# Patient Record
Sex: Female | Born: 1980 | Race: White | Hispanic: No | Marital: Single | Smoking: Current every day smoker
Health system: Southern US, Community
[De-identification: ages and names within clinical notes are randomized; demographics above are authoritative.]

## PROBLEM LIST (undated history)

## (undated) ENCOUNTER — Inpatient Hospital Stay (HOSPITAL_COMMUNITY): Payer: Self-pay

## (undated) DIAGNOSIS — O021 Missed abortion: Principal | ICD-10-CM

## (undated) DIAGNOSIS — R45851 Suicidal ideations: Secondary | ICD-10-CM

## (undated) DIAGNOSIS — I10 Essential (primary) hypertension: Secondary | ICD-10-CM

## (undated) DIAGNOSIS — Z87442 Personal history of urinary calculi: Secondary | ICD-10-CM

## (undated) DIAGNOSIS — R87629 Unspecified abnormal cytological findings in specimens from vagina: Secondary | ICD-10-CM

## (undated) DIAGNOSIS — T8859XA Other complications of anesthesia, initial encounter: Secondary | ICD-10-CM

## (undated) DIAGNOSIS — R51 Headache: Secondary | ICD-10-CM

## (undated) DIAGNOSIS — F32A Depression, unspecified: Secondary | ICD-10-CM

## (undated) DIAGNOSIS — D649 Anemia, unspecified: Secondary | ICD-10-CM

## (undated) DIAGNOSIS — N200 Calculus of kidney: Secondary | ICD-10-CM

## (undated) DIAGNOSIS — J449 Chronic obstructive pulmonary disease, unspecified: Secondary | ICD-10-CM

## (undated) DIAGNOSIS — O139 Gestational [pregnancy-induced] hypertension without significant proteinuria, unspecified trimester: Secondary | ICD-10-CM

## (undated) DIAGNOSIS — M1990 Unspecified osteoarthritis, unspecified site: Secondary | ICD-10-CM

## (undated) DIAGNOSIS — R519 Headache, unspecified: Secondary | ICD-10-CM

## (undated) DIAGNOSIS — K219 Gastro-esophageal reflux disease without esophagitis: Secondary | ICD-10-CM

## (undated) DIAGNOSIS — N83209 Unspecified ovarian cyst, unspecified side: Secondary | ICD-10-CM

## (undated) DIAGNOSIS — J189 Pneumonia, unspecified organism: Secondary | ICD-10-CM

## (undated) DIAGNOSIS — F329 Major depressive disorder, single episode, unspecified: Secondary | ICD-10-CM

## (undated) DIAGNOSIS — F419 Anxiety disorder, unspecified: Secondary | ICD-10-CM

## (undated) HISTORY — PX: TUBAL LIGATION: SHX77

## (undated) HISTORY — PX: TONSILLECTOMY: SUR1361

## (undated) HISTORY — PX: FOOT SURGERY: SHX648

## (undated) HISTORY — PX: DILATION AND CURETTAGE OF UTERUS: SHX78

---
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
## 2002-08-17 ENCOUNTER — Emergency Department (HOSPITAL_COMMUNITY): Admission: EM | Admit: 2002-08-17 | Discharge: 2002-08-17 | Payer: Self-pay | Admitting: Emergency Medicine

## 2003-10-26 ENCOUNTER — Emergency Department (HOSPITAL_COMMUNITY): Admission: EM | Admit: 2003-10-26 | Discharge: 2003-10-26 | Payer: Self-pay | Admitting: Emergency Medicine

## 2004-02-17 ENCOUNTER — Emergency Department (HOSPITAL_COMMUNITY): Admission: EM | Admit: 2004-02-17 | Discharge: 2004-02-17 | Payer: Self-pay | Admitting: Emergency Medicine

## 2004-11-13 ENCOUNTER — Emergency Department (HOSPITAL_COMMUNITY): Admission: EM | Admit: 2004-11-13 | Discharge: 2004-11-13 | Payer: Self-pay | Admitting: Emergency Medicine

## 2006-02-18 ENCOUNTER — Ambulatory Visit: Payer: Self-pay | Admitting: Family Medicine

## 2006-07-14 ENCOUNTER — Emergency Department (HOSPITAL_COMMUNITY): Admission: EM | Admit: 2006-07-14 | Discharge: 2006-07-14 | Payer: Self-pay | Admitting: Emergency Medicine

## 2006-07-16 ENCOUNTER — Ambulatory Visit (HOSPITAL_COMMUNITY): Admission: RE | Admit: 2006-07-16 | Discharge: 2006-07-16 | Payer: Self-pay | Admitting: Obstetrics and Gynecology

## 2007-02-04 ENCOUNTER — Encounter (INDEPENDENT_AMBULATORY_CARE_PROVIDER_SITE_OTHER): Payer: Self-pay | Admitting: *Deleted

## 2007-10-15 ENCOUNTER — Inpatient Hospital Stay (HOSPITAL_COMMUNITY): Admission: AD | Admit: 2007-10-15 | Discharge: 2007-10-15 | Payer: Self-pay | Admitting: Obstetrics and Gynecology

## 2007-12-22 ENCOUNTER — Inpatient Hospital Stay (HOSPITAL_COMMUNITY): Admission: AD | Admit: 2007-12-22 | Discharge: 2007-12-22 | Payer: Self-pay | Admitting: Obstetrics and Gynecology

## 2007-12-28 ENCOUNTER — Inpatient Hospital Stay (HOSPITAL_COMMUNITY): Admission: AD | Admit: 2007-12-28 | Discharge: 2008-01-03 | Payer: Self-pay | Admitting: Obstetrics and Gynecology

## 2007-12-31 ENCOUNTER — Encounter (INDEPENDENT_AMBULATORY_CARE_PROVIDER_SITE_OTHER): Payer: Self-pay | Admitting: Obstetrics and Gynecology

## 2008-07-27 ENCOUNTER — Emergency Department (HOSPITAL_COMMUNITY): Admission: EM | Admit: 2008-07-27 | Discharge: 2008-07-27 | Payer: Self-pay | Admitting: Emergency Medicine

## 2008-09-09 ENCOUNTER — Emergency Department (HOSPITAL_COMMUNITY): Admission: EM | Admit: 2008-09-09 | Discharge: 2008-09-09 | Payer: Self-pay | Admitting: Family Medicine

## 2009-01-11 ENCOUNTER — Emergency Department (HOSPITAL_COMMUNITY): Admission: EM | Admit: 2009-01-11 | Discharge: 2009-01-11 | Payer: Self-pay | Admitting: Family Medicine

## 2009-03-10 ENCOUNTER — Emergency Department (HOSPITAL_COMMUNITY): Admission: EM | Admit: 2009-03-10 | Discharge: 2009-03-10 | Payer: Self-pay | Admitting: Family Medicine

## 2009-05-05 ENCOUNTER — Emergency Department (HOSPITAL_COMMUNITY): Admission: EM | Admit: 2009-05-05 | Discharge: 2009-05-05 | Payer: Self-pay | Admitting: Emergency Medicine

## 2009-05-22 ENCOUNTER — Emergency Department (HOSPITAL_COMMUNITY): Admission: EM | Admit: 2009-05-22 | Discharge: 2009-05-22 | Payer: Self-pay | Admitting: Emergency Medicine

## 2009-07-11 ENCOUNTER — Emergency Department (HOSPITAL_COMMUNITY): Admission: EM | Admit: 2009-07-11 | Discharge: 2009-07-11 | Payer: Self-pay | Admitting: Family Medicine

## 2009-07-17 ENCOUNTER — Emergency Department (HOSPITAL_COMMUNITY): Admission: EM | Admit: 2009-07-17 | Discharge: 2009-07-17 | Payer: Self-pay | Admitting: Emergency Medicine

## 2009-07-28 ENCOUNTER — Emergency Department (HOSPITAL_COMMUNITY): Admission: EM | Admit: 2009-07-28 | Discharge: 2009-07-28 | Payer: Self-pay | Admitting: Emergency Medicine

## 2009-09-20 ENCOUNTER — Emergency Department (HOSPITAL_COMMUNITY): Admission: EM | Admit: 2009-09-20 | Discharge: 2009-09-20 | Payer: Self-pay | Admitting: Emergency Medicine

## 2010-01-01 ENCOUNTER — Emergency Department (HOSPITAL_COMMUNITY): Admission: EM | Admit: 2010-01-01 | Discharge: 2010-01-01 | Payer: Self-pay | Admitting: Emergency Medicine

## 2010-06-05 ENCOUNTER — Emergency Department (HOSPITAL_COMMUNITY)
Admission: EM | Admit: 2010-06-05 | Discharge: 2010-06-05 | Payer: Self-pay | Source: Home / Self Care | Admitting: Family Medicine

## 2010-07-10 ENCOUNTER — Emergency Department (HOSPITAL_COMMUNITY)
Admission: EM | Admit: 2010-07-10 | Discharge: 2010-07-10 | Disposition: A | Discharge status: Home / Self Care | Payer: Self-pay | Attending: Emergency Medicine | Admitting: Emergency Medicine

## 2010-07-10 DIAGNOSIS — R05 Cough: Secondary | ICD-10-CM | POA: Insufficient documentation

## 2010-07-10 DIAGNOSIS — R0602 Shortness of breath: Secondary | ICD-10-CM | POA: Insufficient documentation

## 2010-07-10 DIAGNOSIS — J029 Acute pharyngitis, unspecified: Secondary | ICD-10-CM | POA: Insufficient documentation

## 2010-07-10 DIAGNOSIS — H9209 Otalgia, unspecified ear: Secondary | ICD-10-CM | POA: Insufficient documentation

## 2010-07-10 DIAGNOSIS — R059 Cough, unspecified: Secondary | ICD-10-CM | POA: Insufficient documentation

## 2010-07-10 DIAGNOSIS — J45909 Unspecified asthma, uncomplicated: Secondary | ICD-10-CM | POA: Insufficient documentation

## 2010-07-10 DIAGNOSIS — I10 Essential (primary) hypertension: Secondary | ICD-10-CM | POA: Insufficient documentation

## 2010-07-10 DIAGNOSIS — J069 Acute upper respiratory infection, unspecified: Secondary | ICD-10-CM | POA: Insufficient documentation

## 2010-07-10 DIAGNOSIS — IMO0001 Reserved for inherently not codable concepts without codable children: Secondary | ICD-10-CM | POA: Insufficient documentation

## 2010-07-10 DIAGNOSIS — B9789 Other viral agents as the cause of diseases classified elsewhere: Secondary | ICD-10-CM | POA: Insufficient documentation

## 2010-07-19 ENCOUNTER — Inpatient Hospital Stay (INDEPENDENT_AMBULATORY_CARE_PROVIDER_SITE_OTHER)
Admission: RE | Admit: 2010-07-19 | Discharge: 2010-07-19 | Disposition: A | Discharge status: Home / Self Care | Payer: Self-pay | Source: Ambulatory Visit | Attending: Family Medicine | Admitting: Family Medicine

## 2010-07-19 DIAGNOSIS — J019 Acute sinusitis, unspecified: Secondary | ICD-10-CM

## 2010-08-12 LAB — URINE MICROSCOPIC-ADD ON

## 2010-08-12 LAB — BASIC METABOLIC PANEL
Calcium: 9 mg/dL (ref 8.4–10.5)
Chloride: 105 mEq/L (ref 96–112)
Creatinine, Ser: 0.94 mg/dL (ref 0.4–1.2)
GFR calc Af Amer: >60 mL/min (ref >60)
Glucose, Bld: 98 mg/dL (ref 70–99)
Potassium: 4.1 mEq/L (ref 3.5–5.1)
Sodium: 138 mEq/L (ref 135–145)

## 2010-08-12 LAB — URINALYSIS, ROUTINE W REFLEX MICROSCOPIC
Glucose, UA: NEGATIVE mg/dL
Ketones, ur: NEGATIVE mg/dL
Nitrite: NEGATIVE
Protein, ur: 100 mg/dL — AB
Urobilinogen, UA: 0.2 mg/dL (ref 0.0–1.0)
pH: 7 (ref 5.0–8.0)

## 2010-08-12 LAB — CBC
HCT: 40.4 % (ref 36.0–46.0)
Platelets: 263 10*3/uL (ref 150–400)

## 2010-08-12 LAB — WET PREP, GENITAL
Trich, Wet Prep: NONE SEEN
Yeast Wet Prep HPF POC: NONE SEEN

## 2010-08-12 LAB — DIFFERENTIAL
Basophils Relative: 0 % (ref 0–1)
Eosinophils Absolute: 0.1 10*3/uL (ref 0.0–0.7)
Eosinophils Relative: 0 % (ref 0–5)
Monocytes Absolute: 0.2 10*3/uL (ref 0.1–1.0)
Neutro Abs: 14.6 10*3/uL — ABNORMAL HIGH (ref 1.7–7.7)

## 2010-08-12 LAB — URINE CULTURE

## 2010-08-25 LAB — POCT PREGNANCY, URINE: Preg Test, Ur: NEGATIVE

## 2010-08-29 LAB — POCT I-STAT, CHEM 8
BUN: 14 mg/dL (ref 6–23)
Chloride: 104 mEq/L (ref 96–112)
Creatinine, Ser: 1.2 mg/dL (ref 0.4–1.2)
Glucose, Bld: 83 mg/dL (ref 70–99)
HCT: 48 % — ABNORMAL HIGH (ref 36.0–46.0)
Hemoglobin: 16.3 g/dL — ABNORMAL HIGH (ref 12.0–15.0)
Potassium: 4.2 mEq/L (ref 3.5–5.1)
Sodium: 138 mEq/L (ref 135–145)

## 2010-08-29 LAB — POCT URINALYSIS DIP (DEVICE)
Bilirubin Urine: NEGATIVE
Glucose, UA: NEGATIVE mg/dL
Specific Gravity, Urine: 1.015 (ref 1.005–1.030)
pH: 7 (ref 5.0–8.0)

## 2010-08-29 LAB — URINE CULTURE: Colony Count: >=100000

## 2010-10-02 NOTE — H&P (Signed)
[name], [name]            ACCOUNT NO.:  [id number]   MEDICAL RECORD NO.:  [id number]          PATIENT TYPE:  INP   LOCATION:  9164                          FACILITY:  [facility]   PHYSICIAN:  Charles A. Delcambre, MDDATE OF BIRTH:  20-Mar-1981   DATE OF ADMISSION:  12/28/2007  DATE OF DISCHARGE:                              HISTORY & PHYSICAL   This patient is being admitted to labor and delivery tonight secondary  to pregnancy associated hypertension; persistent headache in the frontal  area; scotomata, yellow as well as black and floating; 6 pounds of  weight gain since the last 5 days; brisk reflexes on the left 3+, right  2+, 1B of clonus on each side.  She did have negative pregnancy induced  hypertension labs on December 21, 2007 with the exception of uric acid of  5.1, AST was 22, ALT was 15, creatinine was 0.9.  Several days prior to  that, uric acid was 6.0, platelets were 242, hemoglobin and hematocrit  was 12.5 and 37.0 at that time, that date being December 21, 2007.  She is  a 30 year old para 2-0-0-1-0, [facility] January 01, 2008, notes active fetal  movement, no rupture of membranes or bleeding.   LABORATORY DATA:  She is O negative, antibody screen negative, VDRL  nonreactive, rubella immune, hepatitis B surface antigen negative, HIV  negative initially as well as at 36 weeks, RPR negative as well at 28  weeks, GC and chlamydia negative initially with Pap smear normal  initially, and GC and chlamydia repeated at 36 weeks negative, 1-hour  Glucola 93, TSH was normal, cystic fibrosis was negative.  She declined  first-trimester screening and quad screen.   PAST MEDICAL HISTORY:  None.   SURGICAL HISTORY:  None.  Spontaneous miscarriage.   MEDICATIONS:  1. Ambien at bedtime frequently.  2. Tylenol frequently.  3. Vicodin at times for headache.   ALLERGIES:  No known drug allergies.   SOCIAL HISTORY:  She is smoking during early part of this pregnancy and  occasionally  smokes in the later part.  She has 1 partner and states she  is monogamous, not married.   FAMILY HISTORY:  Hypertension, diabetes, stroke, breast cancer.   REVIEW OF SYSTEMS:  Denies fever, chills, nausea, vomiting, diarrhea,  constipation, right upper quadrant pain, ruptured membranes.  She notes  active fetal movement.  Denies contractions, occasional sharp pain in  the groin area.  Headache as described above, scotomata as above, and  weight gain as above.   PHYSICAL EXAMINATION:  VITAL SIGNS:  Weight today 200 pounds, blood  pressure 142/90.  She has had associated blood pressures in the 130s/90s  and 140s/90s since December 10, 2007, 36 weeks and 6 days.  LUNGS:  Clear.  HEART:  Regular rate and rhythm, 2/6 systolic ejection murmur, left  sternal border.  ABDOMEN:  Fundal height 40 cm.  Estimated fetal weight 3800 grams.  Cervix is closed, soft, posterior.  Baby is vertex by Leopold's.  Baby  was vertex as well on ultrasound, December 23, 2007.  AFI was normal at  that point, 13.6.  Estimated fetal  weight 3610 or 7 pounds 15 ounces on  December 23, 2007.   ASSESSMENT:  Intrauterine pregnancy at 39 weeks and 3 days, pregnancy  associated hypertension, headaches, scotomata, increased weight gain,  brisk reflexes, edema to the knees, now to be admitted.   PLAN:  Admitted at 9:45 tonight to Maternity to the birthing suites.  PIH panel will be drawn with the admission labs.  We will try induction  with Cytotec 50 mcg every 4-6 hours per protocol and possibly pursue  high-dose Pitocin in the morning, if less than or equal to 2 cm, we will  continue Cytotec in the morning.  Depending on the labs, we will not  initiate magnesium therapy at this time, as she does not meet criteria  for preeclampsia with no protein in the urine.  Clinically, she does  appear to be not headed in the right direction of going further, and for  this reason, we will go ahead and get her induced.  She understands  the  risk of cesarean section with prolonged induction of cervix.  Cervix is  starting to close.  If she were to develop severe symptoms of headache  worse, scotomata worse, right upper quadrant pain, we would pursue  magnesium therapy, but withhold at this time without diagnosis of  preeclampsia upright secondary to no proteinuria.  All questions were  answered.  Ambien tonight 10 mg, if desired.  Epidural when she is 3 cm  p.r.n. otherwise Nubain or Stadol for pain control.  Allow her to eat  breakfast in the morning, if she is not in labor.  All questions were  answered and we will proceed as outlined.      Charles A. Sydnee Cabal, MD  Electronically Signed     CAD/MEDQ  D:  12/28/2007  T:  12/29/2007  Job:  [id number]

## 2010-10-02 NOTE — Discharge Summary (Signed)
[name], [name]            ACCOUNT NO.:  [id number]   MEDICAL RECORD NO.:  [id number]          PATIENT TYPE:  INP   LOCATION:  9131                          [facility]ACILITY:  [facility]   PHYSICIAN:  Charles A. Delcambre, MDDATE OF BIRTH:  [birth year]   DATE OF ADMISSION:  12/28/2007  DATE OF DISCHARGE:  01/03/2008                               DISCHARGE SUMMARY   PRIMARY DISCHARGE DIAGNOSES:  1. Intrauterine pregnancy at 39 weeks and 4 days.  2. Pregnancy associated hypertension not meeting preeclampsia, but      symptomatic.  3. Failed induction.   PROCEDURE:  Primary low transverse cesarean section.   DISPOSITION:  The patient discharged home to follow up in the office in  24 hours to have incision checked and staples discontinued.  She was  given convalesce instructions, call for temperature greater than 100  degrees, incisional erythema or drainage, pus draining from the  incision, increased bleeding, and increased pain.  She is instructed to  not lift over 30 pounds for 4 weeks.  No driving for 2 weeks.  No bath  for 2 weeks, shower okay p.r.n.  Convalesce instructions were given for  rest at home, [facility] precautions were given as well.   History and physical is dictated in the chart.   LABORATORY DATA:  Postoperative hematocrit 31.1 and hemoglobin 10.5.  Baby Rh negative.  Mother did not receive RhoGAM according to the chart.  SGOT, SGPT did return normal during the admission.  Uric acid had gone  from 5.1 to 6.0 and then back down to the 4.0 range.   FINDINGS:  Vigorous female, Apgars 9 and 9, 8 pounds 6 ounces, 20.5  inches, and placenta to Pathology.  Pathology pending at this time.   HOSPITAL COURSE:  The patient was admitted, underwent induction  secondary to headache, 6 pounds of weight gain over 5 days, scotomata  without resolution with p.o. Tylenol Extra Strength.  She had slightly  increased reflexes and in the office 1 beat of clonus, symmetrical,  pitting edema up to  the knees for this reason and that she was beyond 39  weeks.  Counseled the patient that serial induction would likely be  needed.  She was admitted, underwent Cytotec 25 q.4 h., and she  underwent Cervidil and then repeat Cervidil.  She had Pitocin given as  well although while failing to dilate and baby was out of the pelvis.  For this reason with admission blood pressures in the 140-158/98 range  with bedrest of induction, she did have pressures normalized, occasional  130s/90s.  After 3 days of induction, she was counseled for cesarean  section and we did proceed with cesarean section.  Postoperatively, she  had routine postoperative course.  Pain was controlled with Duramorph.  She then went to p.o. medication and had good pain control.  She voided  without difficulty after catheter was discontinued on postoperative day  #1.  Diet was advanced on postoperative day #1.  On postoperative day  #2, she continued to do well.  She had 1 blood pressure 139/94.  On  evening postoperative day #2, she requested  circumcision.  Circumcision  was done with informed  consent.  On postoperative day #3, there was very minimal erythema on  one side of the incision, elected to not treat at this point, but see  her back in 24 hours to discontinue staples and check the incision.  She  was given convalesce instructions as noted above and discharged home  postoperative day #3.      Charles A. Sydnee Cabal, MD  Electronically Signed     CAD/MEDQ  D:  01/03/2008  T:  01/04/2008  Job:  [id number]

## 2010-10-02 NOTE — Op Note (Signed)
[name], JEFCOAT            ACCOUNT NO.:  [id number]   MEDICAL RECORD NO.:  [id number]           PATIENT TYPE:   LOCATION:                                 FACILITY:   PHYSICIAN:  Charles A. Delcambre, MDDATE OF BIRTH:  05/07/1981   DATE OF PROCEDURE:  12/31/2007  DATE OF DISCHARGE:                               OPERATIVE REPORT   PREOPERATIVE DIAGNOSES:  1. Intrauterine pregnancy 39 weeks and 6 days.  2. Pregnancy-associated hypertension not meeting diagnosis of      preeclampsia.  3. Failed induction.   POSTOPERATIVE DIAGNOSES:  1. Intrauterine pregnancy 39 weeks and 6 days.  2. Pregnancy-associated hypertension not meeting diagnosis of      preeclampsia.  3. Failed induction.   PROCEDURE:  Primary low transverse cesarean section.   SURGEON:  Charles A. Delcambre, MD   ASSISTANT:  None.   ANESTHESIA:  Spinal.   OPERATIVE FINDINGS:  Vigorous female, Apgars 9 and 9, 8 pounds 6 ounces,  three-vessel cord.  Clear amniotic fluid.  Placenta to pathology.   ESTIMATED BLOOD LOSS:  500 mL.   COUNTS:  Instrument, sponge, and needle count correct x2.   DESCRIPTION OF PROCEDURE:  The patient was taken to the operating room,  placed in supine position after spinal was injected without difficulty.  With adequate anesthesia, she was sterilely prepped and draped.  Pfannenstiel incision was made to carry down the fascia.  Fascia was  incised with  knife and Mayo scissors.  Rectus sheath was released  superiorly and inferiorly sharply.  Rectus muscles were sharply  dissected in the midline.  Peritoneum was entered with the Metzenbaum  scissors.  Traction was used to extend the incision.  Alexis retractor  was placed.  Hand was swept to indicate no trapped bowel under the  retractor.  The retractor was cinched down and allowed adequate  exposure.  Vesicouterine peritoneum was incised with the Metzenbaum  scissors.  Blunt dissection was used to develop the bladder flap.  Lower  uterine segment transverse incision was made to amniotomy.  There was no  damage to the infant noted.  Fluid was clear.  Vertical traction was  used to extend the incision.  Fundal pressure was applied by the  operator assistant and the baby delivered without difficulty.  The baby  was vigorous, Apgars 9 and 9, and 8 pounds 6 ounces female.  Infant was  shown to the parents and then was taken to the neonatologist attending.  Placenta was manually expressed and sent for cord blood to be drawn for  the cord blood registry.  Uterus was wiped with dry lap after the  placenta was extruded.  Uterus was clear and the uterine incision was  then closed with #1 Vicryl, first layer running nonlocking and second  layer imbricating over the first.  Hemostasis was excellent.  A single  figure-of-eight suture was used to close the oozing just inside the  right angle area of the fascial incision.  Irrigation was carried down  in all layers prior to closing the fascia as well as 1 fascia was  closed.  Subcutaneous hemostasis  was excellent.  Minor electrocautery  was used.  Skin was reapproximated with sterile skin clips.  Hemostasis  was excellent.  Bandage was placed and then she was taken to recovery  with physician in attendance having tolerated the procedure well.      Charles A. Sydnee Cabal, MD  Electronically Signed     CAD/MEDQ  D:  12/31/2007  T:  01/01/2008  Job:  [id number]

## 2010-11-30 ENCOUNTER — Emergency Department (HOSPITAL_COMMUNITY): Payer: Self-pay

## 2010-11-30 ENCOUNTER — Emergency Department (HOSPITAL_COMMUNITY)
Admission: EM | Admit: 2010-11-30 | Discharge: 2010-12-01 | Disposition: A | Discharge status: Home / Self Care | Payer: Self-pay | Attending: Emergency Medicine | Admitting: Emergency Medicine

## 2010-11-30 ENCOUNTER — Inpatient Hospital Stay (INDEPENDENT_AMBULATORY_CARE_PROVIDER_SITE_OTHER)
Admission: RE | Admit: 2010-11-30 | Discharge: 2010-11-30 | Disposition: A | Discharge status: Home / Self Care | Payer: Self-pay | Source: Ambulatory Visit | Attending: Family Medicine | Admitting: Family Medicine

## 2010-11-30 DIAGNOSIS — I10 Essential (primary) hypertension: Secondary | ICD-10-CM | POA: Insufficient documentation

## 2010-11-30 DIAGNOSIS — I498 Other specified cardiac arrhythmias: Secondary | ICD-10-CM | POA: Insufficient documentation

## 2010-11-30 DIAGNOSIS — R10813 Right lower quadrant abdominal tenderness: Secondary | ICD-10-CM

## 2010-11-30 DIAGNOSIS — R10811 Right upper quadrant abdominal tenderness: Secondary | ICD-10-CM | POA: Insufficient documentation

## 2010-11-30 DIAGNOSIS — K8020 Calculus of gallbladder without cholecystitis without obstruction: Secondary | ICD-10-CM | POA: Insufficient documentation

## 2010-11-30 DIAGNOSIS — R1011 Right upper quadrant pain: Secondary | ICD-10-CM | POA: Insufficient documentation

## 2010-11-30 LAB — POCT PREGNANCY, URINE
Preg Test, Ur: NEGATIVE
Preg Test, Ur: NEGATIVE

## 2010-11-30 LAB — COMPREHENSIVE METABOLIC PANEL
Albumin: 3.9 g/dL (ref 3.5–5.2)
Alkaline Phosphatase: 54 U/L (ref 39–117)
BUN: 9 mg/dL (ref 6–23)
Potassium: 3.6 mEq/L (ref 3.5–5.1)
Sodium: 137 mEq/L (ref 135–145)
Total Protein: 7 g/dL (ref 6.0–8.3)

## 2010-11-30 LAB — CBC
MCHC: 35.1 g/dL (ref 30.0–36.0)
RDW: 12.4 % (ref 11.5–15.5)

## 2010-11-30 LAB — URINALYSIS, ROUTINE W REFLEX MICROSCOPIC
Bilirubin Urine: NEGATIVE
Ketones, ur: NEGATIVE mg/dL
Nitrite: NEGATIVE
Urobilinogen, UA: 0.2 mg/dL (ref 0.0–1.0)

## 2010-11-30 LAB — DIFFERENTIAL
Basophils Absolute: 0.1 10*3/uL (ref 0.0–0.1)
Basophils Relative: 1 % (ref 0–1)
Eosinophils Relative: 1 % (ref 0–5)
Monocytes Absolute: 0.5 10*3/uL (ref 0.1–1.0)

## 2010-11-30 LAB — POCT URINALYSIS DIP (DEVICE)
Hgb urine dipstick: NEGATIVE
Ketones, ur: NEGATIVE mg/dL
Nitrite: NEGATIVE
Urobilinogen, UA: 0.2 mg/dL (ref 0.0–1.0)

## 2010-11-30 LAB — LIPASE, BLOOD: Lipase: 18 U/L (ref 11–59)

## 2011-02-15 LAB — CBC
HCT: 37.1
Hemoglobin: 10.5 — ABNORMAL LOW
Hemoglobin: 12.3
Hemoglobin: 12.6
MCHC: 33.6
MCHC: 33.7
MCHC: 33.7
MCV: 93.9
Platelets: 273
RBC: 3.31 — ABNORMAL LOW
RBC: 3.93
RBC: 3.96
RDW: 12.5
RDW: 13.1
RDW: 13.2
WBC: 10.8 — ABNORMAL HIGH

## 2011-02-15 LAB — URIC ACID
Uric Acid, Serum: 4.9
Uric Acid, Serum: 5.5

## 2011-02-15 LAB — CREATININE, SERUM
Creatinine, Ser: 0.81
GFR calc Af Amer: >60
GFR calc non Af Amer: >60

## 2011-02-15 LAB — COMPREHENSIVE METABOLIC PANEL
Alkaline Phosphatase: 143 — ABNORMAL HIGH
BUN: 5 — ABNORMAL LOW
CO2: 23
Chloride: 101
GFR calc non Af Amer: >60
Glucose, Bld: 89
Potassium: 3.8
Total Bilirubin: 0.4
Total Protein: 5.9 — ABNORMAL LOW

## 2011-02-15 LAB — URINALYSIS, ROUTINE W REFLEX MICROSCOPIC
Bilirubin Urine: NEGATIVE
Hgb urine dipstick: NEGATIVE
Ketones, ur: NEGATIVE
Nitrite: NEGATIVE
Protein, ur: NEGATIVE
Urobilinogen, UA: 0.2

## 2011-02-15 LAB — RPR: RPR Ser Ql: NONREACTIVE

## 2011-02-15 LAB — LACTATE DEHYDROGENASE: LDH: 120

## 2011-03-27 ENCOUNTER — Emergency Department (INDEPENDENT_AMBULATORY_CARE_PROVIDER_SITE_OTHER)
Admission: EM | Admit: 2011-03-27 | Discharge: 2011-03-27 | Disposition: A | Discharge status: Home / Self Care | Payer: Self-pay | Source: Home / Self Care | Attending: Family Medicine | Admitting: Family Medicine

## 2011-03-27 DIAGNOSIS — R059 Cough, unspecified: Secondary | ICD-10-CM

## 2011-03-27 DIAGNOSIS — R05 Cough: Secondary | ICD-10-CM

## 2011-03-27 DIAGNOSIS — J069 Acute upper respiratory infection, unspecified: Secondary | ICD-10-CM

## 2011-03-27 HISTORY — DX: Essential (primary) hypertension: I10

## 2011-03-27 MED ORDER — GUAIFENESIN-CODEINE 100-10 MG/5ML PO SYRP: 5.0000 mL | ORAL_SOLUTION | Freq: Three times a day (TID) | ORAL | Status: AC | PRN

## 2011-03-27 MED ORDER — AZITHROMYCIN 250 MG PO TABS: 250.0000 mg | ORAL_TABLET | Freq: Every day | ORAL | Status: AC

## 2011-03-27 NOTE — ED Notes (Signed)
Patient reports she has been sick w a cough for past 3 week, green secretions , chest painful w direct palpation to anterior ribs and worse w deep breath; states her son, her boyfriend and his son have all been ill w URI type syx

## 2011-03-27 NOTE — ED Notes (Signed)
Pt presented w CC short of breath and SOB, hurts to breathe; chest clear to ascultation, NAD, skin w/d/color good , finger tips return to pink briskly upon pressure to check cap refil; advised we will bring her back ASAP, and she is to alert front desk staff if any changes

## 2011-03-27 NOTE — ED Provider Notes (Signed)
History     CSN: [id number] Arrival date & time: 03/27/2011  2:00 PM   First MD Initiated Contact with Patient 03/27/11 1518      Chief Complaint  Patient presents with  . Chest Pain    (Consider location/radiation/quality/duration/timing/severity/associated sxs/prior treatment) Patient is a 30 y.o. female presenting with chest pain. The history is provided by the patient.  Chest Pain The chest pain began 3 - 5 days ago. Chest pain occurs intermittently. The chest pain is unchanged. The pain is associated with coughing, breathing and exertion. The quality of the pain is described as pleuritic and sharp. The pain does not radiate. Chest pain is worsened by deep breathing and exertion. Primary symptoms include a fever, shortness of breath and cough.     Past Medical History  Diagnosis Date  . Asthma   . Hypertension     Past Surgical History  Procedure Date  . Denies     History reviewed. No pertinent family history.  History  Substance Use Topics  . Smoking status: Current Everyday Smoker  . Smokeless tobacco: Not on file  . Alcohol Use: No    OB History    Grav Para Term Preterm Abortions TAB SAB Ect Mult Living                  Review of Systems  Constitutional: Positive for fever and chills.  HENT: Positive for congestion, sore throat and rhinorrhea.   Eyes: Negative.   Respiratory: Positive for cough, chest tightness and shortness of breath.   Cardiovascular: Positive for chest pain.  Genitourinary: Negative.   Musculoskeletal: Negative.     Allergies  Review of patient's allergies indicates no known allergies.  Home Medications   Current Outpatient Rx  Name Route Sig Dispense Refill  . BUPROPION HCL ER (SR) 150 MG PO TB12 Oral Take 150 mg by mouth 2 (two) times daily.      Marland Kitchen FLUOXETINE HCL 20 MG PO CAPS Oral Take 20 mg by mouth daily.        BP 142/65  Pulse 83  Temp(Src) 97.5 F (36.4 C) (Oral)  Resp 20  SpO2 98%  Physical Exam    Constitutional: She appears well-developed and well-nourished.  HENT:  Head: Normocephalic and atraumatic.  Right Ear: Tympanic membrane normal.  Left Ear: Tympanic membrane normal.  Nose: Nose normal.  Mouth/Throat: Uvula is midline. Posterior oropharyngeal erythema present. No oropharyngeal exudate or posterior oropharyngeal edema.  Eyes: EOM are normal.  Neck: Normal range of motion. Neck supple.  Cardiovascular: Normal rate and regular rhythm.   Pulmonary/Chest: Effort normal and breath sounds normal. She has no wheezes. She exhibits tenderness.  Lymphadenopathy:    She has no cervical adenopathy.  Skin: Skin is warm and dry.    ED Course  Procedures (including critical care time)  Labs Reviewed - No data to display No results found.   No diagnosis found.    MDM  Smokes < 1/2 ppd.        Richardo Priest, MD 03/27/11 1535

## 2011-03-28 ENCOUNTER — Telehealth (HOSPITAL_COMMUNITY): Payer: Self-pay | Admitting: *Deleted

## 2011-03-28 NOTE — ED Notes (Signed)
I called pt. and left a message to call. 

## 2012-03-24 DIAGNOSIS — F419 Anxiety disorder, unspecified: Secondary | ICD-10-CM | POA: Insufficient documentation

## 2012-03-24 DIAGNOSIS — J45909 Unspecified asthma, uncomplicated: Secondary | ICD-10-CM | POA: Insufficient documentation

## 2012-06-28 ENCOUNTER — Encounter (HOSPITAL_COMMUNITY): Payer: Self-pay | Admitting: *Deleted

## 2012-06-28 ENCOUNTER — Emergency Department (HOSPITAL_COMMUNITY): Payer: Medicaid Other

## 2012-06-28 ENCOUNTER — Emergency Department (HOSPITAL_COMMUNITY)
Admission: EM | Admit: 2012-06-28 | Discharge: 2012-06-28 | Disposition: A | Discharge status: Home / Self Care | Payer: Medicaid Other | Attending: Emergency Medicine | Admitting: Emergency Medicine

## 2012-06-28 DIAGNOSIS — R059 Cough, unspecified: Secondary | ICD-10-CM | POA: Insufficient documentation

## 2012-06-28 DIAGNOSIS — R091 Pleurisy: Secondary | ICD-10-CM | POA: Insufficient documentation

## 2012-06-28 DIAGNOSIS — R05 Cough: Secondary | ICD-10-CM | POA: Insufficient documentation

## 2012-06-28 DIAGNOSIS — R42 Dizziness and giddiness: Secondary | ICD-10-CM | POA: Insufficient documentation

## 2012-06-28 DIAGNOSIS — F17200 Nicotine dependence, unspecified, uncomplicated: Secondary | ICD-10-CM | POA: Insufficient documentation

## 2012-06-28 DIAGNOSIS — J45909 Unspecified asthma, uncomplicated: Secondary | ICD-10-CM | POA: Insufficient documentation

## 2012-06-28 DIAGNOSIS — R0602 Shortness of breath: Secondary | ICD-10-CM | POA: Insufficient documentation

## 2012-06-28 DIAGNOSIS — Z79899 Other long term (current) drug therapy: Secondary | ICD-10-CM | POA: Insufficient documentation

## 2012-06-28 DIAGNOSIS — I10 Essential (primary) hypertension: Secondary | ICD-10-CM | POA: Insufficient documentation

## 2012-06-28 DIAGNOSIS — J00 Acute nasopharyngitis [common cold]: Secondary | ICD-10-CM | POA: Insufficient documentation

## 2012-06-28 DIAGNOSIS — R110 Nausea: Secondary | ICD-10-CM | POA: Insufficient documentation

## 2012-06-28 DIAGNOSIS — Z3202 Encounter for pregnancy test, result negative: Secondary | ICD-10-CM | POA: Insufficient documentation

## 2012-06-28 LAB — CBC WITH DIFFERENTIAL/PLATELET
Basophils Absolute: 0.1 10*3/uL (ref 0.0–0.1)
Eosinophils Relative: 1 % (ref 0–5)
HCT: 44.4 % (ref 36.0–46.0)
Lymphocytes Relative: 14 % (ref 12–46)
MCHC: 34.2 g/dL (ref 30.0–36.0)
MCV: 92.9 fL (ref 78.0–100.0)
Monocytes Absolute: 1.1 10*3/uL — ABNORMAL HIGH (ref 0.1–1.0)
RDW: 12.7 % (ref 11.5–15.5)

## 2012-06-28 LAB — COMPREHENSIVE METABOLIC PANEL
AST: 22 U/L (ref 0–37)
BUN: 11 mg/dL (ref 6–23)
CO2: 26 mEq/L (ref 19–32)
Calcium: 9.6 mg/dL (ref 8.4–10.5)
Creatinine, Ser: 1.08 mg/dL (ref 0.50–1.10)
GFR calc Af Amer: 80 mL/min — ABNORMAL LOW (ref >90)
GFR calc non Af Amer: 69 mL/min — ABNORMAL LOW (ref >90)

## 2012-06-28 LAB — D-DIMER, QUANTITATIVE: D-Dimer, Quant: 0.31 ug/mL-FEU (ref 0.00–0.48)

## 2012-06-28 LAB — URINALYSIS, ROUTINE W REFLEX MICROSCOPIC
Glucose, UA: NEGATIVE mg/dL
Leukocytes, UA: NEGATIVE
Nitrite: NEGATIVE
Protein, ur: NEGATIVE mg/dL
Urobilinogen, UA: 0.2 mg/dL (ref 0.0–1.0)

## 2012-06-28 LAB — POCT PREGNANCY, URINE: Preg Test, Ur: NEGATIVE

## 2012-06-28 LAB — LIPASE, BLOOD: Lipase: 12 U/L (ref 11–59)

## 2012-06-28 MED ORDER — MORPHINE SULFATE 4 MG/ML IJ SOLN
4.0000 mg | Freq: Once | INTRAMUSCULAR | Status: AC
  Administered 2012-06-28: 4 mg via INTRAVENOUS
  Filled 2012-06-28: qty 1

## 2012-06-28 MED ORDER — ONDANSETRON HCL 4 MG/2ML IJ SOLN
4.0000 mg | Freq: Once | INTRAMUSCULAR | Status: AC
  Administered 2012-06-28: 4 mg via INTRAVENOUS
  Filled 2012-06-28: qty 2

## 2012-06-28 MED ORDER — METHOCARBAMOL 750 MG PO TABS: 750.0000 mg | ORAL_TABLET | Freq: Four times a day (QID) | ORAL | Status: DC

## 2012-06-28 MED ORDER — SODIUM CHLORIDE 0.9 % IV SOLN
INTRAVENOUS | Status: DC
  Administered 2012-06-28: 13:00:00 via INTRAVENOUS

## 2012-06-28 MED ORDER — IBUPROFEN 600 MG PO TABS: 600.0000 mg | ORAL_TABLET | Freq: Four times a day (QID) | ORAL | Status: DC | PRN

## 2012-06-28 MED ORDER — SODIUM CHLORIDE 0.9 % IV SOLN: INTRAVENOUS | Status: DC

## 2012-06-28 NOTE — ED Notes (Signed)
Patient also states she as nausea

## 2012-06-28 NOTE — ED Notes (Signed)
Pt states this morning around 6 am started having central to R sided chest pain, 9/10, and also R sided pain, states she could not get comfortable, any way she moved it hurt, complaining of shortness of breath and dizziness also, denies numbness/tingling/nausea/vomiting or diarrhea.

## 2012-06-28 NOTE — ED Provider Notes (Signed)
History     CSN: [id number]  Arrival date & time 06/28/12  1030   First MD Initiated Contact with Patient 06/28/12 1144      Chief Complaint  Patient presents with  . Chest Pain  . Shortness of Breath    (Consider location/radiation/quality/duration/timing/severity/associated sxs/prior treatment) Patient is a 32 y.o. female presenting with chest pain and shortness of breath. The history is provided by the patient.  Chest Pain Associated symptoms: shortness of breath   Shortness of Breath Associated symptoms: chest pain    patient here with right-sided chest pain which began this morning describes as pleuritic. Pain radiates down to her right lower quadrant. No fever, vomiting, diarrhea. Pain is worse with certain movements. No rashes noted on her chest. No syncope or near-syncope. No anginal type chest pain. No prior history of same. No treatment used prior to arrival. Denies any leg pain or swelling. No recent travel history or oral contraceptive use  Past Medical History  Diagnosis Date  . Asthma   . Hypertension     Past Surgical History  Procedure Laterality Date  . Denies    . Cesarean section      No family history on file.  History  Substance Use Topics  . Smoking status: Current Every Day Smoker  . Smokeless tobacco: Never Used  . Alcohol Use: No    OB History   Grav Para Term Preterm Abortions TAB SAB Ect Mult Living                  Review of Systems  Respiratory: Positive for shortness of breath.   Cardiovascular: Positive for chest pain.  All other systems reviewed and are negative.    Allergies  Review of patient's allergies indicates no known allergies.  Home Medications   Current Outpatient Rx  Name  Route  Sig  Dispense  Refill  . albuterol (PROVENTIL HFA;VENTOLIN HFA) 108 (90 BASE) MCG/ACT inhaler   Inhalation   Inhale 2 puffs into the lungs every 6 (six) hours as needed for wheezing.         . ARIPiprazole (ABILIFY) 5 MG tablet  Oral   Take 5 mg by mouth every evening.         . beclomethasone (QVAR) 80 MCG/ACT inhaler   Inhalation   Inhale 1 puff into the lungs as needed (asthma/ bronchitis).         . Vilazodone HCl (VIIBRYD) 40 MG TABS   Oral   Take 40 mg by mouth every morning.           BP 130/91  Pulse 82  Temp(Src) 99.3 F (37.4 C) (Oral)  Resp 16  SpO2 99%  LMP 06/07/2012  Physical Exam  Nursing note and vitals reviewed. Constitutional: She is oriented to person, place, and time. She appears well-developed and well-nourished.  Non-toxic appearance. No distress.  HENT:  Head: Normocephalic and atraumatic.  Eyes: Conjunctivae, EOM and lids are normal. Pupils are equal, round, and reactive to light.  Neck: Normal range of motion. Neck supple. No tracheal deviation present. No mass present.  Cardiovascular: Normal rate, regular rhythm and normal heart sounds.  Exam reveals no gallop.   No murmur heard. Pulmonary/Chest: Effort normal and breath sounds normal. No stridor. No respiratory distress. She has no decreased breath sounds. She has no wheezes. She has no rhonchi. She has no rales. She exhibits no bony tenderness and no crepitus.  Abdominal: Soft. Normal appearance and bowel sounds are normal. She  exhibits no distension. There is no tenderness. There is no rebound and no CVA tenderness.    Musculoskeletal: Normal range of motion. She exhibits no edema and no tenderness.  Neurological: She is alert and oriented to person, place, and time. She has normal strength. No cranial nerve deficit or sensory deficit. GCS eye subscore is 4. GCS verbal subscore is 5. GCS motor subscore is 6.  Skin: Skin is warm and dry. No abrasion and no rash noted.  Psychiatric: She has a normal mood and affect. Her speech is normal and behavior is normal.    ED Course  Procedures (including critical care time)  Labs Reviewed - No data to display No results found.   No diagnosis found.    MDM  Pt given  pain meds and feels better--no concern for acs or pe--no rashes, pt stable for d/c        Toy Baker, MD 06/28/12 1400

## 2012-06-28 NOTE — ED Notes (Signed)
Patient states that she has had a cold for the past 2 weeks with a lot of coughing. Patients states she has been throwing up yellow-greenish sputum.

## 2012-06-29 LAB — URINE CULTURE

## 2013-03-17 ENCOUNTER — Emergency Department (HOSPITAL_COMMUNITY)
Admission: EM | Admit: 2013-03-17 | Discharge: 2013-03-17 | Discharge status: Against Medical Advice | Payer: Medicaid Other | Attending: Emergency Medicine | Admitting: Emergency Medicine

## 2013-03-17 ENCOUNTER — Encounter (HOSPITAL_COMMUNITY): Payer: Self-pay | Admitting: Emergency Medicine

## 2013-03-17 DIAGNOSIS — R059 Cough, unspecified: Secondary | ICD-10-CM | POA: Insufficient documentation

## 2013-03-17 DIAGNOSIS — R1031 Right lower quadrant pain: Secondary | ICD-10-CM | POA: Insufficient documentation

## 2013-03-17 DIAGNOSIS — I10 Essential (primary) hypertension: Secondary | ICD-10-CM | POA: Insufficient documentation

## 2013-03-17 DIAGNOSIS — F17200 Nicotine dependence, unspecified, uncomplicated: Secondary | ICD-10-CM | POA: Insufficient documentation

## 2013-03-17 DIAGNOSIS — R05 Cough: Secondary | ICD-10-CM | POA: Insufficient documentation

## 2013-03-17 DIAGNOSIS — J45909 Unspecified asthma, uncomplicated: Secondary | ICD-10-CM | POA: Insufficient documentation

## 2013-03-17 HISTORY — DX: Anxiety disorder, unspecified: F41.9

## 2013-03-17 LAB — URINALYSIS, ROUTINE W REFLEX MICROSCOPIC
Leukocytes, UA: NEGATIVE
Nitrite: NEGATIVE
Specific Gravity, Urine: 1.017 (ref 1.005–1.030)
Urobilinogen, UA: 0.2 mg/dL (ref 0.0–1.0)
pH: 6 (ref 5.0–8.0)

## 2013-03-17 LAB — URINE MICROSCOPIC-ADD ON

## 2013-03-17 NOTE — ED Notes (Signed)
Cough sorethroat and aching all over since yesterday

## 2013-03-17 NOTE — ED Notes (Signed)
Pt left because she needed to pick up her son.

## 2013-03-17 NOTE — ED Notes (Signed)
"  I forgot to tell them, I might have a kidney infection. I have been hurting (points to right lower abdomen) x 2 weeks.

## 2013-03-17 NOTE — ED Notes (Signed)
Pt sent back to lobby to wait for acute bed.

## 2013-03-18 ENCOUNTER — Emergency Department (INDEPENDENT_AMBULATORY_CARE_PROVIDER_SITE_OTHER)
Admission: EM | Admit: 2013-03-18 | Discharge: 2013-03-18 | Disposition: A | Discharge status: Home / Self Care | Payer: Medicaid Other | Source: Home / Self Care

## 2013-03-18 ENCOUNTER — Encounter (HOSPITAL_COMMUNITY): Payer: Self-pay | Admitting: Emergency Medicine

## 2013-03-18 DIAGNOSIS — J9801 Acute bronchospasm: Secondary | ICD-10-CM

## 2013-03-18 DIAGNOSIS — Z720 Tobacco use: Secondary | ICD-10-CM

## 2013-03-18 DIAGNOSIS — F17200 Nicotine dependence, unspecified, uncomplicated: Secondary | ICD-10-CM

## 2013-03-18 DIAGNOSIS — J069 Acute upper respiratory infection, unspecified: Secondary | ICD-10-CM

## 2013-03-18 MED ORDER — TRIAMCINOLONE ACETONIDE 40 MG/ML IJ SUSP
40.0000 mg | Freq: Once | INTRAMUSCULAR | Status: AC
  Administered 2013-03-18: 40 mg via INTRAMUSCULAR

## 2013-03-18 MED ORDER — ALBUTEROL SULFATE (5 MG/ML) 0.5% IN NEBU
INHALATION_SOLUTION | RESPIRATORY_TRACT | Status: AC
  Filled 2013-03-18: qty 1

## 2013-03-18 MED ORDER — ALBUTEROL SULFATE (5 MG/ML) 0.5% IN NEBU
5.0000 mg | INHALATION_SOLUTION | Freq: Once | RESPIRATORY_TRACT | Status: AC
  Administered 2013-03-18: 5 mg via RESPIRATORY_TRACT

## 2013-03-18 MED ORDER — ALBUTEROL SULFATE HFA 108 (90 BASE) MCG/ACT IN AERS: 2.0000 | INHALATION_SPRAY | Freq: Four times a day (QID) | RESPIRATORY_TRACT | Status: DC | PRN

## 2013-03-18 MED ORDER — IPRATROPIUM BROMIDE 0.02 % IN SOLN
0.5000 mg | Freq: Once | RESPIRATORY_TRACT | Status: AC
  Administered 2013-03-18: 0.5 mg via RESPIRATORY_TRACT

## 2013-03-18 MED ORDER — IPRATROPIUM BROMIDE 0.02 % IN SOLN
RESPIRATORY_TRACT | Status: AC
  Filled 2013-03-18: qty 2.5

## 2013-03-18 MED ORDER — TRIAMCINOLONE ACETONIDE 40 MG/ML IJ SUSP
INTRAMUSCULAR | Status: AC
  Filled 2013-03-18: qty 1

## 2013-03-18 MED ORDER — METHYLPREDNISOLONE 4 MG PO KIT: PACK | ORAL | Status: DC

## 2013-03-18 MED ORDER — BECLOMETHASONE DIPROPIONATE 80 MCG/ACT IN AERS: 1.0000 | INHALATION_SPRAY | Freq: Two times a day (BID) | RESPIRATORY_TRACT | Status: DC

## 2013-03-18 NOTE — ED Notes (Signed)
Pt  Reports  Symptoms  Of  Congested  Cough          sorethroat  X  3  Days    Pt  Reports  The  Cough  Has  Been  Productive    At  Times    The  Pt  Reports  Symptoms  Have  Been lingering       Since early  This  Am

## 2013-03-18 NOTE — ED Provider Notes (Signed)
CSN: [id number]     Arrival date & time 03/18/13  4098 History   First MD Initiated Contact with Patient 03/18/13 [phone]     Chief Complaint  Patient presents with  . URI   (Consider location/radiation/quality/duration/timing/severity/associated sxs/prior Treatment) HPI Comments: 32 year old female is complaining of body aches, PND, sore throat and cough for 3 days. Is also complaining of tightness in chest with breathing. She has a history of asthma and she is a smoker.   Past Medical History  Diagnosis Date  . Asthma   . Hypertension   . Anxiety    Past Surgical History  Procedure Laterality Date  . Denies    . Cesarean section     No family history on file. History  Substance Use Topics  . Smoking status: Current Every Day Smoker  . Smokeless tobacco: Never Used  . Alcohol Use: No   OB History   Grav Para Term Preterm Abortions TAB SAB Ect Mult Living                 Review of Systems  Constitutional: Positive for activity change. Negative for fever.  HENT: Positive for ear pain, postnasal drip and sore throat. Negative for rhinorrhea and sinus pressure.   Respiratory: Positive for cough, shortness of breath and wheezing.   Cardiovascular: Negative for chest pain and palpitations.  Gastrointestinal: Negative.   Genitourinary: Negative.   Musculoskeletal: Negative.   Neurological: Negative.     Allergies  Review of patient's allergies indicates no known allergies.  Home Medications   Current Outpatient Rx  Name  Route  Sig  Dispense  Refill  . albuterol (PROVENTIL HFA;VENTOLIN HFA) 108 (90 BASE) MCG/ACT inhaler   Inhalation   Inhale 2 puffs into the lungs every 6 (six) hours as needed for wheezing.         Marland Kitchen albuterol (PROVENTIL HFA;VENTOLIN HFA) 108 (90 BASE) MCG/ACT inhaler   Inhalation   Inhale 2 puffs into the lungs every 6 (six) hours as needed for wheezing.   1 Inhaler   0   . ARIPiprazole (ABILIFY) 5 MG tablet   Oral   Take 5 mg by mouth every  evening.         . beclomethasone (QVAR) 80 MCG/ACT inhaler   Inhalation   Inhale 1 puff into the lungs as needed (asthma/ bronchitis).         . beclomethasone (QVAR) 80 MCG/ACT inhaler   Inhalation   Inhale 1 puff into the lungs 2 (two) times daily.   1 Inhaler   1   . ibuprofen (ADVIL,MOTRIN) 600 MG tablet   Oral   Take 1 tablet (600 mg total) by mouth every 6 (six) hours as needed for pain.   30 tablet   0   . methocarbamol (ROBAXIN-750) 750 MG tablet   Oral   Take 1 tablet (750 mg total) by mouth 4 (four) times daily.   30 tablet   0   . methylPREDNISolone (MEDROL DOSEPAK) 4 MG tablet      follow package directions   21 tablet   0   . Vilazodone HCl (VIIBRYD) 40 MG TABS   Oral   Take 40 mg by mouth every morning.          BP 144/94  Pulse 78  Temp(Src) 98.2 F (36.8 C) (Oral)  Resp 16  SpO2 99%  LMP 03/16/2013 Physical Exam  Nursing note and vitals reviewed. Constitutional: She is oriented to person, place, and time. She  appears well-developed and well-nourished. No distress.  HENT:  Right Ear: External ear normal.  Left Ear: External ear normal.  Mouth/Throat: Oropharynx is clear and moist. No oropharyngeal exudate.  Eyes: Conjunctivae and EOM are normal.  Neck: Normal range of motion. Neck supple.  Cardiovascular: Normal rate, regular rhythm and normal heart sounds.   Pulmonary/Chest: Effort normal. No respiratory distress. She has wheezes. She has no rales.  Abdominal: Soft. There is no tenderness.  Musculoskeletal: Normal range of motion. She exhibits no edema.  Lymphadenopathy:    She has no cervical adenopathy.  Neurological: She is alert and oriented to person, place, and time.  Skin: Skin is warm and dry. No rash noted.  Psychiatric: She has a normal mood and affect.    ED Course  Procedures (including critical care time) Labs Review Labs Reviewed - No data to display Imaging Review No results found.    MDM   1. Bronchospasm    2. URI (upper respiratory infection)   3. Tobacco use       1050h :Pt st has modest improvement post duoneb. Persistent diminished airflow and tight expiratory wheezes. Repeat Albuterol neb and administer Kenalog 40 mg IM. 1140h: continued improvement with the 2nd Albuterol Neb. Improved air movement and diminished wheeze.    Continue the albuterol HFA q 4h and the Qvar BID. Medrol dosepack as directed. Start tomorrow. Must stop smoking.  Hayden Rasmussen, NP 03/18/13 [phone]

## 2013-03-21 NOTE — ED Provider Notes (Signed)
Medical screening examination/treatment/procedure(s) were performed by a resident physician or non-physician practitioner and as the supervising physician I was immediately available for consultation/collaboration.  Twan Harkin, MD    Jalynn Waddell S Arlisa Leclere, MD 03/21/13 0859 

## 2013-05-22 ENCOUNTER — Emergency Department (INDEPENDENT_AMBULATORY_CARE_PROVIDER_SITE_OTHER): Payer: Medicaid Other

## 2013-05-22 ENCOUNTER — Emergency Department (INDEPENDENT_AMBULATORY_CARE_PROVIDER_SITE_OTHER)
Admission: EM | Admit: 2013-05-22 | Discharge: 2013-05-22 | Disposition: A | Discharge status: Home / Self Care | Payer: Medicaid Other | Source: Home / Self Care | Attending: Family Medicine | Admitting: Family Medicine

## 2013-05-22 ENCOUNTER — Encounter (HOSPITAL_COMMUNITY): Payer: Self-pay | Admitting: Emergency Medicine

## 2013-05-22 DIAGNOSIS — J111 Influenza due to unidentified influenza virus with other respiratory manifestations: Secondary | ICD-10-CM

## 2013-05-22 LAB — POCT URINALYSIS DIP (DEVICE)
BILIRUBIN URINE: NEGATIVE
GLUCOSE, UA: NEGATIVE mg/dL
HGB URINE DIPSTICK: NEGATIVE
Ketones, ur: NEGATIVE mg/dL
Leukocytes, UA: NEGATIVE
NITRITE: NEGATIVE
PH: 6 (ref 5.0–8.0)
Protein, ur: NEGATIVE mg/dL
SPECIFIC GRAVITY, URINE: 1.02 (ref 1.005–1.030)
Urobilinogen, UA: 0.2 mg/dL (ref 0.0–1.0)

## 2013-05-22 LAB — POCT PREGNANCY, URINE: Preg Test, Ur: NEGATIVE

## 2013-05-22 MED ORDER — ONDANSETRON HCL 4 MG PO TABS: 4.0000 mg | ORAL_TABLET | Freq: Four times a day (QID) | ORAL | Status: DC

## 2013-05-22 MED ORDER — MINOCYCLINE HCL 100 MG PO CAPS: 100.0000 mg | ORAL_CAPSULE | Freq: Two times a day (BID) | ORAL | Status: DC

## 2013-05-22 MED ORDER — DEXTROMETHORPHAN POLISTIREX 30 MG/5ML PO LQCR: 60.0000 mg | Freq: Two times a day (BID) | ORAL | Status: DC

## 2013-05-22 NOTE — ED Notes (Signed)
Pt  Has    Symptoms  Of     Cough  /  Tightness  in  Chest       With  Diarrhea      And  Vomiting  /   r  Sided  Pain      With  Symptoms  X     3  Days         Symptoms  Not  releived  By otc  meds

## 2013-05-22 NOTE — Discharge Instructions (Signed)
Take all of medicine, drink lots of fluids, no more smoking, see your doctor if further problems  °

## 2013-05-22 NOTE — ED Provider Notes (Signed)
CSN: [id number]     Arrival date & time 05/22/13  1210 History   First MD Initiated Contact with Patient 05/22/13 1515     Chief Complaint  Patient presents with  . URI   (Consider location/radiation/quality/duration/timing/severity/associated sxs/prior Treatment) Patient is a 33 y.o. female presenting with URI. The history is provided by the patient.  URI Presenting symptoms: congestion, cough, fever and rhinorrhea   Severity:  Moderate Duration:  4 days Progression:  Worsening Chronicity:  New Ineffective treatments:  OTC medications Risk factors comment:  Smoker   Past Medical History  Diagnosis Date  . Asthma   . Hypertension   . Anxiety    Past Surgical History  Procedure Laterality Date  . Denies    . Cesarean section     History reviewed. No pertinent family history. History  Substance Use Topics  . Smoking status: Current Every Day Smoker  . Smokeless tobacco: Never Used  . Alcohol Use: No   OB History   Grav Para Term Preterm Abortions TAB SAB Ect Mult Living                 Review of Systems  Constitutional: Positive for fever.  HENT: Positive for congestion and rhinorrhea.   Respiratory: Positive for cough.   Cardiovascular: Negative.   Gastrointestinal: Positive for nausea, vomiting and diarrhea. Negative for abdominal pain and blood in stool.    Allergies  Review of patient's allergies indicates no known allergies.  Home Medications   Current Outpatient Rx  Name  Route  Sig  Dispense  Refill  . albuterol (PROVENTIL HFA;VENTOLIN HFA) 108 (90 BASE) MCG/ACT inhaler   Inhalation   Inhale 2 puffs into the lungs every 6 (six) hours as needed for wheezing.         Marland Kitchen. albuterol (PROVENTIL HFA;VENTOLIN HFA) 108 (90 BASE) MCG/ACT inhaler   Inhalation   Inhale 2 puffs into the lungs every 6 (six) hours as needed for wheezing.   1 Inhaler   0   . ARIPiprazole (ABILIFY) 5 MG tablet   Oral   Take 5 mg by mouth every evening.         .  beclomethasone (QVAR) 80 MCG/ACT inhaler   Inhalation   Inhale 1 puff into the lungs as needed (asthma/ bronchitis).         . beclomethasone (QVAR) 80 MCG/ACT inhaler   Inhalation   Inhale 1 puff into the lungs 2 (two) times daily.   1 Inhaler   1   . dextromethorphan (DELSYM) 30 MG/5ML liquid   Oral   Take 10 mLs (60 mg total) by mouth 2 (two) times daily. For cough   89 mL   1   . ibuprofen (ADVIL,MOTRIN) 600 MG tablet   Oral   Take 1 tablet (600 mg total) by mouth every 6 (six) hours as needed for pain.   30 tablet   0   . methocarbamol (ROBAXIN-750) 750 MG tablet   Oral   Take 1 tablet (750 mg total) by mouth 4 (four) times daily.   30 tablet   0   . methylPREDNISolone (MEDROL DOSEPAK) 4 MG tablet      follow package directions   21 tablet   0   . minocycline (MINOCIN,DYNACIN) 100 MG capsule   Oral   Take 1 capsule (100 mg total) by mouth 2 (two) times daily.   14 capsule   0   . ondansetron (ZOFRAN) 4 MG tablet   Oral  Take 1 tablet (4 mg total) by mouth every 6 (six) hours.   6 tablet   0   . Vilazodone HCl (VIIBRYD) 40 MG TABS   Oral   Take 40 mg by mouth every morning.          BP 153/105  Pulse 84  Temp(Src) 99.7 F (37.6 C) (Oral)  Resp 19  SpO2 98%  LMP 05/04/2013 Physical Exam  Nursing note and vitals reviewed. Constitutional: She appears well-developed and well-nourished.  HENT:  Head: Normocephalic.  Right Ear: External ear normal.  Left Ear: External ear normal.  Mouth/Throat: Oropharynx is clear and moist.  Eyes: Conjunctivae are normal. Pupils are equal, round, and reactive to light.  Neck: Normal range of motion. Neck supple.  Pulmonary/Chest: She has wheezes. She has rhonchi.  Abdominal: Soft. Bowel sounds are normal.  Lymphadenopathy:    She has no cervical adenopathy.  Skin: Skin is warm and dry.    ED Course  Procedures (including critical care time) Labs Review Labs Reviewed  POCT URINALYSIS DIP (DEVICE)   POCT PREGNANCY, URINE   Imaging Review Dg Chest 2 View  05/22/2013   CLINICAL DATA:  Cough and fever for 3 days  EXAM: CHEST  2 VIEW  COMPARISON:  06/28/2012  FINDINGS: Hyperinflation suggests COPD. Heart size and vascular pattern are normal. No consolidation or effusion.  IMPRESSION: No active cardiopulmonary disease.   Electronically Signed   By: Esperanza Heir M.D.   On: 05/22/2013 15:33    EKG Interpretation    Date/Time:    Ventricular Rate:    PR Interval:    QRS Duration:   QT Interval:    QTC Calculation:   R Axis:     Text Interpretation:              MDM  X-rays reviewed and report per radiologist.      Linna Hoff, MD 05/22/13 [phone]

## 2013-07-05 ENCOUNTER — Encounter (HOSPITAL_COMMUNITY): Payer: Self-pay | Admitting: Emergency Medicine

## 2013-07-05 ENCOUNTER — Inpatient Hospital Stay (HOSPITAL_COMMUNITY): Payer: Medicaid Other

## 2013-07-05 ENCOUNTER — Other Ambulatory Visit (HOSPITAL_COMMUNITY)
Admission: RE | Admit: 2013-07-05 | Discharge: 2013-07-05 | Disposition: A | Discharge status: Home / Self Care | Payer: Medicaid Other | Source: Ambulatory Visit | Attending: Emergency Medicine | Admitting: Emergency Medicine

## 2013-07-05 ENCOUNTER — Encounter (HOSPITAL_COMMUNITY): Payer: Self-pay | Admitting: *Deleted

## 2013-07-05 ENCOUNTER — Emergency Department (INDEPENDENT_AMBULATORY_CARE_PROVIDER_SITE_OTHER)
Admission: EM | Admit: 2013-07-05 | Discharge: 2013-07-05 | Disposition: A | Discharge status: Home / Self Care | Payer: Medicaid Other | Source: Home / Self Care | Attending: Emergency Medicine | Admitting: Emergency Medicine

## 2013-07-05 ENCOUNTER — Inpatient Hospital Stay (HOSPITAL_COMMUNITY)
Admission: AD | Admit: 2013-07-05 | Discharge: 2013-07-05 | Disposition: A | Discharge status: Home / Self Care | Payer: Medicaid Other | Source: Ambulatory Visit | Attending: Obstetrics & Gynecology | Admitting: Obstetrics & Gynecology

## 2013-07-05 DIAGNOSIS — R109 Unspecified abdominal pain: Secondary | ICD-10-CM

## 2013-07-05 DIAGNOSIS — F411 Generalized anxiety disorder: Secondary | ICD-10-CM | POA: Diagnosis not present

## 2013-07-05 DIAGNOSIS — O9989 Other specified diseases and conditions complicating pregnancy, childbirth and the puerperium: Secondary | ICD-10-CM

## 2013-07-05 DIAGNOSIS — N949 Unspecified condition associated with female genital organs and menstrual cycle: Secondary | ICD-10-CM | POA: Insufficient documentation

## 2013-07-05 DIAGNOSIS — Z79899 Other long term (current) drug therapy: Secondary | ICD-10-CM | POA: Insufficient documentation

## 2013-07-05 DIAGNOSIS — Z87891 Personal history of nicotine dependence: Secondary | ICD-10-CM | POA: Diagnosis not present

## 2013-07-05 DIAGNOSIS — Z113 Encounter for screening for infections with a predominantly sexual mode of transmission: Secondary | ICD-10-CM | POA: Insufficient documentation

## 2013-07-05 DIAGNOSIS — J45909 Unspecified asthma, uncomplicated: Secondary | ICD-10-CM | POA: Diagnosis not present

## 2013-07-05 DIAGNOSIS — O99891 Other specified diseases and conditions complicating pregnancy: Secondary | ICD-10-CM | POA: Diagnosis not present

## 2013-07-05 DIAGNOSIS — N760 Acute vaginitis: Secondary | ICD-10-CM | POA: Insufficient documentation

## 2013-07-05 DIAGNOSIS — R102 Pelvic and perineal pain: Secondary | ICD-10-CM

## 2013-07-05 DIAGNOSIS — O26891 Other specified pregnancy related conditions, first trimester: Secondary | ICD-10-CM

## 2013-07-05 HISTORY — DX: Depression, unspecified: F32.A

## 2013-07-05 HISTORY — DX: Unspecified abnormal cytological findings in specimens from vagina: R87.629

## 2013-07-05 HISTORY — DX: Major depressive disorder, single episode, unspecified: F32.9

## 2013-07-05 HISTORY — DX: Gestational (pregnancy-induced) hypertension without significant proteinuria, unspecified trimester: O13.9

## 2013-07-05 HISTORY — DX: Calculus of kidney: N20.0

## 2013-07-05 HISTORY — DX: Unspecified ovarian cyst, unspecified side: N83.209

## 2013-07-05 LAB — POCT URINALYSIS DIP (DEVICE)
Bilirubin Urine: NEGATIVE
GLUCOSE, UA: NEGATIVE mg/dL
Ketones, ur: NEGATIVE mg/dL
Leukocytes, UA: NEGATIVE
NITRITE: NEGATIVE
PH: 6 (ref 5.0–8.0)
Protein, ur: 30 mg/dL — AB
Urobilinogen, UA: 0.2 mg/dL (ref 0.0–1.0)

## 2013-07-05 LAB — CBC WITH DIFFERENTIAL/PLATELET
BASOS PCT: 1 % (ref 0–1)
Basophils Absolute: 0.1 10*3/uL (ref 0.0–0.1)
Eosinophils Absolute: 0.1 10*3/uL (ref 0.0–0.7)
Eosinophils Relative: 1 % (ref 0–5)
HEMATOCRIT: 45.2 % (ref 36.0–46.0)
HEMOGLOBIN: 16 g/dL — AB (ref 12.0–15.0)
Lymphocytes Relative: 24 % (ref 12–46)
Lymphs Abs: 2.1 10*3/uL (ref 0.7–4.0)
MCH: 33.5 pg (ref 26.0–34.0)
MCHC: 35.4 g/dL (ref 30.0–36.0)
MCV: 94.6 fL (ref 78.0–100.0)
MONO ABS: 0.8 10*3/uL (ref 0.1–1.0)
MONOS PCT: 9 % (ref 3–12)
NEUTROS ABS: 5.7 10*3/uL (ref 1.7–7.7)
Neutrophils Relative %: 65 % (ref 43–77)
Platelets: 330 10*3/uL (ref 150–400)
RBC: 4.78 MIL/uL (ref 3.87–5.11)
RDW: 12.6 % (ref 11.5–15.5)
WBC: 8.8 10*3/uL (ref 4.0–10.5)

## 2013-07-05 LAB — POCT I-STAT, CHEM 8
BUN: 11 mg/dL (ref 6–23)
CALCIUM ION: 1.3 mmol/L — AB (ref 1.12–1.23)
Chloride: 102 mEq/L (ref 96–112)
Creatinine, Ser: 1 mg/dL (ref 0.50–1.10)
Glucose, Bld: 77 mg/dL (ref 70–99)
HCT: 52 % — ABNORMAL HIGH (ref 36.0–46.0)
HEMOGLOBIN: 17.7 g/dL — AB (ref 12.0–15.0)
Potassium: 3.8 mEq/L (ref 3.7–5.3)
SODIUM: 140 meq/L (ref 137–147)
TCO2: 25 mmol/L (ref 0–100)

## 2013-07-05 LAB — CERVICOVAGINAL ANCILLARY ONLY
Wet Prep (BD Affirm): NEGATIVE
Wet Prep (BD Affirm): NEGATIVE
Wet Prep (BD Affirm): NEGATIVE

## 2013-07-05 LAB — HCG, QUANTITATIVE, PREGNANCY: HCG, BETA CHAIN, QUANT, S: 8358 m[IU]/mL — AB (ref <5)

## 2013-07-05 LAB — POCT PREGNANCY, URINE: Preg Test, Ur: POSITIVE — AB

## 2013-07-05 MED ORDER — ONDANSETRON HCL 8 MG PO TABS: 8.0000 mg | ORAL_TABLET | Freq: Three times a day (TID) | ORAL | Status: DC | PRN

## 2013-07-05 MED ORDER — CEPHALEXIN 500 MG PO CAPS: 500.0000 mg | ORAL_CAPSULE | Freq: Three times a day (TID) | ORAL | Status: DC

## 2013-07-05 NOTE — ED Notes (Signed)
C/o abdominal cramping since 11 pm last night.  Denies n/v/d.  Pt  Has taken nine pregnancy test and all were positive.  Denies bleeding or spotting.

## 2013-07-05 NOTE — MAU Note (Signed)
Unsure of LMP, thinks mid Jan

## 2013-07-05 NOTE — Discharge Instructions (Signed)
Pregnancy - First Trimester  During sexual intercourse, millions of sperm go into the vagina. Only 1 sperm will penetrate and fertilize the female egg while it is in the Fallopian tube. One week later, the fertilized egg implants into the wall of the uterus. An embryo begins to develop into a baby. At 6 to 8 weeks, the eyes and face are formed and the heartbeat can be seen on ultrasound. At the end of 12 weeks (first trimester), all the baby's organs are formed. Now that you are pregnant, you will want to do everything you can to have a healthy baby. Two of the most important things are to get good prenatal care and follow your caregiver's instructions. Prenatal care is all the medical care you receive before the baby's birth. It is given to prevent, find, and treat problems during the pregnancy and childbirth.  PRENATAL EXAMS  · During prenatal visits, your weight, blood pressure, and urine are checked. This is done to make sure you are healthy and progressing normally during the pregnancy.  · A pregnant woman should gain 25 to 35 pounds during the pregnancy. However, if you are overweight or underweight, your caregiver will advise you regarding your weight.  · Your caregiver will ask and answer questions for you.  · Blood work, cervical cultures, other necessary tests, and a Pap test are done during your prenatal exams. These tests are done to check on your health and the probable health of your baby. Tests are strongly recommended and done for HIV with your permission. This is the virus that causes AIDS. These tests are done because medicines can be given to help prevent your baby from being born with this infection should you have been infected without knowing it. Blood work is also used to find out your blood type, previous infections, and follow your blood levels (hemoglobin).  · Low hemoglobin (anemia) is common during pregnancy. Iron and vitamins are given to help prevent this. Later in the pregnancy, blood  tests for diabetes will be done along with any other tests if any problems develop.  · You may need other tests to make sure you and the baby are doing well.  CHANGES DURING THE FIRST TRIMESTER   Your body goes through many changes during pregnancy. They vary from person to person. Talk to your caregiver about changes you notice and are concerned about. Changes can include:  · Your menstrual period stops.  · The egg and sperm carry the genes that determine what you look like. Genes from you and your partner are forming a baby. The female genes determine whether the baby is a boy or a girl.  · Your body increases in girth and you may feel bloated.  · Feeling sick to your stomach (nauseous) and throwing up (vomiting). If the vomiting is uncontrollable, call your caregiver.  · Your breasts will begin to enlarge and become tender.  · Your nipples may stick out more and become darker.  · The need to urinate more. Painful urination may mean you have a bladder infection.  · Tiring easily.  · Loss of appetite.  · Cravings for certain kinds of food.  · At first, you may gain or lose a couple of pounds.  · You may have changes in your emotions from day to day (excited to be pregnant or concerned something may go wrong with the pregnancy and baby).  · You may have more vivid and strange dreams.  HOME CARE INSTRUCTIONS   ·   It is very important to avoid all smoking, alcohol and non-prescribed drugs during your pregnancy. These affect the formation and growth of the baby. Avoid chemicals while pregnant to ensure the delivery of a healthy infant.  · Start your prenatal visits by the 12th week of pregnancy. They are usually scheduled monthly at first, then more often in the last 2 months before delivery. Keep your caregiver's appointments. Follow your caregiver's instructions regarding medicine use, blood and lab tests, exercise, and diet.  · During pregnancy, you are providing food for you and your baby. Eat regular, well-balanced  meals. Choose foods such as meat, fish, milk and other low fat dairy products, vegetables, fruits, and whole-grain breads and cereals. Your caregiver will tell you of the ideal weight gain.  · You can help morning sickness by keeping soda crackers at the bedside. Eat a couple before arising in the morning. You may want to use the crackers without salt on them.  · Eating 4 to 5 small meals rather than 3 large meals a day also may help the nausea and vomiting.  · Drinking liquids between meals instead of during meals also seems to help nausea and vomiting.  · A physical sexual relationship may be continued throughout pregnancy if there are no other problems. Problems may be early (premature) leaking of amniotic fluid from the membranes, vaginal bleeding, or belly (abdominal) pain.  · Exercise regularly if there are no restrictions. Check with your caregiver or physical therapist if you are unsure of the safety of some of your exercises. Greater weight gain will occur in the last 2 trimesters of pregnancy. Exercising will help:  · Control your weight.  · Keep you in shape.  · Prepare you for labor and delivery.  · Help you lose your pregnancy weight after you deliver your baby.  · Wear a good support or jogging bra for breast tenderness during pregnancy. This may help if worn during sleep too.  · Ask when prenatal classes are available. Begin classes when they are offered.  · Do not use hot tubs, steam rooms, or saunas.  · Wear your seat belt when driving. This protects you and your baby if you are in an accident.  · Avoid raw meat, uncooked cheese, cat litter boxes, and soil used by cats throughout the pregnancy. These carry germs that can cause birth defects in the baby.  · The first trimester is a good time to visit your dentist for your dental health. Getting your teeth cleaned is okay. Use a softer toothbrush and brush gently during pregnancy.  · Ask for help if you have financial, counseling, or nutritional needs  during pregnancy. Your caregiver will be able to offer counseling for these needs as well as refer you for other special needs.  · Do not take any medicines or herbs unless told by your caregiver.  · Inform your caregiver if there is any mental or physical domestic violence.  · Make a list of emergency phone numbers of family, friends, hospital, and police and fire departments.  · Write down your questions. Take them to your prenatal visit.  · Do not douche.  · Do not cross your legs.  · If you have to stand for long periods of time, rotate you feet or take small steps in a circle.  · You may have more vaginal secretions that may require a sanitary pad. Do not use tampons or scented sanitary pads.  MEDICINES AND DRUG USE IN PREGNANCY  ·   Take prenatal vitamins as directed. The vitamin should contain 1 milligram of folic acid. Keep all vitamins out of reach of children. Only a couple vitamins or tablets containing iron may be fatal to a baby or young child when ingested.  · Avoid use of all medicines, including herbs, over-the-counter medicines, not prescribed or suggested by your caregiver. Only take over-the-counter or prescription medicines for pain, discomfort, or fever as directed by your caregiver. Do not use aspirin, ibuprofen, or naproxen unless directed by your caregiver.  · Let your caregiver also know about herbs you may be using.  · Alcohol is related to a number of birth defects. This includes fetal alcohol syndrome. All alcohol, in any form, should be avoided completely. Smoking will cause low birth rate and premature babies.  · Street or illegal drugs are very harmful to the baby. They are absolutely forbidden. A baby born to an addicted mother will be addicted at birth. The baby will go through the same withdrawal an adult does.  · Let your caregiver know about any medicines that you have to take and for what reason you take them.  SEEK MEDICAL CARE IF:   You have any concerns or worries during your  pregnancy. It is better to call with your questions if you feel they cannot wait, rather than worry about them.  SEEK IMMEDIATE MEDICAL CARE IF:   · An unexplained oral temperature above 102° F (38.9° C) develops, or as your caregiver suggests.  · You have leaking of fluid from the vagina (birth canal). If leaking membranes are suspected, take your temperature and inform your caregiver of this when you call.  · There is vaginal spotting or bleeding. Notify your caregiver of the amount and how many pads are used.  · You develop a bad smelling vaginal discharge with a change in the color.  · You continue to feel sick to your stomach (nauseated) and have no relief from remedies suggested. You vomit blood or coffee ground-like materials.  · You lose more than 2 pounds of weight in 1 week.  · You gain more than 2 pounds of weight in 1 week and you notice swelling of your face, hands, feet, or legs.  · You gain 5 pounds or more in 1 week (even if you do not have swelling of your hands, face, legs, or feet).  · You get exposed to German measles and have never had them.  · You are exposed to fifth disease or chickenpox.  · You develop belly (abdominal) pain. Round ligament discomfort is a common non-cancerous (benign) cause of abdominal pain in pregnancy. Your caregiver still must evaluate this.  · You develop headache, fever, diarrhea, pain with urination, or shortness of breath.  · You fall or are in a car accident or have any kind of trauma.  · There is mental or physical violence in your home.  Document Released: 04/30/2001 Document Revised: 01/29/2012 Document Reviewed: 11/01/2008  [employer]® Patient Information ©2014 [employer].

## 2013-07-05 NOTE — MAU Provider Note (Signed)
Attestation of Attending Supervision of Advanced Practitioner (CNM/NP): Evaluation and management procedures were performed by the Advanced Practitioner under my supervision and collaboration.  I have reviewed the Advanced Practitioner's note and chart, and I agree with the management and plan.  HARRAWAY-SMITH, Raphaella Larkin 9:06 PM

## 2013-07-05 NOTE — MAU Note (Signed)
Found out on Sat, she was preg.  Cramping started last night, comes and goes.

## 2013-07-05 NOTE — ED Provider Notes (Signed)
Chief Complaint   Chief Complaint  Patient presents with  . Abdominal Cramping    History of Present Illness   [name] is a 33 year old female who has a history since last night of severe, sharp bilateral flank pain. This began right and then moved to the left, the pain is still worse on the right. The pain comes and goes in episodes can last up to 5 minutes at a time. It is rated 9/10 in intensity. It's worse if she bends, and if she rides in a car going over bumps. She's felt somewhat chilled and anorexic. Her last menstrual period began in early January. She's not had any menses so far this month. She took several pregnancy tests at home which were positive. She denies any fever, nausea, vomiting, diarrhea, constipation, hematochezia, melena, or urinary symptoms. She's had no vaginal discharge or bleeding.  Review of Systems   Other than as noted above, the patient denies any of the following symptoms: Constitutional:  No fever, chills, weight loss or anorexia. Lungs:  No cough or shortness of breath. Heart:  No chest pain, palpitations, syncope or edema.  Abdomen:  No nausea, vomiting, hematememesis, melena, diarrhea, or hematochezia. GU:  No dysuria, frequency, urgency, or hematuria. Gyn:  No vaginal discharge, itching, abnormal bleeding, dyspareunia, or pelvic pain.  PMFSH   Past medical history, family history, social history, meds, and allergies were reviewed. She has a history of high blood pressure.  Physical Exam     Vital signs:  BP 133/86  Pulse 64  Temp(Src) 98.8 F (37.1 C) (Oral)  Resp 16  SpO2 100%  LMP 05/04/2013 Gen:  Alert, oriented, in no distress. Lungs:  Breath sounds clear and equal bilaterally.  No wheezes, rales or rhonchi. Heart:  Regular rhythm.  No gallops or murmers.   Abdomen:  Soft, flat, nondistended. Bowel sounds are present. No organomegaly or mass. There is tenderness to palpation in the epigastrium, periumbilical area, and both  flank areas, but none in the right lower quadrant. She has mild pain in the left lower quadrant. Pelvic:  Normal external genitalia. Vaginal and cervical mucosa were normal. There was no discharge and no bleeding. She has moderate cervical motion pain. Uterus was normal in size and moderately tender, she has moderate bilateral adnexal tenderness with no mass.  DNA probes for gonorrhea, Chlamydia, Trichomonas, Gardnerella, and Candida were obtained. Skin:  Clear, warm and dry.  No rash.  Labs   Results for orders placed during the hospital encounter of 07/05/13  CBC WITH DIFFERENTIAL      Result Value Ref Range   WBC 8.8  4.0 - 10.5 K/uL   RBC 4.78  3.87 - 5.11 MIL/uL   Hemoglobin 16.0 (*) 12.0 - 15.0 g/dL   HCT 95.645.2  21.336.0 - 08.646.0 %   MCV 94.6  78.0 - 100.0 fL   MCH 33.5  26.0 - 34.0 pg   MCHC 35.4  30.0 - 36.0 g/dL   RDW 57.812.6  46.911.5 - 62.915.5 %   Platelets 330  150 - 400 K/uL   Neutrophils Relative % 65  43 - 77 %   Neutro Abs 5.7  1.7 - 7.7 K/uL   Lymphocytes Relative 24  12 - 46 %   Lymphs Abs 2.1  0.7 - 4.0 K/uL   Monocytes Relative 9  3 - 12 %   Monocytes Absolute 0.8  0.1 - 1.0 K/uL   Eosinophils Relative 1  0 - 5 %  Eosinophils Absolute 0.1  0.0 - 0.7 K/uL   Basophils Relative 1  0 - 1 %   Basophils Absolute 0.1  0.0 - 0.1 K/uL  POCT URINALYSIS DIP (DEVICE)      Result Value Ref Range   Glucose, UA NEGATIVE  NEGATIVE mg/dL   Bilirubin Urine NEGATIVE  NEGATIVE   Ketones, ur NEGATIVE  NEGATIVE mg/dL   Specific Gravity, Urine >=1.030  1.005 - 1.030   Hgb urine dipstick MODERATE (*) NEGATIVE   pH 6.0  5.0 - 8.0   Protein, ur 30 (*) NEGATIVE mg/dL   Urobilinogen, UA 0.2  0.0 - 1.0 mg/dL   Nitrite NEGATIVE  NEGATIVE   Leukocytes, UA NEGATIVE  NEGATIVE  POCT PREGNANCY, URINE      Result Value Ref Range   Preg Test, Ur POSITIVE (*) NEGATIVE  POCT I-STAT, CHEM 8      Result Value Ref Range   Sodium 140  137 - 147 mEq/L   Potassium 3.8  3.7 - 5.3 mEq/L   Chloride 102  96 - 112  mEq/L   BUN 11  6 - 23 mg/dL   Creatinine, Ser 1.61  0.50 - 1.10 mg/dL   Glucose, Bld 77  70 - 99 mg/dL   Calcium, Ion 0.96 (*) 1.12 - 1.23 mmol/L   TCO2 25  0 - 100 mmol/L   Hemoglobin 17.7 (*) 12.0 - 15.0 g/dL   HCT 04.5 (*) 40.9 - 81.1 %    The urine was cultured.  Assessment   The encounter diagnosis was Abdominal pain.  Differential diagnosis includes viral gastroenteritis, urinary tract infection, kidney stone, or ectopic pregnancy. She will need to go to [facility] hospital for a pelvic ultrasound tonight.  Plan     1.  Meds:  The following meds were prescribed:   Discharge Medication List as of 07/05/2013  4:49 PM    START taking these medications   Details  cephALEXin (KEFLEX) 500 MG capsule Take 1 capsule (500 mg total) by mouth 3 (three) times daily., Starting 07/05/2013, Until Discontinued, Normal    !! ondansetron (ZOFRAN) 8 MG tablet Take 1 tablet (8 mg total) by mouth every 8 (eight) hours as needed for nausea or vomiting., Starting 07/05/2013, Until Discontinued, Normal     !! - Potential duplicate medications found. Please discuss with provider.      2.  Patient Education/Counseling:  The patient was given appropriate handouts, self care instructions, and instructed in symptomatic relief.  Her boyfriend is with her today and [facility] She was told not to the recovery thing on her way over.  3.  Follow up:  The patient was told to follow up here if no better in 3 to 4 days, or sooner if becoming worse in any way, and given some red flag symptoms such as worsening pain, fever, vomiting, or evidence of GI bleeding which would prompt immediate return.  Follow up here as necessary.    Reuben Likes, MD 07/05/13 [phone]

## 2013-07-05 NOTE — Discharge Instructions (Signed)
Abdominal Pain During Pregnancy °Abdominal pain is common in pregnancy. Most of the time, it does not cause harm. There are many causes of abdominal pain. Some causes are more serious than others. Some of the causes of abdominal pain in pregnancy are easily diagnosed. Occasionally, the diagnosis takes time to understand. Other times, the cause is not determined. Abdominal pain can be a sign that something is very wrong with the pregnancy, or the pain may have nothing to do with the pregnancy at all. For this reason, always tell your health care provider if you have any abdominal discomfort. °HOME CARE INSTRUCTIONS  °Monitor your abdominal pain for any changes. The following actions may help to alleviate any discomfort you are experiencing: °· Do not have sexual intercourse or put anything in your vagina until your symptoms go away completely. °· Get plenty of rest until your pain improves. °· Drink clear fluids if you feel nauseous. Avoid solid food as long as you are uncomfortable or nauseous. °· Only take over-the-counter or prescription medicine as directed by your health care provider. °· Keep all follow-up appointments with your health care provider. °SEEK IMMEDIATE MEDICAL CARE IF: °· You are bleeding, leaking fluid, or passing tissue from the vagina. °· You have increasing pain or cramping. °· You have persistent vomiting. °· You have painful or bloody urination. °· You have a fever. °· You notice a decrease in your baby's movements. °· You have extreme weakness or feel faint. °· You have shortness of breath, with or without abdominal pain. °· You develop a severe headache with abdominal pain. °· You have abnormal vaginal discharge with abdominal pain. °· You have persistent diarrhea. °· You have abdominal pain that continues even after rest, or gets worse. °MAKE SURE YOU:  °· Understand these instructions. °· Will watch your condition. °· Will get help right away if you are not doing well or get  worse. °Document Released: 05/06/2005 Document Revised: 02/24/2013 Document Reviewed: 12/03/2012 °ExitCare® Patient Information ©2014 [employer]. ° °

## 2013-07-05 NOTE — MAU Provider Note (Signed)
History     CSN: [id number]  Arrival date and time: 07/05/13 1711   First Provider Initiated Contact with Patient 07/05/13 1858      Chief Complaint  Patient presents with  . Abdominal Cramping   HPI Ms. TERRA AVENI is a 33 y.o. G3P1011 at [redacted]w[redacted]d who presents to MAU today from Urgent Care for lower abdominal cramping since last night. The patient denies vaginal bleeding, discharge, fever, dizziness or weakness. The patient endorses mild dysuria and fatigue. Patient is unsure of LMP, maybe mid-January.    OB History   Grav Para Term Preterm Abortions TAB SAB Ect Mult Living   3 1 1  0 1 0 1 0 0 1      Past Medical History  Diagnosis Date  . Asthma   . Hypertension   . Anxiety   . Pregnancy induced hypertension   . Kidney stones   . Ovarian cyst   . Vaginal Pap smear, abnormal     bx, f/u ok  . Depression     doing good, not on meds now    Past Surgical History  Procedure Laterality Date  . Denies    . Cesarean section    . Dilation and curettage of uterus      Family History  Problem Relation Age of Onset  . Hearing loss Mother   . Diabetes Son   . Cancer Maternal Grandfather     lung  . Liver disease Maternal Grandfather   . Cancer Paternal Grandmother     History  Substance Use Topics  . Smoking status: Former Smoker    Types: Cigarettes  . Smokeless tobacco: Never Used     Comment: quit with positive preg test  . Alcohol Use: No    Allergies: No Known Allergies  Prescriptions prior to admission  Medication Sig Dispense Refill  . albuterol (PROVENTIL HFA;VENTOLIN HFA) 108 (90 BASE) MCG/ACT inhaler Inhale 2 puffs into the lungs every 6 (six) hours as needed for wheezing.      Marland Kitchen albuterol (PROVENTIL HFA;VENTOLIN HFA) 108 (90 BASE) MCG/ACT inhaler Inhale 2 puffs into the lungs every 6 (six) hours as needed for wheezing.  1 Inhaler  0  . ARIPiprazole (ABILIFY) 5 MG tablet Take 5 mg by mouth every evening.      . beclomethasone (QVAR) 80 MCG/ACT  inhaler Inhale 1 puff into the lungs as needed (asthma/ bronchitis).      . beclomethasone (QVAR) 80 MCG/ACT inhaler Inhale 1 puff into the lungs 2 (two) times daily.  1 Inhaler  1  . cephALEXin (KEFLEX) 500 MG capsule Take 1 capsule (500 mg total) by mouth 3 (three) times daily.  30 capsule  0  . dextromethorphan (DELSYM) 30 MG/5ML liquid Take 10 mLs (60 mg total) by mouth 2 (two) times daily. For cough  89 mL  1  . ibuprofen (ADVIL,MOTRIN) 600 MG tablet Take 1 tablet (600 mg total) by mouth every 6 (six) hours as needed for pain.  30 tablet  0  . methocarbamol (ROBAXIN-750) 750 MG tablet Take 1 tablet (750 mg total) by mouth 4 (four) times daily.  30 tablet  0  . methylPREDNISolone (MEDROL DOSEPAK) 4 MG tablet follow package directions  21 tablet  0  . minocycline (MINOCIN,DYNACIN) 100 MG capsule Take 1 capsule (100 mg total) by mouth 2 (two) times daily.  14 capsule  0  . ondansetron (ZOFRAN) 4 MG tablet Take 1 tablet (4 mg total) by mouth every 6 (six) hours.  6 tablet  0  . ondansetron (ZOFRAN) 8 MG tablet Take 1 tablet (8 mg total) by mouth every 8 (eight) hours as needed for nausea or vomiting.  12 tablet  0  . Vilazodone HCl (VIIBRYD) 40 MG TABS Take 40 mg by mouth every morning.        Review of Systems  Constitutional: Positive for malaise/fatigue. Negative for fever.  Gastrointestinal: Positive for abdominal pain. Negative for nausea, vomiting, diarrhea and constipation.  Genitourinary: Positive for dysuria. Negative for urgency and frequency.       Neg - vaginal bleeding, discharge  Neurological: Negative for dizziness, loss of consciousness and weakness.   Physical Exam   Blood pressure 153/91, pulse 69, temperature 98.6 F (37 C), temperature source Oral, resp. rate 18, height 5' 2.5" (1.588 m), weight 157 lb (71.215 kg), last menstrual period 06/06/2013.  Physical Exam  Constitutional: She is oriented to person, place, and time. She appears well-developed and well-nourished.  No distress.  HENT:  Head: Normocephalic and atraumatic.  Cardiovascular: Normal rate, regular rhythm and normal heart sounds.   Respiratory: Effort normal and breath sounds normal. No respiratory distress.  GI: Soft. Bowel sounds are normal. She exhibits no distension and no mass. There is tenderness (mild tenderness to palpation of the LLQ). There is no rebound, no guarding and no CVA tenderness.  Neurological: She is alert and oriented to person, place, and time.  Skin: Skin is warm and dry. No erythema.  Psychiatric: She has a normal mood and affect.  Pelvic: deferred. Patient had pelvic exam at urgent care  Results for orders placed during the hospital encounter of 07/05/13 (from the past 24 hour(s))  HCG, QUANTITATIVE, PREGNANCY     Status: Abnormal   Collection Time    07/05/13  6:06 PM      Result Value Ref Range   hCG, Beta Chain, Quant, S 8358 (*) <5 mIU/mL   [place] Ob Comp Less 14 Wks  07/05/2013   CLINICAL DATA:  Lower abdominal pain, early pregnancy  EXAM: OBSTETRIC <14 WK US AND TRANSVAGINAL OB US  TECHNIQUE: Both transabdominal and transvaginal ultrasound examinations were performed for complete evaluation of the gestation as well as the maternal uterus, adnexal regions, and pelvic cul-de-sac. Transvaginal technique was performed to assess early pregnancy.  COMPARISON:  None.  FINDINGS: Intrauterine gestational sac: Single.  Normal contour  Yolk sac:  Present  Embryo:  None visible  Cardiac Activity: None detected.  MSD:  9.8  mm   5 w   5  d  Maternal uterus/adnexae: The appearance of the uterus is normal. No adnexal abnormalities are demonstrated. There is no free pelvic fluid.  IMPRESSION: 1. There is a tiny fluid-filled sac within the endometrial cavity. A tiny yolk sac is demonstrated. No embryo is demonstrated. There is no subchorionic hemorrhage. 2. The maternal uterus and adnexal structures otherwise are normal in appearance. 3. Serial follow-up beta HCG determinations and  follow-up ultrasound examinations are recommended.   Electronically Signed   By: David  [place]   On: 07/05/2013 19:43   [place] Ob Transvaginal  07/05/2013   CLINICAL DATA:  Lower abdominal pain, early pregnancy  EXAM: OBSTETRIC <14 WK US AND TRANSVAGINAL OB US  TECHNIQUE: Both transabdominal and transvaginal ultrasound examinations were performed for complete evaluation of the gestation as well as the maternal uterus, adnexal regions, and pelvic cul-de-sac. Transvaginal technique was performed to assess early pregnancy.  COMPARISON:  None.  FINDINGS: Intrauterine gestational sac: Single.  Normal contour  Yolk sac:  Present  Embryo:  None visible  Cardiac Activity: None detected.  MSD:  9.8  mm   5 w   5  d  Maternal uterus/adnexae: The appearance of the uterus is normal. No adnexal abnormalities are demonstrated. There is no free pelvic fluid.  IMPRESSION: 1. There is a tiny fluid-filled sac within the endometrial cavity. A tiny yolk sac is demonstrated. No embryo is demonstrated. There is no subchorionic hemorrhage. 2. The maternal uterus and adnexal structures otherwise are normal in appearance. 3. Serial follow-up beta HCG determinations and follow-up ultrasound examinations are recommended.   Electronically Signed   By: David  [place]   On: 07/05/2013 19:43    MAU Course  Procedures None  MDM Quant hCG and [place] today 1925 - Patient in [place]. Care turned over to [facility], CNM  Freddi Starr, PA-C  07/05/2013, 6:58 PM   Assessment and Plan   1. Pregnancy related pelvic pain in first trimester, antepartum    First trimester preacutions reviewed [facility] as soon as possible Return to MAU as needed Pregnancy verification letter given   Follow-up Information   Schedule an appointment as soon as possible for a visit with [facility] [place].   Contact information:   [address] [place] [address] [phone]

## 2013-07-06 LAB — URINE CULTURE: Colony Count: 50000

## 2013-07-06 LAB — CERVICOVAGINAL ANCILLARY ONLY
CHLAMYDIA, DNA PROBE: NEGATIVE
NEISSERIA GONORRHEA: NEGATIVE

## 2013-07-06 NOTE — ED Notes (Addendum)
GC/Chlamydia neg., Affirm: all neg., Urine culture pending. [name], [name] 07/06/2013 Urine culture: Multiple bacterial types none predominant. 07/07/2013

## 2013-07-06 NOTE — Progress Notes (Signed)
Quick Note:  Test result was normal. No further action is needed at this time. ______ 

## 2013-08-06 ENCOUNTER — Encounter (HOSPITAL_COMMUNITY): Payer: Self-pay | Admitting: Emergency Medicine

## 2013-08-06 ENCOUNTER — Emergency Department (HOSPITAL_COMMUNITY)
Admission: EM | Admit: 2013-08-06 | Discharge: 2013-08-06 | Disposition: A | Discharge status: Home / Self Care | Payer: Medicaid Other | Source: Home / Self Care | Attending: Family Medicine | Admitting: Family Medicine

## 2013-08-06 DIAGNOSIS — Z331 Pregnant state, incidental: Secondary | ICD-10-CM

## 2013-08-06 DIAGNOSIS — K029 Dental caries, unspecified: Secondary | ICD-10-CM

## 2013-08-06 MED ORDER — HYDROCODONE-ACETAMINOPHEN 5-325 MG PO TABS: 1.0000 | ORAL_TABLET | Freq: Four times a day (QID) | ORAL | Status: DC | PRN

## 2013-08-06 MED ORDER — AMOXICILLIN 500 MG PO CAPS: 500.0000 mg | ORAL_CAPSULE | Freq: Three times a day (TID) | ORAL | Status: DC

## 2013-08-06 NOTE — Discharge Instructions (Signed)
Thank you for coming in today. Take amoxicillin 3 times daily Use Norco for severe pain  Please call Dr. Lawrence Marseillesivils office [phone] or cell [phone] [address], [place]  Cost for tooth removal $200 includes exam, Xray, and extraction and follow up visit.  Bring list of current medications with you.

## 2013-08-06 NOTE — ED Notes (Signed)
Toothache.  Onset of pain 2 days ago

## 2013-08-06 NOTE — ED Provider Notes (Signed)
Melissa Danielllenjane F Ibarra is a 33 y.o. female who presents to Urgent Care today for upper left tooth pain present for 2 days. No injury. No fevers or chills nausea vomiting or diarrhea. Pain is moderate to severe worse with chewing. Patient has tried Tylenol which has not helped. She notes that she is approximately [redacted] weeks pregnant. She has not tried to contact a dentist yet because she does not have dental insurance.   Past Medical History  Diagnosis Date  . Asthma   . Hypertension   . Anxiety   . Pregnancy induced hypertension   . Kidney stones   . Ovarian cyst   . Vaginal Pap smear, abnormal     bx, f/u ok  . Depression     doing good, not on meds now   History  Substance Use Topics  . Smoking status: Former Smoker    Types: Cigarettes  . Smokeless tobacco: Never Used     Comment: quit with positive preg test  . Alcohol Use: No   ROS as above Medications: No current facility-administered medications for this encounter.   Current Outpatient Prescriptions  Medication Sig Dispense Refill  . acetaminophen (TYLENOL) 325 MG tablet Take 650 mg by mouth every 6 (six) hours as needed.      Marland Kitchen. amoxicillin (AMOXIL) 500 MG capsule Take 1 capsule (500 mg total) by mouth 3 (three) times daily.  30 capsule  0  . HYDROcodone-acetaminophen (NORCO/VICODIN) 5-325 MG per tablet Take 1 tablet by mouth every 6 (six) hours as needed.  15 tablet  0    Exam:  BP 125/84  Pulse 80  Temp(Src) 98.4 F (36.9 C) (Oral)  Resp 16  SpO2 100%  LMP 06/06/2013 Gen: Well NAD HEENT: EOMI,  MMM multiple dental caries and broken teeth. Upper left rear most molar tender to touch. Lower left bicuspid tender to touch. No abscess noted. Lungs: Normal work of breathing. CTABL Heart: RRR no MRG Abd: NABS, Soft. NT, ND Exts: Brisk capillary refill, warm and well perfused.   No results found for this or any previous visit (from the past 24 hour(s)). No results found.  Assessment and Plan: 33 y.o. female with dental  pain and infection in the setting of pregnancy. Plan to treat with amoxicillin and Norco. Refer to dentist as soon as possible.  Management of peridental  disease is more beneficial than the risk of extraction in the setting of second trimester pregnancy.  Discussed warning signs or symptoms. Please see discharge instructions. Patient expresses understanding.    Rodolph BongEvan S Lilyian Quayle, MD 08/06/13 [phone]

## 2013-08-20 ENCOUNTER — Encounter (HOSPITAL_COMMUNITY): Payer: Self-pay | Admitting: *Deleted

## 2013-08-20 ENCOUNTER — Inpatient Hospital Stay (HOSPITAL_COMMUNITY): Payer: Medicaid Other

## 2013-08-20 ENCOUNTER — Inpatient Hospital Stay (HOSPITAL_COMMUNITY)
Admission: AD | Admit: 2013-08-20 | Discharge: 2013-08-20 | Disposition: A | Discharge status: Home / Self Care | Payer: Medicaid Other | Source: Ambulatory Visit | Attending: Obstetrics and Gynecology | Admitting: Obstetrics and Gynecology

## 2013-08-20 DIAGNOSIS — F3289 Other specified depressive episodes: Secondary | ICD-10-CM | POA: Insufficient documentation

## 2013-08-20 DIAGNOSIS — O034 Incomplete spontaneous abortion without complication: Secondary | ICD-10-CM | POA: Insufficient documentation

## 2013-08-20 DIAGNOSIS — N200 Calculus of kidney: Secondary | ICD-10-CM | POA: Insufficient documentation

## 2013-08-20 DIAGNOSIS — N83209 Unspecified ovarian cyst, unspecified side: Secondary | ICD-10-CM | POA: Insufficient documentation

## 2013-08-20 DIAGNOSIS — I10 Essential (primary) hypertension: Secondary | ICD-10-CM | POA: Insufficient documentation

## 2013-08-20 DIAGNOSIS — F329 Major depressive disorder, single episode, unspecified: Secondary | ICD-10-CM | POA: Insufficient documentation

## 2013-08-20 DIAGNOSIS — R109 Unspecified abdominal pain: Secondary | ICD-10-CM | POA: Insufficient documentation

## 2013-08-20 LAB — URINALYSIS, ROUTINE W REFLEX MICROSCOPIC
BILIRUBIN URINE: NEGATIVE
GLUCOSE, UA: NEGATIVE mg/dL
Ketones, ur: NEGATIVE mg/dL
Leukocytes, UA: NEGATIVE
Nitrite: NEGATIVE
PROTEIN: NEGATIVE mg/dL
Specific Gravity, Urine: >1.03 — ABNORMAL HIGH (ref 1.005–1.030)
Urobilinogen, UA: 0.2 mg/dL (ref 0.0–1.0)
pH: 6 (ref 5.0–8.0)

## 2013-08-20 LAB — CBC
HCT: 37.4 % (ref 36.0–46.0)
HEMOGLOBIN: 12.9 g/dL (ref 12.0–15.0)
MCH: 31.8 pg (ref 26.0–34.0)
MCHC: 34.5 g/dL (ref 30.0–36.0)
MCV: 92.1 fL (ref 78.0–100.0)
Platelets: 240 10*3/uL (ref 150–400)
RBC: 4.06 MIL/uL (ref 3.87–5.11)
RDW: 12.3 % (ref 11.5–15.5)
WBC: 7.4 10*3/uL (ref 4.0–10.5)

## 2013-08-20 LAB — ABO/RH: ABO/RH(D): O NEG

## 2013-08-20 LAB — URINE MICROSCOPIC-ADD ON

## 2013-08-20 MED ORDER — MISOPROSTOL 200 MCG PO TABS: 800.0000 ug | ORAL_TABLET | Freq: Once | ORAL | Status: DC

## 2013-08-20 MED ORDER — HYDROCODONE-ACETAMINOPHEN 5-325 MG PO TABS: 1.0000 | ORAL_TABLET | ORAL | Status: DC | PRN

## 2013-08-20 MED ORDER — PROMETHAZINE HCL 25 MG PO TABS: 25.0000 mg | ORAL_TABLET | Freq: Four times a day (QID) | ORAL | Status: DC | PRN

## 2013-08-20 MED ORDER — RHO D IMMUNE GLOBULIN 1500 UNIT/2ML IJ SOLN
300.0000 ug | Freq: Once | INTRAMUSCULAR | Status: AC
  Administered 2013-08-20: 300 ug via INTRAMUSCULAR
  Filled 2013-08-20: qty 2

## 2013-08-20 MED ORDER — IBUPROFEN 600 MG PO TABS: 600.0000 mg | ORAL_TABLET | Freq: Four times a day (QID) | ORAL | Status: DC | PRN

## 2013-08-20 NOTE — MAU Note (Signed)
Had heavy bleeding this AM (starting around 0900)  but bleeding is scant now; has had no sex for past month; c/o cramping with bleeding;

## 2013-08-20 NOTE — Discharge Instructions (Signed)
Incomplete Miscarriage °A miscarriage is the sudden loss of an unborn baby (fetus) before the 20th week of pregnancy. In an incomplete miscarriage, parts of the fetus or placenta (afterbirth) remain in the body.  °Having a miscarriage can be an emotional experience. Talk with your health care provider about any questions you may have about miscarrying, the grieving process, and your future pregnancy plans. °CAUSES  °· Problems with the fetal chromosomes that make it impossible for the baby to develop normally. Problems with the baby's genes or chromosomes are most often the result of errors that occur by chance as the embryo divides and grows. The problems are not inherited from the parents. °· Infection of the cervix or uterus. °· Hormone problems. °· Problems with the cervix, such as having an incompetent cervix. This is when the tissue in the cervix is not strong enough to hold the pregnancy. °· Problems with the uterus, such as an abnormally shaped uterus, uterine fibroids, or congenital abnormalities. °· Certain medical conditions. °· Smoking, drinking alcohol, or taking illegal drugs. °· Trauma. °SYMPTOMS  °· Vaginal bleeding or spotting, with or without cramps or pain. °· Pain or cramping in the abdomen or lower back. °· Passing fluid, tissue, or blood clots from the vagina. °DIAGNOSIS  °Your health care provider will perform a physical exam. You may also have an ultrasound to confirm the miscarriage. Blood or urine tests may also be ordered. °TREATMENT  °· Usually, a dilation and curettage (D&C) procedure is performed. During a D&C procedure, the cervix is widened (dilated) and any remaining fetal or placental tissue is gently removed from the uterus. °· Antibiotic medicines are prescribed if there is an infection. Other medicines may be given to reduce the size of the uterus (contract) if there is a lot of bleeding. °· If you have Rh negative blood and your baby was Rh positive, you will need an Rho(D)  immune globulin shot. This shot will protect any future baby from having Rh blood problems in future pregnancies. °· You may be confined to bed rest. This means you should stay in bed and only get up to use the bathroom. °HOME CARE INSTRUCTIONS  °· Rest as directed by your health care provider. °· Restrict activity as directed by your health care provider. You may be allowed to continue light activity if curettage was not done but you require further treatment. °· Keep track of the number of pads you use each day. Keep track of how soaked (saturated) they are. Record this information. °· Do not  use tampons. °· Do not douche or have sexual intercourse until approved by your health care provider. °· Keep all follow-up appointments for re-evaluation and continuing management. °· Only take over-the-counter or prescription medicines for pain, fever, or discomfort as directed by your health care provider. °· Take antibiotic medicine as directed by your health care provider. Make sure you finish it even if you start to feel better. °SEEK IMMEDIATE MEDICAL CARE IF:  °· You experience severe cramps in your stomach, back, or abdomen. °· You have an unexplained temperature (make sure to record these temperatures). °· You pass large clots or tissue (save these for your health care provider to inspect). °· Your bleeding increases. °· You become light-headed, weak, or have fainting episodes. °MAKE SURE YOU:  °· Understand these instructions. °· Will watch your condition. °· Will get help right away if you are not doing well or get worse. °Document Released: 05/06/2005 Document Revised: 02/24/2013 Document Reviewed: 12/03/2012 °  ExitCare® Patient Information ©2014 [employer]. ° °

## 2013-08-20 NOTE — MAU Provider Note (Signed)
History     CSN: [id number]  Arrival date and time: 08/20/13 1433   First Provider Initiated Contact with Patient 08/20/13 1513      Chief Complaint  Patient presents with  . Vaginal Bleeding  . Abdominal Cramping   HPI  [name] is a 33 y.o. female G3P1011 at [redacted]w[redacted]d who presents with an episode of vaginal bleeding at 0930 this morning. The bleeding is described as bright red; she continues to bleed currently. She has left lower abdominal cramping that she rates 7/10. She denies intercourse recently; she has been lifting some minor things in the last few days. She denies any prior episodes of bleeding in this pregnancy. She is scheduled this Wednesday for first prenatal visit with [facility].   OB History   Grav Para Term Preterm Abortions TAB SAB Ect Mult Living   3 1 1  0 1 0 1 0 0 1      Past Medical History  Diagnosis Date  . Asthma   . Hypertension   . Anxiety   . Pregnancy induced hypertension   . Kidney stones   . Ovarian cyst   . Vaginal Pap smear, abnormal     bx, f/u ok  . Depression     doing good, not on meds now    Past Surgical History  Procedure Laterality Date  . Cesarean section    . Dilation and curettage of uterus      Family History  Problem Relation Age of Onset  . Hearing loss Mother   . Diabetes Son   . Cancer Maternal Grandfather     lung  . Liver disease Maternal Grandfather   . Cancer Paternal Grandmother     History  Substance Use Topics  . Smoking status: Current Some Day Smoker    Types: Cigarettes  . Smokeless tobacco: Never Used     Comment: quit with positive preg test  . Alcohol Use: No    Allergies: No Known Allergies  Prescriptions prior to admission  Medication Sig Dispense Refill  . acetaminophen (TYLENOL) 325 MG tablet Take 650 mg by mouth every 6 (six) hours as needed for moderate pain.       Marland Kitchen amoxicillin (AMOXIL) 500 MG capsule Take 500 mg by mouth 3 (three) times daily. Pt started this med  on 08/06/13 to take for 10 days. She still has some left today 08/20/13.      Marland Kitchen HYDROcodone-acetaminophen (NORCO/VICODIN) 5-325 MG per tablet Take 1 tablet by mouth every 6 (six) hours as needed.  15 tablet  0  . penicillin v potassium (VEETID) 500 MG tablet Take 500 mg by mouth 4 (four) times daily. Pt started this med on 08/19/13 to take for 7 days.       Results for orders placed during the hospital encounter of 08/20/13 (from the past 48 hour(s))  URINALYSIS, ROUTINE W REFLEX MICROSCOPIC     Status: Abnormal   Collection Time    08/20/13  2:43 PM      Result Value Ref Range   Color, Urine YELLOW  YELLOW   APPearance HAZY (*) CLEAR   Specific Gravity, Urine >1.030 (*) 1.005 - 1.030   pH 6.0  5.0 - 8.0   Glucose, UA NEGATIVE  NEGATIVE mg/dL   Hgb urine dipstick LARGE (*) NEGATIVE   Bilirubin Urine NEGATIVE  NEGATIVE   Ketones, ur NEGATIVE  NEGATIVE mg/dL   Protein, ur NEGATIVE  NEGATIVE mg/dL   Urobilinogen, UA 0.2  0.0 -  1.0 mg/dL   Nitrite NEGATIVE  NEGATIVE   Leukocytes, UA NEGATIVE  NEGATIVE  URINE MICROSCOPIC-ADD ON     Status: Abnormal   Collection Time    08/20/13  2:43 PM      Result Value Ref Range   Squamous Epithelial / LPF FEW (*) RARE   WBC, UA 0-2  <3 WBC/hpf   RBC / HPF 21-50  <3 RBC/hpf  CBC     Status: None   Collection Time    08/20/13  3:14 PM      Result Value Ref Range   WBC 7.4  4.0 - 10.5 K/uL   RBC 4.06  3.87 - 5.11 MIL/uL   Hemoglobin 12.9  12.0 - 15.0 g/dL   HCT 45.437.4  09.836.0 - 11.946.0 %   MCV 92.1  78.0 - 100.0 fL   MCH 31.8  26.0 - 34.0 pg   MCHC 34.5  30.0 - 36.0 g/dL   RDW 14.712.3  82.911.5 - 56.215.5 %   Platelets 240  150 - 400 K/uL  ABO/RH     Status: None   Collection Time    08/20/13  3:14 PM      Result Value Ref Range   ABO/RH(D) O NEG    RH IG WORKUP (INCLUDES ABO/RH)     Status: None   Collection Time    08/20/13  4:01 PM      Result Value Ref Range   Gestational Age(Wks) 9     ABO/RH(D) O NEG     Antibody Screen NEG     Unit Number  [id number]     Blood Component Type RHIG     Unit division 00     Status of Unit ISSUED     Transfusion Status OK TO TRANSFUSE       [place] Ob Comp Less 14 Wks  08/20/2013   CLINICAL DATA:  Pelvic pain and cramping. Vaginal bleeding. Gestational age by LMP of 12 weeks 2 days.  EXAM: OBSTETRIC <14 WK US AND TRANSVAGINAL OB US  TECHNIQUE: Both transabdominal and transvaginal ultrasound examinations were performed for complete evaluation of the gestation as well as the maternal uterus, adnexal regions, and pelvic cul-de-sac. Transvaginal technique was performed to assess early pregnancy.  COMPARISON:  07/05/2013  FINDINGS: Intrauterine gestational sac: Single sac/ abnormal and irregular in shape.  Yolk sac:  Visualized  Embryo:  Visualized  Cardiac Activity: Absent  CRL:   25  mm   9 w 3 d                  US EDC: 03/22/2014  Maternal uterus/adnexae: Small subchorionic hemorrhage noted. Both ovaries are normal in appearance. No adnexal mass or free fluid identified.  IMPRESSION: Findings meet definitive criteria for failed pregnancy. This follows SRU consensus guidelines: Diagnostic Criteria for Nonviable Pregnancy Early in the First Trimester. Macy Mis Engl J Med [phone];369:1443-51.   Electronically Signed   By: Myles RosenthalJohn  Stahl M.D.   On: 08/20/2013 16:27   [place] Ob Transvaginal  08/20/2013   CLINICAL DATA:  Pelvic pain and cramping. Vaginal bleeding. Gestational age by LMP of 12 weeks 2 days.  EXAM: OBSTETRIC <14 WK US AND TRANSVAGINAL OB US  TECHNIQUE: Both transabdominal and transvaginal ultrasound examinations were performed for complete evaluation of the gestation as well as the maternal uterus, adnexal regions, and pelvic cul-de-sac. Transvaginal technique was performed to assess early pregnancy.  COMPARISON:  07/05/2013  FINDINGS: Intrauterine gestational sac: Single sac/ abnormal and irregular in shape.  Yolk sac:  Visualized  Embryo:  Visualized  Cardiac Activity: Absent  CRL:   25  mm   9 w 3 d                  [place]  EDC: 03/22/2014  Maternal uterus/adnexae: Small subchorionic hemorrhage noted. Both ovaries are normal in appearance. No adnexal mass or free fluid identified.  IMPRESSION: Findings meet definitive criteria for failed pregnancy. This follows SRU consensus guidelines: Diagnostic Criteria for Nonviable Pregnancy Early in the First Trimester. Macy Mis J Med [phone].   Electronically Signed   By: Myles Rosenthal M.D.   On: 08/20/2013 16:27   Review of Systems  Constitutional: Negative for fever and chills.  Gastrointestinal: Positive for abdominal pain (Lower abdominal cramping ). Negative for nausea, vomiting, diarrhea and constipation.  Genitourinary: Negative for dysuria, urgency, frequency and hematuria.       No vaginal discharge. + vaginal bleeding; bright red No dysuria.    Physical Exam   Blood pressure 125/90, pulse 92, temperature 99 F (37.2 C), resp. rate 18, height 5\' 4"  (1.626 m), weight 70.308 kg (155 lb), last menstrual period 06/06/2013.  Physical Exam  Constitutional: She is oriented to person, place, and time. She appears well-developed and well-nourished. No distress.  HENT:  Head: Normocephalic.  Eyes: Pupils are equal, round, and reactive to light.  Neck: Neck supple.  Respiratory: Effort normal.  GI: Soft. She exhibits no distension. There is tenderness (+suprapubic tenderness ). There is no rebound and no guarding.  Genitourinary:  Speculum exam: Vagina - Small amount of bright red blood in canal. no odor Cervix - No contact bleeding, small active bleeding from cervix  Bimanual exam: Cervix closed, no CMT Uterus non tender, enlarged  Adnexa non tender, no masses bilaterally Chaperone present for exam.   Musculoskeletal: Normal range of motion.  Neurological: She is alert and oriented to person, place, and time.  Skin: Skin is warm. She is not diaphoretic.  Psychiatric: Her behavior is normal.    MAU Course  Procedures None  MDM Unable to doppler  heart tones in MAU  ABO CBC [place]  O negative blood type; Rhogam workup initiated.  Discussed options with the patient including expectant management, cytotec, and D/C. Pt and family request cytotec; risks discussed at length. Dr. Ellyn Hack consulted and agreed with plan of care. Pt is to call the office on Monday for follow up. Pt meets criteria for Cytotec administration for fetal demise; hgb stable. Pt given Rhogam by RN prior to discharge home   Assessment and Plan   A:  1. Incomplete miscarriage 9 weeks 3 days   P:  Discharge home in stable condition RX: Cytotec 800 mcg (Instructions given for oral or vaginal administration)         Ibuprofen 600 mg        Vicodin         Phenergan  Bleeding precautions dicussed at length Return to MAU as needed, if symptoms worsen Pt is to call the office on Monday to schedule follow up Support given    Iona Hansen Custer Pimenta, NP  08/20/2013, 6:05 PM

## 2013-08-20 NOTE — MAU Note (Signed)
C/o bleeding and cramping since around 0900 this AM;

## 2013-08-21 LAB — RH IG WORKUP (INCLUDES ABO/RH)
ABO/RH(D): O NEG
ANTIBODY SCREEN: NEGATIVE
Gestational Age(Wks): 9
UNIT DIVISION: 0

## 2013-09-23 ENCOUNTER — Encounter (HOSPITAL_COMMUNITY): Payer: Self-pay

## 2013-10-02 ENCOUNTER — Encounter (HOSPITAL_COMMUNITY): Payer: Self-pay | Admitting: Obstetrics and Gynecology

## 2013-10-02 DIAGNOSIS — O021 Missed abortion: Secondary | ICD-10-CM | POA: Diagnosis present

## 2013-10-02 HISTORY — DX: Missed abortion: O02.1

## 2013-10-02 NOTE — H&P (Signed)
[name] is an 33 y.o. femaleG3P1021 with missed Abortion.  Has tried cytotec to pass POC without success.  D/w pt r/b/a of D&C, pt voices understanding, wishes to proceed.   Pertinent Gynecological History: + Abn pap - +HPV, nl since No STD OB History: G3, P1021 G1 SAB, G2 LTCS 8#6 female, G3 present - missed Ab   Menstrual History:  Patient's last menstrual period was 06/06/2013.    Past Medical History  Diagnosis Date  . Asthma   . Hypertension   . Anxiety   . Pregnancy induced hypertension   . Kidney stones   . Ovarian cyst   . Vaginal Pap smear, abnormal     bx, f/u ok  . Depression     doing good, not on meds now  . Missed abortion 10/02/2013    Past Surgical History  Procedure Laterality Date  . Cesarean section    . Dilation and curettage of uterus      Family History  Problem Relation Age of Onset  . Hearing loss Mother   . Diabetes Son   . Cancer Maternal Grandfather     lung  . Liver disease Maternal Grandfather   . Cancer Paternal Grandmother     Social History:  reports that she has been smoking Cigarettes.  She has been smoking about 0.00 packs per day. She has never used smokeless tobacco. She reports that she does not drink alcohol or use illicit drugs.  Allergies: No Known Allergies  No prescriptions prior to admission  Meds cytotec 800mcg x 2  Review of Systems  Constitutional: Negative.   HENT: Negative.   Eyes: Negative.   Respiratory: Negative.   Cardiovascular: Negative.   Gastrointestinal: Negative.   Genitourinary: Negative.   Musculoskeletal: Negative.   Skin: Negative.   Neurological: Negative.   Psychiatric/Behavioral: Negative.     Last menstrual period 06/06/2013. Physical Exam  Constitutional: She is oriented to person, place, and time. She appears well-developed and well-nourished.  HENT:  Head: Normocephalic and atraumatic.  Cardiovascular: Normal rate and regular rhythm.   Respiratory: Effort normal and  breath sounds normal. No respiratory distress. She has no wheezes.  GI: Soft. Bowel sounds are normal. She exhibits no distension. There is no tenderness.  Musculoskeletal: Normal range of motion.  Neurological: She is alert and oriented to person, place, and time.  Skin: Skin is warm and dry.  Psychiatric: She has a normal mood and affect. Her behavior is normal.   MBT = O neg  Assessment/Plan: [name] with missed Ab, failed cytotec induction, for suction D&C, d/w pt r/b/a wishes to proceed.  prophylaxe with doxycycline 200mg  preop and in PACU.  MBT = O neg  Siria Calandro Bovard-Stuckert 10/02/2013, 8:34 PM

## 2013-10-04 ENCOUNTER — Ambulatory Visit (HOSPITAL_COMMUNITY)
Admission: RE | Admit: 2013-10-04 | Discharge: 2013-10-04 | Disposition: A | Discharge status: Home / Self Care | Payer: Medicaid Other | Source: Ambulatory Visit | Attending: Obstetrics and Gynecology | Admitting: Obstetrics and Gynecology

## 2013-10-04 ENCOUNTER — Encounter (HOSPITAL_COMMUNITY)
Admission: RE | Disposition: A | Discharge status: Home / Self Care | Payer: Self-pay | Source: Ambulatory Visit | Attending: Obstetrics and Gynecology

## 2013-10-04 ENCOUNTER — Ambulatory Visit (HOSPITAL_COMMUNITY): Payer: Medicaid Other | Admitting: Certified Registered"

## 2013-10-04 ENCOUNTER — Encounter (HOSPITAL_COMMUNITY): Payer: Self-pay | Admitting: *Deleted

## 2013-10-04 ENCOUNTER — Other Ambulatory Visit (HOSPITAL_COMMUNITY): Payer: Self-pay | Admitting: Obstetrics and Gynecology

## 2013-10-04 ENCOUNTER — Ambulatory Visit (HOSPITAL_COMMUNITY): Payer: Medicaid Other

## 2013-10-04 ENCOUNTER — Encounter (HOSPITAL_COMMUNITY): Payer: Medicaid Other | Admitting: Certified Registered"

## 2013-10-04 DIAGNOSIS — F17200 Nicotine dependence, unspecified, uncomplicated: Secondary | ICD-10-CM | POA: Insufficient documentation

## 2013-10-04 DIAGNOSIS — O021 Missed abortion: Secondary | ICD-10-CM

## 2013-10-04 HISTORY — PX: DILATION AND EVACUATION: SHX1459

## 2013-10-04 HISTORY — DX: Missed abortion: O02.1

## 2013-10-04 LAB — CBC
HEMATOCRIT: 45.8 % (ref 36.0–46.0)
HEMOGLOBIN: 15.5 g/dL — AB (ref 12.0–15.0)
MCH: 32.1 pg (ref 26.0–34.0)
MCHC: 33.8 g/dL (ref 30.0–36.0)
MCV: 94.8 fL (ref 78.0–100.0)
Platelets: 212 10*3/uL (ref 150–400)
RBC: 4.83 MIL/uL (ref 3.87–5.11)
RDW: 13.6 % (ref 11.5–15.5)
WBC: 8.6 10*3/uL (ref 4.0–10.5)

## 2013-10-04 SURGERY — DILATION AND EVACUATION, UTERUS
Anesthesia: Monitor Anesthesia Care | Site: Uterus

## 2013-10-04 MED ORDER — LIDOCAINE HCL (CARDIAC) 20 MG/ML IV SOLN
INTRAVENOUS | Status: DC | PRN
  Administered 2013-10-04: 80 mg via INTRAVENOUS

## 2013-10-04 MED ORDER — KETOROLAC TROMETHAMINE 30 MG/ML IJ SOLN
INTRAMUSCULAR | Status: AC
  Filled 2013-10-04: qty 2

## 2013-10-04 MED ORDER — LIDOCAINE HCL 2 % IJ SOLN
INTRAMUSCULAR | Status: DC | PRN
  Administered 2013-10-04: 15 mL

## 2013-10-04 MED ORDER — DEXAMETHASONE SODIUM PHOSPHATE 10 MG/ML IJ SOLN
INTRAMUSCULAR | Status: DC | PRN
  Administered 2013-10-04: 10 mg via INTRAVENOUS

## 2013-10-04 MED ORDER — IBUPROFEN 800 MG PO TABS: 800.0000 mg | ORAL_TABLET | Freq: Three times a day (TID) | ORAL | Status: DC | PRN

## 2013-10-04 MED ORDER — ONDANSETRON HCL 4 MG/2ML IJ SOLN
INTRAMUSCULAR | Status: DC | PRN
Start: 2013-10-04 — End: 2013-10-04
  Administered 2013-10-04: 4 mg via INTRAVENOUS

## 2013-10-04 MED ORDER — MIDAZOLAM HCL 2 MG/2ML IJ SOLN: 0.5000 mg | Freq: Once | INTRAMUSCULAR | Status: DC | PRN

## 2013-10-04 MED ORDER — LACTATED RINGERS IV SOLN: INTRAVENOUS | Status: DC

## 2013-10-04 MED ORDER — ONDANSETRON HCL 4 MG/2ML IJ SOLN
INTRAMUSCULAR | Status: AC
  Filled 2013-10-04: qty 2

## 2013-10-04 MED ORDER — PROPOFOL INFUSION 10 MG/ML OPTIME
INTRAVENOUS | Status: DC | PRN
  Administered 2013-10-04: 100 ug/kg/min via INTRAVENOUS

## 2013-10-04 MED ORDER — FENTANYL CITRATE 0.05 MG/ML IJ SOLN
INTRAMUSCULAR | Status: AC
  Filled 2013-10-04: qty 2

## 2013-10-04 MED ORDER — KETOROLAC TROMETHAMINE 60 MG/2ML IM SOLN
INTRAMUSCULAR | Status: DC | PRN
  Administered 2013-10-04: 30 mg via INTRAMUSCULAR

## 2013-10-04 MED ORDER — LIDOCAINE HCL (CARDIAC) 20 MG/ML IV SOLN
INTRAVENOUS | Status: AC
  Filled 2013-10-04: qty 5

## 2013-10-04 MED ORDER — LACTATED RINGERS IV SOLN
INTRAVENOUS | Status: DC
  Administered 2013-10-04 (×2): via INTRAVENOUS

## 2013-10-04 MED ORDER — FENTANYL CITRATE 0.05 MG/ML IJ SOLN
INTRAMUSCULAR | Status: DC | PRN
  Administered 2013-10-04: 100 ug via INTRAVENOUS
  Administered 2013-10-04 (×2): 50 ug via INTRAVENOUS

## 2013-10-04 MED ORDER — MIDAZOLAM HCL 2 MG/2ML IJ SOLN
INTRAMUSCULAR | Status: DC | PRN
Start: 2013-10-04 — End: 2013-10-04
  Administered 2013-10-04: 2 mg via INTRAVENOUS

## 2013-10-04 MED ORDER — DOXYCYCLINE HYCLATE 100 MG PO TABS
ORAL_TABLET | ORAL | Status: AC
  Filled 2013-10-04: qty 2

## 2013-10-04 MED ORDER — LIDOCAINE HCL 2 % IJ SOLN
INTRAMUSCULAR | Status: AC
  Filled 2013-10-04: qty 20

## 2013-10-04 MED ORDER — FENTANYL CITRATE 0.05 MG/ML IJ SOLN
25.0000 ug | INTRAMUSCULAR | Status: DC | PRN
  Administered 2013-10-04: 50 ug via INTRAVENOUS

## 2013-10-04 MED ORDER — RHO D IMMUNE GLOBULIN 1500 UNIT/2ML IJ SOSY: 300.0000 ug | PREFILLED_SYRINGE | Freq: Once | INTRAMUSCULAR | Status: DC

## 2013-10-04 MED ORDER — MEPERIDINE HCL 25 MG/ML IJ SOLN: 6.2500 mg | INTRAMUSCULAR | Status: DC | PRN

## 2013-10-04 MED ORDER — KETOROLAC TROMETHAMINE 30 MG/ML IJ SOLN: 15.0000 mg | Freq: Once | INTRAMUSCULAR | Status: DC | PRN

## 2013-10-04 MED ORDER — MIDAZOLAM HCL 2 MG/2ML IJ SOLN
INTRAMUSCULAR | Status: AC
  Filled 2013-10-04: qty 2

## 2013-10-04 MED ORDER — DEXAMETHASONE SODIUM PHOSPHATE 10 MG/ML IJ SOLN
INTRAMUSCULAR | Status: AC
  Filled 2013-10-04: qty 1

## 2013-10-04 MED ORDER — PROMETHAZINE HCL 25 MG/ML IJ SOLN: 6.2500 mg | INTRAMUSCULAR | Status: DC | PRN

## 2013-10-04 MED ORDER — PROPOFOL 10 MG/ML IV BOLUS
INTRAVENOUS | Status: DC | PRN
  Administered 2013-10-04: 20 mg via INTRAVENOUS

## 2013-10-04 MED ORDER — PROPOFOL 10 MG/ML IV EMUL
INTRAVENOUS | Status: AC
  Filled 2013-10-04: qty 40

## 2013-10-04 MED ORDER — KETOROLAC TROMETHAMINE 30 MG/ML IJ SOLN
INTRAMUSCULAR | Status: DC | PRN
  Administered 2013-10-04: 30 mg via INTRAVENOUS

## 2013-10-04 MED ORDER — OXYCODONE-ACETAMINOPHEN 5-325 MG PO TABS: 1.0000 | ORAL_TABLET | Freq: Four times a day (QID) | ORAL | Status: DC | PRN

## 2013-10-04 MED ORDER — DOXYCYCLINE HYCLATE 100 MG PO TABS
200.0000 mg | ORAL_TABLET | Freq: Two times a day (BID) | ORAL | Status: DC
  Administered 2013-10-04: 200 mg via ORAL

## 2013-10-04 SURGICAL SUPPLY — 18 items
CATH ROBINSON RED A/P 16FR (CATHETERS) ×2 IMPLANT
CLOTH BEACON ORANGE TIMEOUT ST (SAFETY) ×2 IMPLANT
DECANTER SPIKE VIAL GLASS SM (MISCELLANEOUS) ×2 IMPLANT
GLOVE BIO SURGEON STRL SZ 6.5 (GLOVE) ×2 IMPLANT
GLOVE BIO SURGEON STRL SZ7 (GLOVE) ×2 IMPLANT
GOWN STRL REUS W/TWL LRG LVL3 (GOWN DISPOSABLE) ×4 IMPLANT
KIT BERKELEY 1ST TRIMESTER 3/8 (MISCELLANEOUS) ×2 IMPLANT
NDL SPNL 22GX3.5 QUINCKE BK (NEEDLE) ×1 IMPLANT
NEEDLE SPNL 22GX3.5 QUINCKE BK (NEEDLE) ×2 IMPLANT
NS IRRIG 1000ML POUR BTL (IV SOLUTION) ×2 IMPLANT
PACK VAGINAL MINOR WOMEN LF (CUSTOM PROCEDURE TRAY) ×2 IMPLANT
PAD OB MATERNITY 4.3X12.25 (PERSONAL CARE ITEMS) ×2 IMPLANT
PAD PREP 24X48 CUFFED NSTRL (MISCELLANEOUS) ×2 IMPLANT
SET BERKELEY SUCTION TUBING (SUCTIONS) ×2 IMPLANT
SYR CONTROL 10ML LL (SYRINGE) ×2 IMPLANT
TOWEL OR 17X24 6PK STRL BLUE (TOWEL DISPOSABLE) ×4 IMPLANT
VACURETTE 8 RIGID CVD (CANNULA) ×1 IMPLANT
VACURETTE 9 RIGID CVD (CANNULA) ×1 IMPLANT

## 2013-10-04 NOTE — Interval H&P Note (Signed)
History and Physical Interval Note:  10/04/2013 11:23 AM  [name]  has presented today for surgery, with the diagnosis of Missed Abortion  The various methods of treatment have been discussed with the patient and family. After consideration of risks, benefits and other options for treatment, the patient has consented to  Procedure(s) with comments: DILATATION AND EVACUATION (N/A) - US in room  as a surgical intervention .  The patient's history has been reviewed, patient examined, no change in status, stable for surgery.  I have reviewed the patient's chart and labs.  Questions were answered to the patient's satisfaction.     [name]

## 2013-10-04 NOTE — Transfer of Care (Signed)
Immediate Anesthesia Transfer of Care Note  Patient: [name]  Procedure(s) Performed: Procedure(s) with comments: DILATATION AND EVACUATION (N/A) - US in room   Patient Location: PACU  Anesthesia Type:MAC  Level of Consciousness: awake, alert  and oriented  Airway & Oxygen Therapy: Patient Spontanous Breathing and Patient connected to nasal cannula oxygen  Post-op Assessment: Report given to PACU RN and Post -op Vital signs reviewed and stable  Post vital signs: Reviewed and stable  Complications: No apparent anesthesia complications

## 2013-10-04 NOTE — Brief Op Note (Signed)
10/04/2013  12:28 PM  PATIENT:  Zigmund DanielEllenjane F Ang  33 y.o. female  PRE-OPERATIVE DIAGNOSIS:  Missed Abortion  POST-OPERATIVE DIAGNOSIS:  Missed Abortion  PROCEDURE:  Procedure(s) with comments: DILATATION AND EVACUATION (N/A) - US in room   SURGEON:  Surgeon(s) and Role:    * Charmane Protzman Bovard-Stuckert, MD - Primary  ANESTHESIA:   IV sedation MAC  EBL:  Total I/O In: 1000 [I.V.:1000] Out: 20 [Urine:20] EBL 100cc  BLOOD ADMINISTERED:none  DRAINS: none   LOCAL MEDICATIONS USED:  LIDOCAINE 2%  and Amount: 15 ml  SPECIMEN:  Source of Specimen:  D&E  DISPOSITION OF SPECIMEN:  PATHOLOGY  COUNTS:  YES  TOURNIQUET:  * No tourniquets in log *  DICTATION: .Other Dictation: Dictation Number [id number]  PLAN OF CARE: Discharge to home after PACU  PATIENT DISPOSITION:  PACU - hemodynamically stable.   Delay start of Pharmacological VTE agent (>24hrs) due to surgical blood loss or risk of bleeding: no

## 2013-10-04 NOTE — Anesthesia Preprocedure Evaluation (Signed)
Anesthesia Evaluation  Patient identified by MRN, date of birth, ID band Patient awake    Reviewed: Allergy & Precautions, H&P , Patient's Chart, lab work & pertinent test results, reviewed documented beta blocker date and time   History of Anesthesia Complications Negative for: history of anesthetic complications  Airway Mallampati: II TM Distance: >3 FB Neck ROM: full    Dental   Pulmonary asthma , Current Smoker,  breath sounds clear to auscultation        Cardiovascular Exercise Tolerance: Good hypertension, Rhythm:regular Rate:Normal     Neuro/Psych negative psych ROS   GI/Hepatic   Endo/Other    Renal/GU      Musculoskeletal   Abdominal   Peds  Hematology   Anesthesia Other Findings   Reproductive/Obstetrics                           Anesthesia Physical Anesthesia Plan  ASA: II  Anesthesia Plan: MAC   Post-op Pain Management:    Induction:   Airway Management Planned:   Additional Equipment:   Intra-op Plan:   Post-operative Plan:   Informed Consent: I have reviewed the patients History and Physical, chart, labs and discussed the procedure including the risks, benefits and alternatives for the proposed anesthesia with the patient or authorized representative who has indicated his/her understanding and acceptance.   Dental Advisory Given  Plan Discussed with: CRNA, Surgeon and Anesthesiologist  Anesthesia Plan Comments:         Anesthesia Quick Evaluation

## 2013-10-04 NOTE — Anesthesia Postprocedure Evaluation (Signed)
  Anesthesia Post-op Note  Anesthesia Post Note  Patient: [name]  Procedure(s) Performed: Procedure(s) (LRB): DILATATION AND EVACUATION (N/A)  Anesthesia type: MAC  Patient location: PACU  Post pain: Pain level controlled  Post assessment: Post-op Vital signs reviewed  Last Vitals:  Filed Vitals:   10/04/13 1315  BP:   Pulse:   Temp: 37.1 C  Resp:     Post vital signs: Reviewed  Level of consciousness: sedated  Complications: No apparent anesthesia complications

## 2013-10-04 NOTE — Discharge Instructions (Addendum)
DISCHARGE INSTRUCTIONS: D&C / D&E The following instructions have been prepared to help you care for yourself upon your return home.  No Ibuprofen containing products (ie Advil, Aleve, Motrin, etc.) until after 6:15 pm tonight   Personal hygiene:  Use sanitary pads for vaginal drainage, not tampons.  Shower the day after your procedure.  NO tub baths, pools or Jacuzzis for 2-3 weeks.  Wipe front to back after using the bathroom.  Activity and limitations:  Do NOT drive or operate any equipment for 24 hours. The effects of anesthesia are still present and drowsiness may result.  Do NOT rest in bed all day.  Walking is encouraged.  Walk up and down stairs slowly.  You may resume your normal activity in one to two days or as indicated by your physician.  Sexual activity: NO intercourse for at least 2 weeks after the procedure, or as indicated by your physician.  Diet: Eat a light meal as desired this evening. You may resume your usual diet tomorrow.  Return to work: You may resume your work activities in one to two days or as indicated by your doctor.  What to expect after your surgery: Expect to have vaginal bleeding/discharge for 2-3 days and spotting for up to 10 days. It is not unusual to have soreness for up to 1-2 weeks. You may have a slight burning sensation when you urinate for the first day. Mild cramps may continue for a couple of days. You may have a regular period in 2-6 weeks.  Call your doctor for any of the following:  Excessive vaginal bleeding, saturating and changing one pad every hour.  Inability to urinate 6 hours after discharge from hospital.  Pain not relieved by pain medication.  Fever of 100.4 F or greater.  Unusual vaginal discharge or odor.   Call for an appointment:    Patients signature: ______________________  Nurses signature ________________________  Support person's signature_______________________

## 2013-10-05 ENCOUNTER — Encounter (HOSPITAL_COMMUNITY): Payer: Self-pay | Admitting: Obstetrics and Gynecology

## 2013-10-05 NOTE — Op Note (Signed)
NAMMarland Kitchen:  [name], [name]            ACCOUNT NO.:  [id number]  MEDICAL RECORD NO.:  [id number]  LOCATION:  [facility]                          FACILITY:  [facility]  PHYSICIAN:  Sherron MondayJody Bovard, MD        DATE OF BIRTH:  April 04, 1981  DATE OF PROCEDURE:  10/04/2013 DATE OF DISCHARGE:  10/04/2013                              OPERATIVE REPORT   PREOPERATIVE DIAGNOSIS:  Missed abortion status post Cytotec x2.  POSTOPERATIVE DIAGNOSIS:  Missed abortion status post Cytotec x2, completed.  PROCEDURE:  Dilatation and evacuation with ultrasound guidance.  SURGEON:  Sherron MondayJody Bovard, MD  ANESTHESIA:  IV sedation and MAC.  IV FLUIDS:  1000 mL.  URINE OUTPUT:  20 mL.  EBL:  Approximately 100 mL.  PARACERVICAL BLOCK:  15 mL of 2% lidocaine.  Specimen to pathology.  PROCEDURE:  After informed consent was reviewed with the patient including risks, benefits, and alternatives of surgical procedure, she was transferred to the OR, placed on the table in a supine position. After anesthesia had been induced and found to be adequate, she was placed in the [place] stirrups, prepped and draped in the normal sterile fashion.  Foley catheter was used to sterilely drain her bladder.  Her uterus sounded to approximately 9.  Her cervix was dilated to accommodate a 25-French Hegar dilator.  A curette was then used to remove products of conception and there was some retained products, this was removed with a series of evacuation with the suction curette as well as minimal use of a large curette.  The patient tolerated the procedure well.  Sponge, lap, and needle count was correct x2 at the end of the procedure.  There was no complication.  Bleeding was within normal limits.  The tenaculum was removed from the cervix as well as the speculum.  The patient tolerated the procedure well.  Sponge, lap, and needle counts were correct x2.     Sherron MondayJody Bovard, MD     JB/MEDQ  D:  10/04/2013  T:  10/05/2013  Job:  [id number]

## 2013-10-08 ENCOUNTER — Emergency Department (HOSPITAL_COMMUNITY)
Admission: EM | Admit: 2013-10-08 | Discharge: 2013-10-08 | Disposition: A | Discharge status: Home / Self Care | Payer: Medicaid Other | Source: Home / Self Care | Attending: Emergency Medicine | Admitting: Emergency Medicine

## 2013-10-08 ENCOUNTER — Encounter (HOSPITAL_COMMUNITY): Payer: Self-pay | Admitting: Emergency Medicine

## 2013-10-08 DIAGNOSIS — J45901 Unspecified asthma with (acute) exacerbation: Secondary | ICD-10-CM

## 2013-10-08 DIAGNOSIS — N390 Urinary tract infection, site not specified: Secondary | ICD-10-CM

## 2013-10-08 LAB — POCT URINALYSIS DIP (DEVICE)
Bilirubin Urine: NEGATIVE
GLUCOSE, UA: NEGATIVE mg/dL
Ketones, ur: NEGATIVE mg/dL
Leukocytes, UA: NEGATIVE
NITRITE: NEGATIVE
PROTEIN: NEGATIVE mg/dL
SPECIFIC GRAVITY, URINE: 1.02 (ref 1.005–1.030)
Urobilinogen, UA: 0.2 mg/dL (ref 0.0–1.0)
pH: 7 (ref 5.0–8.0)

## 2013-10-08 LAB — POCT PREGNANCY, URINE: Preg Test, Ur: NEGATIVE

## 2013-10-08 MED ORDER — BECLOMETHASONE DIPROPIONATE 80 MCG/ACT IN AERS: 2.0000 | INHALATION_SPRAY | Freq: Two times a day (BID) | RESPIRATORY_TRACT | Status: DC

## 2013-10-08 MED ORDER — PREDNISONE 20 MG PO TABS: ORAL_TABLET | ORAL | Status: DC

## 2013-10-08 MED ORDER — ALBUTEROL SULFATE HFA 108 (90 BASE) MCG/ACT IN AERS: 2.0000 | INHALATION_SPRAY | RESPIRATORY_TRACT | Status: DC | PRN

## 2013-10-08 MED ORDER — PHENAZOPYRIDINE HCL 200 MG PO TABS: 200.0000 mg | ORAL_TABLET | Freq: Three times a day (TID) | ORAL | Status: DC | PRN

## 2013-10-08 MED ORDER — CEPHALEXIN 500 MG PO CAPS: 500.0000 mg | ORAL_CAPSULE | Freq: Three times a day (TID) | ORAL | Status: DC

## 2013-10-08 NOTE — ED Notes (Signed)
Multiple complaints:  Abdominal pain, right .  Reports feeling like she needs to go to bathroom, but only has a few drops of urine.  Patient also  Reports having a miscarriage in march 2015.  Patient had d/c on Monday-5/18.  Reports urinary symptoms have worsened since then and having a catheter.  Urinary symptoms started one month ago.   Patient also concerned about cough for 2 weeks.  Reports something in chest makes her cough, coughing up green phlegm.

## 2013-10-08 NOTE — Discharge Instructions (Signed)
Asthma Attack Prevention Although there is no way to prevent asthma from starting, you can take steps to control the disease and reduce its symptoms. Learn about your asthma and how to control it. Take an active role to control your asthma by working with your health care provider to create and follow an asthma action plan. An asthma action plan guides you in:  Taking your medicines properly.  Avoiding things that set off your asthma or make your asthma worse (asthma triggers).  Tracking your level of asthma control.  Responding to worsening asthma.  Seeking emergency care when needed. To track your asthma, keep records of your symptoms, check your peak flow number using a handheld device that shows how well air moves out of your lungs (peak flow meter), and get regular asthma checkups.  WHAT ARE SOME WAYS TO PREVENT AN ASTHMA ATTACK?  Take medicines as directed by your health care provider.  Keep track of your asthma symptoms and level of control.  With your health care provider, write a detailed plan for taking medicines and managing an asthma attack. Then be sure to follow your action plan. Asthma is an ongoing condition that needs regular monitoring and treatment.  Identify and avoid asthma triggers. Many outdoor allergens and irritants (such as pollen, mold, cold air, and air pollution) can trigger asthma attacks. Find out what your asthma triggers are and take steps to avoid them.  Monitor your breathing. Learn to recognize warning signs of an attack, such as coughing, wheezing, or shortness of breath. Your lung function may decrease before you notice any signs or symptoms, so regularly measure and record your peak airflow with a home peak flow meter.  Identify and treat attacks early. If you act quickly, you are less likely to have a severe attack. You will also need less medicine to control your symptoms. When your peak flow measurements decrease and alert you to an upcoming attack,  take your medicine as instructed and immediately stop any activity that may have triggered the attack. If your symptoms do not improve, get medical help.  Pay attention to increasing quick-relief inhaler use. If you find yourself relying on your quick-relief inhaler, your asthma is not under control. See your health care provider about adjusting your treatment. WHAT CAN MAKE MY SYMPTOMS WORSE? A number of common things can set off or make your asthma symptoms worse and cause temporary increased inflammation of your airways. Keep track of your asthma symptoms for several weeks, detailing all the environmental and emotional factors that are linked with your asthma. When you have an asthma attack, go back to your asthma diary to see which factor, or combination of factors, might have contributed to it. Once you know what these factors are, you can take steps to control many of them. If you have allergies and asthma, it is important to take asthma prevention steps at home. Minimizing contact with the substance to which you are allergic will help prevent an asthma attack. Some triggers and ways to avoid these triggers are: Animal Dander:  Some people are allergic to the flakes of skin or dried saliva from animals with fur or feathers.   There is no such thing as a hypoallergenic dog or cat breed. All dogs or cats can cause allergies, even if they don't shed.  Keep these pets out of your home.  If you are not able to keep a pet outdoors, keep the pet out of your bedroom and other sleeping areas at all  times, and keep the door closed.  Remove carpets and furniture covered with cloth from your home. If that is not possible, keep the pet away from fabric-covered furniture and carpets. Dust Mites: Many people with asthma are allergic to dust mites. Dust mites are tiny bugs that are found in every home in mattresses, pillows, carpets, fabric-covered furniture, bedcovers, clothes, stuffed toys, and other  fabric-covered items.   Cover your mattress in a special dust-proof cover.  Cover your pillow in a special dust-proof cover, or wash the pillow each week in hot water. Water must be hotter than 130 F (54.4 C) to kill dust mites. Cold or warm water used with detergent and bleach can also be effective.  Wash the sheets and blankets on your bed each week in hot water.  Try not to sleep or lie on cloth-covered cushions.  Call ahead when traveling and ask for a smoke-free hotel room. Bring your own bedding and pillows in case the hotel only supplies feather pillows and down comforters, which may contain dust mites and cause asthma symptoms.  Remove carpets from your bedroom and those laid on concrete, if you can.  Keep stuffed toys out of the bed, or wash the toys weekly in hot water or cooler water with detergent and bleach. Cockroaches: Many people with asthma are allergic to the droppings and remains of cockroaches.   Keep food and garbage in closed containers. Never leave food out.  Use poison baits, traps, powders, gels, or paste (for example, boric acid).  If a spray is used to kill cockroaches, stay out of the room until the odor goes away. Indoor Mold:  Fix leaky faucets, pipes, or other sources of water that have mold around them.  Clean floors and moldy surfaces with a fungicide or diluted bleach.  Avoid using humidifiers, vaporizers, or swamp coolers. These can spread molds through the air. Pollen and Outdoor Mold:  When pollen or mold spore counts are high, try to keep your windows closed.  Stay indoors with windows closed from late morning to afternoon. Pollen and some mold spore counts are highest at that time.  Ask your health care provider whether you need to take anti-inflammatory medicine or increase your dose of the medicine before your allergy season starts. Other Irritants to Avoid:  Tobacco smoke is an irritant. If you smoke, ask your health care provider how  you can quit. Ask family members to quit smoking too. Do not allow smoking in your home or car.  If possible, do not use a wood-burning stove, kerosene heater, or fireplace. Minimize exposure to all sources of smoke, including to incense, candles, fires, and fireworks.  Try to stay away from strong odors and sprays, such as perfume, talcum powder, hair spray, and paints.  Decrease humidity in your home and use an indoor air cleaning device. Reduce indoor humidity to below 60%. Dehumidifiers or central air conditioners can do this.  Decrease house dust exposure by changing furnace and air cooler filters frequently.  Try to have someone else vacuum for you once or twice a week. Stay out of rooms while they are being vacuumed and for a short while afterward.  If you vacuum, use a dust mask from a hardware store, a double-layered or microfilter vacuum cleaner bag, or a vacuum cleaner with a HEPA filter.  Sulfites in foods and beverages can be irritants. Do not drink beer or wine or eat dried fruit, processed potatoes, or shrimp if they cause asthma symptoms.  Cold air can trigger an asthma attack. Cover your nose and mouth with a scarf on cold or windy days.  Several health conditions can make asthma more difficult to manage, including a runny nose, sinus infections, reflux disease, psychological stress, and sleep apnea. Work with your health care provider to manage these conditions.  Avoid close contact with people who have a respiratory infection such as a cold or the flu, since your asthma symptoms may get worse if you catch the infection. Wash your hands thoroughly after touching items that may have been handled by people with a respiratory infection.  Get a flu shot every year to protect against the flu virus, which often makes asthma worse for days or weeks. Also get a pneumonia shot if you have not previously had one. Unlike the flu shot, the pneumonia shot does not need to be given  yearly. Medicines:  Talk to your health care provider about whether it is safe for you to take aspirin or non-steroidal anti-inflammatory medicines (NSAIDs). In a small number of people with asthma, aspirin and NSAIDs can cause asthma attacks. These medicines must be avoided by people who have known aspirin-sensitive asthma. It is important that people with aspirin-sensitive asthma read labels of all over-the-counter medicines used to treat pain, colds, coughs, and fever.  Beta blockers and ACE inhibitors are other medicines you should discuss with your health care provider. HOW CAN I FIND OUT WHAT I AM ALLERGIC TO? Ask your asthma health care provider about allergy skin testing or blood testing (the RAST test) to identify the allergens to which you are sensitive. If you are found to have allergies, the most important thing to do is to try to avoid exposure to any allergens that you are sensitive to as much as possible. Other treatments for allergies, such as medicines and allergy shots (immunotherapy) are available.  CAN I EXERCISE? Follow your health care provider's advice regarding asthma treatment before exercising. It is important to maintain a regular exercise program, but vigorous exercise, or exercise in cold, humid, or dry environments can cause asthma attacks, especially for those people who have exercise-induced asthma. Document Released: 04/24/2009 Document Revised: 01/06/2013 Document Reviewed: 11/11/2012 [employer] Patient Information 2014 [place], [place].  Urinary Tract Infection Urinary tract infections (UTIs) can develop anywhere along your urinary tract. Your urinary tract is your body's drainage system for removing wastes and extra water. Your urinary tract includes two kidneys, two ureters, a bladder, and a urethra. Your kidneys are a pair of bean-shaped organs. Each kidney is about the size of your fist. They are located below your ribs, one on each side of your  spine. CAUSES Infections are caused by microbes, which are microscopic organisms, including fungi, viruses, and bacteria. These organisms are so small that they can only be seen through a microscope. Bacteria are the microbes that most commonly cause UTIs. SYMPTOMS  Symptoms of UTIs may vary by age and gender of the patient and by the location of the infection. Symptoms in young women typically include a frequent and intense urge to urinate and a painful, burning feeling in the bladder or urethra during urination. Older women and men are more likely to be tired, shaky, and weak and have muscle aches and abdominal pain. A fever may mean the infection is in your kidneys. Other symptoms of a kidney infection include pain in your back or sides below the ribs, nausea, and vomiting. DIAGNOSIS To diagnose a UTI, your caregiver will ask you about your symptoms.  Your caregiver also will ask to provide a urine sample. The urine sample will be tested for bacteria and white blood cells. White blood cells are made by your body to help fight infection. TREATMENT  Typically, UTIs can be treated with medication. Because most UTIs are caused by a bacterial infection, they usually can be treated with the use of antibiotics. The choice of antibiotic and length of treatment depend on your symptoms and the type of bacteria causing your infection. HOME CARE INSTRUCTIONS  If you were prescribed antibiotics, take them exactly as your caregiver instructs you. Finish the medication even if you feel better after you have only taken some of the medication.  Drink enough water and fluids to keep your urine clear or pale yellow.  Avoid caffeine, tea, and carbonated beverages. They tend to irritate your bladder.  Empty your bladder often. Avoid holding urine for long periods of time.  Empty your bladder before and after sexual intercourse.  After a bowel movement, women should cleanse from front to back. Use each tissue only  once. SEEK MEDICAL CARE IF:   You have back pain.  You develop a fever.  Your symptoms do not begin to resolve within 3 days. SEEK IMMEDIATE MEDICAL CARE IF:   You have severe back pain or lower abdominal pain.  You develop chills.  You have nausea or vomiting.  You have continued burning or discomfort with urination. MAKE SURE YOU:   Understand these instructions.  Will watch your condition.  Will get help right away if you are not doing well or get worse. Document Released: 02/13/2005 Document Revised: 11/05/2011 Document Reviewed: 06/14/2011 [facility] Patient Information 2014 [place].

## 2013-10-08 NOTE — ED Provider Notes (Signed)
Chief Complaint   Chief Complaint  Patient presents with  . Cough  . Abdominal Pain    History of Present Illness   [name] is a 33 year old female who presents today with urinary symptoms and cough.  1. Urinary symptoms: The patient has a one-month history of frequent urination, urgency, small amounts of urine, nocturia x4-5, and dysuria. She denies any hematuria and she's had chills but no fever. No nausea, vomiting, or abdominal pain. She had a miscarriage in March, then underwent a D&C 5 days ago because of retained products of conception. She was catheterized for this. She feels her symptoms are worse since the catheterization. She had a urinary tract infection years ago.  2. Cough: This is been going on for about 2 weeks. His productive green sputum. She's had wheezing and chest tightness as well as chest pain and she has a history of asthma and uses albuterol intermittently and she's also had nasal congestion, rhinorrhea, headache, and sore throat.  Review of Systems     Other than as noted above, the patient denies any of the following symptoms: Systemic:  No fever, chills, fatigue, myalgias, headache, or anorexia. Eye:  No redness, pain or drainage. ENT:  No earache, nasal congestion, rhinorrhea, sinus pressure, or sore throat. Lungs:  No cough, sputum production, wheezing, shortness of breath.  Cardiovascular:  No chest pain, palpitations, or syncope. GI:  No nausea, vomiting, abdominal pain or diarrhea. GU:  No dysuria, frequency, or hematuria. Skin:  No rash or pruritis.   PMFSH     Past medical history, family history, social history, meds, and allergies were reviewed.    Physical Examination    Vital signs:  BP 150/94  Pulse 76  Temp(Src) 98.1 F (36.7 C) (Oral)  Resp 16  SpO2 99%  LMP 08/04/2013  Breastfeeding? Unknown General:  Alert, in no distress. Eye:  PERRL, full EOMs.  Lids and conjunctivas were normal. ENT:  TMs and canals were normal,  without erythema or inflammation.  Nasal mucosa was clear and uncongested, without drainage.  Mucous membranes were moist.  Pharynx was clear, without exudate or drainage.  There were no oral ulcerations or lesions. Neck:  Supple, no adenopathy, tenderness or mass. Thyroid was normal. Lungs:  No respiratory distress.  She has scattered expiratory wheezes without rales or rhonchi and good air movement bilaterally. Heart:  Regular rhythm, without gallops, murmers or rubs. Abdomen:  Soft, flat, and non-tender to palpation.  No hepatosplenomagaly or mass. Skin:  Clear, warm, and dry, without rash or lesions.  Labs    Results for orders placed during the hospital encounter of 10/08/13  POCT URINALYSIS DIP (DEVICE)      Result Value Ref Range   Glucose, UA NEGATIVE  NEGATIVE mg/dL   Bilirubin Urine NEGATIVE  NEGATIVE   Ketones, ur NEGATIVE  NEGATIVE mg/dL   Specific Gravity, Urine 1.020  1.005 - 1.030   Hgb urine dipstick MODERATE (*) NEGATIVE   pH 7.0  5.0 - 8.0   Protein, ur NEGATIVE  NEGATIVE mg/dL   Urobilinogen, UA 0.2  0.0 - 1.0 mg/dL   Nitrite NEGATIVE  NEGATIVE   Leukocytes, UA NEGATIVE  NEGATIVE  POCT PREGNANCY, URINE      Result Value Ref Range   Preg Test, Ur NEGATIVE  NEGATIVE    The urine was cultured.   [facility]   Given Depo-Medrol 80 mg IM.  Assessment   The primary encounter diagnosis was UTI (lower urinary  tract infection). A diagnosis of Asthma attack was also pertinent to this visit.  Plan     1.  Meds:  The following meds were prescribed:   Discharge Medication List as of 10/08/2013  5:50 PM    START taking these medications   Details  !! albuterol (PROVENTIL HFA;VENTOLIN HFA) 108 (90 BASE) MCG/ACT inhaler Inhale 2 puffs into the lungs every 4 (four) hours as needed for wheezing or shortness of breath., Starting 10/08/2013, Until Discontinued, Normal    beclomethasone (QVAR) 80 MCG/ACT inhaler Inhale 2 puffs into the lungs 2 (two) times  daily., Starting 10/08/2013, Until Discontinued, Normal    cephALEXin (KEFLEX) 500 MG capsule Take 1 capsule (500 mg total) by mouth 3 (three) times daily., Starting 10/08/2013, Until Discontinued, Normal    phenazopyridine (PYRIDIUM) 200 MG tablet Take 1 tablet (200 mg total) by mouth 3 (three) times daily as needed for pain., Starting 10/08/2013, Until Discontinued, Normal    predniSONE (DELTASONE) 20 MG tablet Take 3 daily for 5 days, 2 daily for 5 days, 1 daily for 5 days., Normal     !! - Potential duplicate medications found. Please discuss with provider.      2.  Patient Education/Counseling:  The patient was given appropriate handouts, self care instructions, and instructed in symptomatic relief.    3.  Follow up:  The patient was told to follow up here if no better in 3 to 4 days, or sooner if becoming worse in any way, and given some red flag symptoms such as worsening difficulty breathing, fever, back or abdominal pain, or vomiting which would prompt immediate return.  [facility] as soon as possible.        Reuben Likesavid C Cornie Herrington, MD 10/08/13 [phone]

## 2013-10-11 LAB — URINE CULTURE

## 2013-10-11 NOTE — ED Notes (Addendum)
Urine culture: Group B strep (S. Agalactiae).  Pt. treated with Keflex.  Message sent to Dr. Lorenz Coaster. He wrote treatment adequate. [name] [facility] 10/11/2013

## 2013-11-01 ENCOUNTER — Emergency Department (HOSPITAL_COMMUNITY)
Admission: EM | Admit: 2013-11-01 | Discharge: 2013-11-01 | Disposition: A | Discharge status: Home / Self Care | Payer: Medicaid Other | Source: Home / Self Care | Attending: Family Medicine | Admitting: Family Medicine

## 2013-11-01 ENCOUNTER — Encounter (HOSPITAL_COMMUNITY): Payer: Self-pay | Admitting: Emergency Medicine

## 2013-11-01 ENCOUNTER — Other Ambulatory Visit (HOSPITAL_COMMUNITY)
Admission: RE | Admit: 2013-11-01 | Discharge: 2013-11-01 | Disposition: A | Discharge status: Home / Self Care | Payer: Medicaid Other | Source: Ambulatory Visit | Attending: Family Medicine | Admitting: Family Medicine

## 2013-11-01 DIAGNOSIS — Z113 Encounter for screening for infections with a predominantly sexual mode of transmission: Secondary | ICD-10-CM | POA: Insufficient documentation

## 2013-11-01 DIAGNOSIS — N760 Acute vaginitis: Secondary | ICD-10-CM | POA: Insufficient documentation

## 2013-11-01 DIAGNOSIS — R319 Hematuria, unspecified: Secondary | ICD-10-CM

## 2013-11-01 DIAGNOSIS — L247 Irritant contact dermatitis due to plants, except food: Secondary | ICD-10-CM

## 2013-11-01 DIAGNOSIS — L255 Unspecified contact dermatitis due to plants, except food: Secondary | ICD-10-CM

## 2013-11-01 DIAGNOSIS — R300 Dysuria: Secondary | ICD-10-CM

## 2013-11-01 LAB — POCT URINALYSIS DIP (DEVICE)
Bilirubin Urine: NEGATIVE
Glucose, UA: NEGATIVE mg/dL
Ketones, ur: NEGATIVE mg/dL
Leukocytes, UA: NEGATIVE
Nitrite: NEGATIVE
PROTEIN: 30 mg/dL — AB
Specific Gravity, Urine: >=1.03 (ref 1.005–1.030)
UROBILINOGEN UA: 0.2 mg/dL (ref 0.0–1.0)
pH: 6 (ref 5.0–8.0)

## 2013-11-01 LAB — POCT PREGNANCY, URINE: Preg Test, Ur: NEGATIVE

## 2013-11-01 MED ORDER — LIDOCAINE HCL (PF) 1 % IJ SOLN
INTRAMUSCULAR | Status: AC
  Filled 2013-11-01: qty 5

## 2013-11-01 MED ORDER — AMOXICILLIN-POT CLAVULANATE 875-125 MG PO TABS: 1.0000 | ORAL_TABLET | Freq: Two times a day (BID) | ORAL | Status: DC

## 2013-11-01 MED ORDER — DOXYCYCLINE HYCLATE 100 MG PO CAPS: 100.0000 mg | ORAL_CAPSULE | Freq: Two times a day (BID) | ORAL | Status: DC

## 2013-11-01 MED ORDER — METRONIDAZOLE 500 MG PO TABS: 500.0000 mg | ORAL_TABLET | Freq: Two times a day (BID) | ORAL | Status: DC

## 2013-11-01 MED ORDER — METHYLPREDNISOLONE SODIUM SUCC 125 MG IJ SOLR
125.0000 mg | Freq: Once | INTRAMUSCULAR | Status: AC
  Administered 2013-11-01: 125 mg via INTRAMUSCULAR

## 2013-11-01 MED ORDER — CEFTRIAXONE SODIUM 1 G IJ SOLR
INTRAMUSCULAR | Status: AC
  Filled 2013-11-01: qty 10

## 2013-11-01 MED ORDER — HYDROXYZINE HCL 25 MG PO TABS: 25.0000 mg | ORAL_TABLET | ORAL | Status: DC | PRN

## 2013-11-01 MED ORDER — FEXOFENADINE HCL 180 MG PO TABS: 180.0000 mg | ORAL_TABLET | Freq: Every day | ORAL | Status: DC

## 2013-11-01 MED ORDER — PREDNISONE 10 MG PO KIT: PACK | ORAL | Status: DC

## 2013-11-01 MED ORDER — CEFTRIAXONE SODIUM 1 G IJ SOLR
1.0000 g | Freq: Once | INTRAMUSCULAR | Status: AC
  Administered 2013-11-01: 1 g via INTRAMUSCULAR

## 2013-11-01 MED ORDER — RANITIDINE HCL 300 MG PO TABS: 300.0000 mg | ORAL_TABLET | Freq: Every day | ORAL | Status: DC

## 2013-11-01 MED ORDER — METHYLPREDNISOLONE SODIUM SUCC 125 MG IJ SOLR
INTRAMUSCULAR | Status: AC
  Filled 2013-11-01: qty 2

## 2013-11-01 NOTE — Discharge Instructions (Signed)
Dysuria Dysuria is the medical term for pain with urination. There are many causes for dysuria, but urinary tract infection is the most common. If a urinalysis was performed it can show that there is a urinary tract infection. A urine culture confirms that you or your child is sick. You will need to follow up with a healthcare provider because:  If a urine culture was done you will need to know the culture results and treatment recommendations.  If the urine culture was positive, you or your child will need to be put on antibiotics or know if the antibiotics prescribed are the right antibiotics for your urinary tract infection.  If the urine culture is negative (no urinary tract infection), then other causes may need to be explored or antibiotics need to be stopped. Today laboratory work may have been done and there does not seem to be an infection. If cultures were done they will take at least 24 to 48 hours to be completed. Today x-rays may have been taken and they read as normal. No cause can be found for the problems. The x-rays may be re-read by a radiologist and you will be contacted if additional findings are made. You or your child may have been put on medications to help with this problem until you can see your primary caregiver. If the problems get better, see your primary caregiver if the problems return. If you were given antibiotics (medications which kill germs), take all of the mediations as directed for the full course of treatment.  If laboratory work was done, you need to find the results. Leave a telephone number where you can be reached. If this is not possible, make sure you find out how you are to get test results. HOME CARE INSTRUCTIONS   Drink lots of fluids. For adults, drink eight, 8 ounce glasses of clear juice or water a day. For children, replace fluids as suggested by your caregiver.  Empty the bladder often. Avoid holding urine for long periods of time.  After a bowel  movement, women should cleanse front to back, using each tissue only once.  Empty your bladder before and after sexual intercourse.  Take all the medicine given to you until it is gone. You may feel better in a few days, but TAKE ALL MEDICINE.  Avoid caffeine, tea, alcohol and carbonated beverages, because they tend to irritate the bladder.  In men, alcohol may irritate the prostate.  Only take over-the-counter or prescription medicines for pain, discomfort, or fever as directed by your caregiver.  If your caregiver has given you a follow-up appointment, it is very important to keep that appointment. Not keeping the appointment could result in a chronic or permanent injury, pain, and disability. If there is any problem keeping the appointment, you must call back to this facility for assistance. SEEK IMMEDIATE MEDICAL CARE IF:   Back pain develops.  A fever develops.  There is nausea (feeling sick to your stomach) or vomiting (throwing up).  Problems are no better with medications or are getting worse. MAKE SURE YOU:   Understand these instructions.  Will watch your condition.  Will get help right away if you are not doing well or get worse. Document Released: 02/02/2004 Document Revised: 07/29/2011 Document Reviewed: 12/10/2007 [facility] Patient Information 2014 [place], [place].  Abdominal Pain, Adult Many things can cause abdominal pain. Usually, abdominal pain is not caused by a disease and will improve without treatment. It can often be observed and treated at home.  Your health care provider will do a physical exam and possibly order blood tests and X-rays to help determine the seriousness of your pain. However, in many cases, more time must pass before a clear cause of the pain can be found. Before that point, your health care provider may not know if you need more testing or further treatment. HOME CARE INSTRUCTIONS  Monitor your abdominal pain for any changes. The following  actions may help to alleviate any discomfort you are experiencing:  Only take over-the-counter or prescription medicines as directed by your health care provider.  Do not take laxatives unless directed to do so by your health care provider.  Try a clear liquid diet (broth, tea, or water) as directed by your health care provider. Slowly move to a bland diet as tolerated. SEEK MEDICAL CARE IF:  You have unexplained abdominal pain.  You have abdominal pain associated with nausea or diarrhea.  You have pain when you urinate or have a bowel movement.  You experience abdominal pain that wakes you in the night.  You have abdominal pain that is worsened or improved by eating food.  You have abdominal pain that is worsened with eating fatty foods. SEEK IMMEDIATE MEDICAL CARE IF:   Your pain does not go away within 2 hours.  You have a fever.  You keep throwing up (vomiting).  Your pain is felt only in portions of the abdomen, such as the right side or the left lower portion of the abdomen.  You pass bloody or black tarry stools. MAKE SURE YOU:  Understand these instructions.   Will watch your condition.   Will get help right away if you are not doing well or get worse.  Document Released: 02/13/2005 Document Revised: 02/24/2013 Document Reviewed: 01/13/2013 [facility] Patient Information 2014 [place], [place].  Poison [employer] Poison ivy is a inflammation of the skin (contact dermatitis) caused by touching the allergens on the leaves of the ivy plant following previous exposure to the plant. The rash usually appears 48 hours after exposure. The rash is usually bumps (papules) or blisters (vesicles) in a linear pattern. Depending on your own sensitivity, the rash may simply cause redness and itching, or it may also progress to blisters which may break open. These must be well cared for to prevent secondary bacterial (germ) infection, followed by scarring. Keep any open areas dry, clean,  dressed, and covered with an antibacterial ointment if needed. The eyes may also get puffy. The puffiness is worst in the morning and gets better as the day progresses. This dermatitis usually heals without scarring, within 2 to 3 weeks without treatment. HOME CARE INSTRUCTIONS  Thoroughly wash with soap and water as soon as you have been exposed to poison ivy. You have about one half hour to remove the plant resin before it will cause the rash. This washing will destroy the oil or antigen on the skin that is causing, or will cause, the rash. Be sure to wash under your fingernails as any plant resin there will continue to spread the rash. Do not rub skin vigorously when washing affected area. Poison ivy cannot spread if no oil from the plant remains on your body. A rash that has progressed to weeping sores will not spread the rash unless you have not washed thoroughly. It is also important to wash any clothes you have been wearing as these may carry active allergens. The rash will return if you wear the unwashed clothing, even several days later. Avoidance of  the plant in the future is the best measure. Poison ivy plant can be recognized by the number of leaves. Generally, poison ivy has three leaves with flowering branches on a single stem. Diphenhydramine may be purchased over the counter and used as needed for itching. Do not drive with this medication if it makes you drowsy.Ask your caregiver about medication for children. SEEK MEDICAL CARE IF:  Open sores develop.  Redness spreads beyond area of rash.  You notice purulent (pus-like) discharge.  You have increased pain.  Other signs of infection develop (such as fever). Document Released: 05/03/2000 Document Revised: 07/29/2011 Document Reviewed: 03/22/2009 [facility] Patient Information 2014 [place], [place].  Hematuria, Adult Hematuria is blood in your urine. It can be caused by a bladder infection, kidney infection, prostate infection, kidney  stone, or cancer of your urinary tract. Infections can usually be treated with medicine, and a kidney stone usually will pass through your urine. If neither of these is the cause of your hematuria, further workup to find out the reason may be needed. It is very important that you tell your health care provider about any blood you see in your urine, even if the blood stops without treatment or happens without causing pain. Blood in your urine that happens and then stops and then happens again can be a symptom of a very serious condition. Also, pain is not a symptom in the initial stages of many urinary cancers. HOME CARE INSTRUCTIONS   Drink lots of fluid, 3 4 quarts a day. If you have been diagnosed with an infection, cranberry juice is especially recommended, in addition to large amounts of water.  Avoid caffeine, tea, and carbonated beverages, because they tend to irritate the bladder.  Avoid alcohol because it may irritate the prostate.  Only take over-the-counter or prescription medicines for pain, discomfort, or fever as directed by your health care provider.  If you have been diagnosed with a kidney stone, follow your health care provider's instructions regarding straining your urine to catch the stone.  Empty your bladder often. Avoid holding urine for long periods of time.  After a bowel movement, women should cleanse front to back. Use each tissue only once.  Empty your bladder before and after sexual intercourse if you are a female. SEEK MEDICAL CARE IF: You develop back pain, fever, a feeling of sickness in your stomach (nausea), or vomiting or if your symptoms are not better in 3 days. Return sooner if you are getting worse. SEEK IMMEDIATE MEDICAL CARE IF:   You have a persistent fever, with a temperature of 101.28F (38.8C) or greater.  You develop severe vomiting and are unable to keep the medicine down.  You develop severe back or abdominal pain despite taking your  medicines.  You begin passing a large amount of blood or clots in your urine.  You feel extremely weak or faint, or you pass out. MAKE SURE YOU:   Understand these instructions.  Will watch your condition.  Will get help right away if you are not doing well or get worse. Document Released: 05/06/2005 Document Revised: 02/24/2013 Document Reviewed: 01/04/2013 [facility] Patient Information 2014 [place], [place].

## 2013-11-01 NOTE — ED Provider Notes (Signed)
CSN: [id number]     Arrival date & time 11/01/13  1648 History   First MD Initiated Contact with Patient 11/01/13 1715     Chief Complaint  Patient presents with  . Poison Ivy  . Urinary Tract Infection   (Consider location/radiation/quality/duration/timing/severity/associated sxs/prior Treatment) HPI Comments: Patient presents complaining of poison ivy rash, also of dysuria and lower abdominal pain. She got into some poison ivy while working in the yard yesterday and the rash has gotten a lot worse today. She has had dysuria now for about 3 weeks. She was seen here on May 22, about 3 weeks ago was treated with Keflex. Her urine culture revealed group B strep. She denies fever, chills, NVD, although she does admit to some flank pain and some lower abdominal pain. No vaginal discharge, no risk for STDs.   Past Medical History  Diagnosis Date  . Asthma   . Hypertension   . Anxiety   . Pregnancy induced hypertension   . Kidney stones   . Ovarian cyst   . Vaginal Pap smear, abnormal     bx, f/u ok  . Depression     doing good, not on meds now  . Missed abortion 10/02/2013   Past Surgical History  Procedure Laterality Date  . Cesarean section    . Dilation and curettage of uterus    . Dilation and evacuation N/A 10/04/2013    Procedure: DILATATION AND EVACUATION;  Surgeon: Janyth Contes, MD;  Location: [facility];  Service: Gynecology;  Laterality: N/A;  [place] in room    Family History  Problem Relation Age of Onset  . Hearing loss Mother   . Diabetes Son   . Cancer Maternal Grandfather     lung  . Liver disease Maternal Grandfather   . Cancer Paternal Grandmother    History  Substance Use Topics  . Smoking status: Current Some Day Smoker    Types: Cigarettes  . Smokeless tobacco: Never Used     Comment: quit with positive preg test  . Alcohol Use: No   OB History   Grav Para Term Preterm Abortions TAB SAB Ect Mult Living   3 1 1  0 1 0 1 0 0 1     Review of Systems   Constitutional: Negative for fever and chills.  Gastrointestinal: Positive for abdominal pain.  Genitourinary: Positive for dysuria, urgency and frequency. Negative for hematuria, vaginal bleeding and vaginal discharge.  Skin: Positive for rash.  All other systems reviewed and are negative.   Allergies  Review of patient's allergies indicates no known allergies.  Home Medications   Prior to Admission medications   Medication Sig Start Date End Date Taking? Authorizing Provider  albuterol (PROVENTIL HFA;VENTOLIN HFA) 108 (90 BASE) MCG/ACT inhaler Inhale 2 puffs into the lungs as needed for wheezing or shortness of breath.    Historical Provider, MD  albuterol (PROVENTIL HFA;VENTOLIN HFA) 108 (90 BASE) MCG/ACT inhaler Inhale 2 puffs into the lungs every 4 (four) hours as needed for wheezing or shortness of breath. 10/08/13   Harden Mo, MD  amoxicillin-clavulanate (AUGMENTIN) 875-125 MG per tablet Take 1 tablet by mouth every 12 (twelve) hours. 11/01/13   Liam Graham, PA-C  beclomethasone (QVAR) 80 MCG/ACT inhaler Inhale 2 puffs into the lungs 2 (two) times daily. 10/08/13   Harden Mo, MD  cephALEXin (KEFLEX) 500 MG capsule Take 1 capsule (500 mg total) by mouth 3 (three) times daily. 10/08/13   Harden Mo, MD  fexofenadine (  ALLEGRA) 180 MG tablet Take 1 tablet (180 mg total) by mouth daily. 11/01/13   Liam Graham, PA-C  hydrOXYzine (ATARAX/VISTARIL) 25 MG tablet Take 1 tablet (25 mg total) by mouth every 4 (four) hours as needed for itching. 11/01/13   Liam Graham, PA-C  ibuprofen (MOTRIN IB) 800 MG tablet Take 1 tablet (800 mg total) by mouth every 8 (eight) hours as needed for moderate pain. 10/04/13   Janyth Contes, MD  oxyCODONE-acetaminophen (ROXICET) 5-325 MG per tablet Take 1-2 tablets by mouth every 6 (six) hours as needed for severe pain. 10/04/13   Janyth Contes, MD  phenazopyridine (PYRIDIUM) 200 MG tablet Take 1 tablet (200 mg total) by mouth 3  (three) times daily as needed for pain. 10/08/13   Harden Mo, MD  predniSONE (DELTASONE) 20 MG tablet Take 3 daily for 5 days, 2 daily for 5 days, 1 daily for 5 days. 10/08/13   Harden Mo, MD  PredniSONE 10 MG KIT 12 day taper dose pack. Use as directed 11/01/13   Liam Graham, PA-C  ranitidine (ZANTAC) 300 MG tablet Take 1 tablet (300 mg total) by mouth at bedtime. 11/01/13   Freeman Caldron Shelsey Rieth, PA-C   BP 145/95  Pulse 91  Temp(Src) 98.9 F (37.2 C) (Oral)  Resp 16  SpO2 100%  LMP 08/04/2013 Physical Exam  Nursing note and vitals reviewed. Constitutional: She is oriented to person, place, and time. Vital signs are normal. She appears well-developed and well-nourished. No distress.  HENT:  Head: Normocephalic and atraumatic.  Cardiovascular: Normal rate, regular rhythm and normal heart sounds.   Pulmonary/Chest: Effort normal and breath sounds normal. No respiratory distress.  Abdominal: Soft. She exhibits no mass. There is tenderness (minimal suprapubic tenderness). There is no rebound and no guarding.  Neurological: She is alert and oriented to person, place, and time. She has normal strength. Coordination normal.  Skin: Skin is warm and dry. Rash noted. Rash is vesicular (Erythematous vesicular rash in a linear distribution on the bilateral lower extremities). She is not diaphoretic.  Psychiatric: She has a normal mood and affect. Judgment normal.    ED Course  Procedures (including critical care time) Labs Review Labs Reviewed  POCT URINALYSIS DIP (DEVICE) - Abnormal; Notable for the following:    Hgb urine dipstick LARGE (*)    Protein, ur 30 (*)    All other components within normal limits  URINE CULTURE  POCT PREGNANCY, URINE  URINE CYTOLOGY ANCILLARY ONLY    Imaging Review No results found.   MDM   1. Irritant contact dermatitis due to plant   2. Dysuria   3. Hematuria     Susceptibilities were not done on the group B strep due to universal  susceptibility to penicillin, however there is a possibility of resistance to Keflex. I will prescribe her a course of Augmentin and send a culture again. Also sending cytology to check for any other infection or STDs. Followup with primary care in a few days for recheck  She also admits she hasn't not stopped bleeding since having a D&C for a missed abortion over a month ago. Given this new information, she may be suffering retained products of conception. Switch Augmentin to doxycycline and metronidazole, and will give an injection of ceftriaxone here. She will call her GYN in the morning to schedule an appointment   Meds ordered this encounter  Medications  . methylPREDNISolone sodium succinate (SOLU-MEDROL) 125 mg/2 mL injection 125 mg  Sig:   . amoxicillin-clavulanate (AUGMENTIN) 875-125 MG per tablet    Sig: Take 1 tablet by mouth every 12 (twelve) hours.    Dispense:  14 tablet    Refill:  0    Order Specific Question:  Supervising Provider    Answer:  Billy Fischer [phone]  . PredniSONE 10 MG KIT    Sig: 12 day taper dose pack. Use as directed    Dispense:  48 each    Refill:  0    Order Specific Question:  Supervising Provider    Answer:  Billy Fischer [phone]  . ranitidine (ZANTAC) 300 MG tablet    Sig: Take 1 tablet (300 mg total) by mouth at bedtime.    Dispense:  14 tablet    Refill:  0    Order Specific Question:  Supervising Provider    Answer:  Billy Fischer [phone]  . fexofenadine (ALLEGRA) 180 MG tablet    Sig: Take 1 tablet (180 mg total) by mouth daily.    Dispense:  14 tablet    Refill:  0    Order Specific Question:  Supervising Provider    Answer:  Billy Fischer [phone]  . hydrOXYzine (ATARAX/VISTARIL) 25 MG tablet    Sig: Take 1 tablet (25 mg total) by mouth every 4 (four) hours as needed for itching.    Dispense:  20 tablet    Refill:  0    Order Specific Question:  Supervising Provider    Answer:  Ihor Gully D [place],  PA-C 11/01/13 [place] Kyandra Mcclaine, PA-C 11/01/13 1820

## 2013-11-01 NOTE — ED Notes (Signed)
Patient c/o posion ivy onset yesterday. Rash is all over her body and very itchy. Has used Calamine lotion with no relief. Patient also reports she was recently treated for UTI but sx persist and pain in sides is worse. Patient reports she finished antibiotics. Patient is alert and oriented and in no acute distress.

## 2013-11-02 NOTE — ED Provider Notes (Signed)
Medical screening examination/treatment/procedure(s) were performed by resident physician or non-physician practitioner and as supervising physician I was immediately available for consultation/collaboration.   Barkley BrunsKINDL,Gevorg Brum DOUGLAS MD.   Linna HoffJames D Jaydn Moscato, MD 11/02/13 [phone]

## 2013-11-03 LAB — URINE CULTURE
COLONY COUNT: NO GROWTH
CULTURE: NO GROWTH

## 2014-02-07 ENCOUNTER — Emergency Department (HOSPITAL_COMMUNITY): Payer: Medicaid Other

## 2014-02-07 ENCOUNTER — Encounter (HOSPITAL_COMMUNITY): Payer: Self-pay | Admitting: Emergency Medicine

## 2014-02-07 ENCOUNTER — Emergency Department (HOSPITAL_COMMUNITY)
Admission: EM | Admit: 2014-02-07 | Discharge: 2014-02-07 | Disposition: A | Discharge status: Home / Self Care | Payer: Medicaid Other | Attending: Emergency Medicine | Admitting: Emergency Medicine

## 2014-02-07 DIAGNOSIS — Z87442 Personal history of urinary calculi: Secondary | ICD-10-CM | POA: Insufficient documentation

## 2014-02-07 DIAGNOSIS — R110 Nausea: Secondary | ICD-10-CM | POA: Diagnosis not present

## 2014-02-07 DIAGNOSIS — F411 Generalized anxiety disorder: Secondary | ICD-10-CM | POA: Insufficient documentation

## 2014-02-07 DIAGNOSIS — K5900 Constipation, unspecified: Secondary | ICD-10-CM | POA: Insufficient documentation

## 2014-02-07 DIAGNOSIS — Z3202 Encounter for pregnancy test, result negative: Secondary | ICD-10-CM | POA: Diagnosis not present

## 2014-02-07 DIAGNOSIS — F3289 Other specified depressive episodes: Secondary | ICD-10-CM | POA: Insufficient documentation

## 2014-02-07 DIAGNOSIS — J45909 Unspecified asthma, uncomplicated: Secondary | ICD-10-CM | POA: Diagnosis not present

## 2014-02-07 DIAGNOSIS — F17200 Nicotine dependence, unspecified, uncomplicated: Secondary | ICD-10-CM | POA: Diagnosis not present

## 2014-02-07 DIAGNOSIS — A499 Bacterial infection, unspecified: Secondary | ICD-10-CM | POA: Insufficient documentation

## 2014-02-07 DIAGNOSIS — R109 Unspecified abdominal pain: Secondary | ICD-10-CM | POA: Diagnosis present

## 2014-02-07 DIAGNOSIS — Z79899 Other long term (current) drug therapy: Secondary | ICD-10-CM | POA: Diagnosis not present

## 2014-02-07 DIAGNOSIS — N760 Acute vaginitis: Secondary | ICD-10-CM | POA: Diagnosis not present

## 2014-02-07 DIAGNOSIS — Z9889 Other specified postprocedural states: Secondary | ICD-10-CM | POA: Insufficient documentation

## 2014-02-07 DIAGNOSIS — B9689 Other specified bacterial agents as the cause of diseases classified elsewhere: Secondary | ICD-10-CM | POA: Insufficient documentation

## 2014-02-07 DIAGNOSIS — F329 Major depressive disorder, single episode, unspecified: Secondary | ICD-10-CM | POA: Insufficient documentation

## 2014-02-07 DIAGNOSIS — I10 Essential (primary) hypertension: Secondary | ICD-10-CM | POA: Diagnosis not present

## 2014-02-07 LAB — CBC WITH DIFFERENTIAL/PLATELET
BASOS ABS: 0.1 10*3/uL (ref 0.0–0.1)
Basophils Relative: 2 % — ABNORMAL HIGH (ref 0–1)
EOS ABS: 0.1 10*3/uL (ref 0.0–0.7)
Eosinophils Relative: 2 % (ref 0–5)
HCT: 42.7 % (ref 36.0–46.0)
Hemoglobin: 14.2 g/dL (ref 12.0–15.0)
LYMPHS ABS: 1.9 10*3/uL (ref 0.7–4.0)
Lymphocytes Relative: 29 % (ref 12–46)
MCH: 31.3 pg (ref 26.0–34.0)
MCHC: 33.3 g/dL (ref 30.0–36.0)
MCV: 94.3 fL (ref 78.0–100.0)
Monocytes Absolute: 0.7 10*3/uL (ref 0.1–1.0)
Monocytes Relative: 10 % (ref 3–12)
NEUTROS PCT: 57 % (ref 43–77)
Neutro Abs: 3.7 10*3/uL (ref 1.7–7.7)
PLATELETS: 307 10*3/uL (ref 150–400)
RBC: 4.53 MIL/uL (ref 3.87–5.11)
RDW: 12.7 % (ref 11.5–15.5)
WBC: 6.4 10*3/uL (ref 4.0–10.5)

## 2014-02-07 LAB — COMPREHENSIVE METABOLIC PANEL
ALT: 16 U/L (ref 0–35)
AST: 24 U/L (ref 0–37)
Albumin: 3.9 g/dL (ref 3.5–5.2)
Alkaline Phosphatase: 72 U/L (ref 39–117)
Anion gap: 11 (ref 5–15)
BUN: 9 mg/dL (ref 6–23)
CO2: 26 meq/L (ref 19–32)
CREATININE: 1.11 mg/dL — AB (ref 0.50–1.10)
Calcium: 9.6 mg/dL (ref 8.4–10.5)
Chloride: 102 mEq/L (ref 96–112)
GFR, EST AFRICAN AMERICAN: 76 mL/min — AB (ref >90)
GFR, EST NON AFRICAN AMERICAN: 65 mL/min — AB (ref >90)
GLUCOSE: 91 mg/dL (ref 70–99)
Potassium: 4.2 mEq/L (ref 3.7–5.3)
SODIUM: 139 meq/L (ref 137–147)
Total Bilirubin: 0.6 mg/dL (ref 0.3–1.2)
Total Protein: 7.1 g/dL (ref 6.0–8.3)

## 2014-02-07 LAB — WET PREP, GENITAL
Trich, Wet Prep: NONE SEEN
WBC, Wet Prep HPF POC: NONE SEEN
Yeast Wet Prep HPF POC: NONE SEEN

## 2014-02-07 LAB — URINALYSIS, ROUTINE W REFLEX MICROSCOPIC
Bilirubin Urine: NEGATIVE
Glucose, UA: NEGATIVE mg/dL
Hgb urine dipstick: NEGATIVE
KETONES UR: NEGATIVE mg/dL
Leukocytes, UA: NEGATIVE
NITRITE: NEGATIVE
PROTEIN: NEGATIVE mg/dL
Specific Gravity, Urine: 1.018 (ref 1.005–1.030)
Urobilinogen, UA: 1 mg/dL (ref 0.0–1.0)
pH: 7.5 (ref 5.0–8.0)

## 2014-02-07 LAB — LIPASE, BLOOD: LIPASE: 42 U/L (ref 11–59)

## 2014-02-07 LAB — PREGNANCY, URINE: PREG TEST UR: NEGATIVE

## 2014-02-07 MED ORDER — METRONIDAZOLE 500 MG PO TABS: 500.0000 mg | ORAL_TABLET | Freq: Two times a day (BID) | ORAL | Status: DC

## 2014-02-07 NOTE — ED Notes (Signed)
Pt c/o generalized abd pain, lower back an d nausea x couple weeks.

## 2014-02-07 NOTE — Discharge Instructions (Signed)
°Emergency Department Resource Guide °1) Find a Doctor and Pay Out of Pocket °Although you won't have to find out who is covered by your insurance plan, it is a good idea to ask around and get recommendations. You will then need to call the office and see if the doctor you have chosen will accept you as a new patient and what types of options they offer for patients who are self-pay. Some doctors offer discounts or will set up payment plans for their patients who do not have insurance, but you will need to ask so you aren't surprised when you get to your appointment. ° °2) Contact Your Local Health Department °Not all health departments have doctors that can see patients for sick visits, but many do, so it is worth a call to see if yours does. If you don't know where your local health department is, you can check in your phone book. The CDC also has a tool to help you locate your state's health department, and many state websites also have listings of all of their local health departments. ° °3) Find a Walk-in Clinic °If your illness is not likely to be very severe or complicated, you may want to try a walk in clinic. These are popping up all over the country in pharmacies, drugstores, and shopping centers. They're usually staffed by nurse practitioners or physician assistants that have been trained to treat common illnesses and complaints. They're usually fairly quick and inexpensive. However, if you have serious medical issues or chronic medical problems, these are probably not your best option. ° °No Primary Care Doctor: °- Call Health Connect at  832-8000 - they can help you locate a primary care doctor that  accepts your insurance, provides certain services, etc. °- Physician Referral Service- 1-800-533-3463 ° °Chronic Pain Problems: °Organization         Address  Phone   Notes  °[facility]  (336) 297-2271 Patients need to be referred by their primary care doctor.  ° °Medication  Assistance: °Organization         Address  Phone   Notes  °[place] [facility] [address] °[place], [place] [address] (336) 641-8030 --Must be a resident of [place] °-- Must have NO insurance coverage whatsoever (no Medicaid/ Medicare, etc.) °-- The pt. MUST have a primary care doctor that directs their care regularly and follows them in the community °  °[facility]  (866) 331-1348   °[employer]  (888) 892-1162   ° °Agencies that provide inexpensive medical care: °Organization         Address  Phone   Notes  °[facility]  (336) 832-8035   °[facility]    (336) 832-7272   °[facility] [address] °[place], [place] [address] (336) 832-4777   °[facility] [address], °[place] (336) 271-4999   °[facility]    (336) 373-0678   °[facility]    (336) 272-1050   °[facility] ° [address], [place] Phone:  (336) 832-4444, Fax:  (336) 832-4440 Hours of Operation:  9 am - 6 pm, M-F.  Also accepts Medicaid/Medicare and self-pay.  °[facility] ° [address], [place] Phone: (336) [phone], Fax: (336) [phone]. Hours of Operation:  8:30 am - 5:30 pm, M-F.  Also accepts Medicaid and self-pay.  °[facility] [place] [address], [place] Phone: (336) 878-6027   °[facility] [address], [place], [place] (336)723-1848, Ext. 123 Mondays & Thursdays: 7-9 AM.  First 15 patients are seen on a first come, first serve basis. °  ° °Medicaid-accepting [place] Providers: ° °Organization         Address  Phone   Notes  °[facility] [address], [place] (336) 641-2100 Also accepts self-pay patients.  °[facility] [address], [place] ° (336) 856-9996   °[facility] [address], [place]  (336) 288-8857   °[facility] [address] [place] [address], [place] (336) 299-7000   °Veita Bland [address], [place]  ° (336) 373-1557 Only accepts [place] Access Medicaid patients after they have their name applied to their card.  ° °Self-Pay (no insurance) in [place]: ° °Organization         Address  Phone   Notes  °Sickle Cell Patients, [facility] [address], [place] (336) 832-1970   °[facility] [address], [place] (336) 832-4400   °[facility] ° [address], [place] (336) 992-4800   °[facility]/Dr. Osei-Bonsu ° [address] [place] [address], [place] or [address], [place] (336) [phone] Phone number for both [place] and [place] locations is the same.  °[facility] [address], [place] (336) 299-0000   °[facility] [address] [place] [address], [place] or [address] (336) 852-7530 °(336) 878-2260   °[facility] [address], [place] (336) 350-1642, phone; (336) 294-5005, fax Sees patients 1st and 3rd Saturday of every month.  Must not qualify for public or private insurance (i.e. Medicaid, Medicare, [place] Health Choice, Veterans' Benefits) • Household income should be no more than 200% of the poverty level •The clinic cannot treat you if you are pregnant or think you are pregnant • Sexually transmitted diseases are not treated at the clinic.  ° ° °Dental Care: °Organization         Address  Phone  Notes  °[place] [facility] [address], [place] (336) 641-6152 Accepts children up to age 21 who are enrolled in Medicaid or [place] Health Choice; pregnant women with a Medicaid card; and children who have applied for Medicaid or [place] Health Choice, but were declined, whose parents can pay a reduced fee at time of service.  °[place]  [facility] [place]  [address], [place] (336) 641-7733 Accepts children up to age 21 who are enrolled in Medicaid or [place] Health Choice; pregnant women with a Medicaid card; and children who have applied for Medicaid or [place] Health Choice, but were declined, whose parents can pay a reduced fee at time of service.  °[facility] ° [address], [place] (336) 641-4533 Patients are seen by appointment only. Walk-ins are not accepted. [facility] will see patients 18 years of age and older. °Monday - Tuesday (8am-5pm) °Most Wednesdays (8:30-5pm) °$30 per visit, cash only  °[facility] ° [address], [place] (336) 641-4533 Patients are seen by appointment only. Walk-ins are not accepted. [facility] will see patients 18 years of age and older. °One   Wednesday Evening (Monthly: Volunteer Based).  $30 per visit, cash only  °[facility]  (919) 537-3737 for adults; Children under age 4, call Graduate Pediatric Dentistry at (919) [phone]. Children aged 4-14, please call (919) [phone] to request a pediatric application. ° Dental services are provided in all areas of dental care including fillings, crowns and bridges, complete and partial dentures, implants, gum treatment, root canals, and extractions. Preventive care is also provided. Treatment is provided to both adults and children. °Patients are selected via a lottery and there is often a waiting list. °  °[facility] [address], °[place] ° (336) 763-8833 [email or web address].[email or web address] °  °[facility] [address], [place], [place] (336)723-1848, Ext. 123 Second and Fourth Thursday of each month, opens at 6:30 AM; Clinic ends at 9 AM.  Patients are seen on a first-come first-served basis, and a limited number are seen during each clinic.  ° °[facility] ° [address], [place], [place] (336) [phone]    Eligibility Requirements °You must have lived in [place], or [place] counties for at least the last three months. °  You cannot be eligible for state or federal sponsored healthcare insurance, including Veterans Administration, Medicaid, or Medicare. °  You generally cannot be eligible for healthcare insurance through your employer.  °  How to apply: °Eligibility screenings are held every Tuesday and Wednesday afternoon from 1:00 pm until 4:00 pm. You do not need an appointment for the interview!  °[facility] [address], [place], [place] 336-631-2330   °[facility]  336-342-8273   °[facility]  336-703-3100   °[facility]  336-570-6415   ° °Behavioral Health Resources in the Community: °Intensive Outpatient Programs °Organization         Address  Phone  Notes  °[facility] [address], [place], [place] 336-878-6098   °[facility] [address], [place], [place] 336-832-9800   °[facility] [address], [place], [place] ° 336-882-2125   °[facility] [address],  °[place], [place] 1-800-853-5163 or 336-641-4981   °Substance Abuse Resources °Organization         Address  Phone  Notes  °[facility]  336-882-2125   °[facility]  336-784-9470   °[facility]  336-285-9073   °[facility]  336-845-3988   °Residential & Outpatient Substance Abuse Program  1-800-659-3381   °Psychological Services °Organization         Address  Phone  Notes  °[facility]  336- 832-9600   °[facility]  336- 378-7881   °[facility] [address], [place] 1-800-853-5163 or 336-641-4981   ° °Mobile Crisis Teams °Organization         Address  Phone  Notes  °[facility], Mobile Crisis Care Unit  1-877-626-1772   °Assertive °[facility] ° [address]  [place], [place] 336-834-9664   °Sharon DeEsch [address] °[place] 336-554-5454   ° °Self-Help/Support Groups °Organization         Address  Phone             Notes  °[facility]  - variety of support groups  336- 373-1402 Call for more information  °Narcotics Anonymous (NA), [facility] [address], °[place]  2 meetings at this location  ° °  Residential Treatment Programs Organization         Address  Phone  Notes  [facility] [address] [place] [address]    [place]  1-610-960-4540   [facility]  [address], [place] [address], [place], [place] [phone]   [facility] [address] [place], [place] 086-578-4696 Admissions: 8am-3pm M-F  [facility] [address],    [place], [place] 295-284-1324   [facility] [address] [place], [place], [place] 401-027-2536   [facility] [address],  [place], [place] [phone]   [facility] Intensive Outpatient [address], [place], [place] [phone]   [facility] [address] [place]  [place], [place] 3-295-188-4166 or [phone]   [facility] ([facility] [address], [place], [place] 323-557-3220 Accepts Medicaid  [facility] [employer] [address],  [place] 2-542-706-2376 Substance Abuse/Addiction Treatment   [facility]         Address  Phone  Notes  [facility]  [phone]   Angie Fava, PhD [address]vin Knack [place], [place]   (559)360-6993 or 410-610-4631   [facility]   [address] [place], [place] (430)866-1637   [facility] [address], [place], [place] (856) [phone] Insurance/Medicaid/sponsorship through [facility] [address]                                    [place], [place] (646)[phone] Therapy/tele-psych/case    [facility] [address], [place] (640)[phone]    Dr. Lolly Mustache  [phone]   [facility]  [employer] [facility] Dept. 1) [address], [place] 2) [address], [place] 3)  [address], [place] ([phone]  306-338-4656   [facility] Child Abuse Hotline 5858184238 or (914)124-7695 (After Hours)       Take over the counter laxative (such as miralax, milk of magnesia, senokot) AND a dulcolax suppository today and repeat both tomorrow.  Begin to take over the counter stool softener (colace), as directed on packaging, for the next month.  Take prescription as directed.  Call your regular medical doctor and your OB/GYN doctor tomorrow to schedule a follow up appointment within the next week.  Return to the Emergency Department immediately if worsening.

## 2014-02-07 NOTE — ED Notes (Signed)
Pt did not respond. Called 3 times

## 2014-02-07 NOTE — ED Provider Notes (Signed)
CSN: [id number]     Arrival date & time 02/07/14  1434 History   First MD Initiated Contact with Patient 02/07/14 2024     Chief Complaint  Patient presents with  . Abdominal Pain  . Back Pain  . Nausea      HPI Pt was seen at 2110. Per pt, c/o gradual onset and persistence of constant "dysuria" for the past 4 months. States she has been evaluated multiple [facility] for same at [facility], dx UTI and rx abx "that don't work." Describes the pain as located in her left and right sides, radiating into her lower back. Has been associated with mild nausea. Denies any change in her symptoms over the past 4 months. Denies fevers, no vomiting/diarrhea, no vaginal bleeding/discharge, no rash, no hematuria.    Past Medical History  Diagnosis Date  . Asthma   . Hypertension   . Anxiety   . Pregnancy induced hypertension   . Kidney stones   . Ovarian cyst   . Vaginal Pap smear, abnormal     bx, f/u ok  . Depression     doing good, not on meds now  . Missed abortion 10/02/2013   Past Surgical History  Procedure Laterality Date  . Cesarean section    . Dilation and curettage of uterus    . Dilation and evacuation N/A 10/04/2013    Procedure: DILATATION AND EVACUATION;  Surgeon: Sherian Rein, MD;  Location: [facility];  Service: Gynecology;  Laterality: N/A;  [place] in room    Family History  Problem Relation Age of Onset  . Hearing loss Mother   . Diabetes Son   . Cancer Maternal Grandfather     lung  . Liver disease Maternal Grandfather   . Cancer Paternal Grandmother    History  Substance Use Topics  . Smoking status: Current Some Day Smoker    Types: Cigarettes  . Smokeless tobacco: Never Used     Comment: quit with positive preg test  . Alcohol Use: No   OB History   Grav Para Term Preterm Abortions TAB SAB Ect Mult Living   0 1 0 1 0 0 1     Review of Systems ROS: Statement: All systems negative except as marked or noted in the HPI; Constitutional: Negative for fever  and chills. ; ; Eyes: Negative for eye pain, redness and discharge. ; ; ENMT: Negative for ear pain, hoarseness, nasal congestion, sinus pressure and sore throat. ; ; Cardiovascular: Negative for chest pain, palpitations, diaphoresis, dyspnea and peripheral edema. ; ; Respiratory: Negative for cough, wheezing and stridor. ; ; Gastrointestinal: +nausea. Negative for vomiting, diarrhea, abdominal pain, blood in stool, hematemesis, jaundice and rectal bleeding. . ; ; Genitourinary: +dysuria. Negative for flank pain and hematuria. ; ; GYN:  No vaginal bleeding, no vaginal discharge, no vulvar pain. ;; Musculoskeletal: Negative for back pain and neck pain. Negative for swelling and trauma.; ; Skin: Negative for pruritus, rash, abrasions, blisters, bruising and skin lesion.; ; Neuro: Negative for headache, lightheadedness and neck stiffness. Negative for weakness, altered level of consciousness , altered mental status, extremity weakness, paresthesias, involuntary movement, seizure and syncope.      Allergies  Review of patient's allergies indicates no known allergies.  Home Medications   Prior to Admission medications   Medication Sig Start Date End Date Taking? Authorizing Provider  albuterol (PROVENTIL HFA;VENTOLIN HFA) 108 (90 BASE) MCG/ACT inhaler Inhale 2 puffs into the lungs as needed for wheezing or  shortness of breath.   Yes Historical Provider, MD  beclomethasone (QVAR) 80 MCG/ACT inhaler Inhale 2 puffs into the lungs 2 (two) times daily. 10/08/13  Yes Reuben Likes, MD  buPROPion [facility] SR) 100 MG 12 hr tablet Take 100 mg by mouth 2 (two) times daily.   Yes Historical Provider, MD  hydrOXYzine (ATARAX/VISTARIL) 25 MG tablet Take 1 tablet (25 mg total) by mouth every 4 (four) hours as needed for itching. 11/01/13  Yes Adrian Blackwater Baker, PA-C  ibuprofen (ADVIL,MOTRIN) 200 MG tablet Take 800 mg by mouth every 6 (six) hours as needed for moderate pain.   Yes Historical Provider, MD   BP 153/100   Pulse 64  Temp(Src) 98.9 F (37.2 C) (Oral)  Resp 18  SpO2 100%  LMP 05/30/2013 Physical Exam 2115: Physical examination:  Nursing notes reviewed; Vital signs and O2 SAT reviewed;  Constitutional: Well developed, Well nourished, Well hydrated, In no acute distress; Head:  Normocephalic, atraumatic; Eyes: EOMI, PERRL, No scleral icterus; ENMT: Mouth and pharynx normal, Mucous membranes moist; Neck: Supple, Full range of motion, No lymphadenopathy; Cardiovascular: Regular rate and rhythm, No murmur, rub, or gallop; Respiratory: Breath sounds clear & equal bilaterally, No rales, rhonchi, wheezes.  Speaking full sentences with ease, Normal respiratory effort/excursion; Chest: Nontender, Movement normal; Abdomen: Soft, Nontender, Nondistended, Normal bowel sounds; Genitourinary: No CVA tenderness. Pelvic exam performed with permission of pt and female ED tech assist during exam.  External genitalia w/o lesions. Vaginal vault with thick white discharge.  Cervix w/o lesions, not friable, GC/chlam and wet prep obtained and sent to lab.  Bimanual exam w/o CMT, uterine or adnexal tenderness.; Extremities: Pulses normal, No tenderness, No edema, No calf edema or asymmetry.; Neuro: AA&Ox3, Major CN grossly intact.  Speech clear. No gross focal motor or sensory deficits in extremities. Climbs on and off stretcher easily by herself. Gait steady.; Skin: Color normal, Warm, Dry.   ED Course  Procedures    MDM  MDM Reviewed: previous chart, nursing note and vitals Reviewed previous: labs and ultrasound Interpretation: labs and CT scan    Results for orders placed during the hospital encounter of 02/07/14  WET PREP, GENITAL      Result Value Ref Range   Yeast Wet Prep HPF POC NONE SEEN  NONE SEEN   Trich, Wet Prep NONE SEEN  NONE SEEN   Clue Cells Wet Prep HPF POC RARE (*) NONE SEEN   WBC, Wet Prep HPF POC NONE SEEN  NONE SEEN  COMPREHENSIVE METABOLIC PANEL      Result Value Ref Range   Sodium 139  137  - 147 mEq/L   Potassium 4.2  3.7 - 5.3 mEq/L   Chloride 102  96 - 112 mEq/L   CO2 26  19 - 32 mEq/L   Glucose, Bld 91  70 - 99 mg/dL   BUN 9  6 - 23 mg/dL   Creatinine, Ser 1.61 (*) 0.50 - 1.10 mg/dL   Calcium 9.6  8.4 - 09.6 mg/dL   Total Protein 7.1  6.0 - 8.3 g/dL   Albumin 3.9  3.5 - 5.2 g/dL   AST 24  0 - 37 U/L   ALT 16  0 - 35 U/L   Alkaline Phosphatase 72  39 - 117 U/L   Total Bilirubin 0.6  0.3 - 1.2 mg/dL   GFR calc non Af Amer 65 (*) >90 mL/min   GFR calc Af Amer 76 (*) >90 mL/min   Anion gap 11  5 - 15  CBC WITH DIFFERENTIAL      Result Value Ref Range   WBC 6.4  4.0 - 10.5 K/uL   RBC 4.53  3.87 - 5.11 MIL/uL   Hemoglobin 14.2  12.0 - 15.0 g/dL   HCT 16.1  09.6 - 04.5 %   MCV 94.3  78.0 - 100.0 fL   MCH 31.3  26.0 - 34.0 pg   MCHC 33.3  30.0 - 36.0 g/dL   RDW 40.9  81.1 - 91.4 %   Platelets 307  150 - 400 K/uL   Neutrophils Relative % 57  43 - 77 %   Neutro Abs 3.7  1.7 - 7.7 K/uL   Lymphocytes Relative 29  12 - 46 %   Lymphs Abs 1.9  0.7 - 4.0 K/uL   Monocytes Relative 10  3 - 12 %   Monocytes Absolute 0.7  0.1 - 1.0 K/uL   Eosinophils Relative 2  0 - 5 %   Eosinophils Absolute 0.1  0.0 - 0.7 K/uL   Basophils Relative 2 (*) 0 - 1 %   Basophils Absolute 0.1  0.0 - 0.1 K/uL  LIPASE, BLOOD      Result Value Ref Range   Lipase 42  11 - 59 U/L  PREGNANCY, URINE      Result Value Ref Range   Preg Test, Ur NEGATIVE  NEGATIVE  URINALYSIS, ROUTINE W REFLEX MICROSCOPIC      Result Value Ref Range   Color, Urine YELLOW  YELLOW   APPearance CLEAR  CLEAR   Specific Gravity, Urine 1.018  1.005 - 1.030   pH 7.5  5.0 - 8.0   Glucose, UA NEGATIVE  NEGATIVE mg/dL   Hgb urine dipstick NEGATIVE  NEGATIVE   Bilirubin Urine NEGATIVE  NEGATIVE   Ketones, ur NEGATIVE  NEGATIVE mg/dL   Protein, ur NEGATIVE  NEGATIVE mg/dL   Urobilinogen, UA 1.0  0.0 - 1.0 mg/dL   Nitrite NEGATIVE  NEGATIVE   Leukocytes, UA NEGATIVE  NEGATIVE   Ct Abdomen Pelvis Wo Contrast 02/07/2014    CLINICAL DATA:  Bilateral flank pain extending into the pelvis for several weeks  EXAM: CT ABDOMEN AND PELVIS WITHOUT CONTRAST  TECHNIQUE: Multidetector CT imaging of the abdomen and pelvis was performed following the standard protocol without IV contrast.  COMPARISON:  07/14/2006  FINDINGS: Visualized portions of the lung bases are clear. No acute musculoskeletal findings.  Liver is normal. There is a 4 mm stone in the region of the neck of the gallbladder with no evidence of surrounding inflammatory change. Spleen is normal. Pancreas is normal.  Adrenal glands are normal. Right kidney is somewhat atrophic. Left kidney is normal. No evidence of nephroureterolithiasis or hydroureteronephrosis. No perinephric inflammatory change on either side.  Stomach is normal. Small bowel is normal. Large bowel is normal. There is mild to moderate fecal retention particularly in the sigmoid colon and rectum.  Bladder and reproductive organs are normal.  No free fluid.  Aortoiliac vessels appear normal. No significant adenopathy. Appendix appears normal.  IMPRESSION: Fecal retention suggesting an element of constipation.  Small gallstone.  Mild right renal atrophy.   Electronically Signed   By: Esperanza Heir M.D.   On: 02/07/2014 22:44    2250:  Will tx for BV and constipation. Workup otherwise reassuring. Dx and testing d/w pt.  Questions answered.  Verb understanding, agreeable to d/c home with outpt f/u.   Samuel Jester, DO 02/09/14 1749

## 2014-02-07 NOTE — Progress Notes (Signed)
  CARE MANAGEMENT ED NOTE 02/07/2014  Patient:  [name], [name]   Account Number:  [id number]  Date Initiated:  02/07/2014  Documentation initiated by:  Radford Pax  Subjective/Objective Assessment:   Patent presents to ED with abdominal, lower back pain and nausea for couple weeks.     Subjective/Objective Assessment Detail:     Action/Plan:   Action/Plan Detail:   Anticipated DC Date:       Status Recommendation to Physician:   Result of Recommendation:    Other ED Services  Consult Working Plan    DC Planning Services  Other  PCP issues    Choice offered to / List presented to:            Status of service:  Completed, signed off  ED Comments:   ED Comments Detail:  EDCM spoke to patient at bedside.  Patient confirms she does not have a pcp, with Medicaid insurance.  [facility] provided patient with a list of pcps who accept Medicaid in [place].  Patient thankful for resources.  No further EDCM needs at this time.

## 2014-02-08 LAB — GC/CHLAMYDIA PROBE AMP
CT Probe RNA: NEGATIVE
GC Probe RNA: NEGATIVE

## 2014-03-20 ENCOUNTER — Emergency Department (HOSPITAL_COMMUNITY)
Admission: EM | Admit: 2014-03-20 | Discharge: 2014-03-20 | Disposition: A | Discharge status: Home / Self Care | Payer: Medicaid Other | Attending: Emergency Medicine | Admitting: Emergency Medicine

## 2014-03-20 ENCOUNTER — Emergency Department (HOSPITAL_COMMUNITY): Payer: Medicaid Other

## 2014-03-20 ENCOUNTER — Encounter (HOSPITAL_COMMUNITY): Payer: Self-pay | Admitting: *Deleted

## 2014-03-20 DIAGNOSIS — I10 Essential (primary) hypertension: Secondary | ICD-10-CM | POA: Insufficient documentation

## 2014-03-20 DIAGNOSIS — Z7951 Long term (current) use of inhaled steroids: Secondary | ICD-10-CM | POA: Insufficient documentation

## 2014-03-20 DIAGNOSIS — Z87442 Personal history of urinary calculi: Secondary | ICD-10-CM | POA: Diagnosis not present

## 2014-03-20 DIAGNOSIS — R05 Cough: Secondary | ICD-10-CM | POA: Diagnosis present

## 2014-03-20 DIAGNOSIS — J069 Acute upper respiratory infection, unspecified: Secondary | ICD-10-CM | POA: Insufficient documentation

## 2014-03-20 DIAGNOSIS — Z792 Long term (current) use of antibiotics: Secondary | ICD-10-CM | POA: Diagnosis not present

## 2014-03-20 DIAGNOSIS — Z79899 Other long term (current) drug therapy: Secondary | ICD-10-CM | POA: Diagnosis not present

## 2014-03-20 DIAGNOSIS — J45901 Unspecified asthma with (acute) exacerbation: Secondary | ICD-10-CM | POA: Insufficient documentation

## 2014-03-20 DIAGNOSIS — R059 Cough, unspecified: Secondary | ICD-10-CM

## 2014-03-20 DIAGNOSIS — F419 Anxiety disorder, unspecified: Secondary | ICD-10-CM | POA: Diagnosis not present

## 2014-03-20 DIAGNOSIS — Z720 Tobacco use: Secondary | ICD-10-CM | POA: Diagnosis not present

## 2014-03-20 DIAGNOSIS — Z8742 Personal history of other diseases of the female genital tract: Secondary | ICD-10-CM | POA: Diagnosis not present

## 2014-03-20 MED ORDER — PREDNISONE 20 MG PO TABS: 40.0000 mg | ORAL_TABLET | Freq: Every day | ORAL | Status: DC

## 2014-03-20 MED ORDER — PREDNISONE 20 MG PO TABS
60.0000 mg | ORAL_TABLET | Freq: Once | ORAL | Status: AC
  Administered 2014-03-20: 60 mg via ORAL
  Filled 2014-03-20: qty 3

## 2014-03-20 MED ORDER — IPRATROPIUM BROMIDE 0.02 % IN SOLN
0.5000 mg | Freq: Once | RESPIRATORY_TRACT | Status: AC
  Administered 2014-03-20: 0.5 mg via RESPIRATORY_TRACT
  Filled 2014-03-20: qty 2.5

## 2014-03-20 MED ORDER — BECLOMETHASONE DIPROPIONATE 80 MCG/ACT IN AERS: 2.0000 | INHALATION_SPRAY | Freq: Two times a day (BID) | RESPIRATORY_TRACT | Status: DC

## 2014-03-20 MED ORDER — ALBUTEROL SULFATE HFA 108 (90 BASE) MCG/ACT IN AERS: 2.0000 | INHALATION_SPRAY | RESPIRATORY_TRACT | Status: DC | PRN

## 2014-03-20 MED ORDER — ALBUTEROL SULFATE (2.5 MG/3ML) 0.083% IN NEBU
5.0000 mg | INHALATION_SOLUTION | Freq: Once | RESPIRATORY_TRACT | Status: AC
  Administered 2014-03-20: 5 mg via RESPIRATORY_TRACT
  Filled 2014-03-20: qty 6

## 2014-03-20 NOTE — ED Notes (Signed)
Declined W/C at D/C and was escorted to lobby by RN. 

## 2014-03-20 NOTE — ED Notes (Signed)
Pt comes c/o cough with green mucous x 1 weeks. Denies fevers. Others in home have similar sx. No meds PTA. Pt alert, appropriate in triage.

## 2014-03-20 NOTE — Discharge Instructions (Signed)
Upper Respiratory Infection, Adult An upper respiratory infection (URI) is also sometimes known as the common cold. The upper respiratory tract includes the nose, sinuses, throat, trachea, and bronchi. Bronchi are the airways leading to the lungs. Most people improve within 1 week, but symptoms can last up to 2 weeks. A residual cough may last even longer.  CAUSES Many different viruses can infect the tissues lining the upper respiratory tract. The tissues become irritated and inflamed and often become very moist. Mucus production is also common. A cold is contagious. You can easily spread the virus to others by oral contact. This includes kissing, sharing a glass, coughing, or sneezing. Touching your mouth or nose and then touching a surface, which is then touched by another person, can also spread the virus. SYMPTOMS  Symptoms typically develop 1 to 3 days after you come in contact with a cold virus. Symptoms vary from person to person. They may include:  Runny nose.  Sneezing.  Nasal congestion.  Sinus irritation.  Sore throat.  Loss of voice (laryngitis).  Cough.  Fatigue.  Muscle aches.  Loss of appetite.  Headache.  Low-grade fever. DIAGNOSIS  You might diagnose your own cold based on familiar symptoms, since most people get a cold 2 to 3 times a year. Your caregiver can confirm this based on your exam. Most importantly, your caregiver can check that your symptoms are not due to another disease such as strep throat, sinusitis, pneumonia, asthma, or epiglottitis. Blood tests, throat tests, and X-rays are not necessary to diagnose a common cold, but they may sometimes be helpful in excluding other more serious diseases. Your caregiver will decide if any further tests are required. RISKS AND COMPLICATIONS  You may be at risk for a more severe case of the common cold if you smoke cigarettes, have chronic heart disease (such as heart failure) or lung disease (such as asthma), or if  you have a weakened immune system. The very young and very old are also at risk for more serious infections. Bacterial sinusitis, middle ear infections, and bacterial pneumonia can complicate the common cold. The common cold can worsen asthma and chronic obstructive pulmonary disease (COPD). Sometimes, these complications can require emergency medical care and may be life-threatening. PREVENTION  The best way to protect against getting a cold is to practice good hygiene. Avoid oral or hand contact with people with cold symptoms. Wash your hands often if contact occurs. There is no clear evidence that vitamin C, vitamin E, echinacea, or exercise reduces the chance of developing a cold. However, it is always recommended to get plenty of rest and practice good nutrition. TREATMENT  Treatment is directed at relieving symptoms. There is no cure. Antibiotics are not effective, because the infection is caused by a virus, not by bacteria. Treatment may include:  Increased fluid intake. Sports drinks offer valuable electrolytes, sugars, and fluids.  Breathing heated mist or steam (vaporizer or shower).  Eating chicken soup or other clear broths, and maintaining good nutrition.  Getting plenty of rest.  Using gargles or lozenges for comfort.  Controlling fevers with ibuprofen or acetaminophen as directed by your caregiver.  Increasing usage of your inhaler if you have asthma. Zinc gel and zinc lozenges, taken in the first 24 hours of the common cold, can shorten the duration and lessen the severity of symptoms. Pain medicines may help with fever, muscle aches, and throat pain. A variety of non-prescription medicines are available to treat congestion and runny nose. Your caregiver   can make recommendations and may suggest nasal or lung inhalers for other symptoms.  HOME CARE INSTRUCTIONS   Only take over-the-counter or prescription medicines for pain, discomfort, or fever as directed by your  caregiver.  Use a warm mist humidifier or inhale steam from a shower to increase air moisture. This may keep secretions moist and make it easier to breathe.  Drink enough water and fluids to keep your urine clear or pale yellow.  Rest as needed.  Return to work when your temperature has returned to normal or as your caregiver advises. You may need to stay home longer to avoid infecting others. You can also use a face mask and careful hand washing to prevent spread of the virus. SEEK MEDICAL CARE IF:   After the first few days, you feel you are getting worse rather than better.  You need your caregiver's advice about medicines to control symptoms.  You develop chills, worsening shortness of breath, or brown or red sputum. These may be signs of pneumonia.  You develop yellow or brown nasal discharge or pain in the face, especially when you bend forward. These may be signs of sinusitis.  You develop a fever, swollen neck glands, pain with swallowing, or white areas in the back of your throat. These may be signs of strep throat. SEEK IMMEDIATE MEDICAL CARE IF:   You have a fever.  You develop severe or persistent headache, ear pain, sinus pain, or chest pain.  You develop wheezing, a prolonged cough, cough up blood, or have a change in your usual mucus (if you have chronic lung disease).  You develop sore muscles or a stiff neck. Document Released: 10/30/2000 Document Revised: 07/29/2011 Document Reviewed: 08/11/2013 Exit[employer] Patient Information 2015 [employer]. This information is not intended to replace advice given to you by your health care provider. Make sure you discuss any questions you have with your health care provider.  

## 2014-03-20 NOTE — ED Provider Notes (Signed)
CSN: [id number]     Arrival date & time 03/20/14  1349 History  This chart was scribed for non-physician practitioner, Junious SilkHannah Morgann Woodburn, PA-C,working with Lyanne CoKevin M Campos, MD, by Karle PlumberJennifer Tensley, ED Scribe. This patient was seen in room TR05C/TR05C and the patient's care was started at 3:45 PM.  Chief Complaint  Patient presents with  . Cough   Patient is a 33 y.o. female presenting with cough. The history is provided by the patient. No language interpreter was used.  Cough Associated symptoms: rhinorrhea and wheezing   Associated symptoms: no chills, no ear pain, no fever and no sore throat     HPI Comments:  [name] is a 33 y.o. female with PMH of asthma and HTN who presents to the Emergency Department complaining of a productive cough of green mucus that started one week ago. She reports associated wheezing and rhinorrhea. Reports sick contacts in the house. Reports using her inhalers (last time about 7 hours ago) and taking OTC cough medication with no significant relief of symptoms. Pt reports last taking Tylenol yesterday. Denies worsening factors. Denies otalgia, sore throat, fever, chills, nausea or vomiting.  Past Medical History  Diagnosis Date  . Asthma   . Hypertension   . Anxiety   . Pregnancy induced hypertension   . Kidney stones   . Ovarian cyst   . Vaginal Pap smear, abnormal     bx, f/u ok  . Depression     doing good, not on meds now  . Missed abortion 10/02/2013   Past Surgical History  Procedure Laterality Date  . Cesarean section    . Dilation and curettage of uterus    . Dilation and evacuation N/A 10/04/2013    Procedure: DILATATION AND EVACUATION;  Surgeon: Sherian ReinJody Bovard-Stuckert, MD;  Location: [facility];  Service: Gynecology;  Laterality: N/A;  US in room    Family History  Problem Relation Age of Onset  . Hearing loss Mother   . Diabetes Son   . Cancer Maternal Grandfather     lung  . Liver disease Maternal Grandfather   . Cancer Paternal  Grandmother    History  Substance Use Topics  . Smoking status: Current Some Day Smoker    Types: Cigarettes  . Smokeless tobacco: Never Used     Comment: quit with positive preg test  . Alcohol Use: No   OB History    Gravida Para Term Preterm AB TAB SAB Ectopic Multiple Living   3 1 1  0 1 0 1 0 0 1     Review of Systems  Constitutional: Negative for fever and chills.  HENT: Positive for congestion and rhinorrhea. Negative for ear pain and sore throat.   Respiratory: Positive for cough and wheezing.   Gastrointestinal: Negative for nausea and vomiting.  All other systems reviewed and are negative.   Allergies  Review of patient's allergies indicates no known allergies.  Home Medications   Prior to Admission medications   Medication Sig Start Date End Date Taking? Authorizing Provider  albuterol (PROVENTIL HFA;VENTOLIN HFA) 108 (90 BASE) MCG/ACT inhaler Inhale 2 puffs into the lungs as needed for wheezing or shortness of breath.    Historical Provider, MD  beclomethasone (QVAR) 80 MCG/ACT inhaler Inhale 2 puffs into the lungs 2 (two) times daily. 10/08/13   Reuben Likesavid C Keller, MD  buPROPion [facility](WELLBUTRIN SR) 100 MG 12 hr tablet Take 100 mg by mouth 2 (two) times daily.    Historical Provider, MD  hydrOXYzine (ATARAX/VISTARIL)  25 MG tablet Take 1 tablet (25 mg total) by mouth every 4 (four) hours as needed for itching. 11/01/13   Graylon GoodZachary H Baker, PA-C  ibuprofen (ADVIL,MOTRIN) 200 MG tablet Take 800 mg by mouth every 6 (six) hours as needed for moderate pain.    Historical Provider, MD  metroNIDAZOLE (FLAGYL) 500 MG tablet Take 1 tablet (500 mg total) by mouth 2 (two) times daily. 02/07/14   Samuel JesterKathleen McManus, DO   Triage Vitals: BP 143/101 mmHg  Pulse 90  Temp(Src) 97.9 F (36.6 C) (Oral)  Resp 18  SpO2 100%  LMP 06/06/2013 Physical Exam  Constitutional: She is oriented to person, place, and time. She appears well-developed and well-nourished. No distress.  HENT:  Head:  Normocephalic and atraumatic.  Right Ear: Tympanic membrane and external ear normal.  Left Ear: Tympanic membrane and external ear normal.  Nose: Nose normal.  Mouth/Throat: Uvula is midline, oropharynx is clear and moist and mucous membranes are normal.  Eyes: Conjunctivae are normal.  Neck: Normal range of motion.  Cardiovascular: Normal rate, regular rhythm and normal heart sounds.   Pulmonary/Chest: Effort normal. No stridor. No respiratory distress. She has wheezes (noted throughout). She has no rales.  Abdominal: Soft. She exhibits no distension.  Musculoskeletal: Normal range of motion.  Neurological: She is alert and oriented to person, place, and time. She has normal strength.  Skin: Skin is warm and dry. She is not diaphoretic. No erythema.  Psychiatric: She has a normal mood and affect. Her behavior is normal.  Nursing note and vitals reviewed.   ED Course  Procedures (including critical care time) DIAGNOSTIC STUDIES: Oxygen Saturation is 100% on RA, normal by my interpretation.   COORDINATION OF CARE: 3:49 PM- Will order CXR and Prednisone. Pt verbalizes understanding and agrees to plan.  Medications  albuterol (PROVENTIL) (2.5 MG/3ML) 0.083% nebulizer solution 5 mg (5 mg Nebulization Given 03/20/14 1632)  predniSONE (DELTASONE) tablet 60 mg (60 mg Oral Given 03/20/14 1630)  ipratropium (ATROVENT) nebulizer solution 0.5 mg (0.5 mg Nebulization Given 03/20/14 1633)    Labs Review Labs Reviewed - No data to display  Imaging Review Dg Chest 2 View  03/20/2014   CLINICAL DATA:  Shortness of breath with a productive cough with green mucus. History of hypertension and asthma.  EXAM: CHEST  2 VIEW  COMPARISON:  05/22/2013  FINDINGS: The heart size and mediastinal contours are within normal limits. Both lungs are clear. The visualized skeletal structures are unremarkable.  IMPRESSION: No active cardiopulmonary disease.   Electronically Signed   By: Richarda OverlieAdam  Henn M.D.   On:  03/20/2014 16:48      EKG Interpretation None      MDM   Final diagnoses:  URI (upper respiratory infection)    Pt CXR negative for acute infiltrate. Patients symptoms are consistent with URI, likely viral etiology. Discussed that antibiotics are not indicated for viral infections. Patient does have diffuse wheezing. Will give prednisone burst.  Pt will be discharged with symptomatic treatment.  Verbalizes understanding and is agreeable with plan. Pt is hemodynamically stable & in NAD prior to dc.   I personally performed the services described in this documentation, which was scribed in my presence. The recorded information has been reviewed and is accurate.    Mora BellmanHannah S Haik Mahoney, PA-C 03/23/14 1038  Lyanne CoKevin M Campos, MD 03/23/14 2322

## 2014-03-21 ENCOUNTER — Encounter (HOSPITAL_COMMUNITY): Payer: Self-pay | Admitting: *Deleted

## 2014-04-04 ENCOUNTER — Ambulatory Visit: Payer: Medicaid Other

## 2014-04-06 ENCOUNTER — Ambulatory Visit: Payer: Medicaid Other

## 2014-06-15 ENCOUNTER — Encounter (HOSPITAL_COMMUNITY): Payer: Self-pay

## 2014-06-15 ENCOUNTER — Emergency Department (INDEPENDENT_AMBULATORY_CARE_PROVIDER_SITE_OTHER)
Admission: EM | Admit: 2014-06-15 | Discharge: 2014-06-15 | Disposition: A | Discharge status: Home / Self Care | Payer: Self-pay | Source: Home / Self Care | Attending: Emergency Medicine | Admitting: Emergency Medicine

## 2014-06-15 DIAGNOSIS — J209 Acute bronchitis, unspecified: Secondary | ICD-10-CM

## 2014-06-15 MED ORDER — PREDNISONE 20 MG PO TABS: 40.0000 mg | ORAL_TABLET | Freq: Every day | ORAL | Status: DC

## 2014-06-15 MED ORDER — IPRATROPIUM-ALBUTEROL 0.5-2.5 (3) MG/3ML IN SOLN
3.0000 mL | Freq: Once | RESPIRATORY_TRACT | Status: AC
  Administered 2014-06-15: 3 mL via RESPIRATORY_TRACT

## 2014-06-15 MED ORDER — AZITHROMYCIN 250 MG PO TABS: ORAL_TABLET | ORAL | Status: DC

## 2014-06-15 MED ORDER — IPRATROPIUM-ALBUTEROL 0.5-2.5 (3) MG/3ML IN SOLN
RESPIRATORY_TRACT | Status: AC
  Filled 2014-06-15: qty 3

## 2014-06-15 NOTE — Discharge Instructions (Signed)
You have bronchitis. Take prednisone 2 tablets daily for 5 days. Take the Z-Pak as prescribed. [facility]  You may benefit from seeing a urologist for the difficulty voiding. Follow-up as needed.

## 2014-06-15 NOTE — ED Provider Notes (Signed)
CSN: [id number]     Arrival date & time 06/15/14  1320 History   First MD Initiated Contact with Patient 06/15/14 1421     Chief Complaint  Patient presents with  . Cough   (Consider location/radiation/quality/duration/timing/severity/associated sxs/prior Treatment) HPI  She is a 34 year old woman here for evaluation of cough. She states this started about 10 days ago. At that time she also had nasal congestion and rhinorrhea. She states these have mostly improved, except for her cough which has been worsening. It is productive of yellow to green sputum. She denies shortness of breath. She does report some chest tightness that is minimally improved with her albuterol inhaler. She does have a history of asthma.  She also reports difficulty voiding since a D&C in May 2015. She states she has followed up with the [facility] provider and has had a course of antibiotics without improvement.  Past Medical History  Diagnosis Date  . Asthma   . Hypertension   . Anxiety   . Pregnancy induced hypertension   . Kidney stones   . Ovarian cyst   . Vaginal Pap smear, abnormal     bx, f/u ok  . Depression     doing good, not on meds now  . Missed abortion 10/02/2013   Past Surgical History  Procedure Laterality Date  . Cesarean section    . Dilation and curettage of uterus    . Dilation and evacuation N/A 10/04/2013    Procedure: DILATATION AND EVACUATION;  Surgeon: Sherian Rein, MD;  Location: [facility];  Service: Gynecology;  Laterality: N/A;  [place] in room    Family History  Problem Relation Age of Onset  . Hearing loss Mother   . Diabetes Son   . Cancer Maternal Grandfather     lung  . Liver disease Maternal Grandfather   . Cancer Paternal Grandmother    History  Substance Use Topics  . Smoking status: Current Some Day Smoker    Types: Cigarettes  . Smokeless tobacco: Never Used     Comment: quit with positive preg test  . Alcohol Use: No   OB History    Gravida Para Term Preterm AB  TAB SAB Ectopic Multiple Living   0 1 0 1 0 0 1     Review of Systems  Constitutional: Negative for fever and chills.  HENT: Negative for congestion, rhinorrhea and sore throat.   Respiratory: Positive for cough and chest tightness. Negative for shortness of breath.   Cardiovascular: Negative for chest pain.    Allergies  Review of patient's allergies indicates no known allergies.  Home Medications   Prior to Admission medications   Medication Sig Start Date End Date Taking? Authorizing Provider  albuterol (PROVENTIL HFA;VENTOLIN HFA) 108 (90 BASE) MCG/ACT inhaler Inhale 2 puffs into the lungs every 2 (two) hours as needed for wheezing or shortness of breath (cough). 03/20/14  Yes Mora Bellman, PA-C  beclomethasone (QVAR) 80 MCG/ACT inhaler Inhale 2 puffs into the lungs 2 (two) times daily. 03/20/14  Yes Mora Bellman, PA-C  albuterol (PROVENTIL HFA;VENTOLIN HFA) 108 (90 BASE) MCG/ACT inhaler Inhale 2 puffs into the lungs as needed for wheezing or shortness of breath.    Historical Provider, MD  azithromycin (ZITHROMAX Z-PAK) 250 MG tablet Take 2 pills today, then 1 pill daily until gone. 06/15/14   Charm Rings, MD  beclomethasone (QVAR) 80 MCG/ACT inhaler Inhale 2 puffs into the lungs 2 (two) times daily. 10/08/13  Reuben Likesavid C Keller, MD  buPROPion [facility](WELLBUTRIN SR) 100 MG 12 hr tablet Take 100 mg by mouth 2 (two) times daily.    Historical Provider, MD  hydrOXYzine (ATARAX/VISTARIL) 25 MG tablet Take 1 tablet (25 mg total) by mouth every 4 (four) hours as needed for itching. 11/01/13   Graylon GoodZachary H Baker, PA-C  ibuprofen (ADVIL,MOTRIN) 200 MG tablet Take 800 mg by mouth every 6 (six) hours as needed for moderate pain.    Historical Provider, MD  metroNIDAZOLE (FLAGYL) 500 MG tablet Take 1 tablet (500 mg total) by mouth 2 (two) times daily. 02/07/14   Samuel JesterKathleen McManus, DO  predniSONE (DELTASONE) 20 MG tablet Take 2 tablets (40 mg total) by mouth daily. 06/15/14   Charm RingsErin J Honig, MD   BP  153/100 mmHg  Pulse 85  Temp(Src) 97.8 F (36.6 C) (Oral)  SpO2 99%  LMP 05/15/2014 (Approximate)  Breastfeeding? No Physical Exam  Constitutional: She appears well-developed and well-nourished. No distress.  Neck: Neck supple.  Cardiovascular: Normal rate, regular rhythm and normal heart sounds.   No murmur heard. Pulmonary/Chest: Effort normal. No respiratory distress. She has wheezes. She has no rales.    ED Course  Procedures (including critical care time) Labs Review Labs Reviewed - No data to display  Imaging Review No results found.   MDM   1. Acute bronchitis, unspecified organism    We'll treat with DuoNeb. She reports minimal subjective improvement after treatment, but her wheezing is improved.  We'll treat with a Z-Pak and prednisone. Recommended urologist for difficulty voiding. Maggy saw the patient for financial aid assistance. Follow-up as needed.  Charm RingsErin J Honig, MD 06/15/14 [phone]

## 2014-06-15 NOTE — ED Notes (Signed)
C/o cough, congestion green and yellow secretions. Also c/o trouble emptying bladder since had D&C in 09-2013

## 2014-07-01 ENCOUNTER — Encounter (HOSPITAL_COMMUNITY): Payer: Self-pay | Admitting: Emergency Medicine

## 2014-07-01 ENCOUNTER — Emergency Department (HOSPITAL_COMMUNITY)
Admission: EM | Admit: 2014-07-01 | Discharge: 2014-07-01 | Disposition: A | Discharge status: Home / Self Care | Payer: Medicaid Other | Attending: Emergency Medicine | Admitting: Emergency Medicine

## 2014-07-01 DIAGNOSIS — N644 Mastodynia: Secondary | ICD-10-CM | POA: Diagnosis present

## 2014-07-01 DIAGNOSIS — Z7951 Long term (current) use of inhaled steroids: Secondary | ICD-10-CM | POA: Diagnosis not present

## 2014-07-01 DIAGNOSIS — Z87442 Personal history of urinary calculi: Secondary | ICD-10-CM | POA: Diagnosis not present

## 2014-07-01 DIAGNOSIS — Z79899 Other long term (current) drug therapy: Secondary | ICD-10-CM | POA: Diagnosis not present

## 2014-07-01 DIAGNOSIS — F419 Anxiety disorder, unspecified: Secondary | ICD-10-CM | POA: Insufficient documentation

## 2014-07-01 DIAGNOSIS — Z720 Tobacco use: Secondary | ICD-10-CM | POA: Diagnosis not present

## 2014-07-01 DIAGNOSIS — I10 Essential (primary) hypertension: Secondary | ICD-10-CM | POA: Insufficient documentation

## 2014-07-01 DIAGNOSIS — N63 Unspecified lump in breast: Secondary | ICD-10-CM | POA: Diagnosis not present

## 2014-07-01 DIAGNOSIS — J45909 Unspecified asthma, uncomplicated: Secondary | ICD-10-CM | POA: Diagnosis not present

## 2014-07-01 NOTE — ED Notes (Signed)
Pt states she noticed a lump in her left breast a month ago. The pain has gotten worse over the past week and she cannot sleep on that side. Pt denies redness or discharge. Pt states she feels as though that breast is larger than the right.

## 2014-07-01 NOTE — Discharge Instructions (Signed)

## 2014-07-01 NOTE — ED Provider Notes (Signed)
CSN: [id number]     Arrival date & time 07/01/14  8657 History   First MD Initiated Contact with Patient 07/01/14 [phone]     Chief Complaint  Patient presents with  . Breast Pain     (Consider location/radiation/quality/duration/timing/severity/associated sxs/prior Treatment) HPI Comments: Patient presents to the emergency department with chief complaint of left breast tenderness. She states that she noticed a lump in her breast about a month ago. She states that it has become painful over the past week. She denies any fevers, chills, discharge, erythema. She reports family history of breast cancer. She denies any other symptoms.  The history is provided by the patient. No language interpreter was used.    Past Medical History  Diagnosis Date  . Asthma   . Hypertension   . Anxiety   . Pregnancy induced hypertension   . Kidney stones   . Ovarian cyst   . Vaginal Pap smear, abnormal     bx, f/u ok  . Depression     doing good, not on meds now  . Missed abortion 10/02/2013   Past Surgical History  Procedure Laterality Date  . Cesarean section    . Dilation and curettage of uterus    . Dilation and evacuation N/A 10/04/2013    Procedure: DILATATION AND EVACUATION;  Surgeon: Sherian Rein, MD;  Location: [facility];  Service: Gynecology;  Laterality: N/A;  [place] in room    Family History  Problem Relation Age of Onset  . Hearing loss Mother   . Diabetes Son   . Cancer Maternal Grandfather     lung  . Liver disease Maternal Grandfather   . Cancer Paternal Grandmother    History  Substance Use Topics  . Smoking status: Current Some Day Smoker    Types: Cigarettes  . Smokeless tobacco: Never Used     Comment: quit with positive preg test  . Alcohol Use: Yes     Comment: very occassionally   OB History    Gravida Para Term Preterm AB TAB SAB Ectopic Multiple Living   0 1 0 1 0 0 1     Review of Systems  Constitutional: Negative for fever and chills.  Respiratory:  Negative for shortness of breath.   Cardiovascular: Negative for chest pain.  Gastrointestinal: Negative for nausea, vomiting, diarrhea and constipation.  Genitourinary: Negative for dysuria.      Allergies  Review of patient's allergies indicates no known allergies.  Home Medications   Prior to Admission medications   Medication Sig Start Date End Date Taking? Authorizing Provider  Acetaminophen-Caff-Pyrilamine (MIDOL COMPLETE PO) Take 2 tablets by mouth daily as needed (pain/cramping).   Yes Historical Provider, MD  albuterol (PROVENTIL HFA;VENTOLIN HFA) 108 (90 BASE) MCG/ACT inhaler Inhale 2 puffs into the lungs every 2 (two) hours as needed for wheezing or shortness of breath (cough). 03/20/14  Yes Mora Bellman, PA-C  beclomethasone (QVAR) 80 MCG/ACT inhaler Inhale 2 puffs into the lungs 2 (two) times daily. 10/08/13  Yes Reuben Likes, MD  buPROPion [facility] SR) 100 MG 12 hr tablet Take 100 mg by mouth 2 (two) times daily.   Yes Historical Provider, MD  FLUoxetine (PROZAC) 20 MG capsule Take 20 mg by mouth daily.   Yes Historical Provider, MD  hydrOXYzine (ATARAX/VISTARIL) 25 MG tablet Take 1 tablet (25 mg total) by mouth every 4 (four) hours as needed for itching. 11/01/13  Yes Adrian Blackwater Baker, PA-C  ibuprofen (ADVIL,MOTRIN) 200 MG tablet Take  800 mg by mouth every 6 (six) hours as needed for moderate pain.   Yes Historical Provider, MD  azithromycin (ZITHROMAX Z-PAK) 250 MG tablet Take 2 pills today, then 1 pill daily until gone. Patient not taking: Reported on 07/01/2014 06/15/14   Charm RingsErin J Honig, MD  beclomethasone (QVAR) 80 MCG/ACT inhaler Inhale 2 puffs into the lungs 2 (two) times daily. Patient not taking: Reported on 07/01/2014 03/20/14   Mora BellmanHannah S Merrell, PA-C  metroNIDAZOLE (FLAGYL) 500 MG tablet Take 1 tablet (500 mg total) by mouth 2 (two) times daily. Patient not taking: Reported on 07/01/2014 02/07/14   Samuel JesterKathleen McManus, DO  predniSONE (DELTASONE) 20 MG tablet Take 2  tablets (40 mg total) by mouth daily. Patient not taking: Reported on 07/01/2014 06/15/14   Charm RingsErin J Honig, MD   BP 140/91 mmHg  Temp(Src) 97.4 F (36.3 C) (Oral)  Resp 15  Ht 5\' 4"  (1.626 m)  Wt 160 lb (72.576 kg)  BMI 27.45 kg/m2  SpO2 99%  LMP 06/30/2014 Physical Exam  Constitutional: She is oriented to person, place, and time. She appears well-developed and well-nourished.  HENT:  Head: Normocephalic and atraumatic.  Eyes: Conjunctivae and EOM are normal. Pupils are equal, round, and reactive to light.  Neck: Normal range of motion. Neck supple.  Cardiovascular: Normal rate and regular rhythm.  Exam reveals no gallop and no friction rub.   No murmur heard. Pulmonary/Chest: Effort normal and breath sounds normal. No respiratory distress. She has no wheezes. She has no rales. She exhibits no tenderness.  Chaperone present for breast exam, left breast tenderness at the 4:00 position, no obvious masses, but exam complicated by fibrocystic breasts, no evidence of abscess or infection, no dimpling of the skin, no discharge from the nipple.  Abdominal: Soft. She exhibits no distension. There is no tenderness.  Musculoskeletal: Normal range of motion. She exhibits no edema or tenderness.  Neurological: She is alert and oriented to person, place, and time.  Skin: Skin is warm and dry.  Psychiatric: She has a normal mood and affect. Her behavior is normal. Judgment and thought content normal.  Nursing note and vitals reviewed.   ED Course  Procedures (including critical care time) Labs Review Labs Reviewed - No data to display  Imaging Review No results found.   EKG Interpretation None      MDM   Final diagnoses:  Breast tenderness in female    Patient with breast tenderness and reported lump, though I do not feel this on exam.  My exam was complicated by fibrocystic breasts.  Will have the patient follow up [facility] as  she has no PCP.   Roxy Horsemanobert Cariann Kinnamon, PA-C 07/01/14 [id number]  Toy CookeyMegan Docherty, MD 07/04/14 1018

## 2014-07-12 ENCOUNTER — Other Ambulatory Visit (HOSPITAL_COMMUNITY): Payer: Self-pay | Admitting: *Deleted

## 2014-07-12 DIAGNOSIS — N6320 Unspecified lump in the left breast, unspecified quadrant: Secondary | ICD-10-CM

## 2014-07-12 DIAGNOSIS — N644 Mastodynia: Secondary | ICD-10-CM

## 2014-07-21 ENCOUNTER — Encounter (HOSPITAL_COMMUNITY): Payer: Self-pay

## 2014-07-21 ENCOUNTER — Ambulatory Visit (HOSPITAL_COMMUNITY)
Admission: RE | Admit: 2014-07-21 | Discharge: 2014-07-21 | Disposition: A | Discharge status: Home / Self Care | Payer: Medicaid Other | Source: Ambulatory Visit | Attending: Obstetrics and Gynecology | Admitting: Obstetrics and Gynecology

## 2014-07-21 ENCOUNTER — Ambulatory Visit
Admission: RE | Admit: 2014-07-21 | Discharge: 2014-07-21 | Disposition: A | Discharge status: Home / Self Care | Payer: No Typology Code available for payment source | Source: Ambulatory Visit | Attending: Obstetrics and Gynecology | Admitting: Obstetrics and Gynecology

## 2014-07-21 VITALS — BP 126/84 | Temp 98.5°F | Ht 64.0 in | Wt 167.6 lb

## 2014-07-21 DIAGNOSIS — N6320 Unspecified lump in the left breast, unspecified quadrant: Secondary | ICD-10-CM

## 2014-07-21 DIAGNOSIS — Z1239 Encounter for other screening for malignant neoplasm of breast: Secondary | ICD-10-CM

## 2014-07-21 DIAGNOSIS — N644 Mastodynia: Secondary | ICD-10-CM

## 2014-07-21 DIAGNOSIS — N6321 Unspecified lump in the left breast, upper outer quadrant: Secondary | ICD-10-CM

## 2014-07-21 NOTE — Addendum Note (Signed)
Encounter addended by: Priscille Heidelberghristine P Brannock, RN on: 07/21/2014  2:00 PM<BR>     Documentation filed: Charges VN

## 2014-07-21 NOTE — Patient Instructions (Addendum)
Education materials on self breast awareness given. Explained to [name] that she did not need a Pap smear today due to last Pap smear was in September 2015 per patient. Due to patients recent history of an abnormal Pap smear let her know she will need a Pap smear in September. [facility]. Appointment scheduled for Thursday, July 21, 2014 at 1310. Patient aware of appointment and will be there.Smoking cessation discussed with patient. [facility] and given resources about free smoking cessation classes offered at the [facility]. [name] verbalized understanding.  Marijane Trower, Kathaleen Maserhristine Poll, RN 1:49 PM

## 2014-07-21 NOTE — Progress Notes (Signed)
Complaints of left breast lump x 3 months that is painful at times. Patient rates the pain at a 8 out of 10.   Pap Smear:  Pap smear not completed today. [facility] and normal per patient. Per patient has a history of an abnormal Pap smear 5 years ago that required a colposcopy for follow-up. Patient states she has had two normal Pap smears since colposcopy. [facility] is [facility] to get result of last Pap smear to verify she is correct on the date of last Pap smear. No Pap smear results in EPIC.  Physical exam: Breasts Breasts symmetrical. No skin abnormalities bilateral breasts. No nipple retraction bilateral breasts. No nipple discharge bilateral breasts. No lymphadenopathy. No lumps palpated right breast. Palpated a moveable lump within the left breast at 2 o'clock 4 cm from the nipple. No complaints of pain or tenderness on exam. [facility] for [facility] mamm[facility]. Appointment scheduled for Thursday, July 21, 2014 at 1310.     Pelvic/Bimanual No Pap smear completed today since last Pap smear was in September 2015 per patient. Pap smear not indicated per BCCCP guidelines.   Smoking cessation discussed with patient. Patient referred [facility] the [facility] and given resources about free smoking cessation classes offered at the [facility].

## 2014-07-29 ENCOUNTER — Encounter: Payer: Self-pay | Admitting: Internal Medicine

## 2014-07-29 ENCOUNTER — Ambulatory Visit: Payer: Medicaid Other | Attending: Internal Medicine | Admitting: Internal Medicine

## 2014-07-29 VITALS — BP 144/106 | HR 71 | Temp 98.0°F | Resp 16 | Wt 170.2 lb

## 2014-07-29 DIAGNOSIS — R350 Frequency of micturition: Secondary | ICD-10-CM | POA: Insufficient documentation

## 2014-07-29 DIAGNOSIS — F329 Major depressive disorder, single episode, unspecified: Secondary | ICD-10-CM

## 2014-07-29 DIAGNOSIS — F17200 Nicotine dependence, unspecified, uncomplicated: Secondary | ICD-10-CM | POA: Insufficient documentation

## 2014-07-29 DIAGNOSIS — J45909 Unspecified asthma, uncomplicated: Secondary | ICD-10-CM | POA: Insufficient documentation

## 2014-07-29 DIAGNOSIS — I10 Essential (primary) hypertension: Secondary | ICD-10-CM | POA: Diagnosis not present

## 2014-07-29 DIAGNOSIS — Z23 Encounter for immunization: Secondary | ICD-10-CM | POA: Diagnosis not present

## 2014-07-29 DIAGNOSIS — J45901 Unspecified asthma with (acute) exacerbation: Secondary | ICD-10-CM | POA: Insufficient documentation

## 2014-07-29 DIAGNOSIS — Z720 Tobacco use: Secondary | ICD-10-CM | POA: Diagnosis not present

## 2014-07-29 DIAGNOSIS — F418 Other specified anxiety disorders: Secondary | ICD-10-CM | POA: Insufficient documentation

## 2014-07-29 DIAGNOSIS — J4551 Severe persistent asthma with (acute) exacerbation: Secondary | ICD-10-CM | POA: Insufficient documentation

## 2014-07-29 DIAGNOSIS — F32A Depression, unspecified: Secondary | ICD-10-CM | POA: Insufficient documentation

## 2014-07-29 LAB — COMPLETE METABOLIC PANEL WITH GFR
ALK PHOS: 62 U/L (ref 39–117)
ALT: 17 U/L (ref 0–35)
AST: 27 U/L (ref 0–37)
Albumin: 4.3 g/dL (ref 3.5–5.2)
BUN: 12 mg/dL (ref 6–23)
CALCIUM: 9.7 mg/dL (ref 8.4–10.5)
CO2: 25 meq/L (ref 19–32)
Chloride: 103 mEq/L (ref 96–112)
Creat: 0.96 mg/dL (ref 0.50–1.10)
GFR, Est Non African American: 79 mL/min
Glucose, Bld: 86 mg/dL (ref 70–99)
Potassium: 5.4 mEq/L — ABNORMAL HIGH (ref 3.5–5.3)
Sodium: 138 mEq/L (ref 135–145)
Total Bilirubin: 0.5 mg/dL (ref 0.2–1.2)
Total Protein: 6.6 g/dL (ref 6.0–8.3)

## 2014-07-29 LAB — HEMOGLOBIN A1C
HEMOGLOBIN A1C: 5.2 % (ref <5.7)
Mean Plasma Glucose: 103 mg/dL (ref <117)

## 2014-07-29 MED ORDER — AMLODIPINE BESYLATE 2.5 MG PO TABS
2.5000 mg | ORAL_TABLET | Freq: Every day | ORAL | Status: DC
Start: 2014-07-29 — End: 2014-08-01

## 2014-07-29 MED ORDER — ALBUTEROL SULFATE HFA 108 (90 BASE) MCG/ACT IN AERS: 2.0000 | INHALATION_SPRAY | RESPIRATORY_TRACT | Status: DC | PRN

## 2014-07-29 MED ORDER — BECLOMETHASONE DIPROPIONATE 80 MCG/ACT IN AERS: 2.0000 | INHALATION_SPRAY | Freq: Two times a day (BID) | RESPIRATORY_TRACT | Status: DC

## 2014-07-29 MED ORDER — NICOTINE 21 MG/24HR TD PT24: 21.0000 mg | MEDICATED_PATCH | Freq: Every day | TRANSDERMAL | Status: DC

## 2014-07-29 NOTE — Progress Notes (Signed)
Patient Demographics  [name], is a 34 y.o. female  ZOX:[id number]  [id number]  DOB - 1980/12/22  CC:  Chief Complaint  Patient presents with  . Establish Care       HPI: [name] is a 34 y.o. female here today to establish medical care.patient has history of asthma and is currently on Qvar and albuterol when necessary, she ran out of her medications and is requesting refill, she also reported that her blood pressure has been high in the past patient has never been on any blood pressure medications, she also reports ?history of kidney stones, patient had CT abdomen done in September which was negative for any stones but she does complain of urinary frequency. Patient also smokes cigarettes, counseled patient to quit smoking. Patient also follows up with [facility] and is on depression medications including Prozac and Remeron. Patient denies any SI or HI. Patient has No headache, No chest pain, No abdominal pain - No Nausea, No new weakness tingling or numbness, No Cough - SOB.  No Known Allergies Past Medical History  Diagnosis Date  . Asthma   . Hypertension   . Anxiety   . Pregnancy induced hypertension   . Kidney stones   . Ovarian cyst   . Vaginal Pap smear, abnormal     bx, f/u ok  . Depression     doing good, not on meds now  . Missed abortion 10/02/2013   Current Outpatient Prescriptions on File Prior to Visit  Medication Sig Dispense Refill  . Acetaminophen-Caff-Pyrilamine (MIDOL COMPLETE PO) Take 2 tablets by mouth daily as needed (pain/cramping).    Marland Kitchen azithromycin (ZITHROMAX Z-PAK) 250 MG tablet Take 2 pills today, then 1 pill daily until gone. (Patient not taking: Reported on 07/01/2014) 6 tablet 0  . beclomethasone (QVAR) 80 MCG/ACT inhaler Inhale 2 puffs into the lungs 2 (two) times daily. (Patient not taking: Reported on 07/01/2014) 1 Inhaler 12  . buPROPion (WELLBUTRIN SR) 100 MG 12 hr tablet Take 100 mg by mouth 2 (two) times daily.    Marland Kitchen  FLUoxetine (PROZAC) 20 MG capsule Take 20 mg by mouth daily.    . hydrOXYzine (ATARAX/VISTARIL) 25 MG tablet Take 1 tablet (25 mg total) by mouth every 4 (four) hours as needed for itching. (Patient not taking: Reported on 07/21/2014) 20 tablet 0  . ibuprofen (ADVIL,MOTRIN) 200 MG tablet Take 800 mg by mouth every 6 (six) hours as needed for moderate pain.    . metroNIDAZOLE (FLAGYL) 500 MG tablet Take 1 tablet (500 mg total) by mouth 2 (two) times daily. (Patient not taking: Reported on 07/01/2014) 14 tablet 0  . predniSONE (DELTASONE) 20 MG tablet Take 2 tablets (40 mg total) by mouth daily. (Patient not taking: Reported on 07/01/2014) 10 tablet 0   No current facility-administered medications on file prior to visit.   Family History  Problem Relation Age of Onset  . Hearing loss Mother   . Asthma Mother   . Diabetes Son   . Cancer Maternal Grandfather     bone  . Liver disease Maternal Grandfather   . Asthma Father   . Asthma Sister    History   Social History  . Marital Status: Single    Spouse Name: N/A  . Number of Children: N/A  . Years of Education: N/A   Occupational History  . Not on file.   Social History Main Topics  . Smoking status: Current Some Day Smoker -- 0.50 packs/day for 15  years    Types: Cigarettes  . Smokeless tobacco: Never Used     Comment: quit with positive preg test  . Alcohol Use: No     Comment: very occassionally  . Drug Use: No  . Sexual Activity: Yes   Other Topics Concern  . Not on file   Social History Narrative    Review of Systems: Constitutional: Negative for fever, chills, diaphoresis, activity change, appetite change and fatigue. HENT: Negative for ear pain, nosebleeds, congestion, facial swelling, rhinorrhea, neck pain, neck stiffness and ear discharge.  Eyes: Negative for pain, discharge, redness, itching and visual disturbance. Respiratory: Negative for cough, choking, chest tightness, shortness of breath, wheezing and stridor.   Cardiovascular: Negative for chest pain, palpitations and leg swelling. Gastrointestinal: Negative for abdominal distention. Genitourinary: Negative for dysuria, urgency, frequency, hematuria, flank pain, decreased urine volume, difficulty urinating and dyspareunia.  Musculoskeletal: Negative for back pain, joint swelling, arthralgia and gait problem. Neurological: Negative for dizziness, tremors, seizures, syncope, facial asymmetry, speech difficulty, weakness, light-headedness, numbness and headaches.  Hematological: Negative for adenopathy. Does not bruise/bleed easily. Psychiatric/Behavioral: Negative for hallucinations, behavioral problems, confusion, dysphoric mood, decreased concentration and agitation.    Objective:   Filed Vitals:   07/29/14 1041  BP: 144/106  Pulse: 71  Temp:   Resp:     Physical Exam: Constitutional: Patient appears well-developed and well-nourished. No distress. HENT: Normocephalic, atraumatic, External right and left ear normal. Oropharynx is clear and moist.  Eyes: Conjunctivae and EOM are normal. PERRLA, no scleral icterus. Neck: Normal ROM. Neck supple. No JVD. No tracheal deviation. No thyromegaly. CVS: RRR, S1/S2 +, no murmurs, no gallops, no carotid bruit.  Pulmonary: Effort and breath sounds normal, no stridor, rhonchi, wheezes, rales.  Abdominal: Soft. BS +, no distension, tenderness, rebound or guarding.  Musculoskeletal: Normal range of motion. No edema and no tenderness.  Neuro: Alert. Normal reflexes, muscle tone coordination. No cranial nerve deficit. Skin: Skin is warm and dry. No rash noted. Not diaphoretic. No erythema. No pallor. Psychiatric: Normal mood and affect. Behavior, judgment, thought content normal.  Lab Results  Component Value Date   WBC 6.4 02/07/2014   HGB 14.2 02/07/2014   HCT 42.7 02/07/2014   MCV 94.3 02/07/2014   PLT 307 02/07/2014   Lab Results  Component Value Date   CREATININE 1.11* 02/07/2014   BUN 9  02/07/2014   NA 139 02/07/2014   K 4.2 02/07/2014   CL 102 02/07/2014   CO2 26 02/07/2014    No results found for: HGBA1C Lipid Panel  No results found for: CHOL, TRIG, HDL, CHOLHDL, VLDL, LDLCALC     Assessment and plan:   1. Asthma, chronic, unspecified asthma severity, uncomplicated  - albuterol (PROVENTIL HFA;VENTOLIN HFA) 108 (90 BASE) MCG/ACT inhaler; Inhale 2 puffs into the lungs every 2 (two) hours as needed for wheezing or shortness of breath (cough).  Dispense: 1 Inhaler; Refill: 3 - beclomethasone (QVAR) 80 MCG/ACT inhaler; Inhale 2 puffs into the lungs 2 (two) times daily.  Dispense: 1 Inhaler; Refill: 6  2. Depression Currently on Prozac/Remeron following up with [facility]  3. Smoking Consultation to quit smoking. Trial of patch. - nicotine (NICODERM CQ) 21 mg/24hr patch; Place 1 patch (21 mg total) onto the skin daily.  Dispense: 28 patch; Refill: 0  4. Urinary frequency  - Urinalysis  5. Essential hypertension, benign Since patient is already having urinary frequency symptoms will not start on diuretic, patient does not have any plans of getting pregnant, started  patient on low-dose Norvasc.also advise patient for DASH diet, quit smoking. - amLODipine (NORVASC) 2.5 MG tablet; Take 1 tablet (2.5 mg total) by mouth daily.  Dispense: 90 tablet; Refill: 3 - COMPLETE METABOLIC PANEL WITH GFR - Hemoglobin A1c - Vit D  25 hydroxy (rtn osteoporosis monitoring)  6. Needs flu shot Flu shot given today.  7. Need for prophylactic vaccination against Streptococcus pneumoniae (pneumococcus) Pneumovax given today. - Pneumococcal polysaccharide vaccine 23-valent greater than or equal to 2yo subcutaneous/IM      Health Maintenance  -Vaccinations:  Flu shot and Pneumovax given today.  Return in about 3 months (around 10/29/2014) for hypertension, asthma, BP check in 2 weeks/Nurse Visit.   The patient was given clear instructions to go to ER or return to medical center  if symptoms don't improve, worsen or new problems develop. The patient verbalized understanding. The patient was told to call to get lab results if they haven't heard anything in the next week.    This note has been created with Education officer, environmentalDragon speech recognition software and smart phrase technology. Any transcriptional errors are unintentional.   Doris CheadleADVANI, Nikala Walsworth, MD

## 2014-07-29 NOTE — Patient Instructions (Signed)
DASH Eating Plan °DASH stands for "Dietary Approaches to Stop Hypertension." The DASH eating plan is a healthy eating plan that has been shown to reduce high blood pressure (hypertension). Additional health benefits may include reducing the risk of type 2 diabetes mellitus, heart disease, and stroke. The DASH eating plan may also help with weight loss. °WHAT DO I NEED TO KNOW ABOUT THE DASH EATING PLAN? °For the DASH eating plan, you will follow these general guidelines: °· Choose foods with a percent daily value for sodium of less than 5% (as listed on the food label). °· Use salt-free seasonings or herbs instead of table salt or sea salt. °· Check with your health care provider or pharmacist before using salt substitutes. °· Eat lower-sodium products, often labeled as "lower sodium" or "no salt added." °· Eat fresh foods. °· Eat more vegetables, fruits, and low-fat dairy products. °· Choose whole grains. Look for the word "whole" as the first word in the ingredient list. °· Choose fish and skinless chicken or turkey more often than red meat. Limit fish, poultry, and meat to 6 oz (170 g) each day. °· Limit sweets, desserts, sugars, and sugary drinks. °· Choose heart-healthy fats. °· Limit cheese to 1 oz (28 g) per day. °· Eat more home-cooked food and less restaurant, buffet, and fast food. °· Limit fried foods. °· Cook foods using methods other than frying. °· Limit canned vegetables. If you do use them, rinse them well to decrease the sodium. °· When eating at a restaurant, ask that your food be prepared with less salt, or no salt if possible. °WHAT FOODS CAN I EAT? °Seek help from a dietitian for individual calorie needs. °Grains °Whole grain or whole wheat bread. Brown rice. Whole grain or whole wheat pasta. Quinoa, bulgur, and whole grain cereals. Low-sodium cereals. Corn or whole wheat flour tortillas. Whole grain cornbread. Whole grain crackers. Low-sodium crackers. °Vegetables °Fresh or frozen vegetables  (raw, steamed, roasted, or grilled). Low-sodium or reduced-sodium tomato and vegetable juices. Low-sodium or reduced-sodium tomato sauce and paste. Low-sodium or reduced-sodium canned vegetables.  °Fruits °All fresh, canned (in natural juice), or frozen fruits. °Meat and Other Protein Products °Ground beef (85% or leaner), grass-fed beef, or beef trimmed of fat. Skinless chicken or turkey. Ground chicken or turkey. Pork trimmed of fat. All fish and seafood. Eggs. Dried beans, peas, or lentils. Unsalted nuts and seeds. Unsalted canned beans. °Dairy °Low-fat dairy products, such as skim or 1% milk, 2% or reduced-fat cheeses, low-fat ricotta or cottage cheese, or plain low-fat yogurt. Low-sodium or reduced-sodium cheeses. °Fats and Oils °Tub margarines without trans fats. Light or reduced-fat mayonnaise and salad dressings (reduced sodium). Avocado. Safflower, olive, or canola oils. Natural peanut or almond butter. °Other °Unsalted popcorn and pretzels. °The items listed above may not be a complete list of recommended foods or beverages. Contact your dietitian for more options. °WHAT FOODS ARE NOT RECOMMENDED? °Grains °White bread. White pasta. White rice. Refined cornbread. Bagels and croissants. Crackers that contain trans fat. °Vegetables °Creamed or fried vegetables. Vegetables in a cheese sauce. Regular canned vegetables. Regular canned tomato sauce and paste. Regular tomato and vegetable juices. °Fruits °Dried fruits. Canned fruit in light or heavy syrup. Fruit juice. °Meat and Other Protein Products °Fatty cuts of meat. Ribs, chicken wings, bacon, sausage, bologna, salami, chitterlings, fatback, hot dogs, bratwurst, and packaged luncheon meats. Salted nuts and seeds. Canned beans with salt. °Dairy °Whole or 2% milk, cream, half-and-half, and cream cheese. Whole-fat or sweetened yogurt. Full-fat   cheeses or blue cheese. Nondairy creamers and whipped toppings. Processed cheese, cheese spreads, or cheese  curds. °Condiments °Onion and garlic salt, seasoned salt, table salt, and sea salt. Canned and packaged gravies. Worcestershire sauce. Tartar sauce. Barbecue sauce. Teriyaki sauce. Soy sauce, including reduced sodium. Steak sauce. Fish sauce. Oyster sauce. Cocktail sauce. Horseradish. Ketchup and mustard. Meat flavorings and tenderizers. Bouillon cubes. Hot sauce. Tabasco sauce. Marinades. Taco seasonings. Relishes. °Fats and Oils °Butter, stick margarine, lard, shortening, ghee, and bacon fat. Coconut, palm kernel, or palm oils. Regular salad dressings. °Other °Pickles and olives. Salted popcorn and pretzels. °The items listed above may not be a complete list of foods and beverages to avoid. Contact your dietitian for more information. °WHERE CAN I FIND MORE INFORMATION? °National Heart, Lung, and [employer] Institute: www.nhlbi.nih.gov/health/health-topics/topics/dash/ °Document Released: 04/25/2011 Document Revised: 09/20/2013 Document Reviewed: 03/10/2013 °ExitCare® Patient Information ©2015 [employer]. This information is not intended to replace advice given to you by your health care provider. Make sure you discuss any questions you have with your health care provider. ° °

## 2014-07-29 NOTE — Progress Notes (Signed)
Patient here to establish care Patient states she has a history of hypertension and asthma Presents in office with elevated blood pressure

## 2014-07-30 LAB — URINALYSIS
Bilirubin Urine: NEGATIVE
Glucose, UA: NEGATIVE mg/dL
Ketones, ur: NEGATIVE mg/dL
Leukocytes, UA: NEGATIVE
Nitrite: NEGATIVE
PH: 7 (ref 5.0–8.0)
PROTEIN: NEGATIVE mg/dL
SPECIFIC GRAVITY, URINE: 1.006 (ref 1.005–1.030)
Urobilinogen, UA: 0.2 mg/dL (ref 0.0–1.0)

## 2014-07-30 LAB — VITAMIN D 25 HYDROXY (VIT D DEFICIENCY, FRACTURES): Vit D, 25-Hydroxy: 24 ng/mL — ABNORMAL LOW (ref 30–100)

## 2014-08-01 ENCOUNTER — Telehealth: Payer: Self-pay

## 2014-08-01 ENCOUNTER — Other Ambulatory Visit: Payer: Self-pay

## 2014-08-01 ENCOUNTER — Telehealth: Payer: Self-pay | Admitting: Internal Medicine

## 2014-08-01 DIAGNOSIS — I10 Essential (primary) hypertension: Secondary | ICD-10-CM

## 2014-08-01 DIAGNOSIS — N200 Calculus of kidney: Secondary | ICD-10-CM

## 2014-08-01 DIAGNOSIS — F17200 Nicotine dependence, unspecified, uncomplicated: Secondary | ICD-10-CM

## 2014-08-01 DIAGNOSIS — J45909 Unspecified asthma, uncomplicated: Secondary | ICD-10-CM

## 2014-08-01 MED ORDER — VITAMIN D (ERGOCALCIFEROL) 1.25 MG (50000 UNIT) PO CAPS: 50000.0000 [IU] | ORAL_CAPSULE | ORAL | Status: DC

## 2014-08-01 MED ORDER — ALBUTEROL SULFATE HFA 108 (90 BASE) MCG/ACT IN AERS: 2.0000 | INHALATION_SPRAY | RESPIRATORY_TRACT | Status: DC | PRN

## 2014-08-01 MED ORDER — BECLOMETHASONE DIPROPIONATE 80 MCG/ACT IN AERS: 2.0000 | INHALATION_SPRAY | Freq: Two times a day (BID) | RESPIRATORY_TRACT | Status: DC

## 2014-08-01 MED ORDER — AMLODIPINE BESYLATE 2.5 MG PO TABS: 2.5000 mg | ORAL_TABLET | Freq: Every day | ORAL | Status: DC

## 2014-08-01 MED ORDER — NICOTINE 21 MG/24HR TD PT24: 21.0000 mg | MEDICATED_PATCH | Freq: Every day | TRANSDERMAL | Status: DC

## 2014-08-01 NOTE — Telephone Encounter (Signed)
Pt is wondering about her medications regarding Vitamin D and Potassium levels. Pt uses [facility] on [address]. Please follow up with pt.

## 2014-08-01 NOTE — Telephone Encounter (Signed)
-----   Message from Doris Cheadleeepak Advani, MD sent at 08/01/2014 10:19 AM EDT ----- Blood work reviewed, noticed low vitamin D, call patient advise to start ergocalciferol 50,000 units once a week for the duration of  12 weeks.  noticed borderline elevated potassium, advise patient to avoid high potassium containing foods. Also let the patient know that her urine is negative for infection but still shows some blood, since she has history of kidney stones, will recommend evaluation by urology, put in the referral.

## 2014-08-01 NOTE — Telephone Encounter (Signed)
Pt called stating her medication has not been sent to pharmacy. Please f/u

## 2014-08-01 NOTE — Telephone Encounter (Signed)
Patient is aware of her lab results 

## 2014-08-01 NOTE — Telephone Encounter (Signed)
Returned patient phone call Patient states her medications did not get to the pharmacy Resent [facility] to [facility]

## 2014-08-18 ENCOUNTER — Ambulatory Visit: Payer: Medicaid Other | Attending: Internal Medicine | Admitting: *Deleted

## 2014-08-18 VITALS — BP 124/76 | HR 76 | Temp 98.4°F | Resp 20

## 2014-08-18 DIAGNOSIS — Z0130 Encounter for examination of blood pressure without abnormal findings: Secondary | ICD-10-CM

## 2014-08-18 NOTE — Patient Instructions (Signed)
Smoking Cessation, Tips for Success If you are ready to quit smoking, congratulations! You have chosen to help yourself be healthier. Cigarettes bring nicotine, tar, carbon monoxide, and other irritants into your body. Your lungs, heart, and blood vessels will be able to work better without these poisons. There are many different ways to quit smoking. Nicotine gum, nicotine patches, a nicotine inhaler, or nicotine nasal spray can help with physical craving. Hypnosis, support groups, and medicines help break the habit of smoking. WHAT THINGS CAN I DO TO MAKE QUITTING EASIER?  Here are some tips to help you quit for good:  Pick a date when you will quit smoking completely. Tell all of your friends and family about your plan to quit on that date.  Do not try to slowly cut down on the number of cigarettes you are smoking. Pick a quit date and quit smoking completely starting on that day.  Throw away all cigarettes.   Clean and remove all ashtrays from your home, work, and car.  On a card, write down your reasons for quitting. Carry the card with you and read it when you get the urge to smoke.  Cleanse your body of nicotine. Drink enough water and fluids to keep your urine clear or pale yellow. Do this after quitting to flush the nicotine from your body.  Learn to predict your moods. Do not let a bad situation be your excuse to have a cigarette. Some situations in your life might tempt you into wanting a cigarette.  Never have "just one" cigarette. It leads to wanting another and another. Remind yourself of your decision to quit.  Change habits associated with smoking. If you smoked while driving or when feeling stressed, try other activities to replace smoking. Stand up when drinking your coffee. Brush your teeth after eating. Sit in a different chair when you read the paper. Avoid alcohol while trying to quit, and try to drink fewer caffeinated beverages. Alcohol and caffeine may urge you to  smoke.  Avoid foods and drinks that can trigger a desire to smoke, such as sugary or spicy foods and alcohol.  Ask people who smoke not to smoke around you.  Have something planned to do right after eating or having a cup of coffee. For example, plan to take a walk or exercise.  Try a relaxation exercise to calm you down and decrease your stress. Remember, you may be tense and nervous for the first 2 weeks after you quit, but this will pass.  Find new activities to keep your hands busy. Play with a pen, coin, or rubber band. Doodle or draw things on paper.  Brush your teeth right after eating. This will help cut down on the craving for the taste of tobacco after meals. You can also try mouthwash.   Use oral substitutes in place of cigarettes. Try using lemon drops, carrots, cinnamon sticks, or chewing gum. Keep them handy so they are available when you have the urge to smoke.  When you have the urge to smoke, try deep breathing.  Designate your home as a nonsmoking area.  If you are a heavy smoker, ask your health care provider about a prescription for nicotine chewing gum. It can ease your withdrawal from nicotine.  Reward yourself. Set aside the cigarette money you save and buy yourself something nice.  Look for support from others. Join a support group or smoking cessation program. Ask someone at home or at work to help you with your plan   to quit smoking.  Always ask yourself, "Do I need this cigarette or is this just a reflex?" Tell yourself, "Today, I choose not to smoke," or "I do not want to smoke." You are reminding yourself of your decision to quit.  Do not replace cigarette smoking with electronic cigarettes (commonly called e-cigarettes). The safety of e-cigarettes is unknown, and some may contain harmful chemicals.  If you relapse, do not give up! Plan ahead and think about what you will do the next time you get the urge to smoke. HOW WILL I FEEL WHEN I QUIT SMOKING? You  may have symptoms of withdrawal because your body is used to nicotine (the addictive substance in cigarettes). You may crave cigarettes, be irritable, feel very hungry, cough often, get headaches, or have difficulty concentrating. The withdrawal symptoms are only temporary. They are strongest when you first quit but will go away within 10-14 days. When withdrawal symptoms occur, stay in control. Think about your reasons for quitting. Remind yourself that these are signs that your body is healing and getting used to being without cigarettes. Remember that withdrawal symptoms are easier to treat than the major diseases that smoking can cause.  Even after the withdrawal is over, expect periodic urges to smoke. However, these cravings are generally short lived and will go away whether you smoke or not. Do not smoke! WHAT RESOURCES ARE AVAILABLE TO HELP ME QUIT SMOKING? Your health care provider can direct you to community resources or hospitals for support, which may include:  Group support.  Education.  Hypnosis.  Therapy. Document Released: 02/02/2004 Document Revised: 09/20/2013 Document Reviewed: 10/22/2012 ExitCare Patient Information 2015 [employer]. This information is not intended to replace advice given to you by your health care provider. Make sure you discuss any questions you have with your health care provider. Smoking Cessation Quitting smoking is important to your health and has many advantages. However, it is not always easy to quit since nicotine is a very addictive drug. Oftentimes, people try 3 times or more before being able to quit. This document explains the best ways for you to prepare to quit smoking. Quitting takes hard work and a lot of effort, but you can do it. ADVANTAGES OF QUITTING SMOKING  You will live longer, feel better, and live better.  Your body will feel the impact of quitting smoking almost immediately.  Within 20 minutes, blood pressure decreases. Your  pulse returns to its normal level.  After 8 hours, carbon monoxide levels in the blood return to normal. Your oxygen level increases.  After 24 hours, the chance of having a heart attack starts to decrease. Your breath, hair, and body stop smelling like smoke.  After 48 hours, damaged nerve endings begin to recover. Your sense of taste and smell improve.  After 72 hours, the body is virtually free of nicotine. Your bronchial tubes relax and breathing becomes easier.  After 2 to 12 weeks, lungs can hold more air. Exercise becomes easier and circulation improves.  The risk of having a heart attack, stroke, cancer, or lung disease is greatly reduced.  After 1 year, the risk of coronary heart disease is cut in half.  After 5 years, the risk of stroke falls to the same as a nonsmoker.  After 10 years, the risk of lung cancer is cut in half and the risk of other cancers decreases significantly.  After 15 years, the risk of coronary heart disease drops, usually to the level of a nonsmoker.  If   you are pregnant, quitting smoking will improve your chances of having a healthy baby.  The people you live with, especially any children, will be healthier.  You will have extra money to spend on things other than cigarettes. QUESTIONS TO THINK ABOUT BEFORE ATTEMPTING TO QUIT You may want to talk about your answers with your health care provider.  Why do you want to quit?  If you tried to quit in the past, what helped and what did not?  What will be the most difficult situations for you after you quit? How will you plan to handle them?  Who can help you through the tough times? Your family? Friends? A health care provider?  What pleasures do you get from smoking? What ways can you still get pleasure if you quit? Here are some questions to ask your health care provider:  How can you help me to be successful at quitting?  What medicine do you think would be best for me and how should I take  it?  What should I do if I need more help?  What is smoking withdrawal like? How can I get information on withdrawal? GET READY  Set a quit date.  Change your environment by getting rid of all cigarettes, ashtrays, matches, and lighters in your home, car, or work. Do not let people smoke in your home.  Review your past attempts to quit. Think about what worked and what did not. GET SUPPORT AND ENCOURAGEMENT You have a better chance of being successful if you have help. You can get support in many ways.  Tell your family, friends, and coworkers that you are going to quit and need their support. Ask them not to smoke around you.  Get individual, group, or telephone counseling and support. Programs are available at local hospitals and health centers. Call your local health department for information about programs in your area.  Spiritual beliefs and practices may help some smokers quit.  Download a "quit meter" on your computer to keep track of quit statistics, such as how long you have gone without smoking, cigarettes not smoked, and money saved.  Get a self-help book about quitting smoking and staying off tobacco. LEARN NEW SKILLS AND BEHAVIORS  Distract yourself from urges to smoke. Talk to someone, go for a walk, or occupy your time with a task.  Change your normal routine. Take a different route to work. Drink tea instead of coffee. Eat breakfast in a different place.  Reduce your stress. Take a hot bath, exercise, or read a book.  Plan something enjoyable to do every day. Reward yourself for not smoking.  Explore interactive web-based programs that specialize in helping you quit. GET MEDICINE AND USE IT CORRECTLY Medicines can help you stop smoking and decrease the urge to smoke. Combining medicine with the above behavioral methods and support can greatly increase your chances of successfully quitting smoking.  Nicotine replacement therapy helps deliver nicotine to your body  without the negative effects and risks of smoking. Nicotine replacement therapy includes nicotine gum, lozenges, inhalers, nasal sprays, and skin patches. Some may be available over-the-counter and others require a prescription.  Antidepressant medicine helps people abstain from smoking, but how this works is unknown. This medicine is available by prescription.  Nicotinic receptor partial agonist medicine simulates the effect of nicotine in your brain. This medicine is available by prescription. Ask your health care provider for advice about which medicines to use and how to use them based on your health   history. Your health care provider will tell you what side effects to look out for if you choose to be on a medicine or therapy. Carefully read the information on the package. Do not use any other product containing nicotine while using a nicotine replacement product.  RELAPSE OR DIFFICULT SITUATIONS Most relapses occur within the first 3 months after quitting. Do not be discouraged if you start smoking again. Remember, most people try several times before finally quitting. You may have symptoms of withdrawal because your body is used to nicotine. You may crave cigarettes, be irritable, feel very hungry, cough often, get headaches, or have difficulty concentrating. The withdrawal symptoms are only temporary. They are strongest when you first quit, but they will go away within 10-14 days. To reduce the chances of relapse, try to:  Avoid drinking alcohol. Drinking lowers your chances of successfully quitting.  Reduce the amount of caffeine you consume. Once you quit smoking, the amount of caffeine in your body increases and can give you symptoms, such as a rapid heartbeat, sweating, and anxiety.  Avoid smokers because they can make you want to smoke.  Do not let weight gain distract you. Many smokers will gain weight when they quit, usually less than 10 pounds. Eat a healthy diet and stay active. You  can always lose the weight gained after you quit.  Find ways to improve your mood other than smoking. FOR MORE INFORMATION  www.smokefree.gov  Document Released: 04/30/2001 Document Revised: 09/20/2013 Document Reviewed: 08/15/2011 Exit[employer] Patient Information 2015 [employer]. This information is not intended to replace advice given to you by your health care provider. Make sure you discuss any questions you have with your health care provider. DASH Eating Plan DASH stands for "Dietary Approaches to Stop Hypertension." The DASH eating plan is a healthy eating plan that has been shown to reduce high blood pressure (hypertension). Additional health benefits may include reducing the risk of type 2 diabetes mellitus, heart disease, and stroke. The DASH eating plan may also help with weight loss. WHAT DO I NEED TO KNOW ABOUT THE DASH EATING PLAN? For the DASH eating plan, you will follow these general guidelines:  Choose foods with a percent daily value for sodium of less than 5% (as listed on the food label).  Use salt-free seasonings or herbs instead of table salt or sea salt.  Check with your health care provider or pharmacist before using salt substitutes.  Eat lower-sodium products, often labeled as "lower sodium" or "no salt added."  Eat fresh foods.  Eat more vegetables, fruits, and low-fat dairy products.  Choose whole grains. Look for the word "whole" as the first word in the ingredient list.  Choose fish and skinless chicken or turkey more often than red meat. Limit fish, poultry, and meat to 6 oz (170 g) each day.  Limit sweets, desserts, sugars, and sugary drinks.  Choose heart-healthy fats.  Limit cheese to 1 oz (28 g) per day.  Eat more home-cooked food and less restaurant, buffet, and fast food.  Limit fried foods.  Cook foods using methods other than frying.  Limit canned vegetables. If you do use them, rinse them well to decrease the sodium.  When eating at  a restaurant, ask that your food be prepared with less salt, or no salt if possible. WHAT FOODS CAN I EAT? Seek help from a dietitian for individual calorie needs. Grains Whole grain or whole wheat bread. Brown rice. Whole grain or whole wheat pasta. Quinoa, bulgur, and   whole grain cereals. Low-sodium cereals. Corn or whole wheat flour tortillas. Whole grain cornbread. Whole grain crackers. Low-sodium crackers. Vegetables Fresh or frozen vegetables (raw, steamed, roasted, or grilled). Low-sodium or reduced-sodium tomato and vegetable juices. Low-sodium or reduced-sodium tomato sauce and paste. Low-sodium or reduced-sodium canned vegetables.  Fruits All fresh, canned (in natural juice), or frozen fruits. Meat and Other Protein Products Ground beef (85% or leaner), grass-fed beef, or beef trimmed of fat. Skinless chicken or turkey. Ground chicken or turkey. Pork trimmed of fat. All fish and seafood. Eggs. Dried beans, peas, or lentils. Unsalted nuts and seeds. Unsalted canned beans. Dairy Low-fat dairy products, such as skim or 1% milk, 2% or reduced-fat cheeses, low-fat ricotta or cottage cheese, or plain low-fat yogurt. Low-sodium or reduced-sodium cheeses. Fats and Oils Tub margarines without trans fats. Light or reduced-fat mayonnaise and salad dressings (reduced sodium). Avocado. Safflower, olive, or canola oils. Natural peanut or almond butter. Other Unsalted popcorn and pretzels. The items listed above may not be a complete list of recommended foods or beverages. Contact your dietitian for more options. WHAT FOODS ARE NOT RECOMMENDED? Grains White bread. White pasta. White rice. Refined cornbread. Bagels and croissants. Crackers that contain trans fat. Vegetables Creamed or fried vegetables. Vegetables in a cheese sauce. Regular canned vegetables. Regular canned tomato sauce and paste. Regular tomato and vegetable juices. Fruits Dried fruits. Canned fruit in light or heavy syrup. Fruit  juice. Meat and Other Protein Products Fatty cuts of meat. Ribs, chicken wings, bacon, sausage, bologna, salami, chitterlings, fatback, hot dogs, bratwurst, and packaged luncheon meats. Salted nuts and seeds. Canned beans with salt. Dairy Whole or 2% milk, cream, half-and-half, and cream cheese. Whole-fat or sweetened yogurt. Full-fat cheeses or blue cheese. Nondairy creamers and whipped toppings. Processed cheese, cheese spreads, or cheese curds. Condiments Onion and garlic salt, seasoned salt, table salt, and sea salt. Canned and packaged gravies. Worcestershire sauce. Tartar sauce. Barbecue sauce. Teriyaki sauce. Soy sauce, including reduced sodium. Steak sauce. Fish sauce. Oyster sauce. Cocktail sauce. Horseradish. Ketchup and mustard. Meat flavorings and tenderizers. Bouillon cubes. Hot sauce. Tabasco sauce. Marinades. Taco seasonings. Relishes. Fats and Oils Butter, stick margarine, lard, shortening, ghee, and bacon fat. Coconut, palm kernel, or palm oils. Regular salad dressings. Other Pickles and olives. Salted popcorn and pretzels. The items listed above may not be a complete list of foods and beverages to avoid. Contact your dietitian for more information. WHERE CAN I FIND MORE INFORMATION? National Heart, [employer], [employer]: www.nhlbi.nih.gov/health/health-topics/topics/dash/ Document Released: 04/25/2011 Document Revised: 09/20/2013 Document Reviewed: 03/10/2013 ExitCare Patient Information 2015 [employer]. This information is not intended to replace advice given to you by your health care provider. Make sure you discuss any questions you have with your health care provider.  

## 2014-08-18 NOTE — Progress Notes (Signed)
Patient presents for BP check after starting amlodipine 2.5 mg daily Med list reviewed; states taking all meds as directed Discussed need for low sodium diet and using Mrs. Dash as alternative to salt Encouraged to choose foods with 5% or less of daily value for sodium. Discussed walking 30 minutes per day for exercise Patient c/o 1 month hx intermittent blurred vision, SHOB, and chest pain Denies these sx at present C/o throbbing headache at present started yesterday; rates 9/10 States she took ibuprofen 90 minutes ago without relief Smoking .5 ppd; has supply of nicoderm patches but not wanting to quit at present Discussed benefits to smoking cessation in regards to asthma and hypertension  BP 124/76  left arm manually with adult cuff P 76 R  20 T  98.4 oral SPO2  96%  Patient advised to call for med refills at least 7 days before running out so as not to go without. Patient aware that she is to f/u with PCP 3 months from last visit (Due 10/29/14)  Patient given literature on DASH Eating Plan Smoking Cessation

## 2014-09-02 ENCOUNTER — Encounter (HOSPITAL_COMMUNITY): Payer: Self-pay | Admitting: Emergency Medicine

## 2014-09-02 ENCOUNTER — Emergency Department (INDEPENDENT_AMBULATORY_CARE_PROVIDER_SITE_OTHER): Payer: Medicaid Other

## 2014-09-02 ENCOUNTER — Emergency Department (HOSPITAL_COMMUNITY)
Admission: EM | Admit: 2014-09-02 | Discharge: 2014-09-02 | Disposition: A | Discharge status: Home / Self Care | Payer: Medicaid Other | Source: Home / Self Care | Attending: Family Medicine | Admitting: Family Medicine

## 2014-09-02 DIAGNOSIS — S92911A Unspecified fracture of right toe(s), initial encounter for closed fracture: Secondary | ICD-10-CM

## 2014-09-02 MED ORDER — HYDROCODONE-ACETAMINOPHEN 5-325 MG PO TABS: 1.0000 | ORAL_TABLET | Freq: Four times a day (QID) | ORAL | Status: DC | PRN

## 2014-09-02 NOTE — ED Notes (Signed)
Reports she jammed her right greater toe Wednesday night against wooden dresser Sx include swelling, bruising, pain Steady gait; NAD Alert, no signs of acute distress.

## 2014-09-02 NOTE — ED Provider Notes (Signed)
CSN: [id number]     Arrival date & time 09/02/14  0901 History   First MD Initiated Contact with Patient 09/02/14 [phone]     Chief Complaint  Patient presents with  . Toe Injury   (Consider location/radiation/quality/duration/timing/severity/associated sxs/prior Treatment) HPI Comments: Struck right great toe on the corner of her dresser on 08/31/2014 and toe/foot has remained bruised, sore and swollen with weight bearing  Patient is a 34 y.o. female presenting with foot injury. The history is provided by the patient.  Foot Injury Location:  Foot   Past Medical History  Diagnosis Date  . Asthma   . Hypertension   . Anxiety   . Pregnancy induced hypertension   . Kidney stones   . Ovarian cyst   . Vaginal Pap smear, abnormal     bx, f/u ok  . Depression     doing good, not on meds now  . Missed abortion 10/02/2013   Past Surgical History  Procedure Laterality Date  . Cesarean section    . Dilation and curettage of uterus    . Dilation and evacuation N/A 10/04/2013    Procedure: DILATATION AND EVACUATION;  Surgeon: Sherian Rein, MD;  Location: [facility];  Service: Gynecology;  Laterality: N/A;  [place] in room    Family History  Problem Relation Age of Onset  . Hearing loss Mother   . Asthma Mother   . Diabetes Son   . Cancer Maternal Grandfather     bone  . Liver disease Maternal Grandfather   . Asthma Father   . Asthma Sister    History  Substance Use Topics  . Smoking status: Current Some Day Smoker -- 0.50 packs/day for 15 years    Types: Cigarettes  . Smokeless tobacco: Never Used     Comment: Smoking .5 ppd  . Alcohol Use: No     Comment: very occassionally   OB History    Gravida Para Term Preterm AB TAB SAB Ectopic Multiple Living   0 0 0 1     Review of Systems  All other systems reviewed and are negative.   Allergies  Review of patient's allergies indicates no known allergies.  Home Medications   Prior to Admission medications     Medication Sig Start Date End Date Taking? Authorizing Provider  amLODipine (NORVASC) 2.5 MG tablet Take 1 tablet (2.5 mg total) by mouth daily. 08/01/14  Yes Doris Cheadle, MD  beclomethasone (QVAR) 80 MCG/ACT inhaler Inhale 2 puffs into the lungs 2 (two) times daily. 03/20/14  Yes Junious Silk, PA-C  FLUoxetine (PROZAC) 20 MG capsule Take 40 mg by mouth daily.  08/04/14  Yes Historical Provider, MD  solifenacin (VESICARE) 5 MG tablet Take 5 mg by mouth daily. 08/16/14  Yes Historical Provider, MD  Acetaminophen-Caff-Pyrilamine (MIDOL COMPLETE PO) Take 2 tablets by mouth daily as needed (pain/cramping).    Historical Provider, MD  albuterol (PROVENTIL HFA;VENTOLIN HFA) 108 (90 BASE) MCG/ACT inhaler Inhale 2 puffs into the lungs every 2 (two) hours as needed for wheezing or shortness of breath (cough). 08/01/14   Doris Cheadle, MD  azithromycin (ZITHROMAX Z-PAK) 250 MG tablet Take 2 pills today, then 1 pill daily until gone. Patient not taking: Reported on 07/01/2014 06/15/14   Charm Rings, MD  beclomethasone (QVAR) 80 MCG/ACT inhaler Inhale 2 puffs into the lungs 2 (two) times daily. 08/01/14   Doris Cheadle, MD  buPROPion (WELLBUTRIN SR) 100 MG 12 hr tablet Take 100 mg  by mouth 2 (two) times daily.    Historical Provider, MD  HYDROcodone-acetaminophen (NORCO/VICODIN) 5-325 MG per tablet Take 1 tablet by mouth every 6 (six) hours as needed for moderate pain or severe pain. 09/02/14   Mathis FareJennifer Lee H Nnaemeka Samson, PA  hydrOXYzine (ATARAX/VISTARIL) 25 MG tablet Take 1 tablet (25 mg total) by mouth every 4 (four) hours as needed for itching. Patient not taking: Reported on 07/21/2014 11/01/13   Graylon GoodZachary H Baker, PA-C  ibuprofen (ADVIL,MOTRIN) 200 MG tablet Take 800 mg by mouth every 6 (six) hours as needed for moderate pain.    Historical Provider, MD  metroNIDAZOLE (FLAGYL) 500 MG tablet Take 1 tablet (500 mg total) by mouth 2 (two) times daily. Patient not taking: Reported on 07/01/2014 02/07/14   Samuel JesterKathleen McManus,  DO  nicotine (NICODERM CQ) 21 mg/24hr patch Place 1 patch (21 mg total) onto the skin daily. Patient not taking: Reported on 08/18/2014 08/01/14   Doris Cheadleeepak Advani, MD  predniSONE (DELTASONE) 20 MG tablet Take 2 tablets (40 mg total) by mouth daily. Patient not taking: Reported on 07/01/2014 06/15/14   Charm RingsErin J Honig, MD  Vitamin D, Ergocalciferol, (DRISDOL) 50000 UNITS CAPS capsule Take 1 capsule (50,000 Units total) by mouth every 7 (seven) days. 08/01/14   Doris Cheadleeepak Advani, MD   LMP 08/19/2014 (Approximate) Physical Exam  Constitutional: She is oriented to person, place, and time. She appears well-developed and well-nourished. No distress.  HENT:  Head: Normocephalic and atraumatic.  Cardiovascular: Normal rate.   Pulmonary/Chest: Effort normal.  Musculoskeletal:       Right foot: There is normal capillary refill.       Feet:  Neurological: She is alert and oriented to person, place, and time.  Skin: Skin is warm and dry.  +intact   Psychiatric: She has a normal mood and affect. Her behavior is normal.  Nursing note and vitals reviewed.   ED Course  Procedures (including critical care time) Labs Review Labs Reviewed - No data to display  Imaging Review Dg Toe Great Right  09/02/2014   CLINICAL DATA:  Slammed great toe in drawer 2 days ago. Great toe pain and bruising. Initial encounter.  EXAM: RIGHT GREAT TOE  COMPARISON:  None.  FINDINGS: Acute, nondisplaced fracture is seen along the medial aspect of the proximal phalanx of the great toe, with intra-articular extension into the interphalangeal joint. No evidence of dislocation. Degenerative spurring is seen involving the first MTP joint.  IMPRESSION: Nondisplaced fracture at the medial margin of the proximal phalanx, with intra-articular extension into the interphalangeal joint.   Electronically Signed   By: Myles RosenthalJohn  Stahl M.D.   On: 09/02/2014 10:30     MDM   1. Toe fracture, right, closed, initial encounter   Buddy tape, Ace wrap and  post op shoe with ortho follow up with Dr. Magnus IvanBlackman. RICE therapy and Norco at home.     Ria ClockJennifer Lee H Bette Brienza, [place] 09/02/14 1155

## 2014-09-02 NOTE — Discharge Instructions (Signed)
Buddy Taping of Toes °We have taped your toes together to keep them from moving. This is called "buddy taping" since we used a part of your own body to keep the injured part still. We placed soft padding between your toes to keep them from rubbing against each other. Buddy taping will help with healing and to reduce pain. Keep your toes buddy taped together for as long as directed by your caregiver. °HOME CARE INSTRUCTIONS  °· Raise your injured area above the level of your heart while sitting or lying down. Prop it up with pillows. °· An ice pack used every twenty minutes, while awake, for the first one to two days may be helpful. Put ice in a plastic bag and put a towel between the bag and your skin. °· Watch for signs that the taping is too tight. These signs may be: °· Numbness of your taped toes. °· Coolness of your taped toes. °· Color change in the area beyond the tape. °· Increased pain. °· If you have any of these signs, loosen or rewrap the tape. If you need to loosen or rewrap the buddy tape, make sure you use the padding again. °SEEK IMMEDIATE MEDICAL CARE IF:  °· You have worse pain, swelling, inflammation (soreness), drainage or bleeding after you rewrap the tape. °· Any new problems occur. °MAKE SURE YOU:  °· Understand these instructions. °· Will watch your condition. °· Will get help right away if you are not doing well or get worse. °Document Released: 02/08/2004 Document Revised: 07/29/2011 Document Reviewed: 05/03/2008 °ExitCare® Patient Information ©2015 [employer]. This information is not intended to replace advice given to you by your health care provider. Make sure you discuss any questions you have with your health care provider. ° °Toe Fracture °Your caregiver has diagnosed you as having a fractured toe. A toe fracture is a break in the bone of a toe. "Buddy taping" is a way of splinting your broken toe, by taping the broken toe to the toe next to it. This "buddy taping" will keep the  injured toe from moving beyond normal range of motion. Buddy taping also helps the toe heal in a more normal alignment. It may take 6 to 8 weeks for the toe injury to heal. °HOME CARE INSTRUCTIONS  °· Leave your toes taped together for as long as directed by your caregiver or until you see a doctor for a follow-up examination. You can change the tape after bathing. Always use a small piece of gauze or cotton between the toes when taping them together. This will help the skin stay dry and prevent infection. °· Apply ice to the injury for 15-20 minutes each hour while awake for the first 2 days. Put the ice in a plastic bag and place a towel between the bag of ice and your skin. °· After the first 2 days, apply heat to the injured area. Use heat for the next 2 to 3 days. Place a heating pad on the foot or soak the foot in warm water as directed by your caregiver. °· Keep your foot elevated as much as possible to lessen swelling. °· Wear sturdy, supportive shoes. The shoes should not pinch the toes or fit tightly against the toes. °· Your caregiver may prescribe a rigid shoe if your foot is very swollen. °· Your may be given crutches if the pain is too great and it hurts too much to walk. °· Only take over-the-counter or prescription medicines for pain, discomfort,   or fever as directed by your caregiver. °· If your caregiver has given you a follow-up appointment, it is very important to keep that appointment. Not keeping the appointment could result in a chronic or permanent injury, pain, and disability. If there is any problem keeping the appointment, you must call back to this facility for assistance. °SEEK MEDICAL CARE IF:  °· You have increased pain or swelling, not relieved with medications. °· The pain does not get better after 1 week. °· Your injured toe is cold when the others are warm. °SEEK IMMEDIATE MEDICAL CARE IF:  °· The toe becomes cold, numb, or white. °· The toe becomes hot (inflamed) and  red. °Document Released: 05/03/2000 Document Revised: 07/29/2011 Document Reviewed: 12/21/2007 °ExitCare® Patient Information ©2015 [employer]. This information is not intended to replace advice given to you by your health care provider. Make sure you discuss any questions you have with your health care provider. ° °

## 2014-09-09 ENCOUNTER — Telehealth: Payer: Self-pay

## 2014-10-06 ENCOUNTER — Ambulatory Visit: Payer: Medicaid Other | Attending: Internal Medicine | Admitting: Internal Medicine

## 2014-10-06 ENCOUNTER — Encounter: Payer: Self-pay | Admitting: Internal Medicine

## 2014-10-06 VITALS — BP 148/93 | HR 80 | Temp 98.0°F | Resp 16 | Wt 170.0 lb

## 2014-10-06 DIAGNOSIS — F419 Anxiety disorder, unspecified: Secondary | ICD-10-CM | POA: Insufficient documentation

## 2014-10-06 DIAGNOSIS — S7012XA Contusion of left thigh, initial encounter: Secondary | ICD-10-CM | POA: Diagnosis not present

## 2014-10-06 DIAGNOSIS — F17210 Nicotine dependence, cigarettes, uncomplicated: Secondary | ICD-10-CM | POA: Insufficient documentation

## 2014-10-06 DIAGNOSIS — Z7952 Long term (current) use of systemic steroids: Secondary | ICD-10-CM | POA: Diagnosis not present

## 2014-10-06 DIAGNOSIS — T148XXA Other injury of unspecified body region, initial encounter: Secondary | ICD-10-CM

## 2014-10-06 DIAGNOSIS — F17200 Nicotine dependence, unspecified, uncomplicated: Secondary | ICD-10-CM

## 2014-10-06 DIAGNOSIS — I10 Essential (primary) hypertension: Secondary | ICD-10-CM | POA: Diagnosis not present

## 2014-10-06 DIAGNOSIS — J45909 Unspecified asthma, uncomplicated: Secondary | ICD-10-CM | POA: Insufficient documentation

## 2014-10-06 DIAGNOSIS — Z792 Long term (current) use of antibiotics: Secondary | ICD-10-CM | POA: Diagnosis not present

## 2014-10-06 DIAGNOSIS — R5383 Other fatigue: Secondary | ICD-10-CM

## 2014-10-06 DIAGNOSIS — IMO0001 Reserved for inherently not codable concepts without codable children: Secondary | ICD-10-CM

## 2014-10-06 DIAGNOSIS — T148 Other injury of unspecified body region: Secondary | ICD-10-CM

## 2014-10-06 DIAGNOSIS — R42 Dizziness and giddiness: Secondary | ICD-10-CM | POA: Diagnosis not present

## 2014-10-06 DIAGNOSIS — Z7951 Long term (current) use of inhaled steroids: Secondary | ICD-10-CM | POA: Diagnosis not present

## 2014-10-06 DIAGNOSIS — Z720 Tobacco use: Secondary | ICD-10-CM

## 2014-10-06 MED ORDER — MECLIZINE HCL 25 MG PO TABS: 25.0000 mg | ORAL_TABLET | Freq: Three times a day (TID) | ORAL | Status: DC | PRN

## 2014-10-06 NOTE — Patient Instructions (Signed)
Smoking Cessation Quitting smoking is important to your health and has many advantages. However, it is not always easy to quit since nicotine is a very addictive drug. Oftentimes, people try 3 times or more before being able to quit. This document explains the best ways for you to prepare to quit smoking. Quitting takes hard work and a lot of effort, but you can do it. ADVANTAGES OF QUITTING SMOKING  You will live longer, feel better, and live better.  Your body will feel the impact of quitting smoking almost immediately.  Within 20 minutes, blood pressure decreases. Your pulse returns to its normal level.  After 8 hours, carbon monoxide levels in the blood return to normal. Your oxygen level increases.  After 24 hours, the chance of having a heart attack starts to decrease. Your breath, hair, and body stop smelling like smoke.  After 48 hours, damaged nerve endings begin to recover. Your sense of taste and smell improve.  After 72 hours, the body is virtually free of nicotine. Your bronchial tubes relax and breathing becomes easier.  After 2 to 12 weeks, lungs can hold more air. Exercise becomes easier and circulation improves.  The risk of having a heart attack, stroke, cancer, or lung disease is greatly reduced.  After 1 year, the risk of coronary heart disease is cut in half.  After 5 years, the risk of stroke falls to the same as a nonsmoker.  After 10 years, the risk of lung cancer is cut in half and the risk of other cancers decreases significantly.  After 15 years, the risk of coronary heart disease drops, usually to the level of a nonsmoker.  If you are pregnant, quitting smoking will improve your chances of having a healthy baby.  The people you live with, especially any children, will be healthier.  You will have extra money to spend on things other than cigarettes. QUESTIONS TO THINK ABOUT BEFORE ATTEMPTING TO QUIT You may want to talk about your answers with your  health care provider.  Why do you want to quit?  If you tried to quit in the past, what helped and what did not?  What will be the most difficult situations for you after you quit? How will you plan to handle them?  Who can help you through the tough times? Your family? Friends? A health care provider?  What pleasures do you get from smoking? What ways can you still get pleasure if you quit? Here are some questions to ask your health care provider:  How can you help me to be successful at quitting?  What medicine do you think would be best for me and how should I take it?  What should I do if I need more help?  What is smoking withdrawal like? How can I get information on withdrawal? GET READY  Set a quit date.  Change your environment by getting rid of all cigarettes, ashtrays, matches, and lighters in your home, car, or work. Do not let people smoke in your home.  Review your past attempts to quit. Think about what worked and what did not. GET SUPPORT AND ENCOURAGEMENT You have a better chance of being successful if you have help. You can get support in many ways.  Tell your family, friends, and coworkers that you are going to quit and need their support. Ask them not to smoke around you.  Get individual, group, or telephone counseling and support. Programs are available at local hospitals and health centers. Call   your local health department for information about programs in your area.  Spiritual beliefs and practices may help some smokers quit.  Download a "quit meter" on your computer to keep track of quit statistics, such as how long you have gone without smoking, cigarettes not smoked, and money saved.  Get a self-help book about quitting smoking and staying off tobacco. LEARN NEW SKILLS AND BEHAVIORS  Distract yourself from urges to smoke. Talk to someone, go for a walk, or occupy your time with a task.  Change your normal routine. Take a different route to work.  Drink tea instead of coffee. Eat breakfast in a different place.  Reduce your stress. Take a hot bath, exercise, or read a book.  Plan something enjoyable to do every day. Reward yourself for not smoking.  Explore interactive web-based programs that specialize in helping you quit. GET MEDICINE AND USE IT CORRECTLY Medicines can help you stop smoking and decrease the urge to smoke. Combining medicine with the above behavioral methods and support can greatly increase your chances of successfully quitting smoking.  Nicotine replacement therapy helps deliver nicotine to your body without the negative effects and risks of smoking. Nicotine replacement therapy includes nicotine gum, lozenges, inhalers, nasal sprays, and skin patches. Some may be available over-the-counter and others require a prescription.  Antidepressant medicine helps people abstain from smoking, but how this works is unknown. This medicine is available by prescription.  Nicotinic receptor partial agonist medicine simulates the effect of nicotine in your brain. This medicine is available by prescription. Ask your health care provider for advice about which medicines to use and how to use them based on your health history. Your health care provider will tell you what side effects to look out for if you choose to be on a medicine or therapy. Carefully read the information on the package. Do not use any other product containing nicotine while using a nicotine replacement product.  RELAPSE OR DIFFICULT SITUATIONS Most relapses occur within the first 3 months after quitting. Do not be discouraged if you start smoking again. Remember, most people try several times before finally quitting. You may have symptoms of withdrawal because your body is used to nicotine. You may crave cigarettes, be irritable, feel very hungry, cough often, get headaches, or have difficulty concentrating. The withdrawal symptoms are only temporary. They are strongest  when you first quit, but they will go away within 10-14 days. To reduce the chances of relapse, try to:  Avoid drinking alcohol. Drinking lowers your chances of successfully quitting.  Reduce the amount of caffeine you consume. Once you quit smoking, the amount of caffeine in your body increases and can give you symptoms, such as a rapid heartbeat, sweating, and anxiety.  Avoid smokers because they can make you want to smoke.  Do not let weight gain distract you. Many smokers will gain weight when they quit, usually less than 10 pounds. Eat a healthy diet and stay active. You can always lose the weight gained after you quit.  Find ways to improve your mood other than smoking. FOR MORE INFORMATION  www.smokefree.gov  Document Released: 04/30/2001 Document Revised: 09/20/2013 Document Reviewed: 08/15/2011 ExitCare Patient Information 2015 [employer]. This information is not intended to replace advice given to you by your health care provider. Make sure you discuss any questions you have with your health care provider. DASH Eating Plan DASH stands for "Dietary Approaches to Stop Hypertension." The DASH eating plan is a healthy eating plan that has   been shown to reduce high blood pressure (hypertension). Additional health benefits may include reducing the risk of type 2 diabetes mellitus, heart disease, and stroke. The DASH eating plan may also help with weight loss. WHAT DO I NEED TO KNOW ABOUT THE DASH EATING PLAN? For the DASH eating plan, you will follow these general guidelines:  Choose foods with a percent daily value for sodium of less than 5% (as listed on the food label).  Use salt-free seasonings or herbs instead of table salt or sea salt.  Check with your health care provider or pharmacist before using salt substitutes.  Eat lower-sodium products, often labeled as "lower sodium" or "no salt added."  Eat fresh foods.  Eat more vegetables, fruits, and low-fat dairy  products.  Choose whole grains. Look for the word "whole" as the first word in the ingredient list.  Choose fish and skinless chicken or turkey more often than red meat. Limit fish, poultry, and meat to 6 oz (170 g) each day.  Limit sweets, desserts, sugars, and sugary drinks.  Choose heart-healthy fats.  Limit cheese to 1 oz (28 g) per day.  Eat more home-cooked food and less restaurant, buffet, and fast food.  Limit fried foods.  Cook foods using methods other than frying.  Limit canned vegetables. If you do use them, rinse them well to decrease the sodium.  When eating at a restaurant, ask that your food be prepared with less salt, or no salt if possible. WHAT FOODS CAN I EAT? Seek help from a dietitian for individual calorie needs. Grains Whole grain or whole wheat bread. Brown rice. Whole grain or whole wheat pasta. Quinoa, bulgur, and whole grain cereals. Low-sodium cereals. Corn or whole wheat flour tortillas. Whole grain cornbread. Whole grain crackers. Low-sodium crackers. Vegetables Fresh or frozen vegetables (raw, steamed, roasted, or grilled). Low-sodium or reduced-sodium tomato and vegetable juices. Low-sodium or reduced-sodium tomato sauce and paste. Low-sodium or reduced-sodium canned vegetables.  Fruits All fresh, canned (in natural juice), or frozen fruits. Meat and Other Protein Products Ground beef (85% or leaner), grass-fed beef, or beef trimmed of fat. Skinless chicken or turkey. Ground chicken or turkey. Pork trimmed of fat. All fish and seafood. Eggs. Dried beans, peas, or lentils. Unsalted nuts and seeds. Unsalted canned beans. Dairy Low-fat dairy products, such as skim or 1% milk, 2% or reduced-fat cheeses, low-fat ricotta or cottage cheese, or plain low-fat yogurt. Low-sodium or reduced-sodium cheeses. Fats and Oils Tub margarines without trans fats. Light or reduced-fat mayonnaise and salad dressings (reduced sodium). Avocado. Safflower, olive, or canola  oils. Natural peanut or almond butter. Other Unsalted popcorn and pretzels. The items listed above may not be a complete list of recommended foods or beverages. Contact your dietitian for more options. WHAT FOODS ARE NOT RECOMMENDED? Grains White bread. White pasta. White rice. Refined cornbread. Bagels and croissants. Crackers that contain trans fat. Vegetables Creamed or fried vegetables. Vegetables in a cheese sauce. Regular canned vegetables. Regular canned tomato sauce and paste. Regular tomato and vegetable juices. Fruits Dried fruits. Canned fruit in light or heavy syrup. Fruit juice. Meat and Other Protein Products Fatty cuts of meat. Ribs, chicken wings, bacon, sausage, bologna, salami, chitterlings, fatback, hot dogs, bratwurst, and packaged luncheon meats. Salted nuts and seeds. Canned beans with salt. Dairy Whole or 2% milk, cream, half-and-half, and cream cheese. Whole-fat or sweetened yogurt. Full-fat cheeses or blue cheese. Nondairy creamers and whipped toppings. Processed cheese, cheese spreads, or cheese curds. Condiments Onion and garlic salt,   seasoned salt, table salt, and sea salt. Canned and packaged gravies. Worcestershire sauce. Tartar sauce. Barbecue sauce. Teriyaki sauce. Soy sauce, including reduced sodium. Steak sauce. Fish sauce. Oyster sauce. Cocktail sauce. Horseradish. Ketchup and mustard. Meat flavorings and tenderizers. Bouillon cubes. Hot sauce. Tabasco sauce. Marinades. Taco seasonings. Relishes. Fats and Oils Butter, stick margarine, lard, shortening, ghee, and bacon fat. Coconut, palm kernel, or palm oils. Regular salad dressings. Other Pickles and olives. Salted popcorn and pretzels. The items listed above may not be a complete list of foods and beverages to avoid. Contact your dietitian for more information. WHERE CAN I FIND MORE INFORMATION? National Heart, Lung, and [employer] Institute: www.nhlbi.nih.gov/health/health-topics/topics/dash/ Document Released:  04/25/2011 Document Revised: 09/20/2013 Document Reviewed: 03/10/2013 ExitCare Patient Information 2015 [employer]. This information is not intended to replace advice given to you by your health care provider. Make sure you discuss any questions you have with your health care provider.  

## 2014-10-06 NOTE — Progress Notes (Signed)
MRN: [id number] Name: [name]  Sex: female Age: 34 y.o. DOB: [birth year]  Allergies: Review of patient's allergies indicates no known allergies.  Chief Complaint  Patient presents with  . bruise    HPI: Patient is 34 y.o. female who has history of hypertension comes today complaining of noticing a bruise on her left thigh ? She noticed after she was itching there as per patient she has noticed it in the past which resolved on its own denies any trauma, denies any family history of blood disease, she does report heavy menstrual bleeding, denies any fever chills, her blood pressure is borderline elevated, as per patient she is taking her blood pressure medication, still smokes cigarettes, consultation quit smoking she does complain of occasional dizziness like room is spinning sensation and sometimes ear fullness denies any chest pain or palpitation.patient also history of depression and is following up with [facility] denies any SI or HI.patient is complaining of feeling tired.  Past Medical History  Diagnosis Date  . Asthma   . Hypertension   . Anxiety   . Pregnancy induced hypertension   . Kidney stones   . Ovarian cyst   . Vaginal Pap smear, abnormal     bx, f/u ok  . Depression     doing good, not on meds now  . Missed abortion 10/02/2013    Past Surgical History  Procedure Laterality Date  . Cesarean section    . Dilation and curettage of uterus    . Dilation and evacuation N/A 10/04/2013    Procedure: DILATATION AND EVACUATION;  Surgeon: Sherian ReinJody Bovard-Stuckert, MD;  Location: [facility];  Service: Gynecology;  Laterality: N/A;  US in room       Medication List       This list is accurate as of: 10/06/14 12:59 PM.  Always use your most recent med list.               albuterol 108 (90 BASE) MCG/ACT inhaler  Commonly known as:  PROVENTIL HFA;VENTOLIN HFA  Inhale 2 puffs into the lungs every 2 (two) hours as needed for wheezing or shortness of breath (cough).       amLODipine 2.5 MG tablet  Commonly known as:  NORVASC  Take 1 tablet (2.5 mg total) by mouth daily.     azithromycin 250 MG tablet  Commonly known as:  ZITHROMAX Z-PAK  Take 2 pills today, then 1 pill daily until gone.     beclomethasone 80 MCG/ACT inhaler  Commonly known as:  QVAR  Inhale 2 puffs into the lungs 2 (two) times daily.     beclomethasone 80 MCG/ACT inhaler  Commonly known as:  QVAR  Inhale 2 puffs into the lungs 2 (two) times daily.     buPROPion 100 MG 12 hr tablet  Commonly known as:  WELLBUTRIN SR  Take 100 mg by mouth 2 (two) times daily.     FLUoxetine 20 MG capsule  Commonly known as:  PROZAC  Take 40 mg by mouth daily.     HYDROcodone-acetaminophen 5-325 MG per tablet  Commonly known as:  NORCO/VICODIN  Take 1 tablet by mouth every 6 (six) hours as needed for moderate pain or severe pain.     hydrOXYzine 25 MG tablet  Commonly known as:  ATARAX/VISTARIL  Take 1 tablet (25 mg total) by mouth every 4 (four) hours as needed for itching.     ibuprofen 200 MG tablet  Commonly known as:  ADVIL,MOTRIN  Take  800 mg by mouth every 6 (six) hours as needed for moderate pain.     meclizine 25 MG tablet  Commonly known as:  ANTIVERT  Take 1 tablet (25 mg total) by mouth 3 (three) times daily as needed for dizziness.     metroNIDAZOLE 500 MG tablet  Commonly known as:  FLAGYL  Take 1 tablet (500 mg total) by mouth 2 (two) times daily.     MIDOL COMPLETE PO  Take 2 tablets by mouth daily as needed (pain/cramping).     nicotine 21 mg/24hr patch  Commonly known as:  NICODERM CQ  Place 1 patch (21 mg total) onto the skin daily.     predniSONE 20 MG tablet  Commonly known as:  DELTASONE  Take 2 tablets (40 mg total) by mouth daily.     solifenacin 5 MG tablet  Commonly known as:  VESICARE  Take 5 mg by mouth daily.     Vitamin D (Ergocalciferol) 50000 UNITS Caps capsule  Commonly known as:  DRISDOL  Take 1 capsule (50,000 Units total) by mouth every 7  (seven) days.        Meds ordered this encounter  Medications  . meclizine (ANTIVERT) 25 MG tablet    Sig: Take 1 tablet (25 mg total) by mouth 3 (three) times daily as needed for dizziness.    Dispense:  30 tablet    Refill:  1    Immunization History  Administered Date(s) Administered  . Influenza,inj,Quad PF,36+ Mos 07/29/2014  . Pneumococcal Polysaccharide-23 07/29/2014    Family History  Problem Relation Age of Onset  . Hearing loss Mother   . Asthma Mother   . Diabetes Son   . Cancer Maternal Grandfather     bone  . Liver disease Maternal Grandfather   . Asthma Father   . Asthma Sister     History  Substance Use Topics  . Smoking status: Current Some Day Smoker -- 0.50 packs/day for 15 years    Types: Cigarettes  . Smokeless tobacco: Never Used     Comment: Smoking .5 ppd  . Alcohol Use: No     Comment: very occassionally    Review of Systems   As noted in HPI  Filed Vitals:   10/06/14 1150  BP: 148/93  Pulse: 80  Temp: 98 F (36.7 C)  Resp: 16    Physical Exam  Physical Exam  Constitutional: No distress.  Eyes: EOM are normal. Pupils are equal, round, and reactive to light.  Cardiovascular: Normal rate and regular rhythm.   Pulmonary/Chest: Breath sounds normal. No respiratory distress. She has no wheezes. She has no rales.  Skin:  Patient examined in the presence of medical staff as to prompt, noticed bruise on her left thigh, nontender no skin breakdown or discharge    CBC    Component Value Date/Time   WBC 6.4 02/07/2014 1449   RBC 4.53 02/07/2014 1449   HGB 14.2 02/07/2014 1449   HCT 42.7 02/07/2014 1449   PLT 307 02/07/2014 1449   MCV 94.3 02/07/2014 1449   LYMPHSABS 1.9 02/07/2014 1449   MONOABS 0.7 02/07/2014 1449   EOSABS 0.1 02/07/2014 1449   BASOSABS 0.1 02/07/2014 1449    CMP     Component Value Date/Time   NA 138 07/29/2014 1112   K 5.4* 07/29/2014 1112   CL 103 07/29/2014 1112   CO2 25 07/29/2014 1112    GLUCOSE 86 07/29/2014 1112   BUN 12 07/29/2014 1112   CREATININE  0.96 07/29/2014 1112   CREATININE 1.11* 02/07/2014 1449   CALCIUM 9.7 07/29/2014 1112   PROT 6.6 07/29/2014 1112   ALBUMIN 4.3 07/29/2014 1112   AST 27 07/29/2014 1112   ALT 17 07/29/2014 1112   ALKPHOS 62 07/29/2014 1112   BILITOT 0.5 07/29/2014 1112   GFRNONAA 79 07/29/2014 1112   GFRNONAA 65* 02/07/2014 1449   GFRAA >89 07/29/2014 1112   GFRAA 76* 02/07/2014 1449    No results found for: CHOL  Lab Results  Component Value Date/Time   HGBA1C 5.2 07/29/2014 11:12 AM    Lab Results  Component Value Date/Time   AST 27 07/29/2014 11:12 AM    Assessment and Plan  Essential hypertension, benign - Plan:blood pressure is borderline elevated, advise patient for DASH diet continue with amlodipine, repeat blood chemistry  COMPLETE METABOLIC PANEL WITH GFR  Smoking Again counseled patient to quit smoking.   Other fatigue - Plan: CBC with Differential/Platelet  Bruise - Plan: CBC with Differential/Platelet, COMPLETE METABOLIC PANEL WITH GFR, Protime-INR, APTT  Dizziness and giddiness - Plan:Trial of meclizine (ANTIVERT) 25 MG tablet   Return in about 3 months (around 01/06/2015), or if symptoms worsen or fail to improve.   This note has been created with Education officer, environmentalDragon speech recognition software and smart phrase technology. Any transcriptional errors are unintentional.    Doris CheadleADVANI, Ricco Dershem, MD

## 2014-10-06 NOTE — Progress Notes (Signed)
Patient states she is concerned about a bruise to her left thigh Area started off as a scratch and now it has turned into a large black and blue Area.  Patient also states she is  Concerned with being anemic-complains of  Feeling tired, has been having headaches and some bouts of dizziness Patient also complains of a cough she has had for three weeks and not getting any relief With OTC medications

## 2014-10-07 ENCOUNTER — Telehealth: Payer: Self-pay

## 2014-10-07 LAB — CBC WITH DIFFERENTIAL/PLATELET
Basophils Absolute: 0.1 10*3/uL (ref 0.0–0.1)
Basophils Relative: 1 % (ref 0–1)
Eosinophils Absolute: 0.1 10*3/uL (ref 0.0–0.7)
Eosinophils Relative: 1 % (ref 0–5)
HCT: 45.1 % (ref 36.0–46.0)
Hemoglobin: 15.1 g/dL — ABNORMAL HIGH (ref 12.0–15.0)
LYMPHS ABS: 1.7 10*3/uL (ref 0.7–4.0)
Lymphocytes Relative: 19 % (ref 12–46)
MCH: 31.3 pg (ref 26.0–34.0)
MCHC: 33.5 g/dL (ref 30.0–36.0)
MCV: 93.4 fL (ref 78.0–100.0)
MONOS PCT: 8 % (ref 3–12)
MPV: 9.7 fL (ref 8.6–12.4)
Monocytes Absolute: 0.7 10*3/uL (ref 0.1–1.0)
NEUTROS ABS: 6.2 10*3/uL (ref 1.7–7.7)
NEUTROS PCT: 71 % (ref 43–77)
PLATELETS: 320 10*3/uL (ref 150–400)
RBC: 4.83 MIL/uL (ref 3.87–5.11)
RDW: 13.2 % (ref 11.5–15.5)
WBC: 8.7 10*3/uL (ref 4.0–10.5)

## 2014-10-07 LAB — COMPLETE METABOLIC PANEL WITH GFR
ALBUMIN: 4.3 g/dL (ref 3.5–5.2)
ALK PHOS: 60 U/L (ref 39–117)
ALT: 19 U/L (ref 0–35)
AST: 23 U/L (ref 0–37)
BUN: 15 mg/dL (ref 6–23)
CALCIUM: 9.9 mg/dL (ref 8.4–10.5)
CO2: 24 mEq/L (ref 19–32)
Chloride: 104 mEq/L (ref 96–112)
Creat: 0.93 mg/dL (ref 0.50–1.10)
GFR, EST NON AFRICAN AMERICAN: 82 mL/min
GLUCOSE: 91 mg/dL (ref 70–99)
POTASSIUM: 4.7 meq/L (ref 3.5–5.3)
SODIUM: 136 meq/L (ref 135–145)
TOTAL PROTEIN: 6.9 g/dL (ref 6.0–8.3)
Total Bilirubin: 0.4 mg/dL (ref 0.2–1.2)

## 2014-10-07 LAB — PROTIME-INR
INR: 0.83 (ref <1.50)
PROTHROMBIN TIME: 11.4 s — AB (ref 11.6–15.2)

## 2014-10-07 LAB — APTT: aPTT: 28 seconds (ref 24–37)

## 2014-10-07 NOTE — Telephone Encounter (Signed)
Patient is aware of her lab results 

## 2014-10-07 NOTE — Telephone Encounter (Signed)
-----   Message from Doris Cheadleeepak Advani, MD sent at 10/07/2014 10:22 AM EDT ----- Call and let the patient know that her blood work is essentially normal

## 2014-10-18 ENCOUNTER — Other Ambulatory Visit: Payer: Self-pay | Admitting: Internal Medicine

## 2014-10-25 ENCOUNTER — Emergency Department (HOSPITAL_COMMUNITY)
Admission: EM | Admit: 2014-10-25 | Discharge: 2014-10-25 | Disposition: A | Discharge status: Other Acute Inpatient Hospital | Payer: Medicaid Other

## 2014-10-25 ENCOUNTER — Telehealth: Payer: Self-pay | Admitting: Internal Medicine

## 2014-10-25 NOTE — Telephone Encounter (Signed)
It is important to add her gestational period and estimated due date.

## 2014-10-25 NOTE — Telephone Encounter (Signed)
Pt is concerned about her medication intake and how this might effect her pregnancy. Please follow up with medication to advise on which medication she would need to stop taking. Thank you.

## 2014-10-25 NOTE — Telephone Encounter (Signed)
Pt is needing a letter stating that you are pregnant for medicaid purposes in order to be seen by an obgyn. Patient states that she can come pick up the letter. Please follow up with pt. Thank you.

## 2014-10-27 ENCOUNTER — Inpatient Hospital Stay (HOSPITAL_COMMUNITY): Payer: Medicaid Other

## 2014-10-27 ENCOUNTER — Encounter (HOSPITAL_COMMUNITY): Payer: Self-pay | Admitting: Medical

## 2014-10-27 ENCOUNTER — Inpatient Hospital Stay (HOSPITAL_COMMUNITY)
Admission: AD | Admit: 2014-10-27 | Discharge: 2014-10-27 | Disposition: A | Discharge status: Home / Self Care | Payer: Medicaid Other | Source: Ambulatory Visit | Attending: Obstetrics & Gynecology | Admitting: Obstetrics & Gynecology

## 2014-10-27 DIAGNOSIS — O209 Hemorrhage in early pregnancy, unspecified: Secondary | ICD-10-CM | POA: Insufficient documentation

## 2014-10-27 DIAGNOSIS — O161 Unspecified maternal hypertension, first trimester: Secondary | ICD-10-CM | POA: Diagnosis not present

## 2014-10-27 DIAGNOSIS — F329 Major depressive disorder, single episode, unspecified: Secondary | ICD-10-CM | POA: Insufficient documentation

## 2014-10-27 DIAGNOSIS — O43891 Other placental disorders, first trimester: Secondary | ICD-10-CM

## 2014-10-27 DIAGNOSIS — F17210 Nicotine dependence, cigarettes, uncomplicated: Secondary | ICD-10-CM | POA: Diagnosis not present

## 2014-10-27 DIAGNOSIS — O99341 Other mental disorders complicating pregnancy, first trimester: Secondary | ICD-10-CM | POA: Diagnosis not present

## 2014-10-27 DIAGNOSIS — O468X1 Other antepartum hemorrhage, first trimester: Secondary | ICD-10-CM

## 2014-10-27 DIAGNOSIS — O99331 Smoking (tobacco) complicating pregnancy, first trimester: Secondary | ICD-10-CM | POA: Insufficient documentation

## 2014-10-27 DIAGNOSIS — O418X10 Other specified disorders of amniotic fluid and membranes, first trimester, not applicable or unspecified: Secondary | ICD-10-CM

## 2014-10-27 DIAGNOSIS — O26899 Other specified pregnancy related conditions, unspecified trimester: Secondary | ICD-10-CM

## 2014-10-27 DIAGNOSIS — Z833 Family history of diabetes mellitus: Secondary | ICD-10-CM | POA: Insufficient documentation

## 2014-10-27 DIAGNOSIS — Z3A01 Less than 8 weeks gestation of pregnancy: Secondary | ICD-10-CM | POA: Diagnosis not present

## 2014-10-27 DIAGNOSIS — R109 Unspecified abdominal pain: Secondary | ICD-10-CM

## 2014-10-27 LAB — URINALYSIS, ROUTINE W REFLEX MICROSCOPIC
Bilirubin Urine: NEGATIVE
GLUCOSE, UA: NEGATIVE mg/dL
Ketones, ur: NEGATIVE mg/dL
Leukocytes, UA: NEGATIVE
Nitrite: NEGATIVE
Protein, ur: NEGATIVE mg/dL
Specific Gravity, Urine: 1.025 (ref 1.005–1.030)
Urobilinogen, UA: 0.2 mg/dL (ref 0.0–1.0)
pH: 6 (ref 5.0–8.0)

## 2014-10-27 LAB — CBC WITH DIFFERENTIAL/PLATELET
Basophils Absolute: 0.1 10*3/uL (ref 0.0–0.1)
Basophils Relative: 1 % (ref 0–1)
EOS ABS: 0.1 10*3/uL (ref 0.0–0.7)
Eosinophils Relative: 1 % (ref 0–5)
HCT: 40 % (ref 36.0–46.0)
Hemoglobin: 14.4 g/dL (ref 12.0–15.0)
LYMPHS PCT: 18 % (ref 12–46)
Lymphs Abs: 1.6 10*3/uL (ref 0.7–4.0)
MCH: 33 pg (ref 26.0–34.0)
MCHC: 36 g/dL (ref 30.0–36.0)
MCV: 91.5 fL (ref 78.0–100.0)
Monocytes Absolute: 0.8 10*3/uL (ref 0.1–1.0)
Monocytes Relative: 8 % (ref 3–12)
NEUTROS ABS: 6.8 10*3/uL (ref 1.7–7.7)
Neutrophils Relative %: 72 % (ref 43–77)
Platelets: 254 10*3/uL (ref 150–400)
RBC: 4.37 MIL/uL (ref 3.87–5.11)
RDW: 12.8 % (ref 11.5–15.5)
WBC: 9.3 10*3/uL (ref 4.0–10.5)

## 2014-10-27 LAB — HCG, QUANTITATIVE, PREGNANCY: hCG, Beta Chain, Quant, S: 122380 m[IU]/mL — ABNORMAL HIGH (ref <5)

## 2014-10-27 LAB — URINE MICROSCOPIC-ADD ON

## 2014-10-27 LAB — POCT PREGNANCY, URINE: PREG TEST UR: POSITIVE — AB

## 2014-10-27 MED ORDER — RHO D IMMUNE GLOBULIN 1500 UNIT/2ML IJ SOSY
300.0000 ug | PREFILLED_SYRINGE | Freq: Once | INTRAMUSCULAR | Status: AC
  Administered 2014-10-27: 300 ug via INTRAMUSCULAR
  Filled 2014-10-27: qty 2

## 2014-10-27 MED ORDER — CONCEPT OB 130-92.4-1 MG PO CAPS: 1.0000 | ORAL_CAPSULE | Freq: Every day | ORAL | Status: DC

## 2014-10-27 NOTE — MAU Provider Note (Signed)
History     CSN: [id number]  Arrival date and time: 10/27/14 1507   First Provider Initiated Contact with Patient 10/27/14 1703      Chief Complaint  Patient presents with  . Abdominal Cramping  . Vaginal Bleeding   HPI  [name] is a 34 y.o. G5P1021 at [redacted]w[redacted]d who presents to MAU today with complaint of abdominal cramping and spotting. She states cramping is rated at 7/10 now. She has not taken anything for pain. She states pain started last night. She states brown spotting noted since 1330 today. She denies fever, UTI symptoms or N/V/D.   The patient also has a history of depression and has recently taken herself off of medications when she found out about pregnancy. She denies worsening of depression symptoms, suicidal ideations or homicidal ideations.   OB History    Gravida Para Term Preterm AB TAB SAB Ectopic Multiple Living   0 0 0 1      Past Medical History  Diagnosis Date  . Asthma   . Hypertension   . Anxiety   . Pregnancy induced hypertension   . Kidney stones   . Ovarian cyst   . Vaginal Pap smear, abnormal     bx, f/u ok  . Depression     doing good, not on meds now  . Missed abortion 10/02/2013    Past Surgical History  Procedure Laterality Date  . Cesarean section    . Dilation and curettage of uterus    . Dilation and evacuation N/A 10/04/2013    Procedure: DILATATION AND EVACUATION;  Surgeon: Sherian Rein, MD;  Location: [facility];  Service: Gynecology;  Laterality: N/A;  [place] in room     Family History  Problem Relation Age of Onset  . Hearing loss Mother   . Asthma Mother   . Diabetes Son   . Cancer Maternal Grandfather     bone  . Liver disease Maternal Grandfather   . Asthma Father   . Asthma Sister     History  Substance Use Topics  . Smoking status: Current Some Day Smoker -- 0.50 packs/day for 15 years    Types: Cigarettes  . Smokeless tobacco: Never Used     Comment: Smoking .5 ppd  . Alcohol Use: No      Comment: very occassionally    Allergies: No Known Allergies  Prescriptions prior to admission  Medication Sig Dispense Refill Last Dose  . Acetaminophen-Caff-Pyrilamine (MIDOL COMPLETE PO) Take 2 tablets by mouth daily as needed (pain/cramping).   Unknown at Unknown time  . albuterol (PROVENTIL HFA;VENTOLIN HFA) 108 (90 BASE) MCG/ACT inhaler Inhale 2 puffs into the lungs every 2 (two) hours as needed for wheezing or shortness of breath (cough). 1 Inhaler 3 Unknown at Unknown time  . amLODipine (NORVASC) 2.5 MG tablet Take 1 tablet (2.5 mg total) by mouth daily. 90 tablet 3 09/02/2014 at Unknown time  . azithromycin (ZITHROMAX Z-PAK) 250 MG tablet Take 2 pills today, then 1 pill daily until gone. (Patient not taking: Reported on 07/01/2014) 6 tablet 0 Unknown at Unknown time  . beclomethasone (QVAR) 80 MCG/ACT inhaler Inhale 2 puffs into the lungs 2 (two) times daily. 1 Inhaler 12 09/02/2014 at Unknown time  . beclomethasone (QVAR) 80 MCG/ACT inhaler Inhale 2 puffs into the lungs 2 (two) times daily. 1 Inhaler 6 Unknown at Unknown time  . buPROPion (WELLBUTRIN SR) 100 MG 12 hr tablet Take 100 mg by  mouth 2 (two) times daily.   Unknown at Unknown time  . FLUoxetine (PROZAC) 20 MG capsule Take 40 mg by mouth daily.    09/02/2014 at Unknown time  . HYDROcodone-acetaminophen (NORCO/VICODIN) 5-325 MG per tablet Take 1 tablet by mouth every 6 (six) hours as needed for moderate pain or severe pain. 8 tablet 0   . hydrOXYzine (ATARAX/VISTARIL) 25 MG tablet Take 1 tablet (25 mg total) by mouth every 4 (four) hours as needed for itching. (Patient not taking: Reported on 07/21/2014) 20 tablet 0 Unknown at Unknown time  . ibuprofen (ADVIL,MOTRIN) 200 MG tablet Take 800 mg by mouth every 6 (six) hours as needed for moderate pain.   Unknown at Unknown time  . meclizine (ANTIVERT) 25 MG tablet Take 1 tablet (25 mg total) by mouth 3 (three) times daily as needed for dizziness. 30 tablet 1   . metroNIDAZOLE  (FLAGYL) 500 MG tablet Take 1 tablet (500 mg total) by mouth 2 (two) times daily. (Patient not taking: Reported on 07/01/2014) 14 tablet 0 Unknown at Unknown time  . nicotine (NICODERM CQ) 21 mg/24hr patch Place 1 patch (21 mg total) onto the skin daily. (Patient not taking: Reported on 08/18/2014) 28 patch 0 Unknown at Unknown time  . predniSONE (DELTASONE) 20 MG tablet Take 2 tablets (40 mg total) by mouth daily. (Patient not taking: Reported on 07/01/2014) 10 tablet 0 Unknown at Unknown time  . solifenacin (VESICARE) 5 MG tablet Take 5 mg by mouth daily.   09/02/2014 at Unknown time  . Vitamin D, Ergocalciferol, (DRISDOL) 50000 UNITS CAPS capsule Take 1 capsule (50,000 Units total) by mouth every 7 (seven) days. 12 capsule 0 Unknown at Unknown time    Review of Systems  Constitutional: Positive for chills. Negative for fever and malaise/fatigue.  Gastrointestinal: Positive for abdominal pain. Negative for nausea, vomiting, diarrhea and constipation.  Genitourinary: Negative for dysuria, urgency and frequency.       + vaginal bleeding Neg - vaginal discharge   Physical Exam   Blood pressure 137/90, pulse 89, temperature 98.6 F (37 C), temperature source Oral, resp. rate 18, height  (1.626 m), weight 173 lb 9.6 oz (78.744 kg), last menstrual period 09/02/2014.  Physical Exam  Nursing note and vitals reviewed. Constitutional: She is oriented to person, place, and time. She appears well-developed and well-nourished. No distress.  HENT:  Head: Normocephalic and atraumatic.  Cardiovascular: Normal rate.   Respiratory: Effort normal.  GI: Soft. There is no tenderness.  Neurological: She is alert and oriented to person, place, and time.  Skin: Skin is warm and dry. No erythema.  Psychiatric: She has a normal mood and affect.   Results for orders placed or performed during the hospital encounter of 10/27/14 (from the past 24 hour(s))  Urinalysis, Routine w reflex microscopic (not at [facility])      Status: Abnormal   Collection Time: 10/27/14  3:40 PM  Result Value Ref Range   Color, Urine YELLOW YELLOW   APPearance CLEAR CLEAR   Specific Gravity, Urine 1.025 1.005 - 1.030   pH 6.0 5.0 - 8.0   Glucose, UA NEGATIVE NEGATIVE mg/dL   Hgb urine dipstick MODERATE (A) NEGATIVE   Bilirubin Urine NEGATIVE NEGATIVE   Ketones, ur NEGATIVE NEGATIVE mg/dL   Protein, ur NEGATIVE NEGATIVE mg/dL   Urobilinogen, UA 0.2 0.0 - 1.0 mg/dL   Nitrite NEGATIVE NEGATIVE   Leukocytes, UA NEGATIVE NEGATIVE  Urine microscopic-add on     Status: Abnormal   Collection  Time: 10/27/14  3:40 PM  Result Value Ref Range   Squamous Epithelial / LPF MANY (A) RARE   WBC, UA 0-2 <3 WBC/hpf   RBC / HPF 3-6 <3 RBC/hpf   Bacteria, UA FEW (A) RARE  Pregnancy, urine POC     Status: Abnormal   Collection Time: 10/27/14  3:45 PM  Result Value Ref Range   Preg Test, Ur POSITIVE (A) NEGATIVE  CBC with Differential/Platelet     Status: None   Collection Time: 10/27/14  4:10 PM  Result Value Ref Range   WBC 9.3 4.0 - 10.5 K/uL   RBC 4.37 3.87 - 5.11 MIL/uL   Hemoglobin 14.4 12.0 - 15.0 g/dL   HCT 32.6 71.2 - 45.8 %   MCV 91.5 78.0 - 100.0 fL   MCH 33.0 26.0 - 34.0 pg   MCHC 36.0 30.0 - 36.0 g/dL   RDW 09.9 83.3 - 82.5 %   Platelets 254 150 - 400 K/uL   Neutrophils Relative % 72 43 - 77 %   Neutro Abs 6.8 1.7 - 7.7 K/uL   Lymphocytes Relative 18 12 - 46 %   Lymphs Abs 1.6 0.7 - 4.0 K/uL   Monocytes Relative 8 3 - 12 %   Monocytes Absolute 0.8 0.1 - 1.0 K/uL   Eosinophils Relative 1 0 - 5 %   Eosinophils Absolute 0.1 0.0 - 0.7 K/uL   Basophils Relative 1 0 - 1 %   Basophils Absolute 0.1 0.0 - 0.1 K/uL  Rh IG workup (includes ABO/Rh)     Status: None (Preliminary result)   Collection Time: 10/27/14  4:10 PM  Result Value Ref Range   Gestational Age(Wks) 7    ABO/RH(D) O NEG    Antibody Screen PENDING     MAU Course  Procedures None  MDM +UPT UA, CBC, quant hCG, HIV, RPR and [place] today to rule out  ectopic pregnancy O negative blood type in Epic from previous visit. Rhogam work-up ordered. Rhophylac ordered.  Wet prep and GC/chlamydia not performed. Patient is asymptomatic and all results were final prior to a room for pelvic exam becoming available.  Assessment and Plan  A: SIUP at 7w Small subchorionic hemorrhage Depression Chronic HTN  P: Discharge home Rx for PNV sent to patient's pharmacy Warning signs for HTN  Discussed First trimester warning signs discussed Patient advised to follow-up with OB provider of choice to start prenatal care  If unable to start prenatal care soon, follow-up with [facility] for medications adjustments Patient may return to MAU as needed or if her condition were to change or worsen   Marny Lowenstein, PA-C  10/27/2014, 5:04 PM

## 2014-10-27 NOTE — MAU Note (Signed)
Pt reports she has had abd cramping and dark brown spotting.

## 2014-10-27 NOTE — Discharge Instructions (Signed)
First Trimester of Pregnancy The first trimester of pregnancy is from week 1 until the end of week 12 (months 1 through 3). During this time, your baby will begin to develop inside you. At 6-8 weeks, the eyes and face are formed, and the heartbeat can be seen on ultrasound. At the end of 12 weeks, all the baby's organs are formed. Prenatal care is all the medical care you receive before the birth of your baby. Make sure you get good prenatal care and follow all of your doctor's instructions. HOME CARE  Medicines  Take medicine only as told by your doctor. Some medicines are safe and some are not during pregnancy.  Take your prenatal vitamins as told by your doctor.  Take medicine that helps you poop (stool softener) as needed if your doctor says it is okay. Diet  Eat regular, healthy meals.  Your doctor will tell you the amount of weight gain that is right for you.  Avoid raw meat and uncooked cheese.  If you feel sick to your stomach (nauseous) or throw up (vomit):  Eat 4 or 5 small meals a day instead of 3 large meals.  Try eating a few soda crackers.  Drink liquids between meals instead of during meals.  If you have a hard time pooping (constipation):  Eat high-fiber foods like fresh vegetables, fruit, and whole grains.  Drink enough fluids to keep your pee (urine) clear or pale yellow. Activity and Exercise  Exercise only as told by your doctor. Stop exercising if you have cramps or pain in your lower belly (abdomen) or low back.  Try to avoid standing for long periods of time. Move your legs often if you must stand in one place for a long time.  Avoid heavy lifting.  Wear low-heeled shoes. Sit and stand up straight.  You can have sex unless your doctor tells you not to. Relief of Pain or Discomfort  Wear a good support bra if your breasts are sore.  Take warm water baths (sitz baths) to soothe pain or discomfort caused by hemorrhoids. Use hemorrhoid cream if your  doctor says it is okay.  Rest with your legs raised if you have leg cramps or low back pain.  Wear support hose if you have puffy, bulging veins (varicose veins) in your legs. Raise (elevate) your feet for 15 minutes, 3-4 times a day. Limit salt in your diet. Prenatal Care  Schedule your prenatal visits by the twelfth week of pregnancy.  Write down your questions. Take them to your prenatal visits.  Keep all your prenatal visits as told by your doctor. Safety  Wear your seat belt at all times when driving.  Make a list of emergency phone numbers. The list should include numbers for family, friends, the hospital, and police and fire departments. General Tips  Ask your doctor for a referral to a local prenatal class. Begin classes no later than at the start of month 6 of your pregnancy.  Ask for help if you need counseling or help with nutrition. Your doctor can give you advice or tell you where to go for help.  Do not use hot tubs, steam rooms, or saunas.  Do not douche or use tampons or scented sanitary pads.  Do not cross your legs for long periods of time.  Avoid litter boxes and soil used by cats.  Avoid all smoking, herbs, and alcohol. Avoid drugs not approved by your doctor.  Visit your dentist. At home, brush your teeth   with a soft toothbrush. Be gentle when you floss. GET HELP IF:  You are dizzy.  You have mild cramps or pressure in your lower belly.  You have a nagging pain in your belly area.  You continue to feel sick to your stomach, throw up, or have watery poop (diarrhea).  You have a bad smelling fluid coming from your vagina.  You have pain with peeing (urination).  You have increased puffiness (swelling) in your face, hands, legs, or ankles. GET HELP RIGHT AWAY IF:   You have a fever.  You are leaking fluid from your vagina.  You have spotting or bleeding from your vagina.  You have very bad belly cramping or pain.  You gain or lose weight  rapidly.  You throw up blood. It may look like coffee grounds.  You are around people who have German measles, fifth disease, or chickenpox.  You have a very bad headache.  You have shortness of breath.  You have any kind of trauma, such as from a fall or a car accident. Document Released: 10/23/2007 Document Revised: 09/20/2013 Document Reviewed: 03/16/2013 Exit[employer] Patient Information 2015 [employer]. This information is not intended to replace advice given to you by your health care provider. Make sure you discuss any questions you have with your health care provider.  

## 2014-10-28 LAB — RH IG WORKUP (INCLUDES ABO/RH)
ABO/RH(D): O NEG
ANTIBODY SCREEN: NEGATIVE
Gestational Age(Wks): 7
UNIT DIVISION: 0

## 2014-10-28 LAB — HIV ANTIBODY (ROUTINE TESTING W REFLEX): HIV Screen 4th Generation wRfx: NONREACTIVE

## 2014-10-28 LAB — RPR: RPR Ser Ql: NONREACTIVE

## 2014-10-31 ENCOUNTER — Inpatient Hospital Stay (HOSPITAL_COMMUNITY): Payer: Medicaid Other

## 2014-10-31 ENCOUNTER — Encounter (HOSPITAL_COMMUNITY): Payer: Self-pay

## 2014-10-31 ENCOUNTER — Inpatient Hospital Stay (HOSPITAL_COMMUNITY)
Admission: AD | Admit: 2014-10-31 | Discharge: 2014-10-31 | Disposition: A | Discharge status: Home / Self Care | Payer: Medicaid Other | Source: Ambulatory Visit | Attending: Obstetrics and Gynecology | Admitting: Obstetrics and Gynecology

## 2014-10-31 DIAGNOSIS — O4691 Antepartum hemorrhage, unspecified, first trimester: Secondary | ICD-10-CM | POA: Diagnosis not present

## 2014-10-31 DIAGNOSIS — Z3A08 8 weeks gestation of pregnancy: Secondary | ICD-10-CM | POA: Diagnosis not present

## 2014-10-31 DIAGNOSIS — R102 Pelvic and perineal pain: Secondary | ICD-10-CM | POA: Insufficient documentation

## 2014-10-31 DIAGNOSIS — R109 Unspecified abdominal pain: Secondary | ICD-10-CM | POA: Diagnosis present

## 2014-10-31 DIAGNOSIS — O43891 Other placental disorders, first trimester: Secondary | ICD-10-CM

## 2014-10-31 DIAGNOSIS — O26891 Other specified pregnancy related conditions, first trimester: Secondary | ICD-10-CM

## 2014-10-31 DIAGNOSIS — O209 Hemorrhage in early pregnancy, unspecified: Secondary | ICD-10-CM

## 2014-10-31 LAB — URINALYSIS, ROUTINE W REFLEX MICROSCOPIC
BILIRUBIN URINE: NEGATIVE
Glucose, UA: NEGATIVE mg/dL
Ketones, ur: NEGATIVE mg/dL
Leukocytes, UA: NEGATIVE
Nitrite: NEGATIVE
Protein, ur: NEGATIVE mg/dL
Specific Gravity, Urine: 1.025 (ref 1.005–1.030)
Urobilinogen, UA: 0.2 mg/dL (ref 0.0–1.0)
pH: 6 (ref 5.0–8.0)

## 2014-10-31 LAB — URINE MICROSCOPIC-ADD ON

## 2014-10-31 MED ORDER — PROMETHAZINE HCL 25 MG PO TABS
12.5000 mg | ORAL_TABLET | Freq: Four times a day (QID) | ORAL | Status: DC | PRN
Start: 2014-10-31 — End: 2015-04-14

## 2014-10-31 NOTE — MAU Note (Signed)
Pt presents complaining of bright red vaginal bleeding that started 2 hours ago and she has had to change her pad 2 times in an hour. Also has lower abdominal pain and a pain in her right shoulder.

## 2014-10-31 NOTE — Discharge Instructions (Signed)
Subchorionic Hematoma °A subchorionic hematoma is a gathering of blood between the outer wall of the placenta and the inner wall of the womb (uterus). The placenta is the organ that connects the fetus to the wall of the uterus. The placenta performs the feeding, breathing (oxygen to the fetus), and waste removal (excretory work) of the fetus.  °Subchorionic hematoma is the most common abnormality found on a result from ultrasonography done during the first trimester or early second trimester of pregnancy. If there has been little or no vaginal bleeding, early small hematomas usually shrink on their own and do not affect your baby or pregnancy. The blood is gradually absorbed over 1-2 weeks. When bleeding starts later in pregnancy or the hematoma is larger or occurs in an older pregnant woman, the outcome may not be as good. Larger hematomas may get bigger, which increases the chances for miscarriage. Subchorionic hematoma also increases the risk of premature detachment of the placenta from the uterus, preterm (premature) labor, and stillbirth. °HOME CARE INSTRUCTIONS °· Stay on bed rest if your health care provider recommends this. Although bed rest will not prevent more bleeding or prevent a miscarriage, your health care provider may recommend bed rest until you are advised otherwise. °· Avoid heavy lifting (more than 10 lb [4.5 kg]), exercise, sexual intercourse, or douching as directed by your health care provider. °· Keep track of the number of pads you use each day and how soaked (saturated) they are. Write down this information. °· Do not use tampons. °· Keep all follow-up appointments as directed by your health care provider. Your health care provider may ask you to have follow-up blood tests or ultrasound tests or both. °SEEK IMMEDIATE MEDICAL CARE IF: °· You have severe cramps in your stomach, back, abdomen, or pelvis. °· You have a fever. °· You pass large clots or tissue. Save any tissue for your health  care provider to look at. °· Your bleeding increases or you become lightheaded, feel weak, or have fainting episodes. °Document Released: 08/21/2006 Document Revised: 09/20/2013 Document Reviewed: 12/03/2012 °ExitCare® Patient Information ©2015 [employer]. This information is not intended to replace advice given to you by your health care provider. Make sure you discuss any questions you have with your health care provider. ° °

## 2014-10-31 NOTE — MAU Note (Signed)
Pt told as per Zorita Pang CNM to keep taking Amlodipine po until she sees her primary care doctor.

## 2014-10-31 NOTE — MAU Provider Note (Signed)
History     CSN: [id number]  Arrival date and time: 10/31/14 1836   First Provider Initiated Contact with Patient 10/31/14 1919      Chief Complaint  Patient presents with  . Abdominal Pain   HPI Comments: [name] is a 34 y.o. female [phone] presenting to MAU with abdominal pain and vaginal bleeding. She has had some spotting throughout the pregnancy which started on 6/9; she came here to MAU and US showed an IUP with a subchorionic hemorrhage. She became concerned when the bleeding changed from dark red to pink   Abdominal pain is located in the lower part of her stomach; both sides. She currently rates her pain 8/10. She has not taken anything for pain.   Last intercourse was last night.   Abdominal Pain This is a new problem. The current episode started today. The onset quality is gradual. The problem occurs constantly. The problem has been unchanged. The pain is located in the suprapubic region. The pain is at a severity of 8/10. The quality of the pain is cramping. Associated symptoms include nausea. Pertinent negatives include no dysuria or fever. She has tried nothing for the symptoms.  Vaginal Bleeding The patient's primary symptoms include vaginal bleeding. This is a recurrent problem. The problem occurs intermittently. The pain is severe. The problem affects both sides. She is pregnant. Associated symptoms include abdominal pain, chills and nausea. Pertinent negatives include no dysuria or fever. The vaginal discharge was bloody. The vaginal bleeding is lighter than menses. She has not been passing clots. She has not been passing tissue. The symptoms are aggravated by intercourse.      OB History    Gravida Para Term Preterm AB TAB SAB Ectopic Multiple Living   0 0 0 1      Past Medical History  Diagnosis Date  . Asthma   . Hypertension   . Anxiety   . Pregnancy induced hypertension   . Kidney stones   . Ovarian cyst   . Vaginal Pap smear,  abnormal     bx, f/u ok  . Depression     doing good, not on meds now  . Missed abortion 10/02/2013    Past Surgical History  Procedure Laterality Date  . Cesarean section    . Dilation and curettage of uterus    . Dilation and evacuation N/A 10/04/2013    Procedure: DILATATION AND EVACUATION;  Surgeon: Sherian Rein, MD;  Location: [facility];  Service: Gynecology;  Laterality: N/A;  [place] in room     Family History  Problem Relation Age of Onset  . Hearing loss Mother   . Asthma Mother   . Diabetes Son   . Cancer Maternal Grandfather     bone  . Liver disease Maternal Grandfather   . Asthma Father   . Asthma Sister     History  Substance Use Topics  . Smoking status: Current Some Day Smoker -- 0.50 packs/day for 15 years    Types: Cigarettes  . Smokeless tobacco: Never Used     Comment: Smoking .5 ppd  . Alcohol Use: No     Comment: very occassionally    Allergies: No Known Allergies  Prescriptions prior to admission  Medication Sig Dispense Refill Last Dose  . albuterol (PROVENTIL HFA;VENTOLIN HFA) 108 (90 BASE) MCG/ACT inhaler Inhale 2 puffs into the lungs every 2 (two) hours as needed for wheezing or shortness of breath (cough). 1 Inhaler  3 10/30/2014 at Unknown time  . amLODipine (NORVASC) 2.5 MG tablet Take 1 tablet (2.5 mg total) by mouth daily. 90 tablet 3 Past Month at Unknown time  . beclomethasone (QVAR) 80 MCG/ACT inhaler Inhale 2 puffs into the lungs 2 (two) times daily. 1 Inhaler 12 10/31/2014 at Unknown time  . FLUoxetine (PROZAC) 40 MG capsule Take 40 mg by mouth daily.   Past Month at Unknown time  . Lurasidone HCl (LATUDA) 20 MG TABS Take 1 tablet by mouth at bedtime.   Past Month at Unknown time  . meclizine (ANTIVERT) 25 MG tablet Take 25 mg by mouth 3 (three) times daily as needed for dizziness.   Past Month at Unknown time  . Prenat w/o A Vit-FeFum-FePo-FA (CONCEPT OB) 130-92.4-1 MG CAPS Take 1 tablet by mouth daily. 30 capsule 11 10/31/2014 at Unknown  time  . tolterodine (DETROL LA) 4 MG 24 hr capsule Take 4 mg by mouth daily.   Past Month at Unknown time  . traZODone (DESYREL) 50 MG tablet Take 50 mg by mouth at bedtime as needed for sleep.    Past Month at Unknown time  . beclomethasone (QVAR) 80 MCG/ACT inhaler Inhale 2 puffs into the lungs 2 (two) times daily. (Patient not taking: Reported on 10/31/2014) 1 Inhaler 6 Not Taking at Unknown time  . Vitamin D, Ergocalciferol, (DRISDOL) 50000 UNITS CAPS capsule Take 1 capsule (50,000 Units total) by mouth every 7 (seven) days. (Patient not taking: Reported on 10/31/2014) 12 capsule 0 Not Taking at Unknown time   Results for orders placed or performed during the hospital encounter of 10/31/14 (from the past 48 hour(s))  Urinalysis, Routine w reflex microscopic (not at [facility])     Status: Abnormal   Collection Time: 10/31/14  6:45 PM  Result Value Ref Range   Color, Urine YELLOW YELLOW   APPearance CLEAR CLEAR   Specific Gravity, Urine 1.025 1.005 - 1.030   pH 6.0 5.0 - 8.0   Glucose, UA NEGATIVE NEGATIVE mg/dL   Hgb urine dipstick LARGE (A) NEGATIVE   Bilirubin Urine NEGATIVE NEGATIVE   Ketones, ur NEGATIVE NEGATIVE mg/dL   Protein, ur NEGATIVE NEGATIVE mg/dL   Urobilinogen, UA 0.2 0.0 - 1.0 mg/dL   Nitrite NEGATIVE NEGATIVE   Leukocytes, UA NEGATIVE NEGATIVE  Urine microscopic-add on     Status: None   Collection Time: 10/31/14  6:45 PM  Result Value Ref Range   Squamous Epithelial / LPF RARE RARE   WBC, UA 0-2 <3 WBC/hpf   RBC / HPF 3-6 <3 RBC/hpf   Bacteria, UA RARE RARE    Review of Systems  Constitutional: Positive for chills. Negative for fever.  Gastrointestinal: Positive for nausea and abdominal pain.  Genitourinary: Positive for vaginal bleeding. Negative for dysuria.   Physical Exam   Blood pressure 128/81, pulse 82, temperature 98 F (36.7 C), temperature source Oral, resp. rate 18, last menstrual period 09/02/2014.  Physical Exam  Nursing note and vitals  reviewed. Constitutional: She is oriented to person, place, and time. She appears well-developed and well-nourished. No distress.  HENT:  Head: Normocephalic.  Eyes: Pupils are equal, round, and reactive to light.  Neck: Neck supple.  Respiratory: Effort normal.  GI: Soft. There is no tenderness.  Genitourinary:  Cervix closed, posterior.  Small amount of dark red blood noted on exam glove.   Musculoskeletal: Normal range of motion.  Neurological: She is alert and oriented to person, place, and time.  Skin: Skin is warm. She is  not diaphoretic.  Psychiatric: Her behavior is normal.   Results for orders placed or performed during the hospital encounter of 10/31/14 (from the past 24 hour(s))  Urinalysis, Routine w reflex microscopic (not at [facility])     Status: Abnormal   Collection Time: 10/31/14  6:45 PM  Result Value Ref Range   Color, Urine YELLOW YELLOW   APPearance CLEAR CLEAR   Specific Gravity, Urine 1.025 1.005 - 1.030   pH 6.0 5.0 - 8.0   Glucose, UA NEGATIVE NEGATIVE mg/dL   Hgb urine dipstick LARGE (A) NEGATIVE   Bilirubin Urine NEGATIVE NEGATIVE   Ketones, ur NEGATIVE NEGATIVE mg/dL   Protein, ur NEGATIVE NEGATIVE mg/dL   Urobilinogen, UA 0.2 0.0 - 1.0 mg/dL   Nitrite NEGATIVE NEGATIVE   Leukocytes, UA NEGATIVE NEGATIVE  Urine microscopic-add on     Status: None   Collection Time: 10/31/14  6:45 PM  Result Value Ref Range   Squamous Epithelial / LPF RARE RARE   WBC, UA 0-2 <3 WBC/hpf   RBC / HPF 3-6 <3 RBC/hpf   Bacteria, UA RARE RARE   US Ob Transvaginal  10/31/2014   CLINICAL DATA:  Worsening vaginal bleeding, acute onset. Initial encounter.  EXAM: TRANSVAGINAL OB ULTRASOUND  TECHNIQUE: Transvaginal ultrasound was performed for complete evaluation of the gestation as well as the maternal uterus, adnexal regions, and pelvic cul-de-sac.  COMPARISON:  Pelvic ultrasound performed 10/26/2004  FINDINGS: Intrauterine gestational sac: Visualized/normal in shape.  Yolk sac:   Yes  Embryo:  Yes  Cardiac Activity: Yes  Heart Rate: 162 bpm  CRL:   1.33 cm   7 w 5 d                  [place] EDC: 06/14/2015  Maternal uterus/adnexae: A moderate amount of subchorionic hemorrhage is noted, measuring 3.5 x 2.9 x 2.6 cm. The uterus is otherwise unremarkable in appearance.  The ovaries are not visualized on this study.  No free fluid is seen within the pelvic cul-de-sac.  IMPRESSION: 1. Single live intrauterine pregnancy noted, with a crown-rump length of 1.3 cm, corresponding to a gestational age of [redacted] weeks 5 days. This matches the gestational age of [redacted] weeks 4 days by the prior ultrasound, reflecting an estimated date of delivery of June 15, 2015. 2. Moderate amount of subchorionic hemorrhage noted, increased in size from the prior study.   Electronically Signed   By: Roanna Raider M.D.   On: 10/31/2014 20:14    MAU Course  Procedures  None  MDM  O negative blood type; patient received rhogam   [place] to evaluate viability .   Assessment and Plan   1. Vaginal bleeding in pregnancy, first trimester   2. Subchorionic hematoma, first trimester   3. Pelvic pain affecting pregnancy in first trimester, antepartum    DC home Comfort measures reviewed  1st Trimester precautions  Bleeding precautions RX: phenergan 12.5-25mg  q6 hours #30, 0RF Return to MAU as needed FU with OB as planned  Follow-up Information    Schedule an appointment as soon as possible for a visit with [facility] [place].   Contact information:   [address] [facility] [place] [address] [phone]      Thressa Sheller Phineas Semen, NP 10/31/2014 7:41 PM

## 2014-11-07 ENCOUNTER — Telehealth: Payer: Self-pay | Admitting: Internal Medicine

## 2014-11-07 NOTE — Telephone Encounter (Signed)
Pt called requesting to speak to nurse about her being 2 month pregnant , and wants to know what medication she should stop taking. Please f/u with patient to review medications.

## 2014-11-16 ENCOUNTER — Telehealth: Payer: Self-pay

## 2014-11-16 NOTE — Telephone Encounter (Signed)
Patient called stating she is two months pregnant and not sure Of what medications she should be taking Instructed patient to follow up with ob/gyn Patient has an appointment next week

## 2014-11-16 NOTE — Telephone Encounter (Signed)
Patient called returning nurse's phone, please f/u

## 2014-11-16 NOTE — Telephone Encounter (Signed)
Returned patient phone call Patient not available Left message on voice mail to return our call 

## 2014-11-23 ENCOUNTER — Other Ambulatory Visit (HOSPITAL_COMMUNITY): Payer: Self-pay | Admitting: Obstetrics

## 2014-11-23 DIAGNOSIS — O10911 Unspecified pre-existing hypertension complicating pregnancy, first trimester: Secondary | ICD-10-CM

## 2014-11-23 DIAGNOSIS — Z3682 Encounter for antenatal screening for nuchal translucency: Secondary | ICD-10-CM

## 2014-11-23 LAB — OB RESULTS CONSOLE HEPATITIS B SURFACE ANTIGEN: Hepatitis B Surface Ag: NEGATIVE

## 2014-11-23 LAB — OB RESULTS CONSOLE RUBELLA ANTIBODY, IGM: RUBELLA: IMMUNE

## 2014-11-24 ENCOUNTER — Other Ambulatory Visit (HOSPITAL_COMMUNITY): Payer: Self-pay | Admitting: Obstetrics

## 2014-11-24 DIAGNOSIS — Z3491 Encounter for supervision of normal pregnancy, unspecified, first trimester: Secondary | ICD-10-CM

## 2014-11-28 ENCOUNTER — Ambulatory Visit (HOSPITAL_COMMUNITY): Payer: Medicaid Other

## 2014-12-01 ENCOUNTER — Encounter (HOSPITAL_COMMUNITY): Payer: Self-pay

## 2014-12-01 ENCOUNTER — Ambulatory Visit (HOSPITAL_COMMUNITY)
Admission: RE | Admit: 2014-12-01 | Discharge: 2014-12-01 | Disposition: A | Discharge status: Home / Self Care | Payer: Medicaid Other | Source: Ambulatory Visit | Attending: Obstetrics | Admitting: Obstetrics

## 2014-12-01 DIAGNOSIS — Z3682 Encounter for antenatal screening for nuchal translucency: Secondary | ICD-10-CM

## 2014-12-01 DIAGNOSIS — Z3A00 Weeks of gestation of pregnancy not specified: Secondary | ICD-10-CM | POA: Diagnosis not present

## 2014-12-01 DIAGNOSIS — O10911 Unspecified pre-existing hypertension complicating pregnancy, first trimester: Secondary | ICD-10-CM

## 2014-12-01 DIAGNOSIS — Z3491 Encounter for supervision of normal pregnancy, unspecified, first trimester: Secondary | ICD-10-CM

## 2014-12-08 ENCOUNTER — Other Ambulatory Visit (HOSPITAL_COMMUNITY): Payer: Self-pay | Admitting: Obstetrics

## 2014-12-26 ENCOUNTER — Inpatient Hospital Stay (HOSPITAL_COMMUNITY)
Admission: AD | Admit: 2014-12-26 | Discharge: 2014-12-26 | Disposition: A | Discharge status: Home / Self Care | Payer: Medicaid Other | Source: Ambulatory Visit | Attending: Obstetrics | Admitting: Obstetrics

## 2014-12-26 ENCOUNTER — Encounter (HOSPITAL_COMMUNITY): Payer: Self-pay

## 2014-12-26 DIAGNOSIS — F17210 Nicotine dependence, cigarettes, uncomplicated: Secondary | ICD-10-CM | POA: Insufficient documentation

## 2014-12-26 DIAGNOSIS — O99332 Smoking (tobacco) complicating pregnancy, second trimester: Secondary | ICD-10-CM | POA: Insufficient documentation

## 2014-12-26 DIAGNOSIS — Z3A15 15 weeks gestation of pregnancy: Secondary | ICD-10-CM | POA: Diagnosis not present

## 2014-12-26 DIAGNOSIS — G43909 Migraine, unspecified, not intractable, without status migrainosus: Secondary | ICD-10-CM

## 2014-12-26 DIAGNOSIS — O9989 Other specified diseases and conditions complicating pregnancy, childbirth and the puerperium: Secondary | ICD-10-CM | POA: Diagnosis not present

## 2014-12-26 LAB — URINALYSIS, ROUTINE W REFLEX MICROSCOPIC
Bilirubin Urine: NEGATIVE
Glucose, UA: NEGATIVE mg/dL
HGB URINE DIPSTICK: NEGATIVE
KETONES UR: NEGATIVE mg/dL
Leukocytes, UA: NEGATIVE
Nitrite: NEGATIVE
PH: 6.5 (ref 5.0–8.0)
PROTEIN: NEGATIVE mg/dL
Specific Gravity, Urine: 1.02 (ref 1.005–1.030)
Urobilinogen, UA: 0.2 mg/dL (ref 0.0–1.0)

## 2014-12-26 MED ORDER — NALBUPHINE HCL 10 MG/ML IJ SOLN
10.0000 mg | Freq: Once | INTRAMUSCULAR | Status: AC
  Administered 2014-12-26: 10 mg via INTRAMUSCULAR
  Filled 2014-12-26: qty 1

## 2014-12-26 MED ORDER — PROMETHAZINE HCL 25 MG/ML IJ SOLN
12.5000 mg | Freq: Once | INTRAMUSCULAR | Status: DC
  Filled 2014-12-26: qty 1

## 2014-12-26 MED ORDER — PROMETHAZINE HCL 25 MG/ML IJ SOLN
12.5000 mg | Freq: Once | INTRAMUSCULAR | Status: AC
  Administered 2014-12-26: 12.5 mg via INTRAMUSCULAR

## 2014-12-26 MED ORDER — DEXAMETHASONE SODIUM PHOSPHATE 10 MG/ML IJ SOLN
10.0000 mg | Freq: Once | INTRAMUSCULAR | Status: AC
Start: 2014-12-26 — End: 2014-12-26
  Administered 2014-12-26: 10 mg via INTRAVENOUS
  Filled 2014-12-26: qty 1

## 2014-12-26 MED ORDER — METOCLOPRAMIDE HCL 5 MG/ML IJ SOLN
10.0000 mg | Freq: Once | INTRAMUSCULAR | Status: AC
  Administered 2014-12-26: 10 mg via INTRAVENOUS
  Filled 2014-12-26: qty 2

## 2014-12-26 MED ORDER — DIPHENHYDRAMINE HCL 50 MG/ML IJ SOLN
25.0000 mg | Freq: Once | INTRAMUSCULAR | Status: AC
  Administered 2014-12-26: 25 mg via INTRAVENOUS
  Filled 2014-12-26: qty 1

## 2014-12-26 MED ORDER — LACTATED RINGERS IV BOLUS (SEPSIS)
1000.0000 mL | Freq: Once | INTRAVENOUS | Status: AC
  Administered 2014-12-26: 1000 mL via INTRAVENOUS

## 2014-12-26 NOTE — Discharge Instructions (Signed)
migrHeadaches, Frequently Asked Questions MIGRAINE HEADACHES Q: What is migraine? What causes it? How can I treat it? A: Generally, migraine headaches begin as a dull ache. Then they develop into a constant, throbbing, and pulsating pain. You may experience pain at the temples. You may experience pain at the front or back of one or both sides of the head. The pain is usually accompanied by a combination of:  Nausea.  Vomiting.  Sensitivity to light and noise. Some people (about 15%) experience an aura (see below) before an attack. The cause of migraine is believed to be chemical reactions in the brain. Treatment for migraine may include over-the-counter or prescription medications. It may also include self-help techniques. These include relaxation training and biofeedback.  Q: What is an aura? A: About 15% of people with migraine get an "aura". This is a sign of neurological symptoms that occur before a migraine headache. You may see wavy or jagged lines, dots, or flashing lights. You might experience tunnel vision or blind spots in one or both eyes. The aura can include visual or auditory hallucinations (something imagined). It may include disruptions in smell (such as strange odors), taste or touch. Other symptoms include:  Numbness.  A "pins and needles" sensation.  Difficulty in recalling or speaking the correct word. These neurological events may last as long as 60 minutes. These symptoms will fade as the headache begins. Q: What is a trigger? A: Certain physical or environmental factors can lead to or "trigger" a migraine. These include:  Foods.  Hormonal changes.  Weather.  Stress. It is important to remember that triggers are different for everyone. To help prevent migraine attacks, you need to figure out which triggers affect you. Keep a headache diary. This is a good way to track triggers. The diary will help you talk to your healthcare professional about your condition. Q:  Does weather affect migraines? A: Bright sunshine, hot, humid conditions, and drastic changes in barometric pressure may lead to, or "trigger," a migraine attack in some people. But studies have shown that weather does not act as a trigger for everyone with migraines. Q: What is the link between migraine and hormones? A: Hormones start and regulate many of your body's functions. Hormones keep your body in balance within a constantly changing environment. The levels of hormones in your body are unbalanced at times. Examples are during menstruation, pregnancy, or menopause. That can lead to a migraine attack. In fact, about three quarters of all women with migraine report that their attacks are related to the menstrual cycle.  Q: Is there an increased risk of stroke for migraine sufferers? A: The likelihood of a migraine attack causing a stroke is very remote. That is not to say that migraine sufferers cannot have a stroke associated with their migraines. In persons under age 44, the most common associated factor for stroke is migraine headache. But over the course of a person's normal life span, the occurrence of migraine headache may actually be associated with a reduced risk of dying from cerebrovascular disease due to stroke.  Q: What are acute medications for migraine? A: Acute medications are used to treat the pain of the headache after it has started. Examples over-the-counter medications, NSAIDs, ergots, and triptans.  Q: What are the triptans? A: Triptans are the newest class of abortive medications. They are specifically targeted to treat migraine. Triptans are vasoconstrictors. They moderate some chemical reactions in the brain. The triptans work on receptors in your brain. Triptans help  to restore the balance of a neurotransmitter called serotonin. Fluctuations in levels of serotonin are thought to be a main cause of migraine.  °Q: Are over-the-counter medications for migraine effective? °A:  Over-the-counter, or "OTC," medications may be effective in relieving mild to moderate pain and associated symptoms of migraine. But you should see your caregiver before beginning any treatment regimen for migraine.  °Q: What are preventive medications for migraine? °A: Preventive medications for migraine are sometimes referred to as "prophylactic" treatments. They are used to reduce the frequency, severity, and length of migraine attacks. Examples of preventive medications include antiepileptic medications, antidepressants, beta-blockers, calcium channel blockers, and NSAIDs (nonsteroidal anti-inflammatory drugs). °Q: Why are anticonvulsants used to treat migraine? °A: During the past few years, there has been an increased interest in antiepileptic drugs for the prevention of migraine. They are sometimes referred to as "anticonvulsants". Both epilepsy and migraine may be caused by similar reactions in the brain.  °Q: Why are antidepressants used to treat migraine? °A: Antidepressants are typically used to treat people with depression. They may reduce migraine frequency by regulating chemical levels, such as serotonin, in the brain.  °Q: What alternative therapies are used to treat migraine? °A: The term "alternative therapies" is often used to describe treatments considered outside the scope of conventional Western medicine. Examples of alternative therapy include acupuncture, acupressure, and yoga. Another common alternative treatment is herbal therapy. Some herbs are believed to relieve headache pain. Always discuss alternative therapies with your caregiver before proceeding. Some herbal products contain arsenic and other toxins. °TENSION HEADACHES °Q: What is a tension-type headache? What causes it? How can I treat it? °A: Tension-type headaches occur randomly. They are often the result of temporary stress, anxiety, fatigue, or anger. Symptoms include soreness in your temples, a tightening band-like sensation  around your head (a "vice-like" ache). Symptoms can also include a pulling feeling, pressure sensations, and contracting head and neck muscles. The headache begins in your forehead, temples, or the back of your head and neck. Treatment for tension-type headache may include over-the-counter or prescription medications. Treatment may also include self-help techniques such as relaxation training and biofeedback. °CLUSTER HEADACHES °Q: What is a cluster headache? What causes it? How can I treat it? °A: Cluster headache gets its name because the attacks come in groups. The pain arrives with little, if any, warning. It is usually on one side of the head. A tearing or bloodshot eye and a runny nose on the same side of the headache may also accompany the pain. Cluster headaches are believed to be caused by chemical reactions in the brain. They have been described as the most severe and intense of any headache type. Treatment for cluster headache includes prescription medication and oxygen. °SINUS HEADACHES °Q: What is a sinus headache? What causes it? How can I treat it? °A: When a cavity in the bones of the face and skull (a sinus) becomes inflamed, the inflammation will cause localized pain. This condition is usually the result of an allergic reaction, a tumor, or an infection. If your headache is caused by a sinus blockage, such as an infection, you will probably have a fever. An x-ray will confirm a sinus blockage. Your caregiver's treatment might include antibiotics for the infection, as well as antihistamines or decongestants.  °REBOUND HEADACHES °Q: What is a rebound headache? What causes it? How can I treat it? °A: A pattern of taking acute headache medications too often can lead to a condition known as "rebound headache."   A pattern of taking too much headache medication includes taking it more than 2 days per week or in excessive amounts. That means more than the label or a caregiver advises. With rebound  headaches, your medications not only stop relieving pain, they actually begin to cause headaches. Doctors treat rebound headache by tapering the medication that is being overused. Sometimes your caregiver will gradually substitute a different type of treatment or medication. Stopping may be a challenge. Regularly overusing a medication increases the potential for serious side effects. Consult a caregiver if you regularly use headache medications more than 2 days per week or more than the label advises. ADDITIONAL QUESTIONS AND ANSWERS Q: What is biofeedback? A: Biofeedback is a self-help treatment. Biofeedback uses special equipment to monitor your body's involuntary physical responses. Biofeedback monitors:  Breathing.  Pulse.  Heart rate.  Temperature.  Muscle tension.  Brain activity. Biofeedback helps you refine and perfect your relaxation exercises. You learn to control the physical responses that are related to stress. Once the technique has been mastered, you do not need the equipment any more. Q: Are headaches hereditary? A: Four out of five (80%) of people that suffer report a family history of migraine. Scientists are not sure if this is genetic or a family predisposition. Despite the uncertainty, a child has a 50% chance of having migraine if one parent suffers. The child has a 75% chance if both parents suffer.  Q: Can children get headaches? A: By the time they reach high school, most young people have experienced some type of headache. Many safe and effective approaches or medications can prevent a headache from occurring or stop it after it has begun.  Q: What type of doctor should I see to diagnose and treat my headache? A: Start with your primary caregiver. Discuss his or her experience and approach to headaches. Discuss methods of classification, diagnosis, and treatment. Your caregiver may decide to recommend you to a headache specialist, depending upon your symptoms or other  physical conditions. Having diabetes, allergies, etc., may require a more comprehensive and inclusive approach to your headache. The National Headache Foundation will provide, upon request, a list of [facility] [facility] physician members in your state. Document Released: 07/27/2003 Document Revised: 07/29/2011 Document Reviewed: 01/04/2008 [facility] Patient Information 2015 [place], [place]. This information is not intended to replace advice given to you by your health care provider. Make sure you discuss any questions you have with your health care provider.

## 2014-12-26 NOTE — MAU Note (Signed)
Onset of headache x 2 days has been taking Tylenol hasn't helped, seeing spots before her eyes.

## 2014-12-26 NOTE — MAU Provider Note (Signed)
History   [id number] at 15.4 wks in with c/o migraine headache for 2 days. States had this kind of headache with last pregnancy. Tylenol has not helped.  CSN: [id number]  Arrival date and time: 12/26/14 1715   None     Chief Complaint  Patient presents with  . Headache   HPI  OB History    Gravida Para Term Preterm AB TAB SAB Ectopic Multiple Living   4 1 1  0 2 1 1  0 0 1      Past Medical History  Diagnosis Date  . Asthma   . Hypertension   . Anxiety   . Pregnancy induced hypertension   . Kidney stones   . Ovarian cyst   . Vaginal Pap smear, abnormal     bx, f/u ok  . Depression     doing good, not on meds now  . Missed abortion 10/02/2013    Past Surgical History  Procedure Laterality Date  . Cesarean section    . Dilation and curettage of uterus    . Dilation and evacuation N/A 10/04/2013    Procedure: DILATATION AND EVACUATION;  Surgeon: Sherian Rein, MD;  Location: [facility];  Service: Gynecology;  Laterality: N/A;  [place] in room     Family History  Problem Relation Age of Onset  . Hearing loss Mother   . Asthma Mother   . Diabetes Son   . Cancer Maternal Grandfather     bone  . Liver disease Maternal Grandfather   . Asthma Father   . Asthma Sister     History  Substance Use Topics  . Smoking status: Current Some Day Smoker -- 0.50 packs/day for 15 years    Types: Cigarettes  . Smokeless tobacco: Never Used     Comment: Smoking .5 ppd  . Alcohol Use: No     Comment: very occassionally    Allergies: No Known Allergies  Prescriptions prior to admission  Medication Sig Dispense Refill Last Dose  . albuterol (PROVENTIL HFA;VENTOLIN HFA) 108 (90 BASE) MCG/ACT inhaler Inhale 2 puffs into the lungs every 2 (two) hours as needed for wheezing or shortness of breath (cough). 1 Inhaler 3 Taking  . amLODipine (NORVASC) 2.5 MG tablet Take 1 tablet (2.5 mg total) by mouth daily. (Patient not taking: Reported on 12/01/2014) 90 tablet 3 Not Taking  .  beclomethasone (QVAR) 80 MCG/ACT inhaler Inhale 2 puffs into the lungs 2 (two) times daily. (Patient not taking: Reported on 12/01/2014) 1 Inhaler 12 Not Taking  . FLUoxetine (PROZAC) 40 MG capsule Take 40 mg by mouth daily.   Not Taking  . Lurasidone HCl (LATUDA) 20 MG TABS Take 1 tablet by mouth at bedtime.   Not Taking  . meclizine (ANTIVERT) 25 MG tablet Take 25 mg by mouth 3 (three) times daily as needed for dizziness.   Not Taking  . Prenat w/o A Vit-FeFum-FePo-FA (CONCEPT OB) 130-92.4-1 MG CAPS Take 1 tablet by mouth daily. 30 capsule 11 Taking  . promethazine (PHENERGAN) 25 MG tablet Take 0.5-1 tablets (12.5-25 mg total) by mouth every 6 (six) hours as needed. (Patient not taking: Reported on 12/01/2014) 30 tablet 0 Not Taking  . tolterodine (DETROL LA) 4 MG 24 hr capsule Take 4 mg by mouth daily.   Not Taking  . traZODone (DESYREL) 50 MG tablet Take 50 mg by mouth at bedtime as needed for sleep.    Not Taking    Review of Systems  Constitutional: Negative.   Eyes: Positive for  blurred vision and photophobia.  Respiratory: Negative.   Cardiovascular: Negative.   Gastrointestinal: Negative.   Genitourinary: Negative.   Musculoskeletal: Negative.   Skin: Negative.   Neurological: Positive for headaches.  Endo/Heme/Allergies: Negative.   Psychiatric/Behavioral: Negative.    Physical Exam   Blood pressure 126/75, pulse 89, temperature 97.9 F (36.6 C), resp. rate 18, last menstrual period 09/02/2014, SpO2 100 %.  Physical Exam  Constitutional: She is oriented to person, place, and time. She appears well-developed and well-nourished.  HENT:  Head: Normocephalic.  Eyes: Pupils are equal, round, and reactive to light.  Neck: Normal range of motion.  Cardiovascular: Normal rate, regular rhythm, normal heart sounds and intact distal pulses.   Respiratory: Effort normal and breath sounds normal.  GI: Soft. Bowel sounds are normal.  Genitourinary:  gravid  Musculoskeletal: Normal  range of motion.  Neurological: She is alert and oriented to person, place, and time. She has normal reflexes.  Skin: Skin is warm and dry.  Psychiatric: She has a normal mood and affect. Her behavior is normal. Judgment and thought content normal.    MAU Course  Procedures  MDM migraine  Assessment and Plan  Migraine headache at 15.4 wks will manage ain and d/c home  Treyshaun Keatts DARLENE 12/26/2014, 6:12 PM

## 2015-01-11 ENCOUNTER — Encounter (HOSPITAL_COMMUNITY): Payer: Self-pay

## 2015-01-11 ENCOUNTER — Inpatient Hospital Stay (HOSPITAL_COMMUNITY)
Admission: AD | Admit: 2015-01-11 | Discharge: 2015-01-11 | Disposition: A | Discharge status: Home / Self Care | Payer: Medicaid Other | Source: Ambulatory Visit | Attending: Obstetrics | Admitting: Obstetrics

## 2015-01-11 DIAGNOSIS — O9989 Other specified diseases and conditions complicating pregnancy, childbirth and the puerperium: Secondary | ICD-10-CM | POA: Insufficient documentation

## 2015-01-11 DIAGNOSIS — G43109 Migraine with aura, not intractable, without status migrainosus: Secondary | ICD-10-CM | POA: Diagnosis not present

## 2015-01-11 DIAGNOSIS — Z3A17 17 weeks gestation of pregnancy: Secondary | ICD-10-CM | POA: Insufficient documentation

## 2015-01-11 DIAGNOSIS — R51 Headache: Secondary | ICD-10-CM | POA: Diagnosis present

## 2015-01-11 LAB — URINALYSIS, ROUTINE W REFLEX MICROSCOPIC
BILIRUBIN URINE: NEGATIVE
GLUCOSE, UA: NEGATIVE mg/dL
Hgb urine dipstick: NEGATIVE
KETONES UR: 15 mg/dL — AB
Leukocytes, UA: NEGATIVE
Nitrite: NEGATIVE
PH: 6 (ref 5.0–8.0)
Protein, ur: NEGATIVE mg/dL
Specific Gravity, Urine: >1.03 — ABNORMAL HIGH (ref 1.005–1.030)
Urobilinogen, UA: 0.2 mg/dL (ref 0.0–1.0)

## 2015-01-11 MED ORDER — METOCLOPRAMIDE HCL 5 MG/ML IJ SOLN: 10.0000 mg | Freq: Once | INTRAMUSCULAR | Status: DC

## 2015-01-11 MED ORDER — PROMETHAZINE HCL 25 MG/ML IJ SOLN
25.0000 mg | Freq: Once | INTRAMUSCULAR | Status: AC
  Administered 2015-01-11: 25 mg via INTRAVENOUS
  Filled 2015-01-11: qty 1

## 2015-01-11 MED ORDER — CYCLOBENZAPRINE HCL 10 MG PO TABS: 10.0000 mg | ORAL_TABLET | Freq: Two times a day (BID) | ORAL | Status: DC | PRN

## 2015-01-11 MED ORDER — DIPHENHYDRAMINE HCL 50 MG/ML IJ SOLN
25.0000 mg | Freq: Once | INTRAMUSCULAR | Status: AC
  Administered 2015-01-11: 25 mg via INTRAVENOUS
  Filled 2015-01-11: qty 1

## 2015-01-11 MED ORDER — LACTATED RINGERS IV BOLUS (SEPSIS)
1000.0000 mL | Freq: Once | INTRAVENOUS | Status: AC
  Administered 2015-01-11: 1000 mL via INTRAVENOUS

## 2015-01-11 NOTE — MAU Provider Note (Signed)
History     CSN: [id number]  Arrival date and time: 01/11/15 4098   First Provider Initiated Contact with Patient 01/11/15 2101      Chief Complaint  Patient presents with  . Headache   HPI [name] 34 y.o. [id number]  presents with HA that started 4 hours ago.  It is severe, located across forehead, retroorbital, bilaterally.  There is associated throbbing, worse with movement, photophobia and phonophobia.  She had nausea earlier but it is now improved.  No vomiting.  She noted spots in her visual field before the pain started.  They continued at the beginning of the headache and she has had blurriness since.  Fioricet and tylenol did not help.  Rest in dark room did not help.   She notes good fetal movement and denies vaginal bleeding, LOF, dysuria, fever, CP, SOB.  OB History    Gravida Para Term Preterm AB TAB SAB Ectopic Multiple Living   0 0 0 1      Past Medical History  Diagnosis Date  . Asthma   . Hypertension   . Anxiety   . Pregnancy induced hypertension   . Kidney stones   . Ovarian cyst   . Vaginal Pap smear, abnormal     bx, f/u ok  . Depression     doing good, not on meds now  . Missed abortion 10/02/2013    Past Surgical History  Procedure Laterality Date  . Cesarean section    . Dilation and curettage of uterus    . Dilation and evacuation N/A 10/04/2013    Procedure: DILATATION AND EVACUATION;  Surgeon: Sherian Rein, MD;  Location: [facility];  Service: Gynecology;  Laterality: N/A;  [place] in room     Family History  Problem Relation Age of Onset  . Hearing loss Mother   . Asthma Mother   . Diabetes Son   . Cancer Maternal Grandfather     bone  . Liver disease Maternal Grandfather   . Asthma Father   . Asthma Sister     Social History  Substance Use Topics  . Smoking status: Current Some Day Smoker -- 0.50 packs/day for 15 years    Types: Cigarettes  . Smokeless tobacco: Never Used     Comment: Smoking .5 ppd  .  Alcohol Use: No     Comment: very occassionally    Allergies: No Known Allergies  Prescriptions prior to admission  Medication Sig Dispense Refill Last Dose  . acetaminophen (TYLENOL) 325 MG tablet Take 650 mg by mouth every 6 (six) hours as needed for moderate pain or headache.   12/25/2014 at Unknown time  . acetaminophen (TYLENOL) 500 MG tablet Take 1,000 mg by mouth every 6 (six) hours as needed for headache.   12/26/2014 at 1600  . albuterol (PROVENTIL HFA;VENTOLIN HFA) 108 (90 BASE) MCG/ACT inhaler Inhale 2 puffs into the lungs every 2 (two) hours as needed for wheezing or shortness of breath (cough). 1 Inhaler 3 12/26/2014 at 1100  . amLODipine (NORVASC) 2.5 MG tablet Take 1 tablet (2.5 mg total) by mouth daily. (Patient not taking: Reported on 12/01/2014) 90 tablet 3 Not Taking at Unknown time  . beclomethasone (QVAR) 80 MCG/ACT inhaler Inhale 2 puffs into the lungs 2 (two) times daily. 1 Inhaler 12 12/26/2014 at Unknown time  . Prenat w/o A Vit-FeFum-FePo-FA (CONCEPT OB) 130-92.4-1 MG CAPS Take 1 tablet by mouth daily. 30 capsule 11 12/26/2014 at 1100  .  promethazine (PHENERGAN) 25 MG tablet Take 0.5-1 tablets (12.5-25 mg total) by mouth every 6 (six) hours as needed. (Patient taking differently: Take 25 mg by mouth every 6 (six) hours as needed for nausea or vomiting. ) 30 tablet 0 Past Week at Unknown time    ROS Pertinent ROS in HPI.  All other systems are negative.   Physical Exam   Blood pressure 116/77, pulse 107, temperature 98.3 F (36.8 C), temperature source Oral, resp. rate 16, last menstrual period 09/02/2014.  Physical Exam  Constitutional: She is oriented to person, place, and time. She appears well-developed and well-nourished. No distress.  HENT:  Head: Normocephalic and atraumatic.  Eyes: EOM are normal.  Neck: Normal range of motion.  Cardiovascular: Normal rate.   Respiratory: Effort normal. No respiratory distress.  Musculoskeletal: Normal range of motion.   Neurological: She is alert and oriented to person, place, and time. No cranial nerve deficit. She exhibits normal muscle tone. Coordination normal.  Skin: Skin is warm and dry.  Psychiatric: She has a normal mood and affect. Her behavior is normal.    MAU Course  Procedures  MDM Pt seen 12/26/14 and given HA cocktail for same.  Will hold off on giving additional steroids at this time. IV fluids and IV phenergan/benadryl ordered to treat migraine symptoms.   Pt meets criteria for diagnosis of Migraine with aura or classical migraine.   Report given and care turned over to Thressa Sheller, CNM.  2236: Patient has had LR bolus, phenergan and benadryl. She reports that her headache has improved.   Assessment and Plan   1. Migraine with aura and without status migrainosus, not intractable   2. [redacted] weeks gestation of pregnancy    DC home Comfort measures reviewed  2nd Trimester precautions  RX: flexeril PRN for headaches.  Return to MAU as needed FU with OB as planned  Follow-up Information    Follow up with Kathreen Cosier, MD.   Specialty:  Obstetrics and Gynecology   Why:  As scheduled   Contact information:   [address] [place] [address] [phone]       Faylene Kurtz, Scot Jun 01/11/2015, 9:02 PM

## 2015-01-11 NOTE — MAU Note (Signed)
Pt presents complaining of a headache for 4 hours. States she has tried Geophysicist/field seismologist and tylenol but nothing is working. Denies vaginal bleeding or discharge. Denies pain.

## 2015-01-11 NOTE — Discharge Instructions (Signed)

## 2015-01-17 ENCOUNTER — Ambulatory Visit (INDEPENDENT_AMBULATORY_CARE_PROVIDER_SITE_OTHER): Payer: Medicaid Other | Admitting: Neurology

## 2015-01-17 ENCOUNTER — Encounter: Payer: Self-pay | Admitting: Neurology

## 2015-01-17 VITALS — BP 110/70 | HR 94 | Resp 18 | Ht 64.0 in | Wt 190.8 lb

## 2015-01-17 DIAGNOSIS — R51 Headache: Secondary | ICD-10-CM

## 2015-01-17 DIAGNOSIS — R519 Headache, unspecified: Secondary | ICD-10-CM

## 2015-01-17 DIAGNOSIS — Z720 Tobacco use: Secondary | ICD-10-CM

## 2015-01-17 DIAGNOSIS — Z3490 Encounter for supervision of normal pregnancy, unspecified, unspecified trimester: Secondary | ICD-10-CM

## 2015-01-17 NOTE — Patient Instructions (Addendum)
1.  I would try taking magnesium oxide 400 to  daily 2.  Since these are new headaches, we will get MRI, MRA and MRV of head. [facility]  01/30/15 1:45 pm  3.  Continue to use tylenol or caffeine but limit to no more than 2 days out of the week.  I would not use the Fioricet because it easily can start causing frequent headaches 4.  Try to continue working on quitting smoking 5.  Follow up in 6 weeks.

## 2015-01-17 NOTE — Progress Notes (Signed)
NEUROLOGY CONSULTATION NOTE  [name] MRN: [id number] DOB: [birth year]  Referring provider: Dr. Gaynell Face Primary care provider: Dr. Orpah Cobb  Reason for consult:  Migraine  HISTORY OF PRESENT ILLNESS: [name] is a 34 year old right-handed female who is in her 19th week gestation of pregnancy who presents for headaches.  She is accompanied by her mother who provides some history.  Onset:  At [redacted] weeks gestation of pregnancy. Location:  Bi-frontal/retro-orbital and back of head Quality:  pounding Intensity:  10/10 (worst headache of her life) Aura:  no Prodrome:  irritability Associated symptoms:  Photophobia, some nausea.  She denies focal numbness or weakness. Duration:  3-4 hours Frequency:  Every 2 to 3 days Triggers/exacerbating factors:  none Relieving factors:  IV steroid and phenergan Activity:  Cannot function  Past abortive medication:  Fioricet (made headaches worse) Past preventative medication:  none Other past therapy:  none  Current abortive medication:  Tylenol, Flexeril (both ineffective) Antihypertensive medications:  none Antidepressant medications:  none Anticonvulsant medications:  None  She has no prior history of headache or migraines.  When she was pregnant with her first child, she had pre-eclampsia, but no headaches.  Her blood pressure has been stable for this pregnancy.  Smoker:  Current smoker Family history of headache:  Maternal aunt  PAST MEDICAL HISTORY: Past Medical History  Diagnosis Date  . Asthma   . Hypertension   . Anxiety   . Pregnancy induced hypertension   . Kidney stones   . Ovarian cyst   . Vaginal Pap smear, abnormal     bx, f/u ok  . Depression     doing good, not on meds now  . Missed abortion 10/02/2013    PAST SURGICAL HISTORY: Past Surgical History  Procedure Laterality Date  . Cesarean section    . Dilation and curettage of uterus    . Dilation and evacuation N/A 10/04/2013    Procedure:  DILATATION AND EVACUATION;  Surgeon: Sherian Rein, MD;  Location: [facility];  Service: Gynecology;  Laterality: N/A;  [place] in room     MEDICATIONS: Current Outpatient Prescriptions on File Prior to Visit  Medication Sig Dispense Refill  . acetaminophen (TYLENOL) 500 MG tablet Take 1,000 mg by mouth every 6 (six) hours as needed for headache.    . albuterol (PROVENTIL HFA;VENTOLIN HFA) 108 (90 BASE) MCG/ACT inhaler Inhale 2 puffs into the lungs every 2 (two) hours as needed for wheezing or shortness of breath (cough). 1 Inhaler 3  . beclomethasone (QVAR) 80 MCG/ACT inhaler Inhale 2 puffs into the lungs 2 (two) times daily. 1 Inhaler 12  . butalbital-acetaminophen-caffeine (FIORICET, ESGIC) 50-325-40 MG per tablet Take 1 tablet by mouth 2 (two) times daily as needed for migraine.    . cyclobenzaprine (FLEXERIL) 10 MG tablet Take 1 tablet (10 mg total) by mouth 2 (two) times daily as needed for muscle spasms. 20 tablet 0  . Prenat w/o A Vit-FeFum-FePo-FA (CONCEPT OB) 130-92.4-1 MG CAPS Take 1 tablet by mouth daily. 30 capsule 11  . promethazine (PHENERGAN) 25 MG tablet Take 0.5-1 tablets (12.5-25 mg total) by mouth every 6 (six) hours as needed. (Patient taking differently: Take 25 mg by mouth every 6 (six) hours as needed for nausea or vomiting. ) 30 tablet 0  . diphenhydrAMINE (BENADRYL) 25 MG tablet Take 25 mg by mouth every 6 (six) hours as needed for allergies (cold).     No current facility-administered medications on file prior to visit.  ALLERGIES: No Known Allergies  FAMILY HISTORY: Family History  Problem Relation Age of Onset  . Hearing loss Mother   . Asthma Mother   . Diabetes Son   . Cancer Maternal Grandfather     bone  . Liver disease Maternal Grandfather   . Asthma Father   . Asthma Sister   . Lupus Sister     SOCIAL HISTORY: Social History   Social History  . Marital Status: Single    Spouse Name: N/A  . Number of Children: N/A  . Years of Education: N/A     Occupational History  . Not on file.   Social History Main Topics  . Smoking status: Current Some Day Smoker -- 0.50 packs/day for 15 years    Types: Cigarettes  . Smokeless tobacco: Never Used     Comment: Smoking .5 ppd  . Alcohol Use: No     Comment: very occassionally  . Drug Use: No  . Sexual Activity:    Partners: Male   Other Topics Concern  . Not on file   Social History Narrative    REVIEW OF SYSTEMS: Constitutional: No fevers, chills, or sweats, no generalized fatigue, change in appetite Eyes: No visual changes, double vision, eye pain Ear, nose and throat: No hearing loss, ear pain, nasal congestion, sore throat Cardiovascular: No chest pain, palpitations Respiratory:  No shortness of breath at rest or with exertion, wheezes GastrointestinaI: No nausea, vomiting, diarrhea, abdominal pain, fecal incontinence Genitourinary:  No dysuria, urinary retention or frequency Musculoskeletal:  No neck pain, back pain Integumentary: No rash, pruritus, skin lesions Neurological: as above Psychiatric: No depression, insomnia, anxiety Endocrine: No palpitations, fatigue, diaphoresis, mood swings, change in appetite, change in weight, increased thirst Hematologic/Lymphatic:  No anemia, purpura, petechiae. Allergic/Immunologic: no itchy/runny eyes, nasal congestion, recent allergic reactions, rashes  PHYSICAL EXAM: Filed Vitals:   01/17/15 1049  BP: 110/70  Pulse: 94  Resp: 18   General: No acute distress.   Head:  Normocephalic/atraumatic Eyes:  fundi unremarkable, without vessel changes, exudates, hemorrhages or papilledema. Neck: supple, no paraspinal tenderness, full range of motion Back: No paraspinal tenderness Heart: regular rate and rhythm Lungs: Clear to auscultation bilaterally. Vascular: No carotid bruits. Neurological Exam: Mental status: alert and oriented to person, place, and time, recent and remote memory intact, fund of knowledge intact, attention  and concentration intact, speech fluent and not dysarthric, language intact. Cranial nerves: CN I: not tested CN II: pupils equal, round and reactive to light, visual fields intact, fundi unremarkable, without vessel changes, exudates, hemorrhages or papilledema. CN III, IV, VI:  full range of motion, no nystagmus, no ptosis CN V: facial sensation intact CN VII: upper and lower face symmetric CN VIII: hearing intact CN IX, X: gag intact, uvula midline CN XI: sternocleidomastoid and trapezius muscles intact CN XII: tongue midline Bulk & Tone: normal, no fasciculations. Motor:  5/5 throughout. Sensation: temperature and vibration sensation intact. Deep Tendon Reflexes:  2+ throughout, toes downgoing.  Finger to nose testing:  Without dysmetria.  Heel to shin:  Without dysmetria.  Gait:  Normal station and stride.  Able to turn and tandem walk. Romberg negative.  IMPRESSION: Probably migraines.  However, since these headaches have started during pregnancy, with no prior history of headaches, and that these are the worst headaches of her life, we need to rule out other etiologies, such as PRES, reversible cerebral vasoconstriction syndrome, or venous sinus thrombosis. Tobacco abuse  PLAN: 1.  Will check MRI, MRA and  MRV of head without contrast 2.  Will try magnesium  to  daily.   3.  Unfortunately, there aren't other abortive medications to take, other than caffeine or tylenol.  I would not take Fioricet due to high propensity for rebound headache 4.  She will follow up in 6 weeks.  Other options include cyproheptadine  twice daily. 5.  Discussed smoking cessation.  45 minutes spent face to face with patient, over 50% spent discussing diagnosis and management.  Thank you for allowing me to take part in the care of this patient.  Shon Millet, DO  CC:  Francoise Ceo, MD  Doris Cheadle, MD

## 2015-01-18 ENCOUNTER — Other Ambulatory Visit (HOSPITAL_COMMUNITY): Payer: Self-pay | Admitting: Obstetrics

## 2015-01-18 DIAGNOSIS — Z3689 Encounter for other specified antenatal screening: Secondary | ICD-10-CM

## 2015-01-18 DIAGNOSIS — Z3A20 20 weeks gestation of pregnancy: Secondary | ICD-10-CM

## 2015-01-26 ENCOUNTER — Encounter (HOSPITAL_COMMUNITY): Payer: Self-pay | Admitting: *Deleted

## 2015-01-26 ENCOUNTER — Inpatient Hospital Stay (HOSPITAL_COMMUNITY): Payer: Medicaid Other

## 2015-01-26 ENCOUNTER — Inpatient Hospital Stay (HOSPITAL_COMMUNITY)
Admission: AD | Admit: 2015-01-26 | Discharge: 2015-01-27 | Disposition: A | Discharge status: Home / Self Care | Payer: Medicaid Other | Source: Ambulatory Visit | Attending: Obstetrics | Admitting: Obstetrics

## 2015-01-26 DIAGNOSIS — M549 Dorsalgia, unspecified: Secondary | ICD-10-CM | POA: Insufficient documentation

## 2015-01-26 DIAGNOSIS — O26892 Other specified pregnancy related conditions, second trimester: Secondary | ICD-10-CM | POA: Diagnosis not present

## 2015-01-26 DIAGNOSIS — I10 Essential (primary) hypertension: Secondary | ICD-10-CM

## 2015-01-26 DIAGNOSIS — M545 Low back pain: Secondary | ICD-10-CM | POA: Diagnosis present

## 2015-01-26 DIAGNOSIS — Z3A20 20 weeks gestation of pregnancy: Secondary | ICD-10-CM

## 2015-01-26 DIAGNOSIS — O99891 Other specified diseases and conditions complicating pregnancy: Secondary | ICD-10-CM

## 2015-01-26 DIAGNOSIS — O9989 Other specified diseases and conditions complicating pregnancy, childbirth and the puerperium: Secondary | ICD-10-CM

## 2015-01-26 DIAGNOSIS — Z98891 History of uterine scar from previous surgery: Secondary | ICD-10-CM

## 2015-01-26 HISTORY — DX: Headache: R51

## 2015-01-26 HISTORY — DX: Headache, unspecified: R51.9

## 2015-01-26 LAB — URINALYSIS, ROUTINE W REFLEX MICROSCOPIC
BILIRUBIN URINE: NEGATIVE
Glucose, UA: NEGATIVE mg/dL
Hgb urine dipstick: NEGATIVE
Ketones, ur: 15 mg/dL — AB
Leukocytes, UA: NEGATIVE
NITRITE: NEGATIVE
PROTEIN: NEGATIVE mg/dL
SPECIFIC GRAVITY, URINE: 1.02 (ref 1.005–1.030)
UROBILINOGEN UA: 0.2 mg/dL (ref 0.0–1.0)
pH: 6.5 (ref 5.0–8.0)

## 2015-01-26 MED ORDER — CYCLOBENZAPRINE HCL 10 MG PO TABS
10.0000 mg | ORAL_TABLET | Freq: Once | ORAL | Status: AC
  Administered 2015-01-26: 10 mg via ORAL
  Filled 2015-01-26: qty 1
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
## 2015-01-27 DIAGNOSIS — O26892 Other specified pregnancy related conditions, second trimester: Secondary | ICD-10-CM

## 2015-01-27 DIAGNOSIS — M549 Dorsalgia, unspecified: Secondary | ICD-10-CM

## 2015-01-27 MED ORDER — ACETAMINOPHEN-CODEINE #3 300-30 MG PO TABS: 1.0000 | ORAL_TABLET | Freq: Four times a day (QID) | ORAL | Status: DC | PRN

## 2015-01-27 MED ORDER — ACETAMINOPHEN-CODEINE #3 300-30 MG PO TABS
1.0000 | ORAL_TABLET | Freq: Once | ORAL | Status: AC
  Administered 2015-01-27: 1 via ORAL
  Filled 2015-01-27: qty 1

## 2015-01-27 NOTE — Discharge Instructions (Signed)

## 2015-01-30 ENCOUNTER — Ambulatory Visit (HOSPITAL_COMMUNITY): Payer: Medicaid Other

## 2015-01-30 ENCOUNTER — Ambulatory Visit (HOSPITAL_COMMUNITY): Admission: RE | Admit: 2015-01-30 | Payer: Medicaid Other | Source: Ambulatory Visit

## 2015-01-30 ENCOUNTER — Ambulatory Visit (HOSPITAL_COMMUNITY)
Admission: RE | Admit: 2015-01-30 | Discharge: 2015-01-30 | Disposition: A | Discharge status: Home / Self Care | Payer: Medicaid Other | Source: Ambulatory Visit | Attending: Neurology | Admitting: Neurology

## 2015-01-30 ENCOUNTER — Other Ambulatory Visit (HOSPITAL_COMMUNITY): Payer: Medicaid Other

## 2015-01-30 DIAGNOSIS — R110 Nausea: Secondary | ICD-10-CM | POA: Diagnosis not present

## 2015-01-30 DIAGNOSIS — O9989 Other specified diseases and conditions complicating pregnancy, childbirth and the puerperium: Secondary | ICD-10-CM | POA: Diagnosis not present

## 2015-01-30 DIAGNOSIS — R51 Headache: Secondary | ICD-10-CM | POA: Diagnosis not present

## 2015-01-30 DIAGNOSIS — Z720 Tobacco use: Secondary | ICD-10-CM

## 2015-01-30 DIAGNOSIS — R519 Headache, unspecified: Secondary | ICD-10-CM

## 2015-01-30 DIAGNOSIS — Z3A00 Weeks of gestation of pregnancy not specified: Secondary | ICD-10-CM | POA: Insufficient documentation

## 2015-01-30 DIAGNOSIS — H53149 Visual discomfort, unspecified: Secondary | ICD-10-CM | POA: Diagnosis not present

## 2015-01-30 DIAGNOSIS — Z3490 Encounter for supervision of normal pregnancy, unspecified, unspecified trimester: Secondary | ICD-10-CM

## 2015-01-31 ENCOUNTER — Telehealth: Payer: Self-pay

## 2015-01-31 NOTE — Telephone Encounter (Signed)
-----   Message from Drema Dallas, DO sent at 01/31/2015  7:37 AM EDT ----- MRI/MRA and MRV of the head all look okay.

## 2015-01-31 NOTE — Telephone Encounter (Signed)
Spoke with pt. Pt. Advised of results and verbalized understanding. Will call back with any questions or concerns. [facility]

## 2015-02-01 ENCOUNTER — Ambulatory Visit (HOSPITAL_COMMUNITY)
Admission: RE | Admit: 2015-02-01 | Discharge: 2015-02-01 | Disposition: A | Discharge status: Home / Self Care | Payer: Medicaid Other | Source: Ambulatory Visit | Attending: Obstetrics | Admitting: Obstetrics

## 2015-02-01 ENCOUNTER — Other Ambulatory Visit (HOSPITAL_COMMUNITY): Payer: Self-pay | Admitting: Obstetrics

## 2015-02-01 DIAGNOSIS — O99332 Smoking (tobacco) complicating pregnancy, second trimester: Secondary | ICD-10-CM | POA: Insufficient documentation

## 2015-02-01 DIAGNOSIS — Z3A20 20 weeks gestation of pregnancy: Secondary | ICD-10-CM

## 2015-02-01 DIAGNOSIS — Z3689 Encounter for other specified antenatal screening: Secondary | ICD-10-CM

## 2015-02-01 DIAGNOSIS — O3421 Maternal care for scar from previous cesarean delivery: Secondary | ICD-10-CM | POA: Diagnosis not present

## 2015-02-01 DIAGNOSIS — O10019 Pre-existing essential hypertension complicating pregnancy, unspecified trimester: Secondary | ICD-10-CM

## 2015-02-01 DIAGNOSIS — Z36 Encounter for antenatal screening of mother: Secondary | ICD-10-CM | POA: Insufficient documentation

## 2015-02-01 DIAGNOSIS — O34219 Maternal care for unspecified type scar from previous cesarean delivery: Secondary | ICD-10-CM

## 2015-02-09 ENCOUNTER — Other Ambulatory Visit: Payer: Self-pay | Admitting: Internal Medicine

## 2015-02-24 ENCOUNTER — Other Ambulatory Visit: Payer: Self-pay | Admitting: Internal Medicine

## 2015-03-02 ENCOUNTER — Ambulatory Visit: Payer: Medicaid Other | Admitting: Neurology

## 2015-03-31 ENCOUNTER — Encounter (HOSPITAL_COMMUNITY): Payer: Self-pay | Admitting: *Deleted

## 2015-03-31 ENCOUNTER — Inpatient Hospital Stay (HOSPITAL_COMMUNITY)
Admission: AD | Admit: 2015-03-31 | Discharge: 2015-03-31 | Disposition: A | Discharge status: Home / Self Care | Payer: Medicaid Other | Source: Ambulatory Visit | Attending: Obstetrics | Admitting: Obstetrics

## 2015-03-31 DIAGNOSIS — Z3A29 29 weeks gestation of pregnancy: Secondary | ICD-10-CM | POA: Diagnosis not present

## 2015-03-31 DIAGNOSIS — I10 Essential (primary) hypertension: Secondary | ICD-10-CM

## 2015-03-31 DIAGNOSIS — O10913 Unspecified pre-existing hypertension complicating pregnancy, third trimester: Secondary | ICD-10-CM | POA: Diagnosis not present

## 2015-03-31 DIAGNOSIS — F17200 Nicotine dependence, unspecified, uncomplicated: Secondary | ICD-10-CM

## 2015-03-31 DIAGNOSIS — O26893 Other specified pregnancy related conditions, third trimester: Secondary | ICD-10-CM | POA: Diagnosis not present

## 2015-03-31 DIAGNOSIS — O99613 Diseases of the digestive system complicating pregnancy, third trimester: Secondary | ICD-10-CM | POA: Insufficient documentation

## 2015-03-31 DIAGNOSIS — R197 Diarrhea, unspecified: Secondary | ICD-10-CM | POA: Diagnosis not present

## 2015-03-31 DIAGNOSIS — A084 Viral intestinal infection, unspecified: Secondary | ICD-10-CM | POA: Diagnosis not present

## 2015-03-31 DIAGNOSIS — R110 Nausea: Secondary | ICD-10-CM | POA: Insufficient documentation

## 2015-03-31 DIAGNOSIS — R202 Paresthesia of skin: Secondary | ICD-10-CM | POA: Diagnosis present

## 2015-03-31 LAB — PROTEIN / CREATININE RATIO, URINE
CREATININE, URINE: 65 mg/dL
PROTEIN CREATININE RATIO: 0.14 mg/mg{creat} (ref 0.00–0.15)
Total Protein, Urine: 9 mg/dL

## 2015-03-31 LAB — URINALYSIS, ROUTINE W REFLEX MICROSCOPIC
BILIRUBIN URINE: NEGATIVE
GLUCOSE, UA: NEGATIVE mg/dL
HGB URINE DIPSTICK: NEGATIVE
Ketones, ur: NEGATIVE mg/dL
LEUKOCYTES UA: NEGATIVE
NITRITE: NEGATIVE
PROTEIN: NEGATIVE mg/dL
UROBILINOGEN UA: 0.2 mg/dL (ref 0.0–1.0)
pH: 6.5 (ref 5.0–8.0)

## 2015-03-31 LAB — CBC
HCT: 36.4 % (ref 36.0–46.0)
HEMOGLOBIN: 12 g/dL (ref 12.0–15.0)
MCH: 30.2 pg (ref 26.0–34.0)
MCHC: 33 g/dL (ref 30.0–36.0)
MCV: 91.7 fL (ref 78.0–100.0)
Platelets: 241 10*3/uL (ref 150–400)
RBC: 3.97 MIL/uL (ref 3.87–5.11)
RDW: 13.6 % (ref 11.5–15.5)
WBC: 14.3 10*3/uL — AB (ref 4.0–10.5)

## 2015-03-31 LAB — COMPREHENSIVE METABOLIC PANEL
ALT: 18 U/L (ref 14–54)
ANION GAP: 8 (ref 5–15)
AST: 25 U/L (ref 15–41)
Albumin: 3.3 g/dL — ABNORMAL LOW (ref 3.5–5.0)
Alkaline Phosphatase: 108 U/L (ref 38–126)
BUN: 6 mg/dL (ref 6–20)
CHLORIDE: 105 mmol/L (ref 101–111)
CO2: 24 mmol/L (ref 22–32)
Calcium: 9.4 mg/dL (ref 8.9–10.3)
Creatinine, Ser: 0.77 mg/dL (ref 0.44–1.00)
Glucose, Bld: 67 mg/dL (ref 65–99)
Potassium: 3.6 mmol/L (ref 3.5–5.1)
SODIUM: 137 mmol/L (ref 135–145)
Total Bilirubin: 0.5 mg/dL (ref 0.3–1.2)
Total Protein: 7.2 g/dL (ref 6.5–8.1)

## 2015-03-31 MED ORDER — PROMETHAZINE HCL 25 MG PO TABS
12.5000 mg | ORAL_TABLET | Freq: Once | ORAL | Status: AC
  Administered 2015-03-31: 12.5 mg via ORAL
  Filled 2015-03-31: qty 1

## 2015-03-31 NOTE — MAU Note (Signed)
started feeling bad last night. Vomiting and diarrhea (x2) started after she ate this morning. Some abd cramping, body aches, denies fever

## 2015-03-31 NOTE — Discharge Instructions (Signed)

## 2015-03-31 NOTE — MAU Provider Note (Signed)
History     CSN: [id number] Arrival date and time: 03/31/15 1350 First Provider Initiated Contact with Patient 03/31/15 1430      Chief Complaint  Patient presents with  . Nausea  . Diarrhea   HPI Patient is 34 y.o. [id number] [redacted]w[redacted]d here with complaints of tingling, nausea, vomitting.   #Nausea/Diarrhea: started after breakfast. Reports 2 loose bowel movements, green in color. Nausea is worsening, may have started last night. No blood or mucous in the diarrhea. No sick contracts, travel. No new foods. Denies fevers, sweats. +chills.   #Tingling: in all extremities. Reports colored spots in her vision. No HA. At MD visit she was 179/100.  She does have a history of cHTN and was previously treated with amlodipine. Denies other neuro sx.   +FM, denies LOF, VB, contractions, vaginal discharge.   Prior Preeclampsia in prior pregnancy  OB History    Gravida Para Term Preterm AB TAB SAB Ectopic Multiple Living   0 0 0 1      Past Medical History  Diagnosis Date  . Asthma   . Hypertension   . Anxiety   . Pregnancy induced hypertension   . Kidney stones   . Ovarian cyst   . Vaginal Pap smear, abnormal     bx, f/u ok  . Depression     doing good, not on meds now  . Missed abortion 10/02/2013  . Headache     migraines    Past Surgical History  Procedure Laterality Date  . Cesarean section    . Dilation and curettage of uterus    . Dilation and evacuation N/A 10/04/2013    Procedure: DILATATION AND EVACUATION;  Surgeon: Sherian Rein, MD;  Location: [facility];  Service: Gynecology;  Laterality: N/A;  [place] in room     Family History  Problem Relation Age of Onset  . Hearing loss Mother   . Asthma Mother   . Diabetes Son   . Cancer Maternal Grandfather     bone  . Liver disease Maternal Grandfather   . Asthma Father   . Asthma Sister   . Lupus Sister     Social History  Substance Use Topics  . Smoking status: Current Some Day Smoker -- 0.50 packs/day  for 15 years    Types: Cigarettes  . Smokeless tobacco: Never Used     Comment: Smoking .5 ppd  . Alcohol Use: No     Comment: very occassionally    Allergies: No Known Allergies  Prescriptions prior to admission  Medication Sig Dispense Refill Last Dose  . acetaminophen (TYLENOL) 500 MG tablet Take 1,000 mg by mouth every 6 (six) hours as needed for headache.   Taking  . acetaminophen-codeine (TYLENOL #3) 300-30 MG per tablet Take 1-2 tablets by mouth every 6 (six) hours as needed for moderate pain. 15 tablet 0   . beclomethasone (QVAR) 80 MCG/ACT inhaler Inhale 2 puffs into the lungs 2 (two) times daily. 1 Inhaler 12 Taking  . butalbital-acetaminophen-caffeine (FIORICET, ESGIC) 50-325-40 MG per tablet Take 1 tablet by mouth 2 (two) times daily as needed for migraine.   Taking  . cyclobenzaprine (FLEXERIL) 10 MG tablet Take 1 tablet (10 mg total) by mouth 2 (two) times daily as needed for muscle spasms. 20 tablet 0 Taking  . diphenhydrAMINE (BENADRYL) 25 MG tablet Take 25 mg by mouth every 6 (six) hours as needed for allergies (cold).   Not Taking  . LATUDA 20 MG  TABS TAKE 1 TABLET EVERY DAY AFTER A MEAL  1 Not Taking  . Prenat w/o A Vit-FeFum-FePo-FA (CONCEPT OB) 130-92.4-1 MG CAPS Take 1 tablet by mouth daily. 30 capsule 11 Taking  . Prenat-FeCbn-FeAsp-Meth-FA-DHA (PRENATE MINI) 18-0.6-0.4-350 MG CAPS Take 1 capsule by mouth daily.  6 Taking  . PROAIR HFA 108 (90 BASE) MCG/ACT inhaler USE 2 PUFFS EVERY 2 HOURS AS NEEDED FOR WHEEZING OR SHORTNESS OF BREATH 8.5 Inhaler 3   . promethazine (PHENERGAN) 25 MG tablet Take 0.5-1 tablets (12.5-25 mg total) by mouth every 6 (six) hours as needed. (Patient taking differently: Take 25 mg by mouth every 6 (six) hours as needed for nausea or vomiting. ) 30 tablet 0 Taking  . tolterodine (DETROL LA) 4 MG 24 hr capsule Take 4 mg by mouth daily.  11 Not Taking    Review of Systems  Constitutional: Positive for chills. Negative for fever.  Eyes:  Negative for blurred vision and double vision.  Respiratory: Negative for cough and shortness of breath.   Cardiovascular: Negative for chest pain and orthopnea.  Gastrointestinal: Positive for nausea and diarrhea. Negative for vomiting.  Genitourinary: Negative for dysuria, frequency and flank pain.  Musculoskeletal: Negative for myalgias.  Skin: Negative for rash.  Neurological: Negative for dizziness, tingling, weakness and headaches.  Endo/Heme/Allergies: Does not bruise/bleed easily.  Psychiatric/Behavioral: Negative for depression and suicidal ideas. The patient is not nervous/anxious.    Physical Exam   Blood pressure 143/89, pulse 97, temperature 99.5 F (37.5 C), temperature source Oral, resp. rate 18, last menstrual period 09/02/2014.  Physical Exam  Nursing note and vitals reviewed. Constitutional: She is oriented to person, place, and time. She appears well-developed and well-nourished. No distress.  Pregnant female  HENT:  Head: Normocephalic and atraumatic.  Eyes: Conjunctivae are normal. No scleral icterus.  Neck: Normal range of motion. Neck supple.  Cardiovascular: Normal rate and intact distal pulses.   Respiratory: Effort normal. She exhibits no tenderness.  GI: Soft. There is tenderness (RUQ and bilateral lower abdomen). There is no rebound and no guarding.  Gravid  Genitourinary: Vagina normal.  Musculoskeletal: Normal range of motion. She exhibits no edema.  Neurological: She is alert and oriented to person, place, and time. No cranial nerve deficit.  1 beat clonus and 2+ reflexes throughout.  Paresthesias in bilateral extremities.   Skin: Skin is warm and dry. No rash noted.  No rash or petechiae  Psychiatric: She has a normal mood and affect.    MAU Course  Procedures  MDM- most likely gastroenteritis. Concern for Preeclampsia given elevated BP and history  UA - negative for infectious signs. No protein in urine CMP- wnl CBC- normal platlets UPC-  0.14    Assessment and Plan   [name] is a 34 y.o. [phone] at 7064w1d presenting with nausea and diarrhea   #Viral gastroenteritis - most likely dx given Nausea/Diarrhea and as labs r/o HELLP or Prex - Encouraged hydration - Recommended use of phenergan  #cHTN: no lab changes consistent with development of Preeclampsia.  -continue to monitor blood pressure in outpatient setting -reviewed warning s/sx of preeclampsia  Federico FlakeKimberly Niles Katina Remick 03/31/2015, 2:31 PM

## 2015-04-07 ENCOUNTER — Other Ambulatory Visit: Payer: Self-pay

## 2015-04-07 MED ORDER — BECLOMETHASONE DIPROPIONATE 80 MCG/ACT IN AERS: 2.0000 | INHALATION_SPRAY | Freq: Two times a day (BID) | RESPIRATORY_TRACT | Status: DC

## 2015-04-14 ENCOUNTER — Inpatient Hospital Stay (HOSPITAL_COMMUNITY): Payer: Medicaid Other

## 2015-04-14 ENCOUNTER — Encounter (HOSPITAL_COMMUNITY): Payer: Self-pay | Admitting: *Deleted

## 2015-04-14 ENCOUNTER — Inpatient Hospital Stay (HOSPITAL_COMMUNITY)
Admission: AD | Admit: 2015-04-14 | Discharge: 2015-04-14 | Disposition: A | Discharge status: Home / Self Care | Payer: Medicaid Other | Source: Ambulatory Visit | Attending: Obstetrics | Admitting: Obstetrics

## 2015-04-14 ENCOUNTER — Telehealth: Payer: Self-pay | Admitting: Internal Medicine

## 2015-04-14 DIAGNOSIS — O99519 Diseases of the respiratory system complicating pregnancy, unspecified trimester: Secondary | ICD-10-CM

## 2015-04-14 DIAGNOSIS — J45909 Unspecified asthma, uncomplicated: Secondary | ICD-10-CM | POA: Diagnosis not present

## 2015-04-14 DIAGNOSIS — R0602 Shortness of breath: Secondary | ICD-10-CM

## 2015-04-14 DIAGNOSIS — I10 Essential (primary) hypertension: Secondary | ICD-10-CM | POA: Insufficient documentation

## 2015-04-14 DIAGNOSIS — O26893 Other specified pregnancy related conditions, third trimester: Secondary | ICD-10-CM | POA: Insufficient documentation

## 2015-04-14 DIAGNOSIS — Z3A31 31 weeks gestation of pregnancy: Secondary | ICD-10-CM | POA: Diagnosis not present

## 2015-04-14 DIAGNOSIS — O9989 Other specified diseases and conditions complicating pregnancy, childbirth and the puerperium: Secondary | ICD-10-CM

## 2015-04-14 DIAGNOSIS — O99513 Diseases of the respiratory system complicating pregnancy, third trimester: Secondary | ICD-10-CM | POA: Insufficient documentation

## 2015-04-14 DIAGNOSIS — J4531 Mild persistent asthma with (acute) exacerbation: Secondary | ICD-10-CM | POA: Insufficient documentation

## 2015-04-14 DIAGNOSIS — R079 Chest pain, unspecified: Secondary | ICD-10-CM | POA: Diagnosis not present

## 2015-04-14 LAB — COMPREHENSIVE METABOLIC PANEL
ALBUMIN: 2.8 g/dL — AB (ref 3.5–5.0)
ALT: 16 U/L (ref 14–54)
ANION GAP: 9 (ref 5–15)
AST: 23 U/L (ref 15–41)
Alkaline Phosphatase: 103 U/L (ref 38–126)
BUN: 8 mg/dL (ref 6–20)
CO2: 23 mmol/L (ref 22–32)
Calcium: 10.4 mg/dL — ABNORMAL HIGH (ref 8.9–10.3)
Chloride: 102 mmol/L (ref 101–111)
Creatinine, Ser: 0.76 mg/dL (ref 0.44–1.00)
GFR calc Af Amer: >60 mL/min (ref >60)
GFR calc non Af Amer: >60 mL/min (ref >60)
GLUCOSE: 119 mg/dL — AB (ref 65–99)
POTASSIUM: 3.6 mmol/L (ref 3.5–5.1)
SODIUM: 134 mmol/L — AB (ref 135–145)
Total Bilirubin: 0.7 mg/dL (ref 0.3–1.2)
Total Protein: 6.2 g/dL — ABNORMAL LOW (ref 6.5–8.1)

## 2015-04-14 LAB — CBC
HCT: 33.8 % — ABNORMAL LOW (ref 36.0–46.0)
HEMOGLOBIN: 11.2 g/dL — AB (ref 12.0–15.0)
MCH: 30.3 pg (ref 26.0–34.0)
MCHC: 33.1 g/dL (ref 30.0–36.0)
MCV: 91.4 fL (ref 78.0–100.0)
Platelets: 239 10*3/uL (ref 150–400)
RBC: 3.7 MIL/uL — ABNORMAL LOW (ref 3.87–5.11)
RDW: 13.6 % (ref 11.5–15.5)
WBC: 12.7 10*3/uL — ABNORMAL HIGH (ref 4.0–10.5)

## 2015-04-14 LAB — D-DIMER, QUANTITATIVE: D-Dimer, Quant: 1.08 ug/mL-FEU — ABNORMAL HIGH (ref 0.00–0.50)

## 2015-04-14 MED ORDER — IPRATROPIUM-ALBUTEROL 0.5-2.5 (3) MG/3ML IN SOLN
3.0000 mL | Freq: Once | RESPIRATORY_TRACT | Status: AC
  Administered 2015-04-14: 3 mL via RESPIRATORY_TRACT
  Filled 2015-04-14: qty 3

## 2015-04-14 MED ORDER — PREDNISONE 20 MG PO TABS: 40.0000 mg | ORAL_TABLET | Freq: Every day | ORAL | Status: DC

## 2015-04-14 MED ORDER — OXYCODONE-ACETAMINOPHEN 5-325 MG PO TABS: 1.0000 | ORAL_TABLET | Freq: Four times a day (QID) | ORAL | Status: DC | PRN

## 2015-04-14 MED ORDER — GI COCKTAIL ~~LOC~~
30.0000 mL | ORAL | Status: AC
  Administered 2015-04-14: 30 mL via ORAL
  Filled 2015-04-14: qty 30

## 2015-04-14 MED ORDER — BUDESONIDE 180 MCG/ACT IN AEPB: 1.0000 | INHALATION_SPRAY | Freq: Two times a day (BID) | RESPIRATORY_TRACT | Status: DC

## 2015-04-14 MED ORDER — IPRATROPIUM BROMIDE 0.02 % IN SOLN: 0.5000 mg | Freq: Once | RESPIRATORY_TRACT | Status: DC

## 2015-04-14 MED ORDER — IOHEXOL 350 MG/ML SOLN
100.0000 mL | Freq: Once | INTRAVENOUS | Status: AC | PRN
  Administered 2015-04-14: 100 mL via INTRAVENOUS

## 2015-04-14 MED ORDER — ALBUTEROL SULFATE (2.5 MG/3ML) 0.083% IN NEBU: 2.5000 mg | INHALATION_SOLUTION | Freq: Once | RESPIRATORY_TRACT | Status: DC

## 2015-04-14 MED ORDER — OXYCODONE-ACETAMINOPHEN 5-325 MG PO TABS
2.0000 | ORAL_TABLET | Freq: Once | ORAL | Status: AC
  Administered 2015-04-14: 2 via ORAL
  Filled 2015-04-14: qty 2

## 2015-04-14 NOTE — MAU Note (Signed)
Pt presents to MAU with complaints of pain in her chest that started 30 minutes ago. Denies any other pain at this time.

## 2015-04-14 NOTE — MAU Note (Signed)
Urine in lab 

## 2015-04-14 NOTE — Telephone Encounter (Signed)
Dr Isaias SakaiLisa Leftwitch-Kirby - wants this patient seen in 6 months post delivery for mosai attenuation on CT. Has hx of asthma. Give fu

## 2015-04-14 NOTE — Discharge Instructions (Signed)
Asthma Attack Prevention °While you may not be able to control the fact that you have asthma, you can take actions to prevent asthma attacks. The best way to prevent asthma attacks is to maintain good control of your asthma. You can achieve this by: °· Taking your medicines as directed. °· Avoiding things that can irritate your airways or make your asthma symptoms worse (asthma triggers). °· Keeping track of how well your asthma is controlled and of any changes in your symptoms. °· Responding quickly to worsening asthma symptoms (asthma attack). °· Seeking emergency care when it is needed. °WHAT ARE SOME WAYS TO PREVENT AN ASTHMA ATTACK? °Have a Plan °Work with your health care provider to create a written plan for managing and treating your asthma attacks (asthma action plan). This plan includes: °· A list of your asthma triggers and how you can avoid them. °· Information on when medicines should be taken and when their dosages should be changed. °· The use of a device that measures how well your lungs are working (peak flow meter). °Monitor Your Asthma °Use your peak flow meter and record your results in a journal every day. A drop in your peak flow numbers on one or more days may indicate the start of an asthma attack. This can happen even before you start to feel symptoms. You can prevent an asthma attack from getting worse by following the steps in your asthma action plan. °Avoid Asthma Triggers °Work with your asthma health care provider to find out what your asthma triggers are. This can be done by: °· Allergy testing. °· Keeping a journal that notes when asthma attacks occur and the factors that may have contributed to them. °· Determining if there are other medical conditions that are making your asthma worse. °Once you have determined your asthma triggers, take steps to avoid them. This may include avoiding excessive or prolonged exposure to: °· Dust. Have someone dust and vacuum your home for you once or  twice a week. Using a high-efficiency particulate arrestance (HEPA) vacuum is best. °· Smoke. This includes campfire smoke, forest fire smoke, and secondhand smoke from tobacco products. °· Pet dander. Avoid contact with animals that you know you are allergic to. °· Allergens from trees, grasses or pollens. Avoid spending a lot of time outdoors when pollen counts are high, and on very windy days. °· Very cold, dry, or humid air. °· Mold. °· Foods that contain high amounts of sulfites. °· Strong odors. °· Outdoor air pollutants, such as engine exhaust. °· Indoor air pollutants, such as aerosol sprays and fumes from household cleaners. °· Household pests, including dust mites and cockroaches, and pest droppings. °· Certain medicines, including NSAIDs. Always talk to your health care provider before stopping or starting any new medicines. °Medicines °Take over-the-counter and prescription medicines only as told by your health care provider. Many asthma attacks can be prevented by carefully following your medicine schedule. Taking your medicines correctly is especially important when you cannot avoid certain asthma triggers. °Act Quickly °If an asthma attack does happen, acting quickly can decrease how severe it is and how long it lasts. Take these steps:  °· Pay attention to your symptoms. If you are coughing, wheezing, or having difficulty breathing, do not wait to see if your symptoms go away on their own. Follow your asthma action plan. °· If you have followed your asthma action plan and your symptoms are not improving, call your health care provider or seek immediate medical care   at the nearest hospital. °It is important to note how often you need to use your fast-acting rescue inhaler. If you are using your rescue inhaler more often, it may mean that your asthma is not under control. Adjusting your asthma treatment plan may help you to prevent future asthma attacks and help you to gain better control of your  condition. °HOW CAN I PREVENT AN ASTHMA ATTACK WHEN I EXERCISE? °Follow advice from your health care provider about whether you should use your fast-acting inhaler before exercising. Many people with asthma experience exercise-induced bronchoconstriction (EIB). This condition often worsens during vigorous exercise in cold, humid, or dry environments. Usually, people with EIB can stay very active by pre-treating with a fast-acting inhaler before exercising. °  °This information is not intended to replace advice given to you by your health care provider. Make sure you discuss any questions you have with your health care provider. °  °Document Released: 04/24/2009 Document Revised: 01/25/2015 Document Reviewed: 10/06/2014 °Elsevier Interactive Patient Education ©2016 [employer] ° °Asthma, Acute Bronchospasm °Acute bronchospasm caused by asthma is also referred to as an asthma attack. Bronchospasm means your air passages become narrowed. The narrowing is caused by inflammation and tightening of the muscles in the air tubes (bronchi) in your lungs. This can make it hard to breathe or cause you to wheeze and cough. °CAUSES °Possible triggers are: °· Animal dander from the skin, hair, or feathers of animals. °· Dust mites contained in house dust. °· Cockroaches. °· Pollen from trees or grass. °· Mold. °· Cigarette or tobacco smoke. °· Air pollutants such as dust, household cleaners, hair sprays, aerosol sprays, paint fumes, strong chemicals, or strong odors. °· Cold air or weather changes. Cold air may trigger inflammation. Winds increase molds and pollens in the air. °· Strong emotions such as crying or laughing hard. °· Stress. °· Certain medicines such as aspirin or beta-blockers. °· Sulfites in foods and drinks, such as dried fruits and wine. °· Infections or inflammatory conditions, such as a flu, cold, or inflammation of the nasal membranes (rhinitis). °· Gastroesophageal reflux disease (GERD). GERD is a condition  where stomach acid backs up into your esophagus. °· Exercise or strenuous activity. °SIGNS AND SYMPTOMS  °· Wheezing. °· Excessive coughing, particularly at night. °· Chest tightness. °· Shortness of breath. °DIAGNOSIS  °Your health care provider will ask you about your medical history and perform a physical exam. A chest X-ray or blood testing may be performed to look for other causes of your symptoms or other conditions that may have triggered your asthma attack.  °TREATMENT  °Treatment is aimed at reducing inflammation and opening up the airways in your lungs.  Most asthma attacks are treated with inhaled medicines. These include quick relief or rescue medicines (such as bronchodilators) and controller medicines (such as inhaled corticosteroids). These medicines are sometimes given through an inhaler or a nebulizer. Systemic steroid medicine taken by mouth or given through an IV tube also can be used to reduce the inflammation when an attack is moderate or severe. Antibiotic medicines are only used if a bacterial infection is present.  °HOME CARE INSTRUCTIONS  °· Rest. °· Drink plenty of liquids. This helps the mucus to remain thin and be easily coughed up. Only use caffeine in moderation and do not use alcohol until you have recovered from your illness. °· Do not smoke. Avoid being exposed to secondhand smoke. °· You play a critical role in keeping yourself in good health. Avoid exposure to things that   cause you to wheeze or to have breathing problems. °· Keep your medicines up-to-date and available. Carefully follow your health care provider's treatment plan. °· Take your medicine exactly as prescribed. °· When pollen or pollution is bad, keep windows closed and use an air conditioner or go to places with air conditioning. °· Asthma requires careful medical care. See your health care provider for a follow-up as advised. If you are more than [redacted] weeks pregnant and you were prescribed any new medicines, let your  obstetrician know about the visit and how you are doing. Follow up with your health care provider as directed. °· After you have recovered from your asthma attack, make an appointment with your outpatient doctor to talk about ways to reduce the likelihood of future attacks. If you do not have a doctor who manages your asthma, make an appointment with a primary care doctor to discuss your asthma. °SEEK IMMEDIATE MEDICAL CARE IF:  °· You are getting worse. °· You have trouble breathing. If severe, call your local emergency services (911 in the [place] °· You develop chest pain or discomfort. °· You are vomiting. °· You are not able to keep fluids down. °· You are coughing up yellow, green, brown, or bloody sputum. °· You have a fever and your symptoms suddenly get worse. °· You have trouble swallowing. °MAKE SURE YOU:  °· Understand these instructions. °· Will watch your condition. °· Will get help right away if you are not doing well or get worse. °  °This information is not intended to replace advice given to you by your health care provider. Make sure you discuss any questions you have with your health care provider. °  °Document Released: 08/21/2006 Document Revised: 05/11/2013 Document Reviewed: 11/11/2012 °Elsevier Interactive Patient Education ©2016 [employer] Inc[employer] ° °

## 2015-04-14 NOTE — MAU Provider Note (Signed)
Chief Complaint:  Chest Pain   None     HPI: [name] is a 34 y.o. [phone] at 31w1dwho presents to maternity admissions reporting shortness of breath with exertion starting 2-3 days ago and onset of sharp left upper chest pain today, worsening in last couple of hours.  She reports the pain increases some with deep breaths.  She has not tried anything for her pain. Her pregnancy so far has been uncomplicated. She sees Dr Gaynell Face for her prenatal care.  She reports a history of asthma and is using her rescue inhaler several times per day related to her recent shortness of breath. She denies recent respiratory infection or ill contacts. She reports good fetal movement, denies LOF, vaginal bleeding, vaginal itching/burning, urinary symptoms, h/a, dizziness, n/v, or fever/chills.    Chest Pain  This is a recurrent problem. The current episode started 1 to 4 weeks ago. The onset quality is sudden. The problem occurs intermittently. The problem has been rapidly worsening. The pain is present in the lateral region. The pain is moderate. The quality of the pain is described as sharp. The pain does not radiate. Associated symptoms include shortness of breath. Pertinent negatives include no abdominal pain, cough, dizziness, fever, headaches, irregular heartbeat, leg pain, malaise/fatigue, nausea, palpitations, sputum production, syncope, vomiting or weakness. The pain is aggravated by breathing and coughing. She has tried nothing for the symptoms.  Her past medical history is significant for anxiety/panic attacks and hypertension. Past medical history comments: HTN prepregnancy    Past Medical History: Past Medical History  Diagnosis Date  . Asthma   . Hypertension   . Anxiety   . Pregnancy induced hypertension   . Kidney stones   . Ovarian cyst   . Vaginal Pap smear, abnormal     bx, f/u ok  . Depression     doing good, not on meds now  . Missed abortion 10/02/2013  . Headache      migraines    Past obstetric history: OB History  Gravida Para Term Preterm AB SAB TAB Ectopic Multiple Living  4 1 1  0 2 1 1  0 0 1    # Outcome Date GA Lbr Len/2nd Weight Sex Delivery Anes PTL Lv  4 Current           3 TAB           2 SAB           1 Term     M CS-Unspec  N Y     Comments: failed induction, BP elevated      Past Surgical History: Past Surgical History  Procedure Laterality Date  . Cesarean section    . Dilation and curettage of uterus    . Dilation and evacuation N/A 10/04/2013    Procedure: DILATATION AND EVACUATION;  Surgeon: Sherian Rein, MD;  Location: [facility];  Service: Gynecology;  Laterality: N/A;  [place] in room     Family History: Family History  Problem Relation Age of Onset  . Hearing loss Mother   . Asthma Mother   . Diabetes Son   . Cancer Maternal Grandfather     bone  . Liver disease Maternal Grandfather   . Asthma Father   . Asthma Sister   . Lupus Sister     Social History: Social History  Substance Use Topics  . Smoking status: Current Some Day Smoker -- 0.50 packs/day for 15 years    Types: Cigarettes  . Smokeless tobacco:  Never Used     Comment: Smoking .5 ppd  . Alcohol Use: No     Comment: very occassionally    Allergies: No Known Allergies  Meds:  No prescriptions prior to admission    ROS:  Review of Systems  Constitutional: Negative for fever, chills, malaise/fatigue and fatigue.  HENT: Negative for sinus pressure.   Eyes: Negative for photophobia.  Respiratory: Positive for shortness of breath. Negative for cough and sputum production.   Cardiovascular: Positive for chest pain. Negative for palpitations and syncope.  Gastrointestinal: Negative for nausea, vomiting, abdominal pain, diarrhea and constipation.  Genitourinary: Negative for dysuria, frequency, flank pain, vaginal bleeding, vaginal discharge, difficulty urinating, vaginal pain and pelvic pain.  Musculoskeletal: Negative for neck pain.   Neurological: Negative for dizziness, weakness and headaches.  Psychiatric/Behavioral: Negative.      I have reviewed patient's Past Medical Hx, Surgical Hx, Family Hx, Social Hx, medications and allergies.   Physical Exam   Patient Vitals for the past 24 hrs:  BP Temp Pulse Resp SpO2  04/14/15 1827 132/88 mmHg - 80 18 -  04/14/15 1811 - - - - 99 %  04/14/15 1347 138/78 mmHg 98 F (36.7 C) 91 18 99 %   Constitutional: Well-developed, well-nourished female in no acute distress.  HEART: normal rate, heart sounds, regular rhythm RESP: normal effort, lung sounds clear and equal bilaterally GI: Abd soft, non-tender, gravid appropriate for gestational age.  MS: Extremities nontender, no edema, normal ROM Neurologic: Alert and oriented x 4.  GU: Neg CVAT.    FHT:  Baseline 145 , moderate variability, accelerations present, no decelerations Contractions: None on toco or to palpation   Labs: Results for orders placed or performed during the hospital encounter of 04/14/15 (from the past 24 hour(s))  CBC     Status: Abnormal   Collection Time: 04/14/15  2:21 PM  Result Value Ref Range   WBC 12.7 (H) 4.0 - 10.5 K/uL   RBC 3.70 (L) 3.87 - 5.11 MIL/uL   Hemoglobin 11.2 (L) 12.0 - 15.0 g/dL   HCT 16.1 (L) 09.6 - 04.5 %   MCV 91.4 78.0 - 100.0 fL   MCH 30.3 26.0 - 34.0 pg   MCHC 33.1 30.0 - 36.0 g/dL   RDW 40.9 81.1 - 91.4 %   Platelets 239 150 - 400 K/uL  Comprehensive metabolic panel     Status: Abnormal   Collection Time: 04/14/15  2:21 PM  Result Value Ref Range   Sodium 134 (L) 135 - 145 mmol/L   Potassium 3.6 3.5 - 5.1 mmol/L   Chloride 102 101 - 111 mmol/L   CO2 23 22 - 32 mmol/L   Glucose, Bld 119 (H) 65 - 99 mg/dL   BUN 8 6 - 20 mg/dL   Creatinine, Ser 7.82 0.44 - 1.00 mg/dL   Calcium 95.6 (H) 8.9 - 10.3 mg/dL   Total Protein 6.2 (L) 6.5 - 8.1 g/dL   Albumin 2.8 (L) 3.5 - 5.0 g/dL   AST 23 15 - 41 U/L   ALT 16 14 - 54 U/L   Alkaline Phosphatase 103 38 - 126 U/L    Total Bilirubin 0.7 0.3 - 1.2 mg/dL   GFR calc non Af Amer >60 >60 mL/min   GFR calc Af Amer >60 >60 mL/min   Anion gap 9 5 - 15  D-dimer, quantitative (not at [facility])     Status: Abnormal   Collection Time: 04/14/15  2:21 PM  Result Value Ref Range  D-Dimer, Quant 1.08 (H) 0.00 - 0.50 ug/mL-FEU   --/--/O NEG (06/09 1610)  Imaging:  Ct Angio Chest Pe W/cm &/or Wo Cm  04/14/2015  CLINICAL DATA:  Initial encounter for [redacted] weeks gestation with shortness of breath and mid left-side chest pain. EXAM: CT ANGIOGRAPHY CHEST WITH CONTRAST TECHNIQUE: Multidetector CT imaging of the chest was performed using the standard protocol during bolus administration of intravenous contrast. Multiplanar CT image reconstructions and MIPs were obtained to evaluate the vascular anatomy. CONTRAST:  100mL OMNIPAQUE IOHEXOL 350 MG/ML SOLN COMPARISON:  None. FINDINGS: Mediastinum / Lymph Nodes: No evidence for filling defect in the opacified pulmonary arteries to suggest the presence of an acute pulmonary embolus. Thoracic aorta shows no evidence for intraluminal dissection flap. No evidence for thoracic aortic dissection. There is no axillary lymphadenopathy. 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 No mediastinal lymphadenopathy. There is no hilar lymphadenopathy. The esophagus has normal imaging features. Heart size is normal. No pericardial effusion. Lungs / Pleura: Lung windows show scattered areas of mosaic attenuation. Small vessels in the areas of hypoattenuation are decreased, compatible with underlying hypoperfusion. No focal airspace consolidation. No pulmonary edema or pleural effusion. No worrisome or suspicious pulmonary nodule or mass Upper Abdomen: Visualized portions of the liver and spleen are unremarkable. MSK / Soft Tissues: Bone windows reveal no worrisome lytic or sclerotic osseous lesions. Review of the MIP images confirms the above findings. IMPRESSION: 1.  No CT evidence for acute pulmonary embolus. 2. 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 This pattern can be seen in cases of small airways disease (hypersensitivity pneumonitis, respiratory bronchiolitis, and connective tissue disease). Chronic pulmonary embolism can also generate this appearance, but is considered less likely in this individual. Electronically Signed   By: Kennith CenterEric  Mansell M.D.   On: 04/14/2015 16:00    MAU Course/MDM: I have ordered labs and reviewed results.  Consult Dr Clearance CootsHarper.  CT scan ruled out PE, D-Dimer appropriate for gestation of pregnancy.  Some mosaic attenuation noted on CT.  Consult Dr Sandria Manlyamaswany, pulmonologist.  Plan for outpatient f/u in 6 months, after pt delivery.  Treat for asthma exacerbation at this time with short course of oral corticosteroids and change BID steroid inhaler to recommended Pulmicort per pulmonologist.  Treatments in MAU included GI Cocktail which did not affect pt pain, Percocet 5/325 x 2 tabs which reduced pt pain significantly, and nebulizer breathing treatment which improved pt shortness of breath.  Pt O2 saturation remained 99-100 % on room air while in MAU.  Pt stable at time of discharge.  Assessment: 1. Asthma affecting pregnancy, antepartum   2. Shortness of breath   3. [redacted] weeks gestation of pregnancy   4. Chest pain   5. Asthma with acute exacerbation, mild persistent     Plan: Discharge home Pulmicort 180 BID  Prednisone 40 mg daily x 10 days Percocet 5/325, take 1-2 Q 6 hours PRN x 10 tabs F/U with Dr Gaynell FaceMarshall next week Pulmonology to call pt for follow up outpatient in 6 months Return to MAU as needed for emergencies      Follow-up Information    Follow up with Kathreen CosierMARSHALL,BERNARD A, MD.   Specialty:  Obstetrics and Gynecology   Why:  On Monday, Return to MAU as needed for emergencies   Contact information:   [address] [place] [address] [phone]        Medication List     STOP  taking these medications        beclomethasone 80 MCG/ACT inhaler  Commonly known as:  QVAR      TAKE these medications        budesonide 180 MCG/ACT inhaler  Commonly known as:  PULMICORT  Inhale 1 puff into the lungs 2 (two) times daily.     calcium carbonate 500 MG chewable tablet  Commonly known as:  TUMS - dosed in mg elemental calcium  Chew 1 tablet by mouth 4 (four) times daily as needed for indigestion or heartburn.     CONCEPT OB 130-92.4-1 MG Caps  Take 1 tablet by mouth daily.     oxyCODONE-acetaminophen 5-325 MG tablet  Commonly known as:  PERCOCET/ROXICET  Take 1-2 tablets by mouth every 6 (six) hours as needed.     predniSONE 20 MG tablet  Commonly known as:  DELTASONE  Take 2 tablets (40 mg total) by mouth daily with breakfast.     PROAIR HFA 108 (90 BASE) MCG/ACT inhaler  Generic drug:  albuterol  USE 2 PUFFS EVERY 2 HOURS AS NEEDED FOR WHEEZING OR SHORTNESS OF BREATH        Sharen Counter Certified Nurse-Midwife 04/14/2015 6:47 PM

## 2015-04-17 NOTE — Telephone Encounter (Signed)
This patient has never been seen in office. Schedules are not out for 6 months yet. Do you want her schedule with you?

## 2015-04-18 NOTE — Telephone Encounter (Signed)
Yes she can see me. Fin dout her due date and then we have in our list and give her fu

## 2015-04-19 NOTE — Telephone Encounter (Signed)
LMTCB

## 2015-04-20 NOTE — Telephone Encounter (Signed)
LVM for patient to return call. 

## 2015-04-21 NOTE — Telephone Encounter (Signed)
Pt returning call.[name] ° °

## 2015-04-21 NOTE — Telephone Encounter (Signed)
LMTCB x 1 

## 2015-04-21 NOTE — Telephone Encounter (Signed)
Patient Returned call [phone]

## 2015-04-21 NOTE — Telephone Encounter (Signed)
Called and spoke with patient. Due date is January 21st. I explained to the patient that his schedule has not opened up in July but that I would place her on recall so that she will receive a letter to call and schedule her consult with MR. Patient voiced understanding and had no further questions. Nothing further needed.

## 2015-04-23 ENCOUNTER — Inpatient Hospital Stay (HOSPITAL_COMMUNITY)
Admission: AD | Admit: 2015-04-23 | Discharge: 2015-04-23 | Disposition: A | Discharge status: Home / Self Care | Payer: Medicaid Other | Source: Ambulatory Visit | Attending: Obstetrics | Admitting: Obstetrics

## 2015-04-23 ENCOUNTER — Encounter (HOSPITAL_COMMUNITY): Payer: Self-pay | Admitting: *Deleted

## 2015-04-23 DIAGNOSIS — R1031 Right lower quadrant pain: Secondary | ICD-10-CM

## 2015-04-23 DIAGNOSIS — R030 Elevated blood-pressure reading, without diagnosis of hypertension: Secondary | ICD-10-CM | POA: Diagnosis not present

## 2015-04-23 DIAGNOSIS — O2343 Unspecified infection of urinary tract in pregnancy, third trimester: Secondary | ICD-10-CM | POA: Diagnosis not present

## 2015-04-23 DIAGNOSIS — O163 Unspecified maternal hypertension, third trimester: Secondary | ICD-10-CM

## 2015-04-23 DIAGNOSIS — R109 Unspecified abdominal pain: Secondary | ICD-10-CM | POA: Diagnosis present

## 2015-04-23 DIAGNOSIS — O26893 Other specified pregnancy related conditions, third trimester: Secondary | ICD-10-CM | POA: Insufficient documentation

## 2015-04-23 DIAGNOSIS — O9989 Other specified diseases and conditions complicating pregnancy, childbirth and the puerperium: Secondary | ICD-10-CM

## 2015-04-23 DIAGNOSIS — Z3A32 32 weeks gestation of pregnancy: Secondary | ICD-10-CM | POA: Diagnosis not present

## 2015-04-23 DIAGNOSIS — O26899 Other specified pregnancy related conditions, unspecified trimester: Secondary | ICD-10-CM

## 2015-04-23 LAB — CBC
HEMATOCRIT: 35.2 % — AB (ref 36.0–46.0)
HEMOGLOBIN: 11.7 g/dL — AB (ref 12.0–15.0)
MCH: 30 pg (ref 26.0–34.0)
MCHC: 33.2 g/dL (ref 30.0–36.0)
MCV: 90.3 fL (ref 78.0–100.0)
Platelets: 265 10*3/uL (ref 150–400)
RBC: 3.9 MIL/uL (ref 3.87–5.11)
RDW: 13.6 % (ref 11.5–15.5)
WBC: 15.9 10*3/uL — AB (ref 4.0–10.5)

## 2015-04-23 LAB — COMPREHENSIVE METABOLIC PANEL
ALK PHOS: 104 U/L (ref 38–126)
ALT: 15 U/L (ref 14–54)
AST: 21 U/L (ref 15–41)
Albumin: 2.9 g/dL — ABNORMAL LOW (ref 3.5–5.0)
Anion gap: 8 (ref 5–15)
BILIRUBIN TOTAL: 0.5 mg/dL (ref 0.3–1.2)
BUN: 9 mg/dL (ref 6–20)
CALCIUM: 10.5 mg/dL — AB (ref 8.9–10.3)
CO2: 23 mmol/L (ref 22–32)
Chloride: 104 mmol/L (ref 101–111)
Creatinine, Ser: 0.84 mg/dL (ref 0.44–1.00)
GFR calc Af Amer: >60 mL/min (ref >60)
Glucose, Bld: 107 mg/dL — ABNORMAL HIGH (ref 65–99)
POTASSIUM: 3.5 mmol/L (ref 3.5–5.1)
Sodium: 135 mmol/L (ref 135–145)
TOTAL PROTEIN: 6.1 g/dL — AB (ref 6.5–8.1)

## 2015-04-23 LAB — PROTEIN / CREATININE RATIO, URINE
Creatinine, Urine: 73 mg/dL
PROTEIN CREATININE RATIO: 0.14 mg/mg{creat} (ref 0.00–0.15)
Total Protein, Urine: 10 mg/dL

## 2015-04-23 LAB — URINALYSIS, ROUTINE W REFLEX MICROSCOPIC
Bilirubin Urine: NEGATIVE
GLUCOSE, UA: NEGATIVE mg/dL
HGB URINE DIPSTICK: NEGATIVE
KETONES UR: NEGATIVE mg/dL
Leukocytes, UA: NEGATIVE
Nitrite: NEGATIVE
PH: 7 (ref 5.0–8.0)
PROTEIN: NEGATIVE mg/dL
Specific Gravity, Urine: 1.01 (ref 1.005–1.030)

## 2015-04-23 LAB — URIC ACID: Uric Acid, Serum: 5.7 mg/dL (ref 2.3–6.6)

## 2015-04-23 LAB — LACTATE DEHYDROGENASE: LDH: 132 U/L (ref 98–192)

## 2015-04-23 MED ORDER — GI COCKTAIL ~~LOC~~
30.0000 mL | ORAL | Status: AC
  Administered 2015-04-23: 30 mL via ORAL
  Filled 2015-04-23: qty 30

## 2015-04-23 MED ORDER — CEPHALEXIN 500 MG PO CAPS: 500.0000 mg | ORAL_CAPSULE | Freq: Three times a day (TID) | ORAL | Status: DC

## 2015-04-23 NOTE — MAU Note (Signed)
Pt reports really bad abd cramping since about 7 pm, worsening now. Denies bleeding or ROM. Nausea and chills.

## 2015-04-23 NOTE — MAU Provider Note (Signed)
History     CSN: [id number]  Arrival date and time: 04/23/15 0504   None     No chief complaint on file.  HPI Zigmund Danielllenjane F Carrithers 34 y.o. [id number] @[redacted]w[redacted]d  presents to MAU for shooting pains in her abdomen.  They started at 6pm yesterday and lasted about an hour and a half.  They came back at 4:30am today.   It is located right upper quadrant. It is severe.  She has eaten since the onset and that did not cause exacerbation.   She is also feeling like she needs to urinate often but nothing much comes out.  It is uncomfortable to urinate.  NO change in color or smell.  She also has back pain and right lower quadrant pain that goes to the suprapubic area.  She has headaches in the back of her head but notes this has been intermittent issue for weeks.  She denies vision changes.  She is having swelling in her feet.  The breathing and chest pain problem she had last week are improved.  She denies fever, weakness, vaginal bleeding, LOF.   She is having nausea and reflux.  Her baby is moving well.  OB History    Gravida Para Term Preterm AB TAB SAB Ectopic Multiple Living   4 1 1  0 2 1 1  0 0 1      Past Medical History  Diagnosis Date  . Asthma   . Hypertension   . Anxiety   . Pregnancy induced hypertension   . Kidney stones   . Ovarian cyst   . Vaginal Pap smear, abnormal     bx, f/u ok  . Depression     doing good, not on meds now  . Missed abortion 10/02/2013  . Headache     migraines    Past Surgical History  Procedure Laterality Date  . Cesarean section    . Dilation and curettage of uterus    . Dilation and evacuation N/A 10/04/2013    Procedure: DILATATION AND EVACUATION;  Surgeon: Sherian ReinJody Bovard-Stuckert, MD;  Location: [facility];  Service: Gynecology;  Laterality: N/A;  US in room     Family History  Problem Relation Age of Onset  . Hearing loss Mother   . Asthma Mother   . Diabetes Son   . Cancer Maternal Grandfather     bone  . Liver disease Maternal Grandfather   .  Asthma Father   . Asthma Sister   . Lupus Sister     Social History  Substance Use Topics  . Smoking status: Current Some Day Smoker -- 0.50 packs/day for 15 years    Types: Cigarettes  . Smokeless tobacco: Never Used     Comment: Smoking .5 ppd  . Alcohol Use: No     Comment: very occassionally    Allergies: No Known Allergies  Prescriptions prior to admission  Medication Sig Dispense Refill Last Dose  . budesonide (PULMICORT) 180 MCG/ACT inhaler Inhale 1 puff into the lungs 2 (two) times daily. 1 Inhaler 2 04/22/2015 at Unknown time  . calcium carbonate (TUMS - DOSED IN MG ELEMENTAL CALCIUM) 500 MG chewable tablet Chew 1 tablet by mouth 4 (four) times daily as needed for indigestion or heartburn.   04/23/2015 at Unknown time  . oxyCODONE-acetaminophen (PERCOCET/ROXICET) 5-325 MG tablet Take 1-2 tablets by mouth every 6 (six) hours as needed. 10 tablet 0 Past Week at Unknown time  . predniSONE (DELTASONE) 20 MG tablet Take 2 tablets (40 mg total)  by mouth daily with breakfast. 10 tablet 0 Past Week at Unknown time  . Prenat w/o A Vit-FeFum-FePo-FA (CONCEPT OB) 130-92.4-1 MG CAPS Take 1 tablet by mouth daily. 30 capsule 11 04/22/2015 at Unknown time  . PROAIR HFA 108 (90 BASE) MCG/ACT inhaler USE 2 PUFFS EVERY 2 HOURS AS NEEDED FOR WHEEZING OR SHORTNESS OF BREATH 8.5 Inhaler 3 04/22/2015 at Unknown time    ROS Pertinent ROS in HPI.  All other systems are negative.   Physical Exam   Blood pressure 134/80, pulse 99, temperature 97.7 F (36.5 C), temperature source Oral, resp. rate 18, height  (1.626 m), weight 208 lb (94.348 kg), last menstrual period 09/02/2014, SpO2 98 %.  Physical Exam  Constitutional: She is oriented to person, place, and time. She appears well-developed and well-nourished. No distress.  HENT:  Head: Normocephalic and atraumatic.  Eyes: Conjunctivae and EOM are normal.  Neck: Normal range of motion. Neck supple.  Cardiovascular: Normal rate, regular rhythm  and normal heart sounds.   Respiratory: Effort normal and breath sounds normal. No respiratory distress.  GI: Soft. Bowel sounds are normal. She exhibits no distension. There is tenderness.  Mildly tender to palpation in suprapubic area and RLQ.  No tenderness in RUQ or other areas  Musculoskeletal: Normal range of motion.  Neurological: She is alert and oriented to person, place, and time.  Skin: Skin is warm and dry.  Psychiatric: She has a normal mood and affect. Her behavior is normal.   Fetal Tracing: Baseline:140 Variability:mod Accelerations: 15x15s Decelerations:none Toco:irritability  Results for orders placed or performed during the hospital encounter of 04/23/15 (from the past 24 hour(s))  Urinalysis, Routine w reflex microscopic (not at [facility])     Status: None   Collection Time: 04/23/15  5:11 AM  Result Value Ref Range   Color, Urine YELLOW YELLOW   APPearance CLEAR CLEAR   Specific Gravity, Urine 1.010 1.005 - 1.030   pH 7.0 5.0 - 8.0   Glucose, UA NEGATIVE NEGATIVE mg/dL   Hgb urine dipstick NEGATIVE NEGATIVE   Bilirubin Urine NEGATIVE NEGATIVE   Ketones, ur NEGATIVE NEGATIVE mg/dL   Protein, ur NEGATIVE NEGATIVE mg/dL   Nitrite NEGATIVE NEGATIVE   Leukocytes, UA NEGATIVE NEGATIVE  Protein / creatinine ratio, urine     Status: None   Collection Time: 04/23/15  5:11 AM  Result Value Ref Range   Creatinine, Urine 73.00 mg/dL   Total Protein, Urine 10 mg/dL   Protein Creatinine Ratio 0.14 0.00 - 0.15 mg/mg[Cre]  CBC     Status: Abnormal   Collection Time: 04/23/15  5:40 AM  Result Value Ref Range   WBC 15.9 (H) 4.0 - 10.5 K/uL   RBC 3.90 3.87 - 5.11 MIL/uL   Hemoglobin 11.7 (L) 12.0 - 15.0 g/dL   HCT 16.1 (L) 09.6 - 04.5 %   MCV 90.3 78.0 - 100.0 fL   MCH 30.0 26.0 - 34.0 pg   MCHC 33.2 30.0 - 36.0 g/dL   RDW 40.9 81.1 - 91.4 %   Platelets 265 150 - 400 K/uL  Comprehensive metabolic panel     Status: Abnormal   Collection Time: 04/23/15  5:40 AM  Result  Value Ref Range   Sodium 135 135 - 145 mmol/L   Potassium 3.5 3.5 - 5.1 mmol/L   Chloride 104 101 - 111 mmol/L   CO2 23 22 - 32 mmol/L   Glucose, Bld 107 (H) 65 - 99 mg/dL   BUN 9 6 - 20  mg/dL   Creatinine, Ser 7.82 0.44 - 1.00 mg/dL   Calcium 95.6 (H) 8.9 - 10.3 mg/dL   Total Protein 6.1 (L) 6.5 - 8.1 g/dL   Albumin 2.9 (L) 3.5 - 5.0 g/dL   AST 21 15 - 41 U/L   ALT 15 14 - 54 U/L   Alkaline Phosphatase 104 38 - 126 U/L   Total Bilirubin 0.5 0.3 - 1.2 mg/dL   GFR calc non Af Amer >60 >60 mL/min   GFR calc Af Amer >60 >60 mL/min   Anion gap 8 5 - 15  Lactate dehydrogenase     Status: None   Collection Time: 04/23/15  5:40 AM  Result Value Ref Range   LDH 132 98 - 192 U/L  Uric acid     Status: None   Collection Time: 04/23/15  5:40 AM  Result Value Ref Range   Uric Acid, Serum 5.7 2.3 - 6.6 mg/dL    MAU Course  Procedures  MDM Blood pressure elevated on arrival.  [facility] labs ordered.   GI cocktail ordered Patient with symptoms suggestive of UTI: frequent urination, dysuria, incomplete emptying, abdominal pain, back pain. U/A negative.  Elevated WBC at 15.9. Will treat for UTI as clinical diagnosis.   Pt notes feeling improved.  Blood pressure quickly resolved without treatment.   Assessment and Plan  A:  1. Abdominal pain in pregnancy   2. UTI in pregnancy, third trimester   3.      Elevated blood pressure in pregnancy  P: Discharge to home Keflex - rx given OB urine culture pending Increase fluids F/u with Dr. Gaynell Face in clinic prn/as scheduled Patient may return to MAU as needed or if her condition were to change or worsen   Bertram Denver 04/23/2015, 6:20 AM

## 2015-04-23 NOTE — Discharge Instructions (Signed)
Pregnancy and Urinary Tract Infection  A urinary tract infection (UTI) is a bacterial infection of the urinary tract. Infection of the urinary tract can include the ureters, kidneys (pyelonephritis), bladder (cystitis), and urethra (urethritis). All pregnant women should be screened for bacteria in the urinary tract. Identifying and treating a UTI will decrease the risk of preterm labor and developing more serious infections in both the mother and baby.  CAUSES  Bacteria germs cause almost all UTIs.   RISK FACTORS  Many factors can increase your chances of getting a UTI during pregnancy. These include:  · Having a short urethra.  · Poor toilet and hygiene habits.  · Sexual intercourse.  · Blockage of urine along the urinary tract.  · Problems with the pelvic muscles or nerves.  · Diabetes.  · Obesity.  · Bladder problems after having several children.  · Previous history of UTI.  SIGNS AND SYMPTOMS   · Pain, burning, or a stinging feeling when urinating.  · Suddenly feeling the need to urinate right away (urgency).  · Loss of bladder control (urinary incontinence).  · Frequent urination, more than is common with pregnancy.  · Lower abdominal or back discomfort.  · Cloudy urine.  · Blood in the urine (hematuria).  · Fever.   When the kidneys are infected, the symptoms may be:  · Back pain.  · Flank pain on the right side more so than the left.  · Fever.  · Chills.  · Nausea.  · Vomiting.  DIAGNOSIS   A urinary tract infection is usually diagnosed through urine tests. Additional tests and procedures are sometimes done. These may include:  · Ultrasound exam of the kidneys, ureters, bladder, and urethra.  · Looking in the bladder with a lighted tube (cystoscopy).  TREATMENT  Typically, UTIs can be treated with antibiotic medicines.   HOME CARE INSTRUCTIONS   · Only take over-the-counter or prescription medicines as directed by your health care provider. If you were prescribed antibiotics, take them as directed. Finish  them even if you start to feel better.  · Drink enough fluids to keep your urine clear or pale yellow.  · Do not have sexual intercourse until the infection is gone and your health care provider says it is okay.  · Make sure you are tested for UTIs throughout your pregnancy. These infections often come back.   Preventing a UTI in the Future  · Practice good toilet habits. Always wipe from front to back. Use the tissue only once.  · Do not hold your urine. Empty your bladder as soon as possible when the urge comes.  · Do not douche or use deodorant sprays.  · Wash with soap and warm water around the genital area and the anus.  · Empty your bladder before and after sexual intercourse.  · Wear underwear with a cotton crotch.  · Avoid caffeine and carbonated drinks. They can irritate the bladder.  · Drink cranberry juice or take cranberry pills. This may decrease the risk of getting a UTI.  · Do not drink alcohol.  · Keep all your appointments and tests as scheduled.   SEEK MEDICAL CARE IF:   · Your symptoms get worse.  · You are still having fevers 2 or more days after treatment begins.  · You have a rash.  · You feel that you are having problems with medicines prescribed.  · You have abnormal vaginal discharge.  SEEK IMMEDIATE MEDICAL CARE IF:   · You have back or flank   pain.  · You have chills.  · You have blood in your urine.  · You have nausea and vomiting.  · You have contractions of your uterus.  · You have a gush of fluid from the vagina.  MAKE SURE YOU:  · Understand these instructions.    · Will watch your condition.    · Will get help right away if you are not doing well or get worse.       This information is not intended to replace advice given to you by your health care provider. Make sure you discuss any questions you have with your health care provider.     Document Released: 08/31/2010 Document Revised: 02/24/2013 Document Reviewed: 12/03/2012  Elsevier Interactive Patient Education ©2016 [employer]

## 2015-04-25 LAB — CULTURE, OB URINE

## 2015-05-05 ENCOUNTER — Other Ambulatory Visit (HOSPITAL_COMMUNITY): Payer: Self-pay | Admitting: Obstetrics

## 2015-05-05 DIAGNOSIS — Z3689 Encounter for other specified antenatal screening: Secondary | ICD-10-CM

## 2015-05-05 DIAGNOSIS — O163 Unspecified maternal hypertension, third trimester: Secondary | ICD-10-CM

## 2015-05-09 ENCOUNTER — Other Ambulatory Visit (HOSPITAL_COMMUNITY): Payer: Self-pay | Admitting: Obstetrics

## 2015-05-09 ENCOUNTER — Ambulatory Visit (HOSPITAL_COMMUNITY)
Admission: RE | Admit: 2015-05-09 | Discharge: 2015-05-09 | Disposition: A | Discharge status: Home / Self Care | Payer: Medicaid Other | Source: Ambulatory Visit | Attending: Obstetrics | Admitting: Obstetrics

## 2015-05-09 DIAGNOSIS — O34219 Maternal care for unspecified type scar from previous cesarean delivery: Secondary | ICD-10-CM | POA: Diagnosis not present

## 2015-05-09 DIAGNOSIS — O34211 Maternal care for low transverse scar from previous cesarean delivery: Secondary | ICD-10-CM

## 2015-05-09 DIAGNOSIS — O99333 Smoking (tobacco) complicating pregnancy, third trimester: Secondary | ICD-10-CM | POA: Insufficient documentation

## 2015-05-09 DIAGNOSIS — Z3689 Encounter for other specified antenatal screening: Secondary | ICD-10-CM

## 2015-05-09 DIAGNOSIS — O163 Unspecified maternal hypertension, third trimester: Secondary | ICD-10-CM

## 2015-05-09 DIAGNOSIS — Z3A34 34 weeks gestation of pregnancy: Secondary | ICD-10-CM

## 2015-05-09 DIAGNOSIS — O10013 Pre-existing essential hypertension complicating pregnancy, third trimester: Secondary | ICD-10-CM | POA: Diagnosis not present

## 2015-05-10 LAB — OB RESULTS CONSOLE GBS
STREP GROUP B AG: NEGATIVE
STREP GROUP B AG: POSITIVE

## 2015-05-10 LAB — OB RESULTS CONSOLE GC/CHLAMYDIA
CHLAMYDIA, DNA PROBE: NEGATIVE
GC PROBE AMP, GENITAL: NEGATIVE

## 2015-05-12 ENCOUNTER — Ambulatory Visit (HOSPITAL_COMMUNITY)
Admission: RE | Admit: 2015-05-12 | Discharge: 2015-05-12 | Disposition: A | Discharge status: Home / Self Care | Payer: Medicaid Other | Source: Ambulatory Visit | Attending: Obstetrics | Admitting: Obstetrics

## 2015-05-12 ENCOUNTER — Encounter (HOSPITAL_COMMUNITY): Payer: Self-pay

## 2015-05-12 DIAGNOSIS — Z3A34 34 weeks gestation of pregnancy: Secondary | ICD-10-CM | POA: Insufficient documentation

## 2015-05-12 DIAGNOSIS — O10013 Pre-existing essential hypertension complicating pregnancy, third trimester: Secondary | ICD-10-CM | POA: Insufficient documentation

## 2015-05-16 ENCOUNTER — Ambulatory Visit (HOSPITAL_COMMUNITY)
Admission: RE | Admit: 2015-05-16 | Discharge: 2015-05-16 | Disposition: A | Discharge status: Home / Self Care | Payer: Medicaid Other | Source: Ambulatory Visit | Attending: Obstetrics | Admitting: Obstetrics

## 2015-05-16 ENCOUNTER — Encounter (HOSPITAL_COMMUNITY): Payer: Self-pay

## 2015-05-16 DIAGNOSIS — O10013 Pre-existing essential hypertension complicating pregnancy, third trimester: Secondary | ICD-10-CM | POA: Insufficient documentation

## 2015-05-16 DIAGNOSIS — Z3A35 35 weeks gestation of pregnancy: Secondary | ICD-10-CM | POA: Diagnosis not present

## 2015-05-16 DIAGNOSIS — O99333 Smoking (tobacco) complicating pregnancy, third trimester: Secondary | ICD-10-CM | POA: Diagnosis not present

## 2015-05-16 DIAGNOSIS — F17210 Nicotine dependence, cigarettes, uncomplicated: Secondary | ICD-10-CM | POA: Insufficient documentation

## 2015-05-16 DIAGNOSIS — O34219 Maternal care for unspecified type scar from previous cesarean delivery: Secondary | ICD-10-CM | POA: Diagnosis not present

## 2015-05-16 DIAGNOSIS — O163 Unspecified maternal hypertension, third trimester: Secondary | ICD-10-CM

## 2015-05-16 NOTE — Telephone Encounter (Signed)
error 

## 2015-05-16 NOTE — Telephone Encounter (Signed)
refill 

## 2015-05-17 ENCOUNTER — Encounter (HOSPITAL_COMMUNITY): Payer: Self-pay

## 2015-05-17 ENCOUNTER — Inpatient Hospital Stay (HOSPITAL_COMMUNITY)
Admission: AD | Admit: 2015-05-17 | Discharge: 2015-05-17 | Disposition: A | Discharge status: Home / Self Care | Payer: Medicaid Other | Source: Ambulatory Visit | Attending: Obstetrics | Admitting: Obstetrics

## 2015-05-17 DIAGNOSIS — F17210 Nicotine dependence, cigarettes, uncomplicated: Secondary | ICD-10-CM | POA: Insufficient documentation

## 2015-05-17 DIAGNOSIS — J45901 Unspecified asthma with (acute) exacerbation: Secondary | ICD-10-CM | POA: Diagnosis not present

## 2015-05-17 DIAGNOSIS — R109 Unspecified abdominal pain: Secondary | ICD-10-CM

## 2015-05-17 DIAGNOSIS — O99513 Diseases of the respiratory system complicating pregnancy, third trimester: Secondary | ICD-10-CM | POA: Insufficient documentation

## 2015-05-17 DIAGNOSIS — J029 Acute pharyngitis, unspecified: Secondary | ICD-10-CM | POA: Diagnosis present

## 2015-05-17 DIAGNOSIS — J069 Acute upper respiratory infection, unspecified: Secondary | ICD-10-CM | POA: Insufficient documentation

## 2015-05-17 DIAGNOSIS — O9989 Other specified diseases and conditions complicating pregnancy, childbirth and the puerperium: Secondary | ICD-10-CM | POA: Diagnosis not present

## 2015-05-17 DIAGNOSIS — O99333 Smoking (tobacco) complicating pregnancy, third trimester: Secondary | ICD-10-CM | POA: Insufficient documentation

## 2015-05-17 DIAGNOSIS — O26899 Other specified pregnancy related conditions, unspecified trimester: Secondary | ICD-10-CM

## 2015-05-17 DIAGNOSIS — Z3A35 35 weeks gestation of pregnancy: Secondary | ICD-10-CM | POA: Insufficient documentation

## 2015-05-17 MED ORDER — PREDNISONE 20 MG PO TABS: 40.0000 mg | ORAL_TABLET | Freq: Every day | ORAL | Status: DC

## 2015-05-17 MED ORDER — SPACER/AERO CHAMBER MOUTHPIECE MISC: 1.0000 | Freq: Once | Status: DC

## 2015-05-17 NOTE — MAU Note (Signed)
Urine sent to lab 

## 2015-05-17 NOTE — MAU Note (Signed)
Patient presents with c/o sore throat for the last 4 days and SOB for the past 4 days and right side abdominal pain for about 6 days. Pt. Also feels like she has not been peeing enough. Also c/o decrease fetal movement.

## 2015-05-17 NOTE — MAU Provider Note (Signed)
MAU HISTORY AND PHYSICAL  Chief Complaint:  Sore Throat   [name] is a 34 y.o.  [id number] with IUP at 6856w6d presenting for Sore Throat  Sore throat: 4 days. Also coughing. Took mucinex, didn't help. No fevers. Cough is dry. Feeling short of breath. No leg swelling. One sick contact with similar symptoms. Using inhaler, not with spacer, 3 times a day. 2 puffs.   Also with pain right side of abdomen. Present for 6-7 days. Has happened other times this pregnancy. Evaluated here earlier this month, w/u negative. No dysuria, no fevers.   Past Medical History  Diagnosis Date  . Asthma   . Hypertension   . Anxiety   . Pregnancy induced hypertension   . Kidney stones   . Ovarian cyst   . Vaginal Pap smear, abnormal     bx, f/u ok  . Depression     doing good, not on meds now  . Missed abortion 10/02/2013  . Headache     migraines    Past Surgical History  Procedure Laterality Date  . Cesarean section    . Dilation and curettage of uterus    . Dilation and evacuation N/A 10/04/2013    Procedure: DILATATION AND EVACUATION;  Surgeon: Sherian ReinJody Bovard-Stuckert, MD;  Location: [facility];  Service: Gynecology;  Laterality: N/A;  US in room     Family History  Problem Relation Age of Onset  . Hearing loss Mother   . Asthma Mother   . Diabetes Son   . Cancer Maternal Grandfather     bone  . Liver disease Maternal Grandfather   . Asthma Father   . Asthma Sister   . Lupus Sister     Social History  Substance Use Topics  . Smoking status: Current Some Day Smoker -- 0.50 packs/day for 15 years    Types: Cigarettes  . Smokeless tobacco: Never Used     Comment: Smoking .5 ppd  . Alcohol Use: No     Comment: very occassionally    No Known Allergies  Prescriptions prior to admission  Medication Sig Dispense Refill Last Dose  . acetaminophen (TYLENOL) 325 MG suppository Place 325 mg rectally every 4 (four) hours as needed.   05/17/2015 at Unknown time  . budesonide (PULMICORT)  180 MCG/ACT inhaler Inhale 1 puff into the lungs 2 (two) times daily. 1 Inhaler 2 Past Week at Unknown time  . calcium carbonate (TUMS - DOSED IN MG ELEMENTAL CALCIUM) 500 MG chewable tablet Chew 1 tablet by mouth 4 (four) times daily as needed for indigestion or heartburn.   05/17/2015 at Unknown time  . labetalol (NORMODYNE) 200 MG tablet Take 200 mg by mouth 3 (three) times daily.    05/17/2015 at 1700  . Prenat w/o A Vit-FeFum-FePo-FA (CONCEPT OB) 130-92.4-1 MG CAPS Take 1 tablet by mouth daily. 30 capsule 11 05/17/2015 at Unknown time  . PROAIR HFA 108 (90 BASE) MCG/ACT inhaler USE 2 PUFFS EVERY 2 HOURS AS NEEDED FOR WHEEZING OR SHORTNESS OF BREATH 8.5 Inhaler 3 05/17/2015 at Unknown time  . terconazole (TERAZOL 3) 0.8 % vaginal cream Place 1 applicator vaginally at bedtime.   05/17/2015 at Unknown time  . cephALEXin (KEFLEX) 500 MG capsule Take 1 capsule (500 mg total) by mouth 3 (three) times daily. (Patient not taking: Reported on 05/12/2015) 15 capsule 0 Not Taking  . [DISCONTINUED] predniSONE (DELTASONE) 20 MG tablet Take 2 tablets (40 mg total) by mouth daily with breakfast. (Patient not taking: Reported on  05/12/2015) 10 tablet 0 Not Taking    Review of Systems - Negative except for what is mentioned in HPI.  Physical Exam  Blood pressure 133/80, pulse 91, temperature 98.4 F (36.9 C), temperature source Oral, resp. rate 17, last menstrual period 09/02/2014, SpO2 99 %. GENERAL: Well-developed, well-nourished female in no acute distress.  LUNGS: expiratory wheeze throughoutut, no rales or rhonchi, symmetric, no tachypnea, no use of accessory muscles HEENT: normal pharynx, no lymphadenopathy of neck HEART: Regular rate and rhythm. ABDOMEN: Soft, point tenderness right of the umbilicus, no rebound, nondistended, gravid.  EXTREMITIES: Nontender, no edema, 2+ distal pulses.    Labs: No results found for this or any previous visit (from the past 24 hour(s)).  Imaging Studies:  [place] [facility]  Ob Follow Up  05/09/2015  OBSTETRICAL ULTRASOUND: This exam was performed within a [facility]. The OB US report was generated in the [facility] system, and faxed to the ordering physician.  This report is available in the [employer]. See the [facility] Obstetric US report via the Image Link.  [place] [facility] Ob [facility]  05/16/2015  OBSTETRICAL ULTRASOUND: This exam was performed within a [facility]. The OB US report was generated in the [facility] system, and faxed to the ordering physician.  This report is available in the [employer]. See the [facility] Obstetric US report via the Image Link.   Assessment/Plan: [name] is  34 y.o. [phone] at [redacted]w[redacted]d presents with:  # URI, asthma exacerbation - likely viral. Do not think strep given age, absence of lymphadenopathy, and normal pharynx, and presence of cough. Hx asthma, wheezing on exam, and with cough, so is suffering from asthma, and i think that is the source of her SOB. No pe findings suspicious for pneumonia. Do not think this a severe attack: no tachypnea, no hypoxia, no distress.  - prescribing a spacer for use with albuterol inhaler - albuterol inhaler 2 puffs every 4 hours while symptomatic - prednisone 40 mg po qd for 5 days if 24 hours of improved use of albuterol inhaler not effective - return precautions  # Abdominal pain - mild, nst reactive, point tenderness, no contractions, no bleeding. Previous w/u negative. Possibly 2/2 baby pushing against abdomen - abruption return precautions, ptl return precautions, belly band   Silvano Bilis 12/28/20166:27 PM

## 2015-05-19 ENCOUNTER — Ambulatory Visit (HOSPITAL_COMMUNITY): Payer: Medicaid Other

## 2015-05-19 ENCOUNTER — Encounter (HOSPITAL_COMMUNITY): Payer: Self-pay | Admitting: *Deleted

## 2015-05-19 ENCOUNTER — Ambulatory Visit (HOSPITAL_COMMUNITY)
Admission: RE | Admit: 2015-05-19 | Discharge: 2015-05-19 | Disposition: A | Discharge status: Home / Self Care | Payer: Medicaid Other | Source: Ambulatory Visit | Attending: Maternal and Fetal Medicine | Admitting: Maternal and Fetal Medicine

## 2015-05-19 ENCOUNTER — Inpatient Hospital Stay (HOSPITAL_COMMUNITY): Payer: Medicaid Other

## 2015-05-19 ENCOUNTER — Ambulatory Visit (HOSPITAL_COMMUNITY): Admission: RE | Admit: 2015-05-19 | Payer: Medicaid Other | Source: Ambulatory Visit

## 2015-05-19 ENCOUNTER — Other Ambulatory Visit (HOSPITAL_COMMUNITY): Payer: Self-pay | Admitting: Maternal and Fetal Medicine

## 2015-05-19 ENCOUNTER — Ambulatory Visit (HOSPITAL_COMMUNITY)
Admission: RE | Admit: 2015-05-19 | Discharge: 2015-05-19 | Disposition: A | Discharge status: Home / Self Care | Payer: Medicaid Other | Source: Ambulatory Visit | Attending: Obstetrics | Admitting: Obstetrics

## 2015-05-19 ENCOUNTER — Inpatient Hospital Stay (HOSPITAL_COMMUNITY)
Admission: AD | Admit: 2015-05-19 | Discharge: 2015-05-19 | Disposition: A | Discharge status: Home / Self Care | Payer: Medicaid Other | Source: Ambulatory Visit | Attending: Obstetrics | Admitting: Obstetrics

## 2015-05-19 DIAGNOSIS — J45901 Unspecified asthma with (acute) exacerbation: Secondary | ICD-10-CM | POA: Diagnosis not present

## 2015-05-19 DIAGNOSIS — O288 Other abnormal findings on antenatal screening of mother: Secondary | ICD-10-CM

## 2015-05-19 DIAGNOSIS — O10013 Pre-existing essential hypertension complicating pregnancy, third trimester: Secondary | ICD-10-CM

## 2015-05-19 DIAGNOSIS — O368130 Decreased fetal movements, third trimester, not applicable or unspecified: Secondary | ICD-10-CM | POA: Diagnosis not present

## 2015-05-19 DIAGNOSIS — O34219 Maternal care for unspecified type scar from previous cesarean delivery: Secondary | ICD-10-CM

## 2015-05-19 DIAGNOSIS — O283 Abnormal ultrasonic finding on antenatal screening of mother: Secondary | ICD-10-CM | POA: Insufficient documentation

## 2015-05-19 DIAGNOSIS — N83209 Unspecified ovarian cyst, unspecified side: Secondary | ICD-10-CM | POA: Insufficient documentation

## 2015-05-19 DIAGNOSIS — F329 Major depressive disorder, single episode, unspecified: Secondary | ICD-10-CM | POA: Diagnosis not present

## 2015-05-19 DIAGNOSIS — R0602 Shortness of breath: Secondary | ICD-10-CM

## 2015-05-19 DIAGNOSIS — Z3A36 36 weeks gestation of pregnancy: Secondary | ICD-10-CM

## 2015-05-19 DIAGNOSIS — Z3A35 35 weeks gestation of pregnancy: Secondary | ICD-10-CM

## 2015-05-19 DIAGNOSIS — Z3A37 37 weeks gestation of pregnancy: Secondary | ICD-10-CM | POA: Diagnosis not present

## 2015-05-19 DIAGNOSIS — F17210 Nicotine dependence, cigarettes, uncomplicated: Secondary | ICD-10-CM | POA: Insufficient documentation

## 2015-05-19 DIAGNOSIS — R0981 Nasal congestion: Secondary | ICD-10-CM | POA: Insufficient documentation

## 2015-05-19 DIAGNOSIS — F419 Anxiety disorder, unspecified: Secondary | ICD-10-CM | POA: Insufficient documentation

## 2015-05-19 DIAGNOSIS — Z87442 Personal history of urinary calculi: Secondary | ICD-10-CM | POA: Insufficient documentation

## 2015-05-19 DIAGNOSIS — O9989 Other specified diseases and conditions complicating pregnancy, childbirth and the puerperium: Secondary | ICD-10-CM | POA: Diagnosis present

## 2015-05-19 DIAGNOSIS — O368131 Decreased fetal movements, third trimester, fetus 1: Secondary | ICD-10-CM | POA: Diagnosis not present

## 2015-05-19 LAB — CBC WITH DIFFERENTIAL/PLATELET
BASOS ABS: 0 10*3/uL (ref 0.0–0.1)
Band Neutrophils: 0 %
Basophils Relative: 0 %
Blasts: 0 %
EOS PCT: 0 %
Eosinophils Absolute: 0 10*3/uL (ref 0.0–0.7)
HCT: 33.7 % — ABNORMAL LOW (ref 36.0–46.0)
Hemoglobin: 10.9 g/dL — ABNORMAL LOW (ref 12.0–15.0)
LYMPHS ABS: 1.8 10*3/uL (ref 0.7–4.0)
Lymphocytes Relative: 12 %
MCH: 29.9 pg (ref 26.0–34.0)
MCHC: 32.3 g/dL (ref 30.0–36.0)
MCV: 92.6 fL (ref 78.0–100.0)
METAMYELOCYTES PCT: 0 %
MONOS PCT: 6 %
Monocytes Absolute: 0.9 10*3/uL (ref 0.1–1.0)
Myelocytes: 0 %
NEUTROS ABS: 12.2 10*3/uL — AB (ref 1.7–7.7)
Neutrophils Relative %: 82 %
Other: 0 %
PLATELETS: 242 10*3/uL (ref 150–400)
Promyelocytes Absolute: 0 %
RBC: 3.64 MIL/uL — AB (ref 3.87–5.11)
RDW: 15 % (ref 11.5–15.5)
WBC: 14.9 10*3/uL — AB (ref 4.0–10.5)
nRBC: 0 /100 WBC

## 2015-05-19 LAB — COMPREHENSIVE METABOLIC PANEL
ALT: 27 U/L (ref 14–54)
ANION GAP: 9 (ref 5–15)
AST: 33 U/L (ref 15–41)
Albumin: 2.7 g/dL — ABNORMAL LOW (ref 3.5–5.0)
Alkaline Phosphatase: 117 U/L (ref 38–126)
BUN: 8 mg/dL (ref 6–20)
CHLORIDE: 107 mmol/L (ref 101–111)
CO2: 22 mmol/L (ref 22–32)
Calcium: 9.5 mg/dL (ref 8.9–10.3)
Creatinine, Ser: 0.85 mg/dL (ref 0.44–1.00)
Glucose, Bld: 118 mg/dL — ABNORMAL HIGH (ref 65–99)
POTASSIUM: 3.5 mmol/L (ref 3.5–5.1)
Sodium: 138 mmol/L (ref 135–145)
Total Bilirubin: 0.2 mg/dL — ABNORMAL LOW (ref 0.3–1.2)
Total Protein: 6 g/dL — ABNORMAL LOW (ref 6.5–8.1)

## 2015-05-19 LAB — URINALYSIS, ROUTINE W REFLEX MICROSCOPIC
Bilirubin Urine: NEGATIVE
GLUCOSE, UA: NEGATIVE mg/dL
HGB URINE DIPSTICK: NEGATIVE
Ketones, ur: NEGATIVE mg/dL
Leukocytes, UA: NEGATIVE
Nitrite: NEGATIVE
PH: 7 (ref 5.0–8.0)
Protein, ur: NEGATIVE mg/dL
SPECIFIC GRAVITY, URINE: 1.01 (ref 1.005–1.030)

## 2015-05-19 LAB — PROTEIN / CREATININE RATIO, URINE
CREATININE, URINE: 81 mg/dL
PROTEIN CREATININE RATIO: 0.17 mg/mg{creat} — AB (ref 0.00–0.15)
TOTAL PROTEIN, URINE: 14 mg/dL

## 2015-05-19 LAB — LACTATE DEHYDROGENASE: LDH: 137 U/L (ref 98–192)

## 2015-05-19 LAB — URIC ACID: URIC ACID, SERUM: 5.7 mg/dL (ref 2.3–6.6)

## 2015-05-19 MED ORDER — ALBUTEROL SULFATE (2.5 MG/3ML) 0.083% IN NEBU
2.5000 mg | INHALATION_SOLUTION | Freq: Once | RESPIRATORY_TRACT | Status: AC
  Administered 2015-05-19: 2.5 mg via RESPIRATORY_TRACT
  Filled 2015-05-19: qty 3

## 2015-05-19 NOTE — MAU Provider Note (Signed)
History     CSN: [id number]  Arrival date and time: 05/19/15 1723   First Provider Initiated Contact with Patient 05/19/15 1752      Chief Complaint  Patient presents with  . Decreased Fetal Movement  . Nasal Congestion  . Cough  . Shortness of Breath   HPI [name] 34 y.o. [id number] @[redacted]w[redacted]d  presents to MAU complaining of decreased fetal movement and continued shortness of breath and coughing up mucus.  There is no improvement in her breathing from last visit with use of steroid and inhalers.  The decreased featl movement was just noted today however the breathing/coughing issue has been ongoing x a week.  She was seen by Dr. Gaynell FaceMarshall yesterday.  She is coughing up yellow mucus and wheezing.  She gets SOB with minimal exertion.    She denies fever.  She had NST this am that was nonreactive.  Then had BPP today and no movement noted by pt since then.  She is very worried.  She denies vaginal bleeding, LOF, contractions, dysuria.   OB History    Gravida Para Term Preterm AB TAB SAB Ectopic Multiple Living   4 1 1  0 2 1 1  0 0 1      Past Medical History  Diagnosis Date  . Asthma   . Hypertension   . Anxiety   . Pregnancy induced hypertension   . Kidney stones   . Ovarian cyst   . Vaginal Pap smear, abnormal     bx, f/u ok  . Depression     doing good, not on meds now  . Missed abortion 10/02/2013  . Headache     migraines    Past Surgical History  Procedure Laterality Date  . Cesarean section    . Dilation and curettage of uterus    . Dilation and evacuation N/A 10/04/2013    Procedure: DILATATION AND EVACUATION;  Surgeon: Sherian ReinJody Bovard-Stuckert, MD;  Location: [facility];  Service: Gynecology;  Laterality: N/A;  US in room     Family History  Problem Relation Age of Onset  . Hearing loss Mother   . Asthma Mother   . Diabetes Son   . Cancer Maternal Grandfather     bone  . Liver disease Maternal Grandfather   . Asthma Father   . Asthma Sister   . Lupus Sister      Social History  Substance Use Topics  . Smoking status: Current Some Day Smoker -- 0.50 packs/day for 15 years    Types: Cigarettes  . Smokeless tobacco: Never Used     Comment: Smoking .5 ppd  . Alcohol Use: No     Comment: very occassionally    Allergies: No Known Allergies  Prescriptions prior to admission  Medication Sig Dispense Refill Last Dose  . acetaminophen (TYLENOL) 325 MG suppository Place 325 mg rectally every 4 (four) hours as needed.   05/17/2015 at Unknown time  . budesonide (PULMICORT) 180 MCG/ACT inhaler Inhale 1 puff into the lungs 2 (two) times daily. 1 Inhaler 2 Past Week at Unknown time  . calcium carbonate (TUMS - DOSED IN MG ELEMENTAL CALCIUM) 500 MG chewable tablet Chew 1 tablet by mouth 4 (four) times daily as needed for indigestion or heartburn.   05/17/2015 at Unknown time  . labetalol (NORMODYNE) 200 MG tablet Take 200 mg by mouth 3 (three) times daily.    05/17/2015 at 1700  . predniSONE (DELTASONE) 20 MG tablet Take 2 tablets (40 mg total) by mouth  daily with breakfast. 10 tablet 0   . Prenat w/o A Vit-FeFum-FePo-FA (CONCEPT OB) 130-92.4-1 MG CAPS Take 1 tablet by mouth daily. 30 capsule 11 05/17/2015 at Unknown time  . PROAIR HFA 108 (90 BASE) MCG/ACT inhaler USE 2 PUFFS EVERY 2 HOURS AS NEEDED FOR WHEEZING OR SHORTNESS OF BREATH 8.5 Inhaler 3 05/17/2015 at Unknown time  . Spacer/Aero Chamber Mouthpiece MISC 1 Device by Does not apply route once. 1 each 3   . terconazole (TERAZOL 3) 0.8 % vaginal cream Place 1 applicator vaginally at bedtime.   05/17/2015 at Unknown time    ROS Pertinent ROS in HPI.  All other systems are negative.   Physical Exam   Blood pressure 133/86, pulse 111, resp. rate 18, last menstrual period 09/02/2014, SpO2 100 %.  Physical Exam  Constitutional: She is oriented to person, place, and time. She appears well-developed and well-nourished. No distress.  HENT:  Head: Normocephalic and atraumatic.  Eyes: Conjunctivae and  EOM are normal.  Neck: Normal range of motion. Neck supple.  Cardiovascular: Normal rate and normal heart sounds.   Respiratory: Effort normal. No respiratory distress. She has wheezes.  Expiratory wheezes and "tight" sounding lungs throughout all lung fields bilaterally  GI: Soft. She exhibits no distension. There is no tenderness. There is no rebound and no guarding.  Lymphadenopathy:    She has no cervical adenopathy.  Neurological: She is alert and oriented to person, place, and time.  Skin: Skin is warm and dry.  Psychiatric: She has a normal mood and affect. Her behavior is normal.   Results for orders placed or performed during the hospital encounter of 05/19/15 (from the past 24 hour(s))  Urinalysis, Routine w reflex microscopic (not at [facility])     Status: None   Collection Time: 05/19/15  6:20 PM  Result Value Ref Range   Color, Urine YELLOW YELLOW   APPearance CLEAR CLEAR   Specific Gravity, Urine 1.010 1.005 - 1.030   pH 7.0 5.0 - 8.0   Glucose, UA NEGATIVE NEGATIVE mg/dL   Hgb urine dipstick NEGATIVE NEGATIVE   Bilirubin Urine NEGATIVE NEGATIVE   Ketones, ur NEGATIVE NEGATIVE mg/dL   Protein, ur NEGATIVE NEGATIVE mg/dL   Nitrite NEGATIVE NEGATIVE   Leukocytes, UA NEGATIVE NEGATIVE  Protein / creatinine ratio, urine     Status: Abnormal   Collection Time: 05/19/15  6:20 PM  Result Value Ref Range   Creatinine, Urine 81.00 mg/dL   Total Protein, Urine 14 mg/dL   Protein Creatinine Ratio 0.17 (H) 0.00 - 0.15 mg/mg[Cre]  CBC with Differential/Platelet     Status: Abnormal (Preliminary result)   Collection Time: 05/19/15  6:54 PM  Result Value Ref Range   WBC 14.9 (H) 4.0 - 10.5 K/uL   RBC 3.64 (L) 3.87 - 5.11 MIL/uL   Hemoglobin 10.9 (L) 12.0 - 15.0 g/dL   HCT 40.9 (L) 81.1 - 91.4 %   MCV 92.6 78.0 - 100.0 fL   MCH 29.9 26.0 - 34.0 pg   MCHC 32.3 30.0 - 36.0 g/dL   RDW 78.2 95.6 - 21.3 %   Platelets 242 150 - 400 K/uL   Neutrophils Relative % PENDING %   Neutro Abs  PENDING 1.7 - 7.7 K/uL   Band Neutrophils PENDING %   Lymphocytes Relative PENDING %   Lymphs Abs PENDING 0.7 - 4.0 K/uL   Monocytes Relative PENDING %   Monocytes Absolute PENDING 0.1 - 1.0 K/uL   Eosinophils Relative PENDING %   Eosinophils  Absolute PENDING 0.0 - 0.7 K/uL   Basophils Relative PENDING %   Basophils Absolute PENDING 0.0 - 0.1 K/uL   WBC Morphology PENDING    RBC Morphology PENDING    Smear Review PENDING    Other PENDING %   nRBC PENDING 0 /100 WBC   Metamyelocytes Relative PENDING %   Myelocytes PENDING %   Promyelocytes Absolute PENDING %   Blasts PENDING %  Comprehensive metabolic panel     Status: Abnormal   Collection Time: 05/19/15  6:54 PM  Result Value Ref Range   Sodium 138 135 - 145 mmol/L   Potassium 3.5 3.5 - 5.1 mmol/L   Chloride 107 101 - 111 mmol/L   CO2 22 22 - 32 mmol/L   Glucose, Bld 118 (H) 65 - 99 mg/dL   BUN 8 6 - 20 mg/dL   Creatinine, Ser 1.61 0.44 - 1.00 mg/dL   Calcium 9.5 8.9 - 09.6 mg/dL   Total Protein 6.0 (L) 6.5 - 8.1 g/dL   Albumin 2.7 (L) 3.5 - 5.0 g/dL   AST 33 15 - 41 U/L   ALT 27 14 - 54 U/L   Alkaline Phosphatase 117 38 - 126 U/L   Total Bilirubin 0.2 (L) 0.3 - 1.2 mg/dL   GFR calc non Af Amer >60 >60 mL/min   GFR calc Af Amer >60 >60 mL/min   Anion gap 9 5 - 15  Uric acid     Status: None   Collection Time: 05/19/15  6:54 PM  Result Value Ref Range   Uric Acid, Serum 5.7 2.3 - 6.6 mg/dL  Lactate dehydrogenase     Status: None   Collection Time: 05/19/15  6:54 PM  Result Value Ref Range   LDH 137 98 - 192 U/L   Dg Chest 2 View  05/19/2015  CLINICAL DATA:  34 year old female with shortness of breath EXAM: CHEST  2 VIEW COMPARISON:  Chest CT 05/14/2015 and radiograph 03/20/2014 FINDINGS: The heart size and mediastinal contours are within normal limits. Both lungs are clear. The visualized skeletal structures are unremarkable. IMPRESSION: No active cardiopulmonary disease. Electronically Signed   By: Elgie Collard  M.D.   On: 05/19/2015 19:19    MAU Course  Procedures  MDM PIH labs ordered to eval elevated blood pressure.  BP then improved.  PIH labs normal No elevation in white count.  Pulse likely increased related to albuterol use just before MAU visit.  NST reactive with good acels and no decels.  No contractions.  Irritability present. Movement audible during tracing but pt unable to feel.  O2sats at 99-100% CXR normal Dr. Gaynell Face consulted.  He advises for nebulizer treatment and then discharge to home.   Neb ordered.   Assessment and Plan  A: Asthma exacerbation Decreased fetal movement  P: Discharge to home Continue Pulmicort, steroids, albuterol  F/u in clinic as scheduled Good PO hydration Kick counts after eating Patient may return to MAU as needed or if her condition were to change or worsen   Bertram Denver 05/19/2015, 5:54 PM

## 2015-05-19 NOTE — Discharge Instructions (Signed)
Asthma, Acute Bronchospasm Acute bronchospasm caused by asthma is also referred to as an asthma attack. Bronchospasm means your air passages become narrowed. The narrowing is caused by inflammation and tightening of the muscles in the air tubes (bronchi) in your lungs. This can make it hard to breathe or cause you to wheeze and cough. CAUSES Possible triggers are: 1. Animal dander from the skin, hair, or feathers of animals. 2. Dust mites contained in house dust. 3. Cockroaches. 4. Pollen from trees or grass. 5. Mold. 6. Cigarette or tobacco smoke. 7. Air pollutants such as dust, household cleaners, hair sprays, aerosol sprays, paint fumes, strong chemicals, or strong odors. 8. Cold air or weather changes. Cold air may trigger inflammation. Winds increase molds and pollens in the air. 9. Strong emotions such as crying or laughing hard. 10. Stress. 11. Certain medicines such as aspirin or beta-blockers. 12. Sulfites in foods and drinks, such as dried fruits and wine. 13. Infections or inflammatory conditions, such as a flu, cold, or inflammation of the nasal membranes (rhinitis). 14. Gastroesophageal reflux disease (GERD). GERD is a condition where stomach acid backs up into your esophagus. 15. Exercise or strenuous activity. SIGNS AND SYMPTOMS  1. Wheezing. 2. Excessive coughing, particularly at night. 3. Chest tightness. 4. Shortness of breath. DIAGNOSIS  Your health care provider will ask you about your medical history and perform a physical exam. A chest X-ray or blood testing may be performed to look for other causes of your symptoms or other conditions that may have triggered your asthma attack. TREATMENT  Treatment is aimed at reducing inflammation and opening up the airways in your lungs. Most asthma attacks are treated with inhaled medicines. These include quick relief or rescue medicines (such as bronchodilators) and controller medicines (such as inhaled corticosteroids). These  medicines are sometimes given through an inhaler or a nebulizer. Systemic steroid medicine taken by mouth or given through an IV tube also can be used to reduce the inflammation when an attack is moderate or severe. Antibiotic medicines are only used if a bacterial infection is present.  HOME CARE INSTRUCTIONS   Rest.  Drink plenty of liquids. This helps the mucus to remain thin and be easily coughed up. Only use caffeine in moderation and do not use alcohol until you have recovered from your illness.  Do not smoke. Avoid being exposed to secondhand smoke.  You play a critical role in keeping yourself in good health. Avoid exposure to things that cause you to wheeze or to have breathing problems.  Keep your medicines up-to-date and available. Carefully follow your health care provider's treatment plan.  Take your medicine exactly as prescribed.  When pollen or pollution is bad, keep windows closed and use an air conditioner or go to places with air conditioning.  Asthma requires careful medical care. See your health care provider for a follow-up as advised. If you are more than [redacted] weeks pregnant and you were prescribed any new medicines, let your obstetrician know about the visit and how you are doing. Follow up with your health care provider as directed.  After you have recovered from your asthma attack, make an appointment with your outpatient doctor to talk about ways to reduce the likelihood of future attacks. If you do not have a doctor who manages your asthma, make an appointment with a primary care doctor to discuss your asthma. SEEK IMMEDIATE MEDICAL CARE IF:   You are getting worse.  You have trouble breathing. If severe, call your local  emergency services (911 in the [place]  You develop chest pain or discomfort.  You are vomiting.  You are not able to keep fluids down.  You are coughing up yellow, green, brown, or bloody sputum.  You have a fever and your symptoms suddenly  get worse.  You have trouble swallowing. MAKE SURE YOU:   Understand these instructions.  Will watch your condition.  Will get help right away if you are not doing well or get worse.   This information is not intended to replace advice given to you by your health care provider. Make sure you discuss any questions you have with your health care provider.   Document Released: 08/21/2006 Document Revised: 05/11/2013 Document Reviewed: 11/11/2012 Elsevier Interactive Patient Education 2016 [employer]  Fetal Movement Counts Patient Name: __________________________________________________ Patient Due Date: ____________________ Performing a fetal movement count is highly recommended in high-risk pregnancies, but it is good for every pregnant woman to do. Your health care provider may ask you to start counting fetal movements at 28 weeks of the pregnancy. Fetal movements often increase: 16. After eating a full meal. 17. After physical activity. 18. After eating or drinking something sweet or cold. 19. At rest. Pay attention to when you feel the baby is most active. This will help you notice a pattern of your baby's sleep and wake cycles and what factors contribute to an increase in fetal movement. It is important to perform a fetal movement count at the same time each day when your baby is normally most active.  HOW TO COUNT FETAL MOVEMENTS 5. Find a quiet and comfortable area to sit or lie down on your left side. Lying on your left side provides the best blood and oxygen circulation to your baby. 6. Write down the day and time on a sheet of paper or in a journal. 7. Start counting kicks, flutters, swishes, rolls, or jabs in a 2-hour period. You should feel at least 10 movements within 2 hours. 8. If you do not feel 10 movements in 2 hours, wait 2-3 hours and count again. Look for a change in the pattern or not enough counts in 2 hours. SEEK MEDICAL CARE IF:  You feel less than 10 counts  in 2 hours, tried twice.  There is no movement in over an hour.  The pattern is changing or taking longer each day to reach 10 counts in 2 hours.  You feel the baby is not moving as he or she usually does. Date: ____________ Movements: ____________ Start time: ____________ Doreatha Martin time: ____________  Date: ____________ Movements: ____________ Start time: ____________ Doreatha Martin time: ____________ Date: ____________ Movements: ____________ Start time: ____________ Doreatha Martin time: ____________ Date: ____________ Movements: ____________ Start time: ____________ Doreatha Martin time: ____________ Date: ____________ Movements: ____________ Start time: ____________ Doreatha Martin time: ____________ Date: ____________ Movements: ____________ Start time: ____________ Doreatha Martin time: ____________ Date: ____________ Movements: ____________ Start time: ____________ Doreatha Martin time: ____________ Date: ____________ Movements: ____________ Start time: ____________ Doreatha Martin time: ____________  Date: ____________ Movements: ____________ Start time: ____________ Doreatha Martin time: ____________ Date: ____________ Movements: ____________ Start time: ____________ Doreatha Martin time: ____________ Date: ____________ Movements: ____________ Start time: ____________ Doreatha Martin time: ____________ Date: ____________ Movements: ____________ Start time: ____________ Doreatha Martin time: ____________ Date: ____________ Movements: ____________ Start time: ____________ Doreatha Martin time: ____________ Date: ____________ Movements: ____________ Start time: ____________ Doreatha Martin time: ____________ Date: ____________ Movements: ____________ Start time: ____________ Doreatha Martin time: ____________  Date: ____________ Movements: ____________ Start time: ____________ Doreatha Martin time: ____________ Date: ____________ Movements: ____________ Start time: ____________ Doreatha Martin time:  ____________ Date: ____________ Movements: ____________ Start time: ____________ Doreatha MartinFinish time: ____________ Date: ____________  Movements: ____________ Start time: ____________ Doreatha MartinFinish time: ____________ Date: ____________ Movements: ____________ Start time: ____________ Doreatha MartinFinish time: ____________ Date: ____________ Movements: ____________ Start time: ____________ Doreatha MartinFinish time: ____________ Date: ____________ Movements: ____________ Start time: ____________ Doreatha MartinFinish time: ____________  Date: ____________ Movements: ____________ Start time: ____________ Doreatha MartinFinish time: ____________ Date: ____________ Movements: ____________ Start time: ____________ Doreatha MartinFinish time: ____________ Date: ____________ Movements: ____________ Start time: ____________ Doreatha MartinFinish time: ____________ Date: ____________ Movements: ____________ Start time: ____________ Doreatha MartinFinish time: ____________ Date: ____________ Movements: ____________ Start time: ____________ Doreatha MartinFinish time: ____________ Date: ____________ Movements: ____________ Start time: ____________ Doreatha MartinFinish time: ____________ Date: ____________ Movements: ____________ Start time: ____________ Doreatha MartinFinish time: ____________  Date: ____________ Movements: ____________ Start time: ____________ Doreatha MartinFinish time: ____________ Date: ____________ Movements: ____________ Start time: ____________ Doreatha MartinFinish time: ____________ Date: ____________ Movements: ____________ Start time: ____________ Doreatha MartinFinish time: ____________ Date: ____________ Movements: ____________ Start time: ____________ Doreatha MartinFinish time: ____________ Date: ____________ Movements: ____________ Start time: ____________ Doreatha MartinFinish time: ____________ Date: ____________ Movements: ____________ Start time: ____________ Doreatha MartinFinish time: ____________ Date: ____________ Movements: ____________ Start time: ____________ Doreatha MartinFinish time: ____________  Date: ____________ Movements: ____________ Start time: ____________ Doreatha MartinFinish time: ____________ Date: ____________ Movements: ____________ Start time: ____________ Doreatha MartinFinish time: ____________ Date: ____________ Movements: ____________ Start time:  ____________ Doreatha MartinFinish time: ____________ Date: ____________ Movements: ____________ Start time: ____________ Doreatha MartinFinish time: ____________ Date: ____________ Movements: ____________ Start time: ____________ Doreatha MartinFinish time: ____________ Date: ____________ Movements: ____________ Start time: ____________ Doreatha MartinFinish time: ____________ Date: ____________ Movements: ____________ Start time: ____________ Doreatha MartinFinish time: ____________  Date: ____________ Movements: ____________ Start time: ____________ Doreatha MartinFinish time: ____________ Date: ____________ Movements: ____________ Start time: ____________ Doreatha MartinFinish time: ____________ Date: ____________ Movements: ____________ Start time: ____________ Doreatha MartinFinish time: ____________ Date: ____________ Movements: ____________ Start time: ____________ Doreatha MartinFinish time: ____________ Date: ____________ Movements: ____________ Start time: ____________ Doreatha MartinFinish time: ____________ Date: ____________ Movements: ____________ Start time: ____________ Doreatha MartinFinish time: ____________ Date: ____________ Movements: ____________ Start time: ____________ Doreatha MartinFinish time: ____________  Date: ____________ Movements: ____________ Start time: ____________ Doreatha MartinFinish time: ____________ Date: ____________ Movements: ____________ Start time: ____________ Doreatha MartinFinish time: ____________ Date: ____________ Movements: ____________ Start time: ____________ Doreatha MartinFinish time: ____________ Date: ____________ Movements: ____________ Start time: ____________ Doreatha MartinFinish time: ____________ Date: ____________ Movements: ____________ Start time: ____________ Doreatha MartinFinish time: ____________ Date: ____________ Movements: ____________ Start time: ____________ Doreatha MartinFinish time: ____________   This information is not intended to replace advice given to you by your health care provider. Make sure you discuss any questions you have with your health care provider.   Document Released: 06/05/2006 Document Revised: 05/27/2014 Document Reviewed: 03/02/2012 Elsevier Interactive  Patient Education 2016 [employer]  How to Use an Inhaler Proper inhaler technique is very important. Good technique ensures that the medicine reaches the lungs. Poor technique results in depositing the medicine on the tongue and back of the throat rather than in the airways. If you do not use the inhaler with good technique, the medicine will not help you. STEPS TO FOLLOW IF USING AN INHALER WITHOUT AN EXTENSION TUBE 20. Remove the cap from the inhaler. 21. If you are using the inhaler for the first time, you will need to prime it. Shake the inhaler for 5 seconds and release four puffs into the air, away from your face. Ask your health care provider or pharmacist if you have questions about priming your inhaler. 22. Shake the inhaler for 5 seconds before each breath in (inhalation). 23. Position the inhaler so that the top of the canister faces up. 24. Put your  index finger on the top of the medicine canister. Your thumb supports the bottom of the inhaler. 25. Open your mouth. 26. Either place the inhaler between your teeth and place your lips tightly around the mouthpiece, or hold the inhaler 1-2 inches away from your open mouth. If you are unsure of which technique to use, ask your health care provider. 27. Breathe out (exhale) normally and as completely as possible. 28. Press the canister down with your index finger to release the medicine. 29. At the same time as the canister is pressed, inhale deeply and slowly until your lungs are completely filled. This should take 4-6 seconds. Keep your tongue down. 30. Hold the medicine in your lungs for 5-10 seconds (10 seconds is best). This helps the medicine get into the small airways of your lungs. 31. Breathe out slowly, through pursed lips. Whistling is an example of pursed lips. 32. Wait at least 15-30 seconds between puffs. Continue with the above steps until you have taken the number of puffs your health care provider has ordered. Do not use  the inhaler more than your health care provider tells you. 33. Replace the cap on the inhaler. 34. Follow the directions from your health care provider or the inhaler insert for cleaning the inhaler. STEPS TO FOLLOW IF USING AN INHALER WITH AN EXTENSION (SPACER) 9. Remove the cap from the inhaler. 10. If you are using the inhaler for the first time, you will need to prime it. Shake the inhaler for 5 seconds and release four puffs into the air, away from your face. Ask your health care provider or pharmacist if you have questions about priming your inhaler. 11. Shake the inhaler for 5 seconds before each breath in (inhalation). 12. Place the open end of the spacer onto the mouthpiece of the inhaler. 13. Position the inhaler so that the top of the canister faces up and the spacer mouthpiece faces you. 14. Put your index finger on the top of the medicine canister. Your thumb supports the bottom of the inhaler and the spacer. 15. Breathe out (exhale) normally and as completely as possible. 16. Immediately after exhaling, place the spacer between your teeth and into your mouth. Close your lips tightly around the spacer. 17. Press the canister down with your index finger to release the medicine. 18. At the same time as the canister is pressed, inhale deeply and slowly until your lungs are completely filled. This should take 4-6 seconds. Keep your tongue down and out of the way. 19. Hold the medicine in your lungs for 5-10 seconds (10 seconds is best). This helps the medicine get into the small airways of your lungs. Exhale. 20. Repeat inhaling deeply through the spacer mouthpiece. Again hold that breath for up to 10 seconds (10 seconds is best). Exhale slowly. If it is difficult to take this second deep breath through the spacer, breathe normally several times through the spacer. Remove the spacer from your mouth. 21. Wait at least 15-30 seconds between puffs. Continue with the above steps until you have  taken the number of puffs your health care provider has ordered. Do not use the inhaler more than your health care provider tells you. 22. Remove the spacer from the inhaler, and place the cap on the inhaler. 23. Follow the directions from your health care provider or the inhaler insert for cleaning the inhaler and spacer. If you are using different kinds of inhalers, use your quick relief medicine to open the airways 10-15  minutes before using a steroid if instructed to do so by your health care provider. If you are unsure which inhalers to use and the order of using them, ask your health care provider, nurse, or respiratory therapist. If you are using a steroid inhaler, always rinse your mouth with water after your last puff, then gargle and spit out the water. Do not swallow the water. AVOID:  Inhaling before or after starting the spray of medicine. It takes practice to coordinate your breathing with triggering the spray.  Inhaling through the nose (rather than the mouth) when triggering the spray. HOW TO DETERMINE IF YOUR INHALER IS FULL OR NEARLY EMPTY You cannot know when an inhaler is empty by shaking it. A few inhalers are now being made with dose counters. Ask your health care provider for a prescription that has a dose counter if you feel you need that extra help. If your inhaler does not have a counter, ask your health care provider to help you determine the date you need to refill your inhaler. Write the refill date on a calendar or your inhaler canister. Refill your inhaler 7-10 days before it runs out. Be sure to keep an adequate supply of medicine. This includes making sure it is not expired, and that you have a spare inhaler.  SEEK MEDICAL CARE IF:   Your symptoms are only partially relieved with your inhaler.  You are having trouble using your inhaler.  You have some increase in phlegm. SEEK IMMEDIATE MEDICAL CARE IF:   You feel little or no relief with your inhalers. You are  still wheezing and are feeling shortness of breath or tightness in your chest or both.  You have dizziness, headaches, or a fast heart rate.  You have chills, fever, or night sweats.  You have a noticeable increase in phlegm production, or there is blood in the phlegm. MAKE SURE YOU:   Understand these instructions.  Will watch your condition.  Will get help right away if you are not doing well or get worse.   This information is not intended to replace advice given to you by your health care provider. Make sure you discuss any questions you have with your health care provider.   Document Released: 05/03/2000 Document Revised: 02/24/2013 Document Reviewed: 12/03/2012 Elsevier Interactive Patient Education [employer]

## 2015-05-19 NOTE — MAU Note (Signed)
Had two NST this week had BPP, had US which was fine was having decreased fetal movement, has not felt movement since leaving around 12:45 pm, coughing green sputum

## 2015-05-23 ENCOUNTER — Ambulatory Visit (HOSPITAL_COMMUNITY)
Admission: RE | Admit: 2015-05-23 | Discharge: 2015-05-23 | Disposition: A | Discharge status: Home / Self Care | Payer: Medicaid Other | Source: Ambulatory Visit | Attending: Obstetrics | Admitting: Obstetrics

## 2015-05-23 DIAGNOSIS — O163 Unspecified maternal hypertension, third trimester: Secondary | ICD-10-CM

## 2015-05-23 DIAGNOSIS — O10013 Pre-existing essential hypertension complicating pregnancy, third trimester: Secondary | ICD-10-CM | POA: Insufficient documentation

## 2015-05-23 DIAGNOSIS — O99333 Smoking (tobacco) complicating pregnancy, third trimester: Secondary | ICD-10-CM | POA: Diagnosis not present

## 2015-05-23 DIAGNOSIS — O34219 Maternal care for unspecified type scar from previous cesarean delivery: Secondary | ICD-10-CM | POA: Insufficient documentation

## 2015-05-23 DIAGNOSIS — F17210 Nicotine dependence, cigarettes, uncomplicated: Secondary | ICD-10-CM | POA: Insufficient documentation

## 2015-05-23 DIAGNOSIS — Z3A36 36 weeks gestation of pregnancy: Secondary | ICD-10-CM | POA: Insufficient documentation

## 2015-05-26 ENCOUNTER — Encounter (HOSPITAL_COMMUNITY): Payer: Self-pay

## 2015-05-26 ENCOUNTER — Ambulatory Visit (HOSPITAL_COMMUNITY)
Admission: RE | Admit: 2015-05-26 | Discharge: 2015-05-26 | Disposition: A | Discharge status: Home / Self Care | Payer: Medicaid Other | Source: Ambulatory Visit | Attending: Obstetrics | Admitting: Obstetrics

## 2015-05-26 ENCOUNTER — Ambulatory Visit (HOSPITAL_COMMUNITY): Payer: No Typology Code available for payment source

## 2015-05-26 DIAGNOSIS — F17210 Nicotine dependence, cigarettes, uncomplicated: Secondary | ICD-10-CM | POA: Diagnosis not present

## 2015-05-26 DIAGNOSIS — O10013 Pre-existing essential hypertension complicating pregnancy, third trimester: Secondary | ICD-10-CM | POA: Insufficient documentation

## 2015-05-26 DIAGNOSIS — O99333 Smoking (tobacco) complicating pregnancy, third trimester: Secondary | ICD-10-CM | POA: Diagnosis not present

## 2015-05-29 ENCOUNTER — Encounter (HOSPITAL_COMMUNITY): Payer: Self-pay | Admitting: *Deleted

## 2015-05-29 ENCOUNTER — Inpatient Hospital Stay (HOSPITAL_COMMUNITY)
Admission: AD | Admit: 2015-05-29 | Discharge: 2015-05-29 | Disposition: A | Discharge status: Home / Self Care | Payer: Medicaid Other | Source: Ambulatory Visit | Attending: Obstetrics | Admitting: Obstetrics

## 2015-05-29 ENCOUNTER — Other Ambulatory Visit (HOSPITAL_COMMUNITY): Payer: Medicaid Other

## 2015-05-29 ENCOUNTER — Ambulatory Visit (HOSPITAL_COMMUNITY): Payer: Medicaid Other

## 2015-05-29 DIAGNOSIS — G43909 Migraine, unspecified, not intractable, without status migrainosus: Secondary | ICD-10-CM | POA: Diagnosis not present

## 2015-05-29 DIAGNOSIS — F419 Anxiety disorder, unspecified: Secondary | ICD-10-CM | POA: Diagnosis not present

## 2015-05-29 DIAGNOSIS — I10 Essential (primary) hypertension: Secondary | ICD-10-CM | POA: Diagnosis not present

## 2015-05-29 DIAGNOSIS — F329 Major depressive disorder, single episode, unspecified: Secondary | ICD-10-CM | POA: Diagnosis not present

## 2015-05-29 DIAGNOSIS — O99333 Smoking (tobacco) complicating pregnancy, third trimester: Secondary | ICD-10-CM | POA: Diagnosis not present

## 2015-05-29 DIAGNOSIS — Z3A37 37 weeks gestation of pregnancy: Secondary | ICD-10-CM | POA: Diagnosis not present

## 2015-05-29 DIAGNOSIS — O26893 Other specified pregnancy related conditions, third trimester: Secondary | ICD-10-CM | POA: Diagnosis not present

## 2015-05-29 DIAGNOSIS — R05 Cough: Secondary | ICD-10-CM | POA: Diagnosis present

## 2015-05-29 DIAGNOSIS — J4531 Mild persistent asthma with (acute) exacerbation: Secondary | ICD-10-CM

## 2015-05-29 DIAGNOSIS — R109 Unspecified abdominal pain: Secondary | ICD-10-CM | POA: Diagnosis present

## 2015-05-29 LAB — URINALYSIS, ROUTINE W REFLEX MICROSCOPIC
BILIRUBIN URINE: NEGATIVE
GLUCOSE, UA: NEGATIVE mg/dL
HGB URINE DIPSTICK: NEGATIVE
KETONES UR: NEGATIVE mg/dL
Leukocytes, UA: NEGATIVE
NITRITE: NEGATIVE
PH: 6.5 (ref 5.0–8.0)
Protein, ur: NEGATIVE mg/dL
SPECIFIC GRAVITY, URINE: 1.015 (ref 1.005–1.030)

## 2015-05-29 MED ORDER — AZITHROMYCIN 250 MG PO TABS: ORAL_TABLET | ORAL | Status: DC

## 2015-05-29 MED ORDER — GUAIFENESIN-DM 100-10 MG/5ML PO SYRP: 5.0000 mL | ORAL_SOLUTION | ORAL | Status: DC | PRN

## 2015-05-29 MED ORDER — ALBUTEROL SULFATE (2.5 MG/3ML) 0.083% IN NEBU
2.5000 mg | INHALATION_SOLUTION | Freq: Once | RESPIRATORY_TRACT | Status: AC
  Administered 2015-05-29: 2.5 mg via RESPIRATORY_TRACT
  Filled 2015-05-29: qty 3

## 2015-05-29 NOTE — MAU Provider Note (Signed)
History     CSN: [id number]  Arrival date and time: 05/29/15 1139   First Provider Initiated Contact with Patient 05/29/15 1215      Chief Complaint  Patient presents with  . Cough  . Abdominal Pain   HPI Comments: [name] is a 35 y.o. M5H8469 at [redacted]w[redacted]d presenting with several day history of cough productive of green mucus. Reports wheezing and used Proair HFA inhaler with spacer just prior to arrival. Uses inhaler 3 times a day. Denies SOB.  She was seen here 05/17/2015 for productive cough with wheezing and sore throat. Was given spacer for her inhaler used regularly for chronic asthma and was given a 5 day course of prednisone. States sorethroat resolved but coughing is worse and sputum turned darker. Prenatal course complicated by hypertension on labetalol.   Cough This is a recurrent problem. The current episode started 1 to 4 weeks ago. The problem has been gradually worsening. The cough is productive of purulent sputum. Pertinent negatives include no chest pain, chills, ear congestion, nasal congestion, sore throat or shortness of breath. Nothing aggravates the symptoms. She has tried steroid inhaler for the symptoms. Her past medical history is significant for asthma. There is no history of pneumonia.    OB History    Gravida Para Term Preterm AB TAB SAB Ectopic Multiple Living   4 1 1  0 2 1 1  0 0 1        Past Medical History  Diagnosis Date  . Asthma   . Hypertension   . Anxiety   . Pregnancy induced hypertension   . Kidney stones   . Ovarian cyst   . Vaginal Pap smear, abnormal     bx, f/u ok  . Depression     doing good, not on meds now  . Missed abortion 10/02/2013  . Headache     migraines    Past Surgical History  Procedure Laterality Date  . Cesarean section    . Dilation and curettage of uterus    . Dilation and evacuation N/A 10/04/2013    Procedure: DILATATION AND EVACUATION;  Surgeon: Sherian Rein, MD;  Location: [facility];   Service: Gynecology;  Laterality: N/A;  [place] in room     Family History  Problem Relation Age of Onset  . Hearing loss Mother   . Asthma Mother   . Diabetes Son   . Cancer Maternal Grandfather     bone  . Liver disease Maternal Grandfather   . Asthma Father   . Asthma Sister   . Lupus Sister     Social History  Substance Use Topics  . Smoking status: Current Some Day Smoker -- 0.50 packs/day for 15 years    Types: Cigarettes  . Smokeless tobacco: Never Used     Comment: Smoking .5 ppd  . Alcohol Use: No     Comment: very occassionally    Allergies: No Known Allergies  Prescriptions prior to admission  Medication Sig Dispense Refill Last Dose  . acetaminophen (TYLENOL) 325 MG tablet Take 650 mg by mouth every 6 (six) hours as needed for moderate pain or headache.   Taking  . beclomethasone (QVAR) 80 MCG/ACT inhaler Inhale 2 puffs into the lungs 2 (two) times daily.   Taking  . budesonide (PULMICORT) 180 MCG/ACT inhaler Inhale 1 puff into the lungs 2 (two) times daily. 1 Inhaler 2 Taking  . calcium carbonate (TUMS - DOSED IN MG ELEMENTAL CALCIUM) 500 MG chewable tablet Chew 1 tablet  by mouth 4 (four) times daily as needed for indigestion or heartburn.   Taking  . labetalol (NORMODYNE) 200 MG tablet Take 200 mg by mouth 4 (four) times daily.    Taking  . predniSONE (DELTASONE) 20 MG tablet Take 2 tablets (40 mg total) by mouth daily with breakfast. (Patient not taking: Reported on 05/26/2015) 10 tablet 0 Not Taking  . Prenat w/o A Vit-FeFum-FePo-FA (CONCEPT OB) 130-92.4-1 MG CAPS Take 1 tablet by mouth daily. 30 capsule 11 Taking  . PROAIR HFA 108 (90 BASE) MCG/ACT inhaler USE 2 PUFFS EVERY 2 HOURS AS NEEDED FOR WHEEZING OR SHORTNESS OF BREATH 8.5 Inhaler 3 Taking  . Spacer/Aero Chamber Mouthpiece MISC 1 Device by Does not apply route once. 1 each 3 Taking    Review of Systems  Constitutional: Negative for chills.  HENT: Negative for sore throat.   Respiratory: Positive for  cough. Negative for shortness of breath.   Cardiovascular: Negative for chest pain.   Physical Exam   Blood pressure 136/85, pulse 103, temperature 98.2 F (36.8 C), temperature source Oral, resp. rate 18, last menstrual period 09/02/2014.  Physical Exam  Nursing note and vitals reviewed. Constitutional: She is oriented to person, place, and time. She appears well-developed and well-nourished. No distress.  HENT:  Head: Normocephalic.  Eyes: Pupils are equal, round, and reactive to light.  Neck: Normal range of motion.  Respiratory: Effort normal. No respiratory distress. She has no wheezes. She exhibits no tenderness.  Occasional expiratory squeak right base. Expiratory wheezes RML. Good air movement and no persisting adventitious sounds.  GI: Soft. There is no tenderness.  Genitourinary:  SVE: cervix posterior, long, closed, -3 cephalic  Musculoskeletal: Normal range of motion.  Neurological: She is alert and oriented to person, place, and time.  Skin: Skin is warm and dry.    MAU Course  Procedures  Albuterol nebs treatment per RT> symptoms resolved. Chest CTA.   EFM: FHR: Baseline 150, moderate variability, reactive Contractions: Irregular mild  MDM No fever/chills CP c/w pneumonia and no chest tightness or wheezing after 1 albuterol neb tx. WIll F/U in office.  Respiratory therapist recommends pulmonology referral for medication adjustment due to multiple ED visits> will notify Dr. Gaynell FaceMarshall who will see pt this week.  Assessment and Plan  [id number] at [id number] Reactive NST, not in labor 1. Asthma with exacerbation, mild persistent   Possible secondary LRI   Medication List    STOP taking these medications        predniSONE 20 MG tablet  Commonly known as:  DELTASONE      TAKE these medications        azithromycin 250 MG tablet  Commonly known as:  ZITHROMAX Z-PAK  Take 2 tabs po at once for 1 day, then 1 tab po per day x 4days     beclomethasone 80 MCG/ACT  inhaler  Commonly known as:  QVAR  Inhale 2 puffs into the lungs 2 (two) times daily.     calcium carbonate 500 MG chewable tablet  Commonly known as:  TUMS - dosed in mg elemental calcium  Chew 1 tablet by mouth 4 (four) times daily as needed for indigestion or heartburn.     CONCEPT OB 130-92.4-1 MG Caps  Take 1 tablet by mouth daily.     guaiFENesin-dextromethorphan 100-10 MG/5ML syrup  Commonly known as:  ROBITUSSIN DM  Take 5 mLs by mouth every 4 (four) hours as needed for cough.     labetalol 200 MG tablet  Commonly known as:  NORMODYNE  Take 200 mg by mouth 4 (four) times daily.     PROAIR HFA 108 (90 Base) MCG/ACT inhaler  Generic drug:  albuterol  USE 2 PUFFS EVERY 2 HOURS AS NEEDED FOR WHEEZING OR SHORTNESS OF BREATH     Spacer/Aero Chamber Mouthpiece Misc  1 Device by Does not apply route once.      ASK your doctor about these medications        budesonide 180 MCG/ACT inhaler  Commonly known as:  PULMICORT  Inhale 1 puff into the lungs 2 (two) times daily.       Follow-up Information    Follow up with MARSHALL,BERNARD A, MD In 3 days.   Specialty:  Obstetrics and Gynecology   Why:  Keep your scheduled prenatal appointment   Contact information:   [address] [place] [address] [phone]       Eric Morganti 05/29/2015, 12:17 PM

## 2015-05-29 NOTE — MAU Note (Signed)
Pt has continued productive cough of green sputum.  Had pain in R side all last night, has continued today, also uc's.  Denies bleeding or LOF.

## 2015-05-29 NOTE — Discharge Instructions (Signed)
Asthma, Adult Asthma is a recurring condition in which the airways tighten and narrow. Asthma can make it difficult to breathe. It can cause coughing, wheezing, and shortness of breath. Asthma episodes, also called asthma attacks, range from minor to life-threatening. Asthma cannot be cured, but medicines and lifestyle changes can help control it. CAUSES Asthma is believed to be caused by inherited (genetic) and environmental factors, but its exact cause is unknown. Asthma may be triggered by allergens, lung infections, or irritants in the air. Asthma triggers are different for each person. Common triggers include:   Animal dander.  Dust mites.  Cockroaches.  Pollen from trees or grass.  Mold.  Smoke.  Air pollutants such as dust, household cleaners, hair sprays, aerosol sprays, paint fumes, strong chemicals, or strong odors.  Cold air, weather changes, and winds (which increase molds and pollens in the air).  Strong emotional expressions such as crying or laughing hard.  Stress.  Certain medicines (such as aspirin) or types of drugs (such as beta-blockers).  Sulfites in foods and drinks. Foods and drinks that may contain sulfites include dried fruit, potato chips, and sparkling grape juice.  Infections or inflammatory conditions such as the flu, a cold, or an inflammation of the nasal membranes (rhinitis).  Gastroesophageal reflux disease (GERD).  Exercise or strenuous activity. SYMPTOMS Symptoms may occur immediately after asthma is triggered or many hours later. Symptoms include:  Wheezing.  Excessive nighttime or early morning coughing.  Frequent or severe coughing with a common cold.  Chest tightness.  Shortness of breath. DIAGNOSIS  The diagnosis of asthma is made by a review of your medical history and a physical exam. Tests may also be performed. These may include:  Lung function studies. These tests show how much air you breathe in and out.  Allergy  tests.  Imaging tests such as X-rays. TREATMENT  Asthma cannot be cured, but it can usually be controlled. Treatment involves identifying and avoiding your asthma triggers. It also involves medicines. There are 2 classes of medicine used for asthma treatment:   Controller medicines. These prevent asthma symptoms from occurring. They are usually taken every day.  Reliever or rescue medicines. These quickly relieve asthma symptoms. They are used as needed and provide short-term relief. Your health care provider will help you create an asthma action plan. An asthma action plan is a written plan for managing and treating your asthma attacks. It includes a list of your asthma triggers and how they may be avoided. It also includes information on when medicines should be taken and when their dosage should be changed. An action plan may also involve the use of a device called a peak flow meter. A peak flow meter measures how well the lungs are working. It helps you monitor your condition. HOME CARE INSTRUCTIONS   Take medicines only as directed by your health care provider. Speak with your health care provider if you have questions about how or when to take the medicines.  Use a peak flow meter as directed by your health care provider. Record and keep track of readings.  Understand and use the action plan to help minimize or stop an asthma attack without needing to seek medical care.  Control your home environment in the following ways to help prevent asthma attacks:  Do not smoke. Avoid being exposed to secondhand smoke.  Change your heating and air conditioning filter regularly.  Limit your use of fireplaces and wood stoves.  Get rid of pests (such as roaches   and mice) and their droppings.  Throw away plants if you see mold on them.  Clean your floors and dust regularly. Use unscented cleaning products.  Try to have someone else vacuum for you regularly. Stay out of rooms while they are  being vacuumed and for a short while afterward. If you vacuum, use a dust mask from a hardware store, a double-layered or microfilter vacuum cleaner bag, or a vacuum cleaner with a HEPA filter.  Replace carpet with wood, tile, or vinyl flooring. Carpet can trap dander and dust.  Use allergy-proof pillows, mattress covers, and box spring covers.  Wash bed sheets and blankets every week in hot water and dry them in a dryer.  Use blankets that are made of polyester or cotton.  Clean bathrooms and kitchens with bleach. If possible, have someone repaint the walls in these rooms with mold-resistant paint. Keep out of the rooms that are being cleaned and painted.  Wash hands frequently. SEEK MEDICAL CARE IF:   You have wheezing, shortness of breath, or a cough even if taking medicine to prevent attacks.  The colored mucus you cough up (sputum) is thicker than usual.  Your sputum changes from clear or white to yellow, green, gray, or bloody.  You have any problems that may be related to the medicines you are taking (such as a rash, itching, swelling, or trouble breathing).  You are using a reliever medicine more than 2-3 times per week.  Your peak flow is still at 50-79% of your personal best after following your action plan for 1 hour.  You have a fever. SEEK IMMEDIATE MEDICAL CARE IF:   You seem to be getting worse and are unresponsive to treatment during an asthma attack.  You are short of breath even at rest.  You get short of breath when doing very little physical activity.  You have difficulty eating, drinking, or talking due to asthma symptoms.  You develop chest pain.  You develop a fast heartbeat.  You have a bluish color to your lips or fingernails.  You are light-headed, dizzy, or faint.  Your peak flow is less than 50% of your personal best.   This information is not intended to replace advice given to you by your health care provider. Make sure you discuss any  questions you have with your health care provider.   Document Released: 05/06/2005 Document Revised: 01/25/2015 Document Reviewed: 12/03/2012 Elsevier Interactive Patient Education 2016 [employer]  

## 2015-05-30 ENCOUNTER — Encounter (HOSPITAL_COMMUNITY): Payer: Self-pay

## 2015-05-30 ENCOUNTER — Other Ambulatory Visit (HOSPITAL_COMMUNITY): Payer: Medicaid Other

## 2015-05-30 ENCOUNTER — Ambulatory Visit (HOSPITAL_COMMUNITY)
Admission: RE | Admit: 2015-05-30 | Discharge: 2015-05-30 | Disposition: A | Discharge status: Home / Self Care | Payer: Medicaid Other | Source: Ambulatory Visit | Attending: Obstetrics | Admitting: Obstetrics

## 2015-05-30 ENCOUNTER — Other Ambulatory Visit (HOSPITAL_COMMUNITY): Payer: Self-pay | Admitting: Maternal and Fetal Medicine

## 2015-05-30 DIAGNOSIS — Z3A37 37 weeks gestation of pregnancy: Secondary | ICD-10-CM | POA: Diagnosis not present

## 2015-05-30 DIAGNOSIS — F17210 Nicotine dependence, cigarettes, uncomplicated: Secondary | ICD-10-CM

## 2015-05-30 DIAGNOSIS — O34219 Maternal care for unspecified type scar from previous cesarean delivery: Secondary | ICD-10-CM

## 2015-05-30 DIAGNOSIS — O10013 Pre-existing essential hypertension complicating pregnancy, third trimester: Secondary | ICD-10-CM | POA: Insufficient documentation

## 2015-05-30 DIAGNOSIS — O99333 Smoking (tobacco) complicating pregnancy, third trimester: Secondary | ICD-10-CM | POA: Insufficient documentation

## 2015-05-30 DIAGNOSIS — O163 Unspecified maternal hypertension, third trimester: Secondary | ICD-10-CM

## 2015-06-01 ENCOUNTER — Other Ambulatory Visit: Payer: Self-pay | Admitting: Obstetrics

## 2015-06-02 ENCOUNTER — Ambulatory Visit (HOSPITAL_COMMUNITY): Payer: Medicaid Other

## 2015-06-02 ENCOUNTER — Ambulatory Visit (HOSPITAL_COMMUNITY)
Admission: RE | Admit: 2015-06-02 | Discharge: 2015-06-02 | Disposition: A | Discharge status: Home / Self Care | Payer: Medicaid Other | Source: Ambulatory Visit | Attending: Obstetrics | Admitting: Obstetrics

## 2015-06-02 ENCOUNTER — Encounter (HOSPITAL_COMMUNITY): Payer: Self-pay

## 2015-06-02 DIAGNOSIS — Z3A38 38 weeks gestation of pregnancy: Secondary | ICD-10-CM | POA: Diagnosis not present

## 2015-06-02 DIAGNOSIS — O10913 Unspecified pre-existing hypertension complicating pregnancy, third trimester: Secondary | ICD-10-CM | POA: Diagnosis not present

## 2015-06-03 ENCOUNTER — Inpatient Hospital Stay (HOSPITAL_COMMUNITY)
Admission: AD | Admit: 2015-06-03 | Discharge: 2015-06-04 | Disposition: A | Discharge status: Home / Self Care | Payer: Medicaid Other | Source: Ambulatory Visit | Attending: Obstetrics | Admitting: Obstetrics

## 2015-06-03 DIAGNOSIS — G5600 Carpal tunnel syndrome, unspecified upper limb: Secondary | ICD-10-CM | POA: Insufficient documentation

## 2015-06-03 DIAGNOSIS — O26899 Other specified pregnancy related conditions, unspecified trimester: Secondary | ICD-10-CM

## 2015-06-03 DIAGNOSIS — O133 Gestational [pregnancy-induced] hypertension without significant proteinuria, third trimester: Secondary | ICD-10-CM | POA: Insufficient documentation

## 2015-06-03 DIAGNOSIS — R51 Headache: Secondary | ICD-10-CM | POA: Insufficient documentation

## 2015-06-03 DIAGNOSIS — R519 Headache, unspecified: Secondary | ICD-10-CM

## 2015-06-03 DIAGNOSIS — O10913 Unspecified pre-existing hypertension complicating pregnancy, third trimester: Secondary | ICD-10-CM

## 2015-06-03 DIAGNOSIS — O26893 Other specified pregnancy related conditions, third trimester: Secondary | ICD-10-CM | POA: Insufficient documentation

## 2015-06-03 DIAGNOSIS — R1013 Epigastric pain: Secondary | ICD-10-CM | POA: Insufficient documentation

## 2015-06-03 DIAGNOSIS — Z3A38 38 weeks gestation of pregnancy: Secondary | ICD-10-CM | POA: Insufficient documentation

## 2015-06-04 ENCOUNTER — Encounter (HOSPITAL_COMMUNITY): Payer: Self-pay | Admitting: *Deleted

## 2015-06-04 DIAGNOSIS — O133 Gestational [pregnancy-induced] hypertension without significant proteinuria, third trimester: Secondary | ICD-10-CM | POA: Diagnosis present

## 2015-06-04 DIAGNOSIS — G5600 Carpal tunnel syndrome, unspecified upper limb: Secondary | ICD-10-CM | POA: Diagnosis not present

## 2015-06-04 DIAGNOSIS — R1013 Epigastric pain: Secondary | ICD-10-CM | POA: Diagnosis not present

## 2015-06-04 DIAGNOSIS — R51 Headache: Secondary | ICD-10-CM | POA: Diagnosis not present

## 2015-06-04 DIAGNOSIS — O10913 Unspecified pre-existing hypertension complicating pregnancy, third trimester: Secondary | ICD-10-CM

## 2015-06-04 DIAGNOSIS — O26893 Other specified pregnancy related conditions, third trimester: Secondary | ICD-10-CM

## 2015-06-04 DIAGNOSIS — Z3A38 38 weeks gestation of pregnancy: Secondary | ICD-10-CM | POA: Diagnosis not present

## 2015-06-04 LAB — COMPREHENSIVE METABOLIC PANEL
ALBUMIN: 2.9 g/dL — AB (ref 3.5–5.0)
ALT: 20 U/L (ref 14–54)
AST: 26 U/L (ref 15–41)
Alkaline Phosphatase: 128 U/L — ABNORMAL HIGH (ref 38–126)
Anion gap: 9 (ref 5–15)
BUN: 10 mg/dL (ref 6–20)
CHLORIDE: 105 mmol/L (ref 101–111)
CO2: 23 mmol/L (ref 22–32)
CREATININE: 0.86 mg/dL (ref 0.44–1.00)
Calcium: 10.3 mg/dL (ref 8.9–10.3)
GFR calc Af Amer: >60 mL/min (ref >60)
GFR calc non Af Amer: >60 mL/min (ref >60)
GLUCOSE: 79 mg/dL (ref 65–99)
POTASSIUM: 3.8 mmol/L (ref 3.5–5.1)
Sodium: 137 mmol/L (ref 135–145)
Total Bilirubin: 0.4 mg/dL (ref 0.3–1.2)
Total Protein: 6 g/dL — ABNORMAL LOW (ref 6.5–8.1)

## 2015-06-04 LAB — CBC
HEMATOCRIT: 35.8 % — AB (ref 36.0–46.0)
Hemoglobin: 12 g/dL (ref 12.0–15.0)
MCH: 30.5 pg (ref 26.0–34.0)
MCHC: 33.5 g/dL (ref 30.0–36.0)
MCV: 91.1 fL (ref 78.0–100.0)
PLATELETS: 262 10*3/uL (ref 150–400)
RBC: 3.93 MIL/uL (ref 3.87–5.11)
RDW: 15 % (ref 11.5–15.5)
WBC: 11.4 10*3/uL — AB (ref 4.0–10.5)

## 2015-06-04 LAB — PROTEIN / CREATININE RATIO, URINE
Creatinine, Urine: 41 mg/dL
Protein Creatinine Ratio: 0.17 mg/mg{Cre} — ABNORMAL HIGH (ref 0.00–0.15)
Total Protein, Urine: 7 mg/dL

## 2015-06-04 LAB — URINALYSIS, ROUTINE W REFLEX MICROSCOPIC
Bilirubin Urine: NEGATIVE
Glucose, UA: NEGATIVE mg/dL
HGB URINE DIPSTICK: NEGATIVE
Ketones, ur: NEGATIVE mg/dL
LEUKOCYTES UA: NEGATIVE
Nitrite: NEGATIVE
Protein, ur: NEGATIVE mg/dL
SPECIFIC GRAVITY, URINE: 1.01 (ref 1.005–1.030)
pH: 6.5 (ref 5.0–8.0)

## 2015-06-04 LAB — URIC ACID: URIC ACID, SERUM: 5.9 mg/dL (ref 2.3–6.6)

## 2015-06-04 LAB — LACTATE DEHYDROGENASE: LDH: 137 U/L (ref 98–192)

## 2015-06-04 MED ORDER — SODIUM CHLORIDE 0.9 % IV SOLN
INTRAVENOUS | Status: DC
  Administered 2015-06-04: 02:00:00 via INTRAVENOUS

## 2015-06-04 MED ORDER — DEXAMETHASONE SODIUM PHOSPHATE 10 MG/ML IJ SOLN
10.0000 mg | Freq: Once | INTRAMUSCULAR | Status: AC
  Administered 2015-06-04: 10 mg via INTRAVENOUS
  Filled 2015-06-04: qty 1

## 2015-06-04 MED ORDER — METOCLOPRAMIDE HCL 5 MG/ML IJ SOLN
10.0000 mg | Freq: Once | INTRAMUSCULAR | Status: AC
  Administered 2015-06-04: 10 mg via INTRAVENOUS
  Filled 2015-06-04: qty 2

## 2015-06-04 MED ORDER — DIPHENHYDRAMINE HCL 50 MG/ML IJ SOLN
25.0000 mg | Freq: Once | INTRAMUSCULAR | Status: AC
  Administered 2015-06-04: 25 mg via INTRAVENOUS
  Filled 2015-06-04: qty 1

## 2015-06-04 NOTE — MAU Provider Note (Signed)
History     CSN: [id number]  Arrival date and time: 06/03/15 2345   First Provider Initiated Contact with Patient 06/04/15 0027          Chief Complaint  Patient presents with  . Blurred Vision  . Wrist Pain  . Headache   HPI [name] is a 35 y.o. M5H8469 at [redacted]w[redacted]d who presents with blurred vision, headache & wrist pain.  Has chronic hypertension currently taking labetalol. Last took at 1130 pm.  Reports blurred vision in left eye since 6 pm tonight. Also seeing some floaters which is normal for her at night.  Frontal headache all day. Throbbing pain that rates 8/10. Lights make pain worse. Took 1 gm tylenol at 7 pm with no relief. Right epigastric pain tonight that she describes as constant and "tingly".  Denies chest pain, SOB, palpitations, contractions, LOF, or vaginal bleeding. Positive fetal movement.   Also reports bilateral wrist pain & hand numbness today. Symptoms have improved in right hand but remained in left hand.    OB History    Gravida Para Term Preterm AB TAB SAB Ectopic Multiple Living   4 1 1  0 2 1 1  0 0 1      Past Medical History  Diagnosis Date  . Asthma   . Hypertension   . Anxiety   . Pregnancy induced hypertension   . Kidney stones   . Ovarian cyst   . Vaginal Pap smear, abnormal     bx, f/u ok  . Depression     doing good, not on meds now  . Missed abortion 10/02/2013  . Headache     migraines    Past Surgical History  Procedure Laterality Date  . Cesarean section    . Dilation and curettage of uterus    . Dilation and evacuation N/A 10/04/2013    Procedure: DILATATION AND EVACUATION;  Surgeon: Sherian Rein, MD;  Location: [facility];  Service: Gynecology;  Laterality: N/A;  [place] in room     Family History  Problem Relation Age of Onset  . Hearing loss Mother   . Asthma Mother   . Diabetes Son   . Cancer Maternal Grandfather     bone  . Liver disease Maternal Grandfather   . Asthma Father   . Asthma Sister   .  Lupus Sister     Social History  Substance Use Topics  . Smoking status: Current Some Day Smoker -- 0.50 packs/day for 15 years    Types: Cigarettes  . Smokeless tobacco: Never Used     Comment: Smoking .5 ppd  . Alcohol Use: No     Comment: very occassionally    Allergies: No Known Allergies  Prescriptions prior to admission  Medication Sig Dispense Refill Last Dose  . azithromycin (ZITHROMAX Z-PAK) 250 MG tablet Take 2 tabs po at once for 1 day, then 1 tab po per day x 4days 6 tablet 0 Taking  . beclomethasone (QVAR) 80 MCG/ACT inhaler Inhale 2 puffs into the lungs 2 (two) times daily.   Taking  . calcium carbonate (TUMS - DOSED IN MG ELEMENTAL CALCIUM) 500 MG chewable tablet Chew 1 tablet by mouth 4 (four) times daily as needed for indigestion or heartburn.   Taking  . guaiFENesin-dextromethorphan (ROBITUSSIN DM) 100-10 MG/5ML syrup Take 5 mLs by mouth every 4 (four) hours as needed for cough. 118 mL 0 Taking  . labetalol (NORMODYNE) 200 MG tablet Take 200 mg by mouth 4 (four) times daily.  Taking  . oxyCODONE-acetaminophen (PERCOCET/ROXICET) 5-325 MG tablet Take by mouth every 6 (six) hours as needed.  0   . Prenat w/o A Vit-FeFum-FePo-FA (CONCEPT OB) 130-92.4-1 MG CAPS Take 1 tablet by mouth daily. 30 capsule 11 Taking  . PROAIR HFA 108 (90 BASE) MCG/ACT inhaler USE 2 PUFFS EVERY 2 HOURS AS NEEDED FOR WHEEZING OR SHORTNESS OF BREATH 8.5 Inhaler 3 Taking  . PULMICORT FLEXHALER 180 MCG/ACT inhaler INHALE 1 PUFF INTO THE LUNGS TWICE DAILY  2   . Spacer/Aero Chamber Mouthpiece MISC 1 Device by Does not apply route once. 1 each 3 Taking    Review of Systems  Constitutional: Negative.   Eyes: Positive for blurred vision and photophobia. Negative for pain.  Respiratory: Negative for shortness of breath.   Cardiovascular: Negative for chest pain and palpitations.  Gastrointestinal: Positive for abdominal pain (epigastric pain) and diarrhea (3 episodes of loose stool this morning).  Negative for nausea, vomiting and constipation.  Musculoskeletal:       + left wrist pain  Neurological: Positive for headaches. Negative for dizziness and loss of consciousness.   Physical Exam   Blood pressure 139/79, pulse 74, temperature 97.7 F (36.5 C), resp. rate 18, height 5\' 4"  (1.626 m), weight 216 lb 6.4 oz (98.158 kg), last menstrual period 09/02/2014.  Patient Vitals for the past 24 hrs:  BP Temp Pulse Resp Height Weight  06/04/15 0134 132/86 mmHg - 73 - - -  06/04/15 0034 147/92 mmHg - 73 - - -  06/04/15 0019 139/79 mmHg - 74 - - -  06/04/15 0016 141/83 mmHg - 79 - - -  06/03/15 2357 - 97.7 F (36.5 C) - - - -  06/03/15 2356 144/95 mmHg - 81 18 5\' 4"  (1.626 m) 216 lb 6.4 oz (98.158 kg)     Physical Exam  Nursing note and vitals reviewed. Constitutional: She is oriented to person, place, and time. She appears well-developed and well-nourished. No distress.  HENT:  Head: Normocephalic and atraumatic.  Eyes: Conjunctivae and EOM are normal. Pupils are equal, round, and reactive to light. Right eye exhibits no discharge. Left eye exhibits no discharge. No scleral icterus.  Cardiovascular: Normal rate, regular rhythm and normal heart sounds.   No murmur heard. Respiratory: Effort normal and breath sounds normal. No respiratory distress. She has no wheezes.  GI: Soft. There is no tenderness.  Musculoskeletal: Normal range of motion. She exhibits no edema or tenderness.  Neurological: She is alert and oriented to person, place, and time. She has normal strength. She displays normal reflexes. No cranial nerve deficit.  No clonus  Skin: Skin is warm and dry. She is not diaphoretic.  Psychiatric: She has a normal mood and affect. Her behavior is normal. Judgment and thought content normal.   Fetal Tracing:  Baseline: 140 Variability: moderate Accelerations: 15x15 Decelerations: none  Toco: none   MAU Course  Procedures Results for orders placed or performed  during the hospital encounter of 06/03/15 (from the past 24 hour(s))  Urinalysis, Routine w reflex microscopic (not at [facility])     Status: Abnormal   Collection Time: 06/04/15 12:05 AM  Result Value Ref Range   Color, Urine YELLOW YELLOW   APPearance HAZY (A) CLEAR   Specific Gravity, Urine 1.010 1.005 - 1.030   pH 6.5 5.0 - 8.0   Glucose, UA NEGATIVE NEGATIVE mg/dL   Hgb urine dipstick NEGATIVE NEGATIVE   Bilirubin Urine NEGATIVE NEGATIVE   Ketones, ur NEGATIVE NEGATIVE mg/dL  Protein, ur NEGATIVE NEGATIVE mg/dL   Nitrite NEGATIVE NEGATIVE   Leukocytes, UA NEGATIVE NEGATIVE  Protein / creatinine ratio, urine     Status: Abnormal   Collection Time: 06/04/15 12:05 AM  Result Value Ref Range   Creatinine, Urine 41.00 mg/dL   Total Protein, Urine 7 mg/dL   Protein Creatinine Ratio 0.17 (H) 0.00 - 0.15 mg/mg[Cre]  CBC     Status: Abnormal   Collection Time: 06/04/15 12:20 AM  Result Value Ref Range   WBC 11.4 (H) 4.0 - 10.5 K/uL   RBC 3.93 3.87 - 5.11 MIL/uL   Hemoglobin 12.0 12.0 - 15.0 g/dL   HCT 16.1 (L) 09.6 - 04.5 %   MCV 91.1 78.0 - 100.0 fL   MCH 30.5 26.0 - 34.0 pg   MCHC 33.5 30.0 - 36.0 g/dL   RDW 40.9 81.1 - 91.4 %   Platelets 262 150 - 400 K/uL  Comprehensive metabolic panel     Status: Abnormal   Collection Time: 06/04/15 12:20 AM  Result Value Ref Range   Sodium 137 135 - 145 mmol/L   Potassium 3.8 3.5 - 5.1 mmol/L   Chloride 105 101 - 111 mmol/L   CO2 23 22 - 32 mmol/L   Glucose, Bld 79 65 - 99 mg/dL   BUN 10 6 - 20 mg/dL   Creatinine, Ser 7.82 0.44 - 1.00 mg/dL   Calcium 95.6 8.9 - 21.3 mg/dL   Total Protein 6.0 (L) 6.5 - 8.1 g/dL   Albumin 2.9 (L) 3.5 - 5.0 g/dL   AST 26 15 - 41 U/L   ALT 20 14 - 54 U/L   Alkaline Phosphatase 128 (H) 38 - 126 U/L   Total Bilirubin 0.4 0.3 - 1.2 mg/dL   GFR calc non Af Amer >60 >60 mL/min   GFR calc Af Amer >60 >60 mL/min   Anion gap 9 5 - 15  Lactate dehydrogenase     Status: None   Collection Time: 06/04/15 12:20 AM   Result Value Ref Range   LDH 137 98 - 192 U/L  Uric acid     Status: None   Collection Time: 06/04/15 12:20 AM  Result Value Ref Range   Uric Acid, Serum 5.9 2.3 - 6.6 mg/dL    MDM Neuro exam normal. PERRLA No severe range BPs Headaches resolved with IV fluids & benadryl/reglan/decadron Labs normal 0240- S/w Dr. Gaynell Face. Discussed pt complaints, assessment, & labs. Ok to discharge home.   Assessment and Plan  A: 1. Chronic hypertension in pregnancy, third trimester   2. Carpal tunnel syndrome during pregnancy   3. Headache in pregnancy, antepartum, third trimester    P: Discharge home Wear wrist splints at night Take tylenol prn pain Discussed reasons to return to MAU  Judeth Horn, NP   06/04/2015, 12:27 AM

## 2015-06-04 NOTE — Discharge Instructions (Signed)
Hypertension During Pregnancy °Hypertension, or high blood pressure, is when there is extra pressure inside your blood vessels that carry blood from the heart to the rest of your body (arteries). It can happen at any time in life, including pregnancy. Hypertension during pregnancy can cause problems for you and your baby. Your baby might not weigh as much as he or she should at birth or might be born early (premature). Very bad cases of hypertension during pregnancy can be life-threatening.  °Different types of hypertension can occur during pregnancy. These include: °· Chronic hypertension. This happens when a woman has hypertension before pregnancy and it continues during pregnancy. °· Gestational hypertension. This is when hypertension develops during pregnancy. °· Preeclampsia or toxemia of pregnancy. This is a very serious type of hypertension that develops only during pregnancy. It affects the whole body and can be very dangerous for both mother and baby.   °Gestational hypertension and preeclampsia usually go away after your baby is born. Your blood pressure will likely stabilize within 6 weeks. Women who have hypertension during pregnancy have a greater chance of developing hypertension later in life or with future pregnancies. °RISK FACTORS °There are certain factors that make it more likely for you to develop hypertension during pregnancy. These include: °· Having hypertension before pregnancy. °· Having hypertension during a previous pregnancy. °· Being overweight. °· Being older than 40 years. °· Being pregnant with more than one baby. °· Having diabetes or kidney problems. °SIGNS AND SYMPTOMS °Chronic and gestational hypertension rarely cause symptoms. Preeclampsia has symptoms, which may include: °· Increased protein in your urine. Your health care provider will check for this at every prenatal visit. °· Swelling of your hands and face. °· Rapid weight gain. °· Headaches. °· Visual changes. °· Being  bothered by light. °· Abdominal pain, especially in the upper right area. °· Chest pain. °· Shortness of breath. °· Increased reflexes. °· Seizures. These occur with a more severe form of preeclampsia, called eclampsia. °DIAGNOSIS  °You may be diagnosed with hypertension during a regular prenatal exam. At each prenatal visit, you may have: °· Your blood pressure checked. °· A urine test to check for protein in your urine. °The type of hypertension you are diagnosed with depends on when you developed it. It also depends on your specific blood pressure reading. °· Developing hypertension before 20 weeks of pregnancy is consistent with chronic hypertension. °· Developing hypertension after 20 weeks of pregnancy is consistent with gestational hypertension. °· Hypertension with increased urinary protein is diagnosed as preeclampsia. °· Blood pressure measurements that stay above 160 systolic or 110 diastolic are a sign of severe preeclampsia. °TREATMENT °Treatment for hypertension during pregnancy varies. Treatment depends on the type of hypertension and how serious it is. °· If you take medicine for chronic hypertension, you may need to switch medicines. °¨ Medicines called ACE inhibitors should not be taken during pregnancy. °¨ Low-dose aspirin may be suggested for women who have risk factors for preeclampsia. °· If you have gestational hypertension, you may need to take a blood pressure medicine that is safe during pregnancy. Your health care provider will recommend the correct medicine. °· If you have severe preeclampsia, you may need to be in the hospital. Health care providers will watch you and your baby very closely. You also may need to take medicine called magnesium sulfate to prevent seizures and lower blood pressure. °· Sometimes, an early delivery is needed. This may be the case if the condition worsens. It would be   done to protect you and your baby. The only cure for preeclampsia is delivery.  Your health  care provider may recommend that you take one low-dose aspirin (81 mg) each day to help prevent high blood pressure during your pregnancy if you are at risk for preeclampsia. You may be at risk for preeclampsia if:  You had preeclampsia or eclampsia during a previous pregnancy.  Your baby did not grow as expected during a previous pregnancy.  You experienced preterm birth with a previous pregnancy.  You experienced a separation of the placenta from the uterus (placental abruption) during a previous pregnancy.  You experienced the loss of your baby during a previous pregnancy.  You are pregnant with more than one baby.  You have other medical conditions, such as diabetes or an autoimmune disease. HOME CARE INSTRUCTIONS  Schedule and keep all of your regular prenatal care appointments. This is important.  Take medicines only as directed by your health care provider. Tell your health care provider about all medicines you take.  Eat as little salt as possible.  Get regular exercise.  Do not drink alcohol.  Do not use tobacco products.  Do not drink products with caffeine.  Lie on your left side when resting. SEEK IMMEDIATE MEDICAL CARE IF:  You have severe abdominal pain.  You have sudden swelling in your hands, ankles, or face.  You gain 4 pounds (1.8 kg) or more in 1 week.  You vomit repeatedly.  You have vaginal bleeding.  You do not feel your baby moving as much.  You have a headache.  You have blurred or double vision.  You have muscle twitching or spasms.  You have shortness of breath.  You have blue fingernails or lips.  You have blood in your urine. MAKE SURE YOU:  Understand these instructions.  Will watch your condition.  Will get help right away if you are not doing well or get worse.   This information is not intended to replace advice given to you by your health care provider. Make sure you discuss any questions you have with your health care  provider.   Document Released: 01/22/2011 Document Revised: 05/27/2014 Document Reviewed: 12/03/2012 Elsevier Interactive Patient Education 2016 [employer]    Carpal Tunnel Syndrome Carpal tunnel syndrome is a condition that causes pain in your hand and arm. The carpal tunnel is a narrow area that is on the palm side of your wrist. Repeated wrist motion or certain diseases may cause swelling in the tunnel. This swelling can pinch the main nerve in the wrist (median nerve).  HOME CARE If You Have a Splint:  Wear it as told by your doctor. Remove it only as told by your doctor.  Loosen the splint if your fingers:  Become numb and tingle.  Turn blue and cold.  Keep the splint clean and dry. General Instructions  Take over-the-counter and prescription medicines only as told by your doctor.  Rest your wrist from any activity that may be causing your pain. If needed, talk to your employer about changes that can be made in your work, such as getting a wrist pad to use while typing.  If directed, apply ice to the painful area:  Put ice in a plastic bag.  Place a towel between your skin and the bag.  Leave the ice on for 20 minutes, 2-3 times per day.  Keep all follow-up visits as told by your doctor. This is important.  Do any exercises as told by  your doctor, physical therapist, or occupational therapist. GET HELP IF:  You have new symptoms.  Medicine does not help your pain.  Your symptoms get worse.   This information is not intended to replace advice given to you by your health care provider. Make sure you discuss any questions you have with your health care provider.   Document Released: 04/25/2011 Document Revised: 01/25/2015 Document Reviewed: 09/21/2014 Elsevier Interactive Patient Education [employer]

## 2015-06-04 NOTE — MAU Note (Signed)
Blurred vision L eye couple hours ago and continues. Chronic htn. Both hands numb earlier but now just left hand. Diarrhea x3 today. No vomiting. Headache in back of head (hx migraines). dizziness

## 2015-06-06 ENCOUNTER — Inpatient Hospital Stay (HOSPITAL_COMMUNITY): Payer: Medicaid Other | Admitting: Certified Registered Nurse Anesthetist

## 2015-06-06 ENCOUNTER — Inpatient Hospital Stay (HOSPITAL_COMMUNITY)
Admission: AD | Admit: 2015-06-06 | Discharge: 2015-06-09 | DRG: 765 | Disposition: A | Discharge status: Home / Self Care | Payer: Medicaid Other | Source: Ambulatory Visit | Attending: Obstetrics | Admitting: Obstetrics

## 2015-06-06 ENCOUNTER — Encounter (HOSPITAL_COMMUNITY): Payer: Self-pay

## 2015-06-06 ENCOUNTER — Encounter (HOSPITAL_COMMUNITY)
Admission: AD | Disposition: A | Discharge status: Home / Self Care | Payer: Self-pay | Source: Ambulatory Visit | Attending: Obstetrics

## 2015-06-06 ENCOUNTER — Ambulatory Visit (HOSPITAL_COMMUNITY)
Admission: RE | Admit: 2015-06-06 | Discharge: 2015-06-06 | Disposition: A | Discharge status: Home / Self Care | Payer: Medicaid Other | Source: Ambulatory Visit | Attending: Obstetrics | Admitting: Obstetrics

## 2015-06-06 ENCOUNTER — Other Ambulatory Visit (HOSPITAL_COMMUNITY): Payer: Self-pay | Admitting: Maternal and Fetal Medicine

## 2015-06-06 ENCOUNTER — Encounter (HOSPITAL_COMMUNITY): Payer: Self-pay | Admitting: *Deleted

## 2015-06-06 DIAGNOSIS — O283 Abnormal ultrasonic finding on antenatal screening of mother: Secondary | ICD-10-CM

## 2015-06-06 DIAGNOSIS — Z3A38 38 weeks gestation of pregnancy: Secondary | ICD-10-CM

## 2015-06-06 DIAGNOSIS — O288 Other abnormal findings on antenatal screening of mother: Secondary | ICD-10-CM

## 2015-06-06 DIAGNOSIS — O99824 Streptococcus B carrier state complicating childbirth: Secondary | ICD-10-CM | POA: Diagnosis present

## 2015-06-06 DIAGNOSIS — O99333 Smoking (tobacco) complicating pregnancy, third trimester: Secondary | ICD-10-CM | POA: Diagnosis present

## 2015-06-06 DIAGNOSIS — O368130 Decreased fetal movements, third trimester, not applicable or unspecified: Secondary | ICD-10-CM

## 2015-06-06 DIAGNOSIS — Z302 Encounter for sterilization: Secondary | ICD-10-CM | POA: Diagnosis not present

## 2015-06-06 DIAGNOSIS — O163 Unspecified maternal hypertension, third trimester: Secondary | ICD-10-CM

## 2015-06-06 DIAGNOSIS — O34211 Maternal care for low transverse scar from previous cesarean delivery: Principal | ICD-10-CM | POA: Diagnosis present

## 2015-06-06 DIAGNOSIS — O34219 Maternal care for unspecified type scar from previous cesarean delivery: Secondary | ICD-10-CM

## 2015-06-06 DIAGNOSIS — O1092 Unspecified pre-existing hypertension complicating childbirth: Secondary | ICD-10-CM | POA: Diagnosis present

## 2015-06-06 DIAGNOSIS — O10013 Pre-existing essential hypertension complicating pregnancy, third trimester: Secondary | ICD-10-CM | POA: Insufficient documentation

## 2015-06-06 DIAGNOSIS — J45909 Unspecified asthma, uncomplicated: Secondary | ICD-10-CM | POA: Diagnosis present

## 2015-06-06 DIAGNOSIS — O10019 Pre-existing essential hypertension complicating pregnancy, unspecified trimester: Secondary | ICD-10-CM

## 2015-06-06 DIAGNOSIS — Z98891 History of uterine scar from previous surgery: Secondary | ICD-10-CM

## 2015-06-06 DIAGNOSIS — O9952 Diseases of the respiratory system complicating childbirth: Secondary | ICD-10-CM | POA: Diagnosis present

## 2015-06-06 LAB — CBC
HEMATOCRIT: 35.2 % — AB (ref 36.0–46.0)
HEMOGLOBIN: 11.7 g/dL — AB (ref 12.0–15.0)
MCH: 30.4 pg (ref 26.0–34.0)
MCHC: 33.2 g/dL (ref 30.0–36.0)
MCV: 91.4 fL (ref 78.0–100.0)
Platelets: 249 10*3/uL (ref 150–400)
RBC: 3.85 MIL/uL — ABNORMAL LOW (ref 3.87–5.11)
RDW: 15.2 % (ref 11.5–15.5)
WBC: 10.4 10*3/uL (ref 4.0–10.5)

## 2015-06-06 LAB — TYPE AND SCREEN
ABO/RH(D): O NEG
ANTIBODY SCREEN: NEGATIVE

## 2015-06-06 SURGERY — Surgical Case
Anesthesia: Spinal

## 2015-06-06 MED ORDER — DIPHENHYDRAMINE HCL 50 MG/ML IJ SOLN
INTRAMUSCULAR | Status: AC
  Administered 2015-06-06: 12.5 mg via INTRAVENOUS
  Filled 2015-06-06: qty 1

## 2015-06-06 MED ORDER — SIMETHICONE 80 MG PO CHEW: 80.0000 mg | CHEWABLE_TABLET | ORAL | Status: DC | PRN

## 2015-06-06 MED ORDER — MENTHOL 3 MG MT LOZG: 1.0000 | LOZENGE | OROMUCOSAL | Status: DC | PRN

## 2015-06-06 MED ORDER — NALBUPHINE HCL 10 MG/ML IJ SOLN: 5.0000 mg | Freq: Once | INTRAMUSCULAR | Status: DC | PRN

## 2015-06-06 MED ORDER — DIPHENHYDRAMINE HCL 50 MG/ML IJ SOLN
12.5000 mg | INTRAMUSCULAR | Status: DC | PRN
  Administered 2015-06-06: 12.5 mg via INTRAVENOUS

## 2015-06-06 MED ORDER — DIPHENHYDRAMINE HCL 25 MG PO CAPS: 25.0000 mg | ORAL_CAPSULE | Freq: Four times a day (QID) | ORAL | Status: DC | PRN

## 2015-06-06 MED ORDER — SCOPOLAMINE 1 MG/3DAYS TD PT72
1.0000 | MEDICATED_PATCH | Freq: Once | TRANSDERMAL | Status: DC
Start: 2015-06-06 — End: 2015-06-09

## 2015-06-06 MED ORDER — MORPHINE SULFATE (PF) 0.5 MG/ML IJ SOLN
INTRAMUSCULAR | Status: DC | PRN
  Administered 2015-06-06: .1 mg via INTRATHECAL

## 2015-06-06 MED ORDER — FENTANYL CITRATE (PF) 100 MCG/2ML IJ SOLN: 25.0000 ug | INTRAMUSCULAR | Status: DC | PRN

## 2015-06-06 MED ORDER — CEFAZOLIN SODIUM-DEXTROSE 2-3 GM-% IV SOLR
INTRAVENOUS | Status: AC
  Filled 2015-06-06: qty 50

## 2015-06-06 MED ORDER — DIPHENHYDRAMINE HCL 25 MG PO CAPS
25.0000 mg | ORAL_CAPSULE | ORAL | Status: DC | PRN
  Administered 2015-06-07: 25 mg via ORAL
  Filled 2015-06-06: qty 1

## 2015-06-06 MED ORDER — LACTATED RINGERS IV SOLN
INTRAVENOUS | Status: DC
  Administered 2015-06-06 (×4): via INTRAVENOUS

## 2015-06-06 MED ORDER — FLEET ENEMA 7-19 GM/118ML RE ENEM: 1.0000 | ENEMA | RECTAL | Status: DC | PRN

## 2015-06-06 MED ORDER — NALOXONE HCL 2 MG/2ML IJ SOSY: 1.0000 ug/kg/h | PREFILLED_SYRINGE | INTRAVENOUS | Status: DC | PRN

## 2015-06-06 MED ORDER — OXYTOCIN BOLUS FROM INFUSION: 500.0000 mL | INTRAVENOUS | Status: DC

## 2015-06-06 MED ORDER — NALOXONE HCL 0.4 MG/ML IJ SOLN: 0.4000 mg | INTRAMUSCULAR | Status: DC | PRN

## 2015-06-06 MED ORDER — ONDANSETRON HCL 4 MG/2ML IJ SOLN
INTRAMUSCULAR | Status: AC
  Filled 2015-06-06: qty 2

## 2015-06-06 MED ORDER — NALBUPHINE HCL 10 MG/ML IJ SOLN: 5.0000 mg | INTRAMUSCULAR | Status: DC | PRN

## 2015-06-06 MED ORDER — ONDANSETRON HCL 4 MG/2ML IJ SOLN: 4.0000 mg | Freq: Three times a day (TID) | INTRAMUSCULAR | Status: DC | PRN

## 2015-06-06 MED ORDER — SIMETHICONE 80 MG PO CHEW
80.0000 mg | CHEWABLE_TABLET | ORAL | Status: DC
  Administered 2015-06-07 – 2015-06-08 (×3): 80 mg via ORAL
  Filled 2015-06-06 (×3): qty 1

## 2015-06-06 MED ORDER — SIMETHICONE 80 MG PO CHEW
80.0000 mg | CHEWABLE_TABLET | Freq: Three times a day (TID) | ORAL | Status: DC
  Administered 2015-06-07 – 2015-06-09 (×6): 80 mg via ORAL
  Filled 2015-06-06 (×7): qty 1

## 2015-06-06 MED ORDER — CITRIC ACID-SODIUM CITRATE 334-500 MG/5ML PO SOLN
30.0000 mL | ORAL | Status: DC | PRN
  Administered 2015-06-06: 30 mL via ORAL
  Filled 2015-06-06: qty 15

## 2015-06-06 MED ORDER — PHENYLEPHRINE 8 MG IN D5W 100 ML (0.08MG/ML) PREMIX OPTIME
INJECTION | INTRAVENOUS | Status: DC | PRN
  Administered 2015-06-06: 60 ug/min via INTRAVENOUS

## 2015-06-06 MED ORDER — ACETAMINOPHEN 325 MG PO TABS: 650.0000 mg | ORAL_TABLET | ORAL | Status: DC | PRN

## 2015-06-06 MED ORDER — PENICILLIN G POTASSIUM 5000000 UNITS IJ SOLR
5.0000 10*6.[IU] | Freq: Once | INTRAVENOUS | Status: DC
  Filled 2015-06-06: qty 5

## 2015-06-06 MED ORDER — WITCH HAZEL-GLYCERIN EX PADS: 1.0000 "application " | MEDICATED_PAD | CUTANEOUS | Status: DC | PRN

## 2015-06-06 MED ORDER — LACTATED RINGERS IV SOLN
INTRAVENOUS | Status: DC | PRN
  Administered 2015-06-06 (×2): via INTRAVENOUS

## 2015-06-06 MED ORDER — PRENATAL MULTIVITAMIN CH
1.0000 | ORAL_TABLET | Freq: Every day | ORAL | Status: DC
  Administered 2015-06-07 – 2015-06-08 (×2): 1 via ORAL
  Filled 2015-06-06 (×2): qty 1

## 2015-06-06 MED ORDER — IBUPROFEN 600 MG PO TABS
600.0000 mg | ORAL_TABLET | Freq: Four times a day (QID) | ORAL | Status: DC
  Administered 2015-06-06 – 2015-06-09 (×10): 600 mg via ORAL
  Filled 2015-06-06 (×10): qty 1

## 2015-06-06 MED ORDER — OXYTOCIN 10 UNIT/ML IJ SOLN: 2.5000 [IU]/h | INTRAVENOUS | Status: AC

## 2015-06-06 MED ORDER — SODIUM CHLORIDE 0.9 % IJ SOLN: 3.0000 mL | INTRAMUSCULAR | Status: DC | PRN

## 2015-06-06 MED ORDER — ACETAMINOPHEN 500 MG PO TABS: 1000.0000 mg | ORAL_TABLET | Freq: Four times a day (QID) | ORAL | Status: AC

## 2015-06-06 MED ORDER — ACETAMINOPHEN 160 MG/5ML PO SOLN: 975.0000 mg | Freq: Once | ORAL | Status: DC

## 2015-06-06 MED ORDER — OXYCODONE-ACETAMINOPHEN 5-325 MG PO TABS
1.0000 | ORAL_TABLET | Freq: Four times a day (QID) | ORAL | Status: DC | PRN
  Administered 2015-06-07: 1 via ORAL
  Filled 2015-06-06 (×2): qty 1

## 2015-06-06 MED ORDER — OXYTOCIN 10 UNIT/ML IJ SOLN
INTRAMUSCULAR | Status: AC
  Filled 2015-06-06: qty 4

## 2015-06-06 MED ORDER — SCOPOLAMINE 1 MG/3DAYS TD PT72
MEDICATED_PATCH | TRANSDERMAL | Status: DC | PRN
  Administered 2015-06-06: 1 via TRANSDERMAL

## 2015-06-06 MED ORDER — MORPHINE SULFATE (PF) 0.5 MG/ML IJ SOLN
INTRAMUSCULAR | Status: AC
  Filled 2015-06-06: qty 10

## 2015-06-06 MED ORDER — FENTANYL CITRATE (PF) 100 MCG/2ML IJ SOLN
INTRAMUSCULAR | Status: AC
  Filled 2015-06-06: qty 2

## 2015-06-06 MED ORDER — LABETALOL HCL 200 MG PO TABS
200.0000 mg | ORAL_TABLET | Freq: Four times a day (QID) | ORAL | Status: DC
  Administered 2015-06-06 – 2015-06-09 (×11): 200 mg via ORAL
  Filled 2015-06-06 (×16): qty 1

## 2015-06-06 MED ORDER — EPHEDRINE 5 MG/ML INJ
INTRAVENOUS | Status: AC
  Filled 2015-06-06: qty 10

## 2015-06-06 MED ORDER — LANOLIN HYDROUS EX OINT
1.0000 | TOPICAL_OINTMENT | CUTANEOUS | Status: DC | PRN
Start: 2015-06-06 — End: 2015-06-09

## 2015-06-06 MED ORDER — SODIUM CHLORIDE 0.9 % IR SOLN
Status: DC | PRN
  Administered 2015-06-06: 1000 mL

## 2015-06-06 MED ORDER — PENICILLIN G POTASSIUM 5000000 UNITS IJ SOLR
2.5000 10*6.[IU] | INTRAVENOUS | Status: DC
  Filled 2015-06-06 (×3): qty 2.5

## 2015-06-06 MED ORDER — MEPERIDINE HCL 25 MG/ML IJ SOLN: 6.2500 mg | INTRAMUSCULAR | Status: DC | PRN

## 2015-06-06 MED ORDER — TETANUS-DIPHTH-ACELL PERTUSSIS 5-2.5-18.5 LF-MCG/0.5 IM SUSP
0.5000 mL | Freq: Once | INTRAMUSCULAR | Status: AC
  Administered 2015-06-07: 0.5 mL via INTRAMUSCULAR
  Filled 2015-06-06: qty 0.5

## 2015-06-06 MED ORDER — ZOLPIDEM TARTRATE 5 MG PO TABS: 5.0000 mg | ORAL_TABLET | Freq: Every evening | ORAL | Status: DC | PRN

## 2015-06-06 MED ORDER — OXYTOCIN 10 UNIT/ML IJ SOLN
40.0000 [IU] | INTRAVENOUS | Status: DC | PRN
  Administered 2015-06-06: 40 [IU] via INTRAVENOUS

## 2015-06-06 MED ORDER — PHENYLEPHRINE 8 MG IN D5W 100 ML (0.08MG/ML) PREMIX OPTIME
INJECTION | INTRAVENOUS | Status: AC
  Filled 2015-06-06: qty 100

## 2015-06-06 MED ORDER — SENNOSIDES-DOCUSATE SODIUM 8.6-50 MG PO TABS
2.0000 | ORAL_TABLET | ORAL | Status: DC
  Administered 2015-06-07 – 2015-06-08 (×3): 2 via ORAL
  Filled 2015-06-06 (×3): qty 2

## 2015-06-06 MED ORDER — TERBUTALINE SULFATE 1 MG/ML IJ SOLN: 0.2500 mg | Freq: Once | INTRAMUSCULAR | Status: DC | PRN

## 2015-06-06 MED ORDER — LIDOCAINE HCL (PF) 1 % IJ SOLN: 30.0000 mL | INTRAMUSCULAR | Status: DC | PRN

## 2015-06-06 MED ORDER — DEXTROSE 5 % IV SOLN
3.0000 g | Freq: Once | INTRAVENOUS | Status: DC
  Filled 2015-06-06: qty 3000

## 2015-06-06 MED ORDER — LACTATED RINGERS IV SOLN
INTRAVENOUS | Status: DC
  Administered 2015-06-07: 04:00:00 via INTRAVENOUS

## 2015-06-06 MED ORDER — DIBUCAINE 1 % RE OINT: 1.0000 "application " | TOPICAL_OINTMENT | RECTAL | Status: DC | PRN

## 2015-06-06 MED ORDER — OXYTOCIN 10 UNIT/ML IJ SOLN: 2.5000 [IU]/h | INTRAVENOUS | Status: DC

## 2015-06-06 MED ORDER — SODIUM CHLORIDE 0.9 % IV SOLN
2.0000 g | Freq: Once | INTRAVENOUS | Status: DC
  Filled 2015-06-06: qty 2000

## 2015-06-06 MED ORDER — INFLUENZA VAC SPLIT QUAD 0.5 ML IM SUSY
0.5000 mL | PREFILLED_SYRINGE | INTRAMUSCULAR | Status: AC
  Administered 2015-06-07: 0.5 mL via INTRAMUSCULAR
  Filled 2015-06-06: qty 0.5

## 2015-06-06 MED ORDER — PNEUMOCOCCAL VAC POLYVALENT 25 MCG/0.5ML IJ INJ
0.5000 mL | INJECTION | INTRAMUSCULAR | Status: AC
  Administered 2015-06-08: 0.5 mL via INTRAMUSCULAR
  Filled 2015-06-06 (×2): qty 0.5

## 2015-06-06 MED ORDER — FENTANYL CITRATE (PF) 100 MCG/2ML IJ SOLN: 50.0000 ug | INTRAMUSCULAR | Status: DC | PRN

## 2015-06-06 MED ORDER — ACETAMINOPHEN 325 MG PO TABS
650.0000 mg | ORAL_TABLET | ORAL | Status: DC | PRN
  Administered 2015-06-06: 650 mg via ORAL
  Filled 2015-06-06: qty 2

## 2015-06-06 MED ORDER — CEFAZOLIN SODIUM-DEXTROSE 2-3 GM-% IV SOLR
INTRAVENOUS | Status: DC | PRN
  Administered 2015-06-06: 3 g via INTRAVENOUS
  Administered 2015-06-06: 2 g via INTRAVENOUS

## 2015-06-06 MED ORDER — ONDANSETRON HCL 4 MG/2ML IJ SOLN
INTRAMUSCULAR | Status: DC | PRN
  Administered 2015-06-06: 4 mg via INTRAVENOUS

## 2015-06-06 MED ORDER — FENTANYL CITRATE (PF) 100 MCG/2ML IJ SOLN
INTRAMUSCULAR | Status: DC | PRN
  Administered 2015-06-06: 25 ug via INTRATHECAL

## 2015-06-06 MED ORDER — LACTATED RINGERS IV SOLN: 500.0000 mL | INTRAVENOUS | Status: DC | PRN

## 2015-06-06 SURGICAL SUPPLY — 36 items
CLAMP CORD UMBIL (MISCELLANEOUS) IMPLANT
CLOTH BEACON ORANGE TIMEOUT ST (SAFETY) ×2 IMPLANT
DRAPE SHEET LG 3/4 BI-LAMINATE (DRAPES) IMPLANT
DRSG OPSITE POSTOP 4X10 (GAUZE/BANDAGES/DRESSINGS) ×2 IMPLANT
DRSG OPSITE POSTOP 4X12 (GAUZE/BANDAGES/DRESSINGS) ×1 IMPLANT
DURAPREP 26ML APPLICATOR (WOUND CARE) ×2 IMPLANT
ELECT REM PT RETURN 9FT ADLT (ELECTROSURGICAL) ×2
ELECTRODE REM PT RTRN 9FT ADLT (ELECTROSURGICAL) ×1 IMPLANT
EXTRACTOR VACUUM M CUP 4 TUBE (SUCTIONS) IMPLANT
GLOVE BIO SURGEON STRL SZ8.5 (GLOVE) ×2 IMPLANT
GLOVE BIOGEL PI IND STRL 7.0 (GLOVE) ×1 IMPLANT
GLOVE BIOGEL PI INDICATOR 7.0 (GLOVE) ×3
GOWN STRL REUS W/TWL 2XL LVL3 (GOWN DISPOSABLE) ×2 IMPLANT
GOWN STRL REUS W/TWL LRG LVL3 (GOWN DISPOSABLE) ×4 IMPLANT
KIT ABG SYR 3ML LUER SLIP (SYRINGE) IMPLANT
NDL HYPO 25X5/8 SAFETYGLIDE (NEEDLE) IMPLANT
NEEDLE HYPO 25X5/8 SAFETYGLIDE (NEEDLE) IMPLANT
NS IRRIG 1000ML POUR BTL (IV SOLUTION) ×2 IMPLANT
PACK C SECTION WH (CUSTOM PROCEDURE TRAY) ×2 IMPLANT
PAD OB MATERNITY 4.3X12.25 (PERSONAL CARE ITEMS) ×2 IMPLANT
PENCIL SMOKE EVAC W/HOLSTER (ELECTROSURGICAL) ×2 IMPLANT
RTRCTR C-SECT PINK 25CM LRG (MISCELLANEOUS) ×1 IMPLANT
SPONGE LAP 18X18 X RAY DECT (DISPOSABLE) ×1 IMPLANT
SUT CHROMIC 0 CT 802H (SUTURE) ×2 IMPLANT
SUT CHROMIC 0 MO4 CR (SUTURE) IMPLANT
SUT CHROMIC 1 CTX 36 (SUTURE) ×4 IMPLANT
SUT CHROMIC 2 0 SH (SUTURE) ×2 IMPLANT
SUT GUT PLAIN 0 CT-3 TAN 27 (SUTURE) ×1 IMPLANT
SUT MON AB 4-0 PS1 27 (SUTURE) ×2 IMPLANT
SUT PDS AB 0 CTX 36 PDP370T (SUTURE) IMPLANT
SUT VIC AB 0 CT1 18XCR BRD8 (SUTURE) IMPLANT
SUT VIC AB 0 CT1 8-18 (SUTURE)
SUT VIC AB 0 CTX 36 (SUTURE) ×8
SUT VIC AB 0 CTX36XBRD ANBCTRL (SUTURE) ×2 IMPLANT
TOWEL OR 17X24 6PK STRL BLUE (TOWEL DISPOSABLE) ×2 IMPLANT
TRAY FOLEY CATH SILVER 14FR (SET/KITS/TRAYS/PACK) ×2 IMPLANT

## 2015-06-06 NOTE — Transfer of Care (Addendum)
Immediate Anesthesia Transfer of Care Note  Patient: [name]  Procedure(s) Performed: Procedure(s): CESAREAN SECTION (N/A)  Patient Location: PACU  Anesthesia Type:Spinal  Level of Consciousness: awake, alert , oriented and patient cooperative  Airway & Oxygen Therapy: Patient Spontanous Breathing  Post-op Assessment: Report given to RN and Post -op Vital signs reviewed and stable  Post vital signs: Reviewed and stable  Last Vitals:  TEMP 98.0  BP 114/70 HR 83 RR 18 POX 99  Complications: None

## 2015-06-06 NOTE — Op Note (Signed)
Previa preop diagnosis previous cesarean section at 38 weeks and 5 days chronic hypertension multiparity Postop diagnosis repeat low transverse cesarean section and tubal ligation Surgeon Dr. Francoise Ceo First assistant Dr. Coral Ceo Anesthesia spinal Procedure patient placed on the operating table in the supine position abdomen prepped and draped data entered with a Foley catheter a transverse suprapubic incision made fluid old scar carried down to the rectus fascia fascia cleaned and incised length of the incision recti muscles retracted laterally peritoneum incised longitudinally transverse incision made on the visceroperitoneum above the bladder bladder mobilized inferiorly transverse low uterine incision made patient delivered from the LOA position of a female Apgar 9 and 9 the placenta was plan posterior fundal removed manually and sent to pathology the uterine cavity clean with dry laps the uterine incision closed in one layer with continuous suture #1 chromic bladder flap reattached to a chromic right tube grasped in the midportion with a Babcock clamp 0 plain suture placed in the mesial salpinx below the portion of tube within the clamp this was tied at approximately 1 inch of tube transected procedure done in a similar fashion on the other side lap and sponge counts correct abdomen closed in layers peritoneum continuous with of 0 chromic fascia continuous with Demodex on skin closes subcuticular stitch of 4-0 Monocryl blood loss 700 cc

## 2015-06-06 NOTE — H&P (Signed)
This is Dr. Francoise Ceo dictating the history and physical on  [name]  she's a 35 year old gravida 4 para 1021 EDC 06/15/2015 followed by nonstress tests and ultrasound by MFM said that she had a negative NST today and should be brought in for induction patient elects to have a repeat C-section and tubal ligation her GBS is positive for which she got penicillin she is also chronic hypertensive but her blood pressures have been normal she is on labetalol 200 mg by mouth 4 times a day and her cervix is long and closed Past medical history history of chronic hypertension Past surgical history she had a C-section in the past Social history negative System review negative Physical exam well-developed female in no distress HEENT negative Lungs clear to P&A Heart regular rhythm no murmurs no gallops Breasts negative Abdomen term Pelvic cervix long closed vertex -3 Extremities negative

## 2015-06-06 NOTE — Anesthesia Preprocedure Evaluation (Signed)
Anesthesia Evaluation  Patient identified by MRN, date of birth, ID band Patient awake    Reviewed: Allergy & Precautions, H&P , Patient's Chart, lab work & pertinent test results  Airway Mallampati: II  TM Distance: >3 FB Neck ROM: full    Dental no notable dental hx.    Pulmonary asthma , Current Smoker,    Pulmonary exam normal breath sounds clear to auscultation       Cardiovascular Exercise Tolerance: Good hypertension, On Medications  Rhythm:regular Rate:Normal     Neuro/Psych    GI/Hepatic   Endo/Other    Renal/GU      Musculoskeletal   Abdominal   Peds  Hematology   Anesthesia Other Findings Chronic HTN on Meds Asthma; uses inhaler daily. Chest clear  Reproductive/Obstetrics                             Anesthesia Physical Anesthesia Plan  ASA: II  Anesthesia Plan: Spinal   Post-op Pain Management:    Induction:   Airway Management Planned:   Additional Equipment:   Intra-op Plan:   Post-operative Plan:   Informed Consent: I have reviewed the patients History and Physical, chart, labs and discussed the procedure including the risks, benefits and alternatives for the proposed anesthesia with the patient or authorized representative who has indicated his/her understanding and acceptance.   Dental Advisory Given  Plan Discussed with: CRNA  Anesthesia Plan Comments: (Lab work confirmed with CRNA in room. Platelets okay. Discussed spinal anesthetic, and patient consents to the procedure:  included risk of possible headache,backache, failed block, allergic reaction, and nerve injury. This patient was asked if she had any questions or concerns before the procedure started. )        Anesthesia Quick Evaluation

## 2015-06-06 NOTE — Anesthesia Postprocedure Evaluation (Signed)
Anesthesia Post Note  Patient: [name]  Procedure(s) Performed: Procedure(s) (LRB): CESAREAN SECTION (N/A)  Patient location during evaluation: PACU Anesthesia Type: Spinal Level of consciousness: awake Pain management: satisfactory to patient Vital Signs Assessment: post-procedure vital signs reviewed and stable Respiratory status: spontaneous breathing Cardiovascular status: blood pressure returned to baseline Postop Assessment: no headache and spinal receding Anesthetic complications: no    Last Vitals:  Filed Vitals:   06/06/15 2035 06/06/15 2036  BP:    Pulse: 70 68  Temp:    Resp: 15 19    Last Pain:  Filed Vitals:   06/06/15 2037  PainSc: 3                  Mayre Bury EDWARD

## 2015-06-07 ENCOUNTER — Encounter (HOSPITAL_COMMUNITY): Payer: Self-pay | Admitting: *Deleted

## 2015-06-07 LAB — CBC
HCT: 30 % — ABNORMAL LOW (ref 36.0–46.0)
Hemoglobin: 9.8 g/dL — ABNORMAL LOW (ref 12.0–15.0)
MCH: 29.9 pg (ref 26.0–34.0)
MCHC: 32.7 g/dL (ref 30.0–36.0)
MCV: 91.5 fL (ref 78.0–100.0)
PLATELETS: 178 10*3/uL (ref 150–400)
RBC: 3.28 MIL/uL — AB (ref 3.87–5.11)
RDW: 15 % (ref 11.5–15.5)
WBC: 10.4 10*3/uL (ref 4.0–10.5)

## 2015-06-07 LAB — RPR: RPR Ser Ql: NONREACTIVE

## 2015-06-07 MED ORDER — OXYCODONE-ACETAMINOPHEN 5-325 MG PO TABS
2.0000 | ORAL_TABLET | ORAL | Status: DC | PRN
  Administered 2015-06-07 – 2015-06-09 (×10): 2 via ORAL
  Filled 2015-06-07 (×10): qty 2

## 2015-06-07 NOTE — Plan of Care (Signed)
Problem: Urinary Elimination: Goal: Ability to reestablish a normal urinary elimination pattern will improve Outcome: Progressing Voided x 1

## 2015-06-07 NOTE — Anesthesia Postprocedure Evaluation (Signed)
Anesthesia Post Note  Patient: [name]  Procedure(s) Performed: Procedure(s) (LRB): CESAREAN SECTION (N/A)  Patient location during evaluation: Mother Baby Anesthesia Type: Spinal Level of consciousness: awake and alert Pain management: pain level controlled Vital Signs Assessment: post-procedure vital signs reviewed and stable Respiratory status: spontaneous breathing Cardiovascular status: stable Postop Assessment: no headache, no backache, no signs of nausea or vomiting and adequate PO intake Anesthetic complications: no    Last Vitals:  Filed Vitals:   06/07/15 0010 06/07/15 0350  BP: 116/63   Pulse: 72   Temp: 36.9 C 36.9 C  Resp: 18 18    Last Pain:  Filed Vitals:   06/07/15 0714  PainSc: 9                  Duston Smolenski Hristova

## 2015-06-07 NOTE — Addendum Note (Signed)
Addendum  created 06/07/15 0849 by Elgie Congo, CRNA   Modules edited: Clinical Notes   Clinical Notes:  File: [id number]

## 2015-06-07 NOTE — Lactation Note (Signed)
This note was copied from the chart of Melissa [name]. Lactation Consultation Note  Patient Name: [name] [id number] Date: 06/07/2015 Reason for consult: Follow-up assessment Baby at 22 hr of life and mom is worried about bf. She does not think the baby is latching well and is worried she does not have enough milk. Discussed baby behavior, pacifiers, smoking, caffeine, normal healthy diet, feeding frequency, baby belly size, voids, wt loss, breast changes, and nipple care. Demonstrated manual expression, colostrum noted bilaterally. Given lactation handouts. Aware of OP services and support group.     Maternal Data Has patient been taught Hand Expression?: Yes Does the patient have breastfeeding experience prior to this delivery?: No  Feeding Feeding Type: Breast Fed Length of feed: 10 min  LATCH Score/Interventions Latch: Repeated attempts needed to sustain latch, nipple held in mouth throughout feeding, stimulation needed to elicit sucking reflex. Intervention(s): Skin to skin;Teach feeding cues Intervention(s): Adjust position;Assist with latch;Breast compression  Audible Swallowing: Spontaneous and intermittent Intervention(s): Hand expression;Skin to skin Intervention(s): Alternate breast massage  Type of Nipple: Everted at rest and after stimulation  Comfort (Breast/Nipple): Soft / non-tender     Hold (Positioning): Assistance needed to correctly position infant at breast and maintain latch. Intervention(s): Position options;Support Pillows  LATCH Score: 8  Lactation Tools Discussed/Used WIC Program: Yes   Consult Status Consult Status: Follow-up Date: 06/08/15 Follow-up type: In-patient    [name] 06/07/2015, 5:30 PM

## 2015-06-07 NOTE — Progress Notes (Signed)
Patient ID: [name], female   DOB: [birth year], 35 y.o.   MRN: [id number] Postop #1 Blood pressure low 116/63 Abdomen soft dressing dry Legs negative doing well

## 2015-06-07 NOTE — Lactation Note (Signed)
This note was copied from the chart of [name]. Lactation Consultation Note  Patient Name: Melissa Jennice Renegar [id number] Date: 06/07/2015 Reason for consult: Initial assessment (mom is the bathroom , and per mom baby last fed at 1400 for 20 mins , enc to call for with feeding cues )  Baby is 20 hours old , and has been to the breast several times.    Maternal Data    Feeding Feeding Type: Breast Fed Length of feed: 20 min (per mom, )  LATCH Score/Interventions                      Lactation Tools Discussed/Used     Consult Status Consult Status: Follow-up Date: 06/07/15 Follow-up type: In-patient    Kathrin Greathouse 06/07/2015, 3:35 PM

## 2015-06-07 NOTE — Clinical Social Work Maternal (Signed)
CLINICAL SOCIAL WORK MATERNAL/CHILD NOTE  Patient Details  Name: [name] MRN: [id number] Date of Birth: 08/24/1980  Date:  06/07/2015  Clinical Social Worker Initiating Note:  Jazia Faraci MSW, LCSW Date/ Time Initiated:  06/07/15/1130     Child's Name:  Emma Jane   Legal Guardian:  Yuvia Lafata and Adam Saul  Need for Interpreter:  None   Date of Referral:  06/06/15     Reason for Referral:  History of depression  Referral Source:  [facility]   Address:  [address] [place], [place] [address]  Phone number:  [phone]   Household Members:  Significant Other, Minor Children   Natural Supports (not living in the home):  Immediate Family   Professional Supports: None   Employment: Homemaker   Type of Work:     Education:      Financial Resources:  Medicaid   Other Resources:  Food Stamps , WIC   Cultural/Religious Considerations Which May Impact Care:  None reported  Strengths:  Ability to meet basic needs , Home prepared for child , Pediatrician chosen    Risk Factors/Current Problems:  Mental Health Concerns    Cognitive State:  Able to Concentrate , Alert , Linear Thinking , Goal Oriented    Mood/Affect:  Flat , Calm    CSW Assessment:  CSW received request for consult due to MOB presenting with a history of depression.  MOB provided consent for the FOB to remain in the room during the assessment.   MOB presented with a flattened affect, limited range in affect noted.  MOB's self-report of mood inconsistent with presentation, as she stated that she was "happy" and "excited".  The FOB was attempting to care for the infant during the assessment. CSW noted tension between MOB and FOB as he attempted to care for the infant. MOB confirmed that she is feeling frustrated at times with the FOB since he is a first time father, he is attempting to learn, and she already knows how to care for an infant since she has a 7 year old son.    The FOB attempted to calm the infant, but she continued to cry during the assessment. Due to tension and crying of the infant, there were barriers to engagement.    MOB reported history of depression since 35 years old. She did not discuss prior history in detail, but shared that she last felt that depression had an unwanted effect on her life prior to conception. She stated that she had experienced a miscarriage, and noted that the miscarried re-triggered depression. MOB reported that she had been participating in therapy and medication management at [facility] pre-pregnancy. She stated that she was prescribed Latuda and Prozac, but all medications were discontinued with the +UPT and received no further mental health care.  MOB discussed symptoms of depression and anxiety during the pregnancy.  She stated that she noted increase in crying, feelings of sadness, and increased sense of irritability. MOB reported that she was easily angered, constantly worried, and felt "anxious" and restless.  MOB denied any feelings of being hopeless and helpless, and denied ever dreading the future.  MOB presented with underdeveloped emotional regulation skills as she struggled to identify emotional regulation skills that would assist her to cope with stressors. She stated that she likes to "sleep" and finds happiness in her role as a mother.  MOB reported belief that there were extra familial stressors that impacted her symptoms, but she did not clarify   the exact stressors.  Per MOB, symptoms were consistent throughout the pregnancy.   MOB reported that she has a history of postpartum depression. She stated that she experienced symptoms for approximately 3-4 months.  MOB did not further clarify symptoms.  CSW reviewed MOB's current risk factors for postpartum depression (history of depression, history of postpartum depression, and depression during the pregnancy).  MOB shared that she intends to follow up at [facility] to re-start  medications, and declined offer to talk with her OBGYN regarding medications prior to discharge.  MOB denied barriers to accessing care, and stated that she is familiar with how to access services. MOB's comments highlighted motivation and intention to seek out mental health treatment postpartum due to the perceived benefits of receiving care.    MOB identified her sister and her mother as her primary sources of support. She stated that the home is prepared for the infant, and is looking forward to transitioning to caring for two children. MOB reported that she is looking forward to raising her daughter, and learning about the differences between caring for a son and a daughter.   CSW Plan/Description:   1. Patient/Family Education-- Perinatal mood and anxiety disorders 2. CSW consulted with RN and provided update on assessment. CSW shared goal of attempting to follow up with MOB on 1/19 due limitations in engagement.  2. No Further Intervention Required/No Barriers to Discharge   Kindal Ponti N, LCSW 06/07/2015, 1:48 PM  

## 2015-06-08 ENCOUNTER — Inpatient Hospital Stay (HOSPITAL_COMMUNITY): Admission: RE | Admit: 2015-06-08 | Payer: No Typology Code available for payment source | Source: Ambulatory Visit

## 2015-06-08 NOTE — Progress Notes (Signed)
Patient ID: [name], female   DOB: [birth year], 35 y.o.   MRN: [id number] Postop day 2 Blood pressure 129/79 afebrile pulse 96 respiration 18 Abdomen soft dressing dry legs negative doing well

## 2015-06-08 NOTE — Lactation Note (Signed)
This note was copied from the chart of [name]. Lactation Consultation Note  Patient Name: [name] [id number] Date: 06/08/2015 Reason for consult: Follow-up assessment (since yesterday all Bottle feedings/ per mom plans to only bottle feed due to being less stressful and she seems more satisfied. )  LC recommended to mom if she changed her mind and decided she wanted to breast feed to call for assistance.  Presently baby sound asleep on moms chest and mom resting in bed .     Maternal Data    Feeding Feeding Type: Formula Nipple Type: Slow - flow  LATCH Score/Interventions                      Lactation Tools Discussed/Used     Consult Status Consult Status: Complete Date: 06/08/15 Follow-up type: In-patient    Kathrin Greathouse 06/08/2015, 3:03 PM

## 2015-06-09 ENCOUNTER — Other Ambulatory Visit (HOSPITAL_COMMUNITY): Payer: Medicaid Other

## 2015-06-09 NOTE — Discharge Instructions (Signed)
Discharge instructions ° °· You can wash your hair °· Shower °· Eat what you want °· Drink what you want °· See me in 6 weeks °· Your ankles are going to swell more in the next 2 weeks than when pregnant °· No sex for 6 weeks ° ° °[name], MD 06/09/2015 ° ° °

## 2015-06-09 NOTE — Progress Notes (Signed)
Patient ID: [name], female   DOB: 1981/03/28, 35 y.o.   MRN: [id number] Postop day 3 Blood pressure 145 was 65 pulse 84 respiration 18 Fundus firm Lochia moderate Legs negative Incision clean and dry home today

## 2015-06-09 NOTE — Progress Notes (Signed)
Pt discharge education completed and discharge paperwork discussed.  Pt given pre-eclampsia handout and expressed understanding of symptoms of elevated blood pressure and when to call MD.  Pt reports she will continue taking her blood pressure medication that she was taking prior to admission.  Pt verbalizes understanding that blood pressure is increased when she is under stress and she verbalized understanding of ways to decrease stress when home.  Pt BP normalized prior to discharge.

## 2015-06-09 NOTE — Discharge Summary (Signed)
Obstetric Discharge Summary Reason for Admission: cesarean section Prenatal Procedures: none Intrapartum Procedures: cesarean: low cervical, transverse Postpartum Procedures: none Complications-Operative and Postpartum: none HEMOGLOBIN  Date Value Ref Range Status  06/07/2015 9.8* 12.0 - 15.0 g/dL Final   HCT  Date Value Ref Range Status  06/07/2015 30.0* 36.0 - 46.0 % Final    Physical Exam:  General: alert Lochia: appropriate Uterine Fundus: firm Incision: healing well DVT Evaluation: No evidence of DVT seen on physical exam.  Discharge Diagnoses: Term Pregnancy-delivered  Discharge Information: Date: 06/09/2015 Activity: pelvic rest Diet: routine Medications: Percocet Condition: improved Instructions: refer to practice specific booklet Discharge to: home Follow-up Information    Follow up with Kathreen Cosier, MD.   Specialty:  Obstetrics and Gynecology   Contact information:   [address] [place] [address] [phone]       Newborn Data: Live born female  Birth Weight: 7 lb 4.9 oz (3315 g) APGAR: 9, 9  Home with mother.  MARSHALL,BERNARD A 06/09/2015, 6:04 AM

## 2015-06-13 ENCOUNTER — Encounter (HOSPITAL_COMMUNITY): Payer: Self-pay | Admitting: *Deleted

## 2015-06-13 ENCOUNTER — Ambulatory Visit (HOSPITAL_COMMUNITY): Payer: Medicaid Other

## 2015-06-13 ENCOUNTER — Other Ambulatory Visit (HOSPITAL_COMMUNITY): Payer: Medicaid Other

## 2015-06-13 ENCOUNTER — Observation Stay (HOSPITAL_COMMUNITY)
Admission: AD | Admit: 2015-06-13 | Discharge: 2015-06-15 | Disposition: A | Discharge status: Home / Self Care | Payer: Medicaid Other | Source: Ambulatory Visit | Attending: Obstetrics | Admitting: Obstetrics

## 2015-06-13 DIAGNOSIS — O99335 Smoking (tobacco) complicating the puerperium: Secondary | ICD-10-CM | POA: Insufficient documentation

## 2015-06-13 DIAGNOSIS — F17210 Nicotine dependence, cigarettes, uncomplicated: Secondary | ICD-10-CM | POA: Insufficient documentation

## 2015-06-13 DIAGNOSIS — O1003 Pre-existing essential hypertension complicating the puerperium: Secondary | ICD-10-CM | POA: Diagnosis not present

## 2015-06-13 DIAGNOSIS — O9089 Other complications of the puerperium, not elsewhere classified: Secondary | ICD-10-CM | POA: Diagnosis present

## 2015-06-13 LAB — COMPREHENSIVE METABOLIC PANEL
ALBUMIN: 3.2 g/dL — AB (ref 3.5–5.0)
ALT: 30 U/L (ref 14–54)
AST: 24 U/L (ref 15–41)
Alkaline Phosphatase: 100 U/L (ref 38–126)
Anion gap: 11 (ref 5–15)
BILIRUBIN TOTAL: 0.5 mg/dL (ref 0.3–1.2)
BUN: 16 mg/dL (ref 6–20)
CHLORIDE: 99 mmol/L — AB (ref 101–111)
CO2: 25 mmol/L (ref 22–32)
Calcium: 9.5 mg/dL (ref 8.9–10.3)
Creatinine, Ser: 1.09 mg/dL — ABNORMAL HIGH (ref 0.44–1.00)
GFR calc Af Amer: >60 mL/min (ref >60)
GFR calc non Af Amer: >60 mL/min (ref >60)
GLUCOSE: 78 mg/dL (ref 65–99)
POTASSIUM: 3.9 mmol/L (ref 3.5–5.1)
Sodium: 135 mmol/L (ref 135–145)
Total Protein: 7.7 g/dL (ref 6.5–8.1)

## 2015-06-13 LAB — CBC
HEMATOCRIT: 36.9 % (ref 36.0–46.0)
Hemoglobin: 12.2 g/dL (ref 12.0–15.0)
MCH: 30 pg (ref 26.0–34.0)
MCHC: 33.1 g/dL (ref 30.0–36.0)
MCV: 90.7 fL (ref 78.0–100.0)
Platelets: 390 10*3/uL (ref 150–400)
RBC: 4.07 MIL/uL (ref 3.87–5.11)
RDW: 14.4 % (ref 11.5–15.5)
WBC: 12.4 10*3/uL — ABNORMAL HIGH (ref 4.0–10.5)

## 2015-06-13 LAB — TYPE AND SCREEN
ABO/RH(D): O NEG
ANTIBODY SCREEN: NEGATIVE

## 2015-06-13 MED ORDER — PRENATAL MULTIVITAMIN CH
1.0000 | ORAL_TABLET | Freq: Every day | ORAL | Status: DC
  Administered 2015-06-14: 1 via ORAL
  Filled 2015-06-13: qty 1

## 2015-06-13 MED ORDER — MAGNESIUM SULFATE BOLUS VIA INFUSION
4.0000 g | Freq: Once | INTRAVENOUS | Status: AC
  Administered 2015-06-13: 4 g via INTRAVENOUS
  Filled 2015-06-13: qty 500

## 2015-06-13 MED ORDER — DOCUSATE SODIUM 100 MG PO CAPS
100.0000 mg | ORAL_CAPSULE | Freq: Every day | ORAL | Status: DC
  Administered 2015-06-13 – 2015-06-14 (×2): 100 mg via ORAL
  Filled 2015-06-13 (×2): qty 1

## 2015-06-13 MED ORDER — CALCIUM CARBONATE ANTACID 500 MG PO CHEW: 2.0000 | CHEWABLE_TABLET | ORAL | Status: DC | PRN

## 2015-06-13 MED ORDER — ZOLPIDEM TARTRATE 5 MG PO TABS
5.0000 mg | ORAL_TABLET | Freq: Every evening | ORAL | Status: DC | PRN
  Administered 2015-06-14 (×2): 5 mg via ORAL
  Filled 2015-06-13 (×2): qty 1

## 2015-06-13 MED ORDER — ACETAMINOPHEN 325 MG PO TABS: 650.0000 mg | ORAL_TABLET | ORAL | Status: DC | PRN

## 2015-06-13 MED ORDER — OXYCODONE-ACETAMINOPHEN 5-325 MG PO TABS
1.0000 | ORAL_TABLET | ORAL | Status: DC | PRN
  Administered 2015-06-13 – 2015-06-14 (×4): 2 via ORAL
  Administered 2015-06-15: 1 via ORAL
  Filled 2015-06-13: qty 2
  Filled 2015-06-13: qty 1
  Filled 2015-06-13 (×3): qty 2

## 2015-06-13 MED ORDER — MAGNESIUM SULFATE 50 % IJ SOLN
2.0000 g/h | INTRAVENOUS | Status: AC
  Administered 2015-06-13: 2 g/h via INTRAVENOUS
  Filled 2015-06-13: qty 80

## 2015-06-13 MED ORDER — LACTATED RINGERS IV SOLN
INTRAVENOUS | Status: DC
  Administered 2015-06-13: 18:00:00 via INTRAVENOUS

## 2015-06-13 NOTE — MAU Note (Signed)
Urine in lab 

## 2015-06-14 DIAGNOSIS — O1003 Pre-existing essential hypertension complicating the puerperium: Secondary | ICD-10-CM | POA: Diagnosis not present

## 2015-06-14 MED ORDER — LABETALOL HCL 200 MG PO TABS
200.0000 mg | ORAL_TABLET | Freq: Four times a day (QID) | ORAL | Status: DC
  Administered 2015-06-14 (×3): 200 mg via ORAL
  Filled 2015-06-14 (×2): qty 2
  Filled 2015-06-14: qty 1

## 2015-06-14 MED ORDER — AMLODIPINE BESYLATE 5 MG PO TABS
5.0000 mg | ORAL_TABLET | Freq: Every day | ORAL | Status: DC
  Administered 2015-06-14: 5 mg via ORAL
  Filled 2015-06-14 (×2): qty 1

## 2015-06-14 NOTE — Progress Notes (Signed)
Patient transferred to [facility] Unit rm 305 report called to Jerrell Mylar, RN

## 2015-06-14 NOTE — H&P (Signed)
This is Dr. Francoise Ceo dictating the history and physical on  [name] she's a 35 year old gravida 4 para 2022 who had a repeat C-section and tubal ligation in 06/06/2015 she is a known chronic hypertensive and did well postop she had been on labetalol 200 by mouth 4 times a day prior to delivery and after delivery she was seen in the office  today for blood pressure check and her blood pressures 160 of 110 and she had 4+ proteinuria she had been taking her labetalol so she was admitted for magnesium sulfate and control of her blood pressure since admission her latest blood pressures 111/68 she is on magnesium 4 g loading 2 g an hour she is also on Norvasc 10 mg by mouth daily Past surgical history C-section 2 and tubal ligation Past medical history history of chronic hypertension Social history noncontributory family history negative System review noncontributory Physical well-developed female in no distress HEENT negative Lungs clear to P&A Heart regular rhythm no murmurs no gallops Breasts negative Abdomen uterus 20 week size postpartum incision clean Pelvic deferred Extremities negative

## 2015-06-15 DIAGNOSIS — O1003 Pre-existing essential hypertension complicating the puerperium: Secondary | ICD-10-CM | POA: Diagnosis not present

## 2015-06-15 NOTE — Discharge Summary (Signed)
Patient is a chronic hypertensive who had repeat C-section on 06/06/2015 and she was seen in the office 2 days ago with blood pressure 160 of 110 and was admitted for magnesium sulfate and blood pressure control she is on Norvasc 5 mg by mouth daily and labetalol 203 times a day her blood pressures have been normal she been discharged today to see me in one week

## 2015-06-15 NOTE — Progress Notes (Signed)
Patient ID: [name], female   DOB: [birth year], 35 y.o.   MRN: [id number] Blood pressure 117/58 Incision clean and dry Lochia moderate Legs negative home today

## 2015-06-15 NOTE — Progress Notes (Signed)
Patient discharged home with significant other... Discharge instructions reviewed with patient and she verbalized understanding... Condition stable... No equipment... Ambulated to car with C. Riley, NT.  

## 2015-06-15 NOTE — Discharge Instructions (Signed)
Discharge instructions   You can wash your hair  Shower  Eat what you want  Drink what you want  See me in 6 weeks  Your ankles are going to swell more in the next 2 weeks than when pregnant  No sex for 6 weeks   Larence Thone A, MD 06/15/2015

## 2015-06-16 ENCOUNTER — Other Ambulatory Visit (HOSPITAL_COMMUNITY): Payer: Medicaid Other

## 2015-07-31 ENCOUNTER — Ambulatory Visit: Payer: Medicaid Other | Attending: Internal Medicine | Admitting: Internal Medicine

## 2015-07-31 ENCOUNTER — Other Ambulatory Visit: Payer: Self-pay

## 2015-07-31 ENCOUNTER — Ambulatory Visit (HOSPITAL_BASED_OUTPATIENT_CLINIC_OR_DEPARTMENT_OTHER): Payer: Medicaid Other | Admitting: Clinical

## 2015-07-31 ENCOUNTER — Encounter: Payer: Self-pay | Admitting: Internal Medicine

## 2015-07-31 VITALS — BP 150/109 | HR 70 | Temp 98.0°F | Resp 16 | Ht 64.0 in | Wt 185.4 lb

## 2015-07-31 DIAGNOSIS — Z87442 Personal history of urinary calculi: Secondary | ICD-10-CM | POA: Insufficient documentation

## 2015-07-31 DIAGNOSIS — I10 Essential (primary) hypertension: Secondary | ICD-10-CM | POA: Diagnosis not present

## 2015-07-31 DIAGNOSIS — Z720 Tobacco use: Secondary | ICD-10-CM | POA: Diagnosis not present

## 2015-07-31 DIAGNOSIS — J4530 Mild persistent asthma, uncomplicated: Secondary | ICD-10-CM | POA: Diagnosis not present

## 2015-07-31 DIAGNOSIS — F53 Postpartum depression: Secondary | ICD-10-CM

## 2015-07-31 DIAGNOSIS — F17200 Nicotine dependence, unspecified, uncomplicated: Secondary | ICD-10-CM

## 2015-07-31 DIAGNOSIS — O99345 Other mental disorders complicating the puerperium: Secondary | ICD-10-CM

## 2015-07-31 DIAGNOSIS — F411 Generalized anxiety disorder: Secondary | ICD-10-CM

## 2015-07-31 DIAGNOSIS — N83209 Unspecified ovarian cyst, unspecified side: Secondary | ICD-10-CM | POA: Insufficient documentation

## 2015-07-31 DIAGNOSIS — J45909 Unspecified asthma, uncomplicated: Secondary | ICD-10-CM | POA: Insufficient documentation

## 2015-07-31 DIAGNOSIS — F32A Depression, unspecified: Secondary | ICD-10-CM

## 2015-07-31 DIAGNOSIS — F419 Anxiety disorder, unspecified: Secondary | ICD-10-CM | POA: Diagnosis not present

## 2015-07-31 DIAGNOSIS — F17210 Nicotine dependence, cigarettes, uncomplicated: Secondary | ICD-10-CM | POA: Insufficient documentation

## 2015-07-31 DIAGNOSIS — IMO0001 Reserved for inherently not codable concepts without codable children: Secondary | ICD-10-CM

## 2015-07-31 DIAGNOSIS — J069 Acute upper respiratory infection, unspecified: Secondary | ICD-10-CM | POA: Diagnosis not present

## 2015-07-31 DIAGNOSIS — Z79899 Other long term (current) drug therapy: Secondary | ICD-10-CM | POA: Insufficient documentation

## 2015-07-31 DIAGNOSIS — F329 Major depressive disorder, single episode, unspecified: Secondary | ICD-10-CM | POA: Diagnosis not present

## 2015-07-31 DIAGNOSIS — O021 Missed abortion: Secondary | ICD-10-CM | POA: Insufficient documentation

## 2015-07-31 MED ORDER — AMLODIPINE BESYLATE 5 MG PO TABS: 5.0000 mg | ORAL_TABLET | Freq: Every day | ORAL | Status: DC

## 2015-07-31 MED ORDER — MONTELUKAST SODIUM 10 MG PO TABS
10.0000 mg | ORAL_TABLET | Freq: Every day | ORAL | Status: DC
Start: 2015-07-31 — End: 2015-07-31

## 2015-07-31 MED ORDER — MONTELUKAST SODIUM 10 MG PO TABS: 10.0000 mg | ORAL_TABLET | Freq: Every day | ORAL | Status: DC

## 2015-07-31 MED ORDER — METOPROLOL TARTRATE 25 MG PO TABS: 25.0000 mg | ORAL_TABLET | Freq: Two times a day (BID) | ORAL | Status: DC

## 2015-07-31 MED ORDER — FLUOXETINE HCL 40 MG PO CAPS: 40.0000 mg | ORAL_CAPSULE | Freq: Every day | ORAL | Status: DC

## 2015-07-31 MED ORDER — FLUOXETINE HCL 40 MG PO CAPS
40.0000 mg | ORAL_CAPSULE | Freq: Every day | ORAL | Status: DC
Start: 2015-07-31 — End: 2015-07-31

## 2015-07-31 MED FILL — FLUoxetine HCL 40 MG CAPS: 40 | 30 days supply | Qty: 30 | Fill #0

## 2015-07-31 MED FILL — AMLODIPINE BESYLATE 5 MG TA: 5 | 30 days supply | Qty: 30 | Fill #0

## 2015-07-31 MED FILL — ?MONTELUKAST SOD 10 MG TAB: 10 | 30 days supply | Qty: 30 | Fill #0

## 2015-07-31 MED FILL — ?METOPROLOL 25 MG TABLET: 25 | 30 days supply | Qty: 60 | Fill #0

## 2015-07-31 NOTE — Patient Instructions (Addendum)
- financial services - Jamie/sw - fu w/ [facility] pharm 2 wks for bp check  Fu w/ me in 3months  It was a pleasure meeting you.  Postpartum Depression and Baby Blues The postpartum period begins right after the birth of a baby. During this time, there is often a great amount of joy and excitement. It is also a time of many changes in the life of the parents. Regardless of how many times a mother gives birth, each child brings new challenges and dynamics to the family. It is not unusual to have feelings of excitement along with confusing shifts in moods, emotions, and thoughts. All mothers are at risk of developing postpartum depression or the "baby blues." These mood changes can occur right after giving birth, or they may occur many months after giving birth. The baby blues or postpartum depression can be mild or severe. Additionally, postpartum depression can go away rather quickly, or it can be a long-term condition.  CAUSES Raised hormone levels and the rapid drop in those levels are thought to be a main cause of postpartum depression and the baby blues. A number of hormones change during and after pregnancy. Estrogen and progesterone usually decrease right after the delivery of your baby. The levels of thyroid hormone and various cortisol steroids also rapidly drop. Other factors that play a role in these mood changes include major life events and genetics.  RISK FACTORS If you have any of the following risks for the baby blues or postpartum depression, know what symptoms to watch out for during the postpartum period. Risk factors that may increase the likelihood of getting the baby blues or postpartum depression include:  Having a personal or family history of depression.   Having depression while being pregnant.   Having premenstrual mood issues or mood issues related to oral contraceptives.  Having a lot of life stress.   Having marital conflict.   Lacking a social support network.    Having a baby with special needs.   Having health problems, such as diabetes.  SIGNS AND SYMPTOMS Symptoms of baby blues include:  Brief changes in mood, such as going from extreme happiness to sadness.  Decreased concentration.   Difficulty sleeping.   Crying spells, tearfulness.   Irritability.   Anxiety.  Symptoms of postpartum depression typically begin within the first month after giving birth. These symptoms include:  Difficulty sleeping or excessive sleepiness.   Marked weight loss.   Agitation.   Feelings of worthlessness.   Lack of interest in activity or food.  Postpartum psychosis is a very serious condition and can be dangerous. Fortunately, it is rare. Displaying any of the following symptoms is cause for immediate medical attention. Symptoms of postpartum psychosis include:   Hallucinations and delusions.   Bizarre or disorganized behavior.   Confusion or disorientation.  DIAGNOSIS  A diagnosis is made by an evaluation of your symptoms. There are no medical or lab tests that lead to a diagnosis, but there are various questionnaires that a health care provider may use to identify those with the baby blues, postpartum depression, or psychosis. Often, a screening tool called the [place] Postnatal Depression Scale is used to diagnose depression in the postpartum period.  TREATMENT The baby blues usually goes away on its own in 1-2 weeks. Social support is often all that is needed. You will be encouraged to get adequate sleep and rest. Occasionally, you may be given medicines to help you sleep.  Postpartum depression requires treatment because  it can last several months or longer if it is not treated. Treatment may include individual or group therapy, medicine, or both to address any social, physiological, and psychological factors that may play a role in the depression. Regular exercise, a healthy diet, rest, and social support may also be  strongly recommended.  Postpartum psychosis is more serious and needs treatment right away. Hospitalization is often needed. HOME CARE INSTRUCTIONS  Get as much rest as you can. Nap when the baby sleeps.   Exercise regularly. Some women find yoga and walking to be beneficial.   Eat a balanced and nourishing diet.   Do little things that you enjoy. Have a cup of tea, take a bubble bath, read your favorite magazine, or listen to your favorite music.  Avoid alcohol.   Ask for help with household chores, cooking, grocery shopping, or running errands as needed. Do not try to do everything.   Talk to people close to you about how you are feeling. Get support from your partner, family members, friends, or other new moms.  Try to stay positive in how you think. Think about the things you are grateful for.   Do not spend a lot of time alone.   Only take over-the-counter or prescription medicine as directed by your health care provider.  Keep all your postpartum appointments.   Let your health care provider know if you have any concerns.  SEEK MEDICAL CARE IF: You are having a reaction to or problems with your medicine. SEEK IMMEDIATE MEDICAL CARE IF:  You have suicidal feelings.   You think you may harm the baby or someone else. MAKE SURE YOU:  Understand these instructions.  Will watch your condition.  Will get help right away if you are not doing well or get worse.   This information is not intended to replace advice given to you by your health care provider. Make sure you discuss any questions you have with your health care provider.   Document Released: 02/08/2004 Document Revised: 05/11/2013 Document Reviewed: 02/15/2013 Elsevier Interactive Patient Education [employer]

## 2015-07-31 NOTE — Addendum Note (Signed)
Addended byDierdre Searles: Catheryn Slifer T on: 07/31/2015 06:01 PM   Modules accepted: Orders, SmartSet

## 2015-07-31 NOTE — Progress Notes (Signed)
[name], is a 35 y.o. female  FAO:[id number]:[id number]  ONG:[id number]  DOB - [birth year]  CC:  Chief Complaint  Patient presents with  . Establish Care  . Hypertension       HPI: [name] is a 35 y.o. female G2 P2022, here today to establish medical care.  Sp C-section/tubal ligation 06/06/15, had to be addmitted 1/24-1/26 for preeclampsia requiring mag iv.  Patient has hx of chronic htn as well, for which bp rx was adjusted during pregnancy.  Since OBgyn fu last week per pt (6wk fu), she was told she was doing well.   She is still having vaginal bleeding, which she was told was normal.  Also co of uri symptoms x 3 days, w/ nasal congestion, subjective f/c, and productive cough.  Has asthma, uses both qvar and pulmicort mdi, but also using her proair 3-4 times /day.  She states she is still smoking, down to 6 cigs/day.  Knows she should stop, but not quiet ready mentally.  Attest to crying at times, emotionally stressed.  No si/hi/ah/vh.  She use to be on prozac 40 but has since run out, now only on  zoloft 50 daily.  Seen in room w/ her 2nd grade son and new 6wk baby girl.  Patient has No headache, No chest pain, No abdominal pain - No Nausea, No new weakness tingling or numbness, No Cough - SOB.  No Known Allergies Past Medical History  Diagnosis Date  . Asthma   . Hypertension   . Anxiety   . Pregnancy induced hypertension   . Kidney stones   . Ovarian cyst   . Vaginal Pap smear, abnormal     bx, f/u ok  . Depression     doing good, not on meds now  . Missed abortion 10/02/2013  . Headache     migraines   No current outpatient prescriptions on file prior to visit.   No current facility-administered medications on file prior to visit.   Family History  Problem Relation Age of Onset  . Hearing loss Mother   . Asthma Mother   . Diabetes Son   . Cancer Maternal Grandfather     bone  . Liver disease Maternal Grandfather   . Asthma Father   . Asthma Sister   .  Lupus Sister    Social History   Social History  . Marital Status: Single    Spouse Name: N/A  . Number of Children: N/A  . Years of Education: N/A   Occupational History  . Not on file.   Social History Main Topics  . Smoking status: Current Some Day Smoker -- 0.50 packs/day for 15 years    Types: Cigarettes  . Smokeless tobacco: Never Used     Comment: Smoking .5 ppd  . Alcohol Use: No     Comment: very occassionally  . Drug Use: No  . Sexual Activity:    Partners: Male   Other Topics Concern  . Not on file   Social History Narrative    Review of Systems: Constitutional: Negative for fever, chills, diaphoresis, activity change, appetite change and fatigue. HENT: Negative for ear pain, nosebleeds, facial swelling, rhinorrhea, neck pain, neck stiffness and ear discharge.   +  Congestion/sore throat x 3 days Eyes: Negative for pain, discharge, redness, itching and visual disturbance. Respiratory: Negative for cough, choking, chest tightness, shortness of breath, wheezing and stridor.  Cardiovascular: Negative for chest pain, palpitations and leg swelling. Gastrointestinal: Negative for abdominal distention.  Genitourinary: Negative for dysuria, urgency, frequency, hematuria, flank pain, decreased urine volume, difficulty urinating and dyspareunia.   Still has vaginal bleeding. Musculoskeletal: Negative for back pain, joint swelling, arthralgia and gait problem. Neurological: Negative for dizziness, tremors, seizures, syncope, facial asymmetry, speech difficulty, weakness, light-headedness, numbness and headaches.  Hematological: Negative for adenopathy. Does not bruise/bleed easily. Psychiatric/Behavioral: Negative for hallucinations, behavioral problems, confusion, + depressed, decreased concentration. No agitation.    Objective:   Filed Vitals:   07/31/15 1646  BP: 150/109  Pulse: 70  Temp: 98 F (36.7 C)  Resp: 16    Physical Exam: Constitutional: Patient  appears well-developed and well-nourished. No distress.  aaox3 HENT: Normocephalic, atraumatic, External right and left ear normal. Bilateral tm clear/good light reflex. Oropharynx with postnasal drip/erythema/raw appearing. Eyes: Conjunctivae and EOM are normal. PERRL, no scleral icterus. Neck: Normal ROM. Neck supple. No JVD. No tracheal deviation. No thyromegaly. CVS: RRR, S1/S2 +, no murmurs, no gallops, no carotid bruit.  Pulmonary: Effort and breath sounds normal, no stridor, rhonchi, wheezes, rales.  Abdominal: Soft. BS +, no distension, tenderness, rebound or guarding.  Musculoskeletal: Normal range of motion. No edema and no tenderness.  Lymphadenopathy: No lymphadenopathy noted, cervical, inguinal or axillary Neuro: Alert. Normal reflexes, muscle tone coordination. No cranial nerve deficit. Skin: Skin is warm and dry. No rash noted. Not diaphoretic. No erythema. No pallor. Psychiatric: behavior, judgment, thought content normal. + tearful at times.  Lab Results  Component Value Date   WBC 12.4* 06/13/2015   HGB 12.2 06/13/2015   HCT 36.9 06/13/2015   MCV 90.7 06/13/2015   PLT 390 06/13/2015   Lab Results  Component Value Date   CREATININE 1.09* 06/13/2015   BUN 16 06/13/2015   NA 135 06/13/2015   K 3.9 06/13/2015   CL 99* 06/13/2015   CO2 25 06/13/2015    Lab Results  Component Value Date   HGBA1C 5.2 07/29/2014   Lipid Panel  No results found for: CHOL, TRIG, HDL, CHOLHDL, VLDL, LDLCALC     Assessment and plan:   1. Depression/postpartum depression - continue zoloft, renewed prozac 40qday. - patient denies si/hi/ah/vh - [name] /sw cs.  2. Essential hypertension, benign - per pt, currently off all meds until she saw [place]. - will start the following: - metoprolol tartrate (LOPRESSOR) 25 MG tablet; Take 1 tablet (25 mg total) by mouth 2 (two) times daily.  Dispense: 180 tablet; Refill: 3 - amLODipine (NORVASC) 5 MG tablet; Take 1 tablet (5 mg total) by mouth  daily.  Dispense: 90 tablet; Refill: 3 - bp chk w/ [name] pharm clinitian in 2 wks.  3. Asthma, chronic, mild persistent, uncomplicated - requiring rescue 3-4 times a day persistently since pregnancy.  Pt not breastfeeding currently. - trial montelukast (SINGULAIR) 10 MG tablet; Take 1 tablet (10 mg total) by mouth at bedtime.  Dispense: 30 tablet; Refill: 3  4. Smoking +6cigs/day since 35yo, has some nicoderm patches at home, but under a lot of stress, not ready to quit, but may be affecting her asthma as well. - tobacco cessation counseling given.  5 Viral URI - encourage supportive care, fluids, stop smoking. Fu w/ [place] if doesn't improve in few weeks.   Return in about 3 months (around 10/31/2015).  The patient was given clear instructions to go to ER or return to medical center if symptoms don't improve, worsen or new problems develop. The patient verbalized understanding. The patient was told to call to get lab results if they haven't heard  anything in the next week.     Pete Glatter, MD, MBA/MHA [facility] [place], [place] [phone]   07/31/2015, 5:11 PM

## 2015-07-31 NOTE — Progress Notes (Signed)
ASSESSMENT: Pt currently experiencing symptoms of Postpartum depression and Postpartum anxiety. [facility], as well as establish BH med management with psychiatry. Pt would benefit from brief therapeutic interventions, as well as increased social support.  Stage of Change: contemplative  PLAN: 1. F/U with behavioral health consultant in two weeks, or earlier, as needed 2. Psychiatric Medications: Prozac, Zoloft 3. Behavioral recommendation(s):   -Call mother daily -Nap when baby naps, daily -Consider Feelings After Birth support group -Call [facility] for new patient visit -Consider reading educational material regarding coping with Postpartum depression SUBJECTIVE: Pt. referred by Dr Illene LabradorLangland for postnatal depression and anxiety:  Pt. reports the following symptoms/concerns: Pt states she went through similar postpartum depression and anxiety with first child (7yo), gave birth 8 weeks prior, worries about 7yo diabetic son; pt does not regularly sleep throughout night. Pt feels better when she talks to her mother via telephone and takes nap during babys morning nap.  Duration of problem: At least one month Severity: severe  OBJECTIVE: Orientation & Cognition: Oriented x3. Thought processes normal and appropriate to situation. Mood: appropriate. Affect: appropriate Appearance: appropriate Risk of harm to self or others: no known risk of harm to self or others Substance use: tobacco Assessments administered: PHQ9: 21/ GAD7: 20  Diagnosis: Postpartum depression/ Postpartum anxiety CPT Code: F53/ O99.345 -------------------------------------------- Other(s) present in the room: 867yo son, 868-week-old daughter  Time spent with patient in exam room: 20 minutes, 5-5:20pm   Depression screen [facility] 2/9 07/31/2015 06/06/2015 06/02/2015 05/30/2015 05/26/2015  Decreased Interest 3 0 0 0 0  Down, Depressed, Hopeless 3 0 0 0 0  PHQ - 2 Score 6 0 0 0 0   Altered sleeping 3 - - - -  Tired, decreased energy 3 - - - -  Change in appetite 2 - - - -  Feeling bad or failure about yourself  2 - - - -  Trouble concentrating 3 - - - -  Moving slowly or fidgety/restless 2 - - - -  Suicidal thoughts 0 - - - -  PHQ-9 Score 21 - - - -    GAD7: 20 (3,3,3,3,3,3,2)

## 2015-07-31 NOTE — Addendum Note (Signed)
Addended byDierdre Searles: Kinzley Savell T on: 07/31/2015 05:36 PM   Modules accepted: Kipp BroodSmartSet

## 2015-07-31 NOTE — Progress Notes (Signed)
Patient's here to re-est care with PCP.  Patient c/o constant heavy vaginal bleeding x8wks post vaginal delivery.  Patient declines A1c screening.

## 2015-08-21 ENCOUNTER — Ambulatory Visit: Payer: Medicaid Other | Attending: Internal Medicine | Admitting: Internal Medicine

## 2015-08-21 ENCOUNTER — Other Ambulatory Visit: Payer: Self-pay | Admitting: *Deleted

## 2015-08-21 ENCOUNTER — Encounter: Payer: Self-pay | Admitting: Internal Medicine

## 2015-08-21 ENCOUNTER — Telehealth: Payer: Self-pay | Admitting: Internal Medicine

## 2015-08-21 ENCOUNTER — Ambulatory Visit (HOSPITAL_BASED_OUTPATIENT_CLINIC_OR_DEPARTMENT_OTHER): Payer: Medicaid Other | Admitting: Clinical

## 2015-08-21 VITALS — BP 119/85 | HR 82 | Temp 98.2°F | Resp 18 | Ht 64.0 in | Wt 184.2 lb

## 2015-08-21 DIAGNOSIS — F411 Generalized anxiety disorder: Secondary | ICD-10-CM

## 2015-08-21 DIAGNOSIS — Z79899 Other long term (current) drug therapy: Secondary | ICD-10-CM | POA: Insufficient documentation

## 2015-08-21 DIAGNOSIS — O99345 Other mental disorders complicating the puerperium: Secondary | ICD-10-CM | POA: Diagnosis not present

## 2015-08-21 DIAGNOSIS — F419 Anxiety disorder, unspecified: Secondary | ICD-10-CM | POA: Insufficient documentation

## 2015-08-21 DIAGNOSIS — R350 Frequency of micturition: Secondary | ICD-10-CM | POA: Insufficient documentation

## 2015-08-21 DIAGNOSIS — R197 Diarrhea, unspecified: Secondary | ICD-10-CM | POA: Insufficient documentation

## 2015-08-21 DIAGNOSIS — J45909 Unspecified asthma, uncomplicated: Secondary | ICD-10-CM | POA: Diagnosis not present

## 2015-08-21 DIAGNOSIS — T887XXA Unspecified adverse effect of drug or medicament, initial encounter: Secondary | ICD-10-CM

## 2015-08-21 DIAGNOSIS — F410 Panic disorder [episodic paroxysmal anxiety] without agoraphobia: Secondary | ICD-10-CM | POA: Diagnosis not present

## 2015-08-21 DIAGNOSIS — F53 Postpartum depression: Secondary | ICD-10-CM

## 2015-08-21 DIAGNOSIS — I10 Essential (primary) hypertension: Secondary | ICD-10-CM

## 2015-08-21 DIAGNOSIS — F329 Major depressive disorder, single episode, unspecified: Secondary | ICD-10-CM | POA: Diagnosis present

## 2015-08-21 DIAGNOSIS — T50905A Adverse effect of unspecified drugs, medicaments and biological substances, initial encounter: Secondary | ICD-10-CM

## 2015-08-21 DIAGNOSIS — J4521 Mild intermittent asthma with (acute) exacerbation: Secondary | ICD-10-CM | POA: Insufficient documentation

## 2015-08-21 LAB — POCT URINALYSIS DIPSTICK
Bilirubin, UA: NEGATIVE
Blood, UA: NEGATIVE
GLUCOSE UA: NEGATIVE
Ketones, UA: NEGATIVE
LEUKOCYTES UA: NEGATIVE
Nitrite, UA: NEGATIVE
Protein, UA: NEGATIVE
SPEC GRAV UA: 1.01
UROBILINOGEN UA: NEGATIVE
pH, UA: 6.5

## 2015-08-21 MED ORDER — FLUOXETINE HCL 60 MG PO TABS: 60.0000 mg | ORAL_TABLET | Freq: Every day | ORAL | Status: DC

## 2015-08-21 MED ORDER — FLUOXETINE HCL 20 MG PO TABS: 20.0000 mg | ORAL_TABLET | Freq: Every day | ORAL | Status: DC

## 2015-08-21 MED ORDER — FLUTICASONE PROPIONATE 50 MCG/ACT NA SUSP: 2.0000 | Freq: Every day | NASAL | Status: DC

## 2015-08-21 MED ORDER — SACCHAROMYCES BOULARDII 250 MG PO CAPS: 250.0000 mg | ORAL_CAPSULE | Freq: Two times a day (BID) | ORAL | Status: DC

## 2015-08-21 MED ORDER — PREDNISONE 20 MG PO TABS: 20.0000 mg | ORAL_TABLET | Freq: Every day | ORAL | Status: DC

## 2015-08-21 MED ORDER — ALBUTEROL SULFATE (2.5 MG/3ML) 0.083% IN NEBU
2.5000 mg | INHALATION_SOLUTION | Freq: Once | RESPIRATORY_TRACT | Status: AC
Start: 2015-08-21 — End: 2015-08-21
  Administered 2015-08-21: 2.5 mg via RESPIRATORY_TRACT

## 2015-08-21 MED FILL — ?FLUOXETINE HCL 20MG TABLET: 20 | 30 days supply | Qty: 90 | Fill #0

## 2015-08-21 NOTE — Progress Notes (Signed)
Patient is here for tongue numbness  Patient complains of tongue numbness being present since last Tuesday. Patient denies biting the tongue, eating anything extremely hot or cold. Patient complains of dizziness with spots and diarrhea present for the past 2 months QD.   Patient has recently just stopped post partum bleeding.  Patient has taken medications today and patient has eaten.  Patient tolerated nebulizer treatment well. Patient admits to feeling better.

## 2015-08-21 NOTE — Progress Notes (Signed)
ASSESSMENT: Pt experiencing symptoms of postpartum depression and postpartum anxiety. Pt needs to f/u with PCP and establish BH med management; would benefit from brief therapeutic interventions and community resources regarding coping with symptoms of anxiety and depression.  Stage of Change: contemplative  PLAN: 1. F/U with behavioral health consultant in as needed 2. Psychiatric Medications: Prozac, Zoloft. 3. Behavioral recommendation(s):   -Continue to call mother and nap when baby naps, daily -Consider Feelings After Birth support group -Consider [employer] warm-line for 24hr emotional support at [phone] -Go to [facility] new patient visit SUBJECTIVE: Pt. referred by Dr Julien NordmannLangeland for postnatal depression and anxiety :  Pt. reports the following symptoms/concerns: Pt states that she is sleeping better than at last visit, that she and baby are sleeping well, still tired at times, still calling mother routinely, will think about attending support group and/or call support line, as needed, for additional support.  Duration of problem: At least two months Severity: severe  OBJECTIVE: Orientation & Cognition: Oriented x3. Thought processes normal and appropriate to situation. Mood: appropriate. Affect: appropriate Appearance: appropriate Risk of harm to self or others: no known risk of harm to self or others Substance use: tobacco Assessments administered: none  Diagnosis: Postpartum depression/Postpartum anxiety CPT Code: F53/ O99.345 -------------------------------------------- Other(s) present in the room: 747yo son, 1711-week-old daughter  Time spent with patient in exam room: 16 minutes, 3:20-3:36pm   [facility] 07/31/2015 06/06/2015 06/02/2015 05/30/2015 05/26/2015  Decreased Interest 3 0 0 0 0  Down, Depressed, Hopeless 3 0 0 0 0  PHQ - 2 Score 6 0 0 0 0  Altered sleeping 3 - - - -  Tired, decreased energy 3 -  - - -  Change in appetite 2 - - - -  Feeling bad or failure about yourself  2 - - - -  Trouble concentrating 3 - - - -  Moving slowly or fidgety/restless 2 - - - -  Suicidal thoughts 0 - - - -  PHQ-9 Score 21 - - - -

## 2015-08-21 NOTE — Addendum Note (Signed)
Addended by: Margaretmary LombardLISBON, Garren Greenman K on: 08/21/2015 04:07 PM   Modules accepted: Orders

## 2015-08-21 NOTE — Progress Notes (Signed)
[name], is a 35 y.o. female  [id number]  [id number]  DOB - 12/30/1980  Chief Complaint  Patient presents with  . Numbness    tongue        Subjective:   [name] is a 35 y.o. female here today for a follow up visit.  Last seen in office 3/13 and started on 2 hypertensives, bb and norvasc, which she has been on for about 2 wks now. Pt started developing tip of tongue numbess and flushing about 1 wk now.   Also since last visit, patient decribes more periods of "panic attacks" when out shopping and anxiety.  She is still anxious and has periods of crying sometimes, but denies any SI/HI/AH/VH.  She is currently on  Fluoxetine and zoloft, but interested in upping the prescription for short course.  Also states that past few days - 1 wk, more nasal congestion and slightly DOE.  No f/c/cough though. + diarrhea x 2 months now, loose, constant, watery stools, w/ increase frequency in urination  Patient has No headache, No chest pain, No abdominal pain - No Nausea, No new weakness tingling or numbness, No Cough - SOB.  Her 24month baby is with her, as well as her son, who is turning 8yo soon.   No problems updated.  ALLERGIES: Allergies  Allergen Reactions  . Norvasc [Amlodipine Besylate] Other (See Comments)    Hypersensitivity? Tip of tongue numb and flushing.    PAST MEDICAL HISTORY: Past Medical History  Diagnosis Date  . Asthma   . Hypertension   . Anxiety   . Pregnancy induced hypertension   . Kidney stones   . Ovarian cyst   . Vaginal Pap smear, abnormal     bx, f/u ok  . Depression     doing good, not on meds now  . Missed abortion 10/02/2013  . Headache     migraines    MEDICATIONS AT HOME: Prior to Admission medications   Medication Sig Start Date End Date Taking? Authorizing Provider  FLUoxetine HCl 60 MG TABS Take 60 mg by mouth daily. 08/21/15  Yes Pete Glatterawn T Creta Dorame, MD  metoprolol tartrate (LOPRESSOR) 25 MG tablet Take 1 tablet (25 mg  total) by mouth 2 (two) times daily. 07/31/15  Yes Pete Glatterawn T Phelan Schadt, MD  montelukast (SINGULAIR) 10 MG tablet Take 1 tablet (10 mg total) by mouth at bedtime. 07/31/15  Yes Mende Biswell Marland Mcalpine Jalina Blowers, MD  Prenat-FeCbn-FeAsp-Meth-FA-DHA (PRENATE MINI) 18-0.6-0.4-350 MG CAPS Take 1 capsule by mouth daily. Reported on 07/31/2015 07/07/15  Yes Historical Provider, MD  PROAIR HFA 108 (90 Base) MCG/ACT inhaler USE 2 PUFFS EVERY 2 HOURS AS NEEDED FOR WHEEZING OR SHORTNESS OF BREATH 06/21/15  Yes Historical Provider, MD  PULMICORT FLEXHALER 180 MCG/ACT inhaler INHALE 1 PUFF INTO THE LUNGS TWICE DAILY 06/12/15  Yes Historical Provider, MD  QVAR 80 MCG/ACT inhaler INHALE 2 PUFFS INTO THE LUNGS 2 (TWO) TIMES DAILY. 07/07/15  Yes Historical Provider, MD  sertraline (ZOLOFT) 50 MG tablet Take 50 mg by mouth daily. 07/15/15  Yes Historical Provider, MD  traZODone (DESYREL) 50 MG tablet Take 50 mg by mouth at bedtime. Reported on 07/31/2015 06/25/15  Yes Historical Provider, MD  fluticasone (FLONASE) 50 MCG/ACT nasal spray Place 2 sprays into both nostrils daily. 08/21/15   Pete Glatterawn T Drayton Tieu, MD  oxyCODONE-acetaminophen (PERCOCET/ROXICET) 5-325 MG tablet Take 1 tablet by mouth every 4 (four) hours as needed. Reported on 08/21/2015 06/09/15   Historical Provider, MD  predniSONE (DELTASONE) 20 MG tablet  Take 1 tablet (20 mg total) by mouth daily with breakfast. Day 1 & 2 take 2 tabs (=40mg  ) in am daily, than take  tabs daily til done with prescription. For asthma. 08/21/15   Pete Glatter, MD  saccharomyces boulardii (FLORASTOR) 250 MG capsule Take 1 capsule (250 mg total) by mouth 2 (two) times daily. 08/21/15   Pete Glatter, MD     Objective:   Filed Vitals:   08/21/15 1454  BP: 119/85  Pulse: 82  Temp: 98.2 F (36.8 C)  TempSrc: Oral  Resp: 18  Height:  (1.626 m)  Weight: 184 lb 3.2 oz (83.553 kg)  SpO2: 97%    Exam General appearance : Awake, alert, not in any distress. Speech Clear. Not toxic looking,  appears  under stress, pleasant. HEENT: Atraumatic and Normocephalic, pupils equally reactive to light. Neck: supple, no JVD. No cervical lymphadenopathy.  Chest: tight, with diffuse experitory wheezes  Noted, no c/r. CVS: S1 S2 regular, no murmurs/gallups or rubs. Abdomen: Bowel sounds active, Non tender and not distended with no gaurding, rigidity or rebound. Extremities: B/L Lower Ext shows no edema, both legs are warm to touch Neurology: Awake alert, and oriented X 3, CN II-XII grossly intact, Non focal Skin:No Rash  Data Review Lab Results  Component Value Date   HGBA1C 5.2 07/29/2014     Assessment & Plan   1. Postnatal depression, with worsening "panic attacks" and anxiety - denies si/hi/ah/vh, understandable with new baby and son. She looks stress - Jamie cw cs, appreciate assistance - increase fluoxetine to 60 mg po qdy, cont zoloft 50  2. Asthma with acute exacerbation, mild intermittent - duoneb treatment now, 330pm/ pt examined again, breathing much better, lungs much clearer, no wheezing noted now. - short prednisone taper ordered - continue proair prn, and pulmicort at home - flonase nasal spray  3. Hypersensitivity to norvasc? + tongue tip numbness w/ flushing, may be 2nd to norvasc, dc  4. htn - now controlled, but will chk to see how she dose w/ only bb, and off norvasc  5. Diarrhea x 2 months now after meals, IBS? From stres? Lots of stressors, Trial probiotics/florastor for now, relaxation techniques.  6. Frequent urination - chk ua. - negative today.  Patient have been counseled extensively about nutrition and exercise  Return to clinic about 1 month, prn.  The patient was given clear instructions to go to ER or return to medical center if symptoms don't improve, worsen or new problems develop. The patient verbalized understanding. The patient was told to call to get lab results if they haven't heard anything in the next week.   Pete Glatter, MD,  MBA/MHA [facility] [place], [place] [phone]   08/21/2015, 3:05 PM

## 2015-08-21 NOTE — Telephone Encounter (Signed)
Prozac 20 mg was reordered in order for patient to receive 60 mg dosage.

## 2015-08-21 NOTE — Patient Instructions (Addendum)
-Bronchospasm, Adult A bronchospasm is when the tubes that carry air in and out of your lungs (airways) spasm or tighten. During a bronchospasm it is hard to breathe. This is because the airways get smaller. A bronchospasm can be triggered by:  Allergies. These may be to animals, pollen, food, or mold.  Infection. This is a common cause of bronchospasm.  Exercise.  Irritants. These include pollution, cigarette smoke, strong odors, aerosol sprays, and paint fumes.  Weather changes.  Stress.  Being emotional. HOME CARE   Always have a plan for getting help. Know when to call your doctor and local emergency services (911 in the [place] Know where you can get emergency care.  Only take medicines as told by your doctor.  If you were prescribed an inhaler or nebulizer machine, ask your doctor how to use it correctly. Always use a spacer with your inhaler if you were given one.  Stay calm during an attack. Try to relax and breathe more slowly.  Control your home environment:  Change your heating and air conditioning filter at least once a month.  Limit your use of fireplaces and wood stoves.  Do not  smoke. Do not  allow smoking in your home.  Avoid perfumes and fragrances.  Get rid of pests (such as roaches and mice) and their droppings.  Throw away plants if you see mold on them.  Keep your house clean and dust free.  Replace carpet with wood, tile, or vinyl flooring. Carpet can trap dander and dust.  Use allergy-proof pillows, mattress covers, and box spring covers.  Wash bed sheets and blankets every week in hot water. Dry them in a dryer.  Use blankets that are made of polyester or cotton.  Wash hands frequently. GET HELP IF:  You have muscle aches.  You have chest pain.  The thick spit you spit or cough up (sputum) changes from clear or white to yellow, green, gray, or bloody.  The thick spit you spit or cough up gets thicker.  There are problems that may be  related to the medicine you are given such as:  A rash.  Itching.  Swelling.  Trouble breathing. GET HELP RIGHT AWAY IF:  You feel you cannot breathe or catch your breath.  You cannot stop coughing.  Your treatment is not helping you breathe better.  You have very bad chest pain. MAKE SURE YOU:   Understand these instructions.  Will watch your condition.  Will get help right away if you are not doing well or get worse.   This information is not intended to replace advice given to you by your health care provider. Make sure you discuss any questions you have with your health care provider.   Document Released: 03/03/2009 Document Revised: 05/27/2014 Document Reviewed: 10/27/2012 Elsevier Interactive Patient Education 2016 [place] and Stress Management Stress is a normal reaction to life events. It is what you feel when life demands more than you are used to or more than you can handle. Some stress can be useful. For example, the stress reaction can help you catch the last bus of the day, study for a test, or meet a deadline at work. But stress that occurs too often or for too long can cause problems. It can affect your emotional health and interfere with relationships and normal daily activities. Too much stress can weaken your immune system and increase your risk for physical illness. If you already have a medical problem, stress  can make it worse. CAUSES  All sorts of life events may cause stress. An event that causes stress for one person may not be stressful for another person. Major life events commonly cause stress. These may be positive or negative. Examples include losing your job, moving into a new home, getting married, having a baby, or losing a loved one. Less obvious life events may also cause stress, especially if they occur day after day or in combination. Examples include working long hours, driving in traffic, caring for children, being in debt, or  being in a difficult relationship. SIGNS AND SYMPTOMS Stress may cause emotional symptoms including, the following:  Anxiety. This is feeling worried, afraid, on edge, overwhelmed, or out of control.  Anger. This is feeling irritated or impatient.  Depression. This is feeling sad, down, helpless, or guilty.  Difficulty focusing, remembering, or making decisions. Stress may cause physical symptoms, including the following:   Aches and pains. These may affect your head, neck, back, stomach, or other areas of your body.  Tight muscles or clenched jaw.  Low energy or trouble sleeping. Stress may cause unhealthy behaviors, including the following:   Eating to feel better (overeating) or skipping meals.  Sleeping too little, too much, or both.  Working too much or putting off tasks (procrastination).  Smoking, drinking alcohol, or using drugs to feel better. DIAGNOSIS  Stress is diagnosed through an assessment by your health care provider. Your health care provider will ask questions about your symptoms and any stressful life events.Your health care provider will also ask about your medical history and may order blood tests or other tests. Certain medical conditions and medicine can cause physical symptoms similar to stress. Mental illness can cause emotional symptoms and unhealthy behaviors similar to stress. Your health care provider may refer you to a mental health professional for further evaluation.  TREATMENT  Stress management is the recommended treatment for stress.The goals of stress management are reducing stressful life events and coping with stress in healthy ways.  Techniques for reducing stressful life events include the following:  Stress identification. Self-monitor for stress and identify what causes stress for you. These skills may help you to avoid some stressful events.  Time management. Set your priorities, keep a calendar of events, and learn to say "no." These  tools can help you avoid making too many commitments. Techniques for coping with stress include the following:  Rethinking the problem. Try to think realistically about stressful events rather than ignoring them or overreacting. Try to find the positives in a stressful situation rather than focusing on the negatives.  Exercise. Physical exercise can release both physical and emotional tension. The key is to find a form of exercise you enjoy and do it regularly.  Relaxation techniques. These relax the body and mind. Examples include yoga, meditation, tai chi, biofeedback, deep breathing, progressive muscle relaxation, listening to music, being out in nature, journaling, and other hobbies. Again, the key is to find one or more that you enjoy and can do regularly.  Healthy lifestyle. Eat a balanced diet, get plenty of sleep, and do not smoke. Avoid using alcohol or drugs to relax.  Strong support network. Spend time with family, friends, or other people you enjoy being around.Express your feelings and talk things over with someone you trust. Counseling or talktherapy with a mental health professional may be helpful if you are having difficulty managing stress on your own. Medicine is typically not recommended for the treatment of stress.Talk to  your health care provider if you think you need medicine for symptoms of stress. HOME CARE INSTRUCTIONS  Keep all follow-up visits as directed by your health care provider.  Take all medicines as directed by your health care provider. SEEK MEDICAL CARE IF:  Your symptoms get worse or you start having new symptoms.  You feel overwhelmed by your problems and can no longer manage them on your own. SEEK IMMEDIATE MEDICAL CARE IF:  You feel like hurting yourself or someone else.   This information is not intended to replace advice given to you by your health care provider. Make sure you discuss any questions you have with your health care  provider.  - Diet for Irritable Bowel Syndrome ( ?? Maybe) When you have irritable bowel syndrome (IBS), the foods you eat and your eating habits are very important. IBS may cause various symptoms, such as abdominal pain, constipation, or diarrhea. Choosing the right foods can help ease discomfort caused by these symptoms. Work with your health care provider and dietitian to find the best eating plan to help control your symptoms. WHAT GENERAL GUIDELINES DO I NEED TO FOLLOW?  Keep a food diary. This will help you identify foods that cause symptoms. Write down:  What you eat and when.  What symptoms you have.  When symptoms occur in relation to your meals.  Avoid foods that cause symptoms. Talk with your dietitian about other ways to get the same nutrients that are in these foods.  Eat more foods that contain fiber. Take a fiber supplement if directed by your dietitian.  Eat your meals slowly, in a relaxed setting.  Aim to eat 5-6 small meals per day. Do not skip meals.  Drink enough fluids to keep your urine clear or pale yellow.  Ask your health care provider if you should take an over-the-counter probiotic during flare-ups to help restore healthy gut bacteria.  If you have cramping or diarrhea, try making your meals low in fat and high in carbohydrates. Examples of carbohydrates are pasta, rice, whole grain breads and cereals, fruits, and vegetables.  If dairy products cause your symptoms to flare up, try eating less of them. You might be able to handle yogurt better than other dairy products because it contains bacteria that help with digestion. WHAT FOODS ARE NOT RECOMMENDED? The following are some foods and drinks that may worsen your symptoms:  Fatty foods, such as [place] fries.  Milk products, such as cheese or ice cream.  Chocolate.  Alcohol.  Products with caffeine, such as coffee.  Carbonated drinks, such as soda. The items listed above may not be a complete list  of foods and beverages to avoid. Contact your dietitian for more information. WHAT FOODS ARE GOOD SOURCES OF FIBER? Your health care provider or dietitian may recommend that you eat more foods that contain fiber. Fiber can help reduce constipation and other IBS symptoms. Add foods with fiber to your diet a little at a time so that your body can get used to them. Too much fiber at once might cause gas and swelling of your abdomen. The following are some foods that are good sources of fiber:  Apples.  Peaches.  Pears.  Berries.  Figs.  Broccoli (raw).  Cabbage.  Carrots.  Raw peas.  Kidney beans.  Lima beans.  Whole grain bread.  Whole grain cereal. FOR MORE INFORMATION  International Foundation for Functional Gastrointestinal Disorders: www.iffgd.[employer] of Diabetes and Digestive and Kidney Diseases: NetworkAffair.co.za.aspx  This information is not intended to replace advice given to you by your health care provider. Make sure you discuss any questions you have with your health care provider.   Document Released: 07/27/2003 Document Revised: 05/27/2014 Document Reviewed: 08/06/2013 Elsevier Interactive Patient Education 2016 [place] Released: 10/30/2000 Document Revised: 05/27/2014 Document Reviewed: 12/29/2012 Elsevier Interactive Patient Education [employer].

## 2015-08-31 MED FILL — ?MONTELUKAST SOD 10 MG TAB: 10 | 30 days supply | Qty: 30 | Fill #1

## 2015-08-31 MED FILL — ?METOPROLOL 25 MG TABLET: 25 | 30 days supply | Qty: 60 | Fill #1

## 2015-09-12 ENCOUNTER — Emergency Department (HOSPITAL_COMMUNITY): Payer: Medicaid Other

## 2015-09-12 ENCOUNTER — Emergency Department (HOSPITAL_COMMUNITY)
Admission: EM | Admit: 2015-09-12 | Discharge: 2015-09-12 | Disposition: A | Discharge status: Home / Self Care | Payer: Medicaid Other | Attending: Emergency Medicine | Admitting: Emergency Medicine

## 2015-09-12 ENCOUNTER — Encounter (HOSPITAL_COMMUNITY): Payer: Self-pay

## 2015-09-12 DIAGNOSIS — Z7951 Long term (current) use of inhaled steroids: Secondary | ICD-10-CM | POA: Diagnosis not present

## 2015-09-12 DIAGNOSIS — I10 Essential (primary) hypertension: Secondary | ICD-10-CM | POA: Insufficient documentation

## 2015-09-12 DIAGNOSIS — F17210 Nicotine dependence, cigarettes, uncomplicated: Secondary | ICD-10-CM | POA: Diagnosis not present

## 2015-09-12 DIAGNOSIS — Z87442 Personal history of urinary calculi: Secondary | ICD-10-CM | POA: Insufficient documentation

## 2015-09-12 DIAGNOSIS — Z79899 Other long term (current) drug therapy: Secondary | ICD-10-CM | POA: Insufficient documentation

## 2015-09-12 DIAGNOSIS — F329 Major depressive disorder, single episode, unspecified: Secondary | ICD-10-CM | POA: Diagnosis not present

## 2015-09-12 DIAGNOSIS — Z8742 Personal history of other diseases of the female genital tract: Secondary | ICD-10-CM | POA: Insufficient documentation

## 2015-09-12 DIAGNOSIS — R059 Cough, unspecified: Secondary | ICD-10-CM

## 2015-09-12 DIAGNOSIS — R05 Cough: Secondary | ICD-10-CM

## 2015-09-12 DIAGNOSIS — F419 Anxiety disorder, unspecified: Secondary | ICD-10-CM | POA: Insufficient documentation

## 2015-09-12 DIAGNOSIS — J45901 Unspecified asthma with (acute) exacerbation: Secondary | ICD-10-CM | POA: Insufficient documentation

## 2015-09-12 DIAGNOSIS — R062 Wheezing: Secondary | ICD-10-CM

## 2015-09-12 MED ORDER — ALBUTEROL SULFATE (2.5 MG/3ML) 0.083% IN NEBU: 5.0000 mg | INHALATION_SOLUTION | Freq: Four times a day (QID) | RESPIRATORY_TRACT | Status: DC | PRN

## 2015-09-12 MED ORDER — IPRATROPIUM BROMIDE 0.02 % IN SOLN
0.5000 mg | Freq: Once | RESPIRATORY_TRACT | Status: AC
  Administered 2015-09-12: 0.5 mg via RESPIRATORY_TRACT
  Filled 2015-09-12: qty 2.5

## 2015-09-12 MED ORDER — BENZONATATE 100 MG PO CAPS: 100.0000 mg | ORAL_CAPSULE | Freq: Three times a day (TID) | ORAL | Status: DC

## 2015-09-12 MED ORDER — PREDNISONE 20 MG PO TABS
60.0000 mg | ORAL_TABLET | Freq: Once | ORAL | Status: AC
  Administered 2015-09-12: 60 mg via ORAL
  Filled 2015-09-12: qty 3

## 2015-09-12 MED ORDER — ALBUTEROL SULFATE (2.5 MG/3ML) 0.083% IN NEBU
5.0000 mg | INHALATION_SOLUTION | Freq: Once | RESPIRATORY_TRACT | Status: AC
  Administered 2015-09-12: 5 mg via RESPIRATORY_TRACT
  Filled 2015-09-12: qty 6

## 2015-09-12 MED ORDER — PREDNISONE 20 MG PO TABS: ORAL_TABLET | ORAL | Status: DC

## 2015-09-12 NOTE — Discharge Instructions (Signed)
May use nebulizer treatments at home every 4-6 hours as needed.  Continue using inhalers for rescue. Start prednisone tomorrow, you have already had today's dose. Take tessalon to help with cough. Follow-up with your primary care physician. Return here for any new/worsening symptoms.

## 2015-09-12 NOTE — ED Notes (Signed)
Pt with cough starting yesterday.  Pt with hx of asthma.  Using 3 different inhalers.  Cough is productive.  Unknown for fevers.  States gets light headed when she is coughing.

## 2015-09-12 NOTE — ED Provider Notes (Signed)
CSN: [id number]     Arrival date & time 09/12/15  1131 History   First MD Initiated Contact with Patient 09/12/15 1502     Chief Complaint  Patient presents with  . Cough     (Consider location/radiation/quality/duration/timing/severity/associated sxs/prior Treatment) Patient is a 35 y.o. female presenting with cough. The history is provided by the patient and medical records.  Cough Associated symptoms: wheezing     35 y.o. F with hx of asthma, HTN, anxiety, kidney stones, depression, migraine headaches, presenting to the ED for cough and wheezing.  Patient states symptoms have been ongoing for a few days now.  Cough is dry, non-productive.  Denies fever but reports some chills.  States daughter was sick with bronchiolitis a few weeks ago, unsure if she picked this up from her.  Denies chest pain, SOB, palpitations, dizziness, or weakness.  States when she has heavy bouts of coughing she often feels lightheaded.  Denies any feelings of syncope or LOC.  States she has been using her home proair and pulmicort inhalers without significant relief.  Patient is an active daily smoker.  States she did not smoke at all yesterday but continues to wheeze.  VSS.  Past Medical History  Diagnosis Date  . Asthma   . Hypertension   . Anxiety   . Pregnancy induced hypertension   . Kidney stones   . Ovarian cyst   . Vaginal Pap smear, abnormal     bx, f/u ok  . Depression     doing good, not on meds now  . Missed abortion 10/02/2013  . Headache     migraines   Past Surgical History  Procedure Laterality Date  . Cesarean section    . Dilation and curettage of uterus    . Dilation and evacuation N/A 10/04/2013    Procedure: DILATATION AND EVACUATION;  Surgeon: Sherian ReinJody Bovard-Stuckert, MD;  Location: [facility];  Service: Gynecology;  Laterality: N/A;  US in room   . Cesarean section N/A 06/06/2015    Procedure: CESAREAN SECTION;  Surgeon: Kathreen CosierBernard A Marshall, MD;  Location: [facility];  Service: Obstetrics;   Laterality: N/A;   Family History  Problem Relation Age of Onset  . Hearing loss Mother   . Asthma Mother   . Diabetes Son   . Cancer Maternal Grandfather     bone  . Liver disease Maternal Grandfather   . Asthma Father   . Asthma Sister   . Lupus Sister    Social History  Substance Use Topics  . Smoking status: Current Some Day Smoker -- 0.50 packs/day for 15 years    Types: Cigarettes  . Smokeless tobacco: Never Used     Comment: Smoking .5 ppd  . Alcohol Use: No     Comment: very occassionally   OB History    Gravida Para Term Preterm AB TAB SAB Ectopic Multiple Living   4 2 2  0 2 1 1  0 0 2     Review of Systems  Respiratory: Positive for cough and wheezing.   All other systems reviewed and are negative.     Allergies  Norvasc  Home Medications   Prior to Admission medications   Medication Sig Start Date End Date Taking? Authorizing Provider  FLUoxetine (PROZAC) 20 MG tablet Take 1 tablet (20 mg total) by mouth daily. Take 3 tablets ONCE daily to equal 60 mg TOTAL 08/21/15   Quentin Angstlugbemiga E Jegede, MD  fluticasone (FLONASE) 50 MCG/ACT nasal spray Place 2 sprays into both  nostrils daily. 08/21/15   Pete Glatter, MD  metoprolol tartrate (LOPRESSOR) 25 MG tablet Take 1 tablet (25 mg total) by mouth 2 (two) times daily. 07/31/15   Pete Glatter, MD  montelukast (SINGULAIR) 10 MG tablet Take 1 tablet (10 mg total) by mouth at bedtime. 07/31/15   Pete Glatter, MD  oxyCODONE-acetaminophen (PERCOCET/ROXICET) 5-325 MG tablet Take 1 tablet by mouth every 4 (four) hours as needed. Reported on 08/21/2015 06/09/15   Historical Provider, MD  predniSONE (DELTASONE) 20 MG tablet Take 1 tablet (20 mg total) by mouth daily with breakfast. Day 1 & 2 take 2 tabs (=40mg  ) in am daily, than take  tabs daily til done with prescription. For asthma. 08/21/15   Quentin Angst, MD  Prenat-FeCbn-FeAsp-Meth-FA-DHA (PRENATE MINI) 18-0.6-0.4-350 MG CAPS Take 1 capsule by mouth daily.  Reported on 07/31/2015 07/07/15   Historical Provider, MD  PROAIR HFA 108 (90 Base) MCG/ACT inhaler USE 2 PUFFS EVERY 2 HOURS AS NEEDED FOR WHEEZING OR SHORTNESS OF BREATH 06/21/15   Historical Provider, MD  PULMICORT FLEXHALER 180 MCG/ACT inhaler INHALE 1 PUFF INTO THE LUNGS TWICE DAILY 06/12/15   Historical Provider, MD  QVAR 80 MCG/ACT inhaler INHALE 2 PUFFS INTO THE LUNGS 2 (TWO) TIMES DAILY. 07/07/15   Historical Provider, MD  saccharomyces boulardii (FLORASTOR) 250 MG capsule Take 1 capsule (250 mg total) by mouth 2 (two) times daily. 08/21/15   Pete Glatter, MD  sertraline (ZOLOFT) 50 MG tablet Take 50 mg by mouth daily. 07/15/15   Historical Provider, MD  traZODone (DESYREL) 50 MG tablet Take 50 mg by mouth at bedtime. Reported on 07/31/2015 06/25/15   Historical Provider, MD   BP 146/103 mmHg  Pulse 66  Temp(Src) 97.8 F (36.6 C) (Oral)  Resp 16  Ht  (1.626 m)  Wt 82.555 kg  BMI 31.22 kg/m2  SpO2 96%   Physical Exam  Constitutional: She is oriented to person, place, and time. She appears well-developed and well-nourished. No distress.  HENT:  Head: Normocephalic and atraumatic.  Mouth/Throat: Oropharynx is clear and moist.  Eyes: Conjunctivae and EOM are normal. Pupils are equal, round, and reactive to light.  Neck: Normal range of motion. Neck supple.  Cardiovascular: Normal rate, regular rhythm and normal heart sounds.   Pulmonary/Chest: Effort normal. She has wheezes.  Diffuse expiratory wheezes throughout, no distress, speaking in full sentences without difficulty, dry cough present  Abdominal: Soft. Bowel sounds are normal. There is no tenderness. There is no guarding.  Musculoskeletal: Normal range of motion. She exhibits no edema.  Neurological: She is alert and oriented to person, place, and time.  Skin: Skin is warm and dry. She is not diaphoretic.  Psychiatric: She has a normal mood and affect.  Nursing note and vitals reviewed.   ED Course  Procedures (including  critical care time) Labs Review Labs Reviewed - No data to display  Imaging Review Dg Chest 2 View  09/12/2015  CLINICAL DATA:  Cough, wheezing and chest pain. EXAM: CHEST  2 VIEW COMPARISON:  May 19, 2015 FINDINGS: The heart size and mediastinal contours are within normal limits. There is no focal infiltrate, pulmonary edema, or pleural effusion. The visualized skeletal structures are unremarkable. IMPRESSION: No active cardiopulmonary disease. Electronically Signed   By: Sherian Rein M.D.   On: 09/12/2015 13:17   I have personally reviewed and evaluated these images and lab results as part of my medical decision-making.   EKG Interpretation None  MDM   Final diagnoses:  Cough  Wheezing   35 year old female here with cough. She is afebrile, nontoxic. She does have diffuse expiratory wheezes throughout on exam but is not in any acute distress.  Vital signs are stable. Chest x-ray was obtained which is negative for acute findings. Patient was treated with albuterol and atrovent nebs x2 as well as prednisone with vast improvement of symptoms.  States she feels better.  VS remain stable.  Does have neb machine at home, will write for albuterol solution, prednisone taper, and tessalon.  Follow-up with PCP.  Discussed plan with patient, he/she acknowledged understanding and agreed with plan of care.  Return precautions given for new or worsening symptoms.  Garlon Hatchet, PA-C 09/12/15 2117  Tilden Fossa, MD 09/13/15 [phone]

## 2015-10-17 MED FILL — ?METOPROLOL 25 MG TABLET: 25 | 30 days supply | Qty: 60 | Fill #2

## 2015-10-18 ENCOUNTER — Other Ambulatory Visit: Payer: Medicaid Other

## 2015-10-18 ENCOUNTER — Encounter (INDEPENDENT_AMBULATORY_CARE_PROVIDER_SITE_OTHER): Payer: Self-pay

## 2015-10-18 ENCOUNTER — Encounter: Payer: Self-pay | Admitting: Internal Medicine

## 2015-10-18 ENCOUNTER — Ambulatory Visit (INDEPENDENT_AMBULATORY_CARE_PROVIDER_SITE_OTHER): Payer: Medicaid Other | Admitting: Internal Medicine

## 2015-10-18 VITALS — BP 140/88 | HR 61 | Temp 98.7°F | Ht 64.0 in | Wt 183.8 lb

## 2015-10-18 DIAGNOSIS — Z832 Family history of diseases of the blood and blood-forming organs and certain disorders involving the immune mechanism: Secondary | ICD-10-CM | POA: Diagnosis not present

## 2015-10-18 DIAGNOSIS — J4530 Mild persistent asthma, uncomplicated: Secondary | ICD-10-CM | POA: Diagnosis not present

## 2015-10-18 LAB — NITRIC OXIDE: NITRIC OXIDE: 7

## 2015-10-18 MED ORDER — CLOTRIMAZOLE 10 MG MT TROC: 10.0000 mg | Freq: Every day | OROMUCOSAL | Status: DC

## 2015-10-18 NOTE — Progress Notes (Signed)
History of Present Illness [name] is a 35 y.o. female with consult for her asthma. She is using pulmocort, albuterol and QVar 80. She does take Singulair.She states she feels short of breath. Her throat is sore when she wakes in the morning, and dry. She has a slight cough and wheezing day and night. Cough is non-productive. She has never been allergy tested.She denies leg or calf pain. She states she has had this shortness of breath for a few years.Nitrous oxide today was 7.Her sister does have lupus.   10/18/2015  CT Angio 04/14/2015  Tests IMPRESSION: 1. No CT evidence for acute pulmonary embolus. 2. 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 This pattern can be seen in cases of small airways disease (hypersensitivity pneumonitis, respiratory bronchiolitis, and connective tissue disease). Chronic pulmonary embolism can also generate this appearance, but is considered less likely in this individual.  Past medical hx Past Medical History  Diagnosis Date  . Asthma   . Hypertension   . Anxiety   . Pregnancy induced hypertension   . Kidney stones   . Ovarian cyst   . Vaginal Pap smear, abnormal     bx, f/u ok  . Depression     doing good, not on meds now  . Missed abortion 10/02/2013  . Headache     migraines     Past surgical hx, Family hx, Social hx all reviewed.  Current Outpatient Prescriptions on File Prior to Visit  Medication Sig  . albuterol (PROVENTIL) (2.5 MG/3ML) 0.083% nebulizer solution Take 6 mLs (5 mg total) by nebulization every 6 (six) hours as needed for wheezing or shortness of breath.  . benzonatate (TESSALON) 100 MG capsule Take 1 capsule (100 mg total) by mouth every 8 (eight) hours.  Marland Kitchen. FLUoxetine (PROZAC) 20 MG tablet Take 1 tablet (20 mg total) by mouth daily. Take 3 tablets ONCE daily to equal 60 mg TOTAL (Patient taking differently: Take 60 mg by mouth daily. Take 3 tablets ONCE daily to equal 60 mg TOTAL)  .  fluticasone (FLONASE) 50 MCG/ACT nasal spray Place 2 sprays into both nostrils daily.  . metoprolol tartrate (LOPRESSOR) 25 MG tablet Take 1 tablet (25 mg total) by mouth 2 (two) times daily.  . montelukast (SINGULAIR) 10 MG tablet Take 1 tablet (10 mg total) by mouth at bedtime. (Patient taking differently: Take 10 mg by mouth daily. )  . Prenat-FeCbn-FeAsp-Meth-FA-DHA (PRENATE MINI) 18-0.6-0.4-350 MG CAPS Take 1 capsule by mouth daily. Reported on 07/31/2015  . PULMICORT FLEXHALER 180 MCG/ACT inhaler INHALE 1 PUFF INTO THE LUNGS TWICE DAILY  . QVAR 80 MCG/ACT inhaler INHALE 2 PUFFS INTO THE LUNGS 2 (TWO) TIMES DAILY.   No current facility-administered medications on file prior to visit.     Allergies  Allergen Reactions  . Norvasc [Amlodipine Besylate] Other (See Comments)    Hypersensitivity? Tip of tongue numb and flushing.    Review Of Systems:  Review of Systems  Constitutional: Negative for fever, chills and unexpected weight change.  HENT: Positive for ear pain, sneezing and sore throat. Negative for congestion, dental problem, nosebleeds, postnasal drip, rhinorrhea, sinus pressure, trouble swallowing and voice change.   Eyes: Negative for visual disturbance.  Respiratory: Positive for shortness of breath. Negative for cough and choking.   Cardiovascular: Positive for chest pain. Negative for leg swelling.  Gastrointestinal: Negative for vomiting, abdominal pain and diarrhea.  Genitourinary: Negative for difficulty urinating.  Musculoskeletal: Negative for arthralgias.  Skin: Negative for rash.  Neurological: Negative for tremors, syncope and headaches.  Hematological: Does not bruise/bleed easily.   Vital Signs BP 140/88 mmHg  Pulse 61  Temp(Src) 98.7 F (37.1 C) (Oral)  Ht  (1.626 m)  Wt 183 lb 12.8 oz (83.371 kg)  BMI 31.53 kg/m2  SpO2 98%   Physical Exam:  General- No distress,  A&Ox3, very pleasant ENT: No sinus tenderness, TM clear, pale nasal mucosa, no  oral exudate,no post nasal drip, no LAN, faint upper airway  Cardiac: S1, S2, regular rate and rhythm, no murmur Chest: No wheeze/ rales/ dullness; no accessory muscle use, no nasal flaring, no sternal retractions Abd.: Soft Non-tender Ext: No clubbing cyanosis, edema Neuro:  normal strength Skin:  Rashes of pregnancy across nose and cheeks., warm and dry Psych: normal mood and behavior   Assessment/Plan  Asthma, chronic Continued shortness of breath despite use of Pulmicort,albuterol,QVAR and Singulair. Plan: Nitrous Oxide Testing today PFT's prior to next visit Stop taking thePulmicort and Singulair. Continue QVAR  for now. Labwork today: ANA, double stranded DNA, RF, CCP Follow up in 3-4 weeks Please contact office for sooner follow up if symptoms do not improve or worsen or seek emergency care        Melissa Ngo, NP 10/18/2015  5:31 PM      STAFF NOTE: I, Dr Lavinia Sharps have personally reviewed patient's available data, including medical history, events of note, physical examination and test results as part of my evaluation. I have discussed with resident/NP and other care providers such as pharmacist, RN and RRT.  In addition,  I personally evaluated patient and elicited key findings of   S: chronic cough x years. Has dx of asthma and is on pulmicort, qvar and singulair. Gave birth to 2nd child 4 months ago. New malar rash since then. Some arthralgia past few months. Sister has SLE  O: pleasant Clear throat No wheeze Mild malar rash v chloasma Joints ok  feno 7  Ct angio chest nov 2016 - no pe. 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 Malar rash  P: dc pulmicort and singulair given normal Feno. Ok for QVAR for now and monitor Check autoimmune Check full PFT rov in few weeks   .  Rest per NP/medical resident whose note is outlined above and that I agree with    Dr. Kalman Shan, M.D.,  [facility].C.P Pulmonary and Critical Care Medicine Staff Physician [facility] Pager: [phone], If no answer or between  15:00h - 7:00h: call [phone]  10/18/2015 7:36 PM

## 2015-10-18 NOTE — Progress Notes (Signed)
   Subjective:    Patient ID: Zigmund DanielEllenjane F Fasching, female    DOB: 05-04-1981, 35 y.o.   MRN: [id number]  HPI    Review of Systems  Constitutional: Negative for fever, chills and unexpected weight change.  HENT: Positive for ear pain, sneezing and sore throat. Negative for congestion, dental problem, nosebleeds, postnasal drip, rhinorrhea, sinus pressure, trouble swallowing and voice change.   Eyes: Negative for visual disturbance.  Respiratory: Positive for shortness of breath. Negative for cough and choking.   Cardiovascular: Positive for chest pain. Negative for leg swelling.  Gastrointestinal: Negative for vomiting, abdominal pain and diarrhea.  Genitourinary: Negative for difficulty urinating.  Musculoskeletal: Negative for arthralgias.  Skin: Negative for rash.  Neurological: Negative for tremors, syncope and headaches.  Hematological: Does not bruise/bleed easily.       Objective:   Physical Exam        Assessment & Plan:

## 2015-10-18 NOTE — Patient Instructions (Addendum)
PFT's prior to next visit Stop taking thePulmicort and Singulair. Continue QVAR  for now. Labwork today: ANA, double stranded DNA, RF, CCP Follow up in 3-4 weeks Mycelex Troaches 5 tabs daily x 7 days. Please contact office for sooner follow up if symptoms do not improve or worsen or seek emergency care

## 2015-10-18 NOTE — Assessment & Plan Note (Addendum)
Continued shortness of breath despite use of Pulmicort,albuterol,QVAR and Singulair. Plan: Nitrous Oxide Testing today PFT's prior to next visit Stop taking thePulmicort and Singulair. Continue QVAR  for now. Labwork today: ANA, double stranded DNA, RF, CCP Follow up in 3-4 weeks Please contact office for sooner follow up if symptoms do not improve or worsen or seek emergency care

## 2015-10-19 ENCOUNTER — Telehealth: Payer: Self-pay | Admitting: Internal Medicine

## 2015-10-19 ENCOUNTER — Other Ambulatory Visit: Payer: Medicaid Other

## 2015-10-19 NOTE — Telephone Encounter (Signed)
Spoke with pt. She is aware of the below information. Nothing further was needed. 

## 2015-10-19 NOTE — Telephone Encounter (Signed)
Please let her know that we will talk about allergy testing at her 3 week follow up. Thanks so much

## 2015-10-19 NOTE — Telephone Encounter (Signed)
Spoke with pt. She is wanting to know if she needs to have allergy testing. States Maralyn SagoSarah told her that she would need to have this done. There is no documentation of this.  SG - please advise. Thanks.

## 2015-10-20 ENCOUNTER — Telehealth: Payer: Self-pay | Admitting: Internal Medicine

## 2015-10-20 LAB — ANTI-DNA ANTIBODY, DOUBLE-STRANDED: ds DNA Ab: 1 IU/mL

## 2015-10-20 LAB — CYCLIC CITRUL PEPTIDE ANTIBODY, IGG

## 2015-10-20 LAB — RHEUMATOID FACTOR: Rhuematoid fact SerPl-aCnc: <10 IU/mL (ref <=14)

## 2015-10-20 LAB — ANA: Anti Nuclear Antibody(ANA): NEGATIVE

## 2015-10-20 NOTE — Telephone Encounter (Signed)
Notes Recorded by Kalman ShanMurali Ramaswamy, MD on 10/20/2015 at 3:39 PM Autoimmune negative for RA and SLE ------------------------------------- Pt is aware of results. Nothing further was needed.

## 2015-10-31 ENCOUNTER — Ambulatory Visit: Payer: Medicaid Other | Attending: Internal Medicine | Admitting: Internal Medicine

## 2015-10-31 ENCOUNTER — Encounter: Payer: Self-pay | Admitting: Internal Medicine

## 2015-10-31 VITALS — BP 137/87 | HR 68 | Temp 98.1°F | Wt 184.6 lb

## 2015-10-31 DIAGNOSIS — F53 Postpartum depression: Secondary | ICD-10-CM

## 2015-10-31 DIAGNOSIS — O99345 Other mental disorders complicating the puerperium: Secondary | ICD-10-CM

## 2015-10-31 DIAGNOSIS — I10 Essential (primary) hypertension: Secondary | ICD-10-CM | POA: Insufficient documentation

## 2015-10-31 DIAGNOSIS — J4530 Mild persistent asthma, uncomplicated: Secondary | ICD-10-CM

## 2015-10-31 LAB — BASIC METABOLIC PANEL WITH GFR
BUN: 14 mg/dL (ref 7–25)
CALCIUM: 9.4 mg/dL (ref 8.6–10.2)
CO2: 25 mmol/L (ref 20–31)
CREATININE: 1.09 mg/dL (ref 0.50–1.10)
Chloride: 103 mmol/L (ref 98–110)
GFR, EST AFRICAN AMERICAN: 78 mL/min (ref >=60)
GFR, Est Non African American: 67 mL/min (ref >=60)
Glucose, Bld: 80 mg/dL (ref 65–99)
POTASSIUM: 4.3 mmol/L (ref 3.5–5.3)
Sodium: 139 mmol/L (ref 135–146)

## 2015-10-31 MED ORDER — HYDROCHLOROTHIAZIDE 12.5 MG PO CAPS: 12.5000 mg | ORAL_CAPSULE | Freq: Every day | ORAL | Status: DC

## 2015-10-31 MED ORDER — FLUOXETINE HCL 20 MG PO TABS: 60.0000 mg | ORAL_TABLET | Freq: Every day | ORAL | Status: DC

## 2015-10-31 MED FILL — ?FLUOXETINE HCL 20MG TABLET: 20 | 30 days supply | Qty: 90 | Fill #0

## 2015-10-31 MED FILL — ?HYDROCHLOROTHIAZIDE 12.5MG: 12.5 | 30 days supply | Qty: 30 | Fill #0

## 2015-10-31 NOTE — Patient Instructions (Signed)
Postpartum Depression and Baby Blues The postpartum period begins right after the birth of a baby. During this time, there is often a great amount of joy and excitement. It is also a time of many changes in the life of the parents. Regardless of how many times a mother gives birth, each child brings new challenges and dynamics to the family. It is not unusual to have feelings of excitement along with confusing shifts in moods, emotions, and thoughts. All mothers are at risk of developing postpartum depression or the "baby blues." These mood changes can occur right after giving birth, or they may occur many months after giving birth. The baby blues or postpartum depression can be mild or severe. Additionally, postpartum depression can go away rather quickly, or it can be a long-term condition.  CAUSES Raised hormone levels and the rapid drop in those levels are thought to be a main cause of postpartum depression and the baby blues. A number of hormones change during and after pregnancy. Estrogen and progesterone usually decrease right after the delivery of your baby. The levels of thyroid hormone and various cortisol steroids also rapidly drop. Other factors that play a role in these mood changes include major life events and genetics.  RISK FACTORS If you have any of the following risks for the baby blues or postpartum depression, know what symptoms to watch out for during the postpartum period. Risk factors that may increase the likelihood of getting the baby blues or postpartum depression include:  Having a personal or family history of depression.   Having depression while being pregnant.   Having premenstrual mood issues or mood issues related to oral contraceptives.  Having a lot of life stress.   Having marital conflict.   Lacking a social support network.   Having a baby with special needs.   Having health problems, such as diabetes.  SIGNS AND SYMPTOMS Symptoms of baby blues  include:  Brief changes in mood, such as going from extreme happiness to sadness.  Decreased concentration.   Difficulty sleeping.   Crying spells, tearfulness.   Irritability.   Anxiety.  Symptoms of postpartum depression typically begin within the first month after giving birth. These symptoms include:  Difficulty sleeping or excessive sleepiness.   Marked weight loss.   Agitation.   Feelings of worthlessness.   Lack of interest in activity or food.  Postpartum psychosis is a very serious condition and can be dangerous. Fortunately, it is rare. Displaying any of the following symptoms is cause for immediate medical attention. Symptoms of postpartum psychosis include:   Hallucinations and delusions.   Bizarre or disorganized behavior.   Confusion or disorientation.  DIAGNOSIS  A diagnosis is made by an evaluation of your symptoms. There are no medical or lab tests that lead to a diagnosis, but there are various questionnaires that a health care provider may use to identify those with the baby blues, postpartum depression, or psychosis. Often, a screening tool called the Edinburgh Postnatal Depression Scale is used to diagnose depression in the postpartum period.  TREATMENT The baby blues usually goes away on its own in 1-2 weeks. Social support is often all that is needed. You will be encouraged to get adequate sleep and rest. Occasionally, you may be given medicines to help you sleep.  Postpartum depression requires treatment because it can last several months or longer if it is not treated. Treatment may include individual or group therapy, medicine, or both to address any social, physiological, and psychological   factors that may play a role in the depression. Regular exercise, a healthy diet, rest, and social support may also be strongly recommended.  Postpartum psychosis is more serious and needs treatment right away. Hospitalization is often needed. HOME CARE  INSTRUCTIONS  Get as much rest as you can. Nap when the baby sleeps.   Exercise regularly. Some women find yoga and walking to be beneficial.   Eat a balanced and nourishing diet.   Do little things that you enjoy. Have a cup of tea, take a bubble bath, read your favorite magazine, or listen to your favorite music.  Avoid alcohol.   Ask for help with household chores, cooking, grocery shopping, or running errands as needed. Do not try to do everything.   Talk to people close to you about how you are feeling. Get support from your partner, family members, friends, or other new moms.  Try to stay positive in how you think. Think about the things you are grateful for.   Do not spend a lot of time alone.   Only take over-the-counter or prescription medicine as directed by your health care provider.  Keep all your postpartum appointments.   Let your health care provider know if you have any concerns.  SEEK MEDICAL CARE IF: You are having a reaction to or problems with your medicine. SEEK IMMEDIATE MEDICAL CARE IF:  You have suicidal feelings.   You think you may harm the baby or someone else. MAKE SURE YOU:  Understand these instructions.  Will watch your condition.  Will get help right away if you are not doing well or get worse.   This information is not intended to replace advice given to you by your health care provider. Make sure you discuss any questions you have with your health care provider.   Document Released: 02/08/2004 Document Revised: 05/11/2013 Document Reviewed: 02/15/2013 Elsevier Interactive Patient Education 2016 Elsevier Inc.  - Generalized Anxiety Disorder Generalized anxiety disorder (GAD) is a mental disorder. It interferes with life functions, including relationships, work, and school. GAD is different from normal anxiety, which everyone experiences at some point in their lives in response to specific life events and activities. Normal  anxiety actually helps us prepare for and get through these life events and activities. Normal anxiety goes away after the event or activity is over.  GAD causes anxiety that is not necessarily related to specific events or activities. It also causes excess anxiety in proportion to specific events or activities. The anxiety associated with GAD is also difficult to control. GAD can vary from mild to severe. People with severe GAD can have intense waves of anxiety with physical symptoms (panic attacks).  SYMPTOMS The anxiety and worry associated with GAD are difficult to control. This anxiety and worry are related to many life events and activities and also occur more days than not for 6 months or longer. People with GAD also have three or more of the following symptoms (one or more in children):  Restlessness.   Fatigue.  Difficulty concentrating.   Irritability.  Muscle tension.  Difficulty sleeping or unsatisfying sleep. DIAGNOSIS GAD is diagnosed through an assessment by your health care provider. Your health care provider will ask you questions aboutyour mood,physical symptoms, and events in your life. Your health care provider may ask you about your medical history and use of alcohol or drugs, including prescription medicines. Your health care provider may also do a physical exam and blood tests. Certain medical conditions and the use  certain substances can cause symptoms similar to those associated with GAD. Your health care provider may refer you to a mental health specialist for further evaluation. TREATMENT The following therapies are usually used to treat GAD:   Medication. Antidepressant medication usually is prescribed for long-term daily control. Antianxiety medicines may be added in severe cases, especially when panic attacks occur.   Talk therapy (psychotherapy). Certain types of talk therapy can be helpful in treating GAD by providing support, education, and guidance. A  form of talk therapy called cognitive behavioral therapy can teach you healthy ways to think about and react to daily life events and activities.  Stress managementtechniques. These include yoga, meditation, and exercise and can be very helpful when they are practiced regularly. A mental health specialist can help determine which treatment is best for you. Some people see improvement with one therapy. However, other people require a combination of therapies.   This information is not intended to replace advice given to you by your health care provider. Make sure you discuss any questions you have with your health care provider.   Document Released: 08/31/2012 Document Revised: 05/27/2014 Document Reviewed: 08/31/2012 Elsevier Interactive Patient Education 2016 Elsevier Inc.   

## 2015-10-31 NOTE — Progress Notes (Signed)
[name], is a 35 y.o. female  ZOX:[id number]  [id number]  DOB - May 08, 1981  Chief Complaint  Patient presents with  . Hypertension    Stressed        Subjective:   [name] is a 35 y.o. female here today for a follow up visit for htn and depression/anxiety.  Per pt, she has been up late last few nights taking care of young son, who's insulin pump is malfunctioning.  She is going to see his Endocrine doctor after this appt.  She saw her Ob-gyn Dr Gaynell FaceMarshall, last Thursday, had papsmear done, pending results, but has possible uti so started on macrobid. Denies dysuria.     Patient has No headache, No chest pain, No abdominal pain - No Nausea, No new weakness tingling or numbness, No Cough - SOB.  No problems updated.  ALLERGIES: Allergies  Allergen Reactions  . Norvasc [Amlodipine Besylate] Other (See Comments)    Hypersensitivity? Tip of tongue numb and flushing.    PAST MEDICAL HISTORY: Past Medical History  Diagnosis Date  . Asthma   . Hypertension   . Anxiety   . Pregnancy induced hypertension   . Kidney stones   . Ovarian cyst   . Vaginal Pap smear, abnormal     bx, f/u ok  . Depression     doing good, not on meds now  . Missed abortion 10/02/2013  . Headache     migraines    MEDICATIONS AT HOME: Prior to Admission medications   Medication Sig Start Date End Date Taking? Authorizing Provider  albuterol (PROVENTIL) (2.5 MG/3ML) 0.083% nebulizer solution Take 6 mLs (5 mg total) by nebulization every 6 (six) hours as needed for wheezing or shortness of breath. 09/12/15  Yes Garlon HatchetLisa M Sanders, PA-C  FLUoxetine (PROZAC) 20 MG tablet Take 3 tablets (60 mg total) by mouth daily. Take 3 tablets ONCE daily to equal 60 mg TOTAL 10/31/15  Yes Pete Glatterawn T Morena Mckissack, MD  metoprolol tartrate (LOPRESSOR) 25 MG tablet Take 1 tablet (25 mg total) by mouth 2 (two) times daily. 07/31/15  Yes Azael Ragain Marland Mcalpine Louie Flenner, MD  Prenat-FeCbn-FeAsp-Meth-FA-DHA (PRENATE MINI)  18-0.6-0.4-350 MG CAPS Take 1 capsule by mouth daily. Reported on 07/31/2015 07/07/15  Yes Historical Provider, MD  benzonatate (TESSALON) 100 MG capsule Take 1 capsule (100 mg total) by mouth every 8 (eight) hours. Patient not taking: Reported on 10/31/2015 09/12/15   Garlon HatchetLisa M Sanders, PA-C  clotrimazole [facility](MYCELEX) 10 MG troche Take 1 tablet (10 mg total) by mouth 5 (five) times daily. Patient not taking: Reported on 10/31/2015 10/18/15   Kalman ShanMurali Ramaswamy, MD  fluticasone [facility](FLONASE) 50 MCG/ACT nasal spray Place 2 sprays into both nostrils daily. Patient not taking: Reported on 10/31/2015 08/21/15   Pete Glatterawn T Galdino Hinchman, MD  hydrochlorothiazide (MICROZIDE) 12.5 MG capsule Take 1 capsule (12.5 mg total) by mouth daily. 10/31/15   Pete Glatterawn T Chrishelle Zito, MD  montelukast (SINGULAIR) 10 MG tablet Take 1 tablet (10 mg total) by mouth at bedtime. Patient not taking: Reported on 10/31/2015 07/31/15   Pete Glatterawn T Lukah Goswami, MD  nitrofurantoin, macrocrystal-monohydrate, (MACROBID) 100 MG capsule Take 100 mg by mouth 2 (two) times daily. 10/26/15   Historical Provider, MD  Steffanie RainwaterPULMICORT FLEXHALER 180 MCG/ACT inhaler Reported on 10/31/2015 06/12/15   Historical Provider, MD  QVAR 80 MCG/ACT inhaler Reported on 10/31/2015 07/07/15   Historical Provider, MD     Objective:   Filed Vitals:   10/31/15 1440 10/31/15 1448  BP: 151/92 137/87  Pulse: 67 68  Temp: 98.1 F (36.7 C)   TempSrc: Oral   Weight: 184 lb 9.6 oz (83.734 kg)     Exam General appearance : Awake, alert, not in any distress. Speech Clear. Not toxic looking, obese, pleasant. HEENT: Atraumatic and Normocephalic,  Neck: supple, no JVD.  Chest:Good air entry bilaterally, no added sounds. CVS: S1 S2 regular, no murmurs/gallups or rubs. Abdomen: Bowel sounds active, Non tender and not distended  Extremities: B/L Lower Ext shows no edema, both legs are warm to touch Neurology: Awake alert, and oriented X 3, CN II-XII grossly intact, Non focal Skin:No Rash  Data Review Lab  Results  Component Value Date   HGBA1C 5.2 07/29/2014    Depression screen [facility] 2/9 10/31/2015 07/31/2015 06/06/2015 06/02/2015 05/30/2015  Decreased Interest 2 3 0 0 0  Down, Depressed, Hopeless 3 3 0 0 0  PHQ - 2 Score 5 6 0 0 0  Altered sleeping 3 3 - - -  Tired, decreased energy 3 3 - - -  Change in appetite 3 2 - - -  Feeling bad or failure about yourself  0 2 - - -  Trouble concentrating 3 3 - - -  Moving slowly or fidgety/restless 3 2 - - -  Suicidal thoughts 0 0 - - -  PHQ-9 Score 20 21 - - -  Difficult doing work/chores Very difficult - - - -      Assessment & Plan   1. Essential hypertension, benign - Repeat bit better today, low salt diet discussed - no complaints w/ resp status on bb, continue lopressor 25bid - hctz 12.5 added - chk bmp   2. Postpartum depression/anxiety, about same - currently patient says ob took her off Zoloft this past Thurs. - Ambulatory referral to Psychiatry -  A lot of stressors in family - denies si/hi/avh. - continue prozac 60 qday for now.  3. Asthma, chronic, mild persistent, uncomplicated Much improved on exam today, did not need nebs trx. - recent eval by Pulm, appreciated, now only on Qvar. - allergy testing per pulm at next appt.  4. Health maintenance - per pt, had papsmear done Thurs 10/26/15 w/ her OB, Dr Gaynell Face, per pt results still pending, not in our system.    Patient have been counseled extensively about nutrition and exercise  Return in about 4 weeks (around 11/28/2015) for anxiety/htn.  The patient was given clear instructions to go to ER or return to medical center if symptoms don't improve, worsen or new problems develop. The patient verbalized understanding. The patient was told to call to get lab results if they haven't heard anything in the next week.    Pete Glatter, MD, MBA/MHA [facility] and [facility] [place], [place] [phone]   10/31/2015, 3:00 PM

## 2015-11-03 ENCOUNTER — Telehealth: Payer: Self-pay | Admitting: Internal Medicine

## 2015-11-03 NOTE — Telephone Encounter (Signed)
We have never prescribed this medication for her - will forward request to Dr. Julien NordmannLangeland.

## 2015-11-03 NOTE — Telephone Encounter (Signed)
Patient called requesting a refill for Trazadone. Please follow up.

## 2015-11-04 NOTE — Telephone Encounter (Signed)
We did no prescribe.  Please have her f/u up w/ psychiatry or DR that prescribed. Thanks

## 2015-11-08 NOTE — Telephone Encounter (Signed)
Clld pt - LMOVMTC re refill request on Trazodone.

## 2015-11-09 ENCOUNTER — Encounter: Payer: Self-pay | Admitting: Acute Care

## 2015-11-10 NOTE — Telephone Encounter (Signed)
Dr. Marchelle Gearingamaswamy, patient is requesting a refill on her Trazodone.  [facility] but has not been able to get in touch with them, so she wants to know if you can send in a refill for her.  She has been struggling with sleep without it.   Please advise.

## 2015-11-13 ENCOUNTER — Encounter: Payer: Self-pay | Admitting: Acute Care

## 2015-11-13 ENCOUNTER — Ambulatory Visit (INDEPENDENT_AMBULATORY_CARE_PROVIDER_SITE_OTHER): Payer: Medicaid Other | Admitting: Acute Care

## 2015-11-13 VITALS — BP 112/80 | HR 63 | Temp 98.3°F | Ht 64.0 in | Wt 181.0 lb

## 2015-11-13 DIAGNOSIS — J4530 Mild persistent asthma, uncomplicated: Secondary | ICD-10-CM | POA: Diagnosis not present

## 2015-11-13 NOTE — Patient Instructions (Addendum)
It is good to see you today. Continue your Qvar 2 puffs twice daily as previously ordered. We will add Claritin 10 mg once daily. It is fine to use generic We will add Protonix 40 mg daily. Ok to use generic. Take once daily 30 minutes before meal. We will do a FENO test today to check for airway inflammation. Please pre-certify for a  Xopenex inhaler as the Albuterol causes anxiety. PFT's this Thursday as are scheduled. Dr. Marchelle Gearingamaswamy or his nurse will call you with PFT results. 6 month follow up with Dr. Marchelle Gearingamaswamy

## 2015-11-13 NOTE — Telephone Encounter (Signed)
Clld pt - advsd of PCP's notation. Pt stated she does not have a Psychiatrist and would like to be referred to one due to her having previously receiving medication from this office  4 years ago. Pt advsd referral  Request would be sent to PCP and she would receive a telephone call from our referral specialist once it has been set up. Pt stated she understood.

## 2015-11-13 NOTE — Assessment & Plan Note (Signed)
  Continue QVAR 2 puffs  twice daily. We will add Claritin 10 mg once daily. It is fine to use generic We will add Protonix 40 mg daily. Ok to use generic. Take once daily 30 minutes before meal. We will do a FENO test today to check for airway inflammation. Please pre-certify for a  Xopenex inhaler as the Albuterol causes anxiety. PFT's this Thursday as are scheduled. Dr. Marchelle Gearingamaswamy or his nurse will call you with PFT results. 6 month follow up with Dr. Marchelle Gearingamaswamy Please contact office for sooner follow up if symptoms do not improve or worsen or seek emergency care

## 2015-11-13 NOTE — Progress Notes (Signed)
History of Present Illness [name] is a 35 y.o. female with asthma followed by Dr. Marchelle Gearingamaswamy   6/26/2017Follow Up Appointment for Asthma:  Pt. Returns  to the office today for follow up. She has stopped taking  her Pulmicort, and Singulair per Dr. Jane Canaryamaswamy's recommendation. She states that she is using her QVAR daily as prescribed.She states that she is using her Albuterol inhaler about 2-3 times weekly. She states she continues to  have  some shortness of breath with exertion and at night when laying down.She denies any chest pain, fever, orthopnea, or hemoptysis. She denies leg or calf pain. She does have a slight cough that she feels is worse with the heat.We discussed the lab work done  at the last appointment. She is in the office today with her 1095 month old daughter.  Tests Results for Melissa DanielSOMERS, Marilene F (MRN [id number]) as of 11/13/2015 12:22  Ref. Range 10/18/2015 17:34  Anit Nuclear Antibody(ANA) Latest Ref Range: NEGATIVE  NEG  Cyclic Citrullin Peptide Ab Latest Units: Units <16  ds DNA Ab Latest Units: IU/mL 1  RA Latex Turbid.  Latest Ref Range: <=14 IU/mL <10     09/12/2015 CXR  FINDINGS: The heart size and mediastinal contours are within normal limits. There is no focal infiltrate, pulmonary edema, or pleural effusion. The visualized skeletal structures are unremarkable.  IMPRESSION: No active cardiopulmonary disease.  Past medical hx Past Medical History  Diagnosis Date  . Asthma   . Hypertension   . Anxiety   . Pregnancy induced hypertension   . Kidney stones   . Ovarian cyst   . Vaginal Pap smear, abnormal     bx, f/u ok  . Depression     doing good, not on meds now  . Missed abortion 10/02/2013  . Headache     migraines     Past surgical hx, Family hx, Social hx all reviewed.  Current Outpatient Prescriptions on File Prior to Visit  Medication Sig  . albuterol (PROVENTIL) (2.5 MG/3ML) 0.083% nebulizer solution Take 6 mLs (5 mg total) by  nebulization every 6 (six) hours as needed for wheezing or shortness of breath.  Marland Kitchen. FLUoxetine (PROZAC) 20 MG tablet Take 3 tablets (60 mg total) by mouth daily. Take 3 tablets ONCE daily to equal 60 mg TOTAL  . fluticasone (FLONASE) 50 MCG/ACT nasal spray Place 2 sprays into both nostrils daily.  . hydrochlorothiazide (MICROZIDE) 12.5 MG capsule Take 1 capsule (12.5 mg total) by mouth daily.  . metoprolol tartrate (LOPRESSOR) 25 MG tablet Take 1 tablet (25 mg total) by mouth 2 (two) times daily.  . nitrofurantoin, macrocrystal-monohydrate, (MACROBID) 100 MG capsule Take 100 mg by mouth 2 (two) times daily.  . Prenat-FeCbn-FeAsp-Meth-FA-DHA (PRENATE MINI) 18-0.6-0.4-350 MG CAPS Take 1 capsule by mouth daily. Reported on 07/31/2015  . QVAR 80 MCG/ACT inhaler Reported on 11/13/2015  . benzonatate (TESSALON) 100 MG capsule Take 1 capsule (100 mg total) by mouth every 8 (eight) hours. (Patient not taking: Reported on 10/31/2015)  . clotrimazole (MYCELEX) 10 MG troche Take 1 tablet (10 mg total) by mouth 5 (five) times daily. (Patient not taking: Reported on 10/31/2015)  . montelukast (SINGULAIR) 10 MG tablet Take 1 tablet (10 mg total) by mouth at bedtime. (Patient not taking: Reported on 11/13/2015)  . PULMICORT FLEXHALER 180 MCG/ACT inhaler Reported on 11/13/2015   No current facility-administered medications on file prior to visit.     Allergies  Allergen Reactions  . Norvasc [Amlodipine Besylate] Other (See  Comments)    Hypersensitivity? Tip of tongue numb and flushing.    Review Of Systems:  Constitutional:   No  weight loss, night sweats,  Fevers, chills, fatigue, or  lassitude.  HEENT:   No headaches,  Difficulty swallowing,  Tooth/dental problems, or  Sore throat,                No sneezing, itching, ear ache, nasal congestion, post nasal drip,   CV:  No chest pain,  Orthopnea, PND, swelling in lower extremities, anasarca, dizziness, palpitations, syncope.   GI  No heartburn, indigestion,  abdominal pain, nausea, vomiting, diarrhea, change in bowel habits, loss of appetite, bloody stools.   Resp: + shortness of breath with exertion not at rest.  No excess mucus, no productive cough,  + non-productive cough at times,  No coughing up of blood.  No change in color of mucus.  No wheezing.  No chest wall deformity  Skin: no rash or lesions.  GU: no dysuria, change in color of urine, no urgency or frequency.  No flank pain, no hematuria   MS:  No joint pain or swelling.  No decreased range of motion.  No back pain.  Psych:  No change in mood or affect. No depression or anxiety.  No memory loss.   Vital Signs BP 112/80 mmHg  Pulse 63  Temp(Src) 98.3 F (36.8 C) (Oral)  Ht 5\' 4"  (1.626 m)  Wt 181 lb (82.101 kg)  BMI 31.05 kg/m2  SpO2 98%   Physical Exam:  General- No distress,  A&Ox3, pleasant and anxious ENT: No sinus tenderness, TM clear, pale nasal mucosa, no oral exudate,no post nasal drip, no LAN Cardiac: S1, S2, regular rate and rhythm, no murmur Chest: No wheeze/ rales/ dullness; no accessory muscle use, no nasal flaring, no sternal retractions Abd.: Soft Non-tender Ext: No clubbing cyanosis, edema Neuro:  normal strength Skin: No rashes, warm and dry Psych: normal mood and behavior   Assessment/Plan  Asthma, chronic  Continue QVAR 2 puffs  twice daily. We will add Claritin 10 mg once daily. It is fine to use generic We will add Protonix 40 mg daily. Ok to use generic. Take once daily 30 minutes before meal. We will do a FENO test today to check for airway inflammation. Please pre-certify for a  Xopenex inhaler as the Albuterol causes anxiety. PFT's this Thursday as are scheduled. Dr. Marchelle Gearingamaswamy or his nurse will call you with PFT results. 6 month follow up with Dr. Marchelle Gearingamaswamy Please contact office for sooner follow up if symptoms do not improve or worsen or seek emergency care        Bevelyn NgoSarah F Groce, NP 11/13/2015  9:07 PM

## 2015-11-16 ENCOUNTER — Ambulatory Visit (HOSPITAL_COMMUNITY)
Admission: RE | Admit: 2015-11-16 | Discharge: 2015-11-16 | Disposition: A | Discharge status: Home / Self Care | Payer: Medicaid Other | Source: Ambulatory Visit | Attending: Internal Medicine | Admitting: Internal Medicine

## 2015-11-16 DIAGNOSIS — J4530 Mild persistent asthma, uncomplicated: Secondary | ICD-10-CM | POA: Diagnosis present

## 2015-11-16 DIAGNOSIS — Z832 Family history of diseases of the blood and blood-forming organs and certain disorders involving the immune mechanism: Secondary | ICD-10-CM | POA: Insufficient documentation

## 2015-11-16 LAB — PULMONARY FUNCTION TEST
DL/VA % pred: 97 %
DL/VA: 4.67 ml/min/mmHg/L
DLCO UNC % PRED: 95 %
DLCO unc: 23.29 ml/min/mmHg
FEF 25-75 Pre: 1.03 L/sec
FEF2575-%Pred-Pre: 30 %
FEV1-%PRED-PRE: 62 %
FEV1-Pre: 1.96 L
FEV1FVC-%PRED-PRE: 66 %
FEV6-%PRED-PRE: 93 %
FEV6-Pre: 3.46 L
FEV6FVC-%Pred-Pre: 98 %
FVC-%Pred-Pre: 94 %
FVC-Pre: 3.54 L
PRE FEV1/FVC RATIO: 55 %
Pre FEV6/FVC Ratio: 98 %
RV % pred: 292 %
RV: 4.21 L
TLC % PRED: 160 %
TLC: 8.11 L

## 2015-11-16 NOTE — Telephone Encounter (Signed)
1) trazadone only via PCP Pete Glatterawn T Langeland, MD    2) PFt shows moderate obstruction but given feeling well we can keep an eye on this. Change ROV to 3months with spiro at fu

## 2015-12-03 ENCOUNTER — Emergency Department (HOSPITAL_COMMUNITY)
Admission: EM | Admit: 2015-12-03 | Discharge: 2015-12-03 | Disposition: A | Discharge status: Home / Self Care | Payer: Medicaid Other | Attending: Emergency Medicine | Admitting: Emergency Medicine

## 2015-12-03 ENCOUNTER — Encounter (HOSPITAL_COMMUNITY): Payer: Self-pay

## 2015-12-03 DIAGNOSIS — F329 Major depressive disorder, single episode, unspecified: Secondary | ICD-10-CM | POA: Insufficient documentation

## 2015-12-03 DIAGNOSIS — I10 Essential (primary) hypertension: Secondary | ICD-10-CM | POA: Diagnosis not present

## 2015-12-03 DIAGNOSIS — Z79899 Other long term (current) drug therapy: Secondary | ICD-10-CM | POA: Diagnosis not present

## 2015-12-03 DIAGNOSIS — N390 Urinary tract infection, site not specified: Secondary | ICD-10-CM | POA: Diagnosis not present

## 2015-12-03 DIAGNOSIS — F17210 Nicotine dependence, cigarettes, uncomplicated: Secondary | ICD-10-CM | POA: Insufficient documentation

## 2015-12-03 DIAGNOSIS — R300 Dysuria: Secondary | ICD-10-CM | POA: Diagnosis present

## 2015-12-03 DIAGNOSIS — J45909 Unspecified asthma, uncomplicated: Secondary | ICD-10-CM | POA: Insufficient documentation

## 2015-12-03 LAB — URINALYSIS, ROUTINE W REFLEX MICROSCOPIC
BILIRUBIN URINE: NEGATIVE
Glucose, UA: NEGATIVE mg/dL
KETONES UR: NEGATIVE mg/dL
Nitrite: NEGATIVE
PROTEIN: 30 mg/dL — AB
Specific Gravity, Urine: 1.015 (ref 1.005–1.030)
pH: 6.5 (ref 5.0–8.0)

## 2015-12-03 LAB — URINE MICROSCOPIC-ADD ON

## 2015-12-03 LAB — POC URINE PREG, ED: Preg Test, Ur: NEGATIVE

## 2015-12-03 MED ORDER — CEPHALEXIN 500 MG PO CAPS
500.0000 mg | ORAL_CAPSULE | Freq: Once | ORAL | Status: AC
  Administered 2015-12-03: 500 mg via ORAL
  Filled 2015-12-03: qty 1

## 2015-12-03 MED ORDER — CEPHALEXIN 500 MG PO CAPS: 500.0000 mg | ORAL_CAPSULE | Freq: Two times a day (BID) | ORAL | Status: DC

## 2015-12-03 MED ORDER — PHENAZOPYRIDINE HCL 100 MG PO TABS
100.0000 mg | ORAL_TABLET | Freq: Three times a day (TID) | ORAL | Status: DC
  Administered 2015-12-03: 100 mg via ORAL
  Filled 2015-12-03: qty 1

## 2015-12-03 NOTE — ED Provider Notes (Signed)
CSN: [id number]     Arrival date & time 12/03/15  1227 History   First MD Initiated Contact with Patient 12/03/15 1233     Chief Complaint  Patient presents with  . Abdominal Pain  . Back Pain     (Consider location/radiation/quality/duration/timing/severity/associated sxs/prior Treatment) The history is provided by the patient and medical records.    35 year old female with history of asthma, hypertension, anxiety, kidney stones, depression, presenting to the ED for abdominal pain and back pain. Patient states this is been ongoing intermittently for the past several months since the birth of her daughter in January via c-section.  This was without complication. She states she has had suprapubic pressure, dysuria, urinary frequency, and sensation of incompletely emptying her bladder. She denies any nausea, vomiting, or diarrhea. She reports subjective fever and chills.  States she was seen by her OB/GYN for the same and started on antibiotic but does not remember which one this was. She states she has been taking over-the-counter AZO as well without relief. She denies any new sexual partners, vaginal discharge, or pelvic pain. No history of STD in the past.  Past Medical History  Diagnosis Date  . Asthma   . Hypertension   . Anxiety   . Pregnancy induced hypertension   . Kidney stones   . Ovarian cyst   . Vaginal Pap smear, abnormal     bx, f/u ok  . Depression     doing good, not on meds now  . Missed abortion 10/02/2013  . Headache     migraines   Past Surgical History  Procedure Laterality Date  . Cesarean section    . Dilation and curettage of uterus    . Dilation and evacuation N/A 10/04/2013    Procedure: DILATATION AND EVACUATION;  Surgeon: Sherian ReinJody Bovard-Stuckert, MD;  Location: [facility];  Service: Gynecology;  Laterality: N/A;  US in room   . Cesarean section N/A 06/06/2015    Procedure: CESAREAN SECTION;  Surgeon: Kathreen CosierBernard A Marshall, MD;  Location: [facility];  Service:  Obstetrics;  Laterality: N/A;  . Tubal ligation     Family History  Problem Relation Age of Onset  . Hearing loss Mother   . Asthma Mother   . Diabetes Son   . Cancer Maternal Grandfather     bone  . Liver disease Maternal Grandfather   . Asthma Father   . Asthma Sister   . Lupus Sister    Social History  Substance Use Topics  . Smoking status: Current Some Day Smoker -- 0.50 packs/day for 15 years    Types: Cigarettes  . Smokeless tobacco: Never Used     Comment: Smoking .5 ppd  . Alcohol Use: No     Comment: very occassionally   OB History    Gravida Para Term Preterm AB TAB SAB Ectopic Multiple Living   4 2 2  0 2 1 1  0 0 2     Review of Systems  Gastrointestinal: Positive for abdominal pain.  Genitourinary: Positive for dysuria and frequency.  All other systems reviewed and are negative.     Allergies  Norvasc  Home Medications   Prior to Admission medications   Medication Sig Start Date End Date Taking? Authorizing Provider  albuterol (PROVENTIL) (2.5 MG/3ML) 0.083% nebulizer solution Take 6 mLs (5 mg total) by nebulization every 6 (six) hours as needed for wheezing or shortness of breath. 09/12/15   Garlon HatchetLisa M Denise Washburn, PA-C  benzonatate (TESSALON) 100 MG capsule Take 1 capsule (  100 mg total) by mouth every 8 (eight) hours. Patient not taking: Reported on 10/31/2015 09/12/15   Garlon Hatchet, PA-C  clotrimazole [facility]) 10 MG troche Take 1 tablet (10 mg total) by mouth 5 (five) times daily. Patient not taking: Reported on 10/31/2015 10/18/15   Kalman Shan, MD  FLUoxetine (PROZAC) 20 MG tablet Take 3 tablets (60 mg total) by mouth daily. Take 3 tablets ONCE daily to equal 60 mg TOTAL 10/31/15   Pete Glatter, MD  fluticasone (FLONASE) 50 MCG/ACT nasal spray Place 2 sprays into both nostrils daily. 08/21/15   Pete Glatter, MD  hydrochlorothiazide (MICROZIDE) 12.5 MG capsule Take 1 capsule (12.5 mg total) by mouth daily. 10/31/15   Pete Glatter, MD  metoprolol  tartrate (LOPRESSOR) 25 MG tablet Take 1 tablet (25 mg total) by mouth 2 (two) times daily. 07/31/15   Pete Glatter, MD  montelukast (SINGULAIR) 10 MG tablet Take 1 tablet (10 mg total) by mouth at bedtime. Patient not taking: Reported on 11/13/2015 07/31/15   Pete Glatter, MD  nitrofurantoin, macrocrystal-monohydrate, (MACROBID) 100 MG capsule Take 100 mg by mouth 2 (two) times daily. 10/26/15   Historical Provider, MD  Prenat-FeCbn-FeAsp-Meth-FA-DHA (PRENATE MINI) 18-0.6-0.4-350 MG CAPS Take 1 capsule by mouth daily. Reported on 07/31/2015 07/07/15   Historical Provider, MD  Steffanie Rainwater 180 MCG/ACT inhaler Reported on 11/13/2015 06/12/15   Historical Provider, MD  QVAR 80 MCG/ACT inhaler Reported on 11/13/2015 07/07/15   Historical Provider, MD   BP 146/99 mmHg  Pulse 76  Temp(Src) 98.1 F (36.7 C) (Oral)  Resp 16  SpO2 97%  LMP 11/03/2015   Physical Exam  Constitutional: She is oriented to person, place, and time. She appears well-developed and well-nourished. No distress.  HENT:  Head: Normocephalic and atraumatic.  Mouth/Throat: Oropharynx is clear and moist.  Eyes: Conjunctivae and EOM are normal. Pupils are equal, round, and reactive to light.  Neck: Normal range of motion. Neck supple.  Cardiovascular: Normal rate, regular rhythm and normal heart sounds.   Pulmonary/Chest: Breath sounds normal. No respiratory distress. She has no wheezes.  Abdominal: Soft. Bowel sounds are normal. There is no tenderness. There is no guarding and no CVA tenderness.  Abdomen soft, nondistended, normal bowel sounds, endorses "pressure" in the suprapubic region without focal tenderness  Musculoskeletal: Normal range of motion. She exhibits no edema.  Neurological: She is alert and oriented to person, place, and time.  Skin: Skin is warm and dry. She is not diaphoretic.  Psychiatric: She has a normal mood and affect.  Nursing note and vitals reviewed.   ED Course  Procedures (including  critical care time) Labs Review Labs Reviewed  URINALYSIS, ROUTINE W REFLEX MICROSCOPIC (NOT AT [facility]) - Abnormal; Notable for the following:    APPearance TURBID (*)    Hgb urine dipstick MODERATE (*)    Protein, ur 30 (*)    Leukocytes, UA LARGE (*)    All other components within normal limits  URINE MICROSCOPIC-ADD ON - Abnormal; Notable for the following:    Squamous Epithelial / LPF 0-5 (*)    Bacteria, UA FEW (*)    All other components within normal limits  POC URINE PREG, ED    Imaging Review No results found. I have personally reviewed and evaluated these images and lab results as part of my medical decision-making.   EKG Interpretation None      MDM   Final diagnoses:  UTI (lower urinary tract infection)  Dysuria  35 year old female here with urinary symptoms intermittently for the past several months. She is afebrile and nontoxic. Her abdomen is soft and benign. She has no CVA tenderness today. Based on chart review, it appears that she was most recently on Macrobid, she denies any relief for symptoms with this. Urine pregnancy test is negative. Today. Her UA does appear infectious with large leuks and TMTC WBC's.  Will start on keflex.  Discussed plan with patient, he/she acknowledged understanding and agreed with plan of care.  Return precautions given for new or worsening symptoms.  Garlon Hatchet, PA-C 12/03/15 1457  Lorre Nick, MD 12/06/15 [phone]

## 2015-12-03 NOTE — ED Notes (Signed)
Bladder scanner resulted about 67 mL in bladder post-void.

## 2015-12-03 NOTE — ED Notes (Signed)
Discharge instructions, follow up care, and rx x1 reviewed with patient. Patient verbalized understanding. 

## 2015-12-03 NOTE — Discharge Instructions (Signed)
Take the prescribed medication as directed. °Follow-up with your primary care doctor. °Return to the ED for new or worsening symptoms. °

## 2015-12-03 NOTE — ED Notes (Signed)
Pt c/o lower abdominal pain, low back pain, and difficulty urinating x "months."  Pain score 10/10.  Pt reports taking Tylenol and AZO w/o relief.  Pt had a c section in January 2017.  Sts she has been seen by OBGYN for same and started on an antibiotic "that didn't help at all."

## 2015-12-05 LAB — URINE CULTURE

## 2015-12-05 MED FILL — ?METOPROLOL 25 MG TABLET: 25 | 30 days supply | Qty: 60 | Fill #3

## 2015-12-05 MED FILL — ?HYDROCHLOROTHIAZIDE 12.5MG: 12.5 | 30 days supply | Qty: 30 | Fill #1

## 2015-12-14 ENCOUNTER — Telehealth: Payer: Self-pay | Admitting: Internal Medicine

## 2015-12-14 NOTE — Telephone Encounter (Signed)
Pt calling, states that she reviewed her PFT results on MyChart and wants to understand them better. I advised the MR is out of town until Aug 7th and she is not wanting to wait until he returns for these results. Pt states that she is having some sore throat and weird feeling like tightness in her throat the last few days. Pt states that she wants these reviewed with her sooner than later.  Spoke with MW, states that the patient needs to be seen if symptoms are present. Pt scheduled for appt with MW at 11am 12/15/15.  Pt aware of appt date/time. Pt states that she does have a Proair rescue inhaler and I have advised her to try using this when she gets the feeling of tightness in her throat to see if it gives some relief.  Nothing further needed.

## 2015-12-15 ENCOUNTER — Ambulatory Visit: Payer: Self-pay | Admitting: Internal Medicine

## 2015-12-25 ENCOUNTER — Telehealth: Payer: Self-pay | Admitting: Internal Medicine

## 2015-12-25 NOTE — Telephone Encounter (Signed)
Called and spoke with pt and she stated that she can see on mychart on the reading of the PFt.  She is wanting the results of this test, where she can understand what is going on.  Pt is wanting these results as soon as possible.  MR please advise. thanks

## 2015-12-26 NOTE — Telephone Encounter (Signed)
Your next available appointment is 01/24/2016.  Please advise.  Thanks

## 2015-12-26 NOTE — Telephone Encounter (Signed)
She orginally saw me end may 2017 and then fu with SG andmyself mid June 2017 (feno was normal). So we did PFT 11/16/15 and gave her  Results around the same time ; it showed moderated obstruction. At that point changed her 6 mo fu to 3 mo to see her sooner. So I am confused why she is calling again about the same PFT results. I also noted that appt was given for MW to rfeivew those PFT but ? She canceled/no showed.    Given her concern about the PFT result - best I discus face to face; Kind of hard to go over phone. This might not be clear cut asthma but then she is also feeling well. So kind of confusing. And at this point APP visit might not be adequate  How soon can you get her to see me?  Thanks  Dr. Kalman ShanMurali Christyn Gutkowski, M.D., [facility].C.C.P Pulmonary and Critical Care Medicine Staff Physician [facility] Pager: [phone], If no answer or between  15:00h - 7:00h: call [phone]  12/26/2015 1:14 AM

## 2015-12-28 NOTE — Telephone Encounter (Signed)
lmtcb x1 for pt. 

## 2015-12-28 NOTE — Telephone Encounter (Signed)
See if you can bring her 01/01/16 AM wtihout add-on. If not in PM - I am opening appt special for another patient -msg sent to triage so you can know who it is. You can add this patient for 15 min slot around the same time as the other patient. She will need office spirometry  If 01/01/16 wont work then we can do 01/24/16; if she can wait  Thanks  Dr. Kalman ShanMurali Bria Sparr, M.D., [facility].C.P Pulmonary and Critical Care Medicine Staff Physician [place] [facility] [place] [facility] Pager: [phone], If no answer or between  15:00h - 7:00h: call [phone]  12/28/2015 9:09 AM

## 2015-12-29 NOTE — Telephone Encounter (Signed)
lmomtcb x1 for pt 

## 2016-01-01 NOTE — Telephone Encounter (Signed)
Called LMTCB but also advised her to call me in elink [phone] on   - thu 01/04/16 - 11pm through Friday 01/05/16; 7am - Friday 01/05/16; 9pm through Saturday 01/06/16; 7am   To discuss results. Please fu with her and advise of above. Or she can come in AM of 01/04/16 to the ofice to see me face to face  Thanks  Dr. Kalman ShanMurali Jaylia Pettus, M.D., [facility]P Pulmonary and Critical Care Medicine Staff Physician [place] [facility] [place] [facility] Pager: [phone], If no answer or between  15:00h - 7:00h: call [phone]  01/01/2016 6:16 PM

## 2016-01-01 NOTE — Telephone Encounter (Signed)
lmomtcb for pt 

## 2016-01-01 NOTE — Telephone Encounter (Signed)
Patient returned call, CB [phone].

## 2016-01-01 NOTE — Telephone Encounter (Signed)
I called spoke with pt. She reports she has a newborn and is unable to come in for an appointment at this time. She is requesting MR call her on the phone to discuss the results. Please advise thanks

## 2016-01-02 NOTE — Telephone Encounter (Signed)
lmtcb for pt.  

## 2016-01-03 NOTE — Telephone Encounter (Signed)
As stated below, pt has a newborn and is unable to come in to clinic to discuss these results.   lmtcb X2 for pt.

## 2016-01-04 NOTE — Telephone Encounter (Signed)
lmtcb x3 for pt. 

## 2016-01-08 NOTE — Telephone Encounter (Signed)
I left msg for her [name] to call me in elink but she never did. What is good times to call her? WE run busy schedule in office 9-5; one option is wed afternoon  Thanks  Dr. Kalman ShanMurali Leimomi Zervas, M.D., [facility]C.P Pulmonary and Critical Care Medicine Staff Physician [place] [facility] [place] [facility] and Critical Care Pager: [phone], If no answer or between  15:00h - 7:00h: call [phone]  01/08/2016 3:03 PM

## 2016-01-08 NOTE — Telephone Encounter (Signed)
MR pt is unable to come into the office.  Please advise. thanks

## 2016-01-08 NOTE — Telephone Encounter (Signed)
LMTCB x 1 

## 2016-01-09 NOTE — Telephone Encounter (Signed)
lmtcb for pt.  

## 2016-01-10 NOTE — Telephone Encounter (Signed)
Please do a certified letter that we have called her several times and we are unable to get her at the spiecific time  Therefore, she needs  to make an appointment

## 2016-01-10 NOTE — Telephone Encounter (Signed)
Letter printed, and placed in MR's lookat to be signed so that we may send it to be mailed certified Will route to MR and Robynn Panelise

## 2016-01-10 NOTE — Telephone Encounter (Signed)
lmtcb for pt. Unable to reach pt after several attempts to contact her.  MR please advise further. Thanks.

## 2016-01-12 NOTE — Telephone Encounter (Signed)
Letter signed by MR, [facility] [place] she has left for the day. Nothing further needed at this time. Will sign off.

## 2016-01-18 ENCOUNTER — Ambulatory Visit (INDEPENDENT_AMBULATORY_CARE_PROVIDER_SITE_OTHER): Payer: Medicaid Other | Admitting: Adult Health

## 2016-01-18 ENCOUNTER — Encounter: Payer: Self-pay | Admitting: Adult Health

## 2016-01-18 DIAGNOSIS — J4540 Moderate persistent asthma, uncomplicated: Secondary | ICD-10-CM

## 2016-01-18 MED ORDER — BUDESONIDE-FORMOTEROL FUMARATE 160-4.5 MCG/ACT IN AERO: 2.0000 | INHALATION_SPRAY | Freq: Two times a day (BID) | RESPIRATORY_TRACT | 5 refills | Status: DC

## 2016-01-18 NOTE — Progress Notes (Signed)
Subjective:    Patient ID: [name], female    DOB: September 06, 1980, 35 y.o.   MRN: [id number]  HPI 35 yo female seen for pulmonary consult 09/2015 for asthma .   TEST  CTA Chest 03/2015 , neg PE, mosaic attenuation of the lung .  FENO 09/2015 -7  Autoimmune panel neg.  Spirometry w/ DLCO 11/16/15 FEV1, ratio 55, FVC 94, DLCO 95%.   01/18/2016 Follow up : Asthma  Pt returns for 3 month follow up .Says her asthma is not as well controlled.  More wheezing , dry cough and nasal drip over last few weeks.  She continues to smoke, discussed cessation.  She has had increased SABA use.  Used her mother's symbicort and felt it helped her.  She denies chest pain, orthopnea , edema , hemoptysis , fever.  Says she is on Birth control. She is not nursing.    Past Medical History:  Diagnosis Date  . Anxiety   . Asthma   . Depression    doing good, not on meds now  . Headache    migraines  . Hypertension   . Kidney stones   . Missed abortion 10/02/2013  . Ovarian cyst   . Pregnancy induced hypertension   . Vaginal Pap smear, abnormal    bx, f/u ok   Current Outpatient Prescriptions on File Prior to Visit  Medication Sig Dispense Refill  . albuterol (PROVENTIL) (2.5 MG/3ML) 0.083% nebulizer solution Take 6 mLs (5 mg total) by nebulization every 6 (six) hours as needed for wheezing or shortness of breath. 75 mL 0  . FLUoxetine (PROZAC) 20 MG tablet Take 3 tablets (60 mg total) by mouth daily. Take 3 tablets ONCE daily to equal 60 mg TOTAL 90 tablet 3  . metoprolol tartrate (LOPRESSOR) 25 MG tablet Take 1 tablet (25 mg total) by mouth 2 (two) times daily. 180 tablet 3  . QVAR 80 MCG/ACT inhaler inhale 2 puff by mouth once daily  3  . fluticasone (FLONASE) 50 MCG/ACT nasal spray Place 2 sprays into both nostrils daily. (Patient not taking: Reported on 01/18/2016) 16 g 6  . hydrochlorothiazide (MICROZIDE) 12.5 MG capsule Take 1 capsule (12.5 mg total) by mouth daily. (Patient not taking:  Reported on 01/18/2016) 90 capsule 2  . montelukast (SINGULAIR) 10 MG tablet Take 1 tablet (10 mg total) by mouth at bedtime. (Patient not taking: Reported on 01/18/2016) 30 tablet 3  . Prenat-FeCbn-FeAsp-Meth-FA-DHA (PRENATE MINI) 18-0.6-0.4-350 MG CAPS Take 1 capsule by mouth daily. Reported on 07/31/2015  6  . promethazine (PHENERGAN) 25 MG tablet Take 25 mg by mouth every 4 (four) hours as needed. n/v  6   No current facility-administered medications on file prior to visit.       Review of Systems Constitutional:   No  weight loss, night sweats,  Fevers, chills, fatigue, or  lassitude.  HEENT:   No headaches,  Difficulty swallowing,  Tooth/dental problems, or  Sore throat,                No sneezing, itching, ear ache, nasal congestion, post nasal drip,   CV:  No chest pain,  Orthopnea, PND, swelling in lower extremities, anasarca, dizziness, palpitations, syncope.   GI  No heartburn, indigestion, abdominal pain, nausea, vomiting, diarrhea, change in bowel habits, loss of appetite, bloody stools.   Resp: .  No chest wall deformity  Skin: no rash or lesions.  GU: no dysuria, change in color of urine, no urgency or  frequency.  No flank pain, no hematuria   MS:  No joint pain or swelling.  No decreased range of motion.  No back pain.  Psych:  No change in mood or affect. No depression or anxiety.  No memory loss.         Objective:   Physical Exam Vitals:   01/18/16 1515  BP: 122/84  Pulse: 79  Temp: 98.3 F (36.8 C)  TempSrc: Oral  SpO2: 97%  Weight: 185 lb (83.9 kg)  Height: 5\' 4"  (1.626 m)   GEN: A/Ox3; pleasant , NAD, well nourished    HEENT:  [place]/AT,  EACs-clear, TMs-wnl, NOSE-clear, THROAT-clear, no lesions, no postnasal drip or exudate noted.   NECK:  Supple w/ fair ROM; no JVD; normal carotid impulses w/o bruits; no thyromegaly or nodules palpated; no lymphadenopathy.    RESP  Clear  P & A; w/o, wheezes/ rales/ or rhonchi. no accessory muscle use, no  dullness to percussion  CARD:  RRR, no m/r/g  , no peripheral edema, pulses intact, no cyanosis or clubbing.  GI:   Soft & nt; nml bowel sounds; no organomegaly or masses detected.   Musco: Warm bil, no deformities or joint swelling noted.   Neuro: alert, no focal deficits noted.    Skin: Warm, no lesions or rashes  Melissa Edberg NP-C  [place] [facility]  01/18/2016         Assessment & Plan:

## 2016-01-18 NOTE — Assessment & Plan Note (Signed)
Asthma does not appear to be optimally controlled on QVAR  Trial of Symbicort  Advised will need to change if plans for pregnancy occur.   Plan  Patient Instructions  Change QVAR to Symbicort 2 puffs Twice daily  , rinse after use.  Begin Zyrtec 10mg  At bedtime  As needed  Drainage .  Follow up Dr. Marchelle Gearingamaswamy in 6-8 weeks and As needed   Please contact office for sooner follow up if symptoms do not improve or worsen or seek emergency care

## 2016-01-18 NOTE — Addendum Note (Signed)
Addended by: Karalee HeightOX, Shelaine Frie P on: 01/18/2016 04:31 PM   Modules accepted: Orders

## 2016-01-18 NOTE — Patient Instructions (Signed)
Change QVAR to Symbicort 2 puffs Twice daily  , rinse after use.  Begin Zyrtec 10mg  At bedtime  As needed  Drainage .  Follow up Dr. Marchelle Gearingamaswamy in 6-8 weeks and As needed   Please contact office for sooner follow up if symptoms do not improve or worsen or seek emergency care

## 2016-01-27 ENCOUNTER — Encounter: Payer: Self-pay | Admitting: *Deleted

## 2016-01-27 LAB — LAB REPORT - SCANNED: Pap: NEGATIVE

## 2016-02-01 MED FILL — ?FLUOXETINE HCL 20MG TABLET: 20 | 30 days supply | Qty: 90 | Fill #1

## 2016-02-01 MED FILL — ?MONTELUKAST SOD 10 MG TAB: 10 MG | 30 days supply | Qty: 30 | Fill #2

## 2016-02-01 MED FILL — HYDROCHLOROTHIAZIDE 12.5 MG: 12.5 | 30 days supply | Qty: 30 | Fill #2

## 2016-02-01 MED FILL — ?METOPROLOL 25 MG TABLET: 25 | 30 days supply | Qty: 60 | Fill #4

## 2016-02-02 ENCOUNTER — Ambulatory Visit: Payer: Self-pay | Admitting: Internal Medicine

## 2016-02-16 ENCOUNTER — Emergency Department (HOSPITAL_COMMUNITY): Payer: Medicaid Other

## 2016-02-16 ENCOUNTER — Encounter (HOSPITAL_COMMUNITY): Payer: Self-pay | Admitting: Emergency Medicine

## 2016-02-16 ENCOUNTER — Emergency Department (HOSPITAL_COMMUNITY)
Admission: EM | Admit: 2016-02-16 | Discharge: 2016-02-16 | Disposition: A | Discharge status: Home / Self Care | Payer: Medicaid Other | Attending: Emergency Medicine | Admitting: Emergency Medicine

## 2016-02-16 DIAGNOSIS — F17210 Nicotine dependence, cigarettes, uncomplicated: Secondary | ICD-10-CM | POA: Insufficient documentation

## 2016-02-16 DIAGNOSIS — J069 Acute upper respiratory infection, unspecified: Secondary | ICD-10-CM | POA: Diagnosis not present

## 2016-02-16 DIAGNOSIS — J45909 Unspecified asthma, uncomplicated: Secondary | ICD-10-CM | POA: Insufficient documentation

## 2016-02-16 DIAGNOSIS — I10 Essential (primary) hypertension: Secondary | ICD-10-CM | POA: Diagnosis not present

## 2016-02-16 DIAGNOSIS — R69 Illness, unspecified: Secondary | ICD-10-CM

## 2016-02-16 DIAGNOSIS — J111 Influenza due to unidentified influenza virus with other respiratory manifestations: Secondary | ICD-10-CM

## 2016-02-16 MED ORDER — ONDANSETRON 4 MG PO TBDP: 4.0000 mg | ORAL_TABLET | Freq: Three times a day (TID) | ORAL | 0 refills | Status: DC | PRN

## 2016-02-16 MED ORDER — DEXTROMETHORPHAN HBR 15 MG/5ML PO SYRP: 10.0000 mL | ORAL_SOLUTION | Freq: Four times a day (QID) | ORAL | 0 refills | Status: DC | PRN

## 2016-02-16 MED ORDER — PREDNISONE 20 MG PO TABS: 40.0000 mg | ORAL_TABLET | Freq: Every day | ORAL | 0 refills | Status: DC

## 2016-02-16 NOTE — ED Notes (Signed)
Pt to xray

## 2016-02-16 NOTE — ED Provider Notes (Signed)
[facility]-EMERGENCY DEPT Provider Note   CSN: [id number] Arrival date & time: 02/16/16  1139     History   Chief Complaint Chief Complaint  Patient presents with  . URI  . Emesis    HPI [name] is a 35 y.o. female.  HPI  Patient presents with concern of ongoing cough, nausea, weakness. Symptoms began about one week ago, has been persistent. During this illness the patient has tried multiple OTC medications, including Tylenol, aspirin, ibuprofen, Robitussin, Mucinex. No fever, vomiting, diarrhea, confusion, disorientation. Patient states that she is generally well aside from history of hypertension, and smoking cigarettes, which she continues to do.  Smoking cessation provided, particularly in light of this patient's evaluation in the ED. Patient also has one sick contact with a sick child at home.   Past Medical History:  Diagnosis Date  . Anxiety   . Asthma   . Depression    doing good, not on meds now  . Headache    migraines  . Hypertension   . Kidney stones   . Missed abortion 10/02/2013  . Ovarian cyst   . Pregnancy induced hypertension   . Vaginal Pap smear, abnormal    bx, f/u ok    Patient Active Problem List   Diagnosis Date Noted  . Postpartum complication 06/13/2015  . Normal labor and delivery 06/06/2015  . Indication for care in labor or delivery 06/06/2015  . S/P cesarean section 06/06/2015  . Asthma, chronic 07/29/2014  . Depression 07/29/2014  . Smoking 07/29/2014  . Essential hypertension, benign 07/29/2014  . Missed abortion 10/02/2013    Past Surgical History:  Procedure Laterality Date  . CESAREAN SECTION    . CESAREAN SECTION N/A 06/06/2015   Procedure: CESAREAN SECTION;  Surgeon: Kathreen Cosier, MD;  Location: [facility];  Service: Obstetrics;  Laterality: N/A;  . DILATION AND CURETTAGE OF UTERUS    . DILATION AND EVACUATION N/A 10/04/2013   Procedure: DILATATION AND EVACUATION;  Surgeon: Sherian Rein, MD;  Location:  [facility];  Service: Gynecology;  Laterality: N/A;  [place] in room   . TUBAL LIGATION      OB History    Gravida Para Term Preterm AB Living   4 2 2  0 2 2   SAB TAB Ectopic Multiple Live Births   1 1 0 0 2       Home Medications    Prior to Admission medications   Medication Sig Start Date End Date Taking? Authorizing Provider  albuterol (PROVENTIL) (2.5 MG/3ML) 0.083% nebulizer solution Take 6 mLs (5 mg total) by nebulization every 6 (six) hours as needed for wheezing or shortness of breath. 09/12/15   Garlon Hatchet, PA-C  budesonide-formoterol [facility]) 160-4.5 MCG/ACT inhaler Inhale 2 puffs into the lungs 2 (two) times daily. 01/18/16   Tammy S Parrett, NP  FLUoxetine (PROZAC) 20 MG tablet Take 3 tablets (60 mg total) by mouth daily. Take 3 tablets ONCE daily to equal 60 mg TOTAL 10/31/15   Pete Glatter, MD  fluticasone (FLONASE) 50 MCG/ACT nasal spray Place 2 sprays into both nostrils daily. Patient not taking: Reported on 01/18/2016 08/21/15   Pete Glatter, MD  hydrochlorothiazide (MICROZIDE) 12.5 MG capsule Take 1 capsule (12.5 mg total) by mouth daily. Patient not taking: Reported on 01/18/2016 10/31/15   Pete Glatter, MD  metoprolol tartrate (LOPRESSOR) 25 MG tablet Take 1 tablet (25 mg total) by mouth 2 (two) times daily. 07/31/15   Pete Glatter, MD  montelukast (SINGULAIR) 10 MG tablet Take 1 tablet (10 mg total) by mouth at bedtime. Patient not taking: Reported on 01/18/2016 07/31/15   Pete Glatterawn T Langeland, MD  Prenat-FeCbn-FeAsp-Meth-FA-DHA (PRENATE MINI) 18-0.6-0.4-350 MG CAPS Take 1 capsule by mouth daily. Reported on 07/31/2015 07/07/15   Historical Provider, MD  promethazine (PHENERGAN) 25 MG tablet Take 25 mg by mouth every 4 (four) hours as needed. n/v 11/08/15   Historical Provider, MD  QVAR 80 MCG/ACT inhaler inhale 2 puff by mouth once daily 07/07/15   Historical Provider, MD    Family History Family History  Problem Relation Age of Onset  . Hearing loss Mother   .  Asthma Mother   . Asthma Father   . Diabetes Son   . Cancer Maternal Grandfather     bone  . Liver disease Maternal Grandfather   . Asthma Sister   . Lupus Sister     Social History Social History  Substance Use Topics  . Smoking status: Current Some Day Smoker    Packs/day: 0.50    Years: 15.00    Types: Cigarettes  . Smokeless tobacco: Never Used     Comment: Smoking .5 ppd  . Alcohol use No     Comment: very occassionally     Allergies   Norvasc [amlodipine besylate]   Review of Systems Review of Systems  Constitutional:       Per HPI, otherwise negative  HENT:       Per HPI, otherwise negative  Respiratory:       Per HPI, otherwise negative  Cardiovascular:       Per HPI, otherwise negative  Gastrointestinal: Negative for vomiting.  Endocrine:       Negative aside from HPI  Genitourinary:       Neg aside from HPI   Musculoskeletal:       Per HPI, otherwise negative  Skin: Negative.   Neurological: Negative for syncope.     Physical Exam Updated Vital Signs BP (!) 151/107 (BP Location: Right Arm)   Pulse 86   Temp 98 F (36.7 C) (Oral)   Resp 20   Ht 5\' 4"  (1.626 m)   Wt 184 lb (83.5 kg)   LMP 02/07/2016   SpO2 100%   BMI 31.58 kg/m   Physical Exam  Constitutional: She is oriented to person, place, and time. She appears well-developed and well-nourished. No distress.  HENT:  Head: Normocephalic and atraumatic.  Eyes: Conjunctivae and EOM are normal.  Cardiovascular: Normal rate and regular rhythm.   Pulmonary/Chest: Effort normal and breath sounds normal. No stridor. No respiratory distress.  Abdominal: She exhibits no distension.  Musculoskeletal: She exhibits no edema.  Neurological: She is alert and oriented to person, place, and time. No cranial nerve deficit.  Skin: Skin is warm and dry.  Psychiatric: She has a normal mood and affect.  Nursing note and vitals reviewed.    ED Treatments / Results   Radiology Dg Chest 2  View  Result Date: 02/16/2016 CLINICAL DATA:  Cough, nasal congestion and sore throat that started last week. Nausea and vomiting today. EXAM: CHEST  2 VIEW COMPARISON:  09/12/2015 FINDINGS: The heart size and mediastinal contours are within normal limits. Both lungs are clear. The visualized skeletal structures are unremarkable. IMPRESSION: No active cardiopulmonary disease. Electronically Signed   By: Tollie Ethavid  Kwon M.D.   On: 02/16/2016 12:49    Procedures Procedures (including critical care time)   Initial Impression / Assessment and Plan / ED Course  I have reviewed the triage vital signs and the nursing notes.  Pertinent labs & imaging results that were available during my care of the patient were reviewed by me and considered in my medical decision making (see chart for details).  Clinical Course   On repeat exam the patient remains in similar condition. I discussed all findings from the x-ray with her. Signs remained unremarkable. Discussed initiation of new medication, importance of following up with primary care to ensure resolution of her symptoms. Since female, presenting with URI like illness, does have smoking history, otherwise minimal risk profile for substantial disease. Here for bacteremia, sepsis, pneumonia. Patient is awake and alert, ambulatory, proper for further evaluation, management as an outpatient.   Gerhard Munch, MD 02/16/16 [phone]

## 2016-02-16 NOTE — ED Notes (Signed)
Pt returned from xray

## 2016-02-16 NOTE — ED Triage Notes (Signed)
Patient states cough, nasal congestion, sore throat that started last week.   Patient states been N/V today.  Patient states she has taken Mucinex and Nyquil/Dayquil with no relief.

## 2016-02-21 ENCOUNTER — Emergency Department (HOSPITAL_COMMUNITY)
Admission: EM | Admit: 2016-02-21 | Discharge: 2016-02-21 | Disposition: A | Discharge status: Home / Self Care | Payer: Medicaid Other | Attending: Emergency Medicine | Admitting: Emergency Medicine

## 2016-02-21 DIAGNOSIS — J069 Acute upper respiratory infection, unspecified: Secondary | ICD-10-CM | POA: Diagnosis not present

## 2016-02-21 DIAGNOSIS — R05 Cough: Secondary | ICD-10-CM | POA: Diagnosis present

## 2016-02-21 DIAGNOSIS — I10 Essential (primary) hypertension: Secondary | ICD-10-CM | POA: Diagnosis not present

## 2016-02-21 DIAGNOSIS — F17210 Nicotine dependence, cigarettes, uncomplicated: Secondary | ICD-10-CM | POA: Diagnosis not present

## 2016-02-21 DIAGNOSIS — J45909 Unspecified asthma, uncomplicated: Secondary | ICD-10-CM | POA: Insufficient documentation

## 2016-02-21 MED ORDER — AMOXICILLIN-POT CLAVULANATE 875-125 MG PO TABS: 1.0000 | ORAL_TABLET | Freq: Two times a day (BID) | ORAL | 0 refills | Status: DC

## 2016-02-21 MED ORDER — BENZONATATE 100 MG PO CAPS: 200.0000 mg | ORAL_CAPSULE | Freq: Two times a day (BID) | ORAL | 0 refills | Status: DC | PRN

## 2016-02-21 NOTE — ED Provider Notes (Signed)
[facility]-EMERGENCY DEPT Provider Note   CSN: [id number] Arrival date & time: 02/21/16  1908  By signing my name below, I, Placido Sou, attest that this documentation has been prepared under the direction and in the presence of Barrett Henle, [place]. Electronically Signed: Placido Sou, ED Scribe. 02/21/16. 7:40 PM.  History   Chief Complaint Chief Complaint  Patient presents with  . Cough    HPI HPI Comments: [name] is a 35 y.o. female who presents to the Emergency Department complaining of worsening, moderate, productive cough with green sputum x 2 weeks. Pt was seen on 02/16/2016 for cough, nausea and weakness, had a CXR performed which was negative and was d/c with dextromethorphan, zofran and prednisone which she confirms being compliant with. She states her cough has worsened with associated nasal congestion, sinus pressure, rhinorrhea, intermittent wheezing which is gradually alleviating and SOB with coughing bouts. She has also taken Dayquil, cough syrup and Mucinex w/o relief. She states her daughter has recently been sick with cold like symptoms. She is a smoker and currently smokes 3-4 cigarettes per day. Pt denies ear pain, sore throat, CP, vomiting and diarrhea.   The history is provided by the patient. No language interpreter was used.    Past Medical History:  Diagnosis Date  . Anxiety   . Asthma   . Depression    doing good, not on meds now  . Headache    migraines  . Hypertension   . Kidney stones   . Missed abortion 10/02/2013  . Ovarian cyst   . Pregnancy induced hypertension   . Vaginal Pap smear, abnormal    bx, f/u ok    Patient Active Problem List   Diagnosis Date Noted  . Postpartum complication 06/13/2015  . Normal labor and delivery 06/06/2015  . Indication for care in labor or delivery 06/06/2015  . S/P cesarean section 06/06/2015  . Asthma, chronic 07/29/2014  . Depression 07/29/2014  . Smoking 07/29/2014  . Essential  hypertension, benign 07/29/2014  . Missed abortion 10/02/2013    Past Surgical History:  Procedure Laterality Date  . CESAREAN SECTION    . CESAREAN SECTION N/A 06/06/2015   Procedure: CESAREAN SECTION;  Surgeon: Kathreen Cosier, MD;  Location: [facility];  Service: Obstetrics;  Laterality: N/A;  . DILATION AND CURETTAGE OF UTERUS    . DILATION AND EVACUATION N/A 10/04/2013   Procedure: DILATATION AND EVACUATION;  Surgeon: Sherian Rein, MD;  Location: [facility];  Service: Gynecology;  Laterality: N/A;  [place] in room   . TUBAL LIGATION      OB History    Gravida Para Term Preterm AB Living   4 2 2  0 2 2   SAB TAB Ectopic Multiple Live Births   1 1 0 0 2       Home Medications    Prior to Admission medications   Medication Sig Start Date End Date Taking? Authorizing Provider  albuterol (PROVENTIL) (2.5 MG/3ML) 0.083% nebulizer solution Take 6 mLs (5 mg total) by nebulization every 6 (six) hours as needed for wheezing or shortness of breath. 09/12/15   Garlon Hatchet, PA-C  amoxicillin-clavulanate (AUGMENTIN) 875-125 MG tablet Take 1 tablet by mouth every 12 (twelve) hours. 02/21/16   Barrett Henle, PA-C  benzonatate (TESSALON) 100 MG capsule Take 2 capsules (200 mg total) by mouth 2 (two) times daily as needed for cough. 02/21/16   Barrett Henle, PA-C  budesonide-formoterol [facility]) 160-4.5 MCG/ACT inhaler Inhale 2 puffs into  the lungs 2 (two) times daily. 01/18/16   Tammy S Parrett, NP  dextromethorphan 15 MG/5ML syrup Take 10 mLs (30 mg total) by mouth 4 (four) times daily as needed for cough. 02/16/16   Gerhard Munchobert Lockwood, MD  FLUoxetine (PROZAC) 20 MG tablet Take 3 tablets (60 mg total) by mouth daily. Take 3 tablets ONCE daily to equal 60 mg TOTAL 10/31/15   Pete Glatterawn T Langeland, MD  fluticasone (FLONASE) 50 MCG/ACT nasal spray Place 2 sprays into both nostrils daily. Patient not taking: Reported on 01/18/2016 08/21/15   Pete Glatterawn T Langeland, MD  hydrochlorothiazide  (MICROZIDE) 12.5 MG capsule Take 1 capsule (12.5 mg total) by mouth daily. Patient not taking: Reported on 01/18/2016 10/31/15   Pete Glatterawn T Langeland, MD  metoprolol tartrate (LOPRESSOR) 25 MG tablet Take 1 tablet (25 mg total) by mouth 2 (two) times daily. 07/31/15   Pete Glatterawn T Langeland, MD  montelukast (SINGULAIR) 10 MG tablet Take 1 tablet (10 mg total) by mouth at bedtime. Patient not taking: Reported on 01/18/2016 07/31/15   Pete Glatterawn T Langeland, MD  ondansetron (ZOFRAN ODT) 4 MG disintegrating tablet Take 1 tablet (4 mg total) by mouth every 8 (eight) hours as needed for nausea or vomiting. 02/16/16   Gerhard Munchobert Lockwood, MD  predniSONE (DELTASONE) 20 MG tablet Take 2 tablets (40 mg total) by mouth daily with breakfast. For the next four days 02/16/16   Gerhard Munchobert Lockwood, MD  Prenat-FeCbn-FeAsp-Meth-FA-DHA (PRENATE MINI) 18-0.6-0.4-350 MG CAPS Take 1 capsule by mouth daily. Reported on 07/31/2015 07/07/15   Historical Provider, MD  promethazine (PHENERGAN) 25 MG tablet Take 25 mg by mouth every 4 (four) hours as needed. n/v 11/08/15   Historical Provider, MD  QVAR 80 MCG/ACT inhaler inhale 2 puff by mouth once daily 07/07/15   Historical Provider, MD    Family History Family History  Problem Relation Age of Onset  . Hearing loss Mother   . Asthma Mother   . Asthma Father   . Diabetes Son   . Cancer Maternal Grandfather     bone  . Liver disease Maternal Grandfather   . Asthma Sister   . Lupus Sister     Social History Social History  Substance Use Topics  . Smoking status: Current Some Day Smoker    Packs/day: 0.50    Years: 15.00    Types: Cigarettes  . Smokeless tobacco: Never Used     Comment: Smoking .5 ppd  . Alcohol use No     Comment: very occassionally     Allergies   Norvasc [amlodipine besylate]   Review of Systems Review of Systems  Constitutional: Positive for chills. Negative for fever.  HENT: Positive for congestion, rhinorrhea and sinus pressure. Negative for ear pain and sore  throat.   Respiratory: Positive for cough, shortness of breath (when coughing) and wheezing.   Cardiovascular: Negative for chest pain.  Gastrointestinal: Negative for diarrhea, nausea and vomiting.   Physical Exam Updated Vital Signs BP 139/71 (BP Location: Right Arm)   Pulse 82   Temp 97.8 F (36.6 C) (Oral)   Resp 18   LMP 02/07/2016   SpO2 100%   Physical Exam  Constitutional: She is oriented to person, place, and time. She appears well-developed and well-nourished. No distress.  Intermittent coughing during exam  HENT:  Head: Normocephalic and atraumatic.  Right Ear: Tympanic membrane normal.  Left Ear: Tympanic membrane normal.  Nose: Rhinorrhea present. Right sinus exhibits no maxillary sinus tenderness and no frontal sinus tenderness. Left sinus exhibits  no maxillary sinus tenderness and no frontal sinus tenderness.  Mouth/Throat: Uvula is midline, oropharynx is clear and moist and mucous membranes are normal. No oropharyngeal exudate, posterior oropharyngeal edema, posterior oropharyngeal erythema or tonsillar abscesses. No tonsillar exudate.  Post-nasal drip present  Eyes: Conjunctivae and EOM are normal. Pupils are equal, round, and reactive to light. Right eye exhibits no discharge. Left eye exhibits no discharge. No scleral icterus.  Neck: Normal range of motion. Neck supple.  Cardiovascular: Normal rate, regular rhythm, normal heart sounds and intact distal pulses.   Pulmonary/Chest: Effort normal and breath sounds normal. No respiratory distress. She has no wheezes. She has no rales. She exhibits no tenderness.  Abdominal: Soft. There is no tenderness.  Musculoskeletal: Normal range of motion. She exhibits no edema.  Lymphadenopathy:    She has no cervical adenopathy.  Neurological: She is alert and oriented to person, place, and time.  Skin: Skin is warm and dry. She is not diaphoretic.  Nursing note and vitals reviewed.   ED Treatments / Results  Labs (all  labs ordered are listed, but only abnormal results are displayed) Labs Reviewed - No data to display  EKG  EKG Interpretation None       Radiology No results found.  Procedures Procedures  DIAGNOSTIC STUDIES: Oxygen Saturation is 100% on RA, normal by my interpretation.    COORDINATION OF CARE: 7:40 PM Discussed next steps with pt. Pt verbalized understanding and is agreeable with the plan.    Medications Ordered in ED Medications - No data to display   Initial Impression / Assessment and Plan / ED Course  I have reviewed the triage vital signs and the nursing notes.  Pertinent labs & imaging results that were available during my care of the patient were reviewed by me and considered in my medical decision making (see chart for details).  Clinical Course    Patient presents with worsening productive cough, nasal congestion, sinus pressure and intermittent wheezing for the past 2 weeks. She notes she was seen in the ED on 9/29, diagnosed with viral URI and discharged home with prednisone and cough syrup. Reports her symptoms have worsened. Denies fever. VSS. On exam lungs clear to auscultation bilaterally. Postnasal drip present Rhinorrhea present, no frontal/maxillary sinus tenderness. Remaining exam unremarkable. Suspect patient's symptoms are likely due to upper respiratory infection, however due to symptoms being present for over 2 weeks and worsening over the past week will start patient on Augmentin. Plan to discharge patient home with antitussive and symptomatically treatment. Discussed smoking cessation with patient. Advised patient to follow up with PCP. Discussed return precautions.  I personally performed the services described in this documentation, which was scribed in my presence. The recorded information has been reviewed and is accurate.  Final Clinical Impressions(s) / ED Diagnoses   Final diagnoses:  Upper respiratory tract infection, unspecified type    New  Prescriptions New Prescriptions   AMOXICILLIN-CLAVULANATE (AUGMENTIN) 875-125 MG TABLET    Take 1 tablet by mouth every 12 (twelve) hours.   BENZONATATE (TESSALON) 100 MG CAPSULE    Take 2 capsules (200 mg total) by mouth 2 (two) times daily as needed for cough.     Satira Sark [place], [place] 02/21/16 1949    Maia Plan, MD 02/21/16 [phone]

## 2016-02-21 NOTE — Discharge Instructions (Signed)
Take your medications as prescribed. Continue drinking fluids at home to remain hydrated. I recommend refraining from smoking especially while you continue to have symptoms from your upper respiratory infection. Follow-up with your family doctor the next week if your symptoms have not completely resolved. Please return to the Emergency Department if symptoms worsen or new onset of fever, coughing up blood, difficulty breathing, chest pain, vomiting, and he would keep fluids down, headache, neck stiffness.

## 2016-02-21 NOTE — ED Notes (Signed)
PT DISCHARGED. INSTRUCTIONS AND PRESCRIPTIONS GIVEN. AAOX4. PT IN NO APPARENT DISTRESS OR PAIN. THE OPPORTUNITY TO ASK QUESTIONS WAS PROVIDED. 

## 2016-02-21 NOTE — ED Triage Notes (Signed)
Pt states [facility] and was given prednisone and tussin for her cough but the cough has not subsided. Alert and oriented. Afebrile.

## 2016-02-28 ENCOUNTER — Ambulatory Visit: Payer: Self-pay | Admitting: Internal Medicine

## 2016-03-01 ENCOUNTER — Ambulatory Visit: Payer: Self-pay | Admitting: Internal Medicine

## 2016-04-08 MED FILL — ?METOPROLOL 25 MG TABLET: 25 | 30 days supply | Qty: 60 | Fill #5

## 2016-04-08 MED FILL — ?HYDROCHLOROTHIAZIDE 12.5 M: 12.5 | 30 days supply | Qty: 30 | Fill #3 | Status: TO

## 2016-04-21 ENCOUNTER — Emergency Department (HOSPITAL_COMMUNITY)
Admission: EM | Admit: 2016-04-21 | Discharge: 2016-04-21 | Disposition: A | Discharge status: Home / Self Care | Payer: Medicaid Other | Attending: Physician Assistant | Admitting: Physician Assistant

## 2016-04-21 ENCOUNTER — Encounter (HOSPITAL_COMMUNITY): Payer: Self-pay | Admitting: *Deleted

## 2016-04-21 DIAGNOSIS — K0889 Other specified disorders of teeth and supporting structures: Secondary | ICD-10-CM | POA: Diagnosis present

## 2016-04-21 DIAGNOSIS — F17210 Nicotine dependence, cigarettes, uncomplicated: Secondary | ICD-10-CM | POA: Diagnosis not present

## 2016-04-21 DIAGNOSIS — I10 Essential (primary) hypertension: Secondary | ICD-10-CM | POA: Insufficient documentation

## 2016-04-21 DIAGNOSIS — J45909 Unspecified asthma, uncomplicated: Secondary | ICD-10-CM | POA: Insufficient documentation

## 2016-04-21 DIAGNOSIS — Z79899 Other long term (current) drug therapy: Secondary | ICD-10-CM | POA: Insufficient documentation

## 2016-04-21 MED ORDER — ACETAMINOPHEN 500 MG PO TABS
1000.0000 mg | ORAL_TABLET | Freq: Once | ORAL | Status: AC
  Administered 2016-04-21: 1000 mg via ORAL
  Filled 2016-04-21: qty 2

## 2016-04-21 MED ORDER — NAPROXEN 500 MG PO TABS: 500.0000 mg | ORAL_TABLET | Freq: Two times a day (BID) | ORAL | 0 refills | Status: DC

## 2016-04-21 MED ORDER — CLINDAMYCIN HCL 150 MG PO CAPS: 450.0000 mg | ORAL_CAPSULE | Freq: Three times a day (TID) | ORAL | 0 refills | Status: AC

## 2016-04-21 NOTE — ED Triage Notes (Signed)
Pt c/o dental pain x 2 weeks.  

## 2016-04-21 NOTE — Discharge Instructions (Signed)
Medications: Naprosyn, clindamycin  Treatment: Take Naprosyn twice daily for your pain. Take clindamycin as prescribed 3 times daily for 7 days. Take probiotics with this medication to help prevent stomach upset.  Follow-up: Please follow-up with a dentist as soon as possible for further evaluation and treatment of your symptoms. Please return to emergency department if you develop any new or worsening symptoms. In addition to the attached resources, you can also call: [facility]  [employer]  [employer] [facility]  [address]  [place], [place] [address]  Phone [phone]

## 2016-04-21 NOTE — ED Notes (Signed)
PA at bedside.

## 2016-04-21 NOTE — ED Provider Notes (Signed)
[facility]-EMERGENCY DEPT Provider Note   CSN: [id number] Arrival date & time: 04/21/16  2006   By signing my name below, I, [name], attest that this documentation has been prepared under the direction and in the presence of Buel ReamAlexandra Miloh Alcocer, PA-C. Electronically Signed: [name], Scribe. 04/22/16. 2:02 AM.   History   Chief Complaint Chief Complaint  Patient presents with  . Dental Pain   The history is provided by the patient. No language interpreter was used.   HPI Comments: [name] is a 35 y.o. female who presents to the Emergency Department complaining of constant right lower dental pain onset ~2-3 days ago. Pt reports associated subjective fever, nausea and facial swelling. She further reports SOB secondary to baseline chronic asthma. The pt states that she had a root canal to the affected tooth 6 years ago, but she does not have a regular dentist currently. She notes that she has taken 800 mg of ibuprofen, tylenol, and 2-3 naproxen (which she ran out of last night) ~every 4 hours with no relief. Pt denies chest or abdomen pain and vomiting. Pt allergic to penicillin.   Past Medical History:  Diagnosis Date  . Anxiety   . Asthma   . Depression    doing good, not on meds now  . Headache    migraines  . Hypertension   . Kidney stones   . Missed abortion 10/02/2013  . Ovarian cyst   . Pregnancy induced hypertension   . Vaginal Pap smear, abnormal    bx, f/u ok    Patient Active Problem List   Diagnosis Date Noted  . Postpartum complication 06/13/2015  . Normal labor and delivery 06/06/2015  . Indication for care in labor or delivery 06/06/2015  . S/P cesarean section 06/06/2015  . Asthma, chronic 07/29/2014  . Depression 07/29/2014  . Smoking 07/29/2014  . Essential hypertension, benign 07/29/2014  . Missed abortion 10/02/2013    Past Surgical History:  Procedure Laterality Date  . CESAREAN SECTION    . CESAREAN SECTION N/A 06/06/2015   Procedure: CESAREAN SECTION;  Surgeon: Kathreen CosierBernard A Marshall, MD;  Location: [facility];  Service: Obstetrics;  Laterality: N/A;  . DILATION AND CURETTAGE OF UTERUS    . DILATION AND EVACUATION N/A 10/04/2013   Procedure: DILATATION AND EVACUATION;  Surgeon: Sherian ReinJody Bovard-Stuckert, MD;  Location: [facility];  Service: Gynecology;  Laterality: N/A;  US in room   . TUBAL LIGATION      OB History    Gravida Para Term Preterm AB Living   4 2 2  0 2 2   SAB TAB Ectopic Multiple Live Births   1 1 0 0 2       Home Medications    Prior to Admission medications   Medication Sig Start Date End Date Taking? Authorizing Provider  albuterol (PROVENTIL) (2.5 MG/3ML) 0.083% nebulizer solution Take 6 mLs (5 mg total) by nebulization every 6 (six) hours as needed for wheezing or shortness of breath. 09/12/15   Garlon HatchetLisa M Sanders, PA-C  amoxicillin-clavulanate (AUGMENTIN) 875-125 MG tablet Take 1 tablet by mouth every 12 (twelve) hours. 02/21/16   Barrett HenleNicole Elizabeth Nadeau, PA-C  benzonatate (TESSALON) 100 MG capsule Take 2 capsules (200 mg total) by mouth 2 (two) times daily as needed for cough. 02/21/16   Barrett HenleNicole Elizabeth Nadeau, PA-C  budesonide-formoterol [facility](SYMBICORT) 160-4.5 MCG/ACT inhaler Inhale 2 puffs into the lungs 2 (two) times daily. 01/18/16   Tammy S Parrett, NP  clindamycin (CLEOCIN) 150 MG capsule Take 3  capsules (450 mg total) by mouth 3 (three) times daily. 04/21/16 04/28/16  Emi Holes, PA-C  dextromethorphan 15 MG/5ML syrup Take 10 mLs (30 mg total) by mouth 4 (four) times daily as needed for cough. 02/16/16   Gerhard Munch, MD  FLUoxetine (PROZAC) 20 MG tablet Take 3 tablets (60 mg total) by mouth daily. Take 3 tablets ONCE daily to equal 60 mg TOTAL 10/31/15   Pete Glatter, MD  fluticasone (FLONASE) 50 MCG/ACT nasal spray Place 2 sprays into both nostrils daily. Patient not taking: Reported on 01/18/2016 08/21/15   Pete Glatter, MD  hydrochlorothiazide (MICROZIDE) 12.5 MG capsule Take 1 capsule (12.5  mg total) by mouth daily. Patient not taking: Reported on 01/18/2016 10/31/15   Pete Glatter, MD  metoprolol tartrate (LOPRESSOR) 25 MG tablet Take 1 tablet (25 mg total) by mouth 2 (two) times daily. 07/31/15   Pete Glatter, MD  montelukast (SINGULAIR) 10 MG tablet Take 1 tablet (10 mg total) by mouth at bedtime. Patient not taking: Reported on 01/18/2016 07/31/15   Pete Glatter, MD  naproxen (NAPROSYN) 500 MG tablet Take 1 tablet (500 mg total) by mouth 2 (two) times daily. 04/21/16   Emi Holes, PA-C  ondansetron (ZOFRAN ODT) 4 MG disintegrating tablet Take 1 tablet (4 mg total) by mouth every 8 (eight) hours as needed for nausea or vomiting. 02/16/16   Gerhard Munch, MD  predniSONE (DELTASONE) 20 MG tablet Take 2 tablets (40 mg total) by mouth daily with breakfast. For the next four days 02/16/16   Gerhard Munch, MD  Prenat-FeCbn-FeAsp-Meth-FA-DHA (PRENATE MINI) 18-0.6-0.4-350 MG CAPS Take 1 capsule by mouth daily. Reported on 07/31/2015 07/07/15   Historical Provider, MD  promethazine (PHENERGAN) 25 MG tablet Take 25 mg by mouth every 4 (four) hours as needed. n/v 11/08/15   Historical Provider, MD  QVAR 80 MCG/ACT inhaler inhale 2 puff by mouth once daily 07/07/15   Historical Provider, MD    Family History Family History  Problem Relation Age of Onset  . Hearing loss Mother   . Asthma Mother   . Asthma Father   . Diabetes Son   . Cancer Maternal Grandfather     bone  . Liver disease Maternal Grandfather   . Asthma Sister   . Lupus Sister     Social History Social History  Substance Use Topics  . Smoking status: Current Some Day Smoker    Packs/day: 0.50    Years: 15.00    Types: Cigarettes  . Smokeless tobacco: Never Used     Comment: Smoking .5 ppd  . Alcohol use No     Comment: very occassionally     Allergies   Penicillins and Norvasc [amlodipine besylate]   Review of Systems Review of Systems  Constitutional: Positive for fever (subjective).  HENT:  Positive for dental problem and facial swelling. Negative for sore throat.   Respiratory: Negative for shortness of breath.   Cardiovascular: Negative for chest pain.  Gastrointestinal: Positive for nausea. Negative for abdominal pain and vomiting.  Genitourinary: Negative for dysuria.  Musculoskeletal: Negative for back pain.  Skin: Negative for rash and wound.  Neurological: Negative for headaches.  Psychiatric/Behavioral: The patient is not nervous/anxious.      Physical Exam Updated Vital Signs BP 135/96 (BP Location: Right Arm)   Pulse 90   Temp 98.8 F (37.1 C) (Oral)   Resp 16   Ht 5\' 4"  (1.626 m)   Wt 89.9 kg   LMP  04/14/2016   SpO2 97%   BMI 34.02 kg/m   Physical Exam  Constitutional: She appears well-developed and well-nourished. No distress.  HENT:  Head: Normocephalic and atraumatic.  Mouth/Throat: Oropharynx is clear and moist. No dental abscesses. No oropharyngeal exudate, posterior oropharyngeal edema, posterior oropharyngeal erythema or tonsillar abscesses.    Eyes: Conjunctivae are normal. Pupils are equal, round, and reactive to light. Right eye exhibits no discharge. Left eye exhibits no discharge. No scleral icterus.  Neck: Normal range of motion. Neck supple. No thyromegaly present.  Cardiovascular: Normal rate, regular rhythm, normal heart sounds and intact distal pulses.  Exam reveals no gallop and no friction rub.   No murmur heard. Pulmonary/Chest: Effort normal and breath sounds normal. No stridor. No respiratory distress. She has no wheezes. She has no rales.  Abdominal: Soft. Bowel sounds are normal. She exhibits no distension. There is no tenderness. There is no rebound and no guarding.  Musculoskeletal: She exhibits no edema.  Lymphadenopathy:    She has no cervical adenopathy.  Neurological: She is alert. Coordination normal.  Skin: Skin is warm and dry. No rash noted. She is not diaphoretic. No pallor.  Psychiatric: She has a normal mood and  affect.  Nursing note and vitals reviewed.    ED Treatments / Results  DIAGNOSTIC STUDIES: Oxygen Saturation is 99% on RA, normal by my interpretation.    COORDINATION OF CARE: 2:02 AM Will order pain medications. Discussed treatment plan with pt at bedside and pt agreed to plan.  Labs (all labs ordered are listed, but only abnormal results are displayed) Labs Reviewed - No data to display  EKG  EKG Interpretation None       Radiology No results found.  Procedures Procedures (including critical care time)  Medications Ordered in ED Medications  acetaminophen (TYLENOL) tablet 1,000 mg (1,000 mg Oral Given 04/21/16 2107)     Initial Impression / Assessment and Plan / ED Course  I have reviewed the triage vital signs and the nursing notes.  Pertinent labs & imaging results that were available during my care of the patient were reviewed by me and considered in my medical decision making (see chart for details).  Clinical Course     Patient with dentalgia.  No abscess requiring immediate incision and drainage.  Exam not concerning for Ludwig's angina or pharyngeal abscess.  Will treat with Clindamycin and Naprosyn. Pt instructed to follow-up with dentist. Patient given community dental resources. Discussed return precautions. Pt safe for discharge.   Final Clinical Impressions(s) / ED Diagnoses   Final diagnoses:  Pain, dental    New Prescriptions Discharge Medication List as of 04/21/2016  8:52 PM    START taking these medications   Details  clindamycin (CLEOCIN) 150 MG capsule Take 3 capsules (450 mg total) by mouth 3 (three) times daily., Starting Sun 04/21/2016, Until Sun 04/28/2016, Print    naproxen (NAPROSYN) 500 MG tablet Take 1 tablet (500 mg total) by mouth 2 (two) times daily., Starting Sun 04/21/2016, Print      I personally performed the services described in this documentation, which was scribed in my presence. The recorded information has been  reviewed and is accurate.    Emi Holeslexandra M Jenson Beedle, PA-C 04/22/16 0203    Courteney Randall AnLyn Mackuen, MD 04/22/16 [id number]

## 2016-04-21 NOTE — ED Notes (Signed)
Pt request something for pain. °

## 2016-04-30 ENCOUNTER — Ambulatory Visit: Payer: Medicaid Other | Attending: Family Medicine | Admitting: Family Medicine

## 2016-04-30 VITALS — BP 122/68 | HR 89 | Temp 98.4°F | Resp 20 | Wt 193.0 lb

## 2016-04-30 DIAGNOSIS — I10 Essential (primary) hypertension: Secondary | ICD-10-CM | POA: Diagnosis not present

## 2016-04-30 DIAGNOSIS — J029 Acute pharyngitis, unspecified: Secondary | ICD-10-CM | POA: Insufficient documentation

## 2016-04-30 DIAGNOSIS — J069 Acute upper respiratory infection, unspecified: Secondary | ICD-10-CM | POA: Insufficient documentation

## 2016-04-30 DIAGNOSIS — J45909 Unspecified asthma, uncomplicated: Secondary | ICD-10-CM | POA: Diagnosis not present

## 2016-04-30 DIAGNOSIS — R05 Cough: Secondary | ICD-10-CM | POA: Diagnosis present

## 2016-04-30 LAB — POCT RAPID STREP A (OFFICE): Rapid Strep A Screen: NEGATIVE

## 2016-04-30 MED ORDER — METOPROLOL TARTRATE 25 MG PO TABS: 25.0000 mg | ORAL_TABLET | Freq: Two times a day (BID) | ORAL | 3 refills | Status: DC

## 2016-04-30 MED ORDER — OSELTAMIVIR PHOSPHATE 75 MG PO CAPS
75.0000 mg | ORAL_CAPSULE | Freq: Every day | ORAL | 0 refills | Status: DC
Start: 2016-04-30 — End: 2016-08-23

## 2016-04-30 MED ORDER — ALBUTEROL SULFATE (2.5 MG/3ML) 0.083% IN NEBU: 2.5000 mg | INHALATION_SOLUTION | Freq: Four times a day (QID) | RESPIRATORY_TRACT | 2 refills | Status: DC | PRN

## 2016-04-30 MED ORDER — BUDESONIDE-FORMOTEROL FUMARATE 160-4.5 MCG/ACT IN AERO: 2.0000 | INHALATION_SPRAY | Freq: Two times a day (BID) | RESPIRATORY_TRACT | 2 refills | Status: DC

## 2016-04-30 MED ORDER — ACETAMINOPHEN 325 MG PO TABS: 325.0000 mg | ORAL_TABLET | Freq: Four times a day (QID) | ORAL | 0 refills | Status: DC | PRN

## 2016-04-30 MED FILL — OSELTAMIVIR PHOS 75 MG CAP: 75 | 10 days supply | Qty: 10 | Fill #0

## 2016-04-30 MED FILL — METOPROLOL TARTRATE 25 MG T: 25 | 30 days supply | Qty: 60 | Fill #0 | Status: TO

## 2016-04-30 MED FILL — SYMBICORT 160-4.5 MCG INH: 160-4.5 | 30 days supply | Qty: 10 | Fill #0 | Status: TO

## 2016-04-30 NOTE — Patient Instructions (Signed)
Asthma, Adult Asthma is a condition of the lungs in which the airways tighten and narrow. Asthma can make it hard to breathe. Asthma cannot be cured, but medicine and lifestyle changes can help control it. Asthma may be started (triggered) by:  Animal skin flakes (dander).  Dust.  Cockroaches.  Pollen.  Mold.  Smoke.  Cleaning products.  Hair sprays or aerosol sprays.  Paint fumes or strong smells.  Cold air, weather changes, and winds.  Crying or laughing hard.  Stress.  Certain medicines or drugs.  Foods, such as dried fruit, potato chips, and sparkling grape juice.  Infections or conditions (colds, flu).  Exercise.  Certain medical conditions or diseases.  Exercise or tiring activities. Follow these instructions at home:  Take medicine as told by your doctor.  Use a peak flow meter as told by your doctor. A peak flow meter is a tool that measures how well the lungs are working.  Record and keep track of the peak flow meter's readings.  Understand and use the asthma action plan. An asthma action plan is a written plan for taking care of your asthma and treating your attacks.  To help prevent asthma attacks:  Do not smoke. Stay away from secondhand smoke.  Change your heating and air conditioning filter often.  Limit your use of fireplaces and wood stoves.  Get rid of pests (such as roaches and mice) and their droppings.  Throw away plants if you see mold on them.  Clean your floors. Dust regularly. Use cleaning products that do not smell.  Have someone vacuum when you are not home. Use a vacuum cleaner with a HEPA filter if possible.  Replace carpet with wood, tile, or vinyl flooring. Carpet can trap animal skin flakes and dust.  Use allergy-proof pillows, mattress covers, and box spring covers.  Wash bed sheets and blankets every week in hot water and dry them in a dryer.  Use blankets that are made of polyester or cotton.  Clean bathrooms  and kitchens with bleach. If possible, have someone repaint the walls in these rooms with mold-resistant paint. Keep out of the rooms that are being cleaned and painted.  Wash hands often. Contact a doctor if:  You have make a whistling sound when breaking (wheeze), have shortness of breath, or have a cough even if taking medicine to prevent attacks.  The colored mucus you cough up (sputum) is thicker than usual.  The colored mucus you cough up changes from clear or white to yellow, green, gray, or bloody.  You have problems from the medicine you are taking such as:  A rash.  Itching.  Swelling.  Trouble breathing.  You need reliever medicines more than 2-3 times a week.  Your peak flow measurement is still at 50-79% of your personal best after following the action plan for 1 hour.  You have a fever. Get help right away if:  You seem to be worse and are not responding to medicine during an asthma attack.  You are short of breath even at rest.  You get short of breath when doing very little activity.  You have trouble eating, drinking, or talking.  You have chest pain.  You have a fast heartbeat.  Your lips or fingernails start to turn blue.  You are light-headed, dizzy, or faint.  Your peak flow is less than 50% of your personal best. This information is not intended to replace advice given to you by your health care provider. Make sure  you discuss any questions you have with your health care provider. Document Released: 10/23/2007 Document Revised: 10/12/2015 Document Reviewed: 12/03/2012 Elsevier Interactive Patient Education  2017 [employer] Influenza, Adult Influenza ("the flu") is an infection in the lungs, nose, and throat (respiratory tract). It is caused by a virus. The flu causes many common cold symptoms, as well as a high fever and body aches. It can make you feel very sick. The flu spreads easily from person to person (is contagious). Getting a flu  shot (influenza vaccination) every year is the best way to prevent the flu. Follow these instructions at home:  Take over-the-counter and prescription medicines only as told by your doctor.  Use a cool mist humidifier to add moisture (humidity) to the air in your home. This can make it easier to breathe.  Rest as needed.  Drink enough fluid to keep your pee (urine) clear or pale yellow.  Cover your mouth and nose when you cough or sneeze.  Wash your hands with soap and water often, especially after you cough or sneeze. If you cannot use soap and water, use hand sanitizer.  Stay home from work or school as told by your doctor. Unless you are visiting your doctor, try to avoid leaving home until your fever has been gone for 24 hours without the use of medicine.  Keep all follow-up visits as told by your doctor. This is important. How is this prevented?  Getting a yearly (annual) flu shot is the best way to avoid getting the flu. You may get the flu shot in late summer, fall, or winter. Ask your doctor when you should get your flu shot.  Wash your hands often or use hand sanitizer often.  Avoid contact with people who are sick during cold and flu season.  Eat healthy foods.  Drink plenty of fluids.  Get enough sleep.  Exercise regularly. Contact a doctor if:  You get new symptoms.  You have:  Chest pain.  Watery poop (diarrhea).  A fever.  Your cough gets worse.  You start to have more mucus.  You feel sick to your stomach (nauseous).  You throw up (vomit). Get help right away if:  You start to be short of breath or have trouble breathing.  Your skin or nails turn a bluish color.  You have very bad pain or stiffness in your neck.  You get a sudden headache.  You get sudden pain in your face or ear.  You cannot stop throwing up. This information is not intended to replace advice given to you by your health care provider. Make sure you discuss any questions  you have with your health care provider. Document Released: 02/13/2008 Document Revised: 10/12/2015 Document Reviewed: 02/28/2015 Elsevier Interactive Patient Education  2017 [employer]. Upper Respiratory Infection, Adult Most upper respiratory infections (URIs) are caused by a virus. A URI affects the nose, throat, and upper air passages. The most common type of URI is often called "the common cold." Follow these instructions at home:  Take medicines only as told by your doctor.  Gargle warm saltwater or take cough drops to comfort your throat as told by your doctor.  Use a warm mist humidifier or inhale steam from a shower to increase air moisture. This may make it easier to breathe.  Drink enough fluid to keep your pee (urine) clear or pale yellow.  Eat soups and other clear broths.  Have a healthy diet.  Rest as needed.  Go back to  work when your fever is gone or your doctor says it is okay.  You may need to stay home longer to avoid giving your URI to others.  You can also wear a face mask and wash your hands often to prevent spread of the virus.  Use your inhaler more if you have asthma.  Do not use any tobacco products, including cigarettes, chewing tobacco, or electronic cigarettes. If you need help quitting, ask your doctor. Contact a doctor if:  You are getting worse, not better.  Your symptoms are not helped by medicine.  You have chills.  You are getting more short of breath.  You have brown or red mucus.  You have yellow or brown discharge from your nose.  You have pain in your face, especially when you bend forward.  You have a fever.  You have puffy (swollen) neck glands.  You have pain while swallowing.  You have white areas in the back of your throat. Get help right away if:  You have very bad or constant:  Headache.  Ear pain.  Pain in your forehead, behind your eyes, and over your cheekbones (sinus pain).  Chest pain.  You have  long-lasting (chronic) lung disease and any of the following:  Wheezing.  Long-lasting cough.  Coughing up blood.  A change in your usual mucus.  You have a stiff neck.  You have changes in your:  Vision.  Hearing.  Thinking.  Mood. This information is not intended to replace advice given to you by your health care provider. Make sure you discuss any questions you have with your health care provider. Document Released: 10/23/2007 Document Revised: 01/07/2016 Document Reviewed: 08/11/2013 Elsevier Interactive Patient Education  2017 [employer]

## 2016-04-30 NOTE — Progress Notes (Signed)
C/o productive cough, green phlegm for 2 weeks, sneezing and runny nose for 1 month. Takes Q-Var, Pro-Air, and Symbicort. Has tried OTC Mucinex.

## 2016-04-30 NOTE — Progress Notes (Signed)
Subjective:  Patient ID: Melissa DanielEllenjane F Current, female    DOB: Oct 18, 1980  Age: 35 y.o. MRN: [id number]  CC: URI   HPI Melissa Danielllenjane F Ibarra presents for c/o cough, sore throat, runny nose. She reports these symptoms started 2 weeks ago and have persisted. She also reports chills and body aches which started today. She denies taking her temperature but reports feeling cold. She denies receiving any immunization this year for flu.    Outpatient Medications Prior to Visit  Medication Sig Dispense Refill  . albuterol (PROVENTIL) (2.5 MG/3ML) 0.083% nebulizer solution Take 6 mLs (5 mg total) by nebulization every 6 (six) hours as needed for wheezing or shortness of breath. 75 mL 0  . amoxicillin-clavulanate (AUGMENTIN) 875-125 MG tablet Take 1 tablet by mouth every 12 (twelve) hours. 14 tablet 0  . benzonatate (TESSALON) 100 MG capsule Take 2 capsules (200 mg total) by mouth 2 (two) times daily as needed for cough. 20 capsule 0  . budesonide-formoterol (SYMBICORT) 160-4.5 MCG/ACT inhaler Inhale 2 puffs into the lungs 2 (two) times daily. 1 Inhaler 5  . dextromethorphan 15 MG/5ML syrup Take 10 mLs (30 mg total) by mouth 4 (four) times daily as needed for cough. 120 mL 0  . FLUoxetine (PROZAC) 20 MG tablet Take 3 tablets (60 mg total) by mouth daily. Take 3 tablets ONCE daily to equal 60 mg TOTAL 90 tablet 3  . fluticasone (FLONASE) 50 MCG/ACT nasal spray Place 2 sprays into both nostrils daily. (Patient not taking: Reported on 01/18/2016) 16 g 6  . hydrochlorothiazide (MICROZIDE) 12.5 MG capsule Take 1 capsule (12.5 mg total) by mouth daily. (Patient not taking: Reported on 01/18/2016) 90 capsule 2  . montelukast (SINGULAIR) 10 MG tablet Take 1 tablet (10 mg total) by mouth at bedtime. (Patient not taking: Reported on 01/18/2016) 30 tablet 3  . naproxen (NAPROSYN) 500 MG tablet Take 1 tablet (500 mg total) by mouth 2 (two) times daily. 14 tablet 0  . ondansetron (ZOFRAN ODT) 4 MG disintegrating tablet Take  1 tablet (4 mg total) by mouth every 8 (eight) hours as needed for nausea or vomiting. 20 tablet 0  . predniSONE (DELTASONE) 20 MG tablet Take 2 tablets (40 mg total) by mouth daily with breakfast. For the next four days 8 tablet 0  . Prenat-FeCbn-FeAsp-Meth-FA-DHA (PRENATE MINI) 18-0.6-0.4-350 MG CAPS Take 1 capsule by mouth daily. Reported on 07/31/2015  6  . promethazine (PHENERGAN) 25 MG tablet Take 25 mg by mouth every 4 (four) hours as needed. n/v  6  . QVAR 80 MCG/ACT inhaler inhale 2 puff by mouth once daily  3  . metoprolol tartrate (LOPRESSOR) 25 MG tablet Take 1 tablet (25 mg total) by mouth 2 (two) times daily. 180 tablet 3   No facility-administered medications prior to visit.    ROS Review of Systems  Constitutional: Positive for chills and fatigue.  HENT: Positive for rhinorrhea, sneezing, sore throat and voice change (hoarseness). Negative for trouble swallowing.   Respiratory: Positive for cough. Negative for wheezing.   Cardiovascular: Negative.   Gastrointestinal: Negative.    Objective:  BP 122/68 (BP Location: Left Arm, Patient Position: Sitting, Cuff Size: Normal)   Pulse 89   Temp 98.4 F (36.9 C) (Oral)   Resp 20   Wt 193 lb (87.5 kg)   LMP 04/14/2016   BMI 33.13 kg/m   BP/Weight 04/30/2016 04/21/2016 02/21/2016  Systolic BP 122 135 139  Diastolic BP 68 96 71  Wt. (Lbs) 193 198.2 -  BMI 33.13 34.02 -   Physical Exam  Constitutional: She is oriented to person, place, and time. She appears well-developed and well-nourished. She appears ill.  HENT:  Right Ear: Hearing and tympanic membrane normal. There is tenderness. No drainage.  Left Ear: Hearing and tympanic membrane normal. There is tenderness. No drainage.  Nose: Rhinorrhea (white nasal discharge present) present.  Mouth/Throat: Uvula is midline and mucous membranes are normal. Posterior oropharyngeal erythema present.  Eyes: Conjunctivae and EOM are normal. Right eye exhibits no discharge. Left eye  exhibits no discharge.  Neck: Normal range of motion. Neck supple.  Cardiovascular: Normal rate, regular rhythm, normal heart sounds and intact distal pulses.   Pulmonary/Chest: Effort normal and breath sounds normal.  Abdominal: Soft. Bowel sounds are normal. She exhibits no mass. There is no tenderness.  Lymphadenopathy:    She has cervical adenopathy.  Neurological: She is alert and oriented to person, place, and time.  Skin: Skin is warm and dry.  Psychiatric: She has a normal mood and affect. Her behavior is normal. Thought content normal.   Assessment & Plan:   1. Essential hypertension, benign - metoprolol tartrate (LOPRESSOR) 25 MG tablet; Take 1 tablet (25 mg total) by mouth 2 (two) times daily.  Dispense: 180 tablet; Refill: 3  2. Chronic asthma without complication, unspecified asthma severity, unspecified whether persistent - albuterol (PROVENTIL) (2.5 MG/3ML) 0.083% nebulizer solution; Take 3 mLs (2.5 mg total) by nebulization every 6 (six) hours as needed for wheezing or shortness of breath.  Dispense: 150 mL; Refill: 2 - budesonide-formoterol (SYMBICORT) 160-4.5 MCG/ACT inhaler; Inhale 2 puffs into the lungs 2 (two) times daily.  Dispense: 1 Inhaler; Refill: 2  3. Upper respiratory tract infection, unspecified type - Respiratory virus panel -oseltamivir (TAMIFLU) 75 MG capsule Take 1 capsule (75 mg total) by mouth daily.;Dispense: 10 capsule; Refill:  0   4. Acute pharyngitis, unspecified etiology - Rapid Strep A   Meds ordered this encounter  Medications  . albuterol (PROVENTIL) (2.5 MG/3ML) 0.083% nebulizer solution    Sig: Take 3 mLs (2.5 mg total) by nebulization every 6 (six) hours as needed for wheezing or shortness of breath.    Dispense:  150 mL    Refill:  2    Order Specific Question:   Supervising Provider    Answer:   Quentin AngstJEGEDE, OLUGBEMIGA E [id number]]  . budesonide-formoterol (SYMBICORT) 160-4.5 MCG/ACT inhaler    Sig: Inhale 2 puffs into the lungs 2 (two)  times daily.    Dispense:  1 Inhaler    Refill:  2    Order Specific Question:   Supervising Provider    Answer:   Quentin AngstJEGEDE, OLUGBEMIGA E [id number]  . metoprolol tartrate (LOPRESSOR) 25 MG tablet    Sig: Take 1 tablet (25 mg total) by mouth 2 (two) times daily.    Dispense:  180 tablet    Refill:  3    Order Specific Question:   Supervising Provider    Answer:   Quentin AngstJEGEDE, OLUGBEMIGA E [id number]  . oseltamivir (TAMIFLU) 75 MG capsule    Sig: Take 1 capsule (75 mg total) by mouth daily.    Dispense:  10 capsule    Refill:  0    Order Specific Question:   Supervising Provider    Answer:   Quentin AngstJEGEDE, OLUGBEMIGA E [id number]]  . acetaminophen (TYLENOL) 325 MG tablet    Sig: Take 1 tablet (325 mg total) by mouth every 6 (six) hours as needed for mild pain, moderate pain or fever.  Dispense:  60 tablet    Refill:  0    Order Specific Question:   Supervising Provider    Answer:   Quentin Angst [id number]    Follow-up: Return if symptoms worsen or fail to improve.   Lizbeth Bark FNP

## 2016-05-01 LAB — RESPIRATORY VIRUS PANEL
Adenovirus B: NOT DETECTED
INFLUENZA A H1: NOT DETECTED
INFLUENZA A H3: NOT DETECTED
INFLUENZA A: NOT DETECTED
INFLUENZA B 1: NOT DETECTED
Metapneumovirus: NOT DETECTED
PARAINFLUENZA 2 A: NOT DETECTED
PARAINFLUENZA 3 A: NOT DETECTED
Parainfluenza 1: NOT DETECTED
RESPIRATORY SYNCYTIAL VIRUS B: DETECTED — AB
Respiratory Syncytial Virus A: NOT DETECTED
Rhinovirus: DETECTED — AB

## 2016-05-07 ENCOUNTER — Telehealth: Payer: Self-pay | Admitting: *Deleted

## 2016-05-07 NOTE — Telephone Encounter (Signed)
MA unable to leave a voice message due to system being full. !!!Please inform patient of Flu being negative and patient being positive for Rhinovirus which is viral and can not be treated with medication. Patient instructed to increase fluid intake and use a humidifier as needed. Take current medications as instructed. Patient should return to the ED if breathing difficulty increases or fever greater than 101!!!

## 2016-05-07 NOTE — Telephone Encounter (Signed)
-----   Message from Lizbeth BarkMandesia R Hairston, FNP sent at 05/07/2016  8:43 AM EST ----- -Respiratory virus panel was negative for flu. But was positive for rhinovirus & RSV B. This indicates you have a viral upper respiratory infection. -Antibiotics are not effective against viruses.  -Increase your fluid intake and rest. Use a humidifier if needed. Continue to take your medications.  -Follow up if symptoms do not improve.  -If you develop any difficulty breathing, severe coughing, chest pain, or high fever (101 F or greater) go the emergency department.

## 2016-07-07 ENCOUNTER — Encounter (HOSPITAL_COMMUNITY): Payer: Self-pay | Admitting: *Deleted

## 2016-07-07 ENCOUNTER — Emergency Department (HOSPITAL_COMMUNITY)
Admission: EM | Admit: 2016-07-07 | Discharge: 2016-07-07 | Disposition: A | Discharge status: Home / Self Care | Payer: Medicaid Other | Attending: Emergency Medicine | Admitting: Emergency Medicine

## 2016-07-07 ENCOUNTER — Emergency Department (HOSPITAL_COMMUNITY): Payer: Medicaid Other

## 2016-07-07 DIAGNOSIS — R1110 Vomiting, unspecified: Secondary | ICD-10-CM | POA: Diagnosis present

## 2016-07-07 DIAGNOSIS — I10 Essential (primary) hypertension: Secondary | ICD-10-CM | POA: Diagnosis not present

## 2016-07-07 DIAGNOSIS — F17210 Nicotine dependence, cigarettes, uncomplicated: Secondary | ICD-10-CM | POA: Diagnosis not present

## 2016-07-07 DIAGNOSIS — R112 Nausea with vomiting, unspecified: Secondary | ICD-10-CM

## 2016-07-07 DIAGNOSIS — Z79899 Other long term (current) drug therapy: Secondary | ICD-10-CM | POA: Insufficient documentation

## 2016-07-07 DIAGNOSIS — K8020 Calculus of gallbladder without cholecystitis without obstruction: Secondary | ICD-10-CM

## 2016-07-07 DIAGNOSIS — R1011 Right upper quadrant pain: Secondary | ICD-10-CM | POA: Insufficient documentation

## 2016-07-07 DIAGNOSIS — J45909 Unspecified asthma, uncomplicated: Secondary | ICD-10-CM | POA: Insufficient documentation

## 2016-07-07 DIAGNOSIS — R197 Diarrhea, unspecified: Secondary | ICD-10-CM

## 2016-07-07 LAB — COMPREHENSIVE METABOLIC PANEL
ALK PHOS: 85 U/L (ref 38–126)
ALT: 17 U/L (ref 14–54)
AST: 25 U/L (ref 15–41)
Albumin: 4.2 g/dL (ref 3.5–5.0)
Anion gap: 11 (ref 5–15)
BUN: 11 mg/dL (ref 6–20)
CALCIUM: 8.8 mg/dL — AB (ref 8.9–10.3)
CHLORIDE: 103 mmol/L (ref 101–111)
CO2: 22 mmol/L (ref 22–32)
CREATININE: 1.07 mg/dL — AB (ref 0.44–1.00)
Glucose, Bld: 94 mg/dL (ref 65–99)
Potassium: 4.5 mmol/L (ref 3.5–5.1)
Sodium: 136 mmol/L (ref 135–145)
Total Bilirubin: 1 mg/dL (ref 0.3–1.2)
Total Protein: 7 g/dL (ref 6.5–8.1)

## 2016-07-07 LAB — URINALYSIS, ROUTINE W REFLEX MICROSCOPIC
BILIRUBIN URINE: NEGATIVE
Glucose, UA: NEGATIVE mg/dL
Ketones, ur: NEGATIVE mg/dL
LEUKOCYTES UA: NEGATIVE
Nitrite: NEGATIVE
PH: 5 (ref 5.0–8.0)
Protein, ur: 30 mg/dL — AB
SPECIFIC GRAVITY, URINE: 1.02 (ref 1.005–1.030)

## 2016-07-07 LAB — CBC
HCT: 46.3 % — ABNORMAL HIGH (ref 36.0–46.0)
Hemoglobin: 15.2 g/dL — ABNORMAL HIGH (ref 12.0–15.0)
MCH: 30.3 pg (ref 26.0–34.0)
MCHC: 32.8 g/dL (ref 30.0–36.0)
MCV: 92.2 fL (ref 78.0–100.0)
PLATELETS: 287 10*3/uL (ref 150–400)
RBC: 5.02 MIL/uL (ref 3.87–5.11)
RDW: 13 % (ref 11.5–15.5)
WBC: 11.5 10*3/uL — AB (ref 4.0–10.5)

## 2016-07-07 LAB — LIPASE, BLOOD: LIPASE: 23 U/L (ref 11–51)

## 2016-07-07 LAB — I-STAT BETA HCG BLOOD, ED (MC, WL, AP ONLY): I-stat hCG, quantitative: <5 m[IU]/mL (ref <5)

## 2016-07-07 MED ORDER — ONDANSETRON 4 MG PO TBDP
4.0000 mg | ORAL_TABLET | Freq: Once | ORAL | Status: AC
  Administered 2016-07-07: 4 mg via ORAL
  Filled 2016-07-07: qty 1

## 2016-07-07 MED ORDER — ONDANSETRON 4 MG PO TBDP: 4.0000 mg | ORAL_TABLET | Freq: Three times a day (TID) | ORAL | 0 refills | Status: DC | PRN

## 2016-07-07 NOTE — ED Notes (Signed)
Pt transported to [facility]

## 2016-07-07 NOTE — ED Notes (Addendum)
Pt states she feels bruised on her right side Pt has had N/V/D

## 2016-07-07 NOTE — ED Notes (Signed)
PT also has a child in PEDS ED as well. * NOTE *

## 2016-07-07 NOTE — ED Provider Notes (Signed)
[facility]-EMERGENCY DEPT Provider Note   CSN: [id number] Arrival date & time: 07/07/16  1331     History   Chief Complaint Chief Complaint  Patient presents with  . Abdominal Pain  . Emesis    HPI [name] is a 36 y.o. female.  The history is provided by the patient.  Emesis   This is a new problem. The current episode started 6 to 12 hours ago. The problem occurs 5 to 10 times per day. The problem has not changed since onset.The emesis has an appearance of stomach contents. There has been no fever. Associated symptoms include abdominal pain and diarrhea. Pertinent negatives include no arthralgias, no chills, no cough and no fever. Risk factors include ill contacts.    Past Medical History:  Diagnosis Date  . Anxiety   . Asthma   . Depression    doing good, not on meds now  . Headache    migraines  . Hypertension   . Kidney stones   . Missed abortion 10/02/2013  . Ovarian cyst   . Pregnancy induced hypertension   . Vaginal Pap smear, abnormal    bx, f/u ok    Patient Active Problem List   Diagnosis Date Noted  . Postpartum complication 06/13/2015  . Normal labor and delivery 06/06/2015  . Indication for care in labor or delivery 06/06/2015  . S/P cesarean section 06/06/2015  . Asthma, chronic 07/29/2014  . Depression 07/29/2014  . Smoking 07/29/2014  . Essential hypertension, benign 07/29/2014  . Missed abortion 10/02/2013    Past Surgical History:  Procedure Laterality Date  . CESAREAN SECTION    . CESAREAN SECTION N/A 06/06/2015   Procedure: CESAREAN SECTION;  Surgeon: Kathreen Cosier, MD;  Location: [facility];  Service: Obstetrics;  Laterality: N/A;  . DILATION AND CURETTAGE OF UTERUS    . DILATION AND EVACUATION N/A 10/04/2013   Procedure: DILATATION AND EVACUATION;  Surgeon: Sherian Rein, MD;  Location: [facility];  Service: Gynecology;  Laterality: N/A;  [place] in room   . TUBAL LIGATION      OB History    Gravida Para Term Preterm AB Living     4 2 2  0 2 2   SAB TAB Ectopic Multiple Live Births   1 1 0 0 2       Home Medications    Prior to Admission medications   Medication Sig Start Date End Date Taking? Authorizing Provider  acetaminophen (TYLENOL) 325 MG tablet Take 1 tablet (325 mg total) by mouth every 6 (six) hours as needed for mild pain, moderate pain or fever. 04/30/16   Lizbeth Bark, FNP  albuterol (PROVENTIL) (2.5 MG/3ML) 0.083% nebulizer solution Take 6 mLs (5 mg total) by nebulization every 6 (six) hours as needed for wheezing or shortness of breath. 09/12/15   Garlon Hatchet, PA-C  albuterol (PROVENTIL) (2.5 MG/3ML) 0.083% nebulizer solution Take 3 mLs (2.5 mg total) by nebulization every 6 (six) hours as needed for wheezing or shortness of breath. 04/30/16   Lizbeth Bark, FNP  amoxicillin-clavulanate (AUGMENTIN) 875-125 MG tablet Take 1 tablet by mouth every 12 (twelve) hours. 02/21/16   Barrett Henle, PA-C  benzonatate (TESSALON) 100 MG capsule Take 2 capsules (200 mg total) by mouth 2 (two) times daily as needed for cough. 02/21/16   Barrett Henle, PA-C  budesonide-formoterol [facility]) 160-4.5 MCG/ACT inhaler Inhale 2 puffs into the lungs 2 (two) times daily. 01/18/16   Julio Sicks, NP  budesonide-formoterol (  SYMBICORT) 160-4.5 MCG/ACT inhaler Inhale 2 puffs into the lungs 2 (two) times daily. 04/30/16   Lizbeth Bark, FNP  dextromethorphan 15 MG/5ML syrup Take 10 mLs (30 mg total) by mouth 4 (four) times daily as needed for cough. 02/16/16   Gerhard Munch, MD  FLUoxetine (PROZAC) 20 MG tablet Take 3 tablets (60 mg total) by mouth daily. Take 3 tablets ONCE daily to equal 60 mg TOTAL 10/31/15   Pete Glatter, MD  fluticasone (FLONASE) 50 MCG/ACT nasal spray Place 2 sprays into both nostrils daily. Patient not taking: Reported on 01/18/2016 08/21/15   Pete Glatter, MD  hydrochlorothiazide (MICROZIDE) 12.5 MG capsule Take 1 capsule (12.5 mg total) by mouth  daily. Patient not taking: Reported on 01/18/2016 10/31/15   Pete Glatter, MD  metoprolol tartrate (LOPRESSOR) 25 MG tablet Take 1 tablet (25 mg total) by mouth 2 (two) times daily. 04/30/16   Lizbeth Bark, FNP  montelukast (SINGULAIR) 10 MG tablet Take 1 tablet (10 mg total) by mouth at bedtime. Patient not taking: Reported on 01/18/2016 07/31/15   Pete Glatter, MD  naproxen (NAPROSYN) 500 MG tablet Take 1 tablet (500 mg total) by mouth 2 (two) times daily. 04/21/16   Emi Holes, PA-C  ondansetron (ZOFRAN ODT) 4 MG disintegrating tablet Take 1 tablet (4 mg total) by mouth every 8 (eight) hours as needed for nausea or vomiting. 07/07/16   Lennette Bihari, MD  oseltamivir (TAMIFLU) 75 MG capsule Take 1 capsule (75 mg total) by mouth daily. 04/30/16   Lizbeth Bark, FNP  predniSONE (DELTASONE) 20 MG tablet Take 2 tablets (40 mg total) by mouth daily with breakfast. For the next four days 02/16/16   Gerhard Munch, MD  Prenat-FeCbn-FeAsp-Meth-FA-DHA (PRENATE MINI) 18-0.6-0.4-350 MG CAPS Take 1 capsule by mouth daily. Reported on 07/31/2015 07/07/15   Historical Provider, MD  promethazine (PHENERGAN) 25 MG tablet Take 25 mg by mouth every 4 (four) hours as needed. n/v 11/08/15   Historical Provider, MD  QVAR 80 MCG/ACT inhaler inhale 2 puff by mouth once daily 07/07/15   Historical Provider, MD    Family History Family History  Problem Relation Age of Onset  . Hearing loss Mother   . Asthma Mother   . Asthma Father   . Diabetes Son   . Cancer Maternal Grandfather     bone  . Liver disease Maternal Grandfather   . Asthma Sister   . Lupus Sister     Social History Social History  Substance Use Topics  . Smoking status: Current Some Day Smoker    Packs/day: 0.50    Years: 15.00    Types: Cigarettes  . Smokeless tobacco: Never Used     Comment: Smoking .5 ppd  . Alcohol use No     Comment: very occassionally     Allergies   Penicillins and Norvasc [amlodipine  besylate]   Review of Systems Review of Systems  Constitutional: Negative for chills and fever.  HENT: Negative for ear pain and sore throat.   Eyes: Negative for pain and visual disturbance.  Respiratory: Negative for cough and shortness of breath.   Cardiovascular: Negative for chest pain and palpitations.  Gastrointestinal: Positive for abdominal pain, diarrhea, nausea and vomiting.  Genitourinary: Positive for difficulty urinating and dysuria. Negative for hematuria.  Musculoskeletal: Negative for arthralgias and back pain.  Skin: Negative for color change and rash.  Neurological: Negative for seizures and syncope.  All other systems reviewed and are negative.  Physical Exam Updated Vital Signs BP 135/97   Pulse 77   Temp 98.6 F (37 C) (Oral)   Resp 18   LMP 06/30/2016   SpO2 98%   Physical Exam  Constitutional: She is oriented to person, place, and time. She appears well-developed and well-nourished. No distress.  HENT:  Head: Normocephalic and atraumatic.  Eyes: Conjunctivae and EOM are normal.  Neck: Neck supple.  Cardiovascular: Normal rate and regular rhythm.   No murmur heard. Pulmonary/Chest: Effort normal and breath sounds normal. No respiratory distress. She has no wheezes. She has no rales.  Abdominal: Soft. She exhibits no distension and no mass. There is tenderness. There is no rebound and no guarding.  Mild diffuse upper abdominal tenderness. Most tender in RUQ. Tenderness extends along right flank and also overlies the lower rib cage  Musculoskeletal: She exhibits no edema, tenderness or deformity.  Neurological: She is alert and oriented to person, place, and time.  Skin: Skin is warm and dry.  Psychiatric: She has a normal mood and affect.  Nursing note and vitals reviewed.    ED Treatments / Results  Labs (all labs ordered are listed, but only abnormal results are displayed) Labs Reviewed  COMPREHENSIVE METABOLIC PANEL - Abnormal; Notable  for the following:       Result Value   Creatinine, Ser 1.07 (*)    Calcium 8.8 (*)    All other components within normal limits  CBC - Abnormal; Notable for the following:    WBC 11.5 (*)    Hemoglobin 15.2 (*)    HCT 46.3 (*)    All other components within normal limits  URINALYSIS, ROUTINE W REFLEX MICROSCOPIC - Abnormal; Notable for the following:    APPearance HAZY (*)    Hgb urine dipstick SMALL (*)    Protein, ur 30 (*)    Bacteria, UA RARE (*)    Squamous Epithelial / LPF 6-30 (*)    All other components within normal limits  LIPASE, BLOOD  I-STAT BETA HCG BLOOD, ED ([facility], [facility], [facility] ONLY)    EKG  EKG Interpretation None       Radiology [place] Abdomen Limited Ruq  Result Date: 07/07/2016 CLINICAL DATA:  Right-sided abdominal pain with nausea vomiting and diarrhea EXAM: US ABDOMEN LIMITED - RIGHT UPPER QUADRANT COMPARISON:  None. FINDINGS: Gallbladder: 7 mm mobile stone in the gallbladder. Normal wall thickness. Negative sonographic Murphy's. Common bile duct: Diameter: Normal at 2 mm Liver: No focal lesion identified. Within normal limits in parenchymal echogenicity. IMPRESSION: Cholelithiasis without sonographic evidence for acute cholecystitis. No biliary dilatation. Electronically Signed   By: Jasmine PangKim  Fujinaga M.D.   On: 07/07/2016 18:21    Procedures Procedures (including critical care time)   EMERGENCY DEPARTMENT BILIARY ULTRASOUND INTERPRETATION "Study: Limited Abdominal Ultrasound of the Gallbladder and Common Bile Duct."  INDICATIONS: Abdominal pain and Nausea and vomiting Indication: Multiple views of the gallbladder and common bile duct were obtained in real-time with a Multi-frequency probe."  PERFORMED BY:  Myself IMAGES ARCHIVED?: Yes LIMITATIONS: None INTERPRETATION: Cholelithiasis, Gallbladder wall thickened >193mm, Sonographic Murphy's sign abscent and Common bile duct normal in size   Medications Ordered in ED Medications  ondansetron (ZOFRAN-ODT)  disintegrating tablet 4 mg (4 mg Oral Given 07/07/16 1605)     Initial Impression / Assessment and Plan / ED Course  I have reviewed the triage vital signs and the nursing notes.  Pertinent labs & imaging results that were available during my care of the patient were reviewed  by me and considered in my medical decision making (see chart for details).    Patient is a 37 year old female with history as above who presents with 1 day of nausea, vomiting, diarrhea, and right upper quadrant abdominal pain. She reports her son has similar symptoms which started yesterday. The patient's symptoms started this morning. Her vomiting and stool have been nonbloody. Her symptoms have improved here with Zofran. She is tolerating by mouth. Regarding her right upper quadrant pain, it is diffuse about the abdomen and extends into the right flank and overlies the rib cage. I suspect this is primarily related to musculoskeletal pain in the setting of her emesis. A bedside ultrasound did show cholelithiasis. On my ultrasound, there was concern for some wall thickening although no sonographic Murphy sign. A formal right upper quadrant ultrasound again showed the cholelithiasis, but normal wall thickness and no signs of acute cholecystitis. Patient will be prescribed Zofran. I suspect she has a viral gastroenteritis in addition to symptomatic cholelithiasis. She was advised to follow-up with surgery regarding her cholelithiasis. Return precautions discussed in detail. Patient discharged in stable condition.  Final Clinical Impressions(s) / ED Diagnoses   Final diagnoses:  Nausea vomiting and diarrhea  Calculus of gallbladder without cholecystitis without obstruction  RUQ pain    New Prescriptions New Prescriptions   ONDANSETRON (ZOFRAN ODT) 4 MG DISINTEGRATING TABLET    Take 1 tablet (4 mg total) by mouth every 8 (eight) hours as needed for nausea or vomiting.     Lennette Bihari, MD 07/07/16 [phone]

## 2016-07-07 NOTE — ED Provider Notes (Signed)
I have personally seen and examined the patient. I have reviewed the documentation on PMH/FH/Soc Hx. I have discussed the plan of care with the resident and patient.  I have reviewed and agree with the resident's documentation. Please see associated encounter note.   EKG Interpretation None         Nira ConnPedro Eduardo Rutger Salton, MD 07/07/16 [phone]

## 2016-07-07 NOTE — ED Triage Notes (Signed)
Pt reports right side abd paiin with n/v/d x 2 days.

## 2016-07-07 NOTE — ED Notes (Signed)
ED Provider at bedside. 

## 2016-07-18 ENCOUNTER — Ambulatory Visit: Payer: Self-pay | Admitting: Surgery

## 2016-07-18 NOTE — H&P (Signed)
[name] 07/18/2016 11:13 AM Location: [facility] Patient #: [id number] DOB: 01/03/1981 Single / Language: [place] / Race: White Female  History of Present Illness ([name] A. Fredricka Bonineonnor MD; 07/18/2016 11:38 AM) Patient words: Otherwise fairly healthy 36 year old woman who is been having bouts of right upper quadrant pain for several years, this is associated with nausea. She has not noticed whether it is provoked by eating or relieved by anything in particular. A couple weeks ago she developed severe right upper quadrant pain with associated nausea and emesis but no fevers, and was seen in the emergency department. She also reports some associated diarrhea and fatigue. The pain is always in the right upper quadrant and radiates to her back. Her labs during her emergency department visit were unremarkable and she had an upper quadrant ultrasound that showed a 7 mm stone but no signs of cholecystitis. Her common bile duct was 2 mm.  The patient is a 36 year old female.   Past Surgical History [name]([name], CMA; 07/18/2016 11:13 AM) Cesarean Section - Multiple  Diagnostic Studies History [name]([name], CMA; 07/18/2016 11:13 AM) Mammogram within last year  Allergies [name]([name], CMA; 07/18/2016 11:15 AM) Penicillins Nausea, Vomiting. AmLODIPine Besylate *CALCIUM CHANNEL BLOCKERS*  Medication History ([name], CMA; 07/18/2016 11:18 AM) Proventil ((2.5 MG/3ML)0.083% Nebulized Soln, Inhalation) Active. Symbicort (160-4.5MCG/ACT Aerosol, Inhalation) Active. Latuda (40MG  Tablet, Oral) Active. Flonase Allergy Relief (50MCG/ACT Suspension, Nasal) Active. Metoprolol Tartrate (25MG  Tablet, Oral) Active. HydroCHLOROthiazide (12.5MG  Capsule, Oral) Active. Qvar (80MCG/ACT Aerosol Soln, Inhalation) Active. Medications Reconciled  Social History [name]([name], CMA; 07/18/2016 11:13 AM) Alcohol use Occasional alcohol use. Caffeine use Carbonated beverages, Coffee. No drug  use Tobacco use Current every day smoker.  Family History [name]([name], CMA; 07/18/2016 11:13 AM) Alcohol Abuse Father, Mother. Cancer Family Members In General. Depression Father, Mother. Diabetes Mellitus Son. Hypertension Father. Ischemic Bowel Disease Father, Mother. Melanoma Father. Respiratory Condition Family Members In General. Seizure disorder Mother. Thyroid problems Mother.  Pregnancy / Birth History [name]([name], CMA; 07/18/2016 11:13 AM) Age at menarche 15 years. Gravida 4 Irregular periods Maternal age 34-25 Para 2  Other Problems [name]([name], CMA; 07/18/2016 11:13 AM) Anxiety Disorder Asthma Back Pain Cholelithiasis Gastroesophageal Reflux Disease High blood pressure     Review of Systems ([name] CMA; 07/18/2016 11:13 AM) General Present- Chills, Fatigue, Fever, Night Sweats and Weight Gain. Not Present- Appetite Loss and Weight Loss. Skin Present- Dryness. Not Present- Change in Wart/Mole, Hives, Jaundice, New Lesions, Non-Healing Wounds, Rash and Ulcer. HEENT Present- Earache and Visual Disturbances. Not Present- Hearing Loss, Hoarseness, Nose Bleed, Oral Ulcers, Ringing in the Ears, Seasonal Allergies, Sinus Pain, Sore Throat, Wears glasses/contact lenses and Yellow Eyes. Respiratory Present- Chronic Cough, Difficulty Breathing and Wheezing. Not Present- Bloody sputum and Snoring. Cardiovascular Present- Chest Pain, Leg Cramps, Rapid Heart Rate and Shortness of Breath. Not Present- Difficulty Breathing Lying Down, Palpitations and Swelling of Extremities. Gastrointestinal Present- Abdominal Pain, Bloating, Chronic diarrhea, Hemorrhoids, Indigestion and Nausea. Not Present- Bloody Stool, Change in Bowel Habits, Constipation, Difficulty Swallowing, Excessive gas, Gets full quickly at meals, Rectal Pain and Vomiting. Female Genitourinary Present- Frequency, Nocturia, Painful Urination and Urgency. Not Present- Pelvic Pain. Musculoskeletal  Present- Back Pain. Not Present- Joint Pain, Joint Stiffness, Muscle Pain, Muscle Weakness and Swelling of Extremities. Neurological Present- Decreased Memory and Headaches. Not Present- Fainting, Numbness, Seizures, Tingling, Tremor, Trouble walking and Weakness. Psychiatric Present- Anxiety, Change in Sleep Pattern, Depression and Frequent crying. Not Present- Bipolar and Fearful. Endocrine Present-  Hot flashes. Not Present- Cold Intolerance, Excessive Hunger, Hair Changes, Heat Intolerance and New Diabetes. Hematology Present- Easy Bruising. Not Present- Blood Thinners, Excessive bleeding, Gland problems, HIV and Persistent Infections.  Vitals ([name] CMA; 07/18/2016 11:14 AM) 07/18/2016 11:13 AM Weight: 192.38 lb Height: 64in Body Surface Area: 1.92 m Body Mass Index: 33.02 kg/m  Temp.: 98.55F  Pulse: 77 (Regular)  BP: 138/92 (Sitting, Left Arm, Standard)      Physical Exam (Tyland Klemens A. Fredricka Bonine MD; 07/18/2016 11:39 AM)  General Note: Alert and oriented, no distress  Integumentary Note: No lesions or rashes on limited skin exam  Head and Neck Note: No mass or thyromegaly  Eye Note: Anicteric, extra ocular motion intact  ENMT Note: Moist mucous membranes, good dentition  Chest and Lung Exam Note: Unlabored respirations, symmetrical air entry  Cardiovascular Note: Regular rate and rhythm, no pedal edema  Abdomen Note: Obese, soft, nondistended. Mildly tender in the right upper quadrant. No palpable mass or organomegaly  Neurologic Note: Grossly intact, normal gait  Neuropsychiatric Note: Normal mood and affect, appropriate insight  Musculoskeletal Note: Strength symmetrical throughout, no deformity    Assessment & Plan (Cobin Cadavid A. Fredricka Bonine MD; 07/18/2016 11:40 AM)  BILIARY COLIC (K80.50) Story: She has classical symptoms and stones on her ultrasound. I recommended A cholecystectomy and described procedure in detail. We discussed the risks of  bleeding, pain, scarring, injury to intercostal structures specifically the common bile duct and sequelae, as well as the risk of conversion to an open procedure. She asked appropriate questions which were answered. She would like to proceed with surgery.

## 2016-07-27 IMAGING — CT CT ANGIO CHEST
1 of 2 series · 19 of 32 positions shown · IV contrast (OMNIPAQUE)
Comparison: None.

CLINICAL DATA: Initial encounter for 30 weeks gestation with
shortness of breath and mid left-side chest pain.

EXAM:
CT ANGIOGRAPHY CHEST WITH CONTRAST
TECHNIQUE: Multidetector CT imaging of the chest was performed using the
standard protocol during bolus administration of intravenous
contrast. Multiplanar CT image reconstructions and MIPs were
obtained to evaluate the vascular anatomy.
CONTRAST:  100mL OMNIPAQUE IOHEXOL 350 MG/ML SOLN

[Series 5: (person_name) thins · axial · 0.74mm/px · z∈[-313,-50]mm · 19 of 418 slices shown]
[im 21/418  lung]
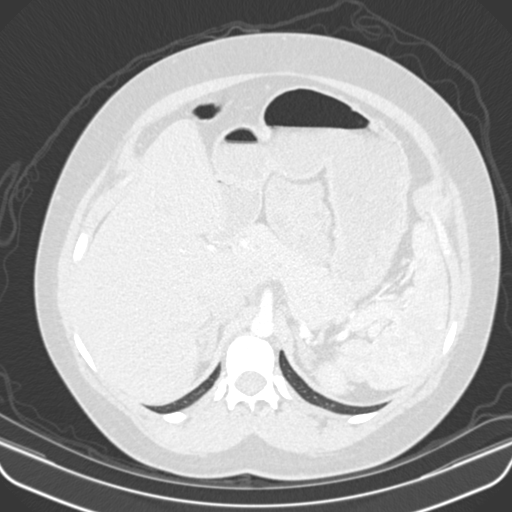
[im 42/418  mediastinal]
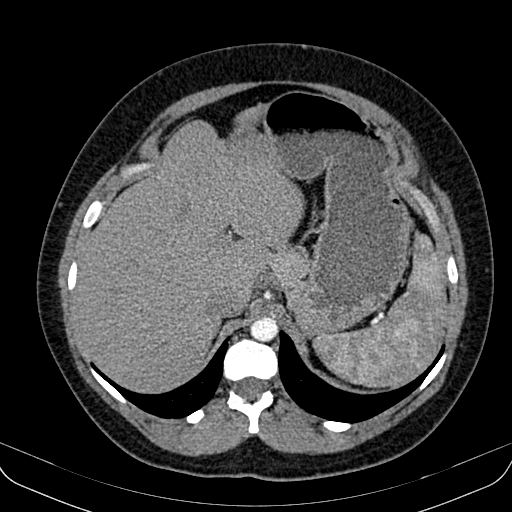
[im 63/418  lung]
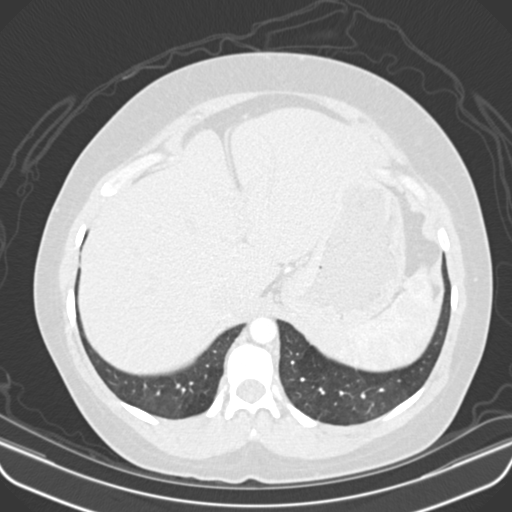
[im 105/418  mediastinal]
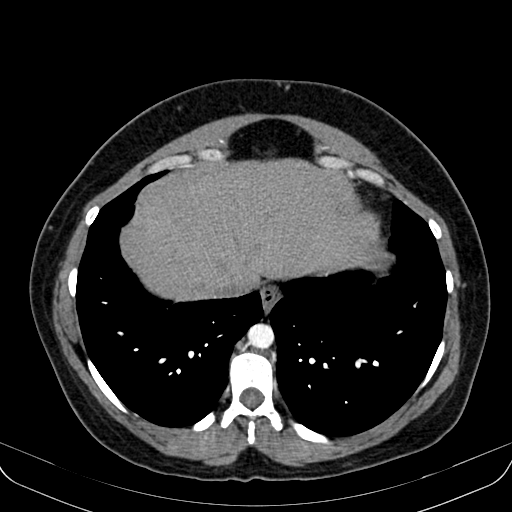
[im 126/418  lung]
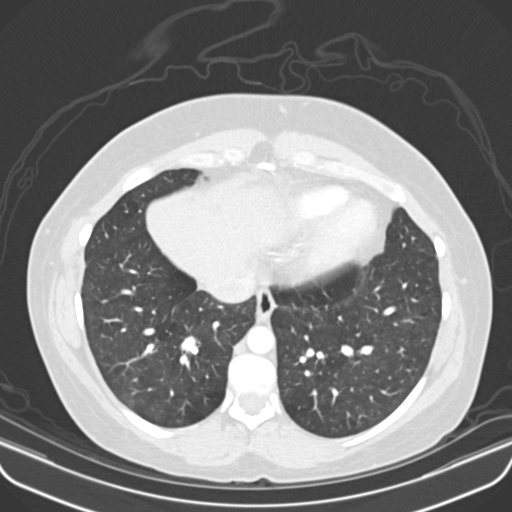
[im 140/418  mediastinal]
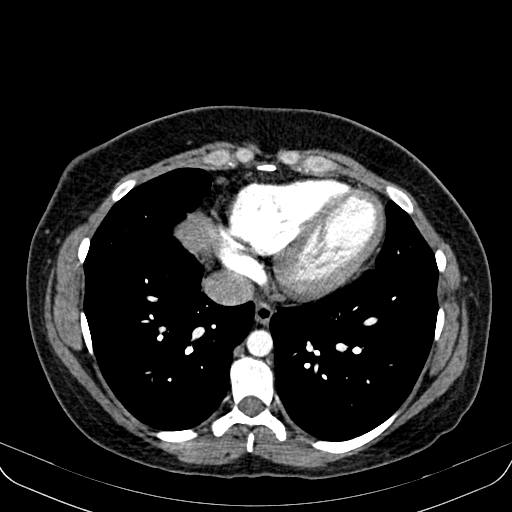
[im 146/418  lung]
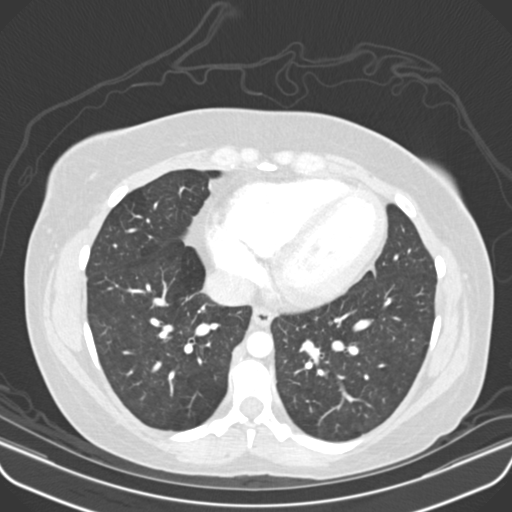
[im 167/418  mediastinal]
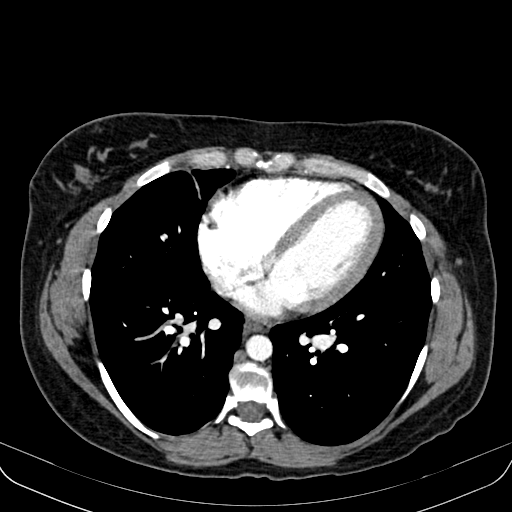
[im 188/418  lung]
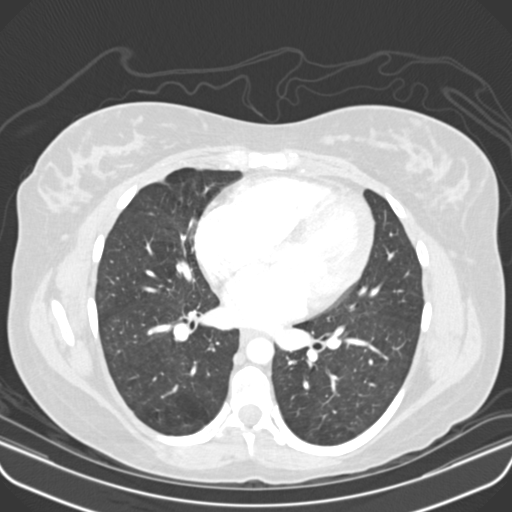
[im 209/418  mediastinal]
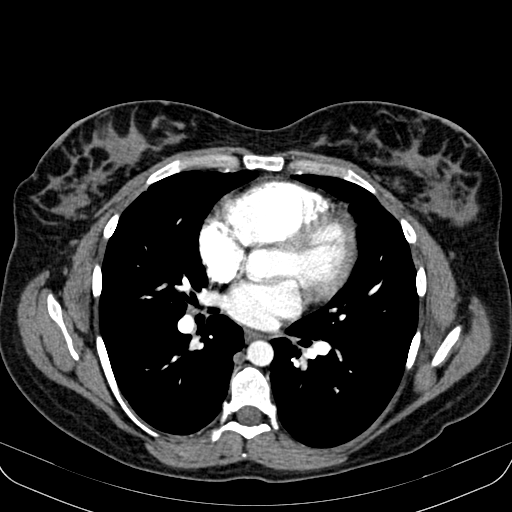
[im 230/418  lung]
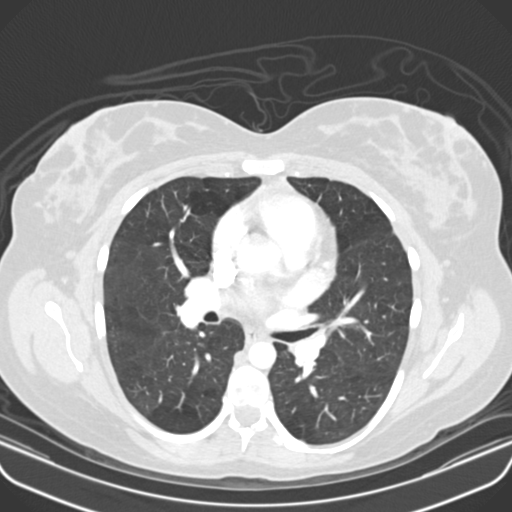
[im 251/418  mediastinal]
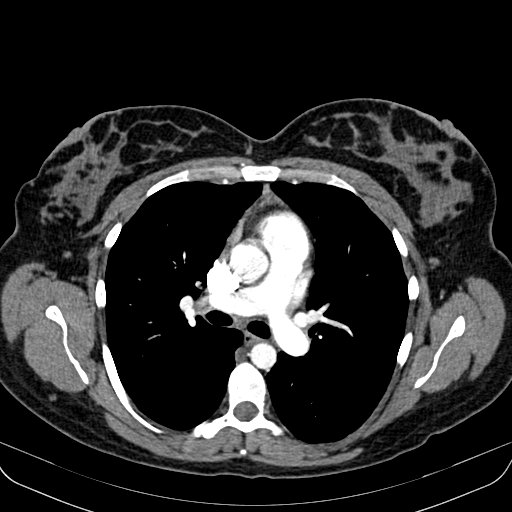
[im 272/418  lung]
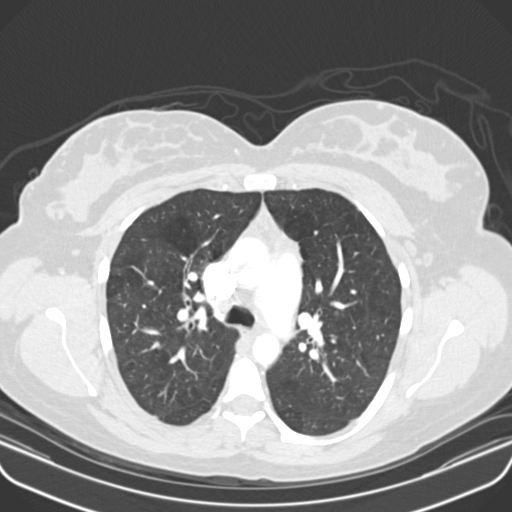
[im 279/418  mediastinal]
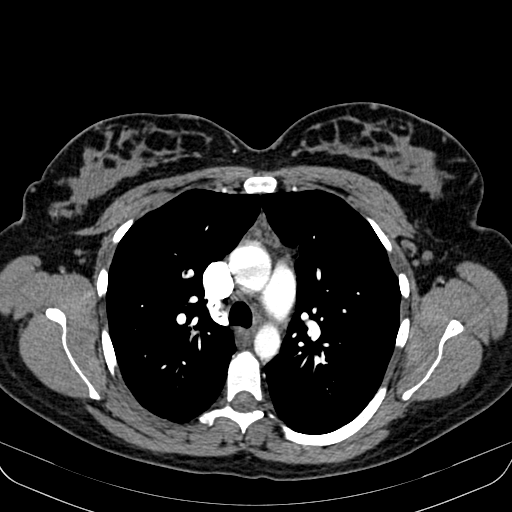
[im 292/418  lung]
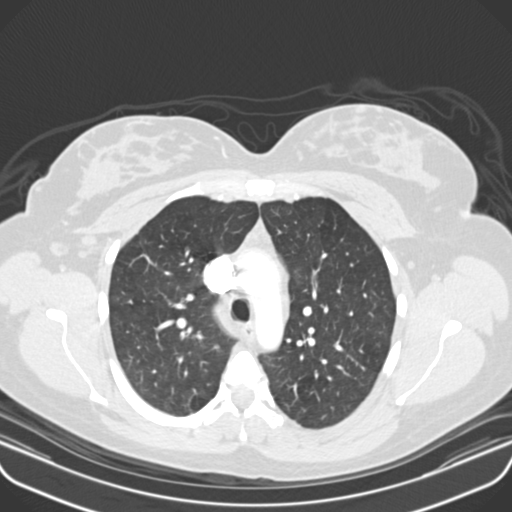
[im 313/418  mediastinal]
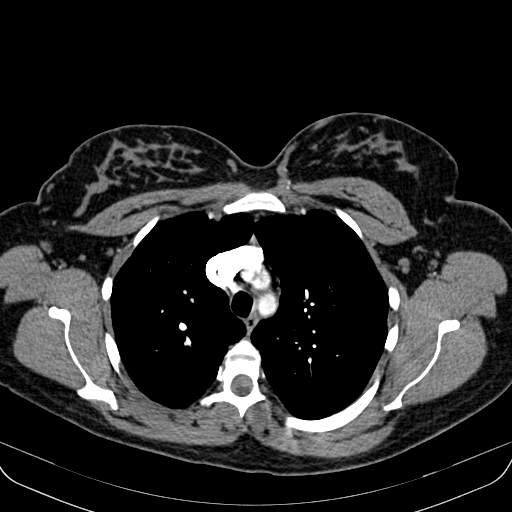
[im 355/418  lung]
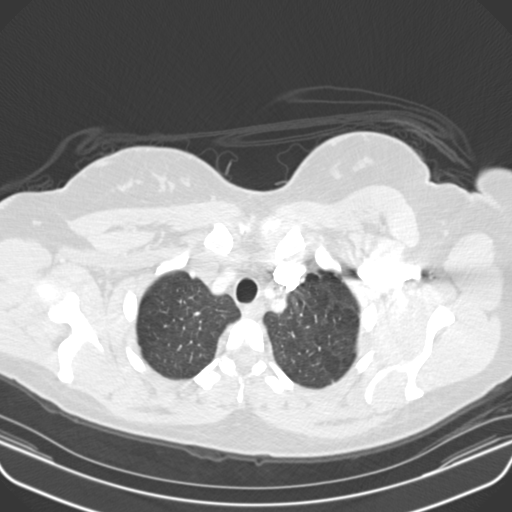
[im 376/418  mediastinal]
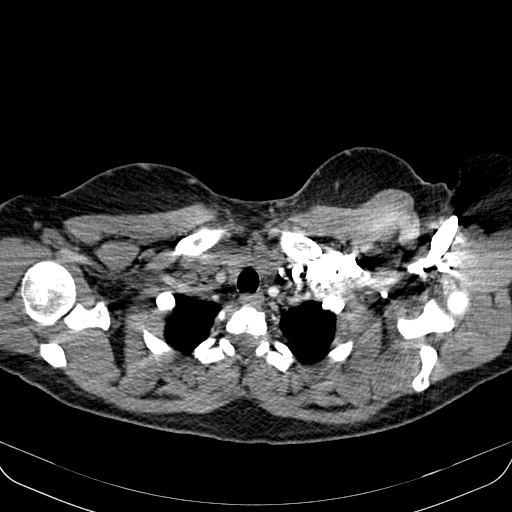
[im 397/418  lung]
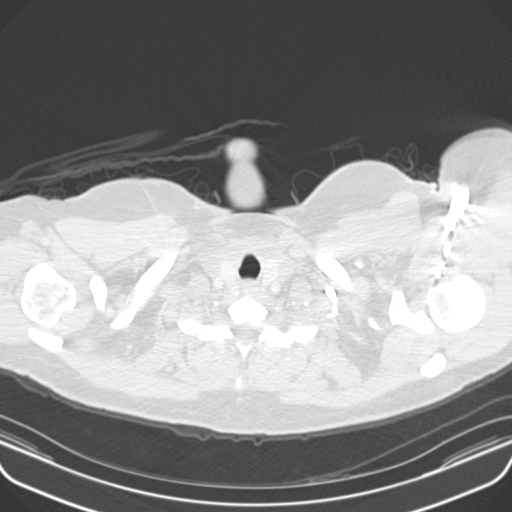

[19 of 32 positions shown; findings below may reference images not displayed]

FINDINGS: Mediastinum / Lymph Nodes: No evidence for filling defect in the
opacified pulmonary arteries to suggest the presence of an acute
pulmonary embolus. Thoracic aorta shows no evidence for intraluminal
dissection flap. No evidence for thoracic aortic dissection.

There is no axillary lymphadenopathy. Wispy soft tissue attenuation
in the anterior mediastinum is compatible with thymic remnant. No
mediastinal lymphadenopathy. There is no hilar lymphadenopathy. The
esophagus has normal imaging features. Heart size is normal. No
pericardial effusion.

Lungs / Pleura: Lung windows show scattered areas of mosaic
attenuation. Small vessels in the areas of hypoattenuation are
decreased, compatible with underlying hypoperfusion. No focal
airspace consolidation. No pulmonary edema or pleural effusion. No
worrisome or suspicious pulmonary nodule or mass

Upper Abdomen: Visualized portions of the liver and spleen are
unremarkable.

[HOSPITAL] / Soft Tissues: Bone windows reveal no worrisome lytic or
sclerotic osseous lesions.

Review of the MIP images confirms the above findings.
IMPRESSION: 1. No CT evidence for acute pulmonary embolus.
2. Mosaic attenuation in the lungs, consistent with mosaic
perfusion. This pattern can be seen in cases of small airways
disease (hypersensitivity pneumonitis, respiratory bronchiolitis,
and connective tissue disease). Chronic pulmonary embolism can also
generate this appearance, but is considered less likely in this
individual.

## 2016-08-07 ENCOUNTER — Telehealth: Payer: Self-pay | Admitting: Internal Medicine

## 2016-08-07 NOTE — Telephone Encounter (Signed)
Left message on voicemail, from number listed. Will try on a different number in chart as well.  BP reading 155/120. She states she has had higher readings the  last 3 days, SBP range100-170 Taking medications daily HCTZ and metoprolol. Has intermittent headache and chest discomfort. At time when her  face feels flushed. She denies SOB or blurred vision.  Next day appt made with provider. Pt instructed to go to ED is sx's worsen and/or constant.

## 2016-08-07 NOTE — Telephone Encounter (Signed)
Patient called the office to speak with nurse regarding her bp. Patient stated that her bp has been running high this whole week and would like her bp medication to be adjusted. Please follow up.  Thank you.

## 2016-08-08 ENCOUNTER — Encounter: Payer: Self-pay | Admitting: Physician Assistant

## 2016-08-08 ENCOUNTER — Ambulatory Visit: Payer: Medicaid Other | Attending: Physician Assistant | Admitting: Physician Assistant

## 2016-08-08 VITALS — BP 153/99 | HR 82 | Temp 98.4°F | Wt 193.4 lb

## 2016-08-08 DIAGNOSIS — J45909 Unspecified asthma, uncomplicated: Secondary | ICD-10-CM | POA: Insufficient documentation

## 2016-08-08 DIAGNOSIS — Z880 Allergy status to penicillin: Secondary | ICD-10-CM | POA: Diagnosis not present

## 2016-08-08 DIAGNOSIS — I10 Essential (primary) hypertension: Secondary | ICD-10-CM | POA: Insufficient documentation

## 2016-08-08 DIAGNOSIS — F419 Anxiety disorder, unspecified: Secondary | ICD-10-CM | POA: Diagnosis not present

## 2016-08-08 DIAGNOSIS — Z0001 Encounter for general adult medical examination with abnormal findings: Secondary | ICD-10-CM | POA: Insufficient documentation

## 2016-08-08 DIAGNOSIS — Z79899 Other long term (current) drug therapy: Secondary | ICD-10-CM | POA: Diagnosis not present

## 2016-08-08 MED ORDER — HYDROCHLOROTHIAZIDE 12.5 MG PO CAPS: ORAL_CAPSULE | ORAL | 1 refills | Status: DC

## 2016-08-08 NOTE — Patient Instructions (Signed)

## 2016-08-08 NOTE — Progress Notes (Signed)
Melissa Leighllenjane Boeder, is a 36 y.o. female  ZOX:[id number]  WJX:[id number]  DOB - Aug 31, 1980  Subjective:  Chief Complaint and HPI: [name] is a 36 y.o. female here today because her BP has been elevated since starting a new medication Dr Gwyndolyn KaufmanSenna prescribed about 2 weeks ago.  She was started on Pristiz and it seems her BP has been elevated since then.  She has been checking it at home and getting 160s/90-100.  It is causing her to have HA.  No CP.  No vision changes.  No SOB.  She is currently taking  metoprolol tartrate 25mg  bid and HCTZ 12.5mg  daily.  No new stressors.  No change in diet.  Denies alcohol/substance use/abuse.   Most recent labs reviewed.  BP synopsis reviewed.  122/68 and 122/80 on 04/30/2016 and 07/07/2016 respectively  Denies SI/HI.  ROS:   Constitutional:  No f/c, No night sweats, No unexplained weight loss. EENT:  No vision changes, No blurry vision, No hearing changes. No mouth, throat, or ear problems.  Respiratory: No cough, No SOB Cardiac: No CP, no palpitations GI:  No abd pain, No N/V/D. GU: No Urinary s/sx Musculoskeletal: No joint pain Neuro: + headache when BP has been elevated, no dizziness, no motor weakness.  Skin: No rash Endocrine:  No polydipsia. No polyuria.  Psych: Denies SI/HI  No problems updated.  ALLERGIES: Allergies  Allergen Reactions  . Penicillins Nausea And Vomiting  . Norvasc [Amlodipine Besylate] Other (See Comments)    Hypersensitivity? Tip of tongue numb and flushing.    PAST MEDICAL HISTORY: Past Medical History:  Diagnosis Date  . Anxiety   . Asthma   . Depression    doing good, not on meds now  . Headache    migraines  . Hypertension   . Kidney stones   . Missed abortion 10/02/2013  . Ovarian cyst   . Pregnancy induced hypertension   . Vaginal Pap smear, abnormal    bx, f/u ok    MEDICATIONS AT HOME: Prior to Admission medications   Medication Sig Start Date End Date Taking? Authorizing Provider    acetaminophen (TYLENOL) 325 MG tablet Take 1 tablet (325 mg total) by mouth every 6 (six) hours as needed for mild pain, moderate pain or fever. 04/30/16  Yes Lizbeth BarkMandesia R Hairston, FNP  albuterol (PROVENTIL) (2.5 MG/3ML) 0.083% nebulizer solution Take 6 mLs (5 mg total) by nebulization every 6 (six) hours as needed for wheezing or shortness of breath. 09/12/15  Yes Garlon HatchetLisa M Sanders, PA-C  albuterol (PROVENTIL) (2.5 MG/3ML) 0.083% nebulizer solution Take 3 mLs (2.5 mg total) by nebulization every 6 (six) hours as needed for wheezing or shortness of breath. 04/30/16  Yes Lizbeth BarkMandesia R Hairston, FNP  budesonide-formoterol (SYMBICORT) 160-4.5 MCG/ACT inhaler Inhale 2 puffs into the lungs 2 (two) times daily. 01/18/16  Yes Tammy S Parrett, NP  budesonide-formoterol (SYMBICORT) 160-4.5 MCG/ACT inhaler Inhale 2 puffs into the lungs 2 (two) times daily. 04/30/16  Yes Lizbeth BarkMandesia R Hairston, FNP  dextromethorphan 15 MG/5ML syrup Take 10 mLs (30 mg total) by mouth 4 (four) times daily as needed for cough. 02/16/16  Yes Gerhard Munchobert Lockwood, MD  metoprolol tartrate (LOPRESSOR) 25 MG tablet Take 1 tablet (25 mg total) by mouth 2 (two) times daily. 04/30/16  Yes Lizbeth BarkMandesia R Hairston, FNP  naproxen (NAPROSYN) 500 MG tablet Take 1 tablet (500 mg total) by mouth 2 (two) times daily. 04/21/16  Yes Alexandra M Law, PA-C  ondansetron (ZOFRAN ODT) 4 MG disintegrating tablet Take 1  tablet (4 mg total) by mouth every 8 (eight) hours as needed for nausea or vomiting. 07/07/16  Yes Lennette Bihari, MD  oseltamivir (TAMIFLU) 75 MG capsule Take 1 capsule (75 mg total) by mouth daily. 04/30/16  Yes Lizbeth Bark, FNP  predniSONE (DELTASONE) 20 MG tablet Take 2 tablets (40 mg total) by mouth daily with breakfast. For the next four days 02/16/16  Yes Gerhard Munch, MD  Prenat-FeCbn-FeAsp-Meth-FA-DHA (PRENATE MINI) 18-0.6-0.4-350 MG CAPS Take 1 capsule by mouth daily. Reported on 07/31/2015 07/07/15  Yes Historical Provider, MD  promethazine  (PHENERGAN) 25 MG tablet Take 25 mg by mouth every 4 (four) hours as needed. n/v 11/08/15  Yes Historical Provider, MD  QVAR 80 MCG/ACT inhaler inhale 2 puff by mouth once daily 07/07/15  Yes Historical Provider, MD  desvenlafaxine (PRISTIQ) 50 MG 24 hr tablet Take 50 mg by mouth every morning. 07/22/16   Historical Provider, MD  fluticasone (FLONASE) 50 MCG/ACT nasal spray Place 2 sprays into both nostrils daily. Patient not taking: Reported on 01/18/2016 08/21/15   Pete Glatter, MD  hydrochlorothiazide (MICROZIDE) 12.5 MG capsule 2 tabs daily 08/08/16   Marzella Schlein Trevious Rampey, PA-C  LATUDA 40 MG TABS tablet Take 40 mg by mouth 1 day or 1 dose. 07/23/16   Historical Provider, MD  montelukast (SINGULAIR) 10 MG tablet Take 1 tablet (10 mg total) by mouth at bedtime. Patient not taking: Reported on 01/18/2016 07/31/15   Pete Glatter, MD     Objective:  EXAM:   Vitals:   08/08/16 1635  BP: (!) 153/99  Pulse: 82  Temp: 98.4 F (36.9 C)  TempSrc: Oral  SpO2: 96%  Weight: 193 lb 6.4 oz (87.7 kg)    General appearance : A&OX3. NAD. Non-toxic-appearing HEENT: Atraumatic and Normocephalic.  PERRLA. EOM intact.  Neck: supple, no JVD. No cervical lymphadenopathy. No thyromegaly Chest/Lungs:  Breathing-non-labored, Good air entry bilaterally, breath sounds normal without rales, rhonchi, or wheezing  CVS: S1 S2 regular, no murmurs, gallops, rubs  Extremities: Bilateral Lower Ext shows no edema, both legs are warm to touch with = pulse throughout Neurology:  CN II-XII grossly intact, Non focal.   Psych:  TP linear. J/I WNL. Normal speech. Appropriate eye contact and affect.  Skin:  No Rash  Data Review Lab Results  Component Value Date   HGBA1C 5.2 07/29/2014     Assessment & Plan   1. Hypertension, unspecified type Uncontrolled-unsure if this is related to recent start of Pristiq, but it may very well be.  I discussed the case with Viann Fish, clinical pharmacist. BP appears to have been  controlled prior to Pristiq start as it was 122/68 and 122/80 on 04/30/2016 and 07/07/2016 respectively. I have advised the patient to call and schedule an appt with Dr Gwyndolyn Kaufman to discuss stopping/changing the medication.  She says she can usu get an appt quickly.  For now, continue metoprolol and increase dose of HCTZ to 25mg  daily.  Check BP 2 times daily and record and bring to next visit.  To ED if severe HA/CP/dizziness/vision changes/SOB/etc - hydrochlorothiazide (MICROZIDE) 12.5 MG capsule; 2 tabs daily  Dispense: 60 capsule; Refill: 1  Patient have been counseled extensively about nutrition and exercise  Return in about 2 weeks (around 08/22/2016) for f/up Dr Julien Nordmann for htn.  The patient was given clear instructions to go to ER or return to medical center if symptoms don't improve, worsen or new problems develop. The patient verbalized understanding. The patient was told  to call to get lab results if they haven't heard anything in the next week.     Georgian Co, PA-C [facility] [place], [place] [phone]   08/08/2016, 4:56 PMPatient ID: [name], female   DOB: May 08, 1981, 36 y.o.   MRN: [id number]

## 2016-08-16 ENCOUNTER — Encounter (HOSPITAL_COMMUNITY): Payer: Self-pay

## 2016-08-16 ENCOUNTER — Encounter (HOSPITAL_COMMUNITY)
Admission: RE | Admit: 2016-08-16 | Discharge: 2016-08-16 | Disposition: A | Discharge status: Home / Self Care | Payer: Medicaid Other | Source: Ambulatory Visit | Attending: Surgery | Admitting: Surgery

## 2016-08-16 DIAGNOSIS — Z01812 Encounter for preprocedural laboratory examination: Secondary | ICD-10-CM | POA: Insufficient documentation

## 2016-08-16 DIAGNOSIS — Z01810 Encounter for preprocedural cardiovascular examination: Secondary | ICD-10-CM | POA: Diagnosis present

## 2016-08-16 HISTORY — DX: Personal history of urinary calculi: Z87.442

## 2016-08-16 HISTORY — DX: Gastro-esophageal reflux disease without esophagitis: K21.9

## 2016-08-16 LAB — BASIC METABOLIC PANEL
Anion gap: 11 (ref 5–15)
BUN: 13 mg/dL (ref 6–20)
CHLORIDE: 101 mmol/L (ref 101–111)
CO2: 24 mmol/L (ref 22–32)
CREATININE: 1.05 mg/dL — AB (ref 0.44–1.00)
Calcium: 9.7 mg/dL (ref 8.9–10.3)
GFR calc Af Amer: >60 mL/min (ref >60)
GFR calc non Af Amer: >60 mL/min (ref >60)
Glucose, Bld: 93 mg/dL (ref 65–99)
Potassium: 3.3 mmol/L — ABNORMAL LOW (ref 3.5–5.1)
Sodium: 136 mmol/L (ref 135–145)

## 2016-08-16 LAB — CBC
HEMATOCRIT: 44.6 % (ref 36.0–46.0)
HEMOGLOBIN: 15.3 g/dL — AB (ref 12.0–15.0)
MCH: 30.9 pg (ref 26.0–34.0)
MCHC: 34.3 g/dL (ref 30.0–36.0)
MCV: 90.1 fL (ref 78.0–100.0)
Platelets: 265 10*3/uL (ref 150–400)
RBC: 4.95 MIL/uL (ref 3.87–5.11)
RDW: 13.6 % (ref 11.5–15.5)
WBC: 7.1 10*3/uL (ref 4.0–10.5)

## 2016-08-16 LAB — HCG, SERUM, QUALITATIVE: Preg, Serum: NEGATIVE

## 2016-08-16 NOTE — Pre-Procedure Instructions (Signed)
    [name]  08/16/2016      [id number] - [place], [place] - [address] AT CORNER OF [facility] [address] [place] [address] Phone: [phone] Fax: [phone]  [id number] - [place], [place] - [address] AT [facility] [address] [place] [address] Phone: [phone] Fax: [phone]  [facility] - [place], [place] - [address] [place] [address] Phone: [phone] Fax: [phone]    Your procedure is scheduled on Friday, April 6th   Report to [facility] Admitting at 8:45 AM             (posted surgery time 10:45 am - 12:15 pm)   Call this number if you have problems the [facility] of surgery:  [phone], otherwise call [phone] Mon - Fri 8-4:30 pm   Remember:  Do not eat food or drink liquids after midnight Thursday.   Take these medicines the morning of surgery with A SIP OF WATER : Metoprolol.  Please use your inhalers.               4-5 days prior to surgery STOP taking any Vitamins, Herbal Supplements, Anti-inflammatories.   Do not wear jewelry, make-up or nail polish.  Do not wear lotions, powders,  perfumes, or deoderant.  Do not shave 48 hours prior to surgery.     Do not bring valuables to the hospital.  [facility] is not responsible for any belongings or valuables.  Contacts, dentures or bridgework may not be worn into surgery.  Leave your suitcase in the car.  After surgery it may be brought to your room.  Patients discharged the day of surgery will not be allowed to drive home, and you will need someone to stay with you for the first 24 hrs.   Please read over the following fact sheets that you were given. Pain Booklet and Surgical Site Infection Prevention

## 2016-08-21 IMAGING — US US MFM OB FOLLOW-UP
1 series · 14 of 28 positions shown · non-contrast
Comparison: none

[Series 1: us mfm ob follow-up · 47 acquisitions, 14 frames shown]
[im 2/47]
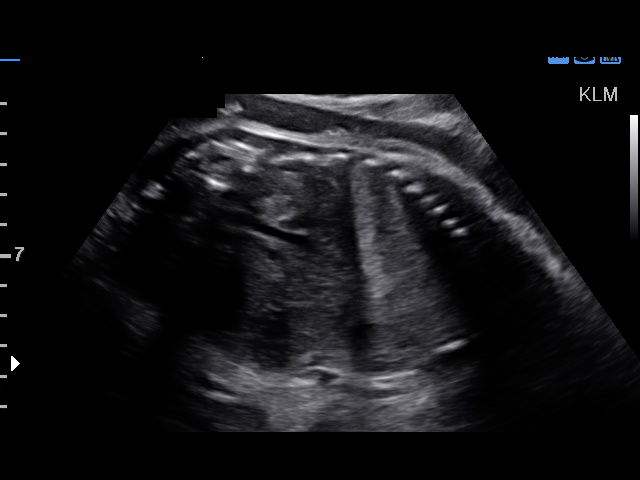
[im 6/47]
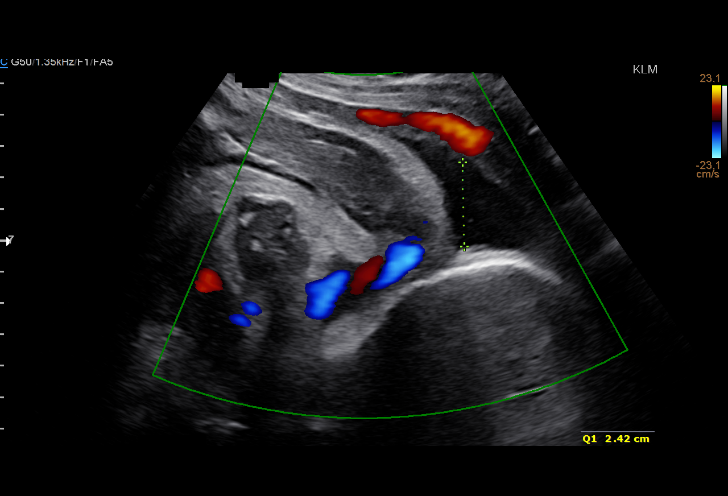
[im 9/47]
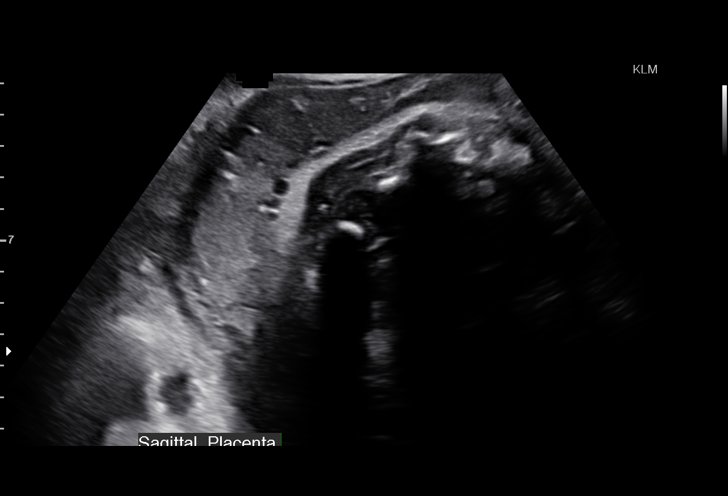
[im 12/47]
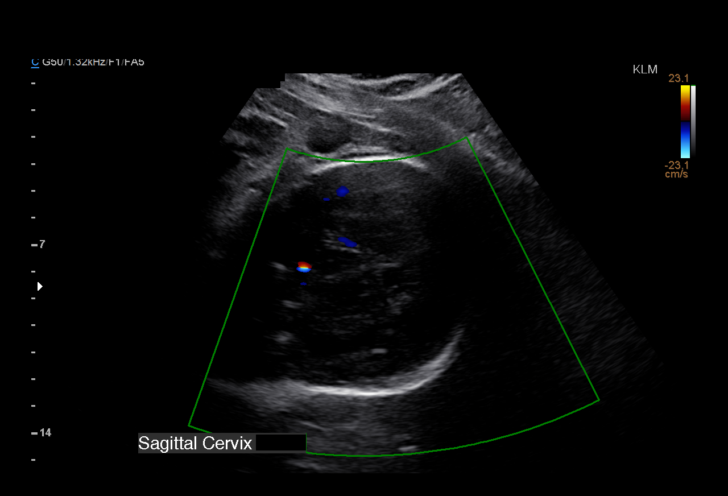
[im 16/47]
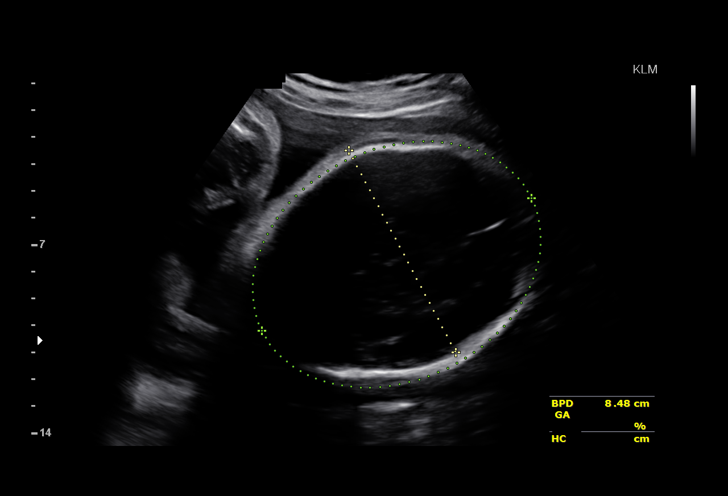
[im 19/47]
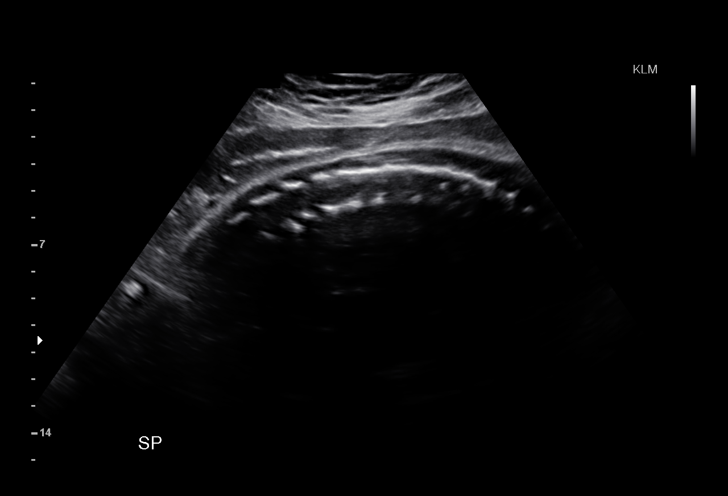
[im 23/47]
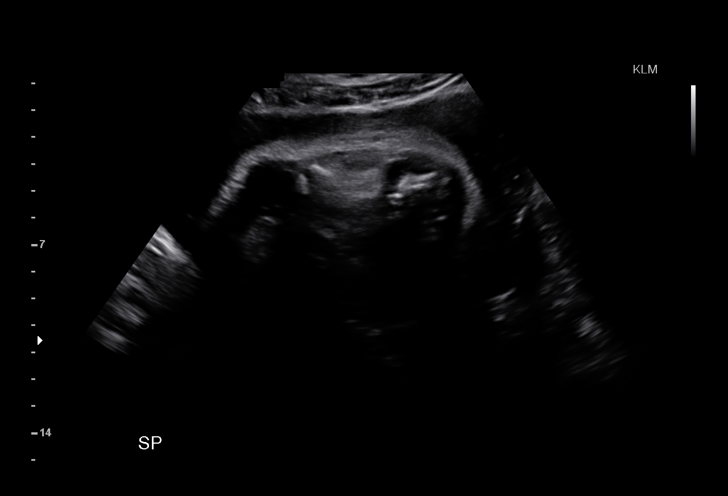
[im 26/47]
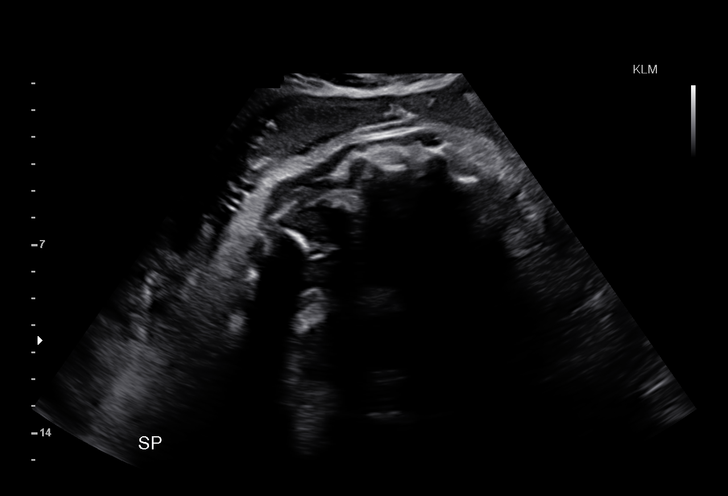
[im 29/47]
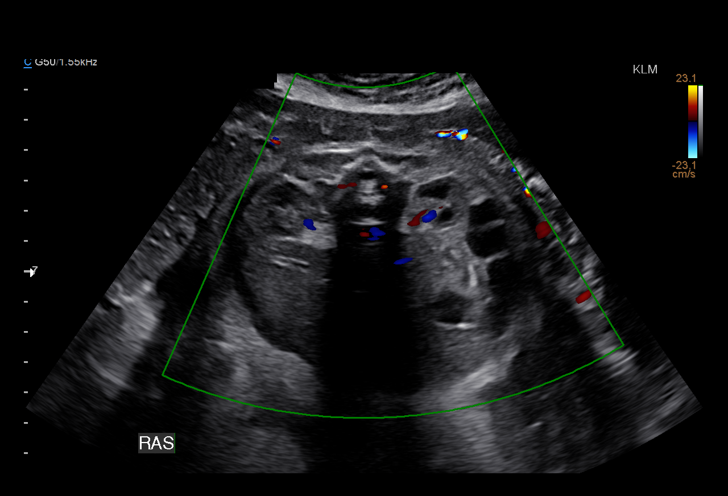
[im 33/47]
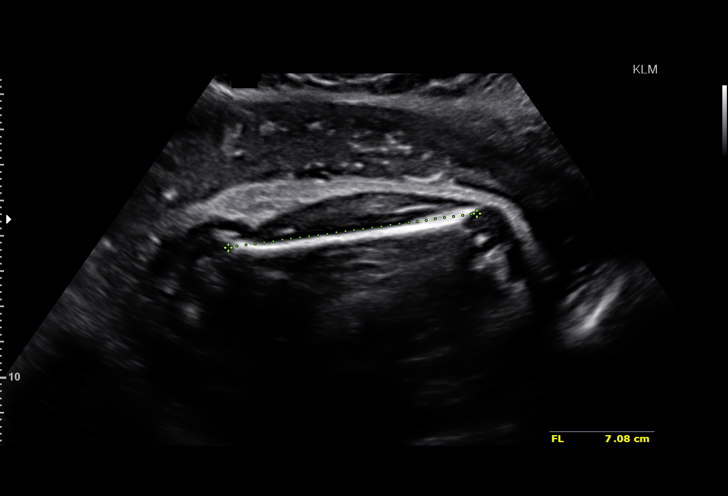
[im 36/47]
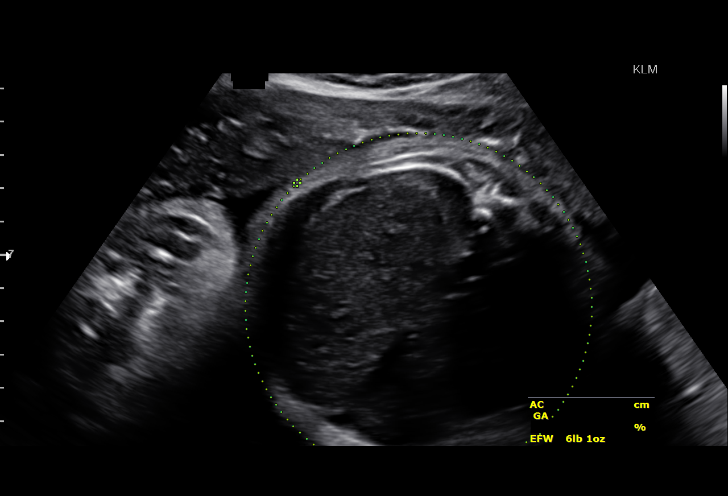
[im 40/47]
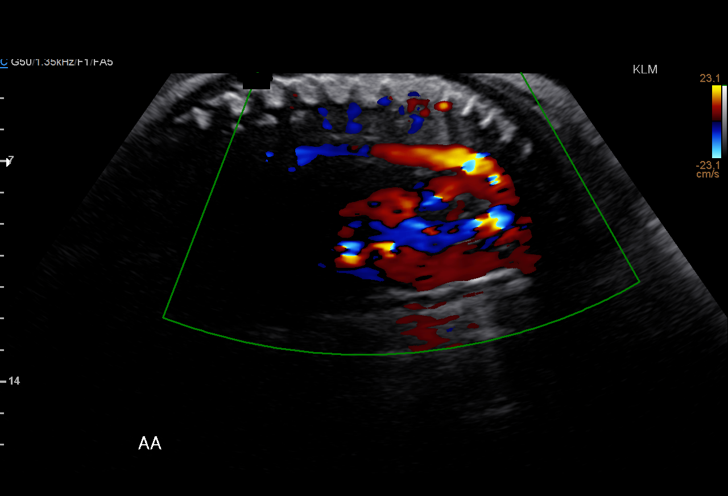
[im 43/47]
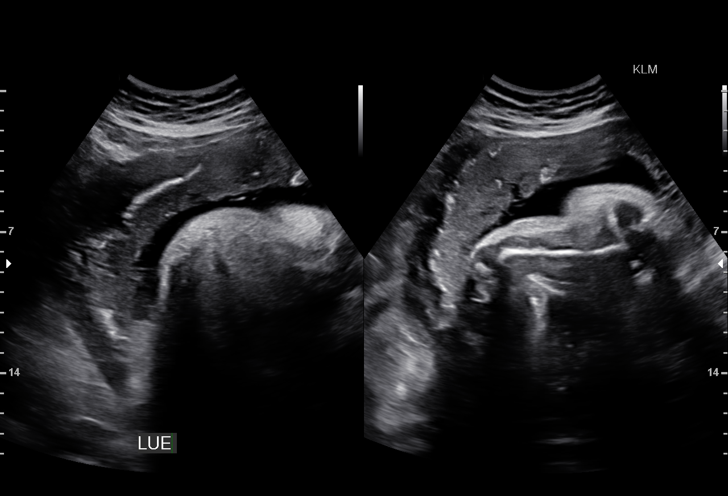
[im 47/47]
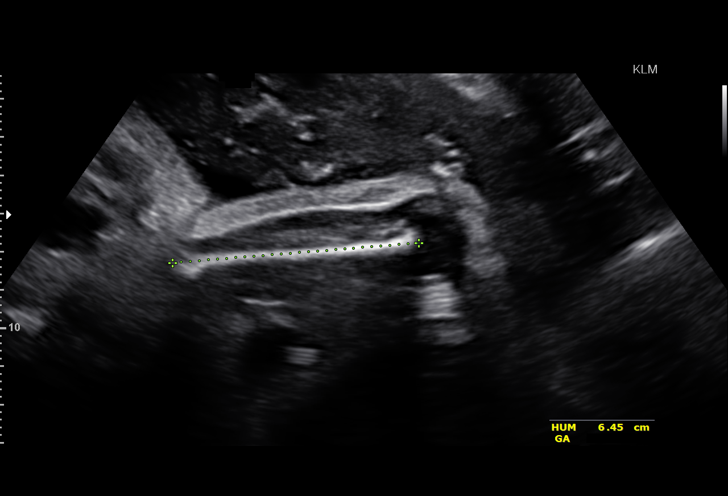

[14 of 28 positions shown; findings below may reference images not displayed]

am)

Date:

1  TUSAMBA                  [id number]       [id number]     [id number]
MAXIMUS
Indications

Hypertension - Chronic/Pre-existing
(labetalol)
History of cesarean delivery, currently
pregnant
Tobacco use complicating pregnancy,
second trimester
Previous cesarean delivery, antepartum
34 weeks gestation of pregnancy
OB History

Height:        5'4"   Weight:   173        BMI:
Gravidity:     4         Term:  1        Prem:    0        SAB:   1
TOP:           1       Ectopic  0        Living:  1
:
Fetal Evaluation

Num Of Fetuses:      1
Fetal Heart          135
Rate(bpm):
Cardiac Activity:    Observed
Presentation:        Compound
Placenta:            Anterior, above cervical os
P. Cord Insertion:   Previously Visualized
Amniotic Fluid
AFI FV:      Subjectively within normal limits
AFI Sum:     9.23     cm      14  %Tile     Larg Pckt:    2.68   cm
RUQ:   2.42    cm    RLQ:   2.01    cm   LUQ:    2.68    cm   LLQ:    2.12   cm
Biometry

BPD:      85.9  mm     G. Age:   34w 5d                  CI:        74.66   %    70 - 86
FL/HC:      22.1   %    20.1 -
HC:      315.5  mm     G. Age:   35w 3d        33   %    HC/AC:      0.96        0.93 -
AC:        329  mm     G. Age:   36w 6d        95   %    FL/BPD      81.3   %    71 - 87
:
FL:       69.8  mm     G. Age:   35w 6d        71   %    FL/AC:      21.2   %    20 - 24

Est.        5671   gm    6 lb 5 oz      83   %
FW:
Gestational Age

LMP:           35w 4d        Date:  09/02/14                  EDD:   06/09/15
U/S Today:     35w 5d                                         EDD:   06/08/15
Best:          34w 5d    Det. By:   Early Ultrasound          EDD:   06/15/15
(10/27/14)
Anatomy

Cranium:          Appears normal         Aortic Arch:       Appears normal
Fetal Cavum:      Previously seen        Ductal Arch:       Previously seen
Ventricles:       Appears normal         Diaphragm:         Previously seen
Choroid Plexus:   Previously seen        Stomach:           Appears normal,
left sided
Cerebellum:       Previously seen        Abdomen:           Appears normal
Posterior         Previously seen        Abdominal          Not well visualized
Fossa:                                   Wall:
Nuchal Fold:      Not applicable (>20    Cord Vessels:      Previously seen
wks GA)
Face:             Orbits and profile     Kidneys:           Appear normal
previously seen
Lips:             Previously seen        Bladder:           Appears normal
Heart:            Not well visualized    Spine:             Previously seen
RVOT:             Not well visualized    Upper              Previously seen
Extremities:
LVOT:             Previously seen        Lower              Previously seen
Extremities:

Other:   Female gender. Heels previously visualized. Technically difficult
due to fetal position.
Cervix Uterus Adnexa

Cervix
Not visualized (advanced GA >04wks)
Impression

Single IUP at 35w 5d
Chronic hypertension on Labetalol
Limited views of the fetal heart obtained due to fetal position
Fetal growth is appropriate (83rd % tile)
Anterior placenta without previa
Normal amniotic fluid volume
Recommendations

Recommend starting antenatal testing - 2x weekly NSTs
with weekly AFIs
Delivery by 39 weeks in the absence of other complications
due to chronic hypertension

## 2016-08-22 MED ORDER — GABAPENTIN 300 MG PO CAPS
300.0000 mg | ORAL_CAPSULE | ORAL | Status: AC
  Administered 2016-08-23: 300 mg via ORAL
  Filled 2016-08-22: qty 1

## 2016-08-22 MED ORDER — CELECOXIB 200 MG PO CAPS
400.0000 mg | ORAL_CAPSULE | ORAL | Status: AC
  Administered 2016-08-23: 400 mg via ORAL
  Filled 2016-08-22: qty 2

## 2016-08-22 MED ORDER — ACETAMINOPHEN 500 MG PO TABS
1000.0000 mg | ORAL_TABLET | ORAL | Status: AC
  Administered 2016-08-23: 1000 mg via ORAL
  Filled 2016-08-22: qty 2

## 2016-08-23 ENCOUNTER — Encounter (HOSPITAL_COMMUNITY): Payer: Self-pay | Admitting: *Deleted

## 2016-08-23 ENCOUNTER — Ambulatory Visit (HOSPITAL_COMMUNITY)
Admission: RE | Admit: 2016-08-23 | Discharge: 2016-08-23 | Disposition: A | Discharge status: Home / Self Care | Payer: Medicaid Other | Source: Ambulatory Visit | Attending: Surgery | Admitting: Surgery

## 2016-08-23 ENCOUNTER — Ambulatory Visit (HOSPITAL_COMMUNITY): Payer: Medicaid Other | Admitting: Certified Registered Nurse Anesthetist

## 2016-08-23 ENCOUNTER — Encounter (HOSPITAL_COMMUNITY)
Admission: RE | Disposition: A | Discharge status: Home / Self Care | Payer: Self-pay | Source: Ambulatory Visit | Attending: Surgery

## 2016-08-23 DIAGNOSIS — K8010 Calculus of gallbladder with chronic cholecystitis without obstruction: Secondary | ICD-10-CM | POA: Diagnosis not present

## 2016-08-23 DIAGNOSIS — Z888 Allergy status to other drugs, medicaments and biological substances status: Secondary | ICD-10-CM | POA: Insufficient documentation

## 2016-08-23 DIAGNOSIS — Z880 Allergy status to penicillin: Secondary | ICD-10-CM | POA: Diagnosis not present

## 2016-08-23 DIAGNOSIS — K660 Peritoneal adhesions (postprocedural) (postinfection): Secondary | ICD-10-CM | POA: Diagnosis not present

## 2016-08-23 DIAGNOSIS — F419 Anxiety disorder, unspecified: Secondary | ICD-10-CM | POA: Diagnosis not present

## 2016-08-23 DIAGNOSIS — I10 Essential (primary) hypertension: Secondary | ICD-10-CM | POA: Insufficient documentation

## 2016-08-23 DIAGNOSIS — J45909 Unspecified asthma, uncomplicated: Secondary | ICD-10-CM | POA: Diagnosis not present

## 2016-08-23 DIAGNOSIS — Z79899 Other long term (current) drug therapy: Secondary | ICD-10-CM | POA: Diagnosis not present

## 2016-08-23 DIAGNOSIS — F17200 Nicotine dependence, unspecified, uncomplicated: Secondary | ICD-10-CM | POA: Diagnosis not present

## 2016-08-23 DIAGNOSIS — K8050 Calculus of bile duct without cholangitis or cholecystitis without obstruction: Secondary | ICD-10-CM | POA: Diagnosis present

## 2016-08-23 HISTORY — PX: CHOLECYSTECTOMY: SHX55

## 2016-08-23 LAB — URINALYSIS, ROUTINE W REFLEX MICROSCOPIC
Glucose, UA: NEGATIVE mg/dL
Ketones, ur: NEGATIVE mg/dL
NITRITE: NEGATIVE
PROTEIN: 30 mg/dL — AB
SPECIFIC GRAVITY, URINE: 1.02 (ref 1.005–1.030)
pH: 7 (ref 5.0–8.0)

## 2016-08-23 LAB — URINALYSIS, MICROSCOPIC (REFLEX): RBC / HPF: NONE SEEN RBC/hpf (ref 0–5)

## 2016-08-23 SURGERY — LAPAROSCOPIC CHOLECYSTECTOMY
Anesthesia: General | Site: Abdomen

## 2016-08-23 MED ORDER — DEXAMETHASONE SODIUM PHOSPHATE 10 MG/ML IJ SOLN
INTRAMUSCULAR | Status: AC
  Filled 2016-08-23: qty 1

## 2016-08-23 MED ORDER — DOCUSATE SODIUM 100 MG PO CAPS: 100.0000 mg | ORAL_CAPSULE | Freq: Two times a day (BID) | ORAL | 0 refills | Status: AC

## 2016-08-23 MED ORDER — BUPIVACAINE HCL (PF) 0.25 % IJ SOLN
INTRAMUSCULAR | Status: AC
  Filled 2016-08-23: qty 30

## 2016-08-23 MED ORDER — FENTANYL CITRATE (PF) 250 MCG/5ML IJ SOLN
INTRAMUSCULAR | Status: DC | PRN
  Administered 2016-08-23: 50 ug via INTRAVENOUS
  Administered 2016-08-23 (×2): 75 ug via INTRAVENOUS
  Administered 2016-08-23: 50 ug via INTRAVENOUS

## 2016-08-23 MED ORDER — SODIUM CHLORIDE 0.9% FLUSH: 3.0000 mL | Freq: Two times a day (BID) | INTRAVENOUS | Status: DC

## 2016-08-23 MED ORDER — CHLORHEXIDINE GLUCONATE 4 % EX LIQD: 60.0000 mL | Freq: Once | CUTANEOUS | Status: DC

## 2016-08-23 MED ORDER — SODIUM CHLORIDE 0.9% FLUSH: 3.0000 mL | INTRAVENOUS | Status: DC | PRN

## 2016-08-23 MED ORDER — FENTANYL CITRATE (PF) 100 MCG/2ML IJ SOLN: 25.0000 ug | INTRAMUSCULAR | Status: DC | PRN

## 2016-08-23 MED ORDER — SULFAMETHOXAZOLE-TRIMETHOPRIM 800-160 MG PO TABS: 1.0000 | ORAL_TABLET | Freq: Two times a day (BID) | ORAL | 0 refills | Status: AC

## 2016-08-23 MED ORDER — PROMETHAZINE HCL 25 MG/ML IJ SOLN: 6.2500 mg | INTRAMUSCULAR | Status: DC | PRN

## 2016-08-23 MED ORDER — FENTANYL CITRATE (PF) 250 MCG/5ML IJ SOLN
INTRAMUSCULAR | Status: AC
  Filled 2016-08-23: qty 5

## 2016-08-23 MED ORDER — VANCOMYCIN HCL IN DEXTROSE 1-5 GM/200ML-% IV SOLN
INTRAVENOUS | Status: AC
  Filled 2016-08-23: qty 200

## 2016-08-23 MED ORDER — MIDAZOLAM HCL 2 MG/2ML IJ SOLN
INTRAMUSCULAR | Status: DC | PRN
  Administered 2016-08-23 (×2): 1 mg via INTRAVENOUS

## 2016-08-23 MED ORDER — 0.9 % SODIUM CHLORIDE (POUR BTL) OPTIME
TOPICAL | Status: DC | PRN
  Administered 2016-08-23: 1000 mL

## 2016-08-23 MED ORDER — VANCOMYCIN HCL IN DEXTROSE 1-5 GM/200ML-% IV SOLN
1000.0000 mg | Freq: Once | INTRAVENOUS | Status: AC
  Administered 2016-08-23: 1000 mg via INTRAVENOUS

## 2016-08-23 MED ORDER — HYDROMORPHONE HCL 1 MG/ML IJ SOLN
0.2500 mg | INTRAMUSCULAR | Status: DC | PRN
  Administered 2016-08-23: 0.5 mg via INTRAVENOUS

## 2016-08-23 MED ORDER — OXYCODONE-ACETAMINOPHEN 5-325 MG PO TABS: 1.0000 | ORAL_TABLET | Freq: Four times a day (QID) | ORAL | 0 refills | Status: DC | PRN

## 2016-08-23 MED ORDER — GLYCOPYRROLATE 0.2 MG/ML IJ SOLN
INTRAMUSCULAR | Status: DC | PRN
  Administered 2016-08-23: 0.2 mg via INTRAVENOUS

## 2016-08-23 MED ORDER — ROCURONIUM BROMIDE 50 MG/5ML IV SOSY
PREFILLED_SYRINGE | INTRAVENOUS | Status: AC
  Filled 2016-08-23: qty 5

## 2016-08-23 MED ORDER — LACTATED RINGERS IV SOLN
INTRAVENOUS | Status: DC
  Administered 2016-08-23 (×2): via INTRAVENOUS

## 2016-08-23 MED ORDER — MIDAZOLAM HCL 2 MG/2ML IJ SOLN
INTRAMUSCULAR | Status: AC
  Filled 2016-08-23: qty 2

## 2016-08-23 MED ORDER — OXYCODONE HCL 5 MG PO TABS
5.0000 mg | ORAL_TABLET | ORAL | Status: DC | PRN
  Administered 2016-08-23: 5 mg via ORAL

## 2016-08-23 MED ORDER — ARTIFICIAL TEARS OP OINT
TOPICAL_OINTMENT | OPHTHALMIC | Status: DC | PRN
  Administered 2016-08-23: 1 via OPHTHALMIC

## 2016-08-23 MED ORDER — SODIUM CHLORIDE 0.9 % IR SOLN
Status: DC | PRN
  Administered 2016-08-23: 1000 mL

## 2016-08-23 MED ORDER — PROPOFOL 10 MG/ML IV BOLUS
INTRAVENOUS | Status: DC | PRN
  Administered 2016-08-23: 200 mg via INTRAVENOUS

## 2016-08-23 MED ORDER — OXYCODONE HCL 5 MG PO TABS
ORAL_TABLET | ORAL | Status: AC
  Filled 2016-08-23: qty 1

## 2016-08-23 MED ORDER — MEPERIDINE HCL 25 MG/ML IJ SOLN: 6.2500 mg | INTRAMUSCULAR | Status: DC | PRN

## 2016-08-23 MED ORDER — ARTIFICIAL TEARS OP OINT
TOPICAL_OINTMENT | OPHTHALMIC | Status: AC
  Filled 2016-08-23: qty 3.5

## 2016-08-23 MED ORDER — PROPOFOL 10 MG/ML IV BOLUS
INTRAVENOUS | Status: AC
  Filled 2016-08-23: qty 20

## 2016-08-23 MED ORDER — HYDROMORPHONE HCL 1 MG/ML IJ SOLN
INTRAMUSCULAR | Status: AC
  Filled 2016-08-23: qty 0.5

## 2016-08-23 MED ORDER — ACETAMINOPHEN 650 MG RE SUPP: 650.0000 mg | RECTAL | Status: DC | PRN

## 2016-08-23 MED ORDER — ACETAMINOPHEN 325 MG PO TABS: 650.0000 mg | ORAL_TABLET | ORAL | Status: DC | PRN

## 2016-08-23 MED ORDER — ROCURONIUM BROMIDE 100 MG/10ML IV SOLN
INTRAVENOUS | Status: DC | PRN
  Administered 2016-08-23: 10 mg via INTRAVENOUS
  Administered 2016-08-23: 40 mg via INTRAVENOUS

## 2016-08-23 MED ORDER — SUGAMMADEX SODIUM 200 MG/2ML IV SOLN
INTRAVENOUS | Status: DC | PRN
  Administered 2016-08-23: 200 mg via INTRAVENOUS

## 2016-08-23 MED ORDER — LIDOCAINE HCL (CARDIAC) 20 MG/ML IV SOLN
INTRAVENOUS | Status: DC | PRN
  Administered 2016-08-23: 100 mg via INTRATRACHEAL

## 2016-08-23 MED ORDER — ONDANSETRON HCL 4 MG/2ML IJ SOLN
INTRAMUSCULAR | Status: DC | PRN
  Administered 2016-08-23: 4 mg via INTRAVENOUS

## 2016-08-23 MED ORDER — DEXAMETHASONE SODIUM PHOSPHATE 10 MG/ML IJ SOLN
INTRAMUSCULAR | Status: DC | PRN
  Administered 2016-08-23: 10 mg via INTRAVENOUS

## 2016-08-23 MED ORDER — LIDOCAINE 2% (20 MG/ML) 5 ML SYRINGE
INTRAMUSCULAR | Status: AC
  Filled 2016-08-23: qty 5

## 2016-08-23 MED ORDER — ONDANSETRON HCL 4 MG/2ML IJ SOLN
INTRAMUSCULAR | Status: AC
  Filled 2016-08-23: qty 2

## 2016-08-23 MED ORDER — SODIUM CHLORIDE 0.9 % IV SOLN: 250.0000 mL | INTRAVENOUS | Status: DC | PRN

## 2016-08-23 MED ORDER — BUPIVACAINE HCL (PF) 0.25 % IJ SOLN
INTRAMUSCULAR | Status: DC | PRN
  Administered 2016-08-23: 25 mL

## 2016-08-23 SURGICAL SUPPLY — 38 items
ADH SKN CLS APL DERMABOND .7 (GAUZE/BANDAGES/DRESSINGS) ×1
APPLIER CLIP 5 13 M/L LIGAMAX5 (MISCELLANEOUS) ×2
APR CLP MED LRG 5 ANG JAW (MISCELLANEOUS) ×1
BAG SPEC RTRVL LRG 6X4 10 (ENDOMECHANICALS) ×1
BLADE CLIPPER SURG (BLADE) IMPLANT
CANISTER SUCT 3000ML PPV (MISCELLANEOUS) ×2 IMPLANT
CHLORAPREP W/TINT 26ML (MISCELLANEOUS) ×2 IMPLANT
CLIP APPLIE 5 13 M/L LIGAMAX5 (MISCELLANEOUS) ×1 IMPLANT
COVER SURGICAL LIGHT HANDLE (MISCELLANEOUS) ×2 IMPLANT
DERMABOND ADVANCED (GAUZE/BANDAGES/DRESSINGS) ×1
DERMABOND ADVANCED .7 DNX12 (GAUZE/BANDAGES/DRESSINGS) ×1 IMPLANT
ELECT REM PT RETURN 9FT ADLT (ELECTROSURGICAL) ×2
ELECTRODE REM PT RTRN 9FT ADLT (ELECTROSURGICAL) ×1 IMPLANT
GLOVE BIO SURGEON STRL SZ 6 (GLOVE) ×2 IMPLANT
GLOVE BIOGEL PI IND STRL 6.5 (GLOVE) ×1 IMPLANT
GLOVE BIOGEL PI INDICATOR 6.5 (GLOVE) ×1
GOWN STRL REUS W/ TWL LRG LVL3 (GOWN DISPOSABLE) ×3 IMPLANT
GOWN STRL REUS W/TWL LRG LVL3 (GOWN DISPOSABLE) ×6
GRASPER SUT TROCAR 14GX15 (MISCELLANEOUS) ×2 IMPLANT
KIT BASIN OR (CUSTOM PROCEDURE TRAY) ×2 IMPLANT
KIT ROOM TURNOVER OR (KITS) ×2 IMPLANT
NDL INSUFFLATION 14GA 120MM (NEEDLE) ×1 IMPLANT
NEEDLE INSUFFLATION 14GA 120MM (NEEDLE) ×2 IMPLANT
NS IRRIG 1000ML POUR BTL (IV SOLUTION) ×2 IMPLANT
PAD ARMBOARD 7.5X6 YLW CONV (MISCELLANEOUS) ×2 IMPLANT
POUCH SPECIMEN RETRIEVAL 10MM (ENDOMECHANICALS) ×2 IMPLANT
SCISSORS LAP 5X35 DISP (ENDOMECHANICALS) ×2 IMPLANT
SET IRRIG TUBING LAPAROSCOPIC (IRRIGATION / IRRIGATOR) ×2 IMPLANT
SLEEVE ENDOPATH XCEL 5M (ENDOMECHANICALS) ×4 IMPLANT
SOLUTION ANTI FOG 6CC (MISCELLANEOUS) ×1 IMPLANT
SPECIMEN JAR SMALL (MISCELLANEOUS) ×2 IMPLANT
SUT MNCRL AB 4-0 PS2 18 (SUTURE) ×2 IMPLANT
TOWEL OR 17X24 6PK STRL BLUE (TOWEL DISPOSABLE) ×2 IMPLANT
TOWEL OR 17X26 10 PK STRL BLUE (TOWEL DISPOSABLE) IMPLANT
TRAY LAPAROSCOPIC MC (CUSTOM PROCEDURE TRAY) ×2 IMPLANT
TROCAR XCEL NON-BLD 11X100MML (ENDOMECHANICALS) ×2 IMPLANT
TROCAR XCEL NON-BLD 5MMX100MML (ENDOMECHANICALS) ×2 IMPLANT
TUBING INSUFFLATION (TUBING) ×2 IMPLANT

## 2016-08-23 NOTE — Anesthesia Postprocedure Evaluation (Signed)
Anesthesia Post Note  Patient: [name]  Procedure(s) Performed: Procedure(s) (LRB): LAPAROSCOPIC CHOLECYSTECTOMY (N/A)  Patient location during evaluation: PACU Anesthesia Type: General Level of consciousness: awake and alert Pain management: pain level controlled Vital Signs Assessment: post-procedure vital signs reviewed and stable Respiratory status: spontaneous breathing Cardiovascular status: stable Postop Assessment: no signs of nausea or vomiting Anesthetic complications: no       Last Vitals:  Vitals:   08/23/16 1218 08/23/16 1231  BP: (!) 143/100 (!) 135/98  Pulse: 89 62  Resp: 13 17  Temp:      Last Pain:  Vitals:   08/23/16 0840  TempSrc: Oral                 Arma Heading

## 2016-08-23 NOTE — H&P (Signed)
[name] Patient #: [id number] DOB: 1980/11/02 Single / Language: [place] / Race: White Female  History of Present Illness  Patient words: Otherwise fairly healthy 36 year old woman who is been having bouts of right upper quadrant pain for several years, this is associated with nausea. She has not noticed whether it is provoked by eating or relieved by anything in particular. A couple weeks ago she developed severe right upper quadrant pain with associated nausea and emesis but no fevers, and was seen in the emergency department. She also reports some associated diarrhea and fatigue. The pain is always in the right upper quadrant and radiates to her back. Her labs during her emergency department visit were unremarkable and she had an upper quadrant ultrasound that showed a 7 mm stone but no signs of cholecystitis. Her common bile duct was 2 mm.    Past Surgical History Cesarean Section - Multiple  Diagnostic Studies History Mammogram within last year  Allergies Penicillins Nausea, Vomiting. AmLODIPine Besylate *CALCIUM CHANNEL BLOCKERS*  Medication History Proventil ((2.5 MG/3ML)0.083% Nebulized Soln, Inhalation) Active. Symbicort (160-4.5MCG/ACT Aerosol, Inhalation) Active. Latuda (  Tablet, Oral) Active. Flonase Allergy Relief (50MCG/ACT Suspension, Nasal) Active. Metoprolol Tartrate (  Tablet, Oral) Active. HydroCHLOROthiazide (12.5MG  Capsule, Oral) Active. Qvar (80MCG/ACT Aerosol Soln, Inhalation) Active. Medications Reconciled  Social History  Alcohol use Occasional alcohol use. Caffeine use Carbonated beverages, Coffee. No drug use Tobacco use Current every day smoker.  Family History  Alcohol Abuse Father, Mother. Cancer Family Members In General. Depression Father, Mother. Diabetes Mellitus Son. Hypertension Father. Ischemic Bowel Disease Father, Mother. Melanoma Father. Respiratory Condition Family Members In  General. Seizure disorder Mother. Thyroid problems Mother.  Pregnancy / Birth History  Age at menarche 15 years. Gravida 4 Irregular periods Maternal age 75-25 Para 2  Other Problems Anxiety Disorder Asthma Back Pain Cholelithiasis Gastroesophageal Reflux Disease High blood pressure     Review of Systems General Present- Chills, Fatigue, Fever, Night Sweats and Weight Gain. Not Present- Appetite Loss and Weight Loss. Skin Present- Dryness. Not Present- Change in Wart/Mole, Hives, Jaundice, New Lesions, Non-Healing Wounds, Rash and Ulcer. HEENT Present- Earache and Visual Disturbances. Not Present- Hearing Loss, Hoarseness, Nose Bleed, Oral Ulcers, Ringing in the Ears, Seasonal Allergies, Sinus Pain, Sore Throat, Wears glasses/contact lenses and Yellow Eyes. Respiratory Present- Chronic Cough, Difficulty Breathing and Wheezing. Not Present- Bloody sputum and Snoring. Cardiovascular Present- Chest Pain, Leg Cramps, Rapid Heart Rate and Shortness of Breath. Not Present- Difficulty Breathing Lying Down, Palpitations and Swelling of Extremities. Gastrointestinal Present- Abdominal Pain, Bloating, Chronic diarrhea, Hemorrhoids, Indigestion and Nausea. Not Present- Bloody Stool, Change in Bowel Habits, Constipation, Difficulty Swallowing, Excessive gas, Gets full quickly at meals, Rectal Pain and Vomiting. Female Genitourinary Present- Frequency, Nocturia, Painful Urination and Urgency. Not Present- Pelvic Pain. Musculoskeletal Present- Back Pain. Not Present- Joint Pain, Joint Stiffness, Muscle Pain, Muscle Weakness and Swelling of Extremities. Neurological Present- Decreased Memory and Headaches. Not Present- Fainting, Numbness, Seizures, Tingling, Tremor, Trouble walking and Weakness. Psychiatric Present- Anxiety, Change in Sleep Pattern, Depression and Frequent crying. Not Present- Bipolar and Fearful. Endocrine Present- Hot flashes. Not Present- Cold Intolerance,  Excessive Hunger, Hair Changes, Heat Intolerance and New Diabetes. Hematology Present- Easy Bruising. Not Present- Blood Thinners, Excessive bleeding, Gland problems, HIV and Persistent Infections.  Vitals:   08/23/16 0840  BP: (!) 152/86  Pulse: 71  Resp: 18  Temp: 97.7 F (36.5 C)       Physical Exam   General Note: Alert and oriented, no distress  Integumentary  Note: No lesions or rashes on limited skin exam  Head and Neck Note: No mass or thyromegaly  Eye Note: Anicteric, extra ocular motion intact  ENMT Note: Moist mucous membranes, good dentition  Chest and Lung Exam Note: Unlabored respirations, symmetrical air entry  Cardiovascular Note: Regular rate and rhythm, no pedal edema  Abdomen Note: Obese, soft, nondistended. Mildly tender in the right upper quadrant. No palpable mass or organomegaly  Neurologic Note: Grossly intact, normal gait  Neuropsychiatric Note: Normal mood and affect, appropriate insight  Musculoskeletal Note: Strength symmetrical throughout, no deformity    Assessment & Plan   BILIARY COLIC (K80.50) Story: She has classical symptoms and stones on her ultrasound. I recommended A cholecystectomy and described procedure in detail. We discussed the risks of bleeding, pain, scarring, injury to intercostal structures specifically the common bile duct and sequelae, as well as the risk of conversion to an open procedure. She asked appropriate questions which were answered. She would like to proceed with surgery.

## 2016-08-23 NOTE — Progress Notes (Signed)
Patient complaining of frequent urination and states that she has had UTI's in the past.  Dr. Fredricka Bonine notified and ordered urinalysis.

## 2016-08-23 NOTE — Anesthesia Preprocedure Evaluation (Signed)
Anesthesia Evaluation   Patient awake    Reviewed: Allergy & Precautions, NPO status , Patient's Chart, lab work & pertinent test results, reviewed documented beta blocker date and time   Airway Mallampati: II  TM Distance: >3 FB Neck ROM: Full    Dental  (+) Teeth Intact, Dental Advisory Given   Pulmonary asthma , Current Smoker,    breath sounds clear to auscultation       Cardiovascular hypertension,  Rhythm:Regular Rate:Normal     Neuro/Psych  Headaches,    GI/Hepatic   Endo/Other    Renal/GU      Musculoskeletal   Abdominal   Peds  Hematology   Anesthesia Other Findings   Reproductive/Obstetrics                             Anesthesia Physical Anesthesia Plan  ASA: II  Anesthesia Plan: General   Post-op Pain Management:    Induction: Intravenous  Airway Management Planned: Oral ETT  Additional Equipment:   Intra-op Plan:   Post-operative Plan: Extubation in OR  Informed Consent: I have reviewed the patients History and Physical, chart, labs and discussed the procedure including the risks, benefits and alternatives for the proposed anesthesia with the patient or authorized representative who has indicated his/her understanding and acceptance.   Dental advisory given  Plan Discussed with: CRNA  Anesthesia Plan Comments: (HCG neg 08/16/16, pt states menstruasting now.)        Anesthesia Quick Evaluation

## 2016-08-23 NOTE — Op Note (Signed)
Operative Note  [name] 36 y.o. female [id number]  08/23/2016  Surgeon: Berna Bue   Assistant: OR staff  Procedure performed: Laparoscopic Cholecystectomy  Preop diagnosis: biliary colic Post-op diagnosis/intraop findings: same  Specimens: gallbladder  EBL: 20cc Complications: none  Description of procedure: After obtaining informed consent the patient was brought to the operating room. Prophylactic antibiotics and subcutaneous heparin were administered. SCD's were applied. General endotracheal anesthesia was initiated and a formal time-out was performed. The abdomen was prepped and draped in the usual sterile fashion and the abdomen was entered using an infraumbilical veress needle after instilling the site with local. Insufflation to was obtained, 5mm trocar and camera introduced and gross inspection revealed no evidence of injury from our entry or other intraabdominal abnormalities. Two 5mm trocars were introduced in the right midclavicular and right anterior axillary lines under direct visualization and following infiltration with local. An 11mm trocar was placed in the epigastrium. The gallbladder was retracted cephalad and the infundibulum was retracted laterally. There were omental adhesions to the body and fundus of the gallbladder which were divided with cautery. A combination of hook electrocautery and blunt dissection was utilized to clear the peritoneum from the neck and cystic duct, circumferentially isolating the cystic artery and cystic duct and lifting the gallbladder from the cystic plate. The critical view of safety was achieved with the cystic artery, cystic duct, and liver bed visualized between them with no other structures. The artery was clipped with a single clip proximally and distally and divided as was the cystic duct with two clips on the proximal end. The gallbladder was dissected from the liver plate using electrocautery. Once freed the  gallbladder was placed in an endocatch bag and removed intact through the epigastric trocar site. A small amount of bleeding on the liver bed was controlled with cautery.  This was aspirated and the right upper quadrant was irrigated and the effluent was clear. Hemostasis was once again confirmed, and reinspection of the abdomen revealed no injuries. The clips were well opposed without any bile leak from the duct or the liver bed. The 11mm trocar site in the epigastrium was closed with a 0 vicryl in the fascia under direct visualization using a PMI device. The abdomen was desufflated and all trocars removed. The skin incisions were closed with running subcuticular monocryl and Dermabond. The patient was awakened, extubated and transported to the recovery room in stable condition.   All counts were correct at the completion of the case.

## 2016-08-23 NOTE — Transfer of Care (Signed)
Immediate Anesthesia Transfer of Care Note  Patient: [name]  Procedure(s) Performed: Procedure(s): LAPAROSCOPIC CHOLECYSTECTOMY (N/A)  Patient Location: PACU  Anesthesia Type:General  Level of Consciousness: awake, alert  and patient cooperative  Airway & Oxygen Therapy: Patient Spontanous Breathing and Patient connected to face mask oxygen  Post-op Assessment: Report given to RN, Post -op Vital signs reviewed and stable, Patient moving all extremities X 4 and Patient able to stick tongue midline  Post vital signs: Reviewed and stable  Last Vitals:  Vitals:   08/23/16 0840 08/23/16 1215  BP: (!) 152/86   Pulse: 71 87  Resp: 18 (!) 21  Temp: 36.5 C 36.7 C    Last Pain:  Vitals:   08/23/16 0840  TempSrc: Oral         Complications: No apparent anesthesia complications

## 2016-08-23 NOTE — Anesthesia Procedure Notes (Signed)
Procedure Name: Intubation Date/Time: 08/23/2016 11:10 AM Performed by: Jolly Mango Pre-anesthesia Checklist: Patient identified, Emergency Drugs available, Suction available, Patient being monitored and Timeout performed Patient Re-evaluated:Patient Re-evaluated prior to inductionOxygen Delivery Method: Circle system utilized Preoxygenation: Pre-oxygenation with 100% oxygen Intubation Type: IV induction Ventilation: Mask ventilation without difficulty Laryngoscope Size: Mac and 3 Grade View: Grade I Tube type: Oral Tube size: 7.0 mm Number of attempts: 1 Airway Equipment and Method: Stylet Placement Confirmation: ETT inserted through vocal cords under direct vision,  positive ETCO2,  CO2 detector and breath sounds checked- equal and bilateral Secured at: 21 cm Tube secured with: Tape Dental Injury: Teeth and Oropharynx as per pre-operative assessment

## 2016-08-23 NOTE — Discharge Instructions (Signed)
[facility] ______[facility] °LAPAROSCOPIC SURGERY: POST OP INSTRUCTIONS °Always review your discharge instruction sheet given to you by the facility where your surgery was performed. °IF YOU HAVE DISABILITY OR FAMILY LEAVE FORMS, YOU MUST BRING THEM TO THE OFFICE FOR PROCESSING.   °DO NOT GIVE THEM TO YOUR DOCTOR. ° °1. A prescription for pain medication may be given to you upon discharge.  Take your pain medication as prescribed, if needed.  If narcotic pain medicine is not needed, then you may take acetaminophen (Tylenol) or ibuprofen (Advil) as needed. °2. Take your usually prescribed medications unless otherwise directed. °3. If you need a refill on your pain medication, please contact your pharmacy.  They will contact our office to request authorization. Prescriptions will not be filled after 5pm or on week-ends. °4. You should follow a light diet the first few days after arrival home, such as soup and crackers, etc.  Be sure to include lots of fluids daily. °5. Most patients will experience some swelling and bruising in the area of the incisions.  Ice packs will help.  Swelling and bruising can take several days to resolve.  °6. It is common to experience some constipation if taking pain medication after surgery.  Increasing fluid intake and taking a stool softener (such as Colace) will usually help or prevent this problem from occurring.  A mild laxative (Milk of Magnesia or Miralax) should be taken according to package instructions if there are no bowel movements after 48 hours. °7. Unless discharge instructions indicate otherwise, you may remove your bandages 24-48 hours after surgery, and you may shower at that time.  You may have steri-strips (small skin tapes) in place directly over the incision.  These strips should be left on the skin for 7-10 days.  If your surgeon used skin glue on the incision, you may shower in 24 hours.  The glue will flake off over the next 2-3 weeks.  Any sutures or  staples will be removed at the office during your follow-up visit. °8. ACTIVITIES:  You may resume regular (light) daily activities beginning the next day--such as daily self-care, walking, climbing stairs--gradually increasing activities as tolerated.  You may have sexual intercourse when it is comfortable.  Refrain from any heavy lifting or straining until approved by your doctor. °a. You may drive when you are no longer taking prescription pain medication, you can comfortably wear a seatbelt, and you can safely maneuver your car and apply brakes. °b. RETURN TO WORK:  __________________________________________________________ °9. You should see your doctor in the office for a follow-up appointment approximately 2-3 weeks after your surgery.  Make sure that you call for this appointment within a day or two after you arrive home to insure a convenient appointment time. °10. OTHER INSTRUCTIONS: __________________________________________________________________________________________________________________________ __________________________________________________________________________________________________________________________ °WHEN TO CALL YOUR DOCTOR: °1. Fever over 101.0 °2. Inability to urinate °3. Continued bleeding from incision. °4. Increased pain, redness, or drainage from the incision. °5. Increasing abdominal pain ° °The clinic staff is available to answer your questions during regular business hours.  Please don’t hesitate to call and ask to speak to one of the nurses for clinical concerns.  If you have a medical emergency, go to the nearest emergency room or call 911.  A surgeon from [facility] is always on call at the hospital. °[address], [address], [place], [place]  [address] ? [address] [address], [place], [place]   [address] °[phone] ? [phone] ? FAX [phone] °Web site:   [email or web address] °

## 2016-08-24 ENCOUNTER — Encounter (HOSPITAL_COMMUNITY): Payer: Self-pay | Admitting: Surgery

## 2016-08-27 ENCOUNTER — Ambulatory Visit: Payer: Self-pay | Admitting: Internal Medicine

## 2016-10-03 ENCOUNTER — Encounter: Payer: Self-pay | Admitting: Internal Medicine

## 2016-10-29 ENCOUNTER — Encounter (HOSPITAL_COMMUNITY): Payer: Self-pay | Admitting: *Deleted

## 2016-11-07 ENCOUNTER — Other Ambulatory Visit: Payer: Self-pay | Admitting: Physician Assistant

## 2016-11-07 DIAGNOSIS — I10 Essential (primary) hypertension: Secondary | ICD-10-CM

## 2016-11-30 ENCOUNTER — Other Ambulatory Visit: Payer: Self-pay | Admitting: Physician Assistant

## 2016-11-30 DIAGNOSIS — I10 Essential (primary) hypertension: Secondary | ICD-10-CM

## 2016-12-11 ENCOUNTER — Emergency Department (HOSPITAL_COMMUNITY): Payer: Medicaid Other

## 2016-12-11 ENCOUNTER — Telehealth: Payer: Self-pay | Admitting: Family Medicine

## 2016-12-11 ENCOUNTER — Emergency Department (HOSPITAL_COMMUNITY)
Admission: EM | Admit: 2016-12-11 | Discharge: 2016-12-11 | Disposition: A | Discharge status: Home / Self Care | Payer: Medicaid Other | Attending: Emergency Medicine | Admitting: Emergency Medicine

## 2016-12-11 DIAGNOSIS — Z79899 Other long term (current) drug therapy: Secondary | ICD-10-CM | POA: Diagnosis not present

## 2016-12-11 DIAGNOSIS — R51 Headache: Secondary | ICD-10-CM | POA: Diagnosis not present

## 2016-12-11 DIAGNOSIS — J45909 Unspecified asthma, uncomplicated: Secondary | ICD-10-CM | POA: Diagnosis not present

## 2016-12-11 DIAGNOSIS — R0789 Other chest pain: Secondary | ICD-10-CM | POA: Diagnosis present

## 2016-12-11 DIAGNOSIS — I10 Essential (primary) hypertension: Secondary | ICD-10-CM | POA: Diagnosis not present

## 2016-12-11 DIAGNOSIS — F17210 Nicotine dependence, cigarettes, uncomplicated: Secondary | ICD-10-CM | POA: Insufficient documentation

## 2016-12-11 DIAGNOSIS — Z87442 Personal history of urinary calculi: Secondary | ICD-10-CM | POA: Diagnosis not present

## 2016-12-11 DIAGNOSIS — R519 Headache, unspecified: Secondary | ICD-10-CM

## 2016-12-11 LAB — BASIC METABOLIC PANEL
Anion gap: 10 (ref 5–15)
BUN: 11 mg/dL (ref 6–20)
CHLORIDE: 105 mmol/L (ref 101–111)
CO2: 25 mmol/L (ref 22–32)
CREATININE: 1.04 mg/dL — AB (ref 0.44–1.00)
Calcium: 9.7 mg/dL (ref 8.9–10.3)
GFR calc Af Amer: >60 mL/min (ref >60)
GFR calc non Af Amer: >60 mL/min (ref >60)
Glucose, Bld: 90 mg/dL (ref 65–99)
POTASSIUM: 4.2 mmol/L (ref 3.5–5.1)
SODIUM: 140 mmol/L (ref 135–145)

## 2016-12-11 LAB — CBC
HEMATOCRIT: 44 % (ref 36.0–46.0)
Hemoglobin: 15 g/dL (ref 12.0–15.0)
MCH: 31.3 pg (ref 26.0–34.0)
MCHC: 34.1 g/dL (ref 30.0–36.0)
MCV: 91.7 fL (ref 78.0–100.0)
PLATELETS: 323 10*3/uL (ref 150–400)
RBC: 4.8 MIL/uL (ref 3.87–5.11)
RDW: 13.7 % (ref 11.5–15.5)
WBC: 8.5 10*3/uL (ref 4.0–10.5)

## 2016-12-11 LAB — I-STAT TROPONIN, ED: Troponin i, poc: 0.01 ng/mL (ref 0.00–0.08)

## 2016-12-11 MED ORDER — KETOROLAC TROMETHAMINE 30 MG/ML IJ SOLN
15.0000 mg | Freq: Once | INTRAMUSCULAR | Status: AC
  Administered 2016-12-11: 15 mg via INTRAVENOUS
  Filled 2016-12-11: qty 1

## 2016-12-11 MED ORDER — SODIUM CHLORIDE 0.9 % IV BOLUS (SEPSIS)
500.0000 mL | Freq: Once | INTRAVENOUS | Status: AC
  Administered 2016-12-11: 500 mL via INTRAVENOUS

## 2016-12-11 MED ORDER — PROCHLORPERAZINE EDISYLATE 5 MG/ML IJ SOLN
5.0000 mg | Freq: Once | INTRAMUSCULAR | Status: AC
  Administered 2016-12-11: 5 mg via INTRAVENOUS
  Filled 2016-12-11: qty 2

## 2016-12-11 MED ORDER — HYDROCHLOROTHIAZIDE 12.5 MG PO CAPS
12.5000 mg | ORAL_CAPSULE | Freq: Once | ORAL | Status: AC
  Administered 2016-12-11: 12.5 mg via ORAL
  Filled 2016-12-11: qty 1

## 2016-12-11 MED ORDER — DIPHENHYDRAMINE HCL 50 MG/ML IJ SOLN
12.5000 mg | Freq: Once | INTRAMUSCULAR | Status: AC
  Administered 2016-12-11: 12.5 mg via INTRAVENOUS
  Filled 2016-12-11: qty 1

## 2016-12-11 NOTE — Telephone Encounter (Signed)
Pt states for the last couple of days, she feels that her blood pressure is up. She check BP today and reported reading 175/104. She states she hasn't taken her BP medications in 2 days and was told by the pharmacist that she needed an office visit. Pt advised that she has an OV on tomorrow. She wanted to come in today. Asked patient If she was able to check another blood pressure. Her response was, "She is not at home, presently at appointment with her son".  Pt informed that no appointments are available today. She states she has had some chest pain. Suggested that due chest pain she should go to ED.

## 2016-12-11 NOTE — ED Provider Notes (Signed)
[facility]-EMERGENCY DEPT Provider Note   CSN: [id number] Arrival date & time: 12/11/16  1605     History   Chief Complaint Chief Complaint  Patient presents with  . Chest Pain    HPI [name] is a 36 y.o. female who Presents for hypertension and headache. Patient states that she has a history of preeclampsia with both of her pregnancies along with hypertension. She takes metoprolol and hydrochlorothiazide. She's been out of her HCTZ for the past 5 days. Patient states she has a follow-up with her PCP tomorrow. She is complaining of a global throbbing headache. She took a Goody's powder last night without relief of her symptoms. She states that today she started having a little bit of intermittent chest pain  which she describes as retrosternal, intermittent, she denies shortness of breath, nausea, diaphoresis or radiation to her arm or jaw. Although she does have hypertension. She denies a family history of early MI, smoking, high cholesterol. She denies hemoptysis, unilateral leg swelling, use of exogenous estrogens. Denies photophobia, phonophobia, UL throbbing, N/V, visual changes, stiff neck, neck pain, rash, or "thunderclap" onset.     HPI  Past Medical History:  Diagnosis Date  . Anxiety   . Asthma   . Depression    doing good, not on meds now  . GERD (gastroesophageal reflux disease)   . Headache    migraines  . History of kidney stones   . Hypertension   . Kidney stones   . Missed abortion 10/02/2013  . Ovarian cyst   . Pregnancy induced hypertension   . Vaginal Pap smear, abnormal    bx, f/u ok    Patient Active Problem List   Diagnosis Date Noted  . Postpartum complication 06/13/2015  . Normal labor and delivery 06/06/2015  . Indication for care in labor or delivery 06/06/2015  . S/P cesarean section 06/06/2015  . Asthma, chronic 07/29/2014  . Depression 07/29/2014  . Smoking 07/29/2014  . Essential hypertension, benign 07/29/2014  . Missed abortion  10/02/2013    Past Surgical History:  Procedure Laterality Date  . CESAREAN SECTION    . CESAREAN SECTION N/A 06/06/2015   Procedure: CESAREAN SECTION;  Surgeon: Kathreen Cosier, MD;  Location: [facility];  Service: Obstetrics;  Laterality: N/A;  . CHOLECYSTECTOMY N/A 08/23/2016   Procedure: LAPAROSCOPIC CHOLECYSTECTOMY;  Surgeon: Berna Bue, MD;  Location: [facility];  Service: General;  Laterality: N/A;  . DILATION AND CURETTAGE OF UTERUS    . DILATION AND EVACUATION N/A 10/04/2013   Procedure: DILATATION AND EVACUATION;  Surgeon: Sherian Rein, MD;  Location: [facility];  Service: Gynecology;  Laterality: N/A;  [place] in room   . TONSILLECTOMY    . TUBAL LIGATION      OB History    Gravida Para Term Preterm AB Living   4 2 2  0 2 2   SAB TAB Ectopic Multiple Live Births   1 1 0 0 2       Home Medications    Prior to Admission medications   Medication Sig Start Date End Date Taking? Authorizing Provider  albuterol (PROVENTIL HFA;VENTOLIN HFA) 108 (90 Base) MCG/ACT inhaler Inhale 1-2 puffs into the lungs every 6 (six) hours as needed for wheezing or shortness of breath.   Yes [provider]  Brexpiprazole (REXULTI) 2 MG TABS Take 1 tablet by mouth at bedtime.   Yes [provider]  budesonide-formoterol (SYMBICORT) 160-4.5 MCG/ACT inhaler Inhale 2 puffs into the lungs 2 (two) times  daily. Patient taking differently: Inhale 2 puffs into the lungs daily.  01/18/16  Yes Parrett, Tammy S, NP  hydrochlorothiazide (MICROZIDE) 12.5 MG capsule TAKE 2 CAPSULES DAILY 11/07/16  Yes McClung, Angela M, PA-C  ibuprofen (ADVIL,MOTRIN) 200 MG tablet Take 800 mg by mouth every 8 (eight) hours as needed (for pain/headache.).   Yes [provider]  metoprolol tartrate (LOPRESSOR) 25 MG tablet Take 1 tablet (25 mg total) by mouth 2 (two) times daily. 04/30/16  Yes Arrie SenateHairston, Mandesia R, FNP  oxyCODONE-acetaminophen (PERCOCET/ROXICET) 5-325 MG tablet Take 1 tablet by mouth every 6  (six) hours as needed for severe pain. Patient not taking: Reported on 12/11/2016 08/23/16   Berna Bueonnor, Chelsea A, MD    Family History Family History  Problem Relation Age of Onset  . Hearing loss Mother   . Asthma Mother   . Asthma Father   . Diabetes Son   . Cancer Maternal Grandfather        bone  . Liver disease Maternal Grandfather   . Asthma Sister   . Lupus Sister     Social History Social History  Substance Use Topics  . Smoking status: Current Some Day Smoker    Packs/day: 0.25    Years: 15.00    Types: Cigarettes  . Smokeless tobacco: Never Used     Comment: Smoking .5 ppd  . Alcohol use 2.4 oz/week    4 Cans of beer per week     Comment: very occassionally     Allergies   Penicillins and Norvasc [amlodipine besylate]   Review of Systems Review of Systems  Ten systems reviewed and are negative for acute change, except as noted in the HPI.   Physical Exam Updated Vital Signs BP (!) 152/108 (BP Location: Left Arm)   Pulse 62   Temp 98.1 F (36.7 C) (Oral)   Resp 14   Ht 5\' 4"  (1.626 m)   Wt 82.1 kg (181 lb)   SpO2 98%   BMI 31.07 kg/m   Physical Exam  Constitutional: She is oriented to person, place, and time. She appears well-developed and well-nourished. No distress.  HENT:  Head: Normocephalic and atraumatic.  Mouth/Throat: Oropharynx is clear and moist.  Eyes: Pupils are equal, round, and reactive to light. Conjunctivae and EOM are normal. No scleral icterus.  No horizontal, vertical or rotational nystagmus  Neck: Normal range of motion. Neck supple.  Full active and passive ROM without pain No midline or paraspinal tenderness No nuchal rigidity or meningeal signs  Cardiovascular: Normal rate, regular rhythm and intact distal pulses.   Pulmonary/Chest: Effort normal and breath sounds normal. No respiratory distress. She has no wheezes. She has no rales.  Abdominal: Soft. Bowel sounds are normal. There is no tenderness. There is no rebound and  no guarding.  Musculoskeletal: Normal range of motion.  Lymphadenopathy:    She has no cervical adenopathy.  Neurological: She is alert and oriented to person, place, and time. No cranial nerve deficit. She exhibits normal muscle tone. Coordination normal.  Mental Status:  Alert, oriented, thought content appropriate. Speech fluent without evidence of aphasia. Able to follow 2 step commands without difficulty.  Cranial Nerves:  II:  Peripheral visual fields grossly normal, pupils equal, round, reactive to light III,IV, VI: ptosis not present, extra-ocular motions intact bilaterally  V,VII: smile symmetric, facial light touch sensation equal VIII: hearing grossly normal bilaterally  IX,X: midline uvula rise  XI: bilateral shoulder shrug equal and strong XII: midline tongue extension  Motor:  5/5 in upper and lower extremities bilaterally including strong and equal grip strength and dorsiflexion/plantar flexion Sensory: Pinprick and light touch normal in all extremities.  Cerebellar: normal finger-to-nose with bilateral upper extremities Gait: normal gait and balance CV: distal pulses palpable throughout   Skin: Skin is warm and dry. No rash noted. She is not diaphoretic.  Psychiatric: She has a normal mood and affect. Her behavior is normal. Judgment and thought content normal.  Nursing note and vitals reviewed.    ED Treatments / Results  Labs (all labs ordered are listed, but only abnormal results are displayed) Labs Reviewed  BASIC METABOLIC PANEL - Abnormal; Notable for the following:       Result Value   Creatinine, Ser 1.04 (*)    All other components within normal limits  CBC  I-STAT TROPONIN, ED    EKG  EKG Interpretation None       Radiology Dg Chest 2 View  Result Date: 12/11/2016 CLINICAL DATA:  Acute chest pain today. EXAM: CHEST  2 VIEW COMPARISON:  02/16/2016 and prior radiograph FINDINGS: The cardiomediastinal silhouette is unremarkable. There is no  evidence of focal airspace disease, pulmonary edema, suspicious pulmonary nodule/mass, pleural effusion, or pneumothorax. No acute bony abnormalities are identified. IMPRESSION: No active cardiopulmonary disease. Electronically Signed   By: Harmon PierJeffrey  Hu M.D.   On: 12/11/2016 17:17    Procedures Procedures (including critical care time)  Medications Ordered in ED Medications  hydrochlorothiazide (MICROZIDE) capsule 12.5 mg (not administered)  sodium chloride 0.9 % bolus 500 mL (not administered)  prochlorperazine (COMPAZINE) injection 5 mg (not administered)  diphenhydrAMINE (BENADRYL) injection 12.5 mg (not administered)  ketorolac (TORADOL) 30 MG/ML injection 15 mg (not administered)     Initial Impression / Assessment and Plan / ED Course  I have reviewed the triage vital signs and the nursing notes.  Pertinent labs & imaging results that were available during my care of the patient were reviewed by me and considered in my medical decision making (see chart for details).     10:58 PM BP is now 134/90 Pt HA treated and improved while in ED.  Presentation is not concerning for [facility], ICH, Meningitis, or temporal arteritis. Pt is afebrile with no focal neuro deficits, nuchal rigidity, or change in vision. Pt is to follow up with PCP to discuss prophylactic medication. Pt verbalizes understanding and is agreeable with plan to dc.    Final Clinical Impressions(s) / ED Diagnoses   Final diagnoses:  Bad headache  Hypertension, unspecified type    New Prescriptions New Prescriptions   No medications on file     Arthor CaptainHarris, Michal Strzelecki, PA-C 12/11/16 2259    Nira Connardama, Pedro Eduardo, MD 12/12/16 [phone]

## 2016-12-11 NOTE — ED Triage Notes (Signed)
Pt states that she has had HTN, chest pain and headache x 2 days. Alert and oriented. Neuro intact.

## 2016-12-11 NOTE — Discharge Instructions (Signed)

## 2016-12-11 NOTE — Telephone Encounter (Signed)
Pt is concern about her Bp is 175/104 she haven't take her hydrochlorothiazide  Per 2 days and she took metoprolol and her depression medicine rezolti today and she has an appointment tomorrow  Please, call her thank you

## 2016-12-12 ENCOUNTER — Ambulatory Visit: Payer: Medicaid Other | Attending: Internal Medicine | Admitting: Physician Assistant

## 2016-12-12 VITALS — BP 127/85 | HR 89 | Temp 98.2°F | Resp 18 | Ht 64.0 in | Wt 191.0 lb

## 2016-12-12 DIAGNOSIS — F419 Anxiety disorder, unspecified: Secondary | ICD-10-CM | POA: Diagnosis not present

## 2016-12-12 DIAGNOSIS — R51 Headache: Secondary | ICD-10-CM

## 2016-12-12 DIAGNOSIS — J45909 Unspecified asthma, uncomplicated: Secondary | ICD-10-CM | POA: Insufficient documentation

## 2016-12-12 DIAGNOSIS — IMO0001 Reserved for inherently not codable concepts without codable children: Secondary | ICD-10-CM

## 2016-12-12 DIAGNOSIS — K219 Gastro-esophageal reflux disease without esophagitis: Secondary | ICD-10-CM | POA: Insufficient documentation

## 2016-12-12 DIAGNOSIS — N83209 Unspecified ovarian cyst, unspecified side: Secondary | ICD-10-CM | POA: Diagnosis not present

## 2016-12-12 DIAGNOSIS — R519 Headache, unspecified: Secondary | ICD-10-CM

## 2016-12-12 DIAGNOSIS — J069 Acute upper respiratory infection, unspecified: Secondary | ICD-10-CM | POA: Insufficient documentation

## 2016-12-12 DIAGNOSIS — R0981 Nasal congestion: Secondary | ICD-10-CM

## 2016-12-12 DIAGNOSIS — F329 Major depressive disorder, single episode, unspecified: Secondary | ICD-10-CM | POA: Diagnosis not present

## 2016-12-12 DIAGNOSIS — Z79899 Other long term (current) drug therapy: Secondary | ICD-10-CM | POA: Diagnosis not present

## 2016-12-12 DIAGNOSIS — I10 Essential (primary) hypertension: Secondary | ICD-10-CM

## 2016-12-12 DIAGNOSIS — Z880 Allergy status to penicillin: Secondary | ICD-10-CM | POA: Insufficient documentation

## 2016-12-12 DIAGNOSIS — F17200 Nicotine dependence, unspecified, uncomplicated: Secondary | ICD-10-CM | POA: Diagnosis not present

## 2016-12-12 DIAGNOSIS — Z888 Allergy status to other drugs, medicaments and biological substances status: Secondary | ICD-10-CM | POA: Diagnosis not present

## 2016-12-12 DIAGNOSIS — Z87442 Personal history of urinary calculi: Secondary | ICD-10-CM | POA: Insufficient documentation

## 2016-12-12 MED ORDER — METOPROLOL TARTRATE 25 MG PO TABS: 25.0000 mg | ORAL_TABLET | Freq: Two times a day (BID) | ORAL | 3 refills | Status: DC

## 2016-12-12 MED ORDER — FLUTICASONE PROPIONATE 50 MCG/ACT NA SUSP: 2.0000 | Freq: Every day | NASAL | 6 refills | Status: DC

## 2016-12-12 MED ORDER — HYDROCHLOROTHIAZIDE 25 MG PO TABS: 25.0000 mg | ORAL_TABLET | Freq: Every day | ORAL | 3 refills | Status: DC

## 2016-12-12 NOTE — Progress Notes (Signed)
Patient ID: [name], female   DOB: June 05, 1980, 36 y.o.   MRN: [id number]     Melissa Leighllenjane Brining, is a 36 y.o. female  WUJ:[id number]  [id number]  DOB - June 05, 1980  Subjective:  Chief Complaint and HPI: [name] is a 36 y.o. female here today for a follow up visit After being seen in the ED yesterday for HA and htn.  She was out of one of her BP meds.  She also c/o intermittent CP and had negative EKG and cardiac enzymes.  She was treated with compazine, benadryl, and toradol and told to f/ up here today(she already had an appt for today prior to her ED visit.)  BMP and CBC essentially unremarkable.   Her BP meds were not RF.  She is still out of HCTZ and almost out of metoprolol.  She is c/o nasal congestion, ST, cough for 2-3 days.  HA is better than yesterday and now managed with ibuprofen.  No N/V/D.  Mucus is clear.  Everyone in her household has same symptoms.  She denies any CP/SOB today.  She is still smoking and not ready to quit.  ED/Hospital notes reviewed.    ROS:   Constitutional:  No f/c, No night sweats, No unexplained weight loss. EENT:  No vision changes, No blurry vision, No hearing changes. + mouth, throat, or ear/nose congestion as above.  Respiratory: + cough, No SOB Cardiac: No CP, no palpitations GI:  No abd pain, No N/V/D. GU: No Urinary s/sx Musculoskeletal: No joint pain Neuro: No headache, no dizziness, no motor weakness.  Skin: No rash Endocrine:  No polydipsia. No polyuria.  Psych: Denies SI/HI  No problems updated.  ALLERGIES: Allergies  Allergen Reactions  . Penicillins Nausea And Vomiting and Other (See Comments)    UNSPECIFIED REACTION  Has patient had a PCN reaction causing immediate rash, facial/tongue/throat swelling, SOB or lightheadedness with hypotension:No Has patient had a PCN reaction causing severe rash involving mucus membranes or skin necrosis:No Has patient had a PCN reaction that REACTION THAT REQUIRED  HOSPITALIZATION:  #  #  #  YES  #  #  #  Has patient had a PCN reaction occurring within the last 10 years: #  #  #  YES  #  #  #   . Norvasc [Amlodipine Besylate] Other (See Comments)    Hypersensitivity? Tip of tongue numb and flushing.    PAST MEDICAL HISTORY: Past Medical History:  Diagnosis Date  . Anxiety   . Asthma   . Depression    doing good, not on meds now  . GERD (gastroesophageal reflux disease)   . Headache    migraines  . History of kidney stones   . Hypertension   . Kidney stones   . Missed abortion 10/02/2013  . Ovarian cyst   . Pregnancy induced hypertension   . Vaginal Pap smear, abnormal    bx, f/u ok    MEDICATIONS AT HOME: Prior to Admission medications   Medication Sig Start Date End Date Taking? Authorizing Provider  albuterol (PROVENTIL HFA;VENTOLIN HFA) 108 (90 Base) MCG/ACT inhaler Inhale 1-2 puffs into the lungs every 6 (six) hours as needed for wheezing or shortness of breath.   Yes [provider]  Brexpiprazole (REXULTI) 2 MG TABS Take 1 tablet by mouth at bedtime.   Yes [provider]  budesonide-formoterol (SYMBICORT) 160-4.5 MCG/ACT inhaler Inhale 2 puffs into the lungs 2 (two) times daily. Patient taking differently: Inhale 2 puffs into the  lungs daily.  01/18/16  Yes Parrett, Tammy S, NP  ibuprofen (ADVIL,MOTRIN) 200 MG tablet Take 800 mg by mouth every 8 (eight) hours as needed (for pain/headache.).   Yes [provider]  metoprolol tartrate (LOPRESSOR) 25 MG tablet Take 1 tablet (25 mg total) by mouth 2 (two) times daily. 12/12/16  Yes Kissa Campoy M, PA-C  fluticasone (FLONASE) 50 MCG/ACT nasal spray Place 2 sprays into both nostrils daily. 12/12/16   Anders SimmondsMcClung, Jaynie Hitch M, PA-C  hydrochlorothiazide (HYDRODIURIL) 25 MG tablet Take 1 tablet (25 mg total) by mouth daily. 12/12/16   Anders SimmondsMcClung, Huel Centola M, PA-C     Objective:  EXAM:   Vitals:   12/12/16 1037  BP: 127/85  Pulse: 89  Resp: 18  Temp: 98.2 F (36.8 C)    TempSrc: Oral  SpO2: 98%  Weight: 191 lb (86.6 kg)  Height: 5\' 4"  (1.626 m)    General appearance : A&OX3. NAD. Non-toxic-appearing HEENT: Atraumatic and Normocephalic.  PERRLA. EOM intact.  TM congested B. Mouth-MMM, post pharynx WNL w/ mild erythema and PND. Neck: supple, no JVD. No cervical lymphadenopathy. No thyromegaly Chest/Lungs:  Breathing-non-labored, Good air entry bilaterally, breath sounds normal without rales, rhonchi, or wheezing  CVS: S1 S2 regular, no murmurs, gallops, rubs  Extremities: Bilateral Lower Ext shows no edema, both legs are warm to touch with = pulse throughout Neurology:  CN II-XII grossly intact, Non focal.   Psych:  TP linear. J/I WNL. Normal speech. Appropriate eye contact and affect.  Skin:  No Rash  Data Review Lab Results  Component Value Date   HGBA1C 5.2 07/29/2014     Assessment & Plan   1. Essential hypertension, benign Better today than yesterday Check blood pressure 2 times weekly and record and bring to next visit. resume- hydrochlorothiazide (HYDRODIURIL) 25 MG tablet; Take 1 tablet (25 mg total) by mouth daily.  Dispense: 90 tablet; Refill: 3 continue- metoprolol tartrate (LOPRESSOR) 25 MG tablet; Take 1 tablet (25 mg total) by mouth 2 (two) times daily.  Dispense: 180 tablet; Refill: 3  2. Nonintractable headache, unspecified chronicity pattern, unspecified headache type Resolved this am  3. Nasal congestion with URI - fluticasone (FLONASE) 50 MCG/ACT nasal spray; Place 2 sprays into both nostrils daily.  Dispense: 16 g; Refill: 6  4. Smoking Cessation advised and materials provided.   Patient have been counseled extensively about nutrition and exercise  Return in about 6 weeks (around 01/23/2017) for assign new PCP; htn.  The patient was given clear instructions to go to ER or return to medical center if symptoms don't improve, worsen or new problems develop. The patient verbalized understanding. The patient was told to  call to get lab results if they haven't heard anything in the next week.     Georgian CoAngela Allysa Governale, PA-C [facility] [place], [place] [phone]   12/12/2016, 11:16 AM

## 2016-12-12 NOTE — Patient Instructions (Addendum)
Check blood pressure 2 times weekly and record and bring to next visit.  Steps to Quit Smoking Smoking tobacco can be harmful to your health and can affect almost every organ in your body. Smoking puts you, and those around you, at risk for developing many serious chronic diseases. Quitting smoking is difficult, but it is one of the best things that you can do for your health. It is never too late to quit. What are the benefits of quitting smoking? When you quit smoking, you lower your risk of developing serious diseases and conditions, such as:  Lung cancer or lung disease, such as COPD.  Heart disease.  Stroke.  Heart attack.  Infertility.  Osteoporosis and bone fractures.  Additionally, symptoms such as coughing, wheezing, and shortness of breath may get better when you quit. You may also find that you get sick less often because your body is stronger at fighting off colds and infections. If you are pregnant, quitting smoking can help to reduce your chances of having a baby of low birth weight. How do I get ready to quit? When you decide to quit smoking, create a plan to make sure that you are successful. Before you quit:  Pick a date to quit. Set a date within the next two weeks to give you time to prepare.  Write down the reasons why you are quitting. Keep this list in places where you will see it often, such as on your bathroom mirror or in your car or wallet.  Identify the people, places, things, and activities that make you want to smoke (triggers) and avoid them. Make sure to take these actions: ? Throw away all cigarettes at home, at work, and in your car. ? Throw away smoking accessories, such as Set designerashtrays and lighters. ? Clean your car and make sure to empty the ashtray. ? Clean your home, including curtains and carpets.  Tell your family, friends, and coworkers that you are quitting. Support from your loved ones can make quitting easier.  Talk with your health care  provider about your options for quitting smoking.  Find out what treatment options are covered by your health insurance.  What strategies can I use to quit smoking? Talk with your healthcare provider about different strategies to quit smoking. Some strategies include:  Quitting smoking altogether instead of gradually lessening how much you smoke over a period of time. Research shows that quitting "cold [place]key" is more successful than gradually quitting.  Attending in-person counseling to help you build problem-solving skills. You are more likely to have success in quitting if you attend several counseling sessions. Even short sessions of 10 minutes can be effective.  Finding resources and support systems that can help you to quit smoking and remain smoke-free after you quit. These resources are most helpful when you use them often. They can include: ? Online chats with a Veterinary surgeoncounselor. ? Telephone quitlines. ? Automotive engineerrinted self-help materials. ? Support groups or group counseling. ? Text messaging programs. ? Mobile phone applications.  Taking medicines to help you quit smoking. (If you are pregnant or breastfeeding, talk with your health care provider first.) Some medicines contain nicotine and some do not. Both types of medicines help with cravings, but the medicines that include nicotine help to relieve withdrawal symptoms. Your health care provider may recommend: ? Nicotine patches, gum, or lozenges. ? Nicotine inhalers or sprays. ? Non-nicotine medicine that is taken by mouth.  Talk with your health care provider about combining strategies, such as  taking medicines while you are also receiving in-person counseling. Using these two strategies together makes you more likely to succeed in quitting than if you used either strategy on its own. If you are pregnant or breastfeeding, talk with your health care provider about finding counseling or other support strategies to quit smoking. Do not take  medicine to help you quit smoking unless told to do so by your health care provider. What things can I do to make it easier to quit? Quitting smoking might feel overwhelming at first, but there is a lot that you can do to make it easier. Take these important actions:  Reach out to your family and friends and ask that they support and encourage you during this time. Call telephone quitlines, reach out to support groups, or work with a counselor for support.  Ask people who smoke to avoid smoking around you.  Avoid places that trigger you to smoke, such as bars, parties, or smoke-break areas at work.  Spend time around people who do not smoke.  Lessen stress in your life, because stress can be a smoking trigger for some people. To lessen stress, try: ? Exercising regularly. ? Deep-breathing exercises. ? Yoga. ? Meditating. ? Performing a body scan. This involves closing your eyes, scanning your body from head to toe, and noticing which parts of your body are particularly tense. Purposefully relax the muscles in those areas.  Download or purchase mobile phone or tablet apps (applications) that can help you stick to your quit plan by providing reminders, tips, and encouragement. There are many free apps, such as QuitGuide from the [employer] developer(Centers for Disease Control and Prevention). You can find other support for quitting smoking (smoking cessation) through smokefree.gov and other websites.  How will I feel when I quit smoking? Within the first 24 hours of quitting smoking, you may start to feel some withdrawal symptoms. These symptoms are usually most noticeable 2-3 days after quitting, but they usually do not last beyond 2-3 weeks. Changes or symptoms that you might experience include:  Mood swings.  Restlessness, anxiety, or irritation.  Difficulty concentrating.  Dizziness.  Strong cravings for sugary foods in addition to nicotine.  Mild weight  gain.  Constipation.  Nausea.  Coughing or a sore throat.  Changes in how your medicines work in your body.  A depressed mood.  Difficulty sleeping (insomnia).  After the first 2-3 weeks of quitting, you may start to notice more positive results, such as:  Improved sense of smell and taste.  Decreased coughing and sore throat.  Slower heart rate.  Lower blood pressure.  Clearer skin.  The ability to breathe more easily.  Fewer sick days.  Quitting smoking is very challenging for most people. Do not get discouraged if you are not successful the first time. Some people need to make many attempts to quit before they achieve long-term success. Do your best to stick to your quit plan, and talk with your health care provider if you have any questions or concerns. This information is not intended to replace advice given to you by your health care provider. Make sure you discuss any questions you have with your health care provider. Document Released: 04/30/2001 Document Revised: 01/02/2016 Document Reviewed: 09/20/2014 Elsevier Interactive Patient Education  2017 [employer]

## 2016-12-18 ENCOUNTER — Telehealth: Payer: Self-pay

## 2016-12-18 NOTE — Telephone Encounter (Signed)
Pt. Called stating that she was prescribed fluticasone (FLONASE) 50 MCG/ACT nasal spray  Pt. States she is coughing and something is coming out of her mouth. Please f/u with pt.

## 2016-12-20 ENCOUNTER — Ambulatory Visit (HOSPITAL_COMMUNITY)
Admission: EM | Admit: 2016-12-20 | Discharge: 2016-12-20 | Disposition: A | Discharge status: Home / Self Care | Payer: Medicaid Other | Attending: Emergency Medicine | Admitting: Emergency Medicine

## 2016-12-20 ENCOUNTER — Encounter (HOSPITAL_COMMUNITY): Payer: Self-pay | Admitting: Family Medicine

## 2016-12-20 DIAGNOSIS — J40 Bronchitis, not specified as acute or chronic: Secondary | ICD-10-CM | POA: Diagnosis not present

## 2016-12-20 MED ORDER — PREDNISONE 50 MG PO TABS: ORAL_TABLET | ORAL | 0 refills | Status: DC

## 2016-12-20 MED ORDER — BENZONATATE 100 MG PO CAPS: 100.0000 mg | ORAL_CAPSULE | Freq: Three times a day (TID) | ORAL | 0 refills | Status: DC

## 2016-12-20 MED ORDER — AZITHROMYCIN 250 MG PO TABS: 250.0000 mg | ORAL_TABLET | Freq: Every day | ORAL | 0 refills | Status: DC

## 2016-12-20 NOTE — Discharge Instructions (Signed)
I am treating you for bronchitis. I have prescribed Azithromycin. Take 2 tablets today, then 1 tablet daily till finished. I have also prescribed a steroid called prednisone. Take one tablet daily with food. For cough, I have prescribed a medication called Tessalon. Take 1 tablet every 8 hours as needed for your cough. Should your symptoms fail to improve or worsen, follow up with your primary care provider, or return to clinic.  °

## 2016-12-20 NOTE — Telephone Encounter (Signed)
°  Pt. Called stating that she was prescribed fluticasone (FLONASE) 50 MCG/ACT nasal spray  Pt. States she is coughing and something is coming out of her mouth. Please f/u with pt     an

## 2016-12-20 NOTE — ED Triage Notes (Signed)
Pt here for URI symptoms. sts that both of her kids are sick.

## 2016-12-20 NOTE — ED Provider Notes (Signed)
[facility]   [id number] 12/20/16 Arrival Time: 1222  ASSESSMENT & PLAN:  1. Bronchitis     Meds ordered this encounter  Medications  . azithromycin (ZITHROMAX) 250 MG tablet    Sig: Take 1 tablet (250 mg total) by mouth daily. Take first 2 tablets together, then 1 every day until finished.    Dispense:  6 tablet    Refill:  0    Order Specific Question:   Supervising Provider    Answer:   Domenick GongMORTENSON, ASHLEY [[id number]]  . predniSONE (DELTASONE) 50 MG tablet    Sig: Take 1 tablet daily with food    Dispense:  5 tablet    Refill:  0    Order Specific Question:   Supervising Provider    Answer:   Domenick GongMORTENSON, ASHLEY [[id number]]  . benzonatate (TESSALON) 100 MG capsule    Sig: Take 1 capsule (100 mg total) by mouth every 8 (eight) hours.    Dispense:  21 capsule    Refill:  0    Order Specific Question:   Supervising Provider    Answer:   Domenick GongMORTENSON, ASHLEY [[id number]]    Reviewed expectations re: course of current medical issues. Questions answered. Outlined signs and symptoms indicating need for more acute intervention. Patient verbalized understanding. After Visit Summary given.   SUBJECTIVE:  [name] is a 36 y.o. female who presents with complaint of Cough, nonproductive, dry, hacking, with green sputum. Symptoms started more than 1 week ago, initially started as a URI, states she felt like she got better, and her symptoms reoccurred. Denies any sore throat, ear pain or pressure, nausea, vomiting, did have one episode of diarrhea today, no fever or chills. 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 Otherwise remainder of history of present illness and ROS is negative  ROS: As per HPI, otherwise negative.   OBJECTIVE:  Vitals:   12/20/16 1306  BP: (!) 135/96  Pulse: 88  Resp: 18  Temp: 98.8 F (37.1  C)  SpO2: 98%     General appearance: alert; no distress HEENT: normocephalic; atraumatic; conjunctivae normal; TMs normal; nasal mucosa normal; oral mucosa normal Neck: supple Lungs: clear to auscultation bilaterally Heart: regular rate and rhythm Abdomen: soft, non-tender; bowel sounds normal; no masses or organomegaly; no guarding or rebound tenderness Back: no CVA tenderness Extremities: no cyanosis or edema; symmetrical with no gross deformities Skin: warm and dry Neurologic: normal symmetric reflexes; normal gait Psychological:  alert and cooperative; normal mood and affect  Labs Reviewed - No data to display  No results found.  Allergies  Allergen Reactions  . Penicillins Nausea And Vomiting and Other (See Comments)    UNSPECIFIED REACTION  Has patient had a PCN reaction causing immediate rash, facial/tongue/throat swelling, SOB or lightheadedness with hypotension:No Has patient had a PCN reaction causing severe rash involving mucus membranes or skin necrosis:No Has patient had a PCN reaction that REACTION THAT REQUIRED HOSPITALIZATION:  #  #  #  YES  #  #  #  Has patient had a PCN reaction occurring within the last 10 years: #  #  #  YES  #  #  #   . Norvasc [Amlodipine Besylate] Other (See Comments)    Hypersensitivity? Tip of tongue numb and flushing.    PMHx, SurgHx, SocialHx, Medications, and Allergies  were reviewed in the Visit Navigator and updated as appropriate.      [name], Melissa Larios, NP 12/20/16 1359

## 2016-12-22 NOTE — Telephone Encounter (Signed)
Please call the patient and see if you can clarify message/get more information.  Thanks, Georgian CoAngela McClung, PA-C

## 2016-12-26 NOTE — Telephone Encounter (Signed)
Will route to nurse °

## 2016-12-26 NOTE — Telephone Encounter (Signed)
Contacted pt to clarify message pt didn't answer lvm asking pt to give me a call at her earliest convenience

## 2017-01-13 ENCOUNTER — Ambulatory Visit (INDEPENDENT_AMBULATORY_CARE_PROVIDER_SITE_OTHER): Payer: Medicaid Other

## 2017-01-13 ENCOUNTER — Ambulatory Visit (HOSPITAL_COMMUNITY)
Admission: EM | Admit: 2017-01-13 | Discharge: 2017-01-13 | Disposition: A | Discharge status: Home / Self Care | Payer: Medicaid Other | Attending: Family Medicine | Admitting: Family Medicine

## 2017-01-13 ENCOUNTER — Encounter (HOSPITAL_COMMUNITY): Payer: Self-pay | Admitting: Emergency Medicine

## 2017-01-13 DIAGNOSIS — N10 Acute tubulo-interstitial nephritis: Secondary | ICD-10-CM

## 2017-01-13 DIAGNOSIS — Z9049 Acquired absence of other specified parts of digestive tract: Secondary | ICD-10-CM | POA: Diagnosis not present

## 2017-01-13 DIAGNOSIS — R109 Unspecified abdominal pain: Secondary | ICD-10-CM | POA: Diagnosis present

## 2017-01-13 DIAGNOSIS — Z880 Allergy status to penicillin: Secondary | ICD-10-CM | POA: Insufficient documentation

## 2017-01-13 LAB — POCT URINALYSIS DIP (DEVICE)
Bilirubin Urine: NEGATIVE
GLUCOSE, UA: NEGATIVE mg/dL
Hgb urine dipstick: NEGATIVE
Ketones, ur: NEGATIVE mg/dL
LEUKOCYTES UA: NEGATIVE
Nitrite: NEGATIVE
PROTEIN: 100 mg/dL — AB
Specific Gravity, Urine: 1.025 (ref 1.005–1.030)
UROBILINOGEN UA: 0.2 mg/dL (ref 0.0–1.0)
pH: 6 (ref 5.0–8.0)

## 2017-01-13 MED ORDER — CEPHALEXIN 500 MG PO CAPS
500.0000 mg | ORAL_CAPSULE | Freq: Four times a day (QID) | ORAL | 0 refills | Status: DC
Start: 2017-01-13 — End: 2017-05-29

## 2017-01-13 MED ORDER — PHENAZOPYRIDINE HCL 200 MG PO TABS: 200.0000 mg | ORAL_TABLET | Freq: Three times a day (TID) | ORAL | 0 refills | Status: DC | PRN

## 2017-01-13 NOTE — ED Provider Notes (Signed)
[facility]   [id number] 01/13/17 Arrival Time: 1201   SUBJECTIVE:  AANIYA STERBA is a 36 y.o. female who presents to the urgent care with complaint of left-sided flank pain, along with increased urinary frequency, urgency, and dysuria ongoing for approximately 2 weeks. Denies any foul odors or discoloration to urine, no vaginal itching, or other urinary symptoms. Has had fatigue, and chills, denies any nausea, vomiting, diarrhea or fever, reports loss of appetite. Denies possibility of pregnancy, stating she does not currently have a partner denies any other symptoms or significant history  ROS: As per HPI, remainder of ROS negative.   OBJECTIVE:  Vitals:   01/13/17 1259  BP: (!) 126/94  Pulse: 91  Resp: 18  Temp: 98.8 F (37.1 C)  TempSrc: Oral  SpO2: 96%     General appearance: alert; no distress HEENT: normocephalic; atraumatic; conjunctivae normal;  Neck: Trachea midline no JVD noted Lungs: clear to auscultation bilaterally Heart: regular rate and rhythm Abdomen: soft, tender suprapubic area; bowel sounds normal; no masses or organomegaly; no guarding or rebound tenderness Musculoskeletal: Left sided CVA tenderness Skin: warm and dry Neurologic: Grossly normal Psychological:  alert and cooperative; normal mood and affect     ASSESSMENT & PLAN:  1. Acute pyelonephritis     Meds ordered this encounter  Medications  . cephALEXin (KEFLEX) 500 MG capsule    Sig: Take 1 capsule (500 mg total) by mouth 4 (four) times daily.    Dispense:  20 capsule    Refill:  0    Order Specific Question:   Supervising Provider    Answer:   Mardella Layman [id number]  . phenazopyridine (PYRIDIUM) 200 MG tablet    Sig: Take 1 tablet (200 mg total) by mouth 3 (three) times daily as needed for pain.    Dispense:  10 tablet    Refill:  0    Order Specific Question:   Supervising Provider    Answer:   Mardella Layman [[id number]]      Reviewed expectations re: course  of current medical issues. Questions answered. Outlined signs and symptoms indicating need for more acute intervention. Patient verbalized understanding. After Visit Summary given.    Procedures:     Results for orders placed or performed during the hospital encounter of 01/13/17  POCT urinalysis dip (device)  Result Value Ref Range   Glucose, UA NEGATIVE NEGATIVE mg/dL   Bilirubin Urine NEGATIVE NEGATIVE   Ketones, ur NEGATIVE NEGATIVE mg/dL   Specific Gravity, Urine 1.025 1.005 - 1.030   Hgb urine dipstick NEGATIVE NEGATIVE   pH 6.0 5.0 - 8.0   Protein, ur 100 (A) NEGATIVE mg/dL   Urobilinogen, UA 0.2 0.0 - 1.0 mg/dL   Nitrite NEGATIVE NEGATIVE   Leukocytes, UA NEGATIVE NEGATIVE    Labs Reviewed  POCT URINALYSIS DIP (DEVICE) - Abnormal; Notable for the following:       Result Value   Protein, ur 100 (*)    All other components within normal limits  URINE CULTURE    Dg Abdomen 1 View  Result Date: 01/13/2017 CLINICAL DATA:  Bilateral abdominal pain for the past 3-4 days. Cholecystectomy earlier this year. EXAM: ABDOMEN - 1 VIEW COMPARISON:  08/15/2014. FINDINGS: Normal bowel gas pattern. Stable right inferior pelvic phlebolith. Cholecystectomy clips. Unremarkable bones. IMPRESSION: No acute abnormality. Electronically Signed   By: Beckie Salts M.D.   On: 01/13/2017 13:38    Allergies  Allergen Reactions  . Penicillins Nausea And Vomiting and Other (See Comments)  UNSPECIFIED REACTION  Has patient had a PCN reaction causing immediate rash, facial/tongue/throat swelling, SOB or lightheadedness with hypotension:No Has patient had a PCN reaction causing severe rash involving mucus membranes or skin necrosis:No Has patient had a PCN reaction that REACTION THAT REQUIRED HOSPITALIZATION:  #  #  #  YES  #  #  #  Has patient had a PCN reaction occurring within the last 10 years: #  #  #  YES  #  #  #   . Norvasc [Amlodipine Besylate] Other (See Comments)    Hypersensitivity?  Tip of tongue numb and flushing.    PMHx, SurgHx, SocialHx, Medications, and Allergies were reviewed in the Visit Navigator and updated as appropriate.       Dorena Bodo, NP 01/13/17 1402

## 2017-01-13 NOTE — ED Triage Notes (Signed)
Left epigastric pain for 2 weeks, intermittent.  The last few days started having pain in right epigastric area.  Denies nausea, denies vomiting.  Diarrhea for a week.  Reports 2 diarrhea episodes today.  Feels like bladder full after urinating is complete.  Denies burning with urination

## 2017-01-13 NOTE — Discharge Instructions (Signed)
You are being treated today for a type of kidney infection. I have prescribed Keflex, take 1 tablet 4 times a day for 5 days. I have also prescribed Pyridium. Take 1 tablet a day 3 times a day for 2 days. Your urine will be sent for culture and you will be notified should any change in therapy be needed. Drink plenty of fluids and rest. Should your symptoms fail to resolve, follow up with your primary care provider or return to clinic.

## 2017-01-16 ENCOUNTER — Other Ambulatory Visit: Payer: Self-pay | Admitting: Family Medicine

## 2017-01-16 LAB — URINE CULTURE

## 2017-01-28 ENCOUNTER — Ambulatory Visit: Payer: Self-pay | Admitting: Family Medicine

## 2017-02-23 ENCOUNTER — Other Ambulatory Visit: Payer: Self-pay | Admitting: Family Medicine

## 2017-04-22 ENCOUNTER — Other Ambulatory Visit: Payer: Self-pay | Admitting: Family Medicine

## 2017-04-25 ENCOUNTER — Emergency Department (HOSPITAL_COMMUNITY)
Admission: EM | Admit: 2017-04-25 | Discharge: 2017-04-25 | Disposition: A | Discharge status: Home / Self Care | Payer: Medicaid Other

## 2017-05-19 ENCOUNTER — Other Ambulatory Visit: Payer: Self-pay | Admitting: Pharmacist

## 2017-05-23 ENCOUNTER — Other Ambulatory Visit: Payer: Self-pay | Admitting: Family Medicine

## 2017-05-24 ENCOUNTER — Other Ambulatory Visit: Payer: Self-pay | Admitting: Internal Medicine

## 2017-05-24 MED ORDER — BUDESONIDE-FORMOTEROL FUMARATE 160-4.5 MCG/ACT IN AERO: 2.0000 | INHALATION_SPRAY | Freq: Two times a day (BID) | RESPIRATORY_TRACT | 3 refills | Status: DC

## 2017-05-24 MED ORDER — ALBUTEROL SULFATE HFA 108 (90 BASE) MCG/ACT IN AERS: 1.0000 | INHALATION_SPRAY | Freq: Four times a day (QID) | RESPIRATORY_TRACT | 5 refills | Status: DC | PRN

## 2017-05-26 ENCOUNTER — Encounter: Payer: Self-pay | Admitting: Pharmacist

## 2017-05-26 NOTE — Telephone Encounter (Addendum)
Patient's Medicaid is not active. No prior authorization needed at this time. MyChart message sent to patient to let her know that she either needs to transfer the medication here or call Medicaid if she does not believe that her coverage has ended.

## 2017-05-29 ENCOUNTER — Encounter (HOSPITAL_COMMUNITY): Payer: Self-pay | Admitting: Emergency Medicine

## 2017-05-29 ENCOUNTER — Other Ambulatory Visit: Payer: Self-pay

## 2017-05-29 ENCOUNTER — Ambulatory Visit (HOSPITAL_COMMUNITY)
Admission: EM | Admit: 2017-05-29 | Discharge: 2017-05-29 | Disposition: A | Discharge status: Home / Self Care | Payer: Self-pay | Attending: Family Medicine | Admitting: Family Medicine

## 2017-05-29 DIAGNOSIS — N83209 Unspecified ovarian cyst, unspecified side: Secondary | ICD-10-CM | POA: Insufficient documentation

## 2017-05-29 DIAGNOSIS — F17210 Nicotine dependence, cigarettes, uncomplicated: Secondary | ICD-10-CM | POA: Insufficient documentation

## 2017-05-29 DIAGNOSIS — F419 Anxiety disorder, unspecified: Secondary | ICD-10-CM | POA: Insufficient documentation

## 2017-05-29 DIAGNOSIS — Z79899 Other long term (current) drug therapy: Secondary | ICD-10-CM | POA: Insufficient documentation

## 2017-05-29 DIAGNOSIS — J029 Acute pharyngitis, unspecified: Secondary | ICD-10-CM

## 2017-05-29 DIAGNOSIS — J019 Acute sinusitis, unspecified: Secondary | ICD-10-CM | POA: Diagnosis present

## 2017-05-29 DIAGNOSIS — I10 Essential (primary) hypertension: Secondary | ICD-10-CM | POA: Insufficient documentation

## 2017-05-29 DIAGNOSIS — K219 Gastro-esophageal reflux disease without esophagitis: Secondary | ICD-10-CM | POA: Insufficient documentation

## 2017-05-29 DIAGNOSIS — R05 Cough: Secondary | ICD-10-CM | POA: Insufficient documentation

## 2017-05-29 DIAGNOSIS — Z880 Allergy status to penicillin: Secondary | ICD-10-CM | POA: Insufficient documentation

## 2017-05-29 DIAGNOSIS — J45909 Unspecified asthma, uncomplicated: Secondary | ICD-10-CM | POA: Insufficient documentation

## 2017-05-29 DIAGNOSIS — R51 Headache: Secondary | ICD-10-CM | POA: Insufficient documentation

## 2017-05-29 DIAGNOSIS — Z87442 Personal history of urinary calculi: Secondary | ICD-10-CM | POA: Insufficient documentation

## 2017-05-29 DIAGNOSIS — F329 Major depressive disorder, single episode, unspecified: Secondary | ICD-10-CM | POA: Insufficient documentation

## 2017-05-29 LAB — POCT RAPID STREP A: Streptococcus, Group A Screen (Direct): NEGATIVE

## 2017-05-29 MED ORDER — ALBUTEROL SULFATE HFA 108 (90 BASE) MCG/ACT IN AERS: 1.0000 | INHALATION_SPRAY | Freq: Four times a day (QID) | RESPIRATORY_TRACT | 5 refills | Status: DC | PRN

## 2017-05-29 MED ORDER — AZITHROMYCIN 250 MG PO TABS: 250.0000 mg | ORAL_TABLET | Freq: Every day | ORAL | 0 refills | Status: DC

## 2017-05-29 MED ORDER — AZITHROMYCIN 250 MG PO TABS: 250.0000 mg | ORAL_TABLET | Freq: Every day | ORAL | 0 refills | Status: AC

## 2017-05-29 MED ORDER — DOXYCYCLINE HYCLATE 100 MG PO CAPS: 100.0000 mg | ORAL_CAPSULE | Freq: Two times a day (BID) | ORAL | 0 refills | Status: DC

## 2017-05-29 NOTE — ED Provider Notes (Signed)
[facility]    CSN: [id number] Arrival date & time: 05/29/17  1223     History   Chief Complaint Chief Complaint  Patient presents with  . Cough  . Facial Pain    HPI [name] is a 37 y.o. female history of asthma Patient is presenting with URI symptoms- congestion, cough, sore throat. Patient's main complaints are persistent symptoms and facial pressure. Symptoms have been going on since October (2-3 months). Patient has tried mucinex, zyrtec, ibuprofen, nyquil, with minimal relief. Denies fever, nausea, vomiting, diarrhea. Denies shortness of breath and chest pain. Patient is a smoker, about 1/2 pack a day.    HPI  Past Medical History:  Diagnosis Date  . Anxiety   . Asthma   . Depression    doing good, not on meds now  . GERD (gastroesophageal reflux disease)   . Headache    migraines  . History of kidney stones   . Hypertension   . Kidney stones   . Missed abortion 10/02/2013  . Ovarian cyst   . Pregnancy induced hypertension   . Vaginal Pap smear, abnormal    bx, f/u ok    Patient Active Problem List   Diagnosis Date Noted  . Acute sinusitis with symptoms > 10 days 05/29/2017  . Postpartum complication 06/13/2015  . Normal labor and delivery 06/06/2015  . Indication for care in labor or delivery 06/06/2015  . S/P cesarean section 06/06/2015  . Asthma, chronic 07/29/2014  . Depression 07/29/2014  . Smoking 07/29/2014  . Essential hypertension, benign 07/29/2014  . Missed abortion 10/02/2013    Past Surgical History:  Procedure Laterality Date  . CESAREAN SECTION    . CESAREAN SECTION N/A 06/06/2015   Procedure: CESAREAN SECTION;  Surgeon: Kathreen CosierBernard A Marshall, MD;  Location: [facility];  Service: Obstetrics;  Laterality: N/A;  . CHOLECYSTECTOMY N/A 08/23/2016   Procedure: LAPAROSCOPIC CHOLECYSTECTOMY;  Surgeon: Berna Buehelsea A Connor, MD;  Location: [facility];  Service: General;  Laterality: N/A;  . DILATION AND CURETTAGE OF UTERUS    . DILATION  AND EVACUATION N/A 10/04/2013   Procedure: DILATATION AND EVACUATION;  Surgeon: Sherian ReinJody Bovard-Stuckert, MD;  Location: [facility];  Service: Gynecology;  Laterality: N/A;  US in room   . TONSILLECTOMY    . TUBAL LIGATION      OB History    Gravida Para Term Preterm AB Living   4 2 2  0 2 2   SAB TAB Ectopic Multiple Live Births   1 1 0 0 2       Home Medications    Prior to Admission medications   Medication Sig Start Date End Date Taking? Authorizing Provider  fluticasone (FLONASE) 50 MCG/ACT nasal spray Place 2 sprays into both nostrils daily. 12/12/16  Yes Georgian CoMcClung, Angela M, PA-C  hydrochlorothiazide (HYDRODIURIL) 25 MG tablet Take 1 tablet (25 mg total) by mouth daily. 12/12/16  Yes Georgian CoMcClung, Angela M, PA-C  metoprolol tartrate (LOPRESSOR) 25 MG tablet Take 1 tablet (25 mg total) by mouth 2 (two) times daily. 12/12/16  Yes McClung, Marzella SchleinAngela M, PA-C  albuterol (PROVENTIL HFA;VENTOLIN HFA) 108 (90 Base) MCG/ACT inhaler Inhale 1-2 puffs into the lungs every 6 (six) hours as needed for wheezing or shortness of breath. 05/29/17   Lanard Arguijo C, PA-C  azithromycin (ZITHROMAX) 250 MG tablet Take 1 tablet (250 mg total) by mouth daily for 5 days. Take first 2 tablets together, then 1 every day until finished. 05/29/17 06/03/17  Veida Spira, Junius CreamerHallie C, PA-C  Brexpiprazole (REXULTI) 2 MG TABS Take 1 tablet by mouth at bedtime.    [provider]  budesonide-formoterol (SYMBICORT) 160-4.5 MCG/ACT inhaler Inhale 2 puffs into the lungs 2 (two) times daily. Patient not taking: Reported on 05/29/2017 05/24/17   Marcine Matar, MD  ibuprofen (ADVIL,MOTRIN) 200 MG tablet Take 800 mg by mouth every 8 (eight) hours as needed (for pain/headache.).    [provider]    Family History Family History  Problem Relation Age of Onset  . Hearing loss Mother   . Asthma Mother   . Asthma Father   . Diabetes Son   . Cancer Maternal Grandfather        bone  . Liver disease Maternal Grandfather   .  Asthma Sister   . Lupus Sister     Social History Social History   Tobacco Use  . Smoking status: Current Some Day Smoker    Packs/day: 0.25    Years: 15.00    Pack years: 3.75    Types: Cigarettes  . Smokeless tobacco: Never Used  . Tobacco comment: Smoking .5 ppd  Substance Use Topics  . Alcohol use: Yes    Alcohol/week: 2.4 oz    Types: 4 Cans of beer per week    Comment: very occassionally  . Drug use: No     Allergies   Penicillins and Norvasc [amlodipine besylate]   Review of Systems Review of Systems  Constitutional: Negative for chills, fatigue and fever.  HENT: Positive for congestion, ear pain, rhinorrhea, sinus pressure and sore throat. Negative for trouble swallowing.   Respiratory: Positive for cough. Negative for chest tightness and shortness of breath.   Cardiovascular: Negative for chest pain.  Gastrointestinal: Negative for abdominal pain, nausea and vomiting.  Musculoskeletal: Negative for myalgias.  Skin: Negative for rash.  Neurological: Positive for headaches. Negative for dizziness and light-headedness.     Physical Exam Triage Vital Signs ED Triage Vitals  Enc Vitals Group     BP 05/29/17 1311 132/85     Pulse Rate 05/29/17 1311 79     Resp 05/29/17 1311 18     Temp 05/29/17 1311 98.3 F (36.8 C)     Temp src --      SpO2 05/29/17 1311 100 %     Weight --      Height --      Head Circumference --      Peak Flow --      Pain Score 05/29/17 1312 10     Pain Loc --      Pain Edu? --      Excl. in GC? --    No data found.  Updated Vital Signs BP 132/85   Pulse 79   Temp 98.3 F (36.8 C)   Resp 18   LMP 05/14/2017   SpO2 100%    Physical Exam  Constitutional: She is oriented to person, place, and time. She appears well-developed and well-nourished. No distress.  HENT:  Head: Normocephalic and atraumatic.  Right Ear: Tympanic membrane and ear canal normal.  Left Ear: Tympanic membrane and ear canal normal.  Nose: No  rhinorrhea. Right sinus exhibits maxillary sinus tenderness and frontal sinus tenderness. Left sinus exhibits maxillary sinus tenderness and frontal sinus tenderness.  Mouth/Throat: Uvula is midline and mucous membranes are normal. No oral lesions. No trismus in the jaw. No uvula swelling. Posterior oropharyngeal erythema present. No tonsillar exudate.  Eyes: Conjunctivae are normal.  Neck: Neck supple.  Cardiovascular: Normal  rate and regular rhythm.  No murmur heard. Pulmonary/Chest: Effort normal and breath sounds normal. No respiratory distress. She has no wheezes. She has no rales.  Abdominal: Soft. There is no tenderness.  Musculoskeletal: She exhibits no edema.  Neurological: She is alert and oriented to person, place, and time.  Skin: Skin is warm and dry.  Psychiatric: She has a normal mood and affect.  Nursing note and vitals reviewed.    UC Treatments / Results  Labs (all labs ordered are listed, but only abnormal results are displayed) Labs Reviewed  CULTURE, GROUP A STREP [facility])  POCT RAPID STREP A    EKG  EKG Interpretation None       Radiology No results found.  Procedures Procedures (including critical care time)  Medications Ordered in UC Medications - No data to display   Initial Impression / Assessment and Plan / UC Course  I have reviewed the triage vital signs and the nursing notes.  Pertinent labs & imaging results that were available during my care of the patient were reviewed by me and considered in my medical decision making (see chart for details).     Patient presents with symptoms likely from sinusitis vs. Allergic rhinits. No wheezing, does not appear seem to have lung involvement at this point.  Differential includes bacterial pneumonia, acute bronchitis, viral URI. Do not suspect underlying cardiopulmonary process. Symptoms seem unlikely related to ACS, CHF or COPD exacerbations, pneumonia, pneumothorax. Patient is nontoxic appearing and  not in need of emergent medical intervention.  Will treat with azithromycin- unclear PCN allergy, doxy too expensive. Discussed symptoms may be related to allergic rhinitis and to do zyrtec consistently at least 1 week, flonase, local honey use.   Return if symptoms fail to improve in 1-2 weeks or you develop shortness of breath, chest pain, severe headache. Patient states understanding and is agreeable.   Final Clinical Impressions(s) / UC Diagnoses   Final diagnoses:  Acute sinusitis with symptoms > 10 days    ED Discharge Orders        Ordered    doxycycline (VIBRAMYCIN) 100 MG capsule  2 times daily,   Status:  Discontinued     05/29/17 1411    azithromycin (ZITHROMAX) 250 MG tablet  Daily,   Status:  Discontinued     05/29/17 1419    albuterol (PROVENTIL HFA;VENTOLIN HFA) 108 (90 Base) MCG/ACT inhaler  Every 6 hours PRN     05/29/17 1419    azithromycin (ZITHROMAX) 250 MG tablet  Daily     05/29/17 1423       Controlled Substance Prescriptions [place] Controlled Substance Registry consulted? Not Applicable   Lew Dawes, [place] 05/29/17 1433

## 2017-05-29 NOTE — Discharge Instructions (Addendum)
We are treating you for a sinus infection with doxycycline twice daily for 7 days.   If symptoms persisting, may be related to allergic rhinitis (info attached). For this please take a daily zyrtec/claritin/allegra (store brand okay)- this must be taken consistently for a week to have an effect on congestion. You may also pair with Flonase nasal spray daily.   Please try daily local honey or honey tea for sore throat/cough and also to help your body adjust to local pollens.

## 2017-05-29 NOTE — ED Triage Notes (Signed)
Pt states "I have pressure in my face, coughing since October".

## 2017-06-01 LAB — CULTURE, GROUP A STREP (THRC)

## 2017-06-15 ENCOUNTER — Encounter (HOSPITAL_COMMUNITY): Payer: Self-pay | Admitting: *Deleted

## 2017-06-15 ENCOUNTER — Emergency Department (HOSPITAL_COMMUNITY): Payer: Self-pay

## 2017-06-15 ENCOUNTER — Other Ambulatory Visit: Payer: Self-pay

## 2017-06-15 ENCOUNTER — Emergency Department (HOSPITAL_COMMUNITY)
Admission: EM | Admit: 2017-06-15 | Discharge: 2017-06-15 | Disposition: A | Discharge status: Home / Self Care | Payer: Self-pay | Attending: Emergency Medicine | Admitting: Emergency Medicine

## 2017-06-15 DIAGNOSIS — F419 Anxiety disorder, unspecified: Secondary | ICD-10-CM | POA: Insufficient documentation

## 2017-06-15 DIAGNOSIS — Z79899 Other long term (current) drug therapy: Secondary | ICD-10-CM | POA: Insufficient documentation

## 2017-06-15 DIAGNOSIS — Z9049 Acquired absence of other specified parts of digestive tract: Secondary | ICD-10-CM | POA: Insufficient documentation

## 2017-06-15 DIAGNOSIS — R0602 Shortness of breath: Secondary | ICD-10-CM | POA: Insufficient documentation

## 2017-06-15 DIAGNOSIS — F17210 Nicotine dependence, cigarettes, uncomplicated: Secondary | ICD-10-CM | POA: Insufficient documentation

## 2017-06-15 DIAGNOSIS — J0100 Acute maxillary sinusitis, unspecified: Secondary | ICD-10-CM

## 2017-06-15 DIAGNOSIS — F329 Major depressive disorder, single episode, unspecified: Secondary | ICD-10-CM | POA: Insufficient documentation

## 2017-06-15 DIAGNOSIS — I10 Essential (primary) hypertension: Secondary | ICD-10-CM | POA: Insufficient documentation

## 2017-06-15 DIAGNOSIS — J45909 Unspecified asthma, uncomplicated: Secondary | ICD-10-CM | POA: Insufficient documentation

## 2017-06-15 MED ORDER — IPRATROPIUM-ALBUTEROL 0.5-2.5 (3) MG/3ML IN SOLN
3.0000 mL | Freq: Once | RESPIRATORY_TRACT | Status: AC
  Administered 2017-06-15: 3 mL via RESPIRATORY_TRACT
  Filled 2017-06-15: qty 3

## 2017-06-15 MED ORDER — AMOXICILLIN-POT CLAVULANATE 875-125 MG PO TABS: 1.0000 | ORAL_TABLET | Freq: Two times a day (BID) | ORAL | 0 refills | Status: DC

## 2017-06-15 MED ORDER — AMOXICILLIN-POT CLAVULANATE 875-125 MG PO TABS
1.0000 | ORAL_TABLET | Freq: Once | ORAL | Status: AC
  Administered 2017-06-15: 1 via ORAL
  Filled 2017-06-15: qty 1

## 2017-06-15 NOTE — ED Notes (Signed)
Declined W/C at D/C and was escorted to lobby by RN. 

## 2017-06-15 NOTE — ED Triage Notes (Signed)
Pt reports ongoing cough and sinus congestion. Was [facility] for it but states symptoms have gotten worse with green mucus and productive cough. Has right side facial pain. No acute distress noted at triage.

## 2017-06-15 NOTE — ED Provider Notes (Signed)
[facility] EMERGENCY DEPARTMENT Provider Note   CSN: [id number] Arrival date & time: 06/15/17  1250     History   Chief Complaint Chief Complaint  Patient presents with  . Cough  . Facial Pain    HPI [name] is a 37 y.o. female.  HPI   37 year old female presents today with complaints of upper respiratory infection.  Patient notes since October she has had sinus pressure and pain.  She notes that she was seen on 05/29/2017 for this at urgent care.  She was placed on azithromycin as she was on a for doxycycline.  She notes his symptoms have not improved and continues to have sinus pressure, worse on the right side of face, rhinorrhea, congestion, cough and minor shortness of breath.  Patient notes that she has lost insurance and does not have access to care other than the emergency room.  She notes that she no longer takes Symbicort for her asthma, she does have a pro-air at home which has not been improving her symptoms.  Patient denies any fever, notes bilateral nasal discharge.  Past Medical History:  Diagnosis Date  . Anxiety   . Asthma   . Depression    doing good, not on meds now  . GERD (gastroesophageal reflux disease)   . Headache    migraines  . History of kidney stones   . Hypertension   . Kidney stones   . Missed abortion 10/02/2013  . Ovarian cyst   . Pregnancy induced hypertension   . Vaginal Pap smear, abnormal    bx, f/u ok    Patient Active Problem List   Diagnosis Date Noted  . Acute sinusitis with symptoms > 10 days 05/29/2017  . Postpartum complication 06/13/2015  . Normal labor and delivery 06/06/2015  . Indication for care in labor or delivery 06/06/2015  . S/P cesarean section 06/06/2015  . Asthma, chronic 07/29/2014  . Depression 07/29/2014  . Smoking 07/29/2014  . Essential hypertension, benign 07/29/2014  . Missed abortion 10/02/2013    Past Surgical History:  Procedure Laterality Date  . CESAREAN SECTION     . CESAREAN SECTION N/A 06/06/2015   Procedure: CESAREAN SECTION;  Surgeon: Kathreen Cosier, MD;  Location: [facility];  Service: Obstetrics;  Laterality: N/A;  . CHOLECYSTECTOMY N/A 08/23/2016   Procedure: LAPAROSCOPIC CHOLECYSTECTOMY;  Surgeon: Berna Bue, MD;  Location: [facility] OR;  Service: General;  Laterality: N/A;  . DILATION AND CURETTAGE OF UTERUS    . DILATION AND EVACUATION N/A 10/04/2013   Procedure: DILATATION AND EVACUATION;  Surgeon: Sherian Rein, MD;  Location: [facility];  Service: Gynecology;  Laterality: N/A;  [place] in room   . TONSILLECTOMY    . TUBAL LIGATION      OB History    Gravida Para Term Preterm AB Living   4 2 2  0 2 2   SAB TAB Ectopic Multiple Live Births   1 1 0 0 2       Home Medications    Prior to Admission medications   Medication Sig Start Date End Date Taking? Authorizing Provider  albuterol (PROVENTIL HFA;VENTOLIN HFA) 108 (90 Base) MCG/ACT inhaler Inhale 1-2 puffs into the lungs every 6 (six) hours as needed for wheezing or shortness of breath. 05/29/17   Wieters, Hallie C, PA-C  amoxicillin-clavulanate (AUGMENTIN) 875-125 MG tablet Take 1 tablet by mouth 2 (two) times daily. 06/15/17   Trevonne Nyland, Tinnie Gens, PA-C  Brexpiprazole (REXULTI) 2 MG TABS Take 1 tablet  by mouth at bedtime.    [provider]  budesonide-formoterol (SYMBICORT) 160-4.5 MCG/ACT inhaler Inhale 2 puffs into the lungs 2 (two) times daily. Patient not taking: Reported on 05/29/2017 05/24/17   Marcine MatarJohnson, Deborah B, MD  fluticasone [facility](FLONASE) 50 MCG/ACT nasal spray Place 2 sprays into both nostrils daily. 12/12/16   Anders SimmondsMcClung, Angela M, PA-C  hydrochlorothiazide (HYDRODIURIL) 25 MG tablet Take 1 tablet (25 mg total) by mouth daily. 12/12/16   Anders SimmondsMcClung, Angela M, PA-C  ibuprofen (ADVIL,MOTRIN) 200 MG tablet Take 800 mg by mouth every 8 (eight) hours as needed (for pain/headache.).    [provider]  metoprolol tartrate (LOPRESSOR) 25 MG tablet Take 1 tablet (25 mg total) by mouth  2 (two) times daily. 12/12/16   Anders SimmondsMcClung, Angela M, PA-C    Family History Family History  Problem Relation Age of Onset  . Hearing loss Mother   . Asthma Mother   . Asthma Father   . Diabetes Son   . Cancer Maternal Grandfather        bone  . Liver disease Maternal Grandfather   . Asthma Sister   . Lupus Sister     Social History Social History   Tobacco Use  . Smoking status: Current Some Day Smoker    Packs/day: 0.25    Years: 15.00    Pack years: 3.75    Types: Cigarettes  . Smokeless tobacco: Never Used  . Tobacco comment: Smoking .5 ppd  Substance Use Topics  . Alcohol use: Yes    Alcohol/week: 2.4 oz    Types: 4 Cans of beer per week    Comment: very occassionally  . Drug use: No     Allergies   Penicillins and Norvasc [amlodipine besylate]   Review of Systems Review of Systems  All other systems reviewed and are negative.    Physical Exam Updated Vital Signs BP (!) 155/109   Pulse 94   Temp 97.9 F (36.6 C) (Oral)   Resp 16   Ht 5\' 4"  (1.626 m)   Wt 70.3 kg (155 lb)   LMP 06/08/2017   SpO2 96%   BMI 26.61 kg/m   Physical Exam  Constitutional: She is oriented to person, place, and time. She appears well-developed and well-nourished.  HENT:  Head: Normocephalic and atraumatic.  Mouth/Throat: Uvula is midline, oropharynx is clear and moist and mucous membranes are normal. No uvula swelling. No oropharyngeal exudate, posterior oropharyngeal edema, posterior oropharyngeal erythema or tonsillar abscesses. Tonsils are 0 on the right. Tonsils are 0 on the left. No tonsillar exudate.  Tenderness over the right maxillary sinus-bilateral TMs normal-oropharynx normal  Eyes: Conjunctivae are normal. Pupils are equal, round, and reactive to light. Right eye exhibits no discharge. Left eye exhibits no discharge. No scleral icterus.  Neck: Normal range of motion. No JVD present. No tracheal deviation present.  Cardiovascular: Normal rate, regular rhythm and  normal heart sounds.  Pulmonary/Chest: Effort normal. No stridor. No respiratory distress. She has wheezes. She has no rales. She exhibits no tenderness.  Bilateral minor expiratory wheeze  Neurological: She is alert and oriented to person, place, and time. Coordination normal.  Psychiatric: She has a normal mood and affect. Her behavior is normal. Judgment and thought content normal.  Nursing note and vitals reviewed.    ED Treatments / Results  Labs (all labs ordered are listed, but only abnormal results are displayed) Labs Reviewed - No data to display  EKG  EKG Interpretation None  Radiology Dg Chest 2 View  Result Date: 06/15/2017 CLINICAL DATA:  37 year old female with ongoing cough and sinus congestion. EXAM: CHEST  2 VIEW COMPARISON:  12/11/2016 FINDINGS: The heart size and mediastinal contours are within normal limits. Both lungs are clear. The visualized skeletal structures are unremarkable. IMPRESSION: No active cardiopulmonary disease. Electronically Signed   By: Tollie Eth M.D.   On: 06/15/2017 16:05    Procedures Procedures (including critical care time)  Medications Ordered in ED Medications  ipratropium-albuterol (DUONEB) 0.5-2.5 (3) MG/3ML nebulizer solution 3 mL (3 mLs Nebulization Given 06/15/17 1622)  amoxicillin-clavulanate (AUGMENTIN) 875-125 MG per tablet 1 tablet (1 tablet Oral Given 06/15/17 1704)     Initial Impression / Assessment and Plan / ED Course  I have reviewed the triage vital signs and the nursing notes.  Pertinent labs & imaging results that were available during my care of the patient were reviewed by me and considered in my medical decision making (see chart for details).      Final Clinical Impressions(s) / ED Diagnoses   Final diagnoses:  Acute maxillary sinusitis, recurrence not specified   Labs:   Imaging: DG chest 2 view-no acute abnormality  Consults:  Therapeutics: DuoNeb, Augmentin  Discharge Meds:  Augmentin  Assessment/Plan: 37 year old female presents today with likely bacterial sinusitis.  She has had ongoing symptoms.  She was evaluated and treated with azithromycin given the cost of the other antibiotics.  This did not improve her symptoms as they continue to persist.  Patient will need Augmentin at this time.  Patient reports she is allergic to penicillins but notes that she does not know what her allergy is and was told from her mother at a young age.  She notes she is taken amoxicillin in the past and has not had any reactions.  I find it prudent to initiate Augmentin at this time.  Patient also with mild wheeze here, history of asthma, using pro-air at home.  Breathing treatment resolved wheezing, no acute findings within the chest to indicate bacterial pneumonia or respiratory distress.  Patient is slightly hypertensive here, she reports that she is taking her blood pressure medication at home.  She notes that she has lost her insurance, but does follow along at [facility].  I encouraged her to follow-up again with them, return immediately with any new or worsening signs or symptoms.  She verbalized understanding and agreement to today's plan had no further questions or concerns at the time of discharge.     ED Discharge Orders        Ordered    amoxicillin-clavulanate (AUGMENTIN) 875-125 MG tablet  2 times daily     06/15/17 1653       Rosalio Loud 06/15/17 1716    Nira Conn, MD 06/16/17 2124

## 2017-06-15 NOTE — Discharge Instructions (Signed)
Please read attached information. If you experience any new or worsening signs or symptoms please return to the emergency room for evaluation. Please follow-up with your primary care provider or specialist as discussed. Please use medication prescribed only as directed and discontinue taking if you have any concerning signs or symptoms.   °

## 2017-07-19 ENCOUNTER — Emergency Department (HOSPITAL_COMMUNITY)
Admission: EM | Admit: 2017-07-19 | Discharge: 2017-07-20 | Disposition: A | Discharge status: Other Acute Inpatient Hospital | Payer: Self-pay | Attending: Emergency Medicine | Admitting: Emergency Medicine

## 2017-07-19 ENCOUNTER — Encounter (HOSPITAL_COMMUNITY): Payer: Self-pay | Admitting: *Deleted

## 2017-07-19 ENCOUNTER — Other Ambulatory Visit: Payer: Self-pay

## 2017-07-19 DIAGNOSIS — I10 Essential (primary) hypertension: Secondary | ICD-10-CM | POA: Insufficient documentation

## 2017-07-19 DIAGNOSIS — F17210 Nicotine dependence, cigarettes, uncomplicated: Secondary | ICD-10-CM | POA: Insufficient documentation

## 2017-07-19 DIAGNOSIS — F329 Major depressive disorder, single episode, unspecified: Secondary | ICD-10-CM | POA: Insufficient documentation

## 2017-07-19 DIAGNOSIS — J45909 Unspecified asthma, uncomplicated: Secondary | ICD-10-CM | POA: Insufficient documentation

## 2017-07-19 DIAGNOSIS — T50902A Poisoning by unspecified drugs, medicaments and biological substances, intentional self-harm, initial encounter: Secondary | ICD-10-CM

## 2017-07-19 DIAGNOSIS — F332 Major depressive disorder, recurrent severe without psychotic features: Secondary | ICD-10-CM | POA: Diagnosis present

## 2017-07-19 DIAGNOSIS — Z79899 Other long term (current) drug therapy: Secondary | ICD-10-CM | POA: Insufficient documentation

## 2017-07-19 DIAGNOSIS — T43592A Poisoning by other antipsychotics and neuroleptics, intentional self-harm, initial encounter: Secondary | ICD-10-CM | POA: Insufficient documentation

## 2017-07-19 HISTORY — DX: Suicidal ideations: R45.851

## 2017-07-19 LAB — ACETAMINOPHEN LEVEL: Acetaminophen (Tylenol), Serum: <10 ug/mL — ABNORMAL LOW (ref 10–30)

## 2017-07-19 LAB — SALICYLATE LEVEL: Salicylate Lvl: <7 mg/dL (ref 2.8–30.0)

## 2017-07-19 LAB — I-STAT BETA HCG BLOOD, ED (MC, WL, AP ONLY)

## 2017-07-19 LAB — COMPREHENSIVE METABOLIC PANEL
ALK PHOS: 86 U/L (ref 38–126)
ALT: 30 U/L (ref 14–54)
AST: 45 U/L — AB (ref 15–41)
Albumin: 3.8 g/dL (ref 3.5–5.0)
Anion gap: 9 (ref 5–15)
BUN: 10 mg/dL (ref 6–20)
CALCIUM: 9.4 mg/dL (ref 8.9–10.3)
CHLORIDE: 107 mmol/L (ref 101–111)
CO2: 24 mmol/L (ref 22–32)
CREATININE: 1.03 mg/dL — AB (ref 0.44–1.00)
GFR calc Af Amer: >60 mL/min (ref >60)
GFR calc non Af Amer: >60 mL/min (ref >60)
Glucose, Bld: 105 mg/dL — ABNORMAL HIGH (ref 65–99)
Potassium: 5.1 mmol/L (ref 3.5–5.1)
Sodium: 140 mmol/L (ref 135–145)
Total Bilirubin: 0.9 mg/dL (ref 0.3–1.2)
Total Protein: 6.9 g/dL (ref 6.5–8.1)

## 2017-07-19 LAB — RAPID URINE DRUG SCREEN, HOSP PERFORMED
Amphetamines: POSITIVE — AB
Barbiturates: NOT DETECTED
Benzodiazepines: NOT DETECTED
Cocaine: NOT DETECTED
OPIATES: NOT DETECTED
Tetrahydrocannabinol: NOT DETECTED

## 2017-07-19 LAB — CBC
HCT: 42.6 % (ref 36.0–46.0)
HEMOGLOBIN: 14.4 g/dL (ref 12.0–15.0)
MCH: 30.1 pg (ref 26.0–34.0)
MCHC: 33.8 g/dL (ref 30.0–36.0)
MCV: 89.1 fL (ref 78.0–100.0)
PLATELETS: 305 10*3/uL (ref 150–400)
RBC: 4.78 MIL/uL (ref 3.87–5.11)
RDW: 14 % (ref 11.5–15.5)
WBC: 6.9 10*3/uL (ref 4.0–10.5)

## 2017-07-19 LAB — CBG MONITORING, ED: GLUCOSE-CAPILLARY: 104 mg/dL — AB (ref 65–99)

## 2017-07-19 LAB — ETHANOL

## 2017-07-19 MED ORDER — ACETAMINOPHEN 325 MG PO TABS: 650.0000 mg | ORAL_TABLET | ORAL | Status: DC | PRN

## 2017-07-19 MED ORDER — HYDROCHLOROTHIAZIDE 25 MG PO TABS
25.0000 mg | ORAL_TABLET | Freq: Every day | ORAL | Status: DC
  Administered 2017-07-20: 25 mg via ORAL
  Filled 2017-07-19 (×2): qty 1

## 2017-07-19 MED ORDER — NICOTINE 21 MG/24HR TD PT24
21.0000 mg | MEDICATED_PATCH | Freq: Every day | TRANSDERMAL | Status: DC
  Administered 2017-07-20: 21 mg via TRANSDERMAL
  Filled 2017-07-19: qty 1

## 2017-07-19 MED ORDER — IBUPROFEN 800 MG PO TABS
800.0000 mg | ORAL_TABLET | Freq: Three times a day (TID) | ORAL | Status: DC | PRN
  Administered 2017-07-20: 800 mg via ORAL
  Filled 2017-07-19: qty 1

## 2017-07-19 MED ORDER — AMPHETAMINE-DEXTROAMPHETAMINE 10 MG PO TABS
10.0000 mg | ORAL_TABLET | Freq: Two times a day (BID) | ORAL | Status: DC
  Administered 2017-07-20: 10 mg via ORAL
  Filled 2017-07-19 (×3): qty 1

## 2017-07-19 MED ORDER — ESCITALOPRAM OXALATE 10 MG PO TABS
10.0000 mg | ORAL_TABLET | Freq: Every day | ORAL | Status: DC
Start: 2017-07-19 — End: 2017-07-20
  Administered 2017-07-20: 10 mg via ORAL
  Filled 2017-07-19 (×2): qty 1

## 2017-07-19 MED ORDER — ALBUTEROL SULFATE HFA 108 (90 BASE) MCG/ACT IN AERS: 1.0000 | INHALATION_SPRAY | Freq: Four times a day (QID) | RESPIRATORY_TRACT | Status: DC | PRN

## 2017-07-19 MED ORDER — SODIUM CHLORIDE 0.9 % IV BOLUS (SEPSIS)
1000.0000 mL | Freq: Once | INTRAVENOUS | Status: AC
  Administered 2017-07-19: 1000 mL via INTRAVENOUS

## 2017-07-19 MED ORDER — ONDANSETRON HCL 4 MG PO TABS: 4.0000 mg | ORAL_TABLET | Freq: Three times a day (TID) | ORAL | Status: DC | PRN

## 2017-07-19 MED ORDER — ALUM & MAG HYDROXIDE-SIMETH 200-200-20 MG/5ML PO SUSP: 30.0000 mL | Freq: Four times a day (QID) | ORAL | Status: DC | PRN

## 2017-07-19 MED ORDER — METOPROLOL TARTRATE 25 MG PO TABS
25.0000 mg | ORAL_TABLET | Freq: Every day | ORAL | Status: DC
  Administered 2017-07-20: 25 mg via ORAL
  Filled 2017-07-19 (×2): qty 1

## 2017-07-19 MED ORDER — QUETIAPINE FUMARATE 50 MG PO TABS
50.0000 mg | ORAL_TABLET | Freq: Every day | ORAL | Status: DC
  Filled 2017-07-19: qty 1

## 2017-07-19 MED ORDER — ONDANSETRON 4 MG PO TBDP: 4.0000 mg | ORAL_TABLET | Freq: Once | ORAL | Status: DC

## 2017-07-19 NOTE — ED Notes (Signed)
Pt A&O x 3, no distress noted, calm & cooperative.  Resting at present,  Monitoring for safety, Q 15 min checks in effect.

## 2017-07-19 NOTE — ED Triage Notes (Addendum)
EMS reports pt took approx 25 tabs 50 mg Seroquel last night around 11pm followed with 4 beers. Pt did this with the intent to harm self due to poor coping abilities since mother's death in May.

## 2017-07-19 NOTE — ED Provider Notes (Signed)
[facility]-EMERGENCY DEPT Provider Note   CSN: [id number] Arrival date & time: 07/19/17  4098     History   Chief Complaint Chief Complaint  Patient presents with  . Drug Overdose  . Suicidal    HPI [name] is a 37 y.o. female.  HPI  37 year old female presents after taking around 25 or more 50 mg Seroquel tablets.  She states this was around midnight last night.  She also drank 2 beers.  She denies any other medicine ingestion.  No illicit drug use.  She states that in 2019-05-31her mom died and now that is getting close to that anniversary she is been having a hard time.  She states she is not exactly sure why she took it and states that she was not feeling suicidal or trying to kill herself.  She states that she has had a chronic sore throat since October but currently her mouth feels dry.  She has a little bit of a headache and some mild upper abdominal pain but no vomiting.  No chest pain.  Past Medical History:  Diagnosis Date  . Anxiety   . Asthma   . Depression    doing good, not on meds now  . GERD (gastroesophageal reflux disease)   . Headache    migraines  . History of kidney stones   . Hypertension   . Kidney stones   . Missed abortion 10/02/2013  . Ovarian cyst   . Pregnancy induced hypertension   . Suicidal intent   . Vaginal Pap smear, abnormal    bx, f/u ok    Patient Active Problem List   Diagnosis Date Noted  . Acute sinusitis with symptoms > 10 days 05/29/2017  . Postpartum complication 06/13/2015  . Normal labor and delivery 06/06/2015  . Indication for care in labor or delivery 06/06/2015  . S/P cesarean section 06/06/2015  . Asthma, chronic 07/29/2014  . Depression 07/29/2014  . Smoking 07/29/2014  . Essential hypertension, benign 07/29/2014  . Missed abortion 10/02/2013    Past Surgical History:  Procedure Laterality Date  . CESAREAN SECTION    . CESAREAN SECTION N/A 06/06/2015   Procedure: CESAREAN  SECTION;  Surgeon: Kathreen Cosier, MD;  Location: [facility];  Service: Obstetrics;  Laterality: N/A;  . CHOLECYSTECTOMY N/A 08/23/2016   Procedure: LAPAROSCOPIC CHOLECYSTECTOMY;  Surgeon: Berna Bue, MD;  Location: [facility] OR;  Service: General;  Laterality: N/A;  . DILATION AND CURETTAGE OF UTERUS    . DILATION AND EVACUATION N/A 10/04/2013   Procedure: DILATATION AND EVACUATION;  Surgeon: Sherian Rein, MD;  Location: [facility];  Service: Gynecology;  Laterality: N/A;  [place] in room   . TONSILLECTOMY    . TUBAL LIGATION      OB History    Gravida Para Term Preterm AB Living   4 2 2  0 2 2   SAB TAB Ectopic Multiple Live Births   1 1 0 0 2       Home Medications    Prior to Admission medications   Medication Sig Start Date End Date Taking? Authorizing Provider  albuterol (PROVENTIL HFA;VENTOLIN HFA) 108 (90 Base) MCG/ACT inhaler Inhale 1-2 puffs into the lungs every 6 (six) hours as needed for wheezing or shortness of breath. 05/29/17  Yes Wieters, Hallie C, PA-C  amphetamine-dextroamphetamine (ADDERALL) 10 MG tablet Take 1 tablet by mouth 2 (two) times daily. 07/10/17  Yes [provider]  escitalopram (LEXAPRO) 10 MG tablet Take  10 mg by mouth daily.   Yes [provider]  hydrochlorothiazide (HYDRODIURIL) 25 MG tablet Take 1 tablet (25 mg total) by mouth daily. 12/12/16  Yes Georgian Co M, PA-C  ibuprofen (ADVIL,MOTRIN) 200 MG tablet Take 800 mg by mouth every 8 (eight) hours as needed (for pain/headache.).   Yes [provider]  metoprolol tartrate (LOPRESSOR) 25 MG tablet Take 1 tablet (25 mg total) by mouth 2 (two) times daily. Patient taking differently: Take 25 mg by mouth daily.  12/12/16  Yes Georgian Co M, PA-C  QUEtiapine (SEROQUEL) 50 MG tablet Take 50 mg by mouth at bedtime.   Yes [provider]  amoxicillin-clavulanate (AUGMENTIN) 875-125 MG tablet Take 1 tablet by mouth 2 (two) times daily. Patient not taking: Reported on  07/19/2017 06/15/17   Hedges, Tinnie Gens, PA-C  budesonide-formoterol [facility]) 160-4.5 MCG/ACT inhaler Inhale 2 puffs into the lungs 2 (two) times daily. Patient not taking: Reported on 05/29/2017 05/24/17   Marcine Matar, MD  fluticasone [facility]) 50 MCG/ACT nasal spray Place 2 sprays into both nostrils daily. Patient not taking: Reported on 07/19/2017 12/12/16   Anders Simmonds, PA-C    Family History Family History  Problem Relation Age of Onset  . Hearing loss Mother   . Asthma Mother   . Asthma Father   . Diabetes Son   . Cancer Maternal Grandfather        bone  . Liver disease Maternal Grandfather   . Asthma Sister   . Lupus Sister     Social History Social History   Tobacco Use  . Smoking status: Current Some Day Smoker    Packs/day: 0.25    Years: 15.00    Pack years: 3.75    Types: Cigarettes  . Smokeless tobacco: Never Used  . Tobacco comment: Smoking .5 ppd  Substance Use Topics  . Alcohol use: Yes    Alcohol/week: 2.4 oz    Types: 4 Cans of beer per week  . Drug use: No     Allergies   Penicillins and Norvasc [amlodipine besylate]   Review of Systems Review of Systems  Constitutional: Negative for fever.  HENT: Positive for sore throat.        Dry mouth  Respiratory: Negative for shortness of breath.   Cardiovascular: Negative for chest pain.  Gastrointestinal: Positive for abdominal pain. Negative for vomiting.  Neurological: Positive for headaches.  Psychiatric/Behavioral: Negative for suicidal ideas.  All other systems reviewed and are negative.    Physical Exam Updated Vital Signs BP 140/90   Pulse 92   Temp 98.2 F (36.8 C) (Oral)   Resp 16   SpO2 96%   Physical Exam  Constitutional: She is oriented to person, place, and time. She appears well-developed and well-nourished.  HENT:  Head: Normocephalic and atraumatic.  Right Ear: External ear normal.  Left Ear: External ear normal.  Nose: Nose normal.  Mouth/Throat: Uvula is  midline. Mucous membranes are dry. No oropharyngeal exudate, posterior oropharyngeal edema or posterior oropharyngeal erythema.  Dry lips, tongue and oropharynx  Eyes: Right eye exhibits no discharge. Left eye exhibits no discharge.  Cardiovascular: Regular rhythm and normal heart sounds. Tachycardia present.  Pulmonary/Chest: Effort normal and breath sounds normal.  Abdominal: Soft. There is no tenderness.  Neurological: She is alert and oriented to person, place, and time.  Sleepy but easily arousable and otherwise awake, alert and oriented. CN 3-12 grossly intact. 5/5 strength in all 4 extremities. Grossly normal sensation.  Skin: Skin  is warm and dry.  Nursing note and vitals reviewed.    ED Treatments / Results  Labs (all labs ordered are listed, but only abnormal results are displayed) Labs Reviewed  COMPREHENSIVE METABOLIC PANEL - Abnormal; Notable for the following components:      Result Value   Glucose, Bld 105 (*)    Creatinine, Ser 1.03 (*)    AST 45 (*)    All other components within normal limits  ACETAMINOPHEN LEVEL - Abnormal; Notable for the following components:   Acetaminophen (Tylenol), Serum <10 (*)    All other components within normal limits  CBG MONITORING, ED - Abnormal; Notable for the following components:   Glucose-Capillary 104 (*)    All other components within normal limits  ETHANOL  SALICYLATE LEVEL  CBC  RAPID URINE DRUG SCREEN, HOSP PERFORMED  I-STAT BETA HCG BLOOD, ED ([facility], [facility], [facility] ONLY)    EKG  EKG Interpretation  Date/Time:  Saturday July 19 2017 08:25:07 EST Ventricular Rate:  97 PR Interval:    QRS Duration: 80 QT Interval:  352 QTC Calculation: 448 R Axis:   65 Text Interpretation:  Normal sinus rhythm RAE, consider biatrial enlargement no significant change since July 2018 Confirmed by Pricilla LovelessGoldston, Renee Erb [phone]([id number]) on 07/19/2017 9:19:46 AM Also confirmed by Pricilla LovelessGoldston, Bev Drennen [phone]), editor Sheppard EvensSimpson, Miranda ([phone])  on 07/19/2017 10:02:47 AM       Radiology No results found.  Procedures Procedures (including critical care time)  Medications Ordered in ED Medications  acetaminophen (TYLENOL) tablet 650 mg (not administered)  ondansetron (ZOFRAN) tablet 4 mg (not administered)  alum & mag hydroxide-simeth (MAALOX/MYLANTA) 200-200-20 MG/5ML suspension 30 mL (not administered)  nicotine (NICODERM CQ - dosed in mg/24 hours) patch 21 mg (not administered)  albuterol (PROVENTIL HFA;VENTOLIN HFA) 108 (90 Base) MCG/ACT inhaler 1-2 puff (not administered)  amphetamine-dextroamphetamine (ADDERALL) tablet 10 mg (10 mg Oral Refused 07/19/17 1049)  escitalopram (LEXAPRO) tablet 10 mg (10 mg Oral Refused 07/19/17 1049)  hydrochlorothiazide (HYDRODIURIL) tablet 25 mg (25 mg Oral Refused 07/19/17 1050)  ibuprofen (ADVIL,MOTRIN) tablet 800 mg (not administered)  metoprolol tartrate (LOPRESSOR) tablet 25 mg (25 mg Oral Refused 07/19/17 1050)  QUEtiapine (SEROQUEL) tablet 50 mg (not administered)  sodium chloride 0.9 % bolus 1,000 mL (0 mLs Intravenous Stopped 07/19/17 1149)     Initial Impression / Assessment and Plan / ED Course  I have reviewed the triage vital signs and the nursing notes.  Pertinent labs & imaging results that were available during my care of the patient were reviewed by me and considered in my medical decision making (see chart for details).     Besides tachycardia that has improved, the patient's vital signs and workup are unremarkable.  Given her ingestion was at midnight, she is now medically stable for psychiatric consultation and disposition.  Final Clinical Impressions(s) / ED Diagnoses   Final diagnoses:  Intentional drug overdose, initial encounter [facility](HCC)    ED Discharge Orders    None       Pricilla LovelessGoldston, Aedin Jeansonne, MD 07/19/17 [phone]

## 2017-07-19 NOTE — ED Notes (Signed)
[facility] adv[facility] if pts Tylenol, Salicylate and LFT return normal, she is considered to be medically cleared d/t the delay in arriving to the ED. No other recommendations were given

## 2017-07-19 NOTE — ED Notes (Signed)
Pt awakes to verbal stimuli. Pt answers appropriately. Pt has slurred speech, calm and cooperative. Pt VSS

## 2017-07-19 NOTE — ED Notes (Signed)
Bed: RESA Expected date: 07/19/17 Expected time: 8:14 AM Means of arrival: Ambulance Comments: OD

## 2017-07-19 NOTE — [facility] Assessment (Signed)
[facility] Assessment Progress Note  Case was staffed with Parks FNP who recommended patient be observed and monitored for safety. Patient will be seen by psychiatry in the a.m.     

## 2017-07-19 NOTE — BH Assessment (Signed)
Assessment Note  [name] is an 37 y.o. female who presents to the ED for concerns of overdose. Patient states that after getting angry at her significant other she took 20 tablets of Seroquel 50mg  and then became scared that she took too many pills and was advised by poison control to go to the ED. Patient continues to voice some passive S/I but minimizes the incident that occurred earlier this date.  Patient denies any symptoms at this time related to taking that many tablets. Patient notes a history of frequent verbal and emotional problems with her significant other. Patient states that she hopes that the ED can help her make sure she is safe. Patient endorses a history of depression that she is currently being treated with Lexapro 25mg , daytime fatigue that she is getting Adderall 10mg  BID, and Seroquel 50mg  for sleep at HS. Patient receives all of her medication from her PCP at the [facility]. Patient notes a consistent 3-4hrs of sleep each evening denies naps or daytime fatigue. She does note easy frustration, denies grandiosity. Admits that doing days of increased mood that she feels that her brain is sped up with lost of ideas and that she starts a lot of projects but does not get them all done. Denies increased libido. Admits that during days of increased mood she is more talkative than normal. Patient denies suicidal ideation, homicidal ideation, thoughts of hurting herself or others and feels safe at home. Denies H/I or AVH. Patient does drink alcohol and has about 4-5 beers a week. Patient also smokes 1/2pk of cigarettes a day since the age of 59. Denies any recreational drug use. Patient drinks coke and coffee. She has at least 5 cups of coffee a day. Patient is oriented to time/place and denies any legal. Patient denies any prior inpatient admissions associated with any MH diagnoses. Case was staffed with Arville Care FNP who recommended patient be observed and monitored for safety. Patient  will be seen by psychiatry in the a.m.    Diagnosis: F33.2 MDD recurrent severe without psychotic features   Past Medical History:  Past Medical History:  Diagnosis Date  . Anxiety   . Asthma   . Depression    doing good, not on meds now  . GERD (gastroesophageal reflux disease)   . Headache    migraines  . History of kidney stones   . Hypertension   . Kidney stones   . Missed abortion 10/02/2013  . Ovarian cyst   . Pregnancy induced hypertension   . Suicidal intent   . Vaginal Pap smear, abnormal    bx, f/u ok    Past Surgical History:  Procedure Laterality Date  . CESAREAN SECTION    . CESAREAN SECTION N/A 06/06/2015   Procedure: CESAREAN SECTION;  Surgeon: Kathreen Cosier, MD;  Location: [facility] ORS;  Service: Obstetrics;  Laterality: N/A;  . CHOLECYSTECTOMY N/A 08/23/2016   Procedure: LAPAROSCOPIC CHOLECYSTECTOMY;  Surgeon: Berna Bue, MD;  Location: [facility] OR;  Service: General;  Laterality: N/A;  . DILATION AND CURETTAGE OF UTERUS    . DILATION AND EVACUATION N/A 10/04/2013   Procedure: DILATATION AND EVACUATION;  Surgeon: Sherian Rein, MD;  Location: [facility] ORS;  Service: Gynecology;  Laterality: N/A;  [place] in room   . TONSILLECTOMY    . TUBAL LIGATION      Family History:  Family History  Problem Relation Age of Onset  . Hearing loss Mother   . Asthma Mother   . Asthma  Father   . Diabetes Son   . Cancer Maternal Grandfather        bone  . Liver disease Maternal Grandfather   . Asthma Sister   . Lupus Sister     Social History:  reports that she has been smoking cigarettes.  She has a 3.75 pack-year smoking history. she has never used smokeless tobacco. She reports that she drinks about 2.4 oz of alcohol per week. She reports that she does not use drugs.  Additional Social History:  Alcohol / Drug Use Pain Medications: See MAR Prescriptions: See MAR Over the Counter: See MAR History of alcohol / drug use?: No history of alcohol / drug abuse Longest  period of sobriety (when/how long): Unknown Negative Consequences of Use: (Denies) Withdrawal Symptoms: (Denies)  CIWA: CIWA-Ar BP: 140/90 Pulse Rate: 92 COWS:    Allergies:  Allergies  Allergen Reactions  . Penicillins Nausea And Vomiting and Other (See Comments)    UNSPECIFIED REACTION  Has patient had a PCN reaction causing immediate rash, facial/tongue/throat swelling, SOB or lightheadedness with hypotension:No Has patient had a PCN reaction causing severe rash involving mucus membranes or skin necrosis:No Has patient had a PCN reaction that REACTION THAT REQUIRED HOSPITALIZATION:  #  #  #  YES  #  #  #  Has patient had a PCN reaction occurring within the last 10 years: #  #  #  YES  #  #  #   . Norvasc [Amlodipine Besylate] Other (See Comments)    Hypersensitivity? Tip of tongue numb and flushing.    Home Medications:  (Not in a hospital admission)  OB/GYN Status:  No LMP recorded.  General Assessment Data Location of Assessment: [facility] ED TTS Assessment: In system Is this a Tele or Face-to-Face Assessment?: Face-to-Face Is this an Initial Assessment or a Re-assessment for this encounter?: Initial Assessment Marital status: Single Maiden name: (NA) Is patient pregnant?: No Pregnancy Status: No Living Arrangements: Spouse/significant other Can pt return to current living arrangement?: Yes Admission Status: Voluntary Is patient capable of signing voluntary admission?: Yes Referral Source: Self/Family/Friend Insurance type: Medicaid  Medical Screening Exam [facility] Walk-in ONLY) Medical Exam completed: Yes  Crisis Care Plan Living Arrangements: Spouse/significant other Legal Guardian: (NA) Name of Psychiatrist: None Name of Therapist: None  Education Status Is patient currently in school?: No Current Grade: (NA) Highest grade of school patient has completed: (Some college) Name of school: (NA) Contact person: (NA)  Risk to self with the past 6 months Suicidal  Ideation: Yes-Currently Present Has patient been a risk to self within the past 6 months prior to admission? : No Suicidal Intent: Yes-Currently Present Has patient had any suicidal intent within the past 6 months prior to admission? : No Is patient at risk for suicide?: Yes Suicidal Plan?: Yes-Currently Present Has patient had any suicidal plan within the past 6 months prior to admission? : Yes Specify Current Suicidal Plan: Overdose Access to Means: Yes Specify Access to Suicidal Means: Pt has medication/s What has been your use of drugs/alcohol within the last 12 months?: Denies current use Previous Attempts/Gestures: No How many times?: 0 Other Self Harm Risks: NA Triggers for Past Attempts: Unknown Intentional Self Injurious Behavior: None Family Suicide History: No Recent stressful life event(s): Other (Comment)(Death of mother) Persecutory voices/beliefs?: No Depression: No Depression Symptoms: (NA) Substance abuse history and/or treatment for substance abuse?: Yes Suicide prevention information given to non-admitted patients: Not applicable  Risk to Others within the past 6 months  Homicidal Ideation: No Does patient have any lifetime risk of violence toward others beyond the six months prior to admission? : No Thoughts of Harm to Others: No Current Homicidal Intent: No Current Homicidal Plan: No Access to Homicidal Means: No Identified Victim: NA History of harm to others?: No Assessment of Violence: None Noted Violent Behavior Description: NA Does patient have access to weapons?: No Criminal Charges Pending?: No Does patient have a court date: No Is patient on probation?: No  Psychosis Hallucinations: None noted Delusions: None noted  Mental Status Report Appearance/Hygiene: In scrubs Eye Contact: Good Motor Activity: Freedom of movement Speech: Logical/coherent Level of Consciousness: Alert Mood: Euthymic Affect: Appropriate to circumstance Anxiety Level:  None Thought Processes: Coherent, Relevant Judgement: Impaired Orientation: Person, Place, Time Obsessive Compulsive Thoughts/Behaviors: None  Cognitive Functioning Concentration: Normal Memory: Recent Intact, Remote Intact IQ: Average Insight: Fair Impulse Control: Poor Appetite: Fair Weight Loss: 0 Weight Gain: 0 Sleep: Decreased Total Hours of Sleep: 3(3 to 4 hours) Vegetative Symptoms: None  ADLScreening [facility] Assessment Services) Patient's cognitive ability adequate to safely complete daily activities?: Yes Patient able to express need for assistance with ADLs?: Yes Independently performs ADLs?: Yes (appropriate for developmental age)  Prior Inpatient Therapy Prior Inpatient Therapy: No Prior Therapy Dates: NA Prior Therapy Facilty/Provider(s): NA Reason for Treatment: NA  Prior Outpatient Therapy Prior Outpatient Therapy: No Prior Therapy Dates: NA Prior Therapy Facilty/Provider(s): NA Reason for Treatment: NA Does patient have an ACCT team?: No Does patient have Intensive In-House Services?  : No Does patient have [facility] services? : No Does patient have P4CC services?: No  ADL Screening (condition at time of admission) Patient's cognitive ability adequate to safely complete daily activities?: Yes Is the patient deaf or have difficulty hearing?: No Does the patient have difficulty seeing, even when wearing glasses/contacts?: No Does the patient have difficulty concentrating, remembering, or making decisions?: No Patient able to express need for assistance with ADLs?: Yes Does the patient have difficulty dressing or bathing?: No Independently performs ADLs?: Yes (appropriate for developmental age) Does the patient have difficulty walking or climbing stairs?: No Weakness of Legs: None Weakness of Arms/Hands: None  Home Assistive Devices/Equipment Home Assistive Devices/Equipment: None  Therapy Consults (therapy consults require a physician order) PT  Evaluation Needed: No OT Evalulation Needed: No SLP Evaluation Needed: No Abuse/Neglect Assessment (Assessment to be complete while patient is alone) Physical Abuse: Denies Verbal Abuse: Denies Sexual Abuse: Denies Exploitation of patient/patient's resources: Denies Self-Neglect: Denies Values / Beliefs Cultural Requests During Hospitalization: None Spiritual Requests During Hospitalization: None Consults Spiritual Care Consult Needed: No Social Work Consult Needed: No Merchant navy officerAdvance Directives (For Healthcare) Does Patient Have a Medical Advance Directive?: No Would patient like information on creating a medical advance directive?: No - Patient declined    Additional Information 1:1 In Past 12 Months?: No CIRT Risk: No Elopement Risk: No Does patient have medical clearance?: No     Disposition: Case was staffed with Arville CareParks FNP who recommended patient be observed and monitored for safety. Patient will be seen by psychiatry in the a.m.    Disposition Initial Assessment Completed for this Encounter: Yes Disposition of Patient: Other dispositions Other disposition(s): Other (Comment)(Observe and monitor for safety)  On Site Evaluation by:   Reviewed with Physician:    Alfredia Fergusonavid L Adie Vilar 07/19/2017 2:14 PM

## 2017-07-19 NOTE — ED Notes (Signed)
Patient in room eating a sandwich, alert and oriented.  Denies SI, AVH.

## 2017-07-19 NOTE — ED Notes (Signed)
WICK PUT IN PLACE FOR URINE COLLECTION 

## 2017-07-20 ENCOUNTER — Inpatient Hospital Stay (HOSPITAL_COMMUNITY)
Admission: AD | Admit: 2017-07-20 | Discharge: 2017-07-23 | DRG: 885 | Disposition: A | Discharge status: Home / Self Care | Payer: Medicaid Other | Source: Intra-hospital | Attending: Psychiatry | Admitting: Psychiatry

## 2017-07-20 ENCOUNTER — Other Ambulatory Visit: Payer: Self-pay

## 2017-07-20 ENCOUNTER — Encounter (HOSPITAL_COMMUNITY): Payer: Self-pay

## 2017-07-20 DIAGNOSIS — F909 Attention-deficit hyperactivity disorder, unspecified type: Secondary | ICD-10-CM | POA: Diagnosis present

## 2017-07-20 DIAGNOSIS — Z87442 Personal history of urinary calculi: Secondary | ICD-10-CM | POA: Diagnosis not present

## 2017-07-20 DIAGNOSIS — Z833 Family history of diabetes mellitus: Secondary | ICD-10-CM | POA: Diagnosis not present

## 2017-07-20 DIAGNOSIS — F17210 Nicotine dependence, cigarettes, uncomplicated: Secondary | ICD-10-CM | POA: Diagnosis present

## 2017-07-20 DIAGNOSIS — Z791 Long term (current) use of non-steroidal anti-inflammatories (NSAID): Secondary | ICD-10-CM

## 2017-07-20 DIAGNOSIS — T43592A Poisoning by other antipsychotics and neuroleptics, intentional self-harm, initial encounter: Secondary | ICD-10-CM | POA: Diagnosis not present

## 2017-07-20 DIAGNOSIS — Z79899 Other long term (current) drug therapy: Secondary | ICD-10-CM | POA: Diagnosis not present

## 2017-07-20 DIAGNOSIS — Z915 Personal history of self-harm: Secondary | ICD-10-CM | POA: Diagnosis not present

## 2017-07-20 DIAGNOSIS — T1491XA Suicide attempt, initial encounter: Secondary | ICD-10-CM

## 2017-07-20 DIAGNOSIS — R4587 Impulsiveness: Secondary | ICD-10-CM

## 2017-07-20 DIAGNOSIS — Z832 Family history of diseases of the blood and blood-forming organs and certain disorders involving the immune mechanism: Secondary | ICD-10-CM

## 2017-07-20 DIAGNOSIS — Z888 Allergy status to other drugs, medicaments and biological substances status: Secondary | ICD-10-CM

## 2017-07-20 DIAGNOSIS — Z818 Family history of other mental and behavioral disorders: Secondary | ICD-10-CM

## 2017-07-20 DIAGNOSIS — F322 Major depressive disorder, single episode, severe without psychotic features: Secondary | ICD-10-CM | POA: Diagnosis present

## 2017-07-20 DIAGNOSIS — Z825 Family history of asthma and other chronic lower respiratory diseases: Secondary | ICD-10-CM | POA: Diagnosis not present

## 2017-07-20 DIAGNOSIS — Z808 Family history of malignant neoplasm of other organs or systems: Secondary | ICD-10-CM

## 2017-07-20 DIAGNOSIS — I10 Essential (primary) hypertension: Secondary | ICD-10-CM | POA: Diagnosis present

## 2017-07-20 DIAGNOSIS — Z9049 Acquired absence of other specified parts of digestive tract: Secondary | ICD-10-CM | POA: Diagnosis not present

## 2017-07-20 DIAGNOSIS — Z630 Problems in relationship with spouse or partner: Secondary | ICD-10-CM | POA: Diagnosis not present

## 2017-07-20 DIAGNOSIS — J45909 Unspecified asthma, uncomplicated: Secondary | ICD-10-CM | POA: Diagnosis present

## 2017-07-20 DIAGNOSIS — Z23 Encounter for immunization: Secondary | ICD-10-CM

## 2017-07-20 DIAGNOSIS — Y900 Blood alcohol level of less than 20 mg/100 ml: Secondary | ICD-10-CM | POA: Diagnosis not present

## 2017-07-20 DIAGNOSIS — F1010 Alcohol abuse, uncomplicated: Secondary | ICD-10-CM

## 2017-07-20 DIAGNOSIS — Z9851 Tubal ligation status: Secondary | ICD-10-CM | POA: Diagnosis not present

## 2017-07-20 DIAGNOSIS — K219 Gastro-esophageal reflux disease without esophagitis: Secondary | ICD-10-CM | POA: Diagnosis present

## 2017-07-20 DIAGNOSIS — F1099 Alcohol use, unspecified with unspecified alcohol-induced disorder: Secondary | ICD-10-CM | POA: Diagnosis not present

## 2017-07-20 DIAGNOSIS — F332 Major depressive disorder, recurrent severe without psychotic features: Secondary | ICD-10-CM | POA: Diagnosis present

## 2017-07-20 DIAGNOSIS — F419 Anxiety disorder, unspecified: Secondary | ICD-10-CM | POA: Diagnosis present

## 2017-07-20 DIAGNOSIS — Z880 Allergy status to penicillin: Secondary | ICD-10-CM

## 2017-07-20 MED ORDER — INFLUENZA VAC SPLIT QUAD 0.5 ML IM SUSY
0.5000 mL | PREFILLED_SYRINGE | INTRAMUSCULAR | Status: AC
  Administered 2017-07-21: 0.5 mL via INTRAMUSCULAR
  Filled 2017-07-20: qty 0.5

## 2017-07-20 MED ORDER — ZIPRASIDONE MESYLATE 20 MG IM SOLR: 20.0000 mg | INTRAMUSCULAR | Status: DC | PRN

## 2017-07-20 MED ORDER — AMPHETAMINE-DEXTROAMPHETAMINE 10 MG PO TABS
10.0000 mg | ORAL_TABLET | Freq: Two times a day (BID) | ORAL | Status: DC
  Administered 2017-07-21 – 2017-07-22 (×3): 10 mg via ORAL
  Filled 2017-07-20 (×3): qty 1

## 2017-07-20 MED ORDER — HYDROXYZINE HCL 25 MG PO TABS
25.0000 mg | ORAL_TABLET | Freq: Three times a day (TID) | ORAL | Status: DC | PRN
  Administered 2017-07-21 – 2017-07-23 (×7): 25 mg via ORAL
  Filled 2017-07-20 (×7): qty 1

## 2017-07-20 MED ORDER — ALUM & MAG HYDROXIDE-SIMETH 200-200-20 MG/5ML PO SUSP: 30.0000 mL | ORAL | Status: DC | PRN

## 2017-07-20 MED ORDER — NICOTINE 21 MG/24HR TD PT24
21.0000 mg | MEDICATED_PATCH | Freq: Every day | TRANSDERMAL | Status: DC
  Filled 2017-07-20: qty 1

## 2017-07-20 MED ORDER — TRAZODONE HCL 50 MG PO TABS
50.0000 mg | ORAL_TABLET | Freq: Every evening | ORAL | Status: DC | PRN
  Administered 2017-07-20 – 2017-07-22 (×3): 50 mg via ORAL
  Filled 2017-07-20 (×4): qty 1

## 2017-07-20 MED ORDER — QUETIAPINE FUMARATE 50 MG PO TABS
50.0000 mg | ORAL_TABLET | Freq: Every day | ORAL | Status: DC
  Administered 2017-07-21: 50 mg via ORAL
  Filled 2017-07-20 (×4): qty 1

## 2017-07-20 MED ORDER — OLANZAPINE 5 MG PO TBDP: 5.0000 mg | ORAL_TABLET | Freq: Three times a day (TID) | ORAL | Status: DC | PRN

## 2017-07-20 MED ORDER — ONDANSETRON HCL 4 MG PO TABS: 4.0000 mg | ORAL_TABLET | Freq: Three times a day (TID) | ORAL | Status: DC | PRN

## 2017-07-20 MED ORDER — ALBUTEROL SULFATE HFA 108 (90 BASE) MCG/ACT IN AERS
1.0000 | INHALATION_SPRAY | Freq: Four times a day (QID) | RESPIRATORY_TRACT | Status: DC | PRN
  Administered 2017-07-21 – 2017-07-23 (×2): 2 via RESPIRATORY_TRACT
  Filled 2017-07-20: qty 6.7

## 2017-07-20 MED ORDER — METOPROLOL TARTRATE 25 MG PO TABS
25.0000 mg | ORAL_TABLET | Freq: Every day | ORAL | Status: DC
  Administered 2017-07-21 – 2017-07-23 (×3): 25 mg via ORAL
  Filled 2017-07-20 (×5): qty 1

## 2017-07-20 MED ORDER — ESCITALOPRAM OXALATE 10 MG PO TABS
10.0000 mg | ORAL_TABLET | Freq: Every day | ORAL | Status: DC
  Administered 2017-07-21 – 2017-07-23 (×3): 10 mg via ORAL
  Filled 2017-07-20 (×5): qty 1

## 2017-07-20 MED ORDER — LORAZEPAM 1 MG PO TABS: 1.0000 mg | ORAL_TABLET | ORAL | Status: DC | PRN

## 2017-07-20 MED ORDER — ACETAMINOPHEN 325 MG PO TABS
650.0000 mg | ORAL_TABLET | Freq: Four times a day (QID) | ORAL | Status: DC | PRN
  Administered 2017-07-21 (×2): 650 mg via ORAL
  Filled 2017-07-20 (×2): qty 2

## 2017-07-20 MED ORDER — MAGNESIUM HYDROXIDE 400 MG/5ML PO SUSP: 30.0000 mL | Freq: Every day | ORAL | Status: DC | PRN

## 2017-07-20 MED ORDER — HYDROCHLOROTHIAZIDE 25 MG PO TABS
25.0000 mg | ORAL_TABLET | Freq: Every day | ORAL | Status: DC
  Administered 2017-07-21 – 2017-07-23 (×3): 25 mg via ORAL
  Filled 2017-07-20 (×5): qty 1

## 2017-07-20 MED ORDER — NICOTINE POLACRILEX 2 MG MT GUM
2.0000 mg | CHEWING_GUM | OROMUCOSAL | Status: DC | PRN
  Administered 2017-07-21 – 2017-07-23 (×7): 2 mg via ORAL
  Filled 2017-07-20 (×2): qty 1
  Filled 2017-07-20: qty 10
  Filled 2017-07-20 (×3): qty 1

## 2017-07-20 NOTE — Consult Note (Addendum)
[facility] Face-to-Face Psychiatry Consult   Reason for Consult:  ED admission with intentional overdose Referring Physician:  EDP Patient Identification: [name] MRN:  [id number] Principal Diagnosis: MDD (major depressive disorder), recurrent severe, without psychosis ([place]) Diagnosis:   Patient Active Problem List   Diagnosis Date Noted  . Acute sinusitis with symptoms > 10 days [J01.90] 05/29/2017  . Postpartum complication [F81.01] 75/02/2584  . Normal labor and delivery [O80] 06/06/2015  . Indication for care in labor or delivery [O75.9] 06/06/2015  . S/P cesarean section [Z98.891] 06/06/2015  . Asthma, chronic [J45.909] 07/29/2014  . Depression [F32.9] 07/29/2014  . Smoking [F17.200] 07/29/2014  . Essential hypertension, benign [I10] 07/29/2014  . Missed abortion [O02.1] 10/02/2013    Total Time spent with patient: 30 minutes  HPI:   [name] is a 37 y.o. female patient admitted to the ED following an intentional overdose of 20 tablets of Seroquel 32m. Patient states that after getting angry at her significant other she overdosed on her Seoquel pills and then became scared that she took too many pills. Patient then called poison control and was advised by them to go to the ED for further evaluation. Since taking all of those pills, patient denies any physical, mental, or extra pyramidal symptoms related to taking that many tablets of that medication. Patient state that she has a history of frequent verbal and emotional problems with her significant other and she just became so mad that she was not thinking through what she was doing. This morning patient notes that she had a good nights sleep and is requesting to go home. Patient notes that she wants to take care of her children and go to work tomorrow. Patient denies suicidal ideation at this time, denies thoughts of wanting to hurt herself or others at this time. Patient also is unable to give supportive factors to help  to keep her from making this same decision again.  Past Psychiatric History:  MDD recurrent severe without psychotic features  Risk to Self: Suicidal Ideation: Yes-Currently Present Suicidal Intent: No Is patient at risk for suicide?: Yes Suicidal Plan?: No Specify Current Suicidal Plan: Overdose Access to Means: Yes Specify Access to Suicidal Means: Pt has medication/s What has been your use of drugs/alcohol within the last 12 months?: Denies current use How many times?: 0 Other Self Harm Risks: NA Triggers for Past Attempts: Unknown Intentional Self Injurious Behavior: None Risk to Others: Homicidal Ideation: No Thoughts of Harm to Others: No Current Homicidal Intent: No Current Homicidal Plan: No Access to Homicidal Means: No Identified Victim: NA History of harm to others?: No Assessment of Violence: None Noted Violent Behavior Description: NA Does patient have access to weapons?: No Criminal Charges Pending?: No Does patient have a court date: No Prior Inpatient Therapy: Prior Inpatient Therapy: No Prior Therapy Dates: NA Prior Therapy Facilty/Provider(s): NA Reason for Treatment: NA Prior Outpatient Therapy: Prior Outpatient Therapy: No Prior Therapy Dates: NA Prior Therapy Facilty/Provider(s): NA Reason for Treatment: NA Does patient have an ACCT team?: No Does patient have Intensive In-House Services?  : No Does patient have [facility] services? : No Does patient have P4CC services?: No  Past Medical History:  Past Medical History:  Diagnosis Date  . Anxiety   . Asthma   . Depression    doing good, not on meds now  . GERD (gastroesophageal reflux disease)   . Headache    migraines  . History of kidney stones   . Hypertension   .  Kidney stones   . Missed abortion 10/02/2013  . Ovarian cyst   . Pregnancy induced hypertension   . Suicidal intent   . Vaginal Pap smear, abnormal    bx, f/u ok    Past Surgical History:  Procedure Laterality Date  .  CESAREAN SECTION    . CESAREAN SECTION N/A 06/06/2015   Procedure: CESAREAN SECTION;  Surgeon: Frederico Hamman, MD;  Location: [facility];  Service: Obstetrics;  Laterality: N/A;  . CHOLECYSTECTOMY N/A 08/23/2016   Procedure: LAPAROSCOPIC CHOLECYSTECTOMY;  Surgeon: Clovis Riley, MD;  Location: [facility];  Service: General;  Laterality: N/A;  . DILATION AND CURETTAGE OF UTERUS    . DILATION AND EVACUATION N/A 10/04/2013   Procedure: DILATATION AND EVACUATION;  Surgeon: Janyth Contes, MD;  Location: [facility];  Service: Gynecology;  Laterality: N/A;  [place] in room   . TONSILLECTOMY    . TUBAL LIGATION     Family History:  Family History  Problem Relation Age of Onset  . Hearing loss Mother   . Asthma Mother   . Asthma Father   . Diabetes Son   . Cancer Maternal Grandfather        bone  . Liver disease Maternal Grandfather   . Asthma Sister   . Lupus Sister    Social History:  Social History   Substance and Sexual Activity  Alcohol Use Yes  . Alcohol/week: 2.4 oz  . Types: 4 Cans of beer per week     Social History   Substance and Sexual Activity  Drug Use No    Social History   Socioeconomic History  . Marital status: Single    Spouse name: None  . Number of children: None  . Years of education: None  . Highest education level: None  Social Needs  . Financial resource strain: None  . Food insecurity - worry: None  . Food insecurity - inability: None  . Transportation needs - medical: None  . Transportation needs - non-medical: None  Occupational History  . None  Tobacco Use  . Smoking status: Current Some Day Smoker    Packs/day: 0.25    Years: 15.00    Pack years: 3.75    Types: Cigarettes  . Smokeless tobacco: Never Used  . Tobacco comment: Smoking .5 ppd  Substance and Sexual Activity  . Alcohol use: Yes    Alcohol/week: 2.4 oz    Types: 4 Cans of beer per week  . Drug use: No  . Sexual activity: Yes    Partners: Male    Birth control/protection:  Surgical  Other Topics Concern  . None  Social History Narrative  . None   Allergies:   Allergies  Allergen Reactions  . Penicillins Nausea And Vomiting and Other (See Comments)    UNSPECIFIED REACTION  Has patient had a PCN reaction causing immediate rash, facial/tongue/throat swelling, SOB or lightheadedness with hypotension:No Has patient had a PCN reaction causing severe rash involving mucus membranes or skin necrosis:No Has patient had a PCN reaction that REACTION THAT REQUIRED HOSPITALIZATION:  #  #  #  YES  #  #  #  Has patient had a PCN reaction occurring within the last 10 years: #  #  #  YES  #  #  #   . Norvasc [Amlodipine Besylate] Other (See Comments)    Hypersensitivity? Tip of tongue numb and flushing.    Labs:  Results for orders placed or performed during the hospital encounter of  07/19/17 (from the past 48 hour(s))  Comprehensive metabolic panel     Status: Abnormal   Collection Time: 07/19/17  8:22 AM  Result Value Ref Range   Sodium 140 135 - 145 mmol/L   Potassium 5.1 3.5 - 5.1 mmol/L   Chloride 107 101 - 111 mmol/L   CO2 24 22 - 32 mmol/L   Glucose, Bld 105 (H) 65 - 99 mg/dL   BUN 10 6 - 20 mg/dL   Creatinine, Ser 1.03 (H) 0.44 - 1.00 mg/dL   Calcium 9.4 8.9 - 10.3 mg/dL   Total Protein 6.9 6.5 - 8.1 g/dL   Albumin 3.8 3.5 - 5.0 g/dL   AST 45 (H) 15 - 41 U/L   ALT 30 14 - 54 U/L   Alkaline Phosphatase 86 38 - 126 U/L   Total Bilirubin 0.9 0.3 - 1.2 mg/dL   GFR calc non Af Amer >60 >60 mL/min   GFR calc Af Amer >60 >60 mL/min    Comment: (NOTE) The eGFR has been calculated using the CKD EPI equation. This calculation has not been validated in all clinical situations. eGFR's persistently <60 mL/min signify possible Chronic Kidney Disease.    Anion gap 9 5 - 15    Comment: Performed at [facility], [place] [address], [place], [place] [address]  cbc     Status: None   Collection Time: 07/19/17  8:22 AM  Result Value Ref Range   WBC  6.9 4.0 - 10.5 K/uL   RBC 4.78 3.87 - 5.11 MIL/uL   Hemoglobin 14.4 12.0 - 15.0 g/dL   HCT 42.6 36.0 - 46.0 %   MCV 89.1 78.0 - 100.0 fL   MCH 30.1 26.0 - 34.0 pg   MCHC 33.8 30.0 - 36.0 g/dL   RDW 14.0 11.5 - 15.5 %   Platelets 305 150 - 400 K/uL    Comment: Performed at [facility], [place] [address] [place], [place] [address]  Ethanol     Status: None   Collection Time: 07/19/17  8:23 AM  Result Value Ref Range   Alcohol, Ethyl (B) <10 <10 mg/dL    Comment:        LOWEST DETECTABLE LIMIT FOR SERUM ALCOHOL IS 10 mg/dL FOR MEDICAL PURPOSES ONLY Performed at [place] [address], [place], [place] [address]   Salicylate level     Status: None   Collection Time: 07/19/17  8:23 AM  Result Value Ref Range   Salicylate Lvl <0.0 2.8 - 30.0 mg/dL    Comment: Performed at [facility], [place] [address] [place], [place] [address]  Acetaminophen level     Status: Abnormal   Collection Time: 07/19/17  8:23 AM  Result Value Ref Range   Acetaminophen (Tylenol), Serum <10 (L) 10 - 30 ug/mL    Comment:        THERAPEUTIC CONCENTRATIONS VARY SIGNIFICANTLY. A RANGE OF 10-30 ug/mL MAY BE AN EFFECTIVE CONCENTRATION FOR MANY PATIENTS. HOWEVER, SOME ARE BEST TREATED AT CONCENTRATIONS OUTSIDE THIS RANGE. ACETAMINOPHEN CONCENTRATIONS >150 ug/mL AT 4 HOURS AFTER INGESTION AND >50 ug/mL AT 12 HOURS AFTER INGESTION ARE OFTEN ASSOCIATED WITH TOXIC REACTIONS. Performed at [facility], [place] [address], [place],  [address]   Rapid urine drug screen (hospital performed)     Status: Abnormal   Collection Time: 07/19/17  8:23 AM  Result Value Ref Range   Opiates NONE DETECTED NONE DETECTED   Cocaine NONE DETECTED  NONE DETECTED   Benzodiazepines NONE DETECTED NONE DETECTED   Amphetamines POSITIVE (A) NONE DETECTED   Tetrahydrocannabinol NONE DETECTED NONE DETECTED   Barbiturates NONE DETECTED NONE DETECTED     Comment: (NOTE) DRUG SCREEN FOR MEDICAL PURPOSES ONLY.  IF CONFIRMATION IS NEEDED FOR ANY PURPOSE, NOTIFY LAB WITHIN 5 DAYS. LOWEST DETECTABLE LIMITS FOR URINE DRUG SCREEN Drug Class                     Cutoff (ng/mL) Amphetamine and metabolites    1000 Barbiturate and metabolites    200 Benzodiazepine                 562 Tricyclics and metabolites     300 Opiates and metabolites        300 Cocaine and metabolites        300 THC                            50 Performed at [facility], [place] [address], [place], [place] [address]   CBG monitoring, ED     Status: Abnormal   Collection Time: 07/19/17  8:39 AM  Result Value Ref Range   Glucose-Capillary 104 (H) 65 - 99 mg/dL  I-Stat beta hCG blood, ED     Status: None   Collection Time: 07/19/17  8:45 AM  Result Value Ref Range   I-stat hCG, quantitative <5.0 <5 mIU/mL   Comment 3            Comment:   GEST. AGE      CONC.  (mIU/mL)   <=1 WEEK        5 - 50     2 WEEKS       50 - 500     3 WEEKS       100 - 10,000     4 WEEKS     1,000 - 30,000        FEMALE AND NON-PREGNANT FEMALE:     LESS THAN 5 mIU/mL     Current Facility-Administered Medications  Medication Dose Route Frequency Provider Last Rate Last Dose  . acetaminophen (TYLENOL) tablet 650 mg  650 mg Oral Q4H PRN Sherwood Gambler, MD      . albuterol (PROVENTIL HFA;VENTOLIN HFA) 108 (90 Base) MCG/ACT inhaler 1-2 puff  1-2 puff Inhalation Q6H PRN Sherwood Gambler, MD      . alum & mag hydroxide-simeth (MAALOX/MYLANTA) 200-200-20 MG/5ML suspension 30 mL  30 mL Oral Q6H PRN Sherwood Gambler, MD      . amphetamine-dextroamphetamine (ADDERALL) tablet 10 mg  10 mg Oral BID Sherwood Gambler, MD   10 mg at 07/20/17 1021  . escitalopram (LEXAPRO) tablet 10 mg  10 mg Oral Daily Sherwood Gambler, MD   10 mg at 07/20/17 1021  . hydrochlorothiazide (HYDRODIURIL) tablet 25 mg  25 mg Oral Daily Sherwood Gambler, MD   25 mg at 07/20/17 1021  . ibuprofen (ADVIL,MOTRIN)  tablet 800 mg  800 mg Oral Q8H PRN Sherwood Gambler, MD      . metoprolol tartrate (LOPRESSOR) tablet 25 mg  25 mg Oral Daily Sherwood Gambler, MD   25 mg at 07/20/17 1021  . nicotine (NICODERM CQ - dosed in mg/24 hours) patch 21 mg  21 mg Transdermal Daily Sherwood Gambler, MD   21 mg at 07/20/17 1021  . ondansetron (ZOFRAN) tablet 4 mg  4 mg Oral Q8H PRN Regenia Skeeter,  Nicki Reaper, MD      . QUEtiapine (SEROQUEL) tablet 50 mg  50 mg Oral QHS Sherwood Gambler, MD       Current Outpatient Medications  Medication Sig Dispense Refill  . albuterol (PROVENTIL HFA;VENTOLIN HFA) 108 (90 Base) MCG/ACT inhaler Inhale 1-2 puffs into the lungs every 6 (six) hours as needed for wheezing or shortness of breath. 1 Inhaler 5  . amphetamine-dextroamphetamine (ADDERALL) 10 MG tablet Take 1 tablet by mouth 2 (two) times daily.  0  . escitalopram (LEXAPRO) 10 MG tablet Take 10 mg by mouth daily.    . hydrochlorothiazide (HYDRODIURIL) 25 MG tablet Take 1 tablet (25 mg total) by mouth daily. 90 tablet 3  . ibuprofen (ADVIL,MOTRIN) 200 MG tablet Take 800 mg by mouth every 8 (eight) hours as needed (for pain/headache.).    Marland Kitchen metoprolol tartrate (LOPRESSOR) 25 MG tablet Take 1 tablet (25 mg total) by mouth 2 (two) times daily. (Patient taking differently: Take 25 mg by mouth daily. ) 180 tablet 3  . QUEtiapine (SEROQUEL) 50 MG tablet Take 50 mg by mouth at bedtime.    Marland Kitchen amoxicillin-clavulanate (AUGMENTIN) 875-125 MG tablet Take 1 tablet by mouth 2 (two) times daily. (Patient not taking: Reported on 07/19/2017) 20 tablet 0  . budesonide-formoterol (SYMBICORT) 160-4.5 MCG/ACT inhaler Inhale 2 puffs into the lungs 2 (two) times daily. (Patient not taking: Reported on 05/29/2017) 10.2 Inhaler 3  . fluticasone (FLONASE) 50 MCG/ACT nasal spray Place 2 sprays into both nostrils daily. (Patient not taking: Reported on 07/19/2017) 16 g 6    Musculoskeletal: Strength & Muscle Tone: within normal limits Gait & Station: normal Patient leans:  N/A  Psychiatric Specialty Exam: Physical Exam  Constitutional: She is oriented to person, place, and time. She appears well-developed and well-nourished.  HENT:  Head: Normocephalic.  Neurological: She is alert and oriented to person, place, and time. She has normal strength.  Skin: Skin is warm and dry.  Psychiatric: She has a normal mood and affect. Her speech is normal and behavior is normal. Thought content normal. Cognition and memory are normal. She expresses impulsivity.    ROS  Blood pressure (!) 151/101, pulse 60, temperature 98.6 F (37 C), temperature source Oral, resp. rate 18, SpO2 98 %.There is no height or weight on file to calculate BMI.  General Appearance: Casual  Eye Contact:  None  Speech:  Clear and Coherent  Volume:  Decreased  Mood:  Negative  Affect:  Tearful  Thought Process:  Coherent  Orientation:  Full (Time, Place, and Person)  Thought Content:  Logical  Suicidal Thoughts:  No  Homicidal Thoughts:  No  Memory:  Immediate;   Good Recent;   Good Remote;   Good  Judgement:  Impaired  Insight:  Fair  Psychomotor Activity:  Normal  Concentration:  Concentration: Good and Attention Span: Fair  Recall:  Good  Fund of Knowledge:  Good  Language:  Good  Akathisia:  No  Handed:  Right  AIMS (if indicated):     Assets:  Communication Skills Housing Physical Health Resilience Social Support  ADL's:  Intact  Cognition:  WNL  Sleep:        Treatment Plan Summary: Daily contact with patient to assess and evaluate symptoms and progress in treatment and Medication management (see MAR)  Disposition: Recommend psychiatric Inpatient admission when medically cleared. TTS to seek placement  Ethelene Hal, NP 07/20/2017 1:40 PM   Patient seen face to face for this evaluation, case discussed with  treatment team and physician extender and formulated treatment plan. Reviewed the information documented and agree with the treatment plan.  Ambrose Finland, MD

## 2017-07-20 NOTE — [facility] Counselor (Signed)
[facility]  Pt may arrive after 1930 on 07/20/17.  [facility] will call when ready for transport.

## 2017-07-20 NOTE — ED Notes (Signed)
[facility]. Awaits transfer

## 2017-07-20 NOTE — ED Notes (Signed)
Patient left the unit ambulatory and in stable condition via law enforcement. Belongings given back to patient.

## 2017-07-20 NOTE — Tx Team (Signed)
Report given by Clair GullingNicki, RN. Pending Pt transport.

## 2017-07-20 NOTE — Progress Notes (Addendum)
This patient continues to meet inpatient criteria. CSW fax information to the following facilities:   [facility]  [facility]  [facility] [address]  Declined:  [facility]  Stacy GardnerErin Aubrianne Molyneux, [place] Emergency Room Clinical Social Worker [phone]

## 2017-07-20 NOTE — Progress Notes (Signed)
Admission Note:   [name] is an 37 y.o. female [facility], anxiety, and suicidal ideation.[facility]. Pt was anxious but was cooperative with the admission process.Patient states she took 20 tablets of Seroquel 50 mg after getting into a verbal altercation with boyfriend. Pt states "I got scared, so I call poison control". Pt states she texted her sister that night and an ambulance arrived to her home the following morning. Pt denies HI/AVH/Pain at this time. Pt current medication regimen includes: Lexapro 25mg , Adderall 10mg  BID, and Seroquel 50mg  for sleep at HS. Patient receives all of her medication from her PCP at the [facility]. Patient reports drinking alcohol;  4-5 beers a week. Patient also smokes 1/2 pk of cigarettes a day. Pt currently resides in a apartment with boyfriend and 2 young kids. Pt states stressors includes: Frequent verbal altercations with boyfriend, work, and caring for son who has type-1 diabetes. Skin was assessed and found to be clear of any abnormal marks apart from a tattoo on left arm. Pt searched and no contraband found, POC and unit policies explained and understanding verbalized. Consents obtained. Food and fluids offered, and both accepted. Pt had no additional questions or concerns. Flu vaccine ordered per Pt request. [facility], Pt states she would like to work on 1) "My anger" and 2) "My anxiety". Belongings in locker # 30.

## 2017-07-20 NOTE — ED Notes (Signed)
[facility] Burton- Emily RN

## 2017-07-20 NOTE — ED Notes (Signed)
Family at bedside. 

## 2017-07-20 NOTE — ED Notes (Signed)
[employer] called for transportation  

## 2017-07-20 NOTE — Tx Team (Signed)
Initial Treatment Plan 07/20/2017 11:30 PM [name] [id number]    PATIENT STRESSORS: Loss of mom Marital or family conflict Occupational concerns   PATIENT STRENGTHS: Active sense of humor Capable of independent living General fund of knowledge Motivation for treatment/growth Special hobby/interest Supportive family/friends Work skills   PATIENT IDENTIFIED PROBLEMS: Depression   Anxiety    At risk for suicide   "Work"  "My son is a type-1 diabetic"   "I argue with my boyfriend often"            DISCHARGE CRITERIA:  Ability to meet basic life and health needs Improved stabilization in mood, thinking, and/or behavior Verbal commitment to aftercare and medication compliance  PRELIMINARY DISCHARGE PLAN: Attend PHP/IOP Outpatient therapy Return to previous living arrangement Return to previous work or school arrangements  PATIENT/FAMILY INVOLVEMENT: This treatment plan has been presented to and reviewed with the patient, [name]. The patient have been given the opportunity to ask questions and make suggestions.  Tyrone AppleEmily  Aileen Amore, RN 07/20/2017, 11:30 PM

## 2017-07-21 DIAGNOSIS — Z818 Family history of other mental and behavioral disorders: Secondary | ICD-10-CM

## 2017-07-21 DIAGNOSIS — J45909 Unspecified asthma, uncomplicated: Secondary | ICD-10-CM

## 2017-07-21 DIAGNOSIS — Y900 Blood alcohol level of less than 20 mg/100 ml: Secondary | ICD-10-CM

## 2017-07-21 DIAGNOSIS — Z630 Problems in relationship with spouse or partner: Secondary | ICD-10-CM

## 2017-07-21 DIAGNOSIS — F1010 Alcohol abuse, uncomplicated: Secondary | ICD-10-CM

## 2017-07-21 DIAGNOSIS — I10 Essential (primary) hypertension: Secondary | ICD-10-CM

## 2017-07-21 DIAGNOSIS — T1491XA Suicide attempt, initial encounter: Secondary | ICD-10-CM

## 2017-07-21 DIAGNOSIS — T43592A Poisoning by other antipsychotics and neuroleptics, intentional self-harm, initial encounter: Secondary | ICD-10-CM

## 2017-07-21 DIAGNOSIS — Z634 Disappearance and death of family member: Secondary | ICD-10-CM

## 2017-07-21 DIAGNOSIS — F322 Major depressive disorder, single episode, severe without psychotic features: Secondary | ICD-10-CM

## 2017-07-21 MED ORDER — IBUPROFEN 600 MG PO TABS
600.0000 mg | ORAL_TABLET | Freq: Four times a day (QID) | ORAL | Status: DC | PRN
  Administered 2017-07-21 – 2017-07-23 (×4): 600 mg via ORAL
  Filled 2017-07-21 (×4): qty 1

## 2017-07-21 NOTE — [facility] Group Notes (Signed)
[facility] Group Notes:  (Nursing/MHT/Case Management/Adjunct)  Date:  07/21/2017  Time:  1030  Type of Therapy:  Nurse Education - Self Care  Participation Level:  Active  Participation Quality:  Attentive  Affect:  Appropriate  Cognitive:  Alert  Insight:  Good  Engagement in Group:  Engaged  Modes of Intervention:  Discussion, Education and Support  Summary of Progress/Problems: Patient attended group and was engaged. Identified interpersonal relationships as an area on which to work.  Merian CapronFriedman, Lalania Haseman [facility] 07/21/2017, 11:42 AM

## 2017-07-21 NOTE — Progress Notes (Signed)
Report given to Jackie, RN.

## 2017-07-21 NOTE — Progress Notes (Signed)
Patient ID: [name], female   DOB: 09-02-1980, 37 y.o.   MRN: [id number]  DAR: Pt. Denies SI/HI and A/V Hallucinations. She reports that her sleep last night was fair, her appetite is good, her energy level is normal, and her concentration is good. She rates her depression and hopelessness level 0/10. Patient does report a headache and received PRN Tylenol. She also received PRN Vistaril for some anxiety this morning she rated 2/10. Support and encouragement provided to the patient. Scheduled medications administered to patient per physician's orders. Patient is receptive and cooperative. She is seen in the milieu and is interacting with her peers. Q15 minute checks are maintained for safety.
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
## 2017-07-21 NOTE — [facility] Group Notes (Signed)
LCSW Group Therapy Note 07/21/2017 3:37 PM  Type of Therapy and Topic: Group Therapy: Overcoming Obstacles  Participation Level: Active  Description of Group:  In this group patients will be encouraged to explore what they see as obstacles to their own wellness and recovery. They will be guided to discuss their thoughts, feelings, and behaviors related to these obstacles. The group will process together ways to cope with barriers, with attention given to specific choices patients can make. Each patient will be challenged to identify changes they are motivated to make in order to overcome their obstacles. This group will be process-oriented, with patients participating in exploration of their own experiences as well as giving and receiving support and challenge from other group members.  Therapeutic Goals: 1. Patient will identify personal and current obstacles as they relate to admission. 2. Patient will identify barriers that currently interfere with their wellness or overcoming obstacles.  3. Patient will identify feelings, thought process and behaviors related to these barriers. 4. Patient will identify two changes they are willing to make to overcome these obstacles:   Summary of Patient Progress  [name] was engaged throughout and participated in today's group session. [name] contributed to the group's discussion and stated that her main obstacle currently was "getting in her own way". [name] states that she self sabotages and does not know why. [name] reports she does not know how she will overcome these obstacles currently, however plans to learn ways to do so while in the hospital.    Therapeutic Modalities:  Cognitive Behavioral Therapy Solution Focused Therapy Motivational Interviewing Relapse Prevention Therapy   Alcario DroughtJolan Amirah Goerke LCSWA Clinical Social Worker

## 2017-07-21 NOTE — [facility] Suicide Risk Assessment (Signed)
[facility]   Nursing information obtained from:    Demographic factors:    Current Mental Status:    Loss Factors:    Historical Factors:    Risk Reduction Factors:     Total Time spent with patient: 45 minutes Principal Problem: MDD Diagnosis:   Patient Active Problem List   Diagnosis Date Noted  . MDD (major depressive disorder), single episode, severe , no psychosis (HCC) [F32.2] 07/20/2017  . Acute sinusitis with symptoms > 10 days [J01.90] 05/29/2017  . Postpartum complication [O90.89] 06/13/2015  . Normal labor and delivery [O80] 06/06/2015  . Indication for care in labor or delivery [O75.9] 06/06/2015  . S/P cesarean section [Z98.891] 06/06/2015  . Asthma, chronic [J45.909] 07/29/2014  . Depression [F32.9] 07/29/2014  . Smoking [F17.200] 07/29/2014  . Essential hypertension, benign [I10] 07/29/2014  . Missed abortion [O02.1] 10/02/2013   Subjective Data:  37 y.o Caucasian female, lives with her boyfriend and their kids, employed. Background history of MDD. Presented to the ER via EMS. Had an argument with her significant other. Overdosed on 1000 mg of Seroquel. Reported to have been struggling since her mother passed last May. Drinks 4-5 beers a week. Has 3-4 hour sleep at night. Routine labs significant for slightly elevated AST. Toxicology is negative. UDS is positive for amphetamines. BAL<10 mg/dl. No past suicidal behavior, no family history of suicide, no evidence of psychosis. No evidence of mania. No cognitive impairment. No access to weapons. She is cooperative with care. She has agreed to treatment recommendations. She has agreed to communicate suicidal thoughts to staff if the thoughts becomes overwhelming.    Continued Clinical Symptoms:  Alcohol Use Disorder Identification Test Final Score (AUDIT): 4 The "Alcohol Use Disorders Identification Test", Guidelines for Use in Primary Care, Second Edition.  World Science writerHealth Organization  [facility](WHO). Score between 0-7:  no or low risk or alcohol related problems. Score between 8-15:  moderate risk of alcohol related problems. Score between 16-19:  high risk of alcohol related problems. Score 20 or above:  warrants further diagnostic evaluation for alcohol dependence and treatment.   CLINICAL FACTORS:   Depression:   Impulsivity   Musculoskeletal: Strength & Muscle Tone: within normal limits Gait & Station: normal Patient leans: N/A  Psychiatric Specialty Exam: Physical Exam  ROS  Blood pressure (!) 154/93, pulse 62, temperature 98.3 F (36.8 C), temperature source Oral, resp. rate 16, height 5\' 4"  (1.626 m), SpO2 98 %.Body mass index is 26.61 kg/m.  General Appearance: As in H&P  Eye Contact:    Speech:    Volume:    Mood:    Affect:    Thought Process:    Orientation:    Thought Content:    Suicidal Thoughts:    Homicidal Thoughts:    Memory:    Judgement:  As in H&P  Insight:    Psychomotor Activity:    Concentration:    Recall:    Fund of Knowledge:    Language:    Akathisia:    Handed:    AIMS (if indicated):     Assets:    ADL's:    Cognition:  As in H&P  Sleep:         COGNITIVE FEATURES THAT CONTRIBUTE TO RISK:  None    SUICIDE RISK:   Minimal: No identifiable suicidal ideation.  Patients presenting with no risk factors but with morbid ruminations; may be classified as minimal risk based on the severity of the depressive symptoms  PLAN OF CARE:  As in H&P  I certify that inpatient services furnished can reasonably be expected to improve the patient's condition.   Georgiann Cocker, MD 07/21/2017, 1:37 PM

## 2017-07-21 NOTE — Plan of Care (Signed)
  Progressing Safety: Ability to disclose and discuss suicidal ideas will improve 07/21/2017 1612 - Progressing by Lenord Fellersopson, Leroy Pettway Elizabeth, RN

## 2017-07-21 NOTE — Progress Notes (Signed)
Adult Psychoeducational Group Note  Date:  07/21/2017 Time:  6:52 PM  Group Topic/Focus:  Healthy Communication:   The focus of this group is to discuss communication, barriers to communication, as well as healthy ways to communicate with others.  Participation Level:  Active  Participation Quality:  Appropriate  Affect:  Appropriate  Cognitive:  Alert and Appropriate  Insight: Appropriate, Good and Improving  Engagement in Group:  Engaged  Modes of Intervention:  Activity and Discussion  Additional Comments:  Pt did attend group and participated in all group activities and discussions.  [name] R Glema Takaki 07/21/2017, 6:52 PM

## 2017-07-21 NOTE — H&P (Signed)
Psychiatric Admission Assessment Adult  Patient Identification: [name] MRN:  [id number] Date of Evaluation:  07/21/2017 Chief Complaint:  Overdose Principal Diagnosis: MDD Diagnosis:   Patient Active Problem List   Diagnosis Date Noted  . MDD (major depressive disorder), single episode, severe , no psychosis (HCC) [F32.2] 07/20/2017  . Acute sinusitis with symptoms > 10 days [J01.90] 05/29/2017  . Postpartum complication [O90.89] 06/13/2015  . Normal labor and delivery [O80] 06/06/2015  . Indication for care in labor or delivery [O75.9] 06/06/2015  . S/P cesarean section [Z98.891] 06/06/2015  . Asthma, chronic [J45.909] 07/29/2014  . Depression [F32.9] 07/29/2014  . Smoking [F17.200] 07/29/2014  . Essential hypertension, benign [I10] 07/29/2014  . Missed abortion [O02.1] 10/02/2013   History of Present Illness:  37 y.o Caucasian female, lives with her boyfriend and their kids, employed. Background history of MDD. Presented to the ER via EMS. Had an argument with her significant other. Overdosed on 1000 mg of Seroquel. Reported to have been struggling since her mother passed last May. Drinks 4-5 beers a week. Has 3-4 hour sleep at night. Routine labs significant for slightly elevated AST. Toxicology is negative. UDS is positive for amphetamines. BAL<10 mg/dl.  At interview, patient says she was doing well before the OD. Says she had an argument with her boyfriend. Says it was an ugly encounter. Patient says she was very angry and just wanted to get a reaction from him. Says she told him she was going to take an OD and he did not show any concern. Says she then went ahead and took a handful of her pills. She then told him what she did. Says she went into the bathroom and induced vomiting. She sent a message to her sister on Facebook telling her what she did. Patient says this was around midnight. They went to bed afterwards. Says around 6 AM the EMS woke her up. Her sister got the  message and called for help. Patient says she was feeling groggy then and wanted to get help. Tells me that after the OD, she had called poison control just to be sure that what she took was not lethal. Patient states OD was impulsive. It was not planned. She has never attempted suicide in the past. She did not have any death wish. Says she has her children and family to live for. She is worried that this might affect her records. Wants to work with [place] voluntarily. Hopes to be back to work soon. Admits occasional alcohol use. Does not feel she is drinking excessively. Does not feel it affects her functions. Denies use of any other substance. No synthetic substance use.  Denies any other stressors at home or work. No financial constraints. No legal issues. No associated psychosis. No evidence of mania. No overwhelming anxiety. No thoughts of harming others. No thoughts of violence. No access to weapons.   Total Time spent with patient: 1 hour  Past Psychiatric History: Followed at [facility]. She is on Lexapro 10 mg daily, Adderral 10 mg BID and Clonazepam. Depression has been controlled by out-patient care. No past inpatient admission.  No past history of mania. No past history of psychosis. No past history of suicidal behavior. No past history of violent behavior.   Is the patient at risk to self? No.  Has the patient been a risk to self in the past 6 months? No.  Has the patient been a risk to self within the distant past? No.  Is the patient a  risk to others? No.  Has the patient been a risk to others in the past 6 months? No.  Has the patient been a risk to others within the distant past? No.   Prior Inpatient Therapy:   Prior Outpatient Therapy:    Alcohol Screening: 1. How often do you have a drink containing alcohol?: 2 to 3 times a week 2. How many drinks containing alcohol do you have on a typical day when you are drinking?: 3 or 4 3. How often do you have six or more drinks on one  occasion?: Never AUDIT-C Score: 4 4. How often during the last year have you found that you were not able to stop drinking once you had started?: Never 5. How often during the last year have you failed to do what was normally expected from you becasue of drinking?: Never 6. How often during the last year have you needed a first drink in the morning to get yourself going after a heavy drinking session?: Never 7. How often during the last year have you had a feeling of guilt of remorse after drinking?: Never 8. How often during the last year have you been unable to remember what happened the night before because you had been drinking?: Never 9. Have you or someone else been injured as a result of your drinking?: No 10. Has a relative or friend or a doctor or another health worker been concerned about your drinking or suggested you cut down?: No Alcohol Use Disorder Identification Test Final Score (AUDIT): 4 Substance Abuse History in the last 12 months:  Yes.   Consequences of Substance Abuse: NA Previous Psychotropic Medications: Yes  Psychological Evaluations: Yes  Past Medical History:  Past Medical History:  Diagnosis Date  . Anxiety   . Asthma   . Depression    doing good, not on meds now  . GERD (gastroesophageal reflux disease)   . Headache    migraines  . History of kidney stones   . Hypertension   . Kidney stones   . Missed abortion 10/02/2013  . Ovarian cyst   . Pregnancy induced hypertension   . Suicidal intent   . Vaginal Pap smear, abnormal    bx, f/u ok    Past Surgical History:  Procedure Laterality Date  . CESAREAN SECTION    . CESAREAN SECTION N/A 06/06/2015   Procedure: CESAREAN SECTION;  Surgeon: Kathreen Cosier, MD;  Location: [facility];  Service: Obstetrics;  Laterality: N/A;  . CHOLECYSTECTOMY N/A 08/23/2016   Procedure: LAPAROSCOPIC CHOLECYSTECTOMY;  Surgeon: Berna Bue, MD;  Location: [facility];  Service: General;  Laterality: N/A;  . DILATION AND  CURETTAGE OF UTERUS    . DILATION AND EVACUATION N/A 10/04/2013   Procedure: DILATATION AND EVACUATION;  Surgeon: Sherian Rein, MD;  Location: [facility];  Service: Gynecology;  Laterality: N/A;  [place] in room   . TONSILLECTOMY    . TUBAL LIGATION     Family History:  Family History  Problem Relation Age of Onset  . Hearing loss Mother   . Asthma Mother   . Asthma Father   . Diabetes Son   . Cancer Maternal Grandfather        bone  . Liver disease Maternal Grandfather   . Asthma Sister   . Lupus Sister    Family Psychiatric  History: Mom suffers from depression. No family history of suicide. Tobacco Screening:   Social History:  Social History   Substance and Sexual Activity  Alcohol Use Yes  . Alcohol/week: 2.4 oz  . Types: 4 Cans of beer per week     Social History   Substance and Sexual Activity  Drug Use No    Additional Social History:       Allergies:   Allergies  Allergen Reactions  . Penicillins Nausea And Vomiting and Other (See Comments)    UNSPECIFIED REACTION  Has patient had a PCN reaction causing immediate rash, facial/tongue/throat swelling, SOB or lightheadedness with hypotension:No Has patient had a PCN reaction causing severe rash involving mucus membranes or skin necrosis:No Has patient had a PCN reaction that REACTION THAT REQUIRED HOSPITALIZATION:  #  #  #  YES  #  #  #  Has patient had a PCN reaction occurring within the last 10 years: #  #  #  YES  #  #  #   . Norvasc [Amlodipine Besylate] Other (See Comments)    Hypersensitivity? Tip of tongue numb and flushing.   Lab Results: No results found for this or any previous visit (from the past 48 hour(s)).  Blood Alcohol level:  Lab Results  Component Value Date   ETH <10 07/19/2017    Metabolic Disorder Labs:  Lab Results  Component Value Date   HGBA1C 5.2 07/29/2014   MPG 103 07/29/2014   No results found for: PROLACTIN No results found for: CHOL, TRIG, HDL, CHOLHDL, VLDL,  LDLCALC  Current Medications: Current Facility-Administered Medications  Medication Dose Route Frequency Provider Last Rate Last Dose  . acetaminophen (TYLENOL) tablet 650 mg  650 mg Oral Q6H PRN Laveda AbbeParks, Laurie Britton, NP   650 mg at 07/21/17 0835  . albuterol (PROVENTIL HFA;VENTOLIN HFA) 108 (90 Base) MCG/ACT inhaler 1-2 puff  1-2 puff Inhalation Q6H PRN Laveda AbbeParks, Laurie Britton, NP   2 puff at 07/21/17 [phone]  . alum & mag hydroxide-simeth (MAALOX/MYLANTA) 200-200-20 MG/5ML suspension 30 mL  30 mL Oral Q4H PRN Laveda AbbeParks, Laurie Britton, NP      . amphetamine-dextroamphetamine (ADDERALL) tablet 10 mg  10 mg Oral BID Laveda AbbeParks, Laurie Britton, NP   10 mg at 07/21/17 [id number]  . escitalopram (LEXAPRO) tablet 10 mg  10 mg Oral Daily Laveda AbbeParks, Laurie Britton, NP   10 mg at 07/21/17 [id number]  . hydrochlorothiazide (HYDRODIURIL) tablet 25 mg  25 mg Oral Daily Laveda AbbeParks, Laurie Britton, NP   25 mg at 07/21/17 [id number]  . hydrOXYzine (ATARAX/VISTARIL) tablet 25 mg  25 mg Oral TID PRN Laveda AbbeParks, Laurie Britton, NP   25 mg at 07/21/17 1025  . OLANZapine zydis (ZYPREXA) disintegrating tablet 5 mg  5 mg Oral Q8H PRN Laveda AbbeParks, Laurie Britton, NP       And  . LORazepam (ATIVAN) tablet 1 mg  1 mg Oral PRN Laveda AbbeParks, Laurie Britton, NP       And  . ziprasidone (GEODON) injection 20 mg  20 mg Intramuscular PRN Laveda AbbeParks, Laurie Britton, NP      . magnesium hydroxide (MILK OF MAGNESIA) suspension 30 mL  30 mL Oral Daily PRN Laveda AbbeParks, Laurie Britton, NP      . metoprolol tartrate (LOPRESSOR) tablet 25 mg  25 mg Oral Daily Laveda AbbeParks, Laurie Britton, NP   25 mg at 07/21/17 [id number]  . nicotine polacrilex (NICORETTE) gum 2 mg  2 mg Oral PRN Nira ConnBerry, Jason A, NP   2 mg at 07/21/17 1030  . ondansetron (ZOFRAN) tablet 4 mg  4 mg Oral Q8H PRN Laveda AbbeParks, Laurie Britton, NP      . QUEtiapine (SEROQUEL) tablet  50 mg  50 mg Oral QHS Laveda Abbe, NP      . traZODone (DESYREL) tablet 50 mg  50 mg Oral QHS PRN Laveda Abbe, NP   50 mg at 07/20/17 2208   PTA  Medications: Medications Prior to Admission  Medication Sig Dispense Refill Last Dose  . albuterol (PROVENTIL HFA;VENTOLIN HFA) 108 (90 Base) MCG/ACT inhaler Inhale 1-2 puffs into the lungs every 6 (six) hours as needed for wheezing or shortness of breath. 1 Inhaler 5 Past Month at Unknown time  . amoxicillin-clavulanate (AUGMENTIN) 875-125 MG tablet Take 1 tablet by mouth 2 (two) times daily. (Patient not taking: Reported on 07/19/2017) 20 tablet 0 Not Taking at Unknown time  . amphetamine-dextroamphetamine (ADDERALL) 10 MG tablet Take 1 tablet by mouth 2 (two) times daily.  0 07/18/2017 at Unknown time  . budesonide-formoterol (SYMBICORT) 160-4.5 MCG/ACT inhaler Inhale 2 puffs into the lungs 2 (two) times daily. (Patient not taking: Reported on 05/29/2017) 10.2 Inhaler 3 Not Taking at Unknown time  . escitalopram (LEXAPRO) 10 MG tablet Take 10 mg by mouth daily.   07/18/2017 at Unknown time  . fluticasone (FLONASE) 50 MCG/ACT nasal spray Place 2 sprays into both nostrils daily. (Patient not taking: Reported on 07/19/2017) 16 g 6 Not Taking at Unknown time  . hydrochlorothiazide (HYDRODIURIL) 25 MG tablet Take 1 tablet (25 mg total) by mouth daily. 90 tablet 3 07/18/2017 at Unknown time  . ibuprofen (ADVIL,MOTRIN) 200 MG tablet Take 800 mg by mouth every 8 (eight) hours as needed (for pain/headache.).   07/18/2017 at Unknown time  . metoprolol tartrate (LOPRESSOR) 25 MG tablet Take 1 tablet (25 mg total) by mouth 2 (two) times daily. (Patient taking differently: Take 25 mg by mouth daily. ) 180 tablet 3 07/18/2017 at Unknown time  . QUEtiapine (SEROQUEL) 50 MG tablet Take 50 mg by mouth at bedtime.   07/18/2017 at Unknown time    Musculoskeletal: Strength & Muscle Tone: within normal limits Gait & Station: normal Patient leans: N/A  Psychiatric Specialty Exam: Physical Exam  Constitutional: She is oriented to person, place, and time. She appears well-developed and well-nourished.  HENT:  Head: Normocephalic  and atraumatic.  Respiratory: Effort normal.  Neurological: She is alert and oriented to person, place, and time.  Psychiatric:  As above     ROS  Blood pressure (!) 139/96, pulse 92, temperature 98.3 F (36.8 C), temperature source Oral, resp. rate 16, height 5\' 4"  (1.626 m), SpO2 98 %.Body mass index is 26.61 kg/m.  General Appearance: Casually dressed, pleasant and cooperative. Appropriate behavior. Not internally distracted.   Eye Contact:  Good  Speech:  Clear and Coherent and Normal Rate  Volume:  Normal  Mood:  Says she is feeling okay  Affect:  Appropriate and Full Range  Thought Process:  Linear  Orientation:  Full (Time, Place, and Person)  Thought Content:  No delusional theme. No preoccupation with violent thoughts. No negative ruminations. No obsession.  No hallucination in any modality.   Suicidal Thoughts:  No  Homicidal Thoughts:  No  Memory:  Immediate;   Good Recent;   Good Remote;   Good  Judgement:  Fair  Insight:  Fair  Psychomotor Activity:  Normal  Concentration:  Concentration: Good and Attention Span: Good  Recall:  Good  Fund of Knowledge:  Good  Language:  Good  Akathisia:  Negative  Handed:    AIMS (if indicated):     Assets:  Communication Skills Desire for  Improvement Financial Resources/Insurance Housing Intimacy Physical Health Resilience Transportation Vocational/Educational  ADL's:  Intact  Cognition:  WNL  Sleep:       Treatment Plan Summary: Unipolar depression and ?SUD. Presented with impulsive OD. Denies any intent. Denies any lingering suicidal thoughts. Behavior precipitated by emotionally charged moment with her significant other. We have agreed to continue her home medications. We plan to gather collateral from her boyfriend and evaluate her further.   Psychiatric: MDD ?SUD  Medical: HTN Asthma  Psychosocial:  Relationship difficulties Bereavement  PLAN: 1. Increase Lexapro to 15 mg daily 2. Can resume  Seroquel 50 mg at bedtime 3. Continue home medical medications at home dose 4. Encourage unit groups and therapeutic activities 5. Monitor mood, behavior and interaction with peers 6. SW would gather collateral from her family and coordinate aftercare 7. Allow voluntary status   Observation Level/Precautions:  15 minute checks  Laboratory:    Psychotherapy:    Medications:    Consultations:    Discharge Concerns:    Estimated LOS:  Other:     Physician Treatment Plan for Primary Diagnosis: <principal problem not specified> Long Term Goal(s): Improvement in symptoms so as ready for discharge  Short Term Goals: Ability to identify changes in lifestyle to reduce recurrence of condition will improve, Ability to verbalize feelings will improve, Ability to disclose and discuss suicidal ideas, Ability to demonstrate self-control will improve, Ability to identify and develop effective coping behaviors will improve, Ability to maintain clinical measurements within normal limits will improve, Compliance with prescribed medications will improve and Ability to identify triggers associated with substance abuse/mental health issues will improve  Physician Treatment Plan for Secondary Diagnosis: Active Problems:   MDD (major depressive disorder), single episode, severe , no psychosis (HCC)  Long Term Goal(s): Improvement in symptoms so as ready for discharge  Short Term Goals: Ability to identify changes in lifestyle to reduce recurrence of condition will improve, Ability to verbalize feelings will improve, Ability to disclose and discuss suicidal ideas, Ability to demonstrate self-control will improve, Ability to identify and develop effective coping behaviors will improve, Ability to maintain clinical measurements within normal limits will improve, Compliance with prescribed medications will improve and Ability to identify triggers associated with substance abuse/mental health issues will improve  I  certify that inpatient services furnished can reasonably be expected to improve the patient's condition.    Georgiann Cocker, MD 3/4/201912:31 PM

## 2017-07-21 NOTE — Progress Notes (Signed)
Recreation Therapy Notes  Date: 07/21/17 Time: 0930 Location: [address] Dayroom  Group Topic: Stress Management  Goal Area(s) Addresses:  Patient will verbalize importance of using healthy stress management.  Patient will identify positive emotions associated with healthy stress management.   Intervention: Stress Management  Activity :  Guided Imagery.  LRT introduced the stress management technique of guided imagery.  LRT read a script that allowed patients to envision being on the beach.  Patients were to follow along as the script was read to engage in the activity.  Education: Stress Management, Discharge Planning.   Education Outcome: Acknowledges edcuation/In group clarification offered/Needs additional education  Clinical Observations/Feedback: Pt did not attend group.     Dillinger Aston, LRT/CTRS          Gregoire Bennis A 07/21/2017 12:07 PM 

## 2017-07-21 NOTE — Progress Notes (Signed)
Writer received report from Avery DennisonE.Nguyen RN. Writer spoke with patient shortly after admission. Patient was observed in her room eating a salad. Writer inquired about medications and she requested something to aid with sleep for tonight, She did not want to take her Seroquel or adderall tonight. She requested snacks which she received along with trazodone and sat in the dayroom watching tv. Safety maintained on unit with 15 min checks.

## 2017-07-21 NOTE — Tx Team (Signed)
Interdisciplinary Treatment and Diagnostic Plan Update  07/21/2017 Time of Session: 9:30am [name] MRN: [id number]  Principal Diagnosis: <principal problem not specified>  Secondary Diagnoses: Active Problems:   MDD (major depressive disorder), single episode, severe , no psychosis ([name])   Current Medications:  Current Facility-Administered Medications  Medication Dose Route Frequency Provider Last Rate Last Dose  . acetaminophen (TYLENOL) tablet 650 mg  650 mg Oral Q6H PRN Ethelene Hal, NP   650 mg at 07/21/17 0835  . albuterol (PROVENTIL HFA;VENTOLIN HFA) 108 (90 Base) MCG/ACT inhaler 1-2 puff  1-2 puff Inhalation Q6H PRN Ethelene Hal, NP   2 puff at 07/21/17 [phone]  . alum & mag hydroxide-simeth (MAALOX/MYLANTA) 200-200-20 MG/5ML suspension 30 mL  30 mL Oral Q4H PRN Ethelene Hal, NP      . amphetamine-dextroamphetamine (ADDERALL) tablet 10 mg  10 mg Oral BID Ethelene Hal, NP   10 mg at 07/21/17 8144  . escitalopram (LEXAPRO) tablet 10 mg  10 mg Oral Daily Ethelene Hal, NP   10 mg at 07/21/17 8185  . hydrochlorothiazide (HYDRODIURIL) tablet 25 mg  25 mg Oral Daily Ethelene Hal, NP   25 mg at 07/21/17 6314  . hydrOXYzine (ATARAX/VISTARIL) tablet 25 mg  25 mg Oral TID PRN Ethelene Hal, NP      . Influenza vac split quadrivalent PF (FLUARIX) injection 0.5 mL  0.5 mL Intramuscular Tomorrow-1000 Melissa Romp A, NP      . OLANZapine zydis (ZYPREXA) disintegrating tablet 5 mg  5 mg Oral Q8H PRN Ethelene Hal, NP       And  . LORazepam (ATIVAN) tablet 1 mg  1 mg Oral PRN Ethelene Hal, NP       And  . ziprasidone (GEODON) injection 20 mg  20 mg Intramuscular PRN Ethelene Hal, NP      . magnesium hydroxide (MILK OF MAGNESIA) suspension 30 mL  30 mL Oral Daily PRN Ethelene Hal, NP      . metoprolol tartrate (LOPRESSOR) tablet 25 mg  25 mg Oral Daily Ethelene Hal, NP   25 mg at  07/21/17 9702  . nicotine polacrilex (NICORETTE) gum 2 mg  2 mg Oral PRN Melissa Romp A, NP   2 mg at 07/21/17 0835  . ondansetron (ZOFRAN) tablet 4 mg  4 mg Oral Q8H PRN Ethelene Hal, NP      . QUEtiapine (SEROQUEL) tablet 50 mg  50 mg Oral QHS Ethelene Hal, NP      . traZODone (DESYREL) tablet 50 mg  50 mg Oral QHS PRN Ethelene Hal, NP   50 mg at 07/20/17 2208   PTA Medications: Medications Prior to Admission  Medication Sig Dispense Refill Last Dose  . albuterol (PROVENTIL HFA;VENTOLIN HFA) 108 (90 Base) MCG/ACT inhaler Inhale 1-2 puffs into the lungs every 6 (six) hours as needed for wheezing or shortness of breath. 1 Inhaler 5 Past Month at Unknown time  . amoxicillin-clavulanate (AUGMENTIN) 875-125 MG tablet Take 1 tablet by mouth 2 (two) times daily. (Patient not taking: Reported on 07/19/2017) 20 tablet 0 Not Taking at Unknown time  . amphetamine-dextroamphetamine (ADDERALL) 10 MG tablet Take 1 tablet by mouth 2 (two) times daily.  0 07/18/2017 at Unknown time  . budesonide-formoterol (SYMBICORT) 160-4.5 MCG/ACT inhaler Inhale 2 puffs into the lungs 2 (two) times daily. (Patient not taking: Reported on 05/29/2017) 10.2 Inhaler 3 Not Taking at Unknown time  . escitalopram (LEXAPRO)  10 MG tablet Take 10 mg by mouth daily.   07/18/2017 at Unknown time  . fluticasone (FLONASE) 50 MCG/ACT nasal spray Place 2 sprays into both nostrils daily. (Patient not taking: Reported on 07/19/2017) 16 g 6 Not Taking at Unknown time  . hydrochlorothiazide (HYDRODIURIL) 25 MG tablet Take 1 tablet (25 mg total) by mouth daily. 90 tablet 3 07/18/2017 at Unknown time  . ibuprofen (ADVIL,MOTRIN) 200 MG tablet Take 800 mg by mouth every 8 (eight) hours as needed (for pain/headache.).   07/18/2017 at Unknown time  . metoprolol tartrate (LOPRESSOR) 25 MG tablet Take 1 tablet (25 mg total) by mouth 2 (two) times daily. (Patient taking differently: Take 25 mg by mouth daily. ) 180 tablet 3 07/18/2017 at  Unknown time  . QUEtiapine (SEROQUEL) 50 MG tablet Take 50 mg by mouth at bedtime.   07/18/2017 at Unknown time    Patient Stressors: Loss of mom Marital or family conflict Occupational concerns  Patient Strengths: Active sense of humor Capable of independent living General fund of knowledge Motivation for treatment/growth Special hobby/interest Supportive family/friends Work skills  Treatment Modalities: Medication Management, Group therapy, Case management,  1 to 1 session with clinician, Psychoeducation, Recreational therapy.   Physician Treatment Plan for Primary Diagnosis: <principal problem not specified>  Medication Management: Evaluate patient's response, side effects, and tolerance of medication regimen.  Therapeutic Interventions: 1 to 1 sessions, Unit Group sessions and Medication administration.  Evaluation of Outcomes: Not Met  Physician Treatment Plan for Secondary Diagnosis: Active Problems:   MDD (major depressive disorder), single episode, severe , no psychosis ([place])   Medication Management: Evaluate patient's response, side effects, and tolerance of medication regimen.  Therapeutic Interventions: 1 to 1 sessions, Unit Group sessions and Medication administration.  Evaluation of Outcomes: Not Met   RN Treatment Plan for Primary Diagnosis: <principal problem not specified> Long Term Goal(s): Knowledge of disease and therapeutic regimen to maintain health will improve  Short Term Goals: Ability to remain free from injury will improve, Ability to verbalize frustration and anger appropriately will improve, Ability to demonstrate self-control, Ability to participate in decision making will improve, Ability to verbalize feelings will improve, Ability to disclose and discuss suicidal ideas, Ability to identify and develop effective coping behaviors will improve and Compliance with prescribed medications will improve  Medication Management: RN will administer  medications as ordered by provider, will assess and evaluate patient's response and provide education to patient for prescribed medication. RN will report any adverse and/or side effects to prescribing provider.  Therapeutic Interventions: 1 on 1 counseling sessions, Psychoeducation, Medication administration, Evaluate responses to treatment, Monitor vital signs and CBGs as ordered, Perform/monitor CIWA, COWS, AIMS and Fall Risk screenings as ordered, Perform wound care treatments as ordered.  Evaluation of Outcomes: Not Met   LCSW Treatment Plan for Primary Diagnosis: <principal problem not specified> Long Term Goal(s): Safe transition to appropriate next level of care at discharge, Engage patient in therapeutic group addressing interpersonal concerns.  Short Term Goals: Engage patient in aftercare planning with referrals and resources, Increase social support, Increase ability to appropriately verbalize feelings, Increase emotional regulation, Facilitate acceptance of mental health diagnosis and concerns, Facilitate patient progression through stages of change regarding substance use diagnoses and concerns, Identify triggers associated with mental health/substance abuse issues and Increase skills for wellness and recovery  Therapeutic Interventions: Assess for all discharge needs, 1 to 1 time with Social worker, Explore available resources and support systems, Assess for adequacy in community support network, Educate  family and significant other(s) on suicide prevention, Complete Psychosocial Assessment, Interpersonal group therapy.  Evaluation of Outcomes: Not Met   Progress in Treatment: Attending groups: No. New to unit Participating in groups: No. Taking medication as prescribed: Yes. Toleration medication: Yes. Family/Significant other contact made: No, will contact:  CSW will assess for an appropriate contact Patient understands diagnosis: Yes. Discussing patient identified  problems/goals with staff: Yes. Medical problems stabilized or resolved: Yes. Denies suicidal/homicidal ideation: Yes. Issues/concerns per patient self-inventory: No. Other:   New problem(s) identified: None  New Short Term/Long Term Goal(s): medication stabilization, elimination of SI thoughts, development of comprehensive mental wellness plan.   Patient Goals: "To find another way to deal with my anxiety and anger"   Discharge Plan or Barriers: CSW will assess for an appropriate discharge plan  Reason for Continuation of Hospitalization: Aggression Anxiety Depression Medication stabilization  Estimated Length of Stay: Friday, 07/25/17  Attendees: Patient: [name] 07/21/2017 8:58 AM  Physician: Dr. Leanord Hawking 07/21/2017 8:58 AM  Nursing: Trinna Post, RN 07/21/2017 8:58 AM  RN Care Manager: Rhunette Croft 07/21/2017 8:58 AM  Social Worker: Radonna Ricker, [place] 07/21/2017 8:58 AM  Recreational Therapist: Rhunette Croft 07/21/2017 8:58 AM  Other:  07/21/2017 8:58 AM  Other:  07/21/2017 8:58 AM  Other: 07/21/2017 8:58 AM    Scribe for Treatment Team: Marylee Floras, [place] 07/21/2017 8:58 AM

## 2017-07-21 NOTE — Progress Notes (Signed)
D: Pt was in the dayroom upon initial approach.  Pt presents with anxious affect and mood.  She reports her day was "okay" and her goal is to "try to get some sleep, I kept waking up last night."  Pt denies SI/HI, denies hallucinations, reports pain from headache of 8/10.  Pt has been visible in milieu interacting with peers and staff appropriately.  Pt attended evening group.    A: Introduced self to pt.  Actively listened to pt and offered support and encouragement. Medication administered per order.  PRN medication administered for anxiety per request.  Q15 minute safety checks maintained.  R: Pt is safe on the unit.  Pt is compliant with medications.  She reports she will inform staff of needs and concerns.  Pt verbally contracts for safety.  Will continue to monitor and assess.

## 2017-07-21 NOTE — Progress Notes (Signed)
Adult Psychoeducational Group Note  Date:  07/21/2017 Time:  9:16 PM  Group Topic/Focus:  Wrap-Up Group:   The focus of this group is to help patients review their daily goal of treatment and discuss progress on daily workbooks.  Participation Level:  Active  Participation Quality:  Appropriate  Affect:  Appropriate  Cognitive:  Appropriate  Insight: Appropriate  Engagement in Group:  Engaged  Modes of Intervention:  Discussion  Additional Comments:  The patient expressed that she rates today a 6.The patient also said that she attended group.  Octavio Mannshigpen, Danise Dehne Lee 07/21/2017, 9:16 PM

## 2017-07-21 NOTE — Plan of Care (Signed)
  Progressing Safety: Periods of time without injury will increase 07/21/2017 2236 - Progressing by Arrie Aranhurch, Jazma Pickel J, RN Note Pt has not harmed self or others tonight.  She denies SI/HI and verbally contracts for safety.

## 2017-07-22 DIAGNOSIS — F17210 Nicotine dependence, cigarettes, uncomplicated: Secondary | ICD-10-CM

## 2017-07-22 DIAGNOSIS — F1099 Alcohol use, unspecified with unspecified alcohol-induced disorder: Secondary | ICD-10-CM

## 2017-07-22 DIAGNOSIS — F419 Anxiety disorder, unspecified: Secondary | ICD-10-CM

## 2017-07-22 DIAGNOSIS — F909 Attention-deficit hyperactivity disorder, unspecified type: Secondary | ICD-10-CM

## 2017-07-22 MED ORDER — MIRTAZAPINE 15 MG PO TABS
15.0000 mg | ORAL_TABLET | Freq: Every day | ORAL | Status: DC
  Administered 2017-07-22: 15 mg via ORAL
  Filled 2017-07-22 (×3): qty 1

## 2017-07-22 MED ORDER — AMPHETAMINE-DEXTROAMPHETAMINE 10 MG PO TABS
10.0000 mg | ORAL_TABLET | Freq: Two times a day (BID) | ORAL | Status: DC
  Administered 2017-07-22 – 2017-07-23 (×3): 10 mg via ORAL
  Filled 2017-07-22 (×3): qty 1

## 2017-07-22 NOTE — Progress Notes (Addendum)
[facility] MD Progress Note  07/22/2017 2:35 PM [name]  MRN:  [id number]   Subjective:  Patient reports that she is feeling better and more calm today. She continues stating how stupid it was for her to impulsively take those pills. She now doesn't want the Seroquel in her house and admits to not taking it as prescribed any way. She would like something different to help with sleep. She reports that she was only prescribed the Seroquel for sleep and nothing else. She denies any SI/HI/AVH and contracts for safety. She still feels mildly depressed and gets anxious from time to time. She reports that she goes to the [facility] and plans to follow up there after discharge.   Objective:  37 y.o Caucasian female, lives with her boyfriend and their kids, employed. Background history of MDD. Presented to the ER via EMS. Had an argument with her significant other. Overdosed on 1000 mg of Seroquel. Reported to have been struggling since her mother passed last May. Drinks 4-5 beers a week. Has 3-4 hour sleep at night. Routine labs significant for slightly elevated AST. Toxicology is negative. UDS is positive for amphetamines. BAL<10 mg/dl.  Patient's chart and findings reviewed and discussed with treatment team. Patient presents in the day room and has been interacting with staff and peers appropriately. She is pleasant and cooperative and has a logical conversation about her treatment. She is in agreement to use Remeron instead of Seroquel QHS and will use the Vistaril PRN for anxiety. Will Start Remeron 15 mg QHS and discontinue the Seroquel.     Principal Problem: MDD (major depressive disorder), single episode, severe , no psychosis ([facility]) Diagnosis:   Patient Active Problem List   Diagnosis Date Noted  . MDD (major depressive disorder), single episode, severe , no psychosis ([facility]) [F32.2] 07/20/2017  . Acute sinusitis with symptoms > 10 days [J01.90] 05/29/2017  . Postpartum complication  [O90.89] 06/13/2015  . Normal labor and delivery [O80] 06/06/2015  . Indication for care in labor or delivery [O75.9] 06/06/2015  . S/P cesarean section [Z98.891] 06/06/2015  . Asthma, chronic [J45.909] 07/29/2014  . Depression [F32.9] 07/29/2014  . Smoking [F17.200] 07/29/2014  . Essential hypertension, benign [I10] 07/29/2014  . Missed abortion [O02.1] 10/02/2013   Total Time spent with patient: 30 minutes  Past Psychiatric History: See H&P  Past Medical History:  Past Medical History:  Diagnosis Date  . Anxiety   . Asthma   . Depression    doing good, not on meds now  . GERD (gastroesophageal reflux disease)   . Headache    migraines  . History of kidney stones   . Hypertension   . Kidney stones   . Missed abortion 10/02/2013  . Ovarian cyst   . Pregnancy induced hypertension   . Suicidal intent   . Vaginal Pap smear, abnormal    bx, f/u ok    Past Surgical History:  Procedure Laterality Date  . CESAREAN SECTION    . CESAREAN SECTION N/A 06/06/2015   Procedure: CESAREAN SECTION;  Surgeon: Kathreen Cosier, MD;  Location: [facility]S;  Service: Obstetrics;  Laterality: N/A;  . CHOLECYSTECTOMY N/A 08/23/2016   Procedure: LAPAROSCOPIC CHOLECYSTECTOMY;  Surgeon: Berna Bue, MD;  Location: [facility] OR;  Service: General;  Laterality: N/A;  . DILATION AND CURETTAGE OF UTERUS    . DILATION AND EVACUATION N/A 10/04/2013   Procedure: DILATATION AND EVACUATION;  Surgeon: Sherian Rein, MD;  Location: [facility]S;  Service: Gynecology;  Laterality: N/A;  US in room   . TONSILLECTOMY    . TUBAL LIGATION     Family History:  Family History  Problem Relation Age of Onset  . Hearing loss Mother   . Asthma Mother   . Asthma Father   . Diabetes Son   . Cancer Maternal Grandfather        bone  . Liver disease Maternal Grandfather   . Asthma Sister   . Lupus Sister    Family Psychiatric  History: See H&P Social History:  Social History   Substance and Sexual Activity   Alcohol Use Yes  . Alcohol/week: 2.4 oz  . Types: 4 Cans of beer per week     Social History   Substance and Sexual Activity  Drug Use No    Social History   Socioeconomic History  . Marital status: Single    Spouse name: None  . Number of children: None  . Years of education: None  . Highest education level: None  Social Needs  . Financial resource strain: None  . Food insecurity - worry: None  . Food insecurity - inability: None  . Transportation needs - medical: None  . Transportation needs - non-medical: None  Occupational History  . None  Tobacco Use  . Smoking status: Current Some Day Smoker    Packs/day: 0.25    Years: 15.00    Pack years: 3.75    Types: Cigarettes  . Smokeless tobacco: Never Used  . Tobacco comment: Smoking .5 ppd  Substance and Sexual Activity  . Alcohol use: Yes    Alcohol/week: 2.4 oz    Types: 4 Cans of beer per week  . Drug use: No  . Sexual activity: Yes    Partners: Male    Birth control/protection: Surgical  Other Topics Concern  . None  Social History Narrative  . None   Additional Social History:                         Sleep: Good  Appetite:  Good  Current Medications: Current Facility-Administered Medications  Medication Dose Route Frequency Provider Last Rate Last Dose  . acetaminophen (TYLENOL) tablet 650 mg  650 mg Oral Q6H PRN Laveda AbbeParks, Laurie Britton, NP   650 mg at 07/21/17 1731  . albuterol (PROVENTIL HFA;VENTOLIN HFA) 108 (90 Base) MCG/ACT inhaler 1-2 puff  1-2 puff Inhalation Q6H PRN Laveda AbbeParks, Laurie Britton, NP   2 puff at 07/21/17 [phone]  . alum & mag hydroxide-simeth (MAALOX/MYLANTA) 200-200-20 MG/5ML suspension 30 mL  30 mL Oral Q4H PRN Laveda AbbeParks, Laurie Britton, NP      . amphetamine-dextroamphetamine (ADDERALL) tablet 10 mg  10 mg Oral BID Money, Gerlene Burdockravis B, FNP   10 mg at 07/22/17 1259  . escitalopram (LEXAPRO) tablet 10 mg  10 mg Oral Daily Laveda AbbeParks, Laurie Britton, NP   10 mg at 07/22/17 0816  .  hydrochlorothiazide (HYDRODIURIL) tablet 25 mg  25 mg Oral Daily Laveda AbbeParks, Laurie Britton, NP   25 mg at 07/22/17 0816  . hydrOXYzine (ATARAX/VISTARIL) tablet 25 mg  25 mg Oral TID PRN Laveda AbbeParks, Laurie Britton, NP   25 mg at 07/22/17 1259  . ibuprofen (ADVIL,MOTRIN) tablet 600 mg  600 mg Oral Q6H PRN Kerry HoughSimon, Spencer E, PA-C   600 mg at 07/22/17 1416  . magnesium hydroxide (MILK OF MAGNESIA) suspension 30 mL  30 mL Oral Daily PRN Laveda AbbeParks, Laurie Britton, NP      . metoprolol tartrate (  LOPRESSOR) tablet 25 mg  25 mg Oral Daily Laveda Abbe, NP   25 mg at 07/22/17 0816  . mirtazapine (REMERON) tablet 15 mg  15 mg Oral QHS Money, Travis B, FNP      . nicotine polacrilex (NICORETTE) gum 2 mg  2 mg Oral PRN Nira Conn A, NP   2 mg at 07/22/17 1417  . ondansetron (ZOFRAN) tablet 4 mg  4 mg Oral Q8H PRN Laveda Abbe, NP      . traZODone (DESYREL) tablet 50 mg  50 mg Oral QHS PRN Laveda Abbe, NP   50 mg at 07/21/17 2328    Lab Results: No results found for this or any previous visit (from the past 48 hour(s)).  Blood Alcohol level:  Lab Results  Component Value Date   ETH <10 07/19/2017    Metabolic Disorder Labs: Lab Results  Component Value Date   HGBA1C 5.2 07/29/2014   MPG 103 07/29/2014   No results found for: PROLACTIN No results found for: CHOL, TRIG, HDL, CHOLHDL, VLDL, LDLCALC  Physical Findings: AIMS: Facial and Oral Movements Muscles of Facial Expression: None, normal Lips and Perioral Area: None, normal Jaw: None, normal Tongue: None, normal,Extremity Movements Upper (arms, wrists, hands, fingers): None, normal Lower (legs, knees, ankles, toes): None, normal, Trunk Movements Neck, shoulders, hips: None, normal, Overall Severity Severity of abnormal movements (highest score from questions above): None, normal Incapacitation due to abnormal movements: None, normal Patient's awareness of abnormal movements (rate only patient's report): No Awareness, Dental  Status Current problems with teeth and/or dentures?: No Does patient usually wear dentures?: No  CIWA:    COWS:     Musculoskeletal: Strength & Muscle Tone: within normal limits Gait & Station: normal Patient leans: N/A  Psychiatric Specialty Exam: Physical Exam  ROS  Blood pressure (!) 142/80, pulse 87, temperature 99 F (37.2 C), temperature source Oral, resp. rate 18, height 5\' 4"  (1.626 m), SpO2 98 %.Body mass index is 26.61 kg/m.  General Appearance: Casual  Eye Contact:  Good  Speech:  Clear and Coherent and Normal Rate  Volume:  Normal  Mood:  Euthymic  Affect:  Congruent  Thought Process:  Goal Directed and Descriptions of Associations: Intact  Orientation:  Full (Time, Place, and Person)  Thought Content:  WDL  Suicidal Thoughts:  No  Homicidal Thoughts:  No  Memory:  Immediate;   Good Recent;   Good Remote;   Good  Judgement:  Good  Insight:  Good  Psychomotor Activity:  Normal  Concentration:  Concentration: Good and Attention Span: Good  Recall:  Good  Fund of Knowledge:  Good  Language:  Good  Akathisia:  No  Handed:  Right  AIMS (if indicated):     Assets:  Communication Skills Desire for Improvement Financial Resources/Insurance Housing Physical Health Social Support Transportation  ADL's:  Intact  Cognition:  WNL  Sleep:  Number of Hours: 6.75   Problems Addressed: MDD severe  Treatment Plan Summary: Daily contact with patient to assess and evaluate symptoms and progress in treatment, Medication management and Plan is to:  -Continue Adderall 10 mg PO BID for ADHD -Continue Lexapro 10 mg PO Daily for mood stability -Start Remeron 15 mg PO QHS for sleep and mood stability -Stop Seroquel -Continue Vistaril 25 mg PO TID PRN for anxiety -Continue Trazodone 50 mg PO QHS PRN for insomnia -Encourage group therapy participation   Maryfrances Bunnell, FNP 07/22/2017, 2:35 PM   Agree with NP Progress  Note

## 2017-07-22 NOTE — Progress Notes (Signed)
D: Patient observed initially sleepy this AM stating she had difficulty sleeping. "I took something with my night time meds. Then I woke up and they gave me something else. Maybe I took it too late." Patient's affect flat, mood depressed. Per self inventory and discussions with writer, rates depression at a 2/10, hopelessness at a 0/10 and anxiety at a 4/10. Rates sleep as fair, appetite as good, energy as normal and concentration as good.  States goal for today is "focusing on how to deal with anger and what not to do when sad and angry. Listen, pay attention, practice given strategies. When do I get to go home to my kids and family?" Denies pain, physical complaints.   A: Medicated per orders, prn vistaril given for anxiety. Modified adderall schedule according to regimen at home. Level III obs in place for safety. Emotional support offered and self inventory reviewed. Encouraged completion of Suicide Safety Plan and programming participation. Discussed POC with MD, SW.  R: Patient verbalizes understanding of POC. Will reassess prn when patient is out of group. Patient denies SI/HI/AVH and remains safe on level III obs. Will continue to monitor closely and make verbal contact frequently.

## 2017-07-22 NOTE — [facility] Group Notes (Addendum)
[facility] Group Notes:  (Nursing/MHT/Case Management/Adjunct)  Date:  07/22/2017  Time:  1515  Type of Therapy:  Nurse Education - Therapeutic Activity  Participation Level:  Minimal  Participation Quality:  Attentive  Affect:  Depressed and Flat  Cognitive:  Alert  Insight:  Limited  Engagement in Group:  Limited  Modes of Intervention:  Activity  Summary of Progress/Problems: Patient attended group and participated in therapeutic ball activity. Shared with peers, however remains flat, depressed and hopeless.  Lawrence MarseillesFriedman, Janett Kamath Eakes 07/22/2017, 5:08 PM

## 2017-07-22 NOTE — Plan of Care (Signed)
Patient verbalizes understanding of information, education provided. 

## 2017-07-22 NOTE — Progress Notes (Signed)
Patient has remained flat, sad and depressed this afternoon even though she asks about discharge. "I need to get back to my kids and my job. I don't want to miss too much work." Somatic at times. Continues to worry about BP that is stable. "I have this headache. Maybe it's caffeine related but I think it's my bp." BP manually obtained, vistaril and advil given for complaints. On reassess, patient was resting in bed. BP stable. Staff continues to emotionally support.

## 2017-07-22 NOTE — [facility] Group Notes (Signed)
LCSW Group Therapy Note 07/22/2017 12:29 PM  Type of Therapy and Topic: Group Therapy: Avoiding Self-Sabotaging and Enabling Behaviors  Participation Level: Active  Description of Group:  In this group, patients will learn how to identify obstacles, self-sabotaging and enabling behaviors, as well as: what are they, why do we do them and what needs these behaviors meet. Discuss unhealthy relationships and how to have positive healthy boundaries with those that sabotage and enable. Explore aspects of self-sabotage and enabling in yourself and how to limit these self-destructive behaviors in everyday life.  Therapeutic Goals: 1. Patient will identify one obstacle that relates to self-sabotage and enabling behaviors 2. Patient will identify one personal self-sabotaging or enabling behavior they did prior to admission 3. Patient will state a plan to change the above identified behavior 4. Patient will demonstrate ability to communicate their needs through discussion and/or role play.   Summary of Patient Progress:  [name] was engaged and participated throughout today's group session. [name] states that self sabotaging is "doubting yourself". She states that her self sabotaging behavior is negative thinking. [name] reports that she would like to learn better coping skills and how to focus on positivity so she can avoid those behaviors in the future.    Therapeutic Modalities:  Cognitive Behavioral Therapy Person-Centered Therapy Motivational Interviewing   Baldo DaubJolan Azora Bonzo LCSWA Clinical Social Worker

## 2017-07-22 NOTE — Progress Notes (Signed)
D   Pt is pleasant on approach and cooperative    She complains of a headache most of the day which is relieved by motrin   She interacts well with others and is visible and social on the milieu   She is compliant with treatment and her behavior is appropriate A    Verbal support given    Medications administered and effectiveness monitored   Q 15 min checks R   Pt is safe at present time and is receptive to verbal support

## 2017-07-23 DIAGNOSIS — F332 Major depressive disorder, recurrent severe without psychotic features: Secondary | ICD-10-CM | POA: Diagnosis present

## 2017-07-23 MED ORDER — MIRTAZAPINE 15 MG PO TABS: 15.0000 mg | ORAL_TABLET | Freq: Every day | ORAL | 0 refills | Status: DC

## 2017-07-23 MED ORDER — TRAZODONE HCL 50 MG PO TABS: 50.0000 mg | ORAL_TABLET | Freq: Every evening | ORAL | 0 refills | Status: DC | PRN

## 2017-07-23 MED ORDER — ESCITALOPRAM OXALATE 10 MG PO TABS: 10.0000 mg | ORAL_TABLET | Freq: Every day | ORAL | 0 refills | Status: DC

## 2017-07-23 MED ORDER — HYDROXYZINE HCL 25 MG PO TABS: 25.0000 mg | ORAL_TABLET | Freq: Three times a day (TID) | ORAL | 0 refills | Status: DC | PRN

## 2017-07-23 NOTE — Progress Notes (Signed)
Discharge Note:  Patient discharged home with family member.  Patient denied SI and HI.  Denied A/V hallucinations.  Suicide prevention information given and discussed with patient who stated she understood and had no questions.  Patient stated she received all her belongings, clothing, toiletries, misc items, prescriptions, etc.  Patient stated she appreciated all assistance received from [facility] staff.  All required discharge information given to patient at discharge  

## 2017-07-23 NOTE — Progress Notes (Signed)
D:  Patient's self inventory sheet, patient sleeps good, sleep medication helpful.  Good appetite, normal energy level, good concentration.  Denied depression and hopeless and anxiety #2.  Denied withdrawals.  Denied SI.  Denied physical problems.  Denied physical pain.  Goal is discharge to children and return to work.  Plans to participate, work well with others, listen, etc.  Does have discharge plans. A:  Medications administered per MD orders.  Emotional support and encouragement given patient. R:  Denied SI and HI, contracts for safety.  Denied A/V hallucinations.  Safety maintained with 15 minute checks.

## 2017-07-23 NOTE — Plan of Care (Signed)
Nurse discussed depression, anxiety, coping skills with patient.  

## 2017-07-23 NOTE — Progress Notes (Signed)
Adult Psychoeducational Group Note  Date:  07/23/2017 Time:  2:02 AM  Group Topic/Focus:  Wrap-Up Group:   The focus of this group is to help patients review their daily goal of treatment and discuss progress on daily workbooks.  Participation Level:  Minimal  Participation Quality:  Attentive  Affect:  Appropriate  Cognitive:  Alert  Insight: Lacking  Engagement in Group:  Engaged  Modes of Intervention:  Discussion, Education, Socialization and Support  Additional Comments:  Pt rated her day at a 10 out of 10, but did not elaborate why her day was rated so high. Pt did not share a goal, but stated she is hopeful she will discharge tomorrow.  Malachy MoanJeffers, Doree Kuehne S 07/23/2017, 2:02 AM

## 2017-07-23 NOTE — [facility] Suicide Risk Assessment (Signed)
[facility] INPATIENT:  Family/Significant Other Suicide Prevention Education  Suicide Prevention Education:  Education Completed; [name], father, [phone], has been identified by the patient as the family member/significant other with whom the patient will be residing, and identified as the person(s) who will aid the patient in the event of a mental health crisis (suicidal ideations/suicide attempt).  With written consent from the patient, the family member/significant other has been provided the following suicide prevention education, prior to the and/or following the discharge of the patient.  The suicide prevention education provided includes the following:  Suicide risk factors  Suicide prevention and interventions  National Suicide Hotline telephone number  [facility] telephone number  [facility] Assistance 911  [facility] and/or [facility]idential Mobile Crisis [facility] telephone number  Request made of family/significant other to:  Remove weapons (e.g., guns, rifles, knives), all items previously/currently identified as safety concern.  [name] not aware of any guns.  Remove drugs/medications (over-the-counter, prescriptions, illicit drugs), all items previously/currently identified as a safety concern.  The family member/significant other verbalizes understanding of the suicide prevention education information provided.  The family member/significant other agrees to remove the items of safety concern listed above.  [name] said pt "has a lot going on."  Said pt has actually responded very well to her mother's death: she moved into her own place, has been working, raising her kids.  [name] said he does not think she wants to die at all, just can get stressed out.  He has spoken to her the past few days on the phone and she seems to be doing well.  [name] thinks this may "all be for the better" because she needed a break.    [name], Melissa Bostock Jon,  LCSW 07/23/2017, 9:09 AM

## 2017-07-23 NOTE — [facility] Suicide Risk Assessment (Signed)
[facility] Discharge Suicide Risk Assessment   Principal Problem: MDD (major depressive disorder), single episode, severe , no psychosis (HCC) Discharge Diagnoses:  Patient Active Problem List   Diagnosis Date Noted  . MDD (major depressive disorder), recurrent severe, without psychosis (HCC) [F33.2] 07/23/2017  . MDD (major depressive disorder), single episode, severe , no psychosis (HCC) [F32.2] 07/20/2017  . Acute sinusitis with symptoms > 10 days [J01.90] 05/29/2017  . Postpartum complication [O90.89] 06/13/2015  . Normal labor and delivery [O80] 06/06/2015  . Indication for care in labor or delivery [O75.9] 06/06/2015  . S/P cesarean section [Z98.891] 06/06/2015  . Asthma, chronic [J45.909] 07/29/2014  . Depression [F32.9] 07/29/2014  . Smoking [F17.200] 07/29/2014  . Essential hypertension, benign [I10] 07/29/2014  . Missed abortion [O02.1] 10/02/2013    Total Time spent with patient: 30 minutes  Musculoskeletal: Strength & Muscle Tone: within normal limits Gait & Station: normal Patient leans: N/A  Psychiatric Specialty Exam: ROSdenies headache today , no chest pain, no shortness of breath, no vomiting , no fever, no chills   Blood pressure 128/89, pulse 70, temperature 98 F (36.7 C), temperature source Oral, resp. rate 14, height 5\' 4"  (1.626 m), SpO2 98 %.Body mass index is 26.61 kg/m.  General Appearance: Well Groomed  Eye Contact::  Good  Speech:  Normal Rate409  Volume:  Normal  Mood:  reports improved mood, not feeling depressed at this time, reports mood as 9/10  Affect:  Appropriate and Full Range  Thought Process:  Linear and Descriptions of Associations: Intact  Orientation:  Full (Time, Place, and Person)  Thought Content:  no hallucinations, no delusions, not internally preoccupied   Suicidal Thoughts:  No denies suicidal or self injurious ideations, denies homicidal or violent ideations   Homicidal Thoughts:  No  Memory:  recent and remote grossly intact    Judgement:  Other:  improving   Insight:  improving   Psychomotor Activity:  Normal  Concentration:  Good  Recall:  Good  Fund of Knowledge:Good  Language: Good  Akathisia:  Negative  Handed:  Right  AIMS (if indicated):     Assets:  Communication Skills Desire for Improvement Resilience  Sleep:  Number of Hours: 6.75  Cognition: WNL  ADL's:  Intact   Mental Status Per Nursing Assessment::   On Admission:     Demographic Factors:  37 year old married female, lives with her BF and her children, is employed   Loss Factors: Mother passed away last year, recent argument with BF   Historical Factors: No prior psychiatric admissions, no prior suicide attempts, history of depression  Risk Reduction Factors:   Responsible for children under 41 years of age, Sense of responsibility to family, Employed and Living with another person, especially a relative  Continued Clinical Symptoms:  At this time patient is alert, attentive,well related, mood is improved , currently denies feeling depressed, affect is appropriate, reactive, no thought disorder, no suicidal or self injurious ideations, no homicidal or violent ideations , no hallucinations, no delusions,  future oriented .  Denies medication side effects. We reviewed medication side effects including addictive potential of Adderall, which she states she has been taking for several weeks for ADD symptoms, potential cardiovascular side effects associated with this medication. We reviewed sedation and weight gain potential associated with Remeron as well  Behavior on unit in good control, pleasant on approach.  Cognitive Features That Contribute To Risk:  No gross cognitive deficits noted upon discharge. Is alert , attentive, and oriented  x 3   Suicide Risk:  Mild:  Suicidal ideation of limited frequency, intensity, duration, and specificity.  There are no identifiable plans, no associated intent, mild dysphoria and related symptoms,  good self-control (both objective and subjective assessment), few other risk factors, and identifiable protective factors, including available and accessible social support.  Follow-up Information    [facility]. Go on 07/25/2017.   Specialty:  Behavioral Health Why:  Please attend your therapy appt with Kenyon Anaon Swayne on Friday, 07/25/17, at 8:00am. Contact information: [address] [place] [address] [phone]        [facility]. Go on 07/28/2017.   Why:  Please attend your intake appt on Monday, 07/28/17, at 8:00am.  Please bring a copy of your hospital discharge paperwork. Contact information: [address] [place] [address] [phone]           Plan Of Care/Follow-up recommendations:  Activity:  as tolerated Diet:  heart healthy Tests:  NA Other:  See below  Patient is expressing readiness for discharge and there are no current grounds for involuntary commitment. She is leaving unit in good spirits  Plans to return home, states BF will come pick her up later today Follow up as above Plans to follow up with PCP for ongoing management of medical issues.   Craige CottaFernando A Shaylyn Bawa, MD 07/23/2017, 1:03 PM

## 2017-07-23 NOTE — [facility] Group Notes (Signed)
[facility] 07/23/2017 1:15pm  Type of Therapy: Mental Health Association Presentation  Participation Level: Invited. Chose to remain in bed.  Summary of Progress/Problems: Mental [facility] Speaker came to talk about his personal journey with mental health. The pt processed ways by which to relate to the speaker. [facility] speaker provided handouts and educational information pertaining to groups and services offered by the [facility]. Pt was engaged in speaker's presentation and was receptive to resources provided.    Pulte HomesHeather N Smart, LCSW 07/23/2017 12:51 PM

## 2017-07-23 NOTE — Discharge Summary (Addendum)
Physician Discharge Summary Note  Patient:  [name] is an 37 y.o., female MRN:  [id number] DOB:  1980-08-08 Patient phone:  [phone] (home)  Patient address:   [address] [place] [address],  Total Time spent with patient: 20 minutes  Date of Admission:  07/20/2017 Date of Discharge: 07/24/17  Reason for Admission:  Worsening depression with suicide attempt  Principal Problem: MDD (major depressive disorder), single episode, severe , no psychosis [facility]) Discharge Diagnoses: Patient Active Problem List   Diagnosis Date Noted  . MDD (major depressive disorder), recurrent severe, without psychosis (HCC) [F33.2] 07/23/2017  . MDD (major depressive disorder), single episode, severe , no psychosis (HCC) [F32.2] 07/20/2017  . Acute sinusitis with symptoms > 10 days [J01.90] 05/29/2017  . Postpartum complication [O90.89] 06/13/2015  . Normal labor and delivery [O80] 06/06/2015  . Indication for care in labor or delivery [O75.9] 06/06/2015  . S/P cesarean section [Z98.891] 06/06/2015  . Asthma, chronic [J45.909] 07/29/2014  . Depression [F32.9] 07/29/2014  . Smoking [F17.200] 07/29/2014  . Essential hypertension, benign [I10] 07/29/2014  . Missed abortion [O02.1] 10/02/2013    Past Psychiatric History: Followed at [facility]. She is on Lexapro 10 mg daily, Adderral 10 mg BID and Clonazepam. Depression has been controlled by out-patient care. No past inpatient admission.  No past history of mania. No past history of psychosis. No past history of suicidal behavior. No past history of violent behavior.    Past Medical History:  Past Medical History:  Diagnosis Date  . Anxiety   . Asthma   . Depression    doing good, not on meds now  . GERD (gastroesophageal reflux disease)   . Headache    migraines  . History of kidney stones   . Hypertension   . Kidney stones   . Missed abortion 10/02/2013  . Ovarian cyst   . Pregnancy induced hypertension   .  Suicidal intent   . Vaginal Pap smear, abnormal    bx, f/u ok    Past Surgical History:  Procedure Laterality Date  . CESAREAN SECTION    . CESAREAN SECTION N/A 06/06/2015   Procedure: CESAREAN SECTION;  Surgeon: Kathreen Cosier, MD;  Location: [facility]S;  Service: Obstetrics;  Laterality: N/A;  . CHOLECYSTECTOMY N/A 08/23/2016   Procedure: LAPAROSCOPIC CHOLECYSTECTOMY;  Surgeon: Berna Bue, MD;  Location: [facility] OR;  Service: General;  Laterality: N/A;  . DILATION AND CURETTAGE OF UTERUS    . DILATION AND EVACUATION N/A 10/04/2013   Procedure: DILATATION AND EVACUATION;  Surgeon: Sherian Rein, MD;  Location: [facility];  Service: Gynecology;  Laterality: N/A;  [place] in room   . TONSILLECTOMY    . TUBAL LIGATION     Family History:  Family History  Problem Relation Age of Onset  . Hearing loss Mother   . Asthma Mother   . Asthma Father   . Diabetes Son   . Cancer Maternal Grandfather        bone  . Liver disease Maternal Grandfather   . Asthma Sister   . Lupus Sister    Family Psychiatric  History: Mom suffers from depression. No family history of suicide.  Social History:  Social History   Substance and Sexual Activity  Alcohol Use Yes  . Alcohol/week: 2.4 oz  . Types: 4 Cans of beer per week     Social History   Substance and Sexual Activity  Drug Use No    Social History   Socioeconomic  History  . Marital status: Single    Spouse name: None  . Number of children: None  . Years of education: None  . Highest education level: None  Social Needs  . Financial resource strain: None  . Food insecurity - worry: None  . Food insecurity - inability: None  . Transportation needs - medical: None  . Transportation needs - non-medical: None  Occupational History  . None  Tobacco Use  . Smoking status: Current Some Day Smoker    Packs/day: 0.25    Years: 15.00    Pack years: 3.75    Types: Cigarettes  . Smokeless tobacco: Never Used  . Tobacco comment: Smoking  .5 ppd  Substance and Sexual Activity  . Alcohol use: Yes    Alcohol/week: 2.4 oz    Types: 4 Cans of beer per week  . Drug use: No  . Sexual activity: Yes    Partners: Male    Birth control/protection: Surgical  Other Topics Concern  . None  Social History Narrative  . None    Hospital Course:   07/21/17 [facility] MD Assessment: 14 y.o Caucasian female, lives with her boyfriend and their kids, employed. Background history of MDD. Presented to the ER via EMS. Had an argument with her significant other. Overdosed on 1000 mg of Seroquel. Reported to have been struggling since her mother passed last May. Drinks 4-5 beers a week. Has 3-4 hour sleep at night. Routine labs significant for slightly elevated AST. Toxicology is negative. UDS is positive for amphetamines. BAL<10 mg/dl. At interview, patient says she was doing well before the OD. Says she had an argument with her boyfriend. Says it was an ugly encounter. Patient says she was very angry and just wanted to get a reaction from him. Says she told him she was going to take an OD and he did not show any concern. Says she then went ahead and took a handful of her pills. She then told him what she did. Says she went into the bathroom and induced vomiting. She sent a message to her sister on Facebook telling her what she did. Patient says this was around midnight. They went to bed afterwards. Says around 6 AM the EMS woke her up. Her sister got the message and called for help. Patient says she was feeling groggy then and wanted to get help. Tells me that after the OD, she had called poison control just to be sure that what she took was not lethal. Patient states OD was impulsive. It was not planned. She has never attempted suicide in the past. She did not have any death wish. Says she has her children and family to live for. She is worried that this might affect her records. Wants to work with [place] voluntarily. Hopes to be back to work soon. Admits occasional  alcohol use. Does not feel she is drinking excessively. Does not feel it affects her functions. Denies use of any other substance. No synthetic substance use.  Denies any other stressors at home or work. No financial constraints. No legal issues. No associated psychosis. No evidence of mania. No overwhelming anxiety. No thoughts of harming others. No thoughts of violence. No access to weapons.   Patient remained on the [facility] unit for 2 days and stabilized with medication and therapy. Patient was started on Lexapro 10 mg Daily, Remeron 15 mg QHS, and Trazodone 50 QHS PRN. Her Seroquel was stopped as it was being used for sleep only. Patient showed improvement  with improved mood, affect, sleep, appetite, and interaction. Patient has been seen in the day room interacting with peers and staff appropriately. Patient has been attending group and participating. Patient denies any SI/HI/AVH and contracts for safety. Patient agrees to follow up at [facility] and [place]. Patient is provided with prescriptions for her medications upon discharge.    Physical Findings: AIMS: Facial and Oral Movements Muscles of Facial Expression: None, normal Lips and Perioral Area: None, normal Jaw: None, normal Tongue: None, normal,Extremity Movements Upper (arms, wrists, hands, fingers): None, normal Lower (legs, knees, ankles, toes): None, normal, Trunk Movements Neck, shoulders, hips: None, normal, Overall Severity Severity of abnormal movements (highest score from questions above): None, normal Incapacitation due to abnormal movements: None, normal Patient's awareness of abnormal movements (rate only patient's report): No Awareness, Dental Status Current problems with teeth and/or dentures?: No Does patient usually wear dentures?: No  CIWA:  CIWA-Ar Total: 1 COWS:  COWS Total Score: 1  Musculoskeletal: Strength & Muscle Tone: within normal limits Gait & Station: normal Patient leans: N/A  Psychiatric Specialty  Exam: Physical Exam  Nursing note and vitals reviewed. Constitutional: She is oriented to person, place, and time. She appears well-developed and well-nourished.  Cardiovascular: Normal rate.  Respiratory: Effort normal.  Musculoskeletal: Normal range of motion.  Neurological: She is alert and oriented to person, place, and time.  Skin: Skin is warm.    Review of Systems  Constitutional: Negative.   HENT: Negative.   Eyes: Negative.   Respiratory: Negative.   Cardiovascular: Negative.   Gastrointestinal: Negative.   Genitourinary: Negative.   Musculoskeletal: Negative.   Skin: Negative.   Neurological: Negative.   Psychiatric/Behavioral: Negative.     Blood pressure 128/89, pulse 70, temperature 98 F (36.7 C), temperature source Oral, resp. rate 14, height 5\' 4"  (1.626 m), SpO2 98 %.Body mass index is 26.61 kg/m.  General Appearance: Casual  Eye Contact:  Good  Speech:  Clear and Coherent and Normal Rate  Volume:  Normal  Mood:  Euthymic  Affect:  Appropriate  Thought Process:  Goal Directed and Descriptions of Associations: Intact  Orientation:  Full (Time, Place, and Person)  Thought Content:  WDL  Suicidal Thoughts:  No  Homicidal Thoughts:  No  Memory:  Immediate;   Good Recent;   Good Remote;   Good  Judgement:  Good  Insight:  Good  Psychomotor Activity:  Normal  Concentration:  Concentration: Good and Attention Span: Good  Recall:  Good  Fund of Knowledge:  Good  Language:  Good  Akathisia:  No  Handed:  Right  AIMS (if indicated):     Assets:  Communication Skills Desire for Improvement Financial Resources/Insurance Housing Physical Health Social Support Transportation  ADL's:  Intact  Cognition:  WNL  Sleep:  Number of Hours: 6.75        Has this patient used any form of tobacco in the last 30 days? (Cigarettes, Smokeless Tobacco, Cigars, and/or Pipes) Yes, No  Blood Alcohol level:  Lab Results  Component Value Date   ETH <10 07/19/2017     Metabolic Disorder Labs:  Lab Results  Component Value Date   HGBA1C 5.2 07/29/2014   MPG 103 07/29/2014   No results found for: PROLACTIN No results found for: CHOL, TRIG, HDL, CHOLHDL, VLDL, LDLCALC  See Psychiatric Specialty Exam and Suicide Risk Assessment completed by Attending Physician prior to discharge.  Discharge destination:  Home  Is patient on multiple antipsychotic therapies at discharge:  No  Has Patient had three or more failed trials of antipsychotic monotherapy by history:  No  Recommended Plan for Multiple Antipsychotic Therapies: NA   Allergies as of 07/23/2017      Reactions   Penicillins Nausea And Vomiting, Other (See Comments)   UNSPECIFIED REACTION  Has patient had a PCN reaction causing immediate rash, facial/tongue/throat swelling, SOB or lightheadedness with hypotension:No Has patient had a PCN reaction causing severe rash involving mucus membranes or skin necrosis:No Has patient had a PCN reaction that REACTION THAT REQUIRED HOSPITALIZATION:  #  #  #  YES  #  #  #  Has patient had a PCN reaction occurring within the last 10 years: #  #  #  YES  #  #  #    Norvasc [amlodipine Besylate] Other (See Comments)   Hypersensitivity? Tip of tongue numb and flushing.      Medication List    STOP taking these medications   amoxicillin-clavulanate 875-125 MG tablet Commonly known as:  AUGMENTIN   ibuprofen 200 MG tablet Commonly known as:  ADVIL,MOTRIN   QUEtiapine 50 MG tablet Commonly known as:  SEROQUEL     TAKE these medications     Indication  albuterol 108 (90 Base) MCG/ACT inhaler Commonly known as:  PROVENTIL HFA;VENTOLIN HFA Inhale 1-2 puffs into the lungs every 6 (six) hours as needed for wheezing or shortness of breath.  Indication:  Asthma   amphetamine-dextroamphetamine 10 MG tablet Commonly known as:  ADDERALL Take 1 tablet by mouth 2 (two) times daily.  Indication:  Attention Deficit Hyperactivity Disorder    budesonide-formoterol 160-4.5 MCG/ACT inhaler Commonly known as:  SYMBICORT Inhale 2 puffs into the lungs 2 (two) times daily.  Indication:  Asthma   escitalopram 10 MG tablet Commonly known as:  LEXAPRO Take 1 tablet (10 mg total) by mouth daily. For mood control What changed:  additional instructions  Indication:  mood stability   fluticasone 50 MCG/ACT nasal spray Commonly known as:  FLONASE Place 2 sprays into both nostrils daily.  Indication:  Signs and Symptoms of Nose Diseases   hydrochlorothiazide 25 MG tablet Commonly known as:  HYDRODIURIL Take 1 tablet (25 mg total) by mouth daily.  Indication:  High Blood Pressure Disorder   hydrOXYzine 25 MG tablet Commonly known as:  ATARAX/VISTARIL Take 1 tablet (25 mg total) by mouth 3 (three) times daily as needed for anxiety.  Indication:  Feeling Anxious   metoprolol tartrate 25 MG tablet Commonly known as:  LOPRESSOR Take 1 tablet (25 mg total) by mouth 2 (two) times daily. What changed:  when to take this  Indication:  Per PCP   mirtazapine 15 MG tablet Commonly known as:  REMERON Take 1 tablet (15 mg total) by mouth at bedtime. For mood control  Indication:  mood stability   traZODone 50 MG tablet Commonly known as:  DESYREL Take 1 tablet (50 mg total) by mouth at bedtime as needed for sleep.  Indication:  Trouble Sleeping      Follow-up [facility], [facility]. Go on 07/25/2017.   Specialty:  Behavioral Health Why:  Please attend your therapy appt with Kenyon Ana on Friday, 07/25/17, at 8:00am. Contact information: [address] [place] [address] [phone]        [facility]. Go on 07/28/2017.   Why:  Please attend your intake appt on Monday, 07/28/17, at 8:00am.  Please bring a copy of your hospital discharge paperwork. Contact information: [address]  [place] [address] [phone]           Follow-up recommendations:  Continue activity as tolerated. Continue diet as  recommended by your PCP. Ensure to keep all appointments with outpatient providers.  Comments:  Patient is instructed prior to discharge to: Take all medications as prescribed by his/her mental healthcare provider. Report any adverse effects and or reactions from the medicines to his/her outpatient provider promptly. Patient has been instructed & cautioned: To not engage in alcohol and or illegal drug use while on prescription medicines. In the event of worsening symptoms, patient is instructed to call the crisis hotline, 911 and or go to the nearest ED for appropriate evaluation and treatment of symptoms. To follow-up with his/her primary care provider for your other medical issues, concerns and or health care needs.    Signed: Gerlene Burdockravis B Money, FNP 07/24/2017, 8:08 AM   Patient seen, Suicide Assessment Completed.  Disposition Plan Reviewed

## 2017-12-26 ENCOUNTER — Other Ambulatory Visit: Payer: Self-pay | Admitting: Physician Assistant

## 2017-12-26 DIAGNOSIS — I10 Essential (primary) hypertension: Secondary | ICD-10-CM

## 2017-12-31 ENCOUNTER — Ambulatory Visit: Payer: Self-pay

## 2018-01-11 ENCOUNTER — Other Ambulatory Visit: Payer: Self-pay | Admitting: Physician Assistant

## 2018-01-11 DIAGNOSIS — I10 Essential (primary) hypertension: Secondary | ICD-10-CM

## 2018-01-15 ENCOUNTER — Encounter (HOSPITAL_COMMUNITY): Payer: Self-pay | Admitting: *Deleted

## 2018-01-15 ENCOUNTER — Emergency Department (HOSPITAL_COMMUNITY)
Admission: EM | Admit: 2018-01-15 | Discharge: 2018-01-16 | Disposition: A | Discharge status: Home / Self Care | Payer: Medicaid Other | Attending: Emergency Medicine | Admitting: Emergency Medicine

## 2018-01-15 ENCOUNTER — Other Ambulatory Visit: Payer: Self-pay

## 2018-01-15 DIAGNOSIS — R7989 Other specified abnormal findings of blood chemistry: Secondary | ICD-10-CM | POA: Diagnosis not present

## 2018-01-15 DIAGNOSIS — J45909 Unspecified asthma, uncomplicated: Secondary | ICD-10-CM | POA: Insufficient documentation

## 2018-01-15 DIAGNOSIS — R1011 Right upper quadrant pain: Secondary | ICD-10-CM | POA: Insufficient documentation

## 2018-01-15 DIAGNOSIS — R945 Abnormal results of liver function studies: Secondary | ICD-10-CM

## 2018-01-15 DIAGNOSIS — F17210 Nicotine dependence, cigarettes, uncomplicated: Secondary | ICD-10-CM | POA: Diagnosis not present

## 2018-01-15 DIAGNOSIS — I10 Essential (primary) hypertension: Secondary | ICD-10-CM | POA: Insufficient documentation

## 2018-01-15 MED ORDER — SODIUM CHLORIDE 0.9 % IV BOLUS
1000.0000 mL | Freq: Once | INTRAVENOUS | Status: AC
  Administered 2018-01-16: 1000 mL via INTRAVENOUS

## 2018-01-15 MED ORDER — FENTANYL CITRATE (PF) 100 MCG/2ML IJ SOLN
50.0000 ug | Freq: Once | INTRAMUSCULAR | Status: AC
  Administered 2018-01-16: 50 ug via INTRAVENOUS
  Filled 2018-01-15: qty 2

## 2018-01-15 MED ORDER — ONDANSETRON HCL 4 MG/2ML IJ SOLN
4.0000 mg | Freq: Once | INTRAMUSCULAR | Status: AC
  Administered 2018-01-16: 4 mg via INTRAVENOUS
  Filled 2018-01-15: qty 2

## 2018-01-15 NOTE — ED Triage Notes (Signed)
Pt says she has had some right sided abdominal pain/flank pain and also has had left sided abdominal pain for 2 days. N/V/D, no fevers. Last Zofran at 1430 today.

## 2018-01-16 ENCOUNTER — Emergency Department (HOSPITAL_COMMUNITY): Payer: Medicaid Other

## 2018-01-16 ENCOUNTER — Encounter (HOSPITAL_COMMUNITY): Payer: Self-pay

## 2018-01-16 LAB — URINALYSIS, ROUTINE W REFLEX MICROSCOPIC
BILIRUBIN URINE: NEGATIVE
Glucose, UA: NEGATIVE mg/dL
Hgb urine dipstick: NEGATIVE
Ketones, ur: NEGATIVE mg/dL
Leukocytes, UA: NEGATIVE
Nitrite: NEGATIVE
Protein, ur: NEGATIVE mg/dL
SPECIFIC GRAVITY, URINE: 1.009 (ref 1.005–1.030)
pH: 6 (ref 5.0–8.0)

## 2018-01-16 LAB — CBC
HCT: 43 % (ref 36.0–46.0)
HEMOGLOBIN: 14.5 g/dL (ref 12.0–15.0)
MCH: 31.5 pg (ref 26.0–34.0)
MCHC: 33.7 g/dL (ref 30.0–36.0)
MCV: 93.5 fL (ref 78.0–100.0)
PLATELETS: 275 10*3/uL (ref 150–400)
RBC: 4.6 MIL/uL (ref 3.87–5.11)
RDW: 13.3 % (ref 11.5–15.5)
WBC: 7.3 10*3/uL (ref 4.0–10.5)

## 2018-01-16 LAB — COMPREHENSIVE METABOLIC PANEL
ALBUMIN: 3.7 g/dL (ref 3.5–5.0)
ALK PHOS: 89 U/L (ref 38–126)
ALT: 88 U/L — ABNORMAL HIGH (ref 0–44)
AST: 354 U/L — AB (ref 15–41)
Anion gap: 10 (ref 5–15)
BUN: 9 mg/dL (ref 6–20)
CALCIUM: 8.9 mg/dL (ref 8.9–10.3)
CO2: 28 mmol/L (ref 22–32)
CREATININE: 0.96 mg/dL (ref 0.44–1.00)
Chloride: 102 mmol/L (ref 98–111)
GFR calc Af Amer: >60 mL/min (ref >60)
GFR calc non Af Amer: >60 mL/min (ref >60)
GLUCOSE: 93 mg/dL (ref 70–99)
Potassium: 3.4 mmol/L — ABNORMAL LOW (ref 3.5–5.1)
SODIUM: 140 mmol/L (ref 135–145)
Total Bilirubin: 1 mg/dL (ref 0.3–1.2)
Total Protein: 6.5 g/dL (ref 6.5–8.1)

## 2018-01-16 LAB — LIPASE, BLOOD: Lipase: 86 U/L — ABNORMAL HIGH (ref 11–51)

## 2018-01-16 LAB — I-STAT BETA HCG BLOOD, ED (MC, WL, AP ONLY)

## 2018-01-16 MED ORDER — IOPAMIDOL (ISOVUE-300) INJECTION 61%
100.0000 mL | Freq: Once | INTRAVENOUS | Status: AC | PRN
  Administered 2018-01-16: 100 mL via INTRAVENOUS

## 2018-01-16 MED ORDER — IOPAMIDOL (ISOVUE-300) INJECTION 61%
INTRAVENOUS | Status: AC
  Administered 2018-01-16: 100 mL via INTRAVENOUS
  Filled 2018-01-16: qty 100

## 2018-01-16 MED ORDER — OMEPRAZOLE 20 MG PO CPDR: DELAYED_RELEASE_CAPSULE | ORAL | 0 refills | Status: DC

## 2018-01-16 MED ORDER — FENTANYL CITRATE (PF) 100 MCG/2ML IJ SOLN
50.0000 ug | Freq: Once | INTRAMUSCULAR | Status: AC
  Administered 2018-01-16: 50 ug via INTRAVENOUS
  Filled 2018-01-16: qty 2

## 2018-01-16 NOTE — ED Provider Notes (Signed)
[facility]-EMERGENCY DEPT Provider Note   CSN: [id number] Arrival date & time: 01/15/18  2302  Time seen 23:36 PM   History   Chief Complaint Chief Complaint  Patient presents with  . Abdominal Pain    HPI [name] is a 37 y.o. female.  HPI patient states she was awakened from sleep on August 28 with right upper quadrant pain and some left upper quadrant pain.  However the right upper quadrant pain is worse.  She states it does radiate into her back.  She states she had diarrhea last night and has had about 6 loose stools that she describes as green without blood.  She has had nausea and vomited twice yesterday and once today.  She denies known fever but states she is been having cold chills and hot flashes.  Patient states the pain is similar to when she had gallstones however she has had a cholecystectomy done a few years ago.  She also has had a intolerance to certain foods such as pizza for several years.  She states today she had some dizziness and lightheadedness and she was seeing some stars which prompted her ED visit.  Irving Burton history she states her mother had colon issues and she died of pancreatic cancer.  PCP [facility]  Past Medical History:  Diagnosis Date  . Anxiety   . Asthma   . Depression    doing good, not on meds now  . GERD (gastroesophageal reflux disease)   . Headache    migraines  . History of kidney stones   . Hypertension   . Kidney stones   . Missed abortion 10/02/2013  . Ovarian cyst   . Pregnancy induced hypertension   . Suicidal intent   . Vaginal Pap smear, abnormal    bx, f/u ok    Patient Active Problem List   Diagnosis Date Noted  . 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 MDD (major depressive disorder), single episode, severe , no psychosis (HCC) 07/20/2017  . Acute sinusitis with symptoms > 10 days 05/29/2017  . Postpartum  complication 06/13/2015  . Normal labor and delivery 06/06/2015  . Indication for care in labor or delivery 06/06/2015  . S/P cesarean section 06/06/2015  . Asthma, chronic 07/29/2014  . Depression 07/29/2014  . Smoking 07/29/2014  . Essential hypertension, benign 07/29/2014  . Missed abortion 10/02/2013    Past Surgical History:  Procedure Laterality Date  . CESAREAN SECTION    . CESAREAN SECTION N/A 06/06/2015   Procedure: CESAREAN SECTION;  Surgeon: Kathreen Cosier, MD;  Location: [facility] ORS;  Service: Obstetrics;  Laterality: N/A;  . CHOLECYSTECTOMY N/A 08/23/2016   Procedure: LAPAROSCOPIC CHOLECYSTECTOMY;  Surgeon: Berna Bue, MD;  Location: [facility] OR;  Service: General;  Laterality: N/A;  . DILATION AND CURETTAGE OF UTERUS    . DILATION AND EVACUATION N/A 10/04/2013   Procedure: DILATATION AND EVACUATION;  Surgeon: Sherian Rein, MD;  Location: [facility] ORS;  Service: Gynecology;  Laterality: N/A;  [place] in room   . TONSILLECTOMY    . TUBAL LIGATION       OB History    Gravida  4   Para  2   Term  2   Preterm  0   AB  2   Living  2     SAB  1   TAB  1   Ectopic  0   Multiple  0  Live Births  2            Home Medications    Patient states she has run out of her HCTZ, she is still taking her metoprolol.  She states she takes Lexapro, BuSpar, and trazodone.  Prior to Admission medications   Medication Sig Start Date End Date Taking? Authorizing Provider  atomoxetine (STRATTERA) 40 MG capsule Take 40 mg by mouth daily.   Yes [provider]  beclomethasone (QVAR) 80 MCG/ACT inhaler Inhale 2 puffs into the lungs 2 (two) times daily.   Yes [provider]  escitalopram (LEXAPRO) 20 MG tablet Take 20 mg by mouth daily.   Yes [provider]  guanFACINE (INTUNIV) 1 MG TB24 ER tablet Take 1 mg by mouth daily.   Yes [provider]  hydrochlorothiazide (HYDRODIURIL) 25 MG tablet Take 1 tablet (25 mg total) by mouth daily.  12/12/16  Yes Georgian Co M, PA-C  metoprolol tartrate (LOPRESSOR) 25 MG tablet Take 1 tablet (25 mg total) by mouth 2 (two) times daily. Patient taking differently: Take 25 mg by mouth daily.  12/12/16  Yes McClung, Marzella Schlein, PA-C  albuterol (PROVENTIL HFA;VENTOLIN HFA) 108 (90 Base) MCG/ACT inhaler Inhale 1-2 puffs into the lungs every 6 (six) hours as needed for wheezing or shortness of breath. Patient not taking: Reported on 01/16/2018 05/29/17   Wieters, [employer] C, PA-C  budesonide-formoterol (SYMBICORT) 160-4.5 MCG/ACT inhaler Inhale 2 puffs into the lungs 2 (two) times daily. Patient not taking: Reported on 05/29/2017 05/24/17   Marcine Matar, MD  escitalopram (LEXAPRO) 10 MG tablet Take 1 tablet (10 mg total) by mouth daily. For mood control Patient not taking: Reported on 01/16/2018 07/24/17   Money, Gerlene Burdock, FNP  fluticasone (FLONASE) 50 MCG/ACT nasal spray Place 2 sprays into both nostrils daily. Patient not taking: Reported on 07/19/2017 12/12/16   Anders Simmonds, PA-C  hydrOXYzine (ATARAX/VISTARIL) 25 MG tablet Take 1 tablet (25 mg total) by mouth 3 (three) times daily as needed for anxiety. Patient not taking: Reported on 01/16/2018 07/23/17   Money, Gerlene Burdock, FNP  mirtazapine (REMERON) 15 MG tablet Take 1 tablet (15 mg total) by mouth at bedtime. For mood control Patient not taking: Reported on 01/16/2018 07/23/17   Money, Gerlene Burdock, FNP  omeprazole (PRILOSEC) 20 MG capsule Take 1 po BID x 2 weeks then once a day 01/16/18   Devoria Albe, MD  traZODone (DESYREL) 50 MG tablet Take 1 tablet (50 mg total) by mouth at bedtime as needed for sleep. Patient not taking: Reported on 01/16/2018 07/23/17   Money, Gerlene Burdock, FNP    Family History Family History  Problem Relation Age of Onset  . Hearing loss Mother   . Asthma Mother   . Asthma Father   . Diabetes Son   . Cancer Maternal Grandfather        bone  . Liver disease Maternal Grandfather   . Asthma Sister   . Lupus Sister     Social  History Social History   Tobacco Use  . Smoking status: Current Some Day Smoker    Packs/day: 0.25    Years: 15.00    Pack years: 3.75    Types: Cigarettes  . Smokeless tobacco: Never Used  . Tobacco comment: Smoking .5 ppd  Substance Use Topics  . Alcohol use: Yes    Alcohol/week: 4.0 standard drinks    Types: 4 Cans of beer per week  . 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    Physical Exam Updated Vital Signs BP (!) 161/113 (BP Location: Right Arm)   Pulse 72   Temp 97.9 F (36.6 C) (Oral)   Resp 16   LMP 12/28/2017   SpO2 100%   Vital signs normal except for hypertension   Physical Exam  Constitutional: She is oriented to person, place, and time. She appears well-developed and well-nourished.  Non-toxic appearance. She does not appear ill. She appears distressed.  HENT:  Head: Normocephalic and atraumatic.  Right Ear: External ear normal.  Left Ear: External ear normal.  Nose: Nose normal. No mucosal edema or rhinorrhea.  Mouth/Throat: Oropharynx is clear and moist and mucous membranes are normal. No dental abscesses or uvula swelling.  Eyes: Pupils are equal, round, and reactive to light. Conjunctivae and EOM are normal.  Neck: Normal range of motion and full passive range of motion without pain. Neck supple.  Cardiovascular: Normal rate, regular rhythm and normal heart sounds. Exam reveals no gallop and no friction rub.  No murmur heard. Pulmonary/Chest: Effort normal and breath sounds normal. No respiratory distress. She has no wheezes. She has no rhonchi. She has no rales. She exhibits no tenderness and no crepitus.  Abdominal: Soft. Normal appearance and bowel sounds are normal. She exhibits no distension. There is tenderness in the right upper quadrant, epigastric area and left upper quadrant. There is no rebound and  no guarding.    Patient is tender diffusely in her upper abdomen but it is worse in her right upper quadrant then her left upper quadrant and less so in her epigastric area  Musculoskeletal: Normal range of motion. She exhibits no edema or tenderness.  Moves all extremities well.   Neurological: She is alert and oriented to person, place, and time. She has normal strength. No cranial nerve deficit.  Skin: Skin is warm, dry and intact. No rash noted. No erythema. No pallor.  Psychiatric: She has a normal mood and affect. Her speech is normal and behavior is normal. Her mood appears not anxious.  Nursing note and vitals reviewed.    ED Treatments / Results  Labs (all labs ordered are listed, but only abnormal results are displayed) Results for orders placed or performed during the hospital encounter of 01/15/18  Lipase, blood  Result Value Ref Range   Lipase 86 (H) 11 - 51 U/L  Comprehensive metabolic panel  Result Value Ref Range   Sodium 140 135 - 145 mmol/L   Potassium 3.4 (L) 3.5 - 5.1 mmol/L   Chloride 102 98 - 111 mmol/L   CO2 28 22 - 32 mmol/L   Glucose, Bld 93 70 - 99 mg/dL   BUN 9 6 - 20 mg/dL   Creatinine, Ser 0.340.96 0.44 - 1.00 mg/dL   Calcium 8.9 8.9 - 74.210.3 mg/dL   Total Protein 6.5 6.5 - 8.1 g/dL   Albumin 3.7 3.5 - 5.0 g/dL   AST 595354 (H) 15 - 41 U/L   ALT 88 (H) 0 - 44 U/L   Alkaline Phosphatase 89 38 - 126 U/L   Total Bilirubin 1.0 0.3 - 1.2 mg/dL   GFR calc non Af Amer >60 >60 mL/min   GFR calc Af Amer >60 >60 mL/min   Anion gap 10 5 - 15  CBC  Result Value Ref Range   WBC 7.3 4.0 -  10.5 K/uL   RBC 4.60 3.87 - 5.11 MIL/uL   Hemoglobin 14.5 12.0 - 15.0 g/dL   HCT 16.1 09.6 - 04.5 %   MCV 93.5 78.0 - 100.0 fL   MCH 31.5 26.0 - 34.0 pg   MCHC 33.7 30.0 - 36.0 g/dL   RDW 40.9 81.1 - 91.4 %   Platelets 275 150 - 400 K/uL  Urinalysis, Routine w reflex microscopic  Result Value Ref Range   Color, Urine YELLOW YELLOW   APPearance CLEAR CLEAR   Specific  Gravity, Urine 1.009 1.005 - 1.030   pH 6.0 5.0 - 8.0   Glucose, UA NEGATIVE NEGATIVE mg/dL   Hgb urine dipstick NEGATIVE NEGATIVE   Bilirubin Urine NEGATIVE NEGATIVE   Ketones, ur NEGATIVE NEGATIVE mg/dL   Protein, ur NEGATIVE NEGATIVE mg/dL   Nitrite NEGATIVE NEGATIVE   Leukocytes, UA NEGATIVE NEGATIVE  I-Stat beta hCG blood, ED  Result Value Ref Range   I-stat hCG, quantitative <5.0 <5 mIU/mL   Comment 3           Laboratory interpretation all normal except elevation of LFT's c/w alcohol induced pattern.     EKG None  Radiology Ct Abdomen Pelvis W Contrast  Result Date: 01/16/2018 CLINICAL DATA:  Right upper quadrant pain. EXAM: CT ABDOMEN AND PELVIS WITH CONTRAST TECHNIQUE: Multidetector CT imaging of the abdomen and pelvis was performed using the standard protocol following bolus administration of intravenous contrast. CONTRAST:  ISOVUE-300 IOPAMIDOL (ISOVUE-300) INJECTION 61% COMPARISON:  Ultrasound today FINDINGS: Lower chest: Lung bases are clear. No effusions. Heart is normal size. Hepatobiliary: Prior cholecystectomy. Slight intrahepatic and extrahepatic biliary ductal dilatation, likely related to post cholecystectomy state. No focal hepatic abnormality. Pancreas: Slightly prominent pancreatic duct. No visible pancreatic mass. Spleen: No focal abnormality.  Normal size. Adrenals/Urinary Tract: Areas of scarring in the right kidney which is slightly atrophic. No hydronephrosis. No renal or ureteral stones. Adrenal glands and urinary bladder unremarkable. Stomach/Bowel: Stomach, large and small bowel grossly unremarkable. Appendix normal. Vascular/Lymphatic: No evidence of aneurysm or adenopathy. Reproductive: Uterus and adnexa unremarkable.  No mass. Other: No free fluid or free air. Musculoskeletal: No acute bony abnormality. IMPRESSION: No acute findings in the abdomen or pelvis. Prior cholecystectomy. Areas of scarring in the right kidney.  No hydronephrosis. Normal  appendix. Electronically Signed   By: Charlett Nose M.D.   On: 01/16/2018 02:07   US Abdomen Limited Ruq  Result Date: 01/16/2018 CLINICAL DATA:  37 y/o F; right upper quadrant abdominal pain for 2 years. Cholecystectomy last year. EXAM: ULTRASOUND ABDOMEN LIMITED RIGHT UPPER QUADRANT COMPARISON:  07/07/2016 right upper quadrant ultrasound FINDINGS: Gallbladder: Resected. Common bile duct: Diameter: 7.3 mm Liver: No focal lesion identified. Within normal limits in parenchymal echogenicity. Portal vein is patent on color Doppler imaging with normal direction of blood flow towards the liver. IMPRESSION: Cholecystectomy. Mildly dilated common bile duct is likely compensatory post cholecystectomy. Electronically Signed   By: Mitzi Hansen M.D.   On: 01/16/2018 01:00    Procedures Procedures (including critical care time)  Medications Ordered in ED Medications  sodium chloride 0.9 % bolus 1,000 mL (0 mLs Intravenous Stopped 01/16/18 0212)  sodium chloride 0.9 % bolus 1,000 mL (0 mLs Intravenous Stopped 01/16/18 0246)  ondansetron (ZOFRAN) injection 4 mg (4 mg Intravenous Given 01/16/18 0005)  fentaNYL (SUBLIMAZE) injection 50 mcg (50 mcg Intravenous Given 01/16/18 0004)  fentaNYL (SUBLIMAZE) injection 50 mcg (50 mcg Intravenous Given 01/16/18 0144)  iopamidol (ISOVUE-300) 61 % injection 100 mL (  100 mLs Intravenous Contrast Given 01/16/18 0147)     Initial Impression / Assessment and Plan / ED Course  I have reviewed the triage vital signs and the nursing notes.  Pertinent labs & imaging results that were available during my care of the patient were reviewed by me and considered in my medical decision making (see chart for details).   Patient was given IV fluids, IV nausea and pain medication.  Ultrasound was done to look for possible retained stone after her cholecystectomy.  I reviewed her ultrasound which showed a mildly dilated common bile duct however no obvious stone was seen.  Her  LFTs were noted to be elevated and CT of the abdomen pelvis was ordered to evaluate that further.  Recheck at 4:40 AM we discussed her CT results.  Patient denies being on cholesterol medication or taking Tylenol.  She does states she drinks and has noticed when she drinks she gets more pain.  She states she had 5 beers the night before she came in.  We discussed stopping drinking.  We discussed her lab test results.  She states she is [facility].  We also discussed getting a repeat liver tests in about a month.  She was started on Prilosec for her abdominal discomfort.    Final Clinical Impressions(s) / ED Diagnoses   Final diagnoses:  RUQ pain  Abnormal liver function test    ED Discharge Orders         Ordered    omeprazole (PRILOSEC) 20 MG capsule     01/16/18 0455          Plan discharge  Devoria Albe, MD, Concha Pyo, MD 01/16/18 [phone]

## 2018-01-16 NOTE — ED Notes (Signed)
US at bedside

## 2018-01-16 NOTE — Discharge Instructions (Signed)
Stop drinking alcohol. You should have your liver tests rechecked in a month or so.  Take the prilosec as prescribed.

## 2018-02-05 ENCOUNTER — Ambulatory Visit: Payer: Self-pay | Attending: Family Medicine | Admitting: Physician Assistant

## 2018-02-05 VITALS — BP 160/100 | HR 73 | Temp 98.2°F | Resp 18 | Ht 64.0 in | Wt 178.0 lb

## 2018-02-05 DIAGNOSIS — J45909 Unspecified asthma, uncomplicated: Secondary | ICD-10-CM | POA: Insufficient documentation

## 2018-02-05 DIAGNOSIS — R945 Abnormal results of liver function studies: Secondary | ICD-10-CM

## 2018-02-05 DIAGNOSIS — Z09 Encounter for follow-up examination after completed treatment for conditions other than malignant neoplasm: Secondary | ICD-10-CM

## 2018-02-05 DIAGNOSIS — F329 Major depressive disorder, single episode, unspecified: Secondary | ICD-10-CM | POA: Insufficient documentation

## 2018-02-05 DIAGNOSIS — Z7951 Long term (current) use of inhaled steroids: Secondary | ICD-10-CM | POA: Insufficient documentation

## 2018-02-05 DIAGNOSIS — F419 Anxiety disorder, unspecified: Secondary | ICD-10-CM | POA: Insufficient documentation

## 2018-02-05 DIAGNOSIS — J40 Bronchitis, not specified as acute or chronic: Secondary | ICD-10-CM

## 2018-02-05 DIAGNOSIS — F1020 Alcohol dependence, uncomplicated: Secondary | ICD-10-CM | POA: Insufficient documentation

## 2018-02-05 DIAGNOSIS — Z880 Allergy status to penicillin: Secondary | ICD-10-CM | POA: Insufficient documentation

## 2018-02-05 DIAGNOSIS — I10 Essential (primary) hypertension: Secondary | ICD-10-CM | POA: Insufficient documentation

## 2018-02-05 DIAGNOSIS — Z888 Allergy status to other drugs, medicaments and biological substances status: Secondary | ICD-10-CM | POA: Insufficient documentation

## 2018-02-05 DIAGNOSIS — Z79899 Other long term (current) drug therapy: Secondary | ICD-10-CM | POA: Insufficient documentation

## 2018-02-05 DIAGNOSIS — Z9049 Acquired absence of other specified parts of digestive tract: Secondary | ICD-10-CM | POA: Insufficient documentation

## 2018-02-05 DIAGNOSIS — Z800 Family history of malignant neoplasm of digestive organs: Secondary | ICD-10-CM | POA: Insufficient documentation

## 2018-02-05 DIAGNOSIS — R1011 Right upper quadrant pain: Secondary | ICD-10-CM | POA: Insufficient documentation

## 2018-02-05 DIAGNOSIS — K219 Gastro-esophageal reflux disease without esophagitis: Secondary | ICD-10-CM | POA: Insufficient documentation

## 2018-02-05 DIAGNOSIS — Z87442 Personal history of urinary calculi: Secondary | ICD-10-CM | POA: Insufficient documentation

## 2018-02-05 DIAGNOSIS — R7989 Other specified abnormal findings of blood chemistry: Secondary | ICD-10-CM | POA: Insufficient documentation

## 2018-02-05 MED ORDER — BECLOMETHASONE DIPROPIONATE 80 MCG/ACT IN AERS: 2.0000 | INHALATION_SPRAY | Freq: Two times a day (BID) | RESPIRATORY_TRACT | 3 refills | Status: DC

## 2018-02-05 MED ORDER — AZITHROMYCIN 250 MG PO TABS: ORAL_TABLET | ORAL | 0 refills | Status: DC

## 2018-02-05 MED ORDER — HYDROCHLOROTHIAZIDE 25 MG PO TABS: 25.0000 mg | ORAL_TABLET | Freq: Every day | ORAL | 3 refills | Status: DC

## 2018-02-05 MED ORDER — ALBUTEROL SULFATE HFA 108 (90 BASE) MCG/ACT IN AERS: 1.0000 | INHALATION_SPRAY | Freq: Four times a day (QID) | RESPIRATORY_TRACT | 5 refills | Status: DC | PRN

## 2018-02-05 MED ORDER — METOPROLOL TARTRATE 25 MG PO TABS: 25.0000 mg | ORAL_TABLET | Freq: Two times a day (BID) | ORAL | 3 refills | Status: DC

## 2018-02-05 MED FILL — HYDROCHLOROTHIAZIDE 25 MG T: 25 | 30 days supply | Qty: 30 | Fill #0

## 2018-02-05 MED FILL — !VENTOLIN HFA INHALER: 108 (90 BAS | 25 days supply | Qty: 18 | Fill #0

## 2018-02-05 MED FILL — AZITHROMYCIN 250 MG TABLET: 250 | 5 days supply | Qty: 6 | Fill #0

## 2018-02-05 MED FILL — QVAR REDIHALER 80 MCG/ACT A: 80 | 30 days supply | Qty: 11 | Fill #0

## 2018-02-05 MED FILL — METOPROLOL TARTRATE 25 MG T: 25 | 30 days supply | Qty: 60 | Fill #0

## 2018-02-05 NOTE — Progress Notes (Signed)
Patient ID: [name], female   DOB: Dec 16, 1980, 37 y.o.   MRN: [id number]      [name], is a 37 y.o. female  WUJ:[id number]  [id number]  DOB - 1980/11/09  Subjective:  Chief Complaint and HPI: [name] is a 37 y.o. female here today to establish care and for a follow up visit After being seen in the ED 01/15/2018 with RUQ pain and elevated LFTs c/w alcohol use.  CBC was WNL. Still having some RUQ pain but prilosec is helping some.  No fever.  Has been trying to cut down on alcohol but hasn't stopped.  Her drinking has increased since last year when her mom died of pancreatic cancer.  No h/o AA or sobriety. Appetite is good.  Also c/o 1 week h/o increasing cough and some discolored mucus.  Some congestion.  CT abd/pelvis: IMPRESSION: No acute findings in the abdomen or pelvis. Prior cholecystectomy. Areas of scarring in the right kidney.  No hydronephrosis. Normal appendix.  U/S: IMPRESSION: Cholecystectomy. Mildly dilated common bile duct is likely compensatory post cholecystectomy.  From A/P:  Patient was given IV fluids, IV nausea and pain medication.  Ultrasound was done to look for possible retained stone after her cholecystectomy.  I reviewed her ultrasound which showed a mildly dilated common bile duct however no obvious stone was seen.  Her LFTs were noted to be elevated and CT of the abdomen pelvis was ordered to evaluate that further.  Recheck at 4:40 AM we discussed her CT results.  Patient denies being on cholesterol medication or taking Tylenol.  She does states she drinks and has noticed when she drinks she gets more pain.  She states she had 5 beers the night before she came in.  We discussed stopping drinking.  We discussed her lab test results.  She states she is going to follow-up at the wellness clinic.  We also discussed getting a repeat liver tests in about a month.  She was started on Prilosec for her abdominal discomfort.  ED/Hospital  notes reviewed.   Social History:  Has 3 and 35 yr old Family history:  Mom died pancreatic CA  ROS:   Constitutional:  No f/c, No night sweats, No unexplained weight loss. EENT:  No vision changes, No blurry vision, No hearing changes. No mouth, throat, or ear problems.  Respiratory: No cough, No SOB Cardiac: No CP, no palpitations GI:  + abd pain, No N/V/D. GU: No Urinary s/sx Musculoskeletal: No joint pain Neuro: No headache, no dizziness, no motor weakness.  Skin: No rash Endocrine:  No polydipsia. No polyuria.  Psych: Denies SI/HI  No problems updated.  ALLERGIES: Allergies  Allergen Reactions  . Penicillins Nausea And Vomiting and Other (See Comments)    UNSPECIFIED REACTION  Has patient had a PCN reaction causing immediate rash, facial/tongue/throat swelling, SOB or lightheadedness with hypotension:No Has patient had a PCN reaction causing severe rash involving mucus membranes or skin necrosis:No Has patient had a PCN reaction that REACTION THAT REQUIRED HOSPITALIZATION:  #  #  #  YES  #  #  #  Has patient had a PCN reaction occurring within the last 10 years: #  #  #  YES  #  #  #   . Norvasc [Amlodipine Besylate] Other (See Comments)    Hypersensitivity? Tip of tongue numb and flushing.    PAST MEDICAL HISTORY: Past Medical History:  Diagnosis Date  . Anxiety   . Asthma   . Depression  doing good, not on meds now  . GERD (gastroesophageal reflux disease)   . Headache    migraines  . History of kidney stones   . Hypertension   . Kidney stones   . Missed abortion 10/02/2013  . Ovarian cyst   . Pregnancy induced hypertension   . Suicidal intent   . Vaginal Pap smear, abnormal    bx, f/u ok    MEDICATIONS AT HOME: Prior to Admission medications   Medication Sig Start Date End Date Taking? Authorizing Provider  albuterol (PROVENTIL HFA;VENTOLIN HFA) 108 (90 Base) MCG/ACT inhaler Inhale 1-2 puffs into the lungs every 6 (six) hours as needed for wheezing or  shortness of breath. 02/05/18  Yes Anders SimmondsMcClung, Mordechai Matuszak M, PA-C  atomoxetine (STRATTERA) 40 MG capsule Take 40 mg by mouth daily.   Yes [provider]  beclomethasone (QVAR) 80 MCG/ACT inhaler Inhale 2 puffs into the lungs 2 (two) times daily. 02/05/18  Yes Georgian CoMcClung, Mikyla Schachter M, PA-C  escitalopram (LEXAPRO) 20 MG tablet Take 20 mg by mouth daily.   Yes [provider]  fluticasone (FLONASE) 50 MCG/ACT nasal spray Place 2 sprays into both nostrils daily. 12/12/16  Yes Anders SimmondsMcClung, Robson Trickey M, PA-C  guanFACINE (INTUNIV) 1 MG TB24 ER tablet Take 1 mg by mouth daily.   Yes [provider]  hydrochlorothiazide (HYDRODIURIL) 25 MG tablet Take 1 tablet (25 mg total) by mouth daily. 02/05/18  Yes Anders SimmondsMcClung, Reilly Molchan M, PA-C  hydrOXYzine (ATARAX/VISTARIL) 25 MG tablet Take 1 tablet (25 mg total) by mouth 3 (three) times daily as needed for anxiety. 07/23/17  Yes Money, Gerlene Burdockravis B, FNP  metoprolol tartrate (LOPRESSOR) 25 MG tablet Take 1 tablet (25 mg total) by mouth 2 (two) times daily. 02/05/18  Yes Anders SimmondsMcClung, Jet Traynham M, PA-C  mirtazapine (REMERON) 15 MG tablet Take 1 tablet (15 mg total) by mouth at bedtime. For mood control 07/23/17  Yes Money, Gerlene Burdockravis B, FNP  omeprazole (PRILOSEC) 20 MG capsule Take 1 po BID x 2 weeks then once a day 01/16/18  Yes Devoria AlbeKnapp, Iva, MD  traZODone (DESYREL) 50 MG tablet Take 1 tablet (50 mg total) by mouth at bedtime as needed for sleep. 07/23/17  Yes Money, Gerlene Burdockravis B, FNP  azithromycin (ZITHROMAX) 250 MG tablet Take 2 today then 1 daily 02/05/18   Anders SimmondsMcClung, Burle Kwan M, PA-C     Objective:  EXAM:   Vitals:   02/05/18 1034  BP: (!) 160/100  Pulse: 73  Resp: 18  Temp: 98.2 F (36.8 C)  TempSrc: Oral  SpO2: 97%  Weight: 178 lb (80.7 kg)  Height: 5\' 4"  (1.626 m)    General appearance : A&OX3. NAD. Non-toxic-appearing HEENT: Atraumatic and Normocephalic.  PERRLA. EOM intact.  TM clear B. Mouth-MMM, post pharynx WNL w/o erythema, No PND. Neck: supple, no JVD. No cervical  lymphadenopathy. No thyromegaly Chest/Lungs:  Breathing-non-labored, Good air entry bilaterally, breath sounds without rales or rhonchi but there is mild wheezing throughout CVS: S1 S2 regular, no murmurs, gallops, rubs  Abdomen: Bowel sounds present, Non tender and not distended with no gaurding, rigidity or rebound. Liver slightly enlarged and palpable with mild TTP Extremities: Bilateral Lower Ext shows no edema, both legs are warm to touch with = pulse throughout Neurology:  CN II-XII grossly intact, Non focal.   Psych:  TP linear. J/I WNL. Normal speech. Appropriate eye contact and affect.  Skin:  No Rash  Data Review Lab Results  Component Value Date   HGBA1C 5.2 07/29/2014     Assessment &  Plan   1. Elevated LFTs Alcohol cessation imperative - Comprehensive metabolic panel  2. Hospital discharge follow-up improving  3. Hypertension, unspecified type Uncontrolled but out of meds-resume meds - metoprolol tartrate (LOPRESSOR) 25 MG tablet; Take 1 tablet (25 mg total) by mouth 2 (two) times daily.  Dispense: 180 tablet; Refill: 3 - hydrochlorothiazide (HYDRODIURIL) 25 MG tablet; Take 1 tablet (25 mg total) by mouth daily.  Dispense: 90 tablet; Refill: 3  4. Essential hypertension, benign - metoprolol tartrate (LOPRESSOR) 25 MG tablet; Take 1 tablet (25 mg total) by mouth 2 (two) times daily.  Dispense: 180 tablet; Refill: 3 - hydrochlorothiazide (HYDRODIURIL) 25 MG tablet; Take 1 tablet (25 mg total) by mouth daily.  Dispense: 90 tablet; Refill: 3  5. Chronic asthma without complication, unspecified asthma severity, unspecified whether persistent - beclomethasone (QVAR) 80 MCG/ACT inhaler; Inhale 2 puffs into the lungs 2 (two) times daily.  Dispense: 1 Inhaler; Refill: 3 - albuterol (PROVENTIL HFA;VENTOLIN HFA) 108 (90 Base) MCG/ACT inhaler; Inhale 1-2 puffs into the lungs every 6 (six) hours as needed for wheezing or shortness of breath.  Dispense: 1 Inhaler; Refill: 5  6.  Bronchitis Cover for atypicals - azithromycin (ZITHROMAX) 250 MG tablet; Take 2 today then 1 daily  Dispense: 6 tablet; Refill: 0  7. Alcohol use disorder, severe, dependence (HCC) I have counseled the patient at length about substance abuse and addiction.  12 step meetings/recovery recommended.  Local 12 step meeting lists were given and attendance was encouraged.  Patient expresses understanding.  I spent >30 mins face to face on this issue.       Patient have been counseled extensively about nutrition and exercise  Return in about 1 month (around 03/07/2018) for assign PCP; f/up elevated LFT and asthma.  The patient was given clear instructions to go to ER or return to medical center if symptoms don't improve, worsen or new problems develop. The patient verbalized understanding. The patient was told to call to get lab results if they haven't heard anything in the next week.     Georgian Co, PA-C [facility] [place], [place] [phone]   02/05/2018, 11:02 AM

## 2018-02-06 LAB — COMPREHENSIVE METABOLIC PANEL
A/G RATIO: 1.8 (ref 1.2–2.2)
ALK PHOS: 124 IU/L — AB (ref 39–117)
ALT: 18 IU/L (ref 0–32)
AST: 27 IU/L (ref 0–40)
Albumin: 4.3 g/dL (ref 3.5–5.5)
BUN/Creatinine Ratio: 13 (ref 9–23)
BUN: 12 mg/dL (ref 6–20)
Bilirubin Total: 0.4 mg/dL (ref 0.0–1.2)
CO2: 20 mmol/L (ref 20–29)
Calcium: 9.8 mg/dL (ref 8.7–10.2)
Chloride: 104 mmol/L (ref 96–106)
Creatinine, Ser: 0.96 mg/dL (ref 0.57–1.00)
GFR calc Af Amer: 89 mL/min/{1.73_m2} (ref >59)
GFR, EST NON AFRICAN AMERICAN: 77 mL/min/{1.73_m2} (ref >59)
GLOBULIN, TOTAL: 2.4 g/dL (ref 1.5–4.5)
Glucose: 91 mg/dL (ref 65–99)
POTASSIUM: 5 mmol/L (ref 3.5–5.2)
SODIUM: 139 mmol/L (ref 134–144)
Total Protein: 6.7 g/dL (ref 6.0–8.5)

## 2018-02-09 ENCOUNTER — Telehealth: Payer: Self-pay | Admitting: *Deleted

## 2018-02-09 NOTE — Telephone Encounter (Signed)
Patient verified DOB Patient is aware of liver function being back to normal and to remain off the alcohol to keep the organ functioning properly. No further questions.

## 2018-02-09 NOTE — Telephone Encounter (Signed)
-----   Message from Anders SimmondsAngela M McClung, [place]PA-C sent at 02/06/2018  1:36 PM EDT ----- Your liver function is back to normal.  This is wonderful news!  Stay away from alcohol to keep your liver healthy and functioning properly.  Follow-up as planned.  Thanks, Georgian CoAngela McClung, PA-C

## 2018-02-27 ENCOUNTER — Other Ambulatory Visit: Payer: Self-pay | Admitting: Physician Assistant

## 2018-02-27 DIAGNOSIS — I10 Essential (primary) hypertension: Secondary | ICD-10-CM

## 2018-03-13 ENCOUNTER — Other Ambulatory Visit: Payer: Self-pay | Admitting: Physician Assistant

## 2018-03-13 DIAGNOSIS — I10 Essential (primary) hypertension: Secondary | ICD-10-CM

## 2018-03-21 ENCOUNTER — Encounter (HOSPITAL_COMMUNITY): Payer: Self-pay | Admitting: *Deleted

## 2018-03-21 ENCOUNTER — Emergency Department (HOSPITAL_COMMUNITY)
Admission: EM | Admit: 2018-03-21 | Discharge: 2018-03-21 | Disposition: A | Discharge status: Home / Self Care | Payer: Medicaid Other | Attending: Emergency Medicine | Admitting: Emergency Medicine

## 2018-03-21 ENCOUNTER — Emergency Department (HOSPITAL_COMMUNITY): Payer: Medicaid Other

## 2018-03-21 DIAGNOSIS — F17210 Nicotine dependence, cigarettes, uncomplicated: Secondary | ICD-10-CM | POA: Diagnosis not present

## 2018-03-21 DIAGNOSIS — W19XXXA Unspecified fall, initial encounter: Secondary | ICD-10-CM | POA: Insufficient documentation

## 2018-03-21 DIAGNOSIS — Y9389 Activity, other specified: Secondary | ICD-10-CM | POA: Insufficient documentation

## 2018-03-21 DIAGNOSIS — Z79899 Other long term (current) drug therapy: Secondary | ICD-10-CM | POA: Insufficient documentation

## 2018-03-21 DIAGNOSIS — I10 Essential (primary) hypertension: Secondary | ICD-10-CM | POA: Insufficient documentation

## 2018-03-21 DIAGNOSIS — Y998 Other external cause status: Secondary | ICD-10-CM | POA: Diagnosis not present

## 2018-03-21 DIAGNOSIS — Y9289 Other specified places as the place of occurrence of the external cause: Secondary | ICD-10-CM | POA: Diagnosis not present

## 2018-03-21 DIAGNOSIS — F10230 Alcohol dependence with withdrawal, uncomplicated: Secondary | ICD-10-CM | POA: Diagnosis present

## 2018-03-21 DIAGNOSIS — S0990XA Unspecified injury of head, initial encounter: Secondary | ICD-10-CM | POA: Diagnosis not present

## 2018-03-21 DIAGNOSIS — J45909 Unspecified asthma, uncomplicated: Secondary | ICD-10-CM | POA: Insufficient documentation

## 2018-03-21 MED ORDER — CHLORDIAZEPOXIDE HCL 25 MG PO CAPS: ORAL_CAPSULE | ORAL | 0 refills | Status: DC

## 2018-03-21 MED ORDER — LORAZEPAM 0.5 MG PO TABS
0.5000 mg | ORAL_TABLET | Freq: Once | ORAL | Status: AC
  Administered 2018-03-21: 0.5 mg via ORAL
  Filled 2018-03-21: qty 1

## 2018-03-21 MED ORDER — ONDANSETRON 4 MG PO TBDP: 4.0000 mg | ORAL_TABLET | Freq: Three times a day (TID) | ORAL | 0 refills | Status: DC | PRN

## 2018-03-21 NOTE — ED Triage Notes (Addendum)
Pt states she would like to stop drinking alcohol. Pt also complains of depression and anxiety. Pt last drank last night, states she usually drinks ~8 beers a day. Pt states she usually has tremors and nausea when she stops drinking. Pt states she fell and hit her head 4 days ago while she was intoxicated.

## 2018-03-21 NOTE — ED Notes (Signed)
Bed: [facility] Expected date:  Expected time:  Means of arrival:  Comments: 

## 2018-03-21 NOTE — Discharge Instructions (Signed)
Take the Librium taper as prescribed.  Do not drink any alcohol while you are taking the Librium.  You can also take Zofran as needed for nausea and vomiting.  Let this medicine dissolve under your tongue, then wait 20 to 30 minutes before having anything to eat or drink to give this medicine time to work.  Drink plenty of fluids and get plenty of rest.  I have attached resources for follow-up with different facilities on an outpatient basis for management of your alcohol detox.  Return to the emergency department if any concerning signs or symptoms develop such as fevers, seizures, loss of consciousness, or severe pain.

## 2018-03-21 NOTE — ED Provider Notes (Signed)
[facility]-EMERGENCY DEPT Provider Note   CSN: [id number] Arrival date & time: 03/21/18  1412     History   Chief Complaint Chief Complaint  Patient presents with  . Alcohol Problem    HPI [name] is a 37 y.o. female with history of anxiety, asthma, depression, GERD, migraines, nephrolithiasis, hypertension presents for evaluation of gradual onset, intermittent generalized headaches for 4 days.  She is also presenting requesting detox for alcohol.  She states that she drinks around 8 beers daily, sometimes wine and occasionally liquor.  Occasionally smokes marijuana but denies any other recreational drug use.  Is a current smoker of approximately a pack of cigarettes daily.  She denies suicidal ideation or homicidal ideation or auditory or visual hallucinations.  Last alcoholic beverage was around 8pm yesterday.  Currently feels anxious, itchy, diaphoretic, nauseous.  Denies vomiting.    She states that when she is intoxicated she will injure herself accidentally.  4 days ago she awoke with some periorbital ecchymosis and was told that she fell and hit her head.  Since then she has had generalized throbbing headaches.  Denies vision changes, numbness or weakness.  The history is provided by the patient.    Past Medical History:  Diagnosis Date  . Anxiety   . Asthma   . Depression    doing good, not on meds now  . GERD (gastroesophageal reflux disease)   . Headache    migraines  . History of kidney stones   . Hypertension   . Kidney stones   . Missed abortion 10/02/2013  . Ovarian cyst   . Pregnancy induced hypertension   . Suicidal intent   . Vaginal Pap smear, abnormal    bx, f/u ok    Patient Active Problem List   Diagnosis Date Noted  . 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 MDD (major depressive disorder), single episode, severe , no psychosis (HCC) 07/20/2017  . Acute sinusitis with  symptoms > 10 days 05/29/2017  . Postpartum complication 06/13/2015  . Normal labor and delivery 06/06/2015  . Indication for care in labor or delivery 06/06/2015  . S/P cesarean section 06/06/2015  . Asthma, chronic 07/29/2014  . Depression 07/29/2014  . Smoking 07/29/2014  . Essential hypertension, benign 07/29/2014  . Missed abortion 10/02/2013    Past Surgical History:  Procedure Laterality Date  . CESAREAN SECTION    . CESAREAN SECTION N/A 06/06/2015   Procedure: CESAREAN SECTION;  Surgeon: Kathreen Cosier, MD;  Location: [facility];  Service: Obstetrics;  Laterality: N/A;  . CHOLECYSTECTOMY N/A 08/23/2016   Procedure: LAPAROSCOPIC CHOLECYSTECTOMY;  Surgeon: Berna Bue, MD;  Location: [facility] OR;  Service: General;  Laterality: N/A;  . DILATION AND CURETTAGE OF UTERUS    . DILATION AND EVACUATION N/A 10/04/2013   Procedure: DILATATION AND EVACUATION;  Surgeon: Sherian Rein, MD;  Location: [facility];  Service: Gynecology;  Laterality: N/A;  [place] in room   . TONSILLECTOMY    . TUBAL LIGATION       OB History    Gravida  4   Para  2   Term  2   Preterm  0   AB  2   Living  2     SAB  1   TAB  1   Ectopic  0   Multiple  0   Live Births  2            Home  Medications    Prior to Admission medications   Medication Sig Start Date End Date Taking? Authorizing Provider  albuterol (PROVENTIL HFA;VENTOLIN HFA) 108 (90 Base) MCG/ACT inhaler Inhale 1-2 puffs into the lungs every 6 (six) hours as needed for wheezing or shortness of breath. 02/05/18  Yes Georgian Co M, PA-C  ARIPiprazole (ABILIFY) 5 MG tablet Take 5 mg by mouth daily.   Yes [provider]  atomoxetine (STRATTERA) 40 MG capsule Take 40 mg by mouth daily.   Yes [provider]  beclomethasone (QVAR) 80 MCG/ACT inhaler Inhale 2 puffs into the lungs 2 (two) times daily. 02/05/18  Yes McClung, Angela M, PA-C  BIOTIN MAXIMUM PO Take 1 tablet by mouth daily.   Yes [provider]  escitalopram (LEXAPRO) 20 MG tablet Take 20 mg by mouth daily.   Yes [provider]  guanFACINE (INTUNIV) 1 MG TB24 ER tablet Take 1 mg by mouth daily.   Yes [provider]  hydrochlorothiazide (HYDRODIURIL) 25 MG tablet Take 1 tablet (25 mg total) by mouth daily. 02/05/18  Yes Georgian Co M, PA-C  metoprolol tartrate (LOPRESSOR) 25 MG tablet Take 1 tablet (25 mg total) by mouth 2 (two) times daily. 02/05/18  Yes Anders Simmonds, PA-C  Multiple Vitamin (MULTIVITAMIN WITH MINERALS) TABS tablet Take 1 tablet by mouth daily.   Yes [provider]  omeprazole (PRILOSEC) 20 MG capsule Take 1 po BID x 2 weeks then once a day 01/16/18  Yes Devoria Albe, MD  traZODone (DESYREL) 50 MG tablet Take 1 tablet (50 mg total) by mouth at bedtime as needed for sleep. Patient taking differently: Take 100 mg by mouth at bedtime as needed for sleep.  07/23/17  Yes Money, Gerlene Burdock, FNP  azithromycin (ZITHROMAX) 250 MG tablet Take 2 today then 1 daily Patient not taking: Reported on 03/21/2018 02/05/18   Anders Simmonds, PA-C  chlordiazePOXIDE (LIBRIUM) 25 MG capsule 50mg  PO TID x 1D, then 25-50mg  PO BID X 1D, then 25-50mg  PO QD X 1D 03/21/18   Rodell Marrs A, PA-C  fluticasone (FLONASE) 50 MCG/ACT nasal spray Place 2 sprays into both nostrils daily. Patient not taking: Reported on 03/21/2018 12/12/16   Anders Simmonds, PA-C  hydrOXYzine (ATARAX/VISTARIL) 25 MG tablet Take 1 tablet (25 mg total) by mouth 3 (three) times daily as needed for anxiety. Patient not taking: Reported on 03/21/2018 07/23/17   Money, Gerlene Burdock, FNP  mirtazapine (REMERON) 15 MG tablet Take 1 tablet (15 mg total) by mouth at bedtime. For mood control Patient not taking: Reported on 03/21/2018 07/23/17   Money, Gerlene Burdock, FNP  ondansetron (ZOFRAN ODT) 4 MG disintegrating tablet Take 1 tablet (4 mg total) by mouth every 8 (eight) hours as needed for nausea or vomiting. 03/21/18   Jeanie Sewer, PA-C    Family  History Family History  Problem Relation Age of Onset  . Hearing loss Mother   . Asthma Mother   . Asthma Father   . Diabetes Son   . Cancer Maternal Grandfather        bone  . Liver disease Maternal Grandfather   . Asthma Sister   . Lupus Sister     Social History Social History   Tobacco Use  . Smoking status: Current Some Day Smoker    Packs/day: 0.50    Years: 15.00    Pack years: 7.50    Types: Cigarettes  . Smokeless tobacco: Never Used  . Tobacco comment: Smoking .5  ppd  Substance Use Topics  . Alcohol use: Yes    Alcohol/week: 4.0 standard drinks    Types: 4 Cans of beer per week  . Drug use: No     Allergies   Penicillins and Norvasc [amlodipine besylate]   Review of Systems Review of Systems  Constitutional: Positive for chills and diaphoresis. Negative for fever.  Respiratory: Negative for shortness of breath.   Cardiovascular: Negative for chest pain.  Gastrointestinal: Positive for nausea. Negative for abdominal pain and vomiting.  Neurological: Positive for headaches. Negative for weakness and light-headedness.  All other systems reviewed and are negative.    Physical Exam Updated Vital Signs BP (!) 148/110   Pulse 91   Temp 98.2 F (36.8 C) (Oral)   Resp 18   LMP 03/21/2018   SpO2 100%   Physical Exam  Constitutional: She is oriented to person, place, and time. She appears well-developed and well-nourished. No distress.  HENT:  Head: Normocephalic and atraumatic.  No Battle's signs, no raccoon's eyes, no rhinorrhea. No hemotympanum. No tenderness to palpation of the face.  Diffuse tenderness to palpation of the skull with no deformity, crepitus, or swelling noted.   Eyes: Pupils are equal, round, and reactive to light. Conjunctivae and EOM are normal. Right eye exhibits no discharge. Left eye exhibits no discharge.  Neck: No JVD present. No tracheal deviation present.  Cardiovascular: Normal rate, regular rhythm, normal heart sounds and  intact distal pulses.  Pulmonary/Chest: Effort normal and breath sounds normal.  Abdominal: Soft. Bowel sounds are normal. She exhibits no distension.  Musculoskeletal: She exhibits no edema.  No midline spine TTP, no paraspinal muscle tenderness, no deformity, crepitus, or step-off noted. 5/5 strength of BUE and BLE major muscle groups.   Neurological: She is alert and oriented to person, place, and time. No cranial nerve deficit or sensory deficit. She exhibits normal muscle tone.  Mental Status:  Alert, thought content appropriate, able to give a coherent history. Speech fluent without evidence of aphasia. Able to follow 2 step commands without difficulty.  Cranial Nerves:  II:  Peripheral visual fields grossly normal, pupils equal, round, reactive to light III,IV, VI: ptosis not present, extra-ocular motions intact bilaterally  V,VII: smile symmetric, facial light touch sensation equal VIII: hearing grossly normal to voice  X: uvula elevates symmetrically  XI: bilateral shoulder shrug symmetric and strong XII: midline tongue extension without fassiculations Motor:  Normal tone. 5/5 strength of BUE and BLE major muscle groups including strong and equal grip strength and dorsiflexion/plantar flexion Sensory: light touch normal in all extremities. Gait: normal gait and balance. Able to walk on toes and heels with ease.     Skin: Skin is warm and dry. No erythema.  Psychiatric: Her behavior is normal. Her mood appears anxious. She is not actively hallucinating. She expresses no homicidal and no suicidal ideation. She expresses no suicidal plans and no homicidal plans.  Nursing note and vitals reviewed.    ED Treatments / Results  Labs (all labs ordered are listed, but only abnormal results are displayed) Labs Reviewed - No data to display  EKG None  Radiology Ct Head Wo Contrast  Result Date: 03/21/2018 CLINICAL DATA:  Recent fall with head injury, initial encounter EXAM: CT  HEAD WITHOUT CONTRAST TECHNIQUE: Contiguous axial images were obtained from the base of the skull through the vertex without intravenous contrast. COMPARISON:  None. FINDINGS: Brain: No evidence of acute infarction, hemorrhage, hydrocephalus, extra-axial collection or mass lesion/mass effect. Vascular: No hyperdense  vessel or unexpected calcification. Skull: Normal. Negative for fracture or focal lesion. Sinuses/Orbits: No acute finding. Other: None. IMPRESSION: Normal head CT Electronically Signed   By: Alcide Clever M.D.   On: 03/21/2018 17:31    Procedures Procedures (including critical care time)  Medications Ordered in ED Medications  LORazepam (ATIVAN) tablet 0.5 mg (0.5 mg Oral Given 03/21/18 1746)     Initial Impression / Assessment and Plan / ED Course  I have reviewed the triage vital signs and the nursing notes.  Pertinent labs & imaging results that were available during my care of the patient were reviewed by me and considered in my medical decision making (see chart for details).     Patient presents requesting detox from alcohol.  She is afebrile, mildly hypertensive in the ED.  She does appear anxious.  She is nontoxic in appearance otherwise.  She does note that she injured her head 4 days ago while intoxicated.  No focal neurologic deficits on examination and she is ambulatory without difficulty.  Will obtain head CT for rule out of any acute intracranial abnormality.  She has never had seizures related to alcohol withdrawals and has never required admission for alcohol detox.  She denies any suicidal ideation or homicidal ideation does not appear to be an imminent threat to herself or others at this time.  CIWA score of 7.  Anxiety improved with Ativan.  Head CT shows no acute intracranial abnormalities.  On reevaluation she is resting comfortably no apparent distress.  Peer support specialist were consulted and will recheck to the patient.  She was also given Librium taper,  Zofran for nausea, and resources for follow-up on her alcohol abuse on an outpatient basis.  No evidence of DTs.  Discussed strict ED return precautions.  Patient and patient's friend verbalized understanding of and agreement with plan and patient is stable for discharge home at this time.  Final Clinical Impressions(s) / ED Diagnoses   Final diagnoses:  Alcohol dependence with uncomplicated withdrawal (HCC)  Injury of head, initial encounter    ED Discharge Orders         Ordered    chlordiazePOXIDE (LIBRIUM) 25 MG capsule     03/21/18 1750    ondansetron (ZOFRAN ODT) 4 MG disintegrating tablet  Every 8 hours PRN     03/21/18 1750           Jeanie Sewer, PA-C 03/21/18 1759    Terrilee Files, MD 03/22/18 1257

## 2018-03-31 ENCOUNTER — Other Ambulatory Visit: Payer: Self-pay | Admitting: Physician Assistant

## 2018-03-31 DIAGNOSIS — I10 Essential (primary) hypertension: Secondary | ICD-10-CM

## 2018-04-06 ENCOUNTER — Ambulatory Visit: Payer: Medicaid Other | Attending: Family Medicine | Admitting: Family Medicine

## 2018-04-06 ENCOUNTER — Other Ambulatory Visit: Payer: Self-pay | Admitting: Physician Assistant

## 2018-04-06 VITALS — BP 121/84 | HR 100 | Temp 98.5°F | Resp 18 | Ht 64.0 in | Wt 182.0 lb

## 2018-04-06 DIAGNOSIS — R1024 Suprapubic pain: Secondary | ICD-10-CM

## 2018-04-06 DIAGNOSIS — M546 Pain in thoracic spine: Secondary | ICD-10-CM | POA: Insufficient documentation

## 2018-04-06 DIAGNOSIS — Z87442 Personal history of urinary calculi: Secondary | ICD-10-CM | POA: Diagnosis not present

## 2018-04-06 DIAGNOSIS — F17210 Nicotine dependence, cigarettes, uncomplicated: Secondary | ICD-10-CM | POA: Insufficient documentation

## 2018-04-06 DIAGNOSIS — F1021 Alcohol dependence, in remission: Secondary | ICD-10-CM | POA: Insufficient documentation

## 2018-04-06 DIAGNOSIS — J45909 Unspecified asthma, uncomplicated: Secondary | ICD-10-CM | POA: Insufficient documentation

## 2018-04-06 DIAGNOSIS — F329 Major depressive disorder, single episode, unspecified: Secondary | ICD-10-CM | POA: Diagnosis not present

## 2018-04-06 DIAGNOSIS — I10 Essential (primary) hypertension: Secondary | ICD-10-CM

## 2018-04-06 DIAGNOSIS — G8929 Other chronic pain: Secondary | ICD-10-CM

## 2018-04-06 DIAGNOSIS — Z79899 Other long term (current) drug therapy: Secondary | ICD-10-CM | POA: Diagnosis not present

## 2018-04-06 DIAGNOSIS — R1013 Epigastric pain: Secondary | ICD-10-CM

## 2018-04-06 DIAGNOSIS — R1011 Right upper quadrant pain: Secondary | ICD-10-CM

## 2018-04-06 DIAGNOSIS — K219 Gastro-esophageal reflux disease without esophagitis: Secondary | ICD-10-CM | POA: Diagnosis not present

## 2018-04-06 DIAGNOSIS — F419 Anxiety disorder, unspecified: Secondary | ICD-10-CM | POA: Diagnosis not present

## 2018-04-06 DIAGNOSIS — M549 Dorsalgia, unspecified: Secondary | ICD-10-CM

## 2018-04-06 DIAGNOSIS — R102 Pelvic and perineal pain: Secondary | ICD-10-CM

## 2018-04-06 LAB — POCT URINALYSIS DIP (CLINITEK)
Bilirubin, UA: NEGATIVE
Blood, UA: NEGATIVE
Glucose, UA: NEGATIVE mg/dL
Ketones, POC UA: NEGATIVE mg/dL
Leukocytes, UA: NEGATIVE
Nitrite, UA: NEGATIVE
POC PROTEIN,UA: NEGATIVE
Spec Grav, UA: 1.01
Urobilinogen, UA: 0.2 U/dL
pH, UA: 6.5

## 2018-04-06 MED ORDER — OMEPRAZOLE 40 MG PO CPDR: 40.0000 mg | DELAYED_RELEASE_CAPSULE | Freq: Every day | ORAL | 3 refills | Status: DC

## 2018-04-06 MED ORDER — SULFAMETHOXAZOLE-TRIMETHOPRIM 800-160 MG PO TABS: 1.0000 | ORAL_TABLET | Freq: Two times a day (BID) | ORAL | 0 refills | Status: AC

## 2018-04-06 MED ORDER — METOPROLOL TARTRATE 25 MG PO TABS: 25.0000 mg | ORAL_TABLET | Freq: Two times a day (BID) | ORAL | 1 refills | Status: DC

## 2018-04-06 MED ORDER — HYDROCHLOROTHIAZIDE 25 MG PO TABS: 25.0000 mg | ORAL_TABLET | Freq: Every day | ORAL | 1 refills | Status: DC

## 2018-04-06 NOTE — Progress Notes (Signed)
Subjective:    Patient ID: [name], female    DOB: [birth year], 37 y.o.   MRN: [id number]  HPI        37 yo female new to me as a patient who is seen in follow-up of recent emergency department visit on 03/21/2018 secondary to alcohol dependence.  Patient has previously been seen here by another provider on 02/05/2018 secondary to right upper quadrant pain and elevated LFTs.  Patient reports at today's visit that she has not had any alcohol in the past 2 weeks.  Patient states that she decided that she wanted to stop drinking because she has children.  Patient reports that not drinking has caused her to have increased anxiety.  Patient reports that she is being followed at [facility] services for depression but states that so far they have not given her anything to help with anxiety.      Patient continues to have issues with right upper quadrant pain and occasional nausea.  Patient states that she has had her gallbladder removed.  Patient also today's visit with complaint of urinary frequency and mid back pain.  Patient reports that she will feel as if she has to urinate but then seems to not put out a lot of urine.  Patient denies any dysuria.  Patient has seen no blood in the urine.  Patient does have a history of kidney stones.  Patient also states that during a recent hospitalization she had an ultrasound done and was told that she had evidence of prior damage to her kidneys.      Patient reports that she is also presenting today as she is out of metoprolol on hydrochlorothiazide for treatment of her blood pressure.  Patient denies any headaches or dizziness related to her blood pressure.  Patient does have fatigue. Past Medical History:  Diagnosis Date  . Anxiety   . Asthma   . Depression    doing good, not on meds now  . GERD (gastroesophageal reflux disease)   . Headache    migraines  . History of kidney stones   . Hypertension   . Kidney stones   . Missed abortion  10/02/2013  . Ovarian cyst   . Pregnancy induced hypertension   . Suicidal intent   . Vaginal Pap smear, abnormal    bx, f/u ok   Past Surgical History:  Procedure Laterality Date  . CESAREAN SECTION    . CESAREAN SECTION N/A 06/06/2015   Procedure: CESAREAN SECTION;  Surgeon: Kathreen Cosier, MD;  Location: [facility];  Service: Obstetrics;  Laterality: N/A;  . CHOLECYSTECTOMY N/A 08/23/2016   Procedure: LAPAROSCOPIC CHOLECYSTECTOMY;  Surgeon: Berna Bue, MD;  Location: [facility] OR;  Service: General;  Laterality: N/A;  . DILATION AND CURETTAGE OF UTERUS    . DILATION AND EVACUATION N/A 10/04/2013   Procedure: DILATATION AND EVACUATION;  Surgeon: Sherian Rein, MD;  Location: [facility];  Service: Gynecology;  Laterality: N/A;  [place] in room   . TONSILLECTOMY    . TUBAL LIGATION     Family History  Problem Relation Age of Onset  . Hearing loss Mother   . Asthma Mother   . Asthma Father   . Diabetes Son   . Cancer Maternal Grandfather        bone  . Liver disease Maternal Grandfather   . Asthma Sister   . Lupus Sister    Social History   Tobacco Use  . Smoking status: Current Some  Day Smoker    Packs/day: 0.50    Years: 15.00    Pack years: 7.50    Types: Cigarettes  . Smokeless tobacco: Never Used  . Tobacco comment: Smoking .5 ppd  Substance Use Topics  . Alcohol use: Yes    Alcohol/week: 4.0 standard drinks    Types: 4 Cans of beer per week  . Drug use: No  At today's visit, patient reports that she has not had any use of alcohol in the past 2 weeks. Allergies  Allergen Reactions  . Penicillins Nausea And Vomiting and Other (See Comments)    UNSPECIFIED REACTION  Has patient had a PCN reaction causing immediate rash, facial/tongue/throat swelling, SOB or lightheadedness with hypotension:No Has patient had a PCN reaction causing severe rash involving mucus membranes or skin necrosis:No Has patient had a PCN reaction that REACTION THAT REQUIRED HOSPITALIZATION:  #  #   #  YES  #  #  #  Has patient had a PCN reaction occurring within the last 10 years: #  #  #  YES  #  #  #   . Norvasc [Amlodipine Besylate] Other (See Comments)    Hypersensitivity? Tip of tongue numb and flushing.      Review of Systems  Constitutional: Positive for fatigue. Negative for chills and fever.  HENT: Negative for sore throat and trouble swallowing.   Eyes: Negative for photophobia and visual disturbance.  Respiratory: Negative for cough and shortness of breath.   Cardiovascular: Negative for chest pain, palpitations and leg swelling.  Gastrointestinal: Positive for abdominal pain and nausea. Negative for blood in stool, constipation and diarrhea.  Genitourinary: Positive for flank pain and frequency. Negative for dysuria and hematuria.  Musculoskeletal: Positive for back pain. Negative for arthralgias and gait problem.  Neurological: Negative for dizziness and headaches.  Psychiatric/Behavioral: Positive for sleep disturbance. Negative for self-injury and suicidal ideas. The patient is nervous/anxious.        Objective:   Physical Exam BP 121/84 (BP Location: Right Arm, Patient Position: Sitting, Cuff Size: Large)   Pulse 100   Temp 98.5 F (36.9 C) (Oral)   Resp 18   Ht 5\' 4"  (1.626 m)   Wt 182 lb (82.6 kg)   LMP 03/21/2018   SpO2 97%   BMI 31.24 kg/m Nurse's notes and vital signs reviewed General- well-nourished, well-developed female in no acute distress Neck-supple, no lymphadenopathy, no thyromegaly Lungs-clear to auscultation bilaterally Cardiovascular-regular rate and rhythm Abdomen- positive epigastric tenderness to palpation.  Mild right upper quadrant tenderness to palpation, no rebound or guarding patient also with mild abdominal distention and patient with mild suprapubic discomfort to palpation, no rebound or guarding Back- patient with positive CVA tenderness bilaterally but most pronounced on the right Extremities-no edema Psych-normal mood and  judgment       Assessment & Plan:  1. Epigastric pain Patient with epigastric tenderness on exam.  Patient reports that she was on omeprazole in the past which did seem to help with epigastric pain.  Patient denies any reflux type symptoms other than some occasional nausea.  Patient is asked to avoid late night eating as well as avoidance of known trigger foods.  Patient however also has a history of alcohol abuse/dependence but has not had any alcohol in the past 2 weeks.  On review of chart, patient has had prior elevated lipase and patient will have repeat lipase at today's visit to see if pancreatitis may also be responsible for her upper abdominal/epigastric tenderness. -  omeprazole (PRILOSEC) 40 MG capsule; Take 1 capsule (40 mg total) by mouth daily.  Dispense: 30 capsule; Refill: 3 - Lipase  2. Suprapubic discomfort Patient with suprapubic discomfort on examination and patient will have urinalysis.  Unfortunately patient urinated slightly before her visit and then had to drink water during the visit before she did give a sample.  Patient's UA was negative however based on her symptoms, patient will be placed on Bactrim DS twice daily x3 days and should follow-up if she continues to have symptoms. - POCT URINALYSIS DIP (CLINITEK) - sulfamethoxazole-trimethoprim (BACTRIM DS,SEPTRA DS) 800-160 MG tablet; Take 1 tablet by mouth 2 (two) times daily for 3 days.  Dispense: 6 tablet; Refill: 0  3. Mid back pain on right side Patient with right CVA tenderness and history of kidney stones but patient states that her current pain does not feel as severe as passed kidney stones.  Patient will have urinalysis to look for possible urinary tract infection as the source of patient's pain - POCT URINALYSIS DIP (CLINITEK)  4. Essential hypertension, benign Patient reports that she is out of hydrochlorothiazide and metoprolol.  These medications are being refilled at today's visit however patient's blood  pressure at today's visit is actually good at 121/84 and patient may likely be able to stop the hydrochlorothiazide at some point if her blood pressure remains well controlled or if she become symptomatic such as dizziness or lightheadedness.  Patient has had prior ultrasound that did mention presence of renal scarring.  At next visit, patient will have BMP and urine microalbumin to look for elevated creatinine or microalbuminuria and patient may require repeat ultrasound or nephrology follow-up if these are abnormal. - hydrochlorothiazide (HYDRODIURIL) 25 MG tablet; Take 1 tablet (25 mg total) by mouth daily.  Dispense: 90 tablet; Refill: 1 - metoprolol tartrate (LOPRESSOR) 25 MG tablet; Take 1 tablet (25 mg total) by mouth 2 (two) times daily.  Dispense: 180 tablet; Refill: 1  5.  Chronic right upper quadrant pain Patient reports issues with right upper quadrant pain since she had removal of her gallbladder.  Patient has had abdominal CT scan and ultrasound of the right upper quadrant.  I discussed with the patient that if she continues to have right upper quadrant pain that she may benefit from GI referral for possible ERCP to make sure that there are no retained stones.  Patient's most recent CMP done on 02/05/2018 did not show any abnormalities in LFTs other than a mild increase in alkaline phosphatase at 124.  *Patient with history of depression and patient reports recent increase in anxiety status post alcohol cessation.  Patient with an abnormal PHQ-9  Screen and social work consult was offered.  Patient states that she does not believe that she needs to speak with social worker today as she is [facility]  Social worker was given patient's name and PHQ 9 information to contact patient by phone to offer resources/follow-up if needed.  An After Visit Summary was printed and given to the patient.  Return in about 6 weeks (around 05/18/2018) for  4-6 weeks if not feeling better.

## 2018-04-07 LAB — LIPASE: Lipase: 48 U/L (ref 14–72)

## 2018-04-07 MED FILL — HYDROCHLOROTHIAZIDE 25 MG T: 25 | 30 days supply | Qty: 30 | Fill #0

## 2018-04-07 MED FILL — SULFAMETHOXAZOLE-TMP DS TAB: 800-160 | 3 days supply | Qty: 6 | Fill #0

## 2018-04-07 MED FILL — METOPROLOL TARTRATE 25 MG T: 25 | 30 days supply | Qty: 60 | Fill #0

## 2018-04-07 MED FILL — OMEPRAZOLE DR 40 MG CAPSULE: 40 | 30 days supply | Qty: 30 | Fill #0

## 2018-06-01 MED FILL — METOPROLOL TARTRATE 25 MG T: 25 | 30 days supply | Qty: 60 | Fill #1

## 2018-06-01 MED FILL — HYDROCHLOROTHIAZIDE 25 MG T: 25 | 30 days supply | Qty: 30 | Fill #1

## 2018-06-01 MED FILL — OMEPRAZOLE DR 40 MG CAPSULE: 40 | 30 days supply | Qty: 30 | Fill #1

## 2018-07-04 ENCOUNTER — Other Ambulatory Visit: Payer: Self-pay | Admitting: Physician Assistant

## 2018-07-04 DIAGNOSIS — I10 Essential (primary) hypertension: Secondary | ICD-10-CM

## 2018-07-05 ENCOUNTER — Other Ambulatory Visit: Payer: Self-pay | Admitting: Internal Medicine

## 2018-07-08 ENCOUNTER — Other Ambulatory Visit: Payer: Self-pay | Admitting: Internal Medicine

## 2018-11-04 ENCOUNTER — Emergency Department (HOSPITAL_COMMUNITY)
Admission: EM | Admit: 2018-11-04 | Discharge: 2018-11-04 | Disposition: A | Discharge status: Home / Self Care | Payer: Medicaid Other | Attending: Emergency Medicine | Admitting: Emergency Medicine

## 2018-11-04 ENCOUNTER — Encounter (HOSPITAL_COMMUNITY): Payer: Self-pay

## 2018-11-04 ENCOUNTER — Other Ambulatory Visit: Payer: Self-pay

## 2018-11-04 DIAGNOSIS — I10 Essential (primary) hypertension: Secondary | ICD-10-CM | POA: Insufficient documentation

## 2018-11-04 DIAGNOSIS — B850 Pediculosis due to Pediculus humanus capitis: Secondary | ICD-10-CM | POA: Insufficient documentation

## 2018-11-04 DIAGNOSIS — J45909 Unspecified asthma, uncomplicated: Secondary | ICD-10-CM | POA: Insufficient documentation

## 2018-11-04 DIAGNOSIS — F17210 Nicotine dependence, cigarettes, uncomplicated: Secondary | ICD-10-CM | POA: Diagnosis not present

## 2018-11-04 DIAGNOSIS — R21 Rash and other nonspecific skin eruption: Secondary | ICD-10-CM | POA: Diagnosis present

## 2018-11-04 DIAGNOSIS — Z79899 Other long term (current) drug therapy: Secondary | ICD-10-CM | POA: Insufficient documentation

## 2018-11-04 DIAGNOSIS — B852 Pediculosis, unspecified: Secondary | ICD-10-CM

## 2018-11-04 MED ORDER — PERMETHRIN 0.5 % AERO: INHALATION_SPRAY | Freq: Once | 0 refills | Status: AC

## 2018-11-04 NOTE — ED Triage Notes (Signed)
Patient reports that she has had a rash to the scalp x 1 month.

## 2018-11-04 NOTE — ED Provider Notes (Signed)
[facility]-EMERGENCY DEPT Provider Note   CSN: [id number] Arrival date & time: 11/04/18  1355    History   Chief Complaint Chief Complaint  Patient presents with   Rash    HPI [name] is a 38 y.o. female.     38 year old female presents with complaint of scalp itching x1 month.  Patient denies any new soaps, states she is been washing her hair more frequently due to the itching.  Patient does have children at home, states they have not gone anywhere due to COVID.  No other complaints or concerns.     Past Medical History:  Diagnosis Date   Anxiety    Asthma    Depression    doing good, not on meds now   GERD (gastroesophageal reflux disease)    Headache    migraines   History of kidney stones    Hypertension    Kidney stones    Missed abortion 10/02/2013   Ovarian cyst    Pregnancy induced hypertension    Suicidal intent    Vaginal Pap smear, abnormal    bx, f/u ok    Patient Active Problem List   Diagnosis Date Noted   MDD (major depressive disorder), recurrent severe, without psychosis (HCC) 07/23/2017   MDD (major depressive disorder), single episode, severe , no psychosis (HCC) 07/20/2017   Acute sinusitis with symptoms > 10 days 05/29/2017   Postpartum complication 06/13/2015   Normal labor and delivery 06/06/2015   Indication for care in labor or delivery 06/06/2015   S/P cesarean section 06/06/2015   Asthma, chronic 07/29/2014   Depression 07/29/2014   Smoking 07/29/2014   Essential hypertension, benign 07/29/2014   Missed abortion 10/02/2013    Past Surgical History:  Procedure Laterality Date   CESAREAN SECTION     CESAREAN SECTION N/A 06/06/2015   Procedure: CESAREAN SECTION;  Surgeon: Kathreen CosierBernard A Marshall, MD;  Location: [facility];  Service: Obstetrics;  Laterality: N/A;   CHOLECYSTECTOMY N/A 08/23/2016   Procedure: LAPAROSCOPIC CHOLECYSTECTOMY;  Surgeon: Berna Buehelsea A Connor, MD;  Location:  [facility] OR;  Service: General;  Laterality: N/A;   DILATION AND CURETTAGE OF UTERUS     DILATION AND EVACUATION N/A 10/04/2013   Procedure: DILATATION AND EVACUATION;  Surgeon: Sherian ReinJody Bovard-Stuckert, MD;  Location: [facility];  Service: Gynecology;  Laterality: N/A;  US in room    TONSILLECTOMY     TUBAL LIGATION       OB History    Gravida  4   Para  2   Term  2   Preterm  0   AB  2   Living  2     SAB  1   TAB  1   Ectopic  0   Multiple  0   Live Births  2            Home Medications    Prior to Admission medications   Medication Sig Start Date End Date Taking? Authorizing Provider  albuterol (PROVENTIL HFA;VENTOLIN HFA) 108 (90 Base) MCG/ACT inhaler Inhale 1-2 puffs into the lungs every 6 (six) hours as needed for wheezing or shortness of breath. 02/05/18   Anders SimmondsMcClung, Angela M, PA-C  ARIPiprazole (ABILIFY) 5 MG tablet Take 5 mg by mouth daily.    [provider]  atomoxetine (STRATTERA) 40 MG capsule Take 40 mg by mouth daily.    [provider]  beclomethasone (QVAR) 80 MCG/ACT inhaler Inhale 2 puffs into the lungs 2 (two) times  daily. 02/05/18   Argentina Donovan, PA-C  BIOTIN MAXIMUM PO Take 1 tablet by mouth daily.    [provider]  escitalopram (LEXAPRO) 20 MG tablet Take 20 mg by mouth daily.    [provider]  fluticasone (FLONASE) 50 MCG/ACT nasal spray Place 2 sprays into both nostrils daily. Patient not taking: Reported on 03/21/2018 12/12/16   Argentina Donovan, PA-C  guanFACINE (INTUNIV) 1 MG TB24 ER tablet Take 1 mg by mouth daily.    [provider]  hydrochlorothiazide (HYDRODIURIL) 25 MG tablet Take 1 tablet (25 mg total) by mouth daily. 04/06/18   Fulp, Cammie, MD  hydrOXYzine (ATARAX/VISTARIL) 25 MG tablet Take 1 tablet (25 mg total) by mouth 3 (three) times daily as needed for anxiety. Patient not taking: Reported on 03/21/2018 07/23/17   Money, Lowry Ram, FNP  metoprolol tartrate (LOPRESSOR) 25 MG tablet Take  1 tablet (25 mg total) by mouth 2 (two) times daily. 04/06/18   Fulp, Cammie, MD  mirtazapine (REMERON) 15 MG tablet Take 1 tablet (15 mg total) by mouth at bedtime. For mood control Patient not taking: Reported on 03/21/2018 07/23/17   Money, Lowry Ram, FNP  Multiple Vitamin (MULTIVITAMIN WITH MINERALS) TABS tablet Take 1 tablet by mouth daily.    [provider]  omeprazole (PRILOSEC) 40 MG capsule Take 1 capsule (40 mg total) by mouth daily. 04/06/18   Fulp, Cammie, MD  ondansetron (ZOFRAN ODT) 4 MG disintegrating tablet Take 1 tablet (4 mg total) by mouth every 8 (eight) hours as needed for nausea or vomiting. 03/21/18   Fawze, Mina A, PA-C  pyrethrins-piperonyl butoxide 0.5 % bottle Apply topically once for 1 dose. 11/04/18 11/04/18  Tacy Learn, PA-C  traZODone (DESYREL) 50 MG tablet Take 1 tablet (50 mg total) by mouth at bedtime as needed for sleep. Patient taking differently: Take 100 mg by mouth at bedtime as needed for sleep.  07/23/17   Money, Lowry Ram, FNP    Family History Family History  Problem Relation Age of Onset   Hearing loss Mother    Asthma Mother    Asthma Father    Diabetes Son    Cancer Maternal Grandfather        bone   Liver disease Maternal Grandfather    Asthma Sister    Lupus Sister     Social History Social History   Tobacco Use   Smoking status: Current Some Day Smoker    Packs/day: 0.50    Years: 15.00    Pack years: 7.50    Types: Cigarettes   Smokeless tobacco: Never Used   Tobacco comment: Smoking .5 ppd  Substance Use Topics   Alcohol use: Yes    Alcohol/week: 4.0 standard drinks    Types: 4 Cans of beer per week   Drug use: No     Allergies   Penicillins and Norvasc [amlodipine besylate]   Review of Systems Review of Systems  Constitutional: Negative for fever.  Skin: Positive for rash. Negative for wound.  Hematological: Negative for adenopathy.     Physical Exam Updated Vital Signs BP (!) 144/120 (BP  Location: Left Arm)    Pulse 92    Temp 97.9 F (36.6 C) (Oral)    Resp 16    Ht 5\' 4"  (1.626 m)    Wt 77.1 kg    LMP 10/21/2018    SpO2 98%    BMI 29.18 kg/m   Physical Exam Vitals signs and nursing note reviewed.  Constitutional:      General: She is not in acute distress.    Appearance: She is well-developed. She is not diaphoretic.  HENT:     Head: Normocephalic and atraumatic.   Pulmonary:     Effort: Pulmonary effort is normal.  Skin:    General: Skin is warm and dry.     Findings: Rash present.  Neurological:     Mental Status: She is alert and oriented to person, place, and time.  Psychiatric:        Behavior: Behavior normal.      ED Treatments / Results  Labs (all labs ordered are listed, but only abnormal results are displayed) Labs Reviewed - No data to display  EKG None  Radiology No results found.  Procedures Procedures (including critical care time)  Medications Ordered in ED Medications - No data to display   Initial Impression / Assessment and Plan / ED Course  I have reviewed the triage vital signs and the nursing notes.  Pertinent labs & imaging results that were available during my care of the patient were reviewed by me and considered in my medical decision making (see chart for details).  Clinical Course as of Nov 04 1442  Wed Nov 04, 2018  1443 37yo female with scalp itching x 1 month, found to have nits on exam. Recommend treatment for lice as well as treating children for lice.   [LM]    Clinical Course User Index [LM] Jeannie FendMurphy, Kaysen Sefcik A, PA-C      Final Clinical Impressions(s) / ED Diagnoses   Final diagnoses:  Lice    ED Discharge Orders         Ordered    pyrethrins-piperonyl butoxide 0.5 % bottle   Once     11/04/18 1435           Jeannie FendMurphy, Trenisha Lafavor A, PA-C 11/04/18 1444    Samuel JesterMcManus, Kathleen, DO 11/05/18 [phone]

## 2018-11-04 NOTE — ED Notes (Signed)
Bed: WTR8 Expected date:  Expected time:  Means of arrival:  Comments: 

## 2018-11-04 NOTE — Discharge Instructions (Signed)
Plan to treat all household occupants for lice. Treatment is available over the counter.

## 2018-11-25 ENCOUNTER — Other Ambulatory Visit: Payer: Self-pay | Admitting: Family Medicine

## 2018-11-25 DIAGNOSIS — I10 Essential (primary) hypertension: Secondary | ICD-10-CM

## 2018-12-10 ENCOUNTER — Ambulatory Visit: Payer: Medicaid Other | Attending: Family Medicine | Admitting: Physician Assistant

## 2018-12-10 ENCOUNTER — Other Ambulatory Visit: Payer: Self-pay

## 2018-12-10 DIAGNOSIS — I10 Essential (primary) hypertension: Secondary | ICD-10-CM

## 2018-12-10 DIAGNOSIS — F17200 Nicotine dependence, unspecified, uncomplicated: Secondary | ICD-10-CM | POA: Diagnosis not present

## 2018-12-10 MED ORDER — NICOTINE 14 MG/24HR TD PT24: 14.0000 mg | MEDICATED_PATCH | Freq: Every day | TRANSDERMAL | 0 refills | Status: DC

## 2018-12-10 MED ORDER — METOPROLOL TARTRATE 25 MG PO TABS: 25.0000 mg | ORAL_TABLET | Freq: Two times a day (BID) | ORAL | 1 refills | Status: DC

## 2018-12-10 MED ORDER — HYDROCHLOROTHIAZIDE 25 MG PO TABS: 25.0000 mg | ORAL_TABLET | Freq: Every day | ORAL | 1 refills | Status: DC

## 2018-12-10 NOTE — Progress Notes (Signed)
Patient ID: [name], female   DOB: 1981/03/26, 38 y.o.   MRN: [id number] Virtual Visit via Telephone Note  I connected with [name] on 12/10/18 at 10:10 AM EDT by telephone and verified that I am speaking with the correct person using two identifiers.   I discussed the limitations, risks, security and privacy concerns of performing an evaluation and management service by telephone and the availability of in person appointments. I also discussed with the patient that there may be a patient responsible charge related to this service. The patient expressed understanding and agreed to proceed.  Patient location:  home My Location:  [place] office Persons on the call:  Me and the patient   History of Present Illness:  Needs RF of BP meds.  She is completely out for 2 days.  Denies HA/CP/dizziness/sob.  Also would like nicotine patches to stop smoking.  Observations/Objective:  A&Ox3   Assessment and Plan: 1. Essential hypertension, benign - hydrochlorothiazide (HYDRODIURIL) 25 MG tablet; Take 1 tablet (25 mg total) by mouth daily.  Dispense: 90 tablet; Refill: 1 - metoprolol tartrate (LOPRESSOR) 25 MG tablet; Take 1 tablet (25 mg total) by mouth 2 (two) times daily.  Dispense: 180 tablet; Refill: 1  2. Smoking -cessation counseling given - nicotine (NICODERM CQ - DOSED IN MG/24 HOURS) 14 mg/24hr patch; Place 1 patch (14 mg total) onto the skin daily.  Dispense: 28 patch; Refill: 0    Follow Up Instructions: 6 weeks to see PCP and get UTD on bloodwork   I discussed the assessment and treatment plan with the patient. The patient was provided an opportunity to ask questions and all were answered. The patient agreed with the plan and demonstrated an understanding of the instructions.   The patient was advised to call back or seek an in-person evaluation if the symptoms worsen or if the condition fails to improve as anticipated.  I provided 12 minutes of non-face-to-face  time during this encounter.   Freeman Caldron, PA-C

## 2018-12-11 ENCOUNTER — Telehealth: Payer: Self-pay | Admitting: Family Medicine

## 2018-12-11 NOTE — Telephone Encounter (Signed)
Please schedule pt a 6 week f/u with Dr. Chapman Fitch   Attempted to reach patient no answer lvm

## 2019-02-02 ENCOUNTER — Emergency Department (HOSPITAL_COMMUNITY)
Admission: EM | Admit: 2019-02-02 | Discharge: 2019-02-02 | Discharge status: Against Medical Advice | Payer: Medicaid Other

## 2019-02-02 ENCOUNTER — Other Ambulatory Visit: Payer: Self-pay

## 2019-02-02 NOTE — ED Notes (Signed)
Patient informed registration that she was leaving.  

## 2019-02-03 ENCOUNTER — Inpatient Hospital Stay: Admission: RE | Admit: 2019-02-03 | Payer: Medicaid Other | Source: Ambulatory Visit

## 2019-02-03 ENCOUNTER — Other Ambulatory Visit: Payer: Self-pay

## 2019-02-03 ENCOUNTER — Ambulatory Visit (INDEPENDENT_AMBULATORY_CARE_PROVIDER_SITE_OTHER)
Admission: RE | Admit: 2019-02-03 | Discharge: 2019-02-03 | Disposition: A | Discharge status: Home / Self Care | Payer: Self-pay | Source: Ambulatory Visit

## 2019-02-03 DIAGNOSIS — J45909 Unspecified asthma, uncomplicated: Secondary | ICD-10-CM

## 2019-02-03 DIAGNOSIS — J40 Bronchitis, not specified as acute or chronic: Secondary | ICD-10-CM

## 2019-02-03 MED ORDER — PREDNISONE 20 MG PO TABS: 40.0000 mg | ORAL_TABLET | Freq: Every day | ORAL | 0 refills | Status: AC

## 2019-02-03 MED ORDER — QVAR 80 MCG/ACT IN AERS: 2.0000 | INHALATION_SPRAY | Freq: Two times a day (BID) | RESPIRATORY_TRACT | 0 refills | Status: DC

## 2019-02-03 NOTE — ED Provider Notes (Signed)
Virtual Visit via Video Note:  [name]  initiated request for Telemedicine visit with [facility] team. I connected with [name]  on 02/03/2019 at 6:27 PM  for a synchronized telemedicine visit using a video enabled HIPPA compliant telemedicine application. I verified that I am speaking with [name]  using two identifiers. Melissa Gottron, NP  was physically located in a [facility] Urgent care site and [name] was located at a different location.   The limitations of evaluation and management by telemedicine as well as the availability of in-person appointments were discussed. Patient was informed that she  may incur a bill ( including co-pay) for this virtual visit encounter. [name]  expressed understanding and gave verbal consent to proceed with virtual visit.     History of Present Illness:[name]  is a 38 y.o. female presents with complaints of symptoms which started 8/29. Went to [facility] and was diagnosed with URI and recommended OTC treatment for symptoms. Symptoms have not improved. Cough and congestion. Cough is productive. Sometimes shortness of breath , uses inhaler which does help. History of asthma. No fevers. Some nasal drainage. No ear pain. Sore throat which has improved. Chest is sore from coughing, but otherwise no chest pain. Has been trying dayquil, nyquil, Dm cough syrup, mucinex, as well as delsym. These have not helped. History  Of bronchitis, typically steroids does help. Does not follow with a PCP. No leg pain or swelling. Went to [place] last week, otherwise no travel. Wheezing initially. Feels wheezing at night. Has been using inhaler more than normal. Not on birth control. Smokes, 1/5 ppd. Now smoking less due to her cough. Has not been using qvar in a few months. History  Of anxiety, asthma, depression, gerd, migraines, htn.   Past Medical History:  Diagnosis Date  . Anxiety   . Asthma   .  Depression    doing good, not on meds now  . GERD (gastroesophageal reflux disease)   . Headache    migraines  . History of kidney stones   . Hypertension   . Kidney stones   . Missed abortion 10/02/2013  . Ovarian cyst   . Pregnancy induced hypertension   . Suicidal intent   . Vaginal Pap smear, abnormal    bx, f/u ok    Allergies  Allergen Reactions  . Penicillins Nausea And Vomiting and Other (See Comments)    UNSPECIFIED REACTION  Has patient had a PCN reaction causing immediate rash, facial/tongue/throat swelling, SOB or lightheadedness with hypotension:No Has patient had a PCN reaction causing severe rash involving mucus membranes or skin necrosis:No Has patient had a PCN reaction that REACTION THAT REQUIRED HOSPITALIZATION:  #  #  #  YES  #  #  #  Has patient had a PCN reaction occurring within the last 10 years: #  #  #  YES  #  #  #   . Norvasc [Amlodipine Besylate] Other (See Comments)    Hypersensitivity? Tip of tongue numb and flushing.        Observations/Objective: Alert, oriented, non toxic in appearance. Clear coherent speech without difficulty. No increased work of breathing visualized. Occasional dry cough noted.   Assessment and Plan: Restarted qvar, prednisone provided for likely bronchitis after recent URI. Continue with supportive cares. To seek in person assessment for any persistent or worsening symptoms. Patient verbalized understanding and agreeable to plan.    Follow Up  Instructions:    I discussed the assessment and treatment plan with the patient. The patient was provided an opportunity to ask questions and all were answered. The patient agreed with the plan and demonstrated an understanding of the instructions.   The patient was advised to call back or seek an in-person evaluation if the symptoms worsen or if the condition fails to improve as anticipated.  I provided 15 minutes of non-face-to-face time during this encounter.    Georgetta HaberNatalie B  Sloka Volante, NP  02/03/2019 6:27 PM         Georgetta HaberBurky, Khaalid Lefkowitz B, NP 02/03/19 1940

## 2019-02-03 NOTE — Discharge Instructions (Signed)
Sorry we got cut off! I have refilled your qvar to restart daily.  5 days of prednisone to help with your cough and shortness of breath .  You may continue with the over the counter treatments as needed for symptoms.  Your albuterol as needed for wheezing.  If no improvement or if any worsening of symptoms please be seen in person at one of our urgent care locations or with your primary care provider.

## 2019-02-24 ENCOUNTER — Encounter (HOSPITAL_COMMUNITY): Payer: Self-pay

## 2019-02-24 ENCOUNTER — Ambulatory Visit (HOSPITAL_COMMUNITY)
Admission: EM | Admit: 2019-02-24 | Discharge: 2019-02-24 | Disposition: A | Discharge status: Home / Self Care | Payer: Medicaid Other | Attending: Emergency Medicine | Admitting: Emergency Medicine

## 2019-02-24 ENCOUNTER — Other Ambulatory Visit: Payer: Self-pay

## 2019-02-24 DIAGNOSIS — I10 Essential (primary) hypertension: Secondary | ICD-10-CM | POA: Diagnosis not present

## 2019-02-24 DIAGNOSIS — J45901 Unspecified asthma with (acute) exacerbation: Secondary | ICD-10-CM | POA: Diagnosis not present

## 2019-02-24 MED ORDER — AZITHROMYCIN 250 MG PO TABS: 250.0000 mg | ORAL_TABLET | Freq: Every day | ORAL | 0 refills | Status: DC

## 2019-02-24 MED ORDER — ALBUTEROL SULFATE HFA 108 (90 BASE) MCG/ACT IN AERS
2.0000 | INHALATION_SPRAY | Freq: Once | RESPIRATORY_TRACT | Status: AC
  Administered 2019-02-24: 2 via RESPIRATORY_TRACT

## 2019-02-24 MED ORDER — ALBUTEROL SULFATE HFA 108 (90 BASE) MCG/ACT IN AERS
INHALATION_SPRAY | RESPIRATORY_TRACT | Status: AC
  Filled 2019-02-24: qty 6.7

## 2019-02-24 MED ORDER — PREDNISONE 10 MG PO TABS: 40.0000 mg | ORAL_TABLET | Freq: Every day | ORAL | 0 refills | Status: AC

## 2019-02-24 NOTE — Discharge Instructions (Addendum)
Take the antibiotic and prednisone as directed.  Use the albuterol inhaler 2 puffs every 4 hours as needed for cough and wheezing.    Follow up with your primary care provider if your symptoms are not improving.  Go to the emergency department if you have shortness of breath or difficulty breathing or worsening symptoms.    Your blood pressure is elevated today at 147/108.  Please have this rechecked by your primary care provider in 2 weeks.

## 2019-02-24 NOTE — ED Provider Notes (Signed)
[facility]    CSN: [id number] Arrival date & time: 02/24/19  1825      History   Chief Complaint Chief Complaint  Patient presents with  . Cough    HPI [name] is a 38 y.o. female.   Patient presents with 8610-month history of nonproductive cough and wheezing.  Patient has Asthma.  She had a video visit on 02/03/2019; she was prescribed prednisone and a refill of her Qvar; she was unable to afford the Qvar but did take the prednisone.  She has been using her son's albuterol inhaler.  She denies fever, chills, shortness of breath, or other symptoms.  The history is provided by the patient.    Past Medical History:  Diagnosis Date  . Anxiety   . Asthma   . Depression    doing good, not on meds now  . GERD (gastroesophageal reflux disease)   . Headache    migraines  . History of kidney stones   . Hypertension   . Kidney stones   . Missed abortion 10/02/2013  . Ovarian cyst   . Pregnancy induced hypertension   . Suicidal intent   . Vaginal Pap smear, abnormal    bx, f/u ok    Patient Active Problem List   Diagnosis Date Noted  . 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 MDD (major depressive disorder), single episode, severe , no psychosis (HCC) 07/20/2017  . Acute sinusitis with symptoms > 10 days 05/29/2017  . Postpartum complication 06/13/2015  . Normal labor and delivery 06/06/2015  . Indication for care in labor or delivery 06/06/2015  . S/P cesarean section 06/06/2015  . Asthma, chronic 07/29/2014  . Depression 07/29/2014  . Smoking 07/29/2014  . Essential hypertension, benign 07/29/2014  . Missed abortion 10/02/2013    Past Surgical History:  Procedure Laterality Date  . CESAREAN SECTION    . CESAREAN SECTION N/A 06/06/2015   Procedure: CESAREAN SECTION;  Surgeon: Kathreen CosierBernard A Marshall, MD;  Location: [facility];  Service: Obstetrics;  Laterality: N/A;  . CHOLECYSTECTOMY N/A 08/23/2016   Procedure: LAPAROSCOPIC CHOLECYSTECTOMY;  Surgeon: Berna Buehelsea A Connor, MD;  Location: [facility];  Service: General;  Laterality: N/A;  . DILATION AND CURETTAGE OF UTERUS    . DILATION AND EVACUATION N/A 10/04/2013   Procedure: DILATATION AND EVACUATION;  Surgeon: Sherian ReinJody Bovard-Stuckert, MD;  Location: [facility];  Service: Gynecology;  Laterality: N/A;  US in room   . TONSILLECTOMY    . TUBAL LIGATION      OB History    Gravida  4   Para  2   Term  2   Preterm  0   AB  2   Living  2     SAB  1   TAB  1   Ectopic  0   Multiple  0   Live Births  2            Home Medications    Prior to Admission medications   Medication Sig Start Date End Date Taking? Authorizing Provider  albuterol (PROVENTIL HFA;VENTOLIN HFA) 108 (90 Base) MCG/ACT inhaler Inhale 1-2 puffs into the lungs every 6 (six) hours as needed for wheezing or shortness of breath. 02/05/18   Anders SimmondsMcClung, Angela M, PA-C  ARIPiprazole (ABILIFY) 5 MG tablet Take 5 mg by mouth daily.    [provider]  atomoxetine (STRATTERA) 40 MG capsule Take 40 mg by mouth daily.    [provider]  azithromycin (ZITHROMAX) 250 MG tablet Take 1 tablet (250 mg total) by mouth daily. Take first 2 tablets together, then 1 every day until finished. 02/24/19   Sharion Balloon, NP  beclomethasone (QVAR) 80 MCG/ACT inhaler Inhale 2 puffs into the lungs 2 (two) times daily. 02/03/19   Melissa Gottron, NP  BIOTIN MAXIMUM PO Take 1 tablet by mouth daily.    [provider]  escitalopram (LEXAPRO) 20 MG tablet Take 20 mg by mouth daily.    [provider]  fluticasone (FLONASE) 50 MCG/ACT nasal spray Place 2 sprays into both nostrils daily. Patient not taking: Reported on 03/21/2018 12/12/16   Argentina Donovan, PA-C  guanFACINE (INTUNIV) 1 MG TB24 ER tablet Take 1 mg by mouth daily.    [provider]  hydrochlorothiazide (HYDRODIURIL) 25 MG tablet Take 1 tablet (25 mg total) by mouth daily. 12/10/18   Argentina Donovan, PA-C  hydrOXYzine (ATARAX/VISTARIL) 25 MG tablet Take 1 tablet (25 mg total) by mouth 3 (three) times daily as needed for anxiety. Patient not taking: Reported on 03/21/2018 07/23/17   Money, Lowry Ram, FNP  metoprolol tartrate (LOPRESSOR) 25 MG tablet Take 1 tablet (25 mg total) by mouth 2 (two) times daily. 12/10/18   Argentina Donovan, PA-C  mirtazapine (REMERON) 15 MG tablet Take 1 tablet (15 mg total) by mouth at bedtime. For mood control Patient not taking: Reported on 03/21/2018 07/23/17   Money, Lowry Ram, FNP  Multiple Vitamin (MULTIVITAMIN WITH MINERALS) TABS tablet Take 1 tablet by mouth daily.    [provider]  nicotine (NICODERM CQ - DOSED IN MG/24 HOURS) 14 mg/24hr patch Place 1 patch (14 mg total) onto the skin daily. 12/10/18   Argentina Donovan, PA-C  omeprazole (PRILOSEC) 40 MG capsule Take 1 capsule (40 mg total) by mouth daily. 04/06/18   Fulp, Cammie, MD  ondansetron (ZOFRAN ODT) 4 MG disintegrating tablet Take 1 tablet (4 mg total) by mouth every 8 (eight) hours as needed for nausea or vomiting. 03/21/18   Fawze, Mina A, PA-C  predniSONE (DELTASONE) 10 MG tablet Take 4 tablets (40 mg total) by mouth daily for 5 days. 02/24/19 03/01/19  Sharion Balloon, NP  traZODone (DESYREL) 50 MG tablet Take 1 tablet (50 mg total) by mouth at bedtime as needed for sleep. Patient taking differently: Take 100 mg by mouth at bedtime as needed for sleep.  07/23/17   Money, Lowry Ram, FNP    Family History Family History  Problem Relation Age of Onset  . Hearing loss Mother   . Asthma Mother   . Asthma Father   . Diabetes Son   . Cancer Maternal Grandfather        bone  . Liver disease Maternal Grandfather   . Asthma Sister   . Lupus Sister     Social History Social History   Tobacco Use  . Smoking status: Current Some Day Smoker    Packs/day: 0.50    Years: 15.00    Pack years: 7.50    Types: Cigarettes  . Smokeless tobacco: Never Used  . Tobacco comment: Smoking .5 ppd   Substance Use Topics  . Alcohol use: Yes    Alcohol/week: 4.0 standard drinks    Types: 4 Cans of beer per week  . Drug use: No     Allergies   Penicillins and Norvasc [amlodipine besylate]   Review of Systems Review of Systems  Constitutional: Negative for chills and fever.  HENT:  Negative for ear pain and sore throat.   Eyes: Negative for pain and visual disturbance.  Respiratory: Positive for cough and wheezing. Negative for shortness of breath.   Cardiovascular: Negative for chest pain and palpitations.  Gastrointestinal: Negative for abdominal pain and vomiting.  Genitourinary: Negative for dysuria and hematuria.  Musculoskeletal: Negative for arthralgias and back pain.  Skin: Negative for color change and rash.  Neurological: Negative for seizures and syncope.  All other systems reviewed and are negative.    Physical Exam Triage Vital Signs ED Triage Vitals  Enc Vitals Group     BP 02/24/19 1910 (!) 147/108     Pulse Rate 02/24/19 1910 88     Resp 02/24/19 1910 16     Temp 02/24/19 1910 98.5 F (36.9 C)     Temp Source 02/24/19 1910 Oral     SpO2 02/24/19 1910 92 %     Weight --      Height --      Head Circumference --      Peak Flow --      Pain Score 02/24/19 1915 1     Pain Loc --      Pain Edu? --      Excl. in GC? --    No data found.  Updated Vital Signs BP (!) 147/108 (BP Location: Right Arm)   Pulse 88   Temp 98.5 F (36.9 C) (Oral)   Resp 16   LMP 02/10/2019   SpO2 95%   Visual Acuity Right Eye Distance:   Left Eye Distance:   Bilateral Distance:    Right Eye Near:   Left Eye Near:    Bilateral Near:     Physical Exam Vitals signs and nursing note reviewed.  Constitutional:      General: She is not in acute distress.    Appearance: She is well-developed. She is not ill-appearing.  HENT:     Head: Normocephalic and atraumatic.     Right Ear: Tympanic membrane normal.     Left Ear: Tympanic membrane normal.     Nose: Nose  normal.     Mouth/Throat:     Mouth: Mucous membranes are moist.     Pharynx: Oropharynx is clear.  Eyes:     Conjunctiva/sclera: Conjunctivae normal.  Neck:     Musculoskeletal: Neck supple.  Cardiovascular:     Rate and Rhythm: Normal rate and regular rhythm.     Heart sounds: No murmur.  Pulmonary:     Effort: Pulmonary effort is normal. No respiratory distress.     Breath sounds: Wheezing present.     Comments: No respiratory distress. Abdominal:     Palpations: Abdomen is soft.     Tenderness: There is no abdominal tenderness. There is no guarding or rebound.  Skin:    General: Skin is warm and dry.     Findings: No rash.  Neurological:     General: No focal deficit present.     Mental Status: She is alert and oriented to person, place, and time.      UC Treatments / Results  Labs (all labs ordered are listed, but only abnormal results are displayed) Labs Reviewed - No data to display  EKG   Radiology No results found.  Procedures Procedures (including critical care time)  Medications Ordered in UC Medications  albuterol (VENTOLIN HFA) 108 (90 Base) MCG/ACT inhaler 2 puff (2 puffs Inhalation Given 02/24/19 1932)  albuterol (VENTOLIN HFA) 108 (90 Base) MCG/ACT inhaler (  has no administration in time range)    Initial Impression / Assessment and Plan / UC Course  I have reviewed the triage vital signs and the nursing notes.  Pertinent labs & imaging results that were available during my care of the patient were reviewed by me and considered in my medical decision making (see chart for details).    Asthma exacerbation.  Elevated blood pressure with known hypertension.  Patient refuses COVID test.  Treating with albuterol inhaler, prednisone, Zithromax.  Instructed patient to follow-up with her PCP if her symptoms are not improving and to go to the emergency department if she has worsening symptoms, shortness of breath, difficulty breathing, or other concerns.   Discussed with patient that her blood pressure is elevated and she needs to have this rechecked by her PCP in 2 weeks.  Patient agrees to plan of care.     Final Clinical Impressions(s) / UC Diagnoses   Final diagnoses:  Elevated blood pressure reading in office with diagnosis of hypertension  Exacerbation of asthma, unspecified asthma severity, unspecified whether persistent     Discharge Instructions     Take the antibiotic and prednisone as directed.  Use the albuterol inhaler 2 puffs every 4 hours as needed for cough and wheezing.    Follow up with your primary care provider if your symptoms are not improving.  Go to the emergency department if you have shortness of breath or difficulty breathing or worsening symptoms.    Your blood pressure is elevated today at 147/108.  Please have this rechecked by your primary care provider in 2 weeks.        ED Prescriptions    Medication Sig Dispense Auth. Provider   predniSONE (DELTASONE) 10 MG tablet Take 4 tablets (40 mg total) by mouth daily for 5 days. 20 tablet Mickie Bail, NP   azithromycin (ZITHROMAX) 250 MG tablet Take 1 tablet (250 mg total) by mouth daily. Take first 2 tablets together, then 1 every day until finished. 6 tablet Mickie Bail, NP     PDMP not reviewed this encounter.   Mickie Bail, NP 02/24/19 [phone]

## 2019-02-24 NOTE — ED Triage Notes (Signed)
Pt presents to UC stating she has had a cough since August 2020. She states she's been to the ER and has had a video visit and dx her w/ bronchitis but was not prescribed any medications. Pt has not been able to fill her current medications at this time. Pt c/o chest tightness.

## 2019-03-15 ENCOUNTER — Other Ambulatory Visit: Payer: Self-pay

## 2019-03-15 ENCOUNTER — Emergency Department (HOSPITAL_COMMUNITY)
Admission: EM | Admit: 2019-03-15 | Discharge: 2019-03-15 | Disposition: A | Discharge status: Home / Self Care | Payer: Medicaid Other | Attending: Emergency Medicine | Admitting: Emergency Medicine

## 2019-03-15 ENCOUNTER — Encounter (HOSPITAL_COMMUNITY): Payer: Self-pay | Admitting: Emergency Medicine

## 2019-03-15 ENCOUNTER — Emergency Department (HOSPITAL_COMMUNITY): Payer: Medicaid Other

## 2019-03-15 DIAGNOSIS — Z79899 Other long term (current) drug therapy: Secondary | ICD-10-CM | POA: Diagnosis not present

## 2019-03-15 DIAGNOSIS — Z20828 Contact with and (suspected) exposure to other viral communicable diseases: Secondary | ICD-10-CM | POA: Diagnosis not present

## 2019-03-15 DIAGNOSIS — J45901 Unspecified asthma with (acute) exacerbation: Secondary | ICD-10-CM | POA: Diagnosis not present

## 2019-03-15 DIAGNOSIS — I10 Essential (primary) hypertension: Secondary | ICD-10-CM | POA: Insufficient documentation

## 2019-03-15 DIAGNOSIS — R05 Cough: Secondary | ICD-10-CM | POA: Diagnosis present

## 2019-03-15 DIAGNOSIS — F17210 Nicotine dependence, cigarettes, uncomplicated: Secondary | ICD-10-CM | POA: Insufficient documentation

## 2019-03-15 LAB — CBC WITH DIFFERENTIAL/PLATELET
Abs Immature Granulocytes: 0.05 10*3/uL (ref 0.00–0.07)
Basophils Absolute: 0.1 10*3/uL (ref 0.0–0.1)
Basophils Relative: 1 %
Eosinophils Absolute: 0.1 10*3/uL (ref 0.0–0.5)
Eosinophils Relative: 1 %
HCT: 47.4 % — ABNORMAL HIGH (ref 36.0–46.0)
Hemoglobin: 15.2 g/dL — ABNORMAL HIGH (ref 12.0–15.0)
Immature Granulocytes: 1 %
Lymphocytes Relative: 17 %
Lymphs Abs: 1.6 10*3/uL (ref 0.7–4.0)
MCH: 29.4 pg (ref 26.0–34.0)
MCHC: 32.1 g/dL (ref 30.0–36.0)
MCV: 91.7 fL (ref 80.0–100.0)
Monocytes Absolute: 0.8 10*3/uL (ref 0.1–1.0)
Monocytes Relative: 9 %
Neutro Abs: 6.7 10*3/uL (ref 1.7–7.7)
Neutrophils Relative %: 71 %
Platelets: 299 10*3/uL (ref 150–400)
RBC: 5.17 MIL/uL — ABNORMAL HIGH (ref 3.87–5.11)
RDW: 14.1 % (ref 11.5–15.5)
WBC: 9.4 10*3/uL (ref 4.0–10.5)
nRBC: 0 % (ref 0.0–0.2)

## 2019-03-15 LAB — COMPREHENSIVE METABOLIC PANEL
ALT: 16 U/L (ref 0–44)
AST: 21 U/L (ref 15–41)
Albumin: 3.9 g/dL (ref 3.5–5.0)
Alkaline Phosphatase: 86 U/L (ref 38–126)
Anion gap: 10 (ref 5–15)
BUN: 6 mg/dL (ref 6–20)
CO2: 29 mmol/L (ref 22–32)
Calcium: 9.5 mg/dL (ref 8.9–10.3)
Chloride: 98 mmol/L (ref 98–111)
Creatinine, Ser: 0.88 mg/dL (ref 0.44–1.00)
GFR calc Af Amer: >60 mL/min (ref >60)
GFR calc non Af Amer: >60 mL/min (ref >60)
Glucose, Bld: 92 mg/dL (ref 70–99)
Potassium: 3.4 mmol/L — ABNORMAL LOW (ref 3.5–5.1)
Sodium: 137 mmol/L (ref 135–145)
Total Bilirubin: 0.5 mg/dL (ref 0.3–1.2)
Total Protein: 7.1 g/dL (ref 6.5–8.1)

## 2019-03-15 LAB — I-STAT BETA HCG BLOOD, ED (MC, WL, AP ONLY): I-stat hCG, quantitative: <5 m[IU]/mL (ref <5)

## 2019-03-15 LAB — D-DIMER, QUANTITATIVE: D-Dimer, Quant: 0.37 ug/mL-FEU (ref 0.00–0.50)

## 2019-03-15 MED ORDER — AEROCHAMBER PLUS FLO-VU MEDIUM MISC
1.0000 | Freq: Once | Status: AC
  Administered 2019-03-15: 1
  Filled 2019-03-15: qty 1

## 2019-03-15 MED ORDER — PREDNISONE 10 MG (21) PO TBPK: ORAL_TABLET | Freq: Every day | ORAL | 0 refills | Status: DC

## 2019-03-15 MED ORDER — BENZONATATE 100 MG PO CAPS: 100.0000 mg | ORAL_CAPSULE | Freq: Three times a day (TID) | ORAL | 0 refills | Status: DC | PRN

## 2019-03-15 MED ORDER — IPRATROPIUM BROMIDE HFA 17 MCG/ACT IN AERS
2.0000 | INHALATION_SPRAY | Freq: Once | RESPIRATORY_TRACT | Status: AC
  Administered 2019-03-15: 2 via RESPIRATORY_TRACT
  Filled 2019-03-15: qty 12.9

## 2019-03-15 MED ORDER — ALBUTEROL SULFATE HFA 108 (90 BASE) MCG/ACT IN AERS
8.0000 | INHALATION_SPRAY | Freq: Once | RESPIRATORY_TRACT | Status: AC
  Administered 2019-03-15: 8 via RESPIRATORY_TRACT
  Filled 2019-03-15: qty 6.7

## 2019-03-15 MED ORDER — METHYLPREDNISOLONE SODIUM SUCC 125 MG IJ SOLR
125.0000 mg | Freq: Once | INTRAMUSCULAR | Status: AC
  Administered 2019-03-15: 125 mg via INTRAVENOUS
  Filled 2019-03-15: qty 2

## 2019-03-15 MED ORDER — ACETAMINOPHEN 325 MG PO TABS
650.0000 mg | ORAL_TABLET | Freq: Once | ORAL | Status: AC
  Administered 2019-03-15: 650 mg via ORAL
  Filled 2019-03-15: qty 2

## 2019-03-15 MED ORDER — SODIUM CHLORIDE 0.9 % IV BOLUS
1000.0000 mL | Freq: Once | INTRAVENOUS | Status: AC
  Administered 2019-03-15: 1000 mL via INTRAVENOUS

## 2019-03-15 NOTE — Discharge Instructions (Addendum)
You were seen in the emergency department today for cough and trouble breathing. Your chest x-ray was normal.  Your labs were reassuring.  Your potassium was a bit low, please be sure to include potassium rich foods in your diet.  You tested for COVID-19, we will call you if results are positive, if positive is important that you quarantine for 14 days.   We are sending home with Tessalon to take every 8 hours as needed for coughing as well as a prednisone taper for asthma exacerbation.  Please use an albuterol inhaler consistently 2 puffs every 4 hours for the next 48 hours then use as needed for trouble breathing.  Please continue your maintenance inhalers at home.  We have prescribed you new medication(s) today. Discuss the medications prescribed today with your pharmacist as they can have adverse effects and interactions with your other medicines including over the counter and prescribed medications. Seek medical evaluation if you start to experience new or abnormal symptoms after taking one of these medicines, seek care immediately if you start to experience difficulty breathing, feeling of your throat closing, facial swelling, or rash as these could be indications of a more serious allergic reaction  Please follow-up with primary care and/or pulmonology within 3 to 5 days.  Return to the ER for new or worsening symptoms including but not limited to worsening trouble breathing, persistent wheezing, fever, or any other concerns.

## 2019-03-15 NOTE — ED Notes (Signed)
Patient transported to X-ray 

## 2019-03-15 NOTE — ED Triage Notes (Signed)
Ongoing productive cough for 3 weeks; "coughing so much my head hurts."

## 2019-03-15 NOTE — ED Provider Notes (Signed)
[facility]-EMERGENCY DEPT Provider Note   CSN: [id number] Arrival date & time: 03/15/19  1333     History   Chief Complaint Chief Complaint  Patient presents with  . Cough    HPI [name] is a 38 y.o. female with a history of asthma, anxiety, depression, GERD, and prior tubal ligation who presents to the emergency department with complaints of persistent cough x 2 months.  Patient states she has had a progressively worsening intermittently productive cough with clear phlegm sputum with associated wheezing and intermittent shortness of breath (with coughing spells/exertion).  States that her chest hurts when she coughs, but otherwise not having any chest pain.  No alleviating or aggravating factors to her symptoms other than what is mentioned above.  She has been seen for her symptoms previously, she has been treated for an asthma exacerbation twice with courses of prednisone, most recently was seen in urgent care 02/24/2019 and was treated with an albuterol inhaler, prednisone, and a Z-Pak.  She states that she completed these medicines without relief.  She is not having much relief with her inhalers.  She denies fever, chills, nausea, vomiting, diaphoresis, abdominal pain, leg pain/swelling, hemoptysis, recent surgery/trauma, recent long travel, hormone use, personal hx of cancer, or hx of DVT/PE.  Denies illicit drug use.    HPI  Past Medical History:  Diagnosis Date  . Anxiety   . Asthma   . Depression    doing good, not on meds now  . GERD (gastroesophageal reflux disease)   . Headache    migraines  . History of kidney stones   . Hypertension   . Kidney stones   . Missed abortion 10/02/2013  . Ovarian cyst   . Pregnancy induced hypertension   . Suicidal intent   . Vaginal Pap smear, abnormal    bx, f/u ok    Patient Active Problem List   Diagnosis Date Noted  . MDD (major depressive disorder), recurrent severe, without psychosis (HCC)  07/23/2017  . MDD (major depressive disorder), single episode, severe , no psychosis (HCC) 07/20/2017  . Acute sinusitis with symptoms > 10 days 05/29/2017  . Postpartum complication 06/13/2015  . Normal labor and delivery 06/06/2015  . Indication for care in labor or delivery 06/06/2015  . S/P cesarean section 06/06/2015  . Asthma, chronic 07/29/2014  . Depression 07/29/2014  . Smoking 07/29/2014  . Essential hypertension, benign 07/29/2014  . Missed abortion 10/02/2013    Past Surgical History:  Procedure Laterality Date  . CESAREAN SECTION    . CESAREAN SECTION N/A 06/06/2015   Procedure: CESAREAN SECTION;  Surgeon: Kathreen Cosier, MD;  Location: [facility];  Service: Obstetrics;  Laterality: N/A;  . CHOLECYSTECTOMY N/A 08/23/2016   Procedure: LAPAROSCOPIC CHOLECYSTECTOMY;  Surgeon: Berna Bue, MD;  Location: [facility] OR;  Service: General;  Laterality: N/A;  . DILATION AND CURETTAGE OF UTERUS    . DILATION AND EVACUATION N/A 10/04/2013   Procedure: DILATATION AND EVACUATION;  Surgeon: Sherian Rein, MD;  Location: [facility] ORS;  Service: Gynecology;  Laterality: N/A;  [place] in room   . TONSILLECTOMY    . TUBAL LIGATION       OB History    Gravida  4   Para  2   Term  2   Preterm  0   AB  2   Living  2     SAB  1   TAB  1   Ectopic  0   Multiple  0   Live Births  2            Home Medications    Prior to Admission medications   Medication Sig Start Date End Date Taking? Authorizing Provider  albuterol (PROVENTIL HFA;VENTOLIN HFA) 108 (90 Base) MCG/ACT inhaler Inhale 1-2 puffs into the lungs every 6 (six) hours as needed for wheezing or shortness of breath. 02/05/18   Anders Simmonds, PA-C  ARIPiprazole (ABILIFY) 5 MG tablet Take 5 mg by mouth daily.    [provider]  atomoxetine (STRATTERA) 40 MG capsule Take 40 mg by mouth daily.    [provider]  azithromycin (ZITHROMAX) 250 MG tablet Take 1 tablet (250 mg total) by mouth daily.  Take first 2 tablets together, then 1 every day until finished. 02/24/19   Mickie Bail, NP  beclomethasone (QVAR) 80 MCG/ACT inhaler Inhale 2 puffs into the lungs 2 (two) times daily. 02/03/19   Georgetta Haber, NP  BIOTIN MAXIMUM PO Take 1 tablet by mouth daily.    [provider]  escitalopram (LEXAPRO) 20 MG tablet Take 20 mg by mouth daily.    [provider]  fluticasone (FLONASE) 50 MCG/ACT nasal spray Place 2 sprays into both nostrils daily. Patient not taking: Reported on 03/21/2018 12/12/16   Anders Simmonds, PA-C  guanFACINE (INTUNIV) 1 MG TB24 ER tablet Take 1 mg by mouth daily.    [provider]  hydrochlorothiazide (HYDRODIURIL) 25 MG tablet Take 1 tablet (25 mg total) by mouth daily. 12/10/18   Anders Simmonds, PA-C  hydrOXYzine (ATARAX/VISTARIL) 25 MG tablet Take 1 tablet (25 mg total) by mouth 3 (three) times daily as needed for anxiety. Patient not taking: Reported on 03/21/2018 07/23/17   Money, Gerlene Burdock, FNP  metoprolol tartrate (LOPRESSOR) 25 MG tablet Take 1 tablet (25 mg total) by mouth 2 (two) times daily. 12/10/18   Anders Simmonds, PA-C  mirtazapine (REMERON) 15 MG tablet Take 1 tablet (15 mg total) by mouth at bedtime. For mood control Patient not taking: Reported on 03/21/2018 07/23/17   Money, Gerlene Burdock, FNP  Multiple Vitamin (MULTIVITAMIN WITH MINERALS) TABS tablet Take 1 tablet by mouth daily.    [provider]  nicotine (NICODERM CQ - DOSED IN MG/24 HOURS) 14 mg/24hr patch Place 1 patch (14 mg total) onto the skin daily. 12/10/18   Anders Simmonds, PA-C  omeprazole (PRILOSEC) 40 MG capsule Take 1 capsule (40 mg total) by mouth daily. 04/06/18   Fulp, Cammie, MD  ondansetron (ZOFRAN ODT) 4 MG disintegrating tablet Take 1 tablet (4 mg total) by mouth every 8 (eight) hours as needed for nausea or vomiting. 03/21/18   Fawze, Mina A, PA-C  traZODone (DESYREL) 50 MG tablet Take 1 tablet (50 mg total) by mouth at bedtime as needed for sleep.  Patient taking differently: Take 100 mg by mouth at bedtime as needed for sleep.  07/23/17   Money, Gerlene Burdock, FNP    Family History Family History  Problem Relation Age of Onset  . Hearing loss Mother   . Asthma Mother   . Asthma Father   . Diabetes Son   . Cancer Maternal Grandfather        bone  . Liver disease Maternal Grandfather   . Asthma Sister   . Lupus Sister     Social History Social History   Tobacco Use  . Smoking status: Current Some Day Smoker    Packs/day: 0.50    Years:  15.00    Pack years: 7.50    Types: Cigarettes  . Smokeless tobacco: Never Used  . Tobacco comment: Smoking .5 ppd  Substance Use Topics  . Alcohol use: Yes    Alcohol/week: 4.0 standard drinks    Types: 4 Cans of beer per week  . Drug use: No     Allergies   Penicillins and Norvasc [amlodipine besylate]   Review of Systems Review of Systems  Constitutional: Negative for chills, diaphoresis and fever.  Respiratory: Positive for cough, shortness of breath and wheezing.   Cardiovascular: Positive for chest pain (w/ coughing otherwise none). Negative for palpitations and leg swelling.  Gastrointestinal: Negative for abdominal pain, nausea and vomiting.  Genitourinary: Negative for dysuria.  Musculoskeletal: Negative for myalgias.  Neurological: Negative for syncope.  All other systems reviewed and are negative.    Physical Exam Updated Vital Signs BP (!) 143/90   Pulse (!) 117   Temp 98.5 F (36.9 C) (Oral)   Resp 18   SpO2 92%   Physical Exam Vitals signs and nursing note reviewed.  Constitutional:      General: She is not in acute distress.    Appearance: She is well-developed.  HENT:     Head: Normocephalic and atraumatic.     Right Ear: Ear canal normal. Tympanic membrane is not perforated, erythematous, retracted or bulging.     Left Ear: Ear canal normal. Tympanic membrane is not perforated, erythematous, retracted or bulging.     Ears:     Comments: No mastoid  erythema/swelling/tenderness.     Nose:     Right Sinus: No maxillary sinus tenderness or frontal sinus tenderness.     Left Sinus: No maxillary sinus tenderness or frontal sinus tenderness.     Mouth/Throat:     Pharynx: Uvula midline. No oropharyngeal exudate or posterior oropharyngeal erythema.     Comments: Posterior oropharynx is symmetric appearing. Patient tolerating own secretions without difficulty. No trismus. No drooling. No hot potato voice. No swelling beneath the tongue, submandibular compartment is soft.  Eyes:     General:        Right eye: No discharge.        Left eye: No discharge.     Conjunctiva/sclera: Conjunctivae normal.     Pupils: Pupils are equal, round, and reactive to light.  Neck:     Musculoskeletal: Normal range of motion and neck supple. No edema or neck rigidity.  Cardiovascular:     Rate and Rhythm: Regular rhythm. Tachycardia present.     Heart sounds: No murmur.  Pulmonary:     Effort: No tachypnea or respiratory distress.     Breath sounds: Wheezing (expiratory throughout) present. No rhonchi or rales.     Comments: SpO2 92-95% on RA during assessment.  Speaking in full sentences.  Chest:     Chest wall: Tenderness (mild anterior- reproduces chest discomfort with coughing) present.  Abdominal:     General: There is no distension.     Palpations: Abdomen is soft.     Tenderness: There is no abdominal tenderness.  Musculoskeletal:     Right lower leg: No edema.     Left lower leg: No edema.  Lymphadenopathy:     Cervical: No cervical adenopathy.  Skin:    General: Skin is warm and dry.     Findings: No rash.  Neurological:     Mental Status: She is alert.  Psychiatric:        Behavior: Behavior normal.  ED Treatments / Results  Labs (all labs ordered are listed, but only abnormal results are displayed) Labs Reviewed  COMPREHENSIVE METABOLIC PANEL - Abnormal; Notable for the following components:      Result Value   Potassium 3.4  (*)    All other components within normal limits  CBC WITH DIFFERENTIAL/PLATELET - Abnormal; Notable for the following components:   RBC 5.17 (*)    Hemoglobin 15.2 (*)    HCT 47.4 (*)    All other components within normal limits  NOVEL CORONAVIRUS, NAA (HOSP ORDER, SEND-OUT TO REF LAB; TAT 18-24 HRS)  D-DIMER, QUANTITATIVE (NOT AT [facility])  I-STAT BETA HCG BLOOD, ED ([facility], [facility], [facility] ONLY)    EKG EKG Interpretation  Date/Time:  Monday March 15 2019 14:46:00 EDT Ventricular Rate:  97 PR Interval:    QRS Duration: 82 QT Interval:  337 QTC Calculation: 428 R Axis:   62 Text Interpretation: Sinus rhythm No ST segment changes, no t wave inversions Confirmed by Marianna Fussykstra, Richard ([id number]) on 03/15/2019 4:11:06 PM   Radiology Dg Chest 2 View  Result Date: 03/15/2019 CLINICAL DATA:  Pt states she has an ongoing productive cough for 3 weeks, c/o little bit of chest pain + SOB due to coughing so much. PT hx of HTN and Asthma. EXAM: CHEST - 2 VIEW COMPARISON:  Chest radiograph 06/15/2017 FINDINGS: The heart size and mediastinal contours are within normal limits. The lungs are clear. No pneumothorax or pleural effusion. The visualized skeletal structures are unremarkable. IMPRESSION: No acute cardiopulmonary finding. Electronically Signed   By: Emmaline KluverNancy  Ballantyne M.D.   On: 03/15/2019 15:28    Procedures Procedures (including critical care time)  Medications Ordered in ED Medications  sodium chloride 0.9 % bolus 1,000 mL (0 mLs Intravenous Stopped 03/15/19 1534)  methylPREDNISolone sodium succinate (SOLU-MEDROL) 125 mg/2 mL injection 125 mg (125 mg Intravenous Given 03/15/19 1438)  albuterol (VENTOLIN HFA) 108 (90 Base) MCG/ACT inhaler 8 puff (8 puffs Inhalation Given 03/15/19 1439)  AeroChamber Plus Flo-Vu Medium MISC 1 each (1 each Other Given 03/15/19 1439)  ipratropium (ATROVENT HFA) inhaler 2 puff (2 puffs Inhalation Given 03/15/19 1439)  acetaminophen (TYLENOL) tablet 650 mg (650 mg Oral  Given 03/15/19 1526)     Initial Impression / Assessment and Plan / ED Course  I have reviewed the triage vital signs and the nursing notes.  Pertinent labs & imaging results that were available during my care of the patient were reviewed by me and considered in my medical decision making (see chart for details).   Patient presents to the emergency department with 283-month history of progressively worsening cough, shortness of breath, and wheezing.  She has been on 2 steroid courses to treat for asthma exacerbations and has also received a 5-day course of azithromycin each without improvement.  Inhalers were not helping much.  She is nontoxic-appearing, notably tachycardic with SPO2 92 to 95% I am in the exam room. She does not appear to be in respiratory distress. She has expiratory wheezing throughout. DDX: asthma exacerbation, CAP, covid, PE, pneumothorax, anemia.    CBC: No anemia/leukocytosis, elevated hgb/hct.  CMP: Mild hypokalemia. No significant electrolyte derangement. Renal & liver function preserved.  D-dimer: WNL EKG: no STEMI CXR: No acute cardiopulmonary finding.   Low risk wells, ddimer WNL- doubt PE EKG: No ST/T changes to suggest atypical ACS.  No concerning laboratory abnormalities.  CXR w/o infiltrate to suggest pneumonia, fluid overload, or PTX.   Re-assessed following albuterol/atrovent & steroids- Breath sounds clear,  no respiratory distress, SpO2 consistently > 95%, tachycardia improved. Suspect poorly controlled asthma w/ acute exacerbation, does not appear to require admission currently, she utilizes Qvar and another maintenance inhaler at home, will provide information for PCP/pulmonology follow up for further management & re-assessment. Prednisone taper & tessalon prescriptions. COVID felt to be less likely but swab sent- discussed quarantine/hand hygiene. I discussed results, treatment plan, need for follow-up, and return precautions with the patient. Provided  opportunity for questions, patient confirmed understanding and is in agreement with plan.   Findings and plan of care discussed with supervising physician Dr. Stevie Kern who is in agreement.   Final Clinical Impressions(s) / ED Diagnoses   Final diagnoses:  Exacerbation of asthma, unspecified asthma severity, unspecified whether persistent    ED Discharge Orders         Ordered    predniSONE (STERAPRED UNI-PAK 21 TAB) 10 MG (21) TBPK tablet  Daily     03/15/19 1614    benzonatate (TESSALON) 100 MG capsule  3 times daily PRN     03/15/19 1616           Patience Nuzzo, Wilmer R, PA-C 03/15/19 1623    Milagros Loll, MD 03/16/19 [phone]

## 2019-03-16 LAB — NOVEL CORONAVIRUS, NAA (HOSP ORDER, SEND-OUT TO REF LAB; TAT 18-24 HRS): SARS-CoV-2, NAA: NOT DETECTED

## 2019-03-24 ENCOUNTER — Other Ambulatory Visit: Payer: Self-pay

## 2019-03-24 ENCOUNTER — Ambulatory Visit: Payer: Medicaid Other | Admitting: Internal Medicine

## 2019-03-24 ENCOUNTER — Encounter: Payer: Self-pay | Admitting: Internal Medicine

## 2019-03-24 ENCOUNTER — Institutional Professional Consult (permissible substitution): Payer: Medicaid Other | Admitting: Pulmonary Disease

## 2019-03-24 DIAGNOSIS — F17210 Nicotine dependence, cigarettes, uncomplicated: Secondary | ICD-10-CM

## 2019-03-24 DIAGNOSIS — J4541 Moderate persistent asthma with (acute) exacerbation: Secondary | ICD-10-CM

## 2019-03-24 DIAGNOSIS — F17200 Nicotine dependence, unspecified, uncomplicated: Secondary | ICD-10-CM

## 2019-03-24 MED ORDER — FLUTICASONE-SALMETEROL 250-50 MCG/DOSE IN AEPB: 1.0000 | INHALATION_SPRAY | Freq: Two times a day (BID) | RESPIRATORY_TRACT | 11 refills | Status: DC

## 2019-03-24 MED ORDER — ALBUTEROL SULFATE (2.5 MG/3ML) 0.083% IN NEBU: 2.5000 mg | INHALATION_SOLUTION | Freq: Four times a day (QID) | RESPIRATORY_TRACT | 12 refills | Status: DC | PRN

## 2019-03-24 NOTE — Progress Notes (Signed)
Patient seen in the office today and instructed on use of Advair 250-63mcg.  Patient expressed understanding and demonstrated technique.  Benetta Spar [facility] 03/24/2019

## 2019-03-24 NOTE — Progress Notes (Signed)
[name]    [id number]    January 17, 1981  Primary Care Physician:Fulp, Hewitt Shorts, MD  Referring Physician: Cain Saupe, MD [address] [place],  [place] [address] Reason for Consultation: asthma Date of Consultation: 03/24/2019  Chief complaint:   Chief Complaint  Patient presents with  . Consult    Referred by ED for several asthma exacerbations over the past year. Increased SOB and cough. Semi-productive cough. Has been on several rounds of prednisone.    HPI:  Childhood asthma which was poorly controlled which has progressed as she has gotten older.  Several ED visits in the last year, symptoms improved with prednisone. She was given qvar and atrovent, albuterol but hasn't been able to afford qvar. She does not have a nebulizer machine at home.   Asthma symptoms are coughing, fatigue, shortness of breath, wheezing.  Her asthma was poorly controlled when she was pregnant with her second child.  She also notes having worsening asthma symptoms at night before she goes to sleep.  She believes this is when her anxiety is also poorly controlled.  Current Regimen: atrovent 3-4 times/day, albuterol 4-5 times/day plus proventil.  Asthma Triggers: cold weather/hot weather, animals, cigarettes, anxiety Exacerbations in the last year: 4-5 History of hospitalization or intubation: never been hospitalized Atopy: none Allergy Testing: never had.  GERD: history of bad reflux.  Allergic Rhinitis: yes has itchy/water eyes ACT:  Asthma Control Test ACT Total Score  03/24/2019 7  FeNO: n/a  Pets: has a cat and dog at home.  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  Smoking history: 1/2ppd x 15 years.  Relevant family history: mother, father and son all have asthma.    Outpatient Encounter Medications as of 03/24/2019  Medication Sig  . albuterol (PROVENTIL HFA;VENTOLIN HFA) 108 (90  Base) MCG/ACT inhaler Inhale 1-2 puffs into the lungs every 6 (six) hours as needed for wheezing or shortness of breath.  . ARIPiprazole (ABILIFY) 5 MG tablet Take 5 mg by mouth daily.  Marland Kitchen atomoxetine (STRATTERA) 40 MG capsule Take 40 mg by mouth daily.  Marland Kitchen BIOTIN MAXIMUM PO Take 1 tablet by mouth daily.  . hydrochlorothiazide (HYDRODIURIL) 25 MG tablet Take 1 tablet (25 mg total) by mouth daily.  . metoprolol tartrate (LOPRESSOR) 25 MG tablet Take 1 tablet (25 mg total) by mouth 2 (two) times daily.  . Multiple Vitamin (MULTIVITAMIN WITH MINERALS) TABS tablet Take 1 tablet by mouth daily.  . traZODone (DESYREL) 50 MG tablet Take 1 tablet (50 mg total) by mouth at bedtime as needed for sleep. (Patient taking differently: Take 100 mg by mouth at bedtime as needed for sleep. )  . albuterol (PROVENTIL) (2.5 MG/3ML) 0.083% nebulizer solution Take 3 mLs (2.5 mg total) by nebulization every 6 (six) hours as needed for wheezing or shortness of breath.  . Fluticasone-Salmeterol (ADVAIR) 250-50 MCG/DOSE AEPB Inhale 1 puff into the lungs every 12 (twelve) hours.  . [DISCONTINUED] azithromycin (ZITHROMAX) 250 MG tablet Take 1 tablet (250 mg total) by mouth daily. Take first 2 tablets together, then 1 every day until finished.  . [DISCONTINUED] beclomethasone (QVAR) 80 MCG/ACT inhaler Inhale 2 puffs into the lungs 2 (two) times daily. (Patient not taking: Reported on 03/24/2019)  . [DISCONTINUED] benzonatate (TESSALON) 100 MG capsule Take 1-2 capsules (100-200 mg total) by mouth 3 (three) times daily as needed for cough.  . [  DISCONTINUED] escitalopram (LEXAPRO) 20 MG tablet Take 20 mg by mouth daily.  . [DISCONTINUED] fluticasone (FLONASE) 50 MCG/ACT nasal spray Place 2 sprays into both nostrils daily. (Patient not taking: Reported on 03/21/2018)  . [DISCONTINUED] guanFACINE (INTUNIV) 1 MG TB24 ER tablet Take 1 mg by mouth daily.  . [DISCONTINUED] hydrOXYzine (ATARAX/VISTARIL) 25 MG tablet Take 1 tablet (25 mg  total) by mouth 3 (three) times daily as needed for anxiety. (Patient not taking: Reported on 03/21/2018)  . [DISCONTINUED] mirtazapine (REMERON) 15 MG tablet Take 1 tablet (15 mg total) by mouth at bedtime. For mood control (Patient not taking: Reported on 03/21/2018)  . [DISCONTINUED] nicotine (NICODERM CQ - DOSED IN MG/24 HOURS) 14 mg/24hr patch Place 1 patch (14 mg total) onto the skin daily.  . [DISCONTINUED] omeprazole (PRILOSEC) 40 MG capsule Take 1 capsule (40 mg total) by mouth daily.  . [DISCONTINUED] ondansetron (ZOFRAN ODT) 4 MG disintegrating tablet Take 1 tablet (4 mg total) by mouth every 8 (eight) hours as needed for nausea or vomiting.  . [DISCONTINUED] predniSONE (STERAPRED UNI-PAK 21 TAB) 10 MG (21) TBPK tablet Take by mouth daily. 6, 5, 4, 3, 2, 1   No facility-administered encounter medications on file as of 03/24/2019.     Allergies as of 03/24/2019 - Review Complete 03/24/2019  Allergen Reaction Noted  . Penicillins Nausea And Vomiting and Other (See Comments) 04/21/2016  . Norvasc [amlodipine besylate] Other (See Comments) 08/21/2015    Past Medical History:  Diagnosis Date  . Anxiety   . Asthma   . Depression    doing good, not on meds now  . GERD (gastroesophageal reflux disease)   . Headache    migraines  . History of kidney stones   . Hypertension   . Kidney stones   . Missed abortion 10/02/2013  . Ovarian cyst   . Pregnancy induced hypertension   . Suicidal intent   . Vaginal Pap smear, abnormal    bx, f/u ok    Past Surgical History:  Procedure Laterality Date  . CESAREAN SECTION    . CESAREAN SECTION N/A 06/06/2015   Procedure: CESAREAN SECTION;  Surgeon: Frederico Hamman, MD;  Location: [facility];  Service: Obstetrics;  Laterality: N/A;  . CHOLECYSTECTOMY N/A 08/23/2016   Procedure: LAPAROSCOPIC CHOLECYSTECTOMY;  Surgeon: Clovis Riley, MD;  Location: [facility];  Service: General;  Laterality: N/A;  . DILATION AND CURETTAGE OF UTERUS    . DILATION  AND EVACUATION N/A 10/04/2013   Procedure: DILATATION AND EVACUATION;  Surgeon: Janyth Contes, MD;  Location: [facility];  Service: Gynecology;  Laterality: N/A;  [place] in room   . TONSILLECTOMY    . TUBAL LIGATION      Family History  Problem Relation Age of Onset  . Hearing loss Mother   . Asthma Mother   . Asthma Father   . Diabetes Son   . Cancer Maternal Grandfather        bone  . Liver disease Maternal Grandfather   . Asthma Sister   . Lupus Sister     Social History   Socioeconomic History  . Marital status: Single    Spouse name: Not on file  . Number of children: Not on file  . Years of education: Not on file  . Highest education level: Not on file  Occupational History  . Not on file  Social Needs  . Financial resource strain: Not on file  . Food insecurity    Worry: Not on file  Inability: Not on file  . Transportation needs    Medical: Not on file    Non-medical: Not on file  Tobacco Use  . Smoking status: Current Some Day Smoker    Packs/day: 0.50    Years: 15.00    Pack years: 7.50    Types: Cigarettes  . Smokeless tobacco: Never Used  . Tobacco comment: Smoking .5 ppd  Substance and Sexual Activity  . Alcohol use: Yes    Alcohol/week: 4.0 standard drinks    Types: 4 Cans of beer per week  . Drug use: No  . Sexual activity: Yes    Partners: Male    Birth control/protection: Surgical  Lifestyle  . Physical activity    Days per week: Not on file    Minutes per session: Not on file  . Stress: Not on file  Relationships  . Social Musician on phone: Not on file    Gets together: Not on file    Attends religious service: Not on file    Active member of club or organization: Not on file    Attends meetings of clubs or organizations: Not on file    Relationship status: Not on file  . Intimate partner violence    Fear of current or ex partner: Not on file    Emotionally abused: Not on file    Physically abused: Not on file     Forced sexual activity: Not on file  Other Topics Concern  . Not on file  Social History Narrative  . Not on file    Review of systems: Review of Systems  Constitutional: Negative for fever and chills.  HENT: Positive for itchy watery eyes Eyes: Negative for blurred vision.  Respiratory: as per HPI  Cardiovascular: Occasional chest pain associated with anxiety Gastrointestinal: No abdominal pain Genitourinary: Negative for dysuria, urgency, frequency and hematuria.  Musculoskeletal: Negative for myalgias, back pain and joint pain.  Skin: Negative for itching and rash.  Neurological: Occasional dizziness, no syncope Endo/Heme/Allergies: Negative for environmental allergies.  Psychiatric/Behavioral: Positive for anxiety, and for depression.  Denies suicidal ideation All other systems reviewed and are negative.  Physical Exam: Today's Vitals   03/24/19 1424  BP: 128/86  Pulse: 82  Temp: (!) 97.4 F (36.3 C)  TempSrc: Temporal  SpO2: 94%  Weight: 162 lb (73.5 kg)  Height:  (1.626 m)   Body mass index is 27.81 kg/m.  Gen:      No acute distress HEENT:  EOMI, sclera anicteric, there is cobblestoning in the oropharynx Neck:     No masses; no thyromegaly Lungs:    Clear to auscultation bilaterally; normal respiratory effort, no wheezes or crackles. CV:         Regular rate and rhythm; no murmurs Abd:      + bowel sounds; soft, non-tender; no palpable masses, no distension Ext:    No edema; adequate peripheral perfusion Skin:      Warm and dry; no rash Neuro: alert and oriented x 3 Psych: Flat affect  Data Reviewed: Imaging: I have personally reviewed her multiple chest x-ray imaging on file including chest x-ray on 03/15/2019.  Negative for any pneumonia effusion or nodules  PFTs:  Pulmonary function testing in 2017 demonstrates an FEV1 to FVC ratio 55%.  There was no bronchodilator challenge performed. Recent Review Flowsheet Data    Spirometry Latest Ref Rng &  Units 11/16/2015   FVC-%PRED-PRE % 94   FEV1-PRE L 1.96  FEV1-%PRED-PRE % 62   DLCO [facility] ml/min/mmHg 23.29     Pulmonary Functions Testing Results:  TLC  Date Value Ref Range Status  11/16/2015 8.11 L Final    Labs: CBC reviewed demonstrates no peripheral eosinophilia  Assessment:  Moderate persistent asthma, with poor control. -ACT of 7 today Asthma COPD overlap syndrome Tobacco use disorder Anxiety and depression, not well controlled  I personally spent 6 minutes counseling the patient regarding tobacco use disorder.  Patient is symptomatic from tobacco use disorder due to the following condition: asthma.  The patient's response was pre-contemplative.  We discussed nicotine replacement therapy, Wellbutrin, Chantix.  We identified to gather patient specific barriers to change.  The patient is open to future discussions about tobacco cessation.  Plan/Recommendations: [name] has longstanding asthma which has now been worsened with tobacco use.  Suspect she has ongoing trigger closure due to animals at home as well as cigarette use.  She has a partner who smokes at home as well.  We will initiate Advair Diskus twice daily as a controller medication for asthma.  I have refilled her as needed albuterol as well.  Management of her anxiety and depression is cornerstone in helping manage her asthma as well.  I will see her back in short follow-up as we are starting a new medication.   Return to Care: Return in about 4 weeks (around 04/21/2019).  Durel SaltsNikita Ricquel Foulk, MD Pulmonary and Critical Care Medicine [facility] Pager: [phone] Office:[phone]  CC: Cain SaupeFulp, Cammie, MD

## 2019-03-24 NOTE — Patient Instructions (Signed)
By learning about asthma and how it can be controlled, you take an important step toward managing this disease. Work closely with your asthma care team to learn all you can about your asthma, how to avoid triggers, what your medications do, and how to take them correctly. With proper care, you can live free of asthma symptoms and maintain a normal, healthy lifestyle.   What is asthma? Asthma is a chronic disease that affects the airways of the lungs. During normal breathing, the bands of muscle that surround the airways are relaxed and air moves freely. During an asthma episode or "attack," there are three main changes that stop air from moving easily through the airways:  The bands of muscle that surround the airways tighten and make the airways narrow. This tightening is called bronchospasm.   The lining of the airways becomes swollen or inflamed.   The cells that line the airways produce more mucus, which is thicker than normal and clogs the airways.  These three factors - bronchospasm, inflammation, and mucus production - cause symptoms such as difficulty breathing, wheezing, and coughing.  What are the most common symptoms of asthma? Asthma symptoms are not the same for everyone. They can even change from episode to episode in the same person. Also, you may have only one symptom of asthma, such as cough, but another person may have all the symptoms of asthma. It is important to know all the symptoms of asthma and to be aware that your asthma can present in any of these ways at any time. The most common symptoms include: . Coughing, especially at night  . Shortness of breath  . Wheezing  . Chest tightness, pain, or pressure   Who is affected by asthma? Asthma affects 22 million Americans; about 6 million of these are children under age 80. People who have a family history of asthma have an increased risk of developing the disease. Asthma is also more common in people who have allergies or  who are exposed to tobacco smoke. However, anyone can develop asthma at any time. Some people may have asthma all of their lives, while others may develop it as adults.  What causes asthma? The airways in a person with asthma are very sensitive and react to many things, or "triggers." Contact with these triggers causes asthma symptoms. One of the most important parts of asthma control is to identify your triggers and then avoid them when possible. The only trigger you do not want to avoid is exercise. Pre-treatment with medicines before exercise can allow you to stay active yet avoid asthma symptoms. Common asthma triggers include: 1. Infections (colds, viruses, flu, sinus infections)  2. Exercise  3. Weather (changes in temperature and/or humidity, cold air)  4. Tobacco smoke  5. Allergens (dust mites, pollens, pets, mold spores, cockroaches, and sometimes foods)  6. Irritants (strong odors from cleaning products, perfume, wood smoke, air pollution)  7. Strong emotions such as crying or laughing hard  8. Some medications   How is asthma diagnosed? To diagnose asthma, your doctor will first review your medical history, family history, and symptoms. Your doctor will want to know any past history of breathing problems you may have had, as well as a family history of asthma, allergies, eczema (a bumpy, itchy skin rash caused by allergies), or other lung disease. It is important that you describe your symptoms in detail (cough, wheeze, shortness of breath, chest tightness), including when and how often they occur. The doctor will  perform a physical examination and listen to your heart and lungs. He or she may also order breathing tests, allergy tests, blood tests, and chest and sinus X-rays. The tests will find out if you do have asthma and if there are any other conditions that are contributing factors.  How is asthma treated? Asthma can be controlled, but not cured. It is not normal to have frequent  symptoms, trouble sleeping, or trouble completing tasks. Appropriate asthma care will prevent symptoms and visits to the emergency room and hospital. Asthma medicines are one of the mainstays of asthma treatment. The drugs used to treat asthma are explained below.  Anti-inflammatories: These are the most important drugs for most people with asthma. Anti-inflammatory drugs reduce swelling and mucus production in the airways. As a result, airways are less sensitive and less likely to react to triggers. These medications need to be taken daily and may need to be taken for several weeks before they begin to control asthma. Anti-inflammatory medicines lead to fewer symptoms, better airflow, less sensitive airways, less airway damage, and fewer asthma attacks. If taken every day, they CONTROL or prevent asthma symptoms.   Bronchodilators: These drugs relax the muscle bands that tighten around the airways. This action opens the airways, letting more air in and out of the lungs and improving breathing. Bronchodilators also help clear mucus from the lungs. As the airways open, the mucus moves more freely and can be coughed out more easily. In short-acting forms, bronchodilators RELIEVE or stop asthma symptoms by quickly opening the airways and are very helpful during an asthma episode. In long-acting forms, bronchodilators provide CONTROL of asthma symptoms and prevent asthma episodes.  Asthma drugs can be taken in a variety of ways. Inhaling the medications by using a metered dose inhaler, dry powder inhaler, or nebulizer is one way of taking asthma medicines. Oral medicines (pills or liquids you swallow) may also be prescribed.  Asthma severity Asthma is classified as either "intermittent" (comes and goes) or "persistent" (lasting). Persistent asthma is further described as being mild, moderate, or severe. The severity of asthma is based on how often you have symptoms both during the day and night, as well as by  the results of lung function tests and by how well you can perform activities. The "severity" of asthma refers to how "intense" or "strong" your asthma is.  Asthma control Asthma control is the goal of asthma treatment. Regardless of your asthma severity, it may or may not be controlled. Asthma control means: . You are able to do everything you want to do at work and home  . You have no (or minimal) asthma symptoms  . You do not wake up from your sleep or earlier than usual in the morning due to asthma  . You rarely need to use your reliever medicine (inhaler)  Another major part of your treatment is that you are happy with your asthma care and believe your asthma is controlled.  Monitoring symptoms A key part of treatment is keeping track of how well your lungs are working. Monitoring your symptoms  what they are, how and when they happen, and how severe they are  is an important part of being able to control your asthma.  Sometimes asthma is monitored using a peak flow meter. A peak flow (PF) meter measures how fast the air comes out of your lungs. It can help you know when your asthma is getting worse, sometimes even before you have symptoms. By taking daily  peak flow readings, you can learn when to adjust medications to keep asthma under good control. It is also used to create your asthma action plan (see below). Your doctor can use your peak flow readings to adjust your treatment plan in some cases.  Asthma Action Plan Based on your history and asthma severity, you and your doctor will develop a care plan called an "asthma action plan." The asthma action plan describes when and how to use your medicines, actions to take when asthma worsens, and when to seek emergency care. Make sure you understand this plan. If you do not, ask your asthma care provider any questions you may have. Your asthma action plan is one of the keys to controlling asthma. Keep it readily available to remind you of  what you need to do every day to control asthma and what you need to do when symptoms occur.  Goals of asthma therapy These are the goals of asthma treatment: . Live an active, normal life  . Prevent chronic and troublesome symptoms  . Attend work or school every day  . Perform daily activities without difficulty  . Stop urgent visits to the doctor, emergency department, or hospital  . Use and adjust medications to control asthma with few or no side effects   What are the benefits of quitting smoking? Quitting smoking can lower your chances of getting or dying from heart disease, lung disease, kidney failure, infection, or cancer. It can also lower your chances of getting osteoporosis, a condition that makes your bones weak. Plus, quitting smoking can help your skin look younger and reduce the chances that you will have problems with sex.  Quitting smoking will improve your health no matter how old you are, and no matter how long or how much you have smoked.  What should I do if I want to quit smoking? The letters in the word "START" can help you remember the steps to take: S = Set a quit date. T = Tell family, friends, and the people around you that you plan to quit. A = Anticipate or plan ahead for the tough times you'll face while quitting. R = Remove cigarettes and other tobacco products from your home, car, and work. T = Talk to your doctor about getting help to quit.  How can my doctor or nurse help? Your doctor or nurse can give you advice on the best way to quit. He or she can also put you in touch with counselors or other people you can call for support. Plus, your doctor or nurse can give you medicines to: ?Reduce your craving for cigarettes ?Reduce the unpleasant symptoms that happen when you stop smoking (called "withdrawal symptoms"). You can also get help from a free phone line (1-800-QUIT-NOW) or go online to [employer].  What are the symptoms of withdrawal? The  symptoms include: ?Trouble sleeping ?Being irritable, anxious or restless ?Getting frustrated or angry ?Having trouble thinking clearly  Some people who stop smoking become temporarily depressed. Some people need treatment for depression, such as counseling or antidepressant medicines. Depressed people might: ?No longer enjoy or care about doing the things they used to like to do ?Feel sad, down, hopeless, nervous, or cranky most of the day, almost every day ?Lose or gain weight ?Sleep too much or too little ?Feel tired or like they have no energy ?Feel guilty or like they are worth nothing ?Forget things or feel confused ?Move and speak more slowly than usual ?Act restless or have  trouble staying still ?Think about death or suicide  If you think you might be depressed, see your doctor or nurse. Only someone trained in mental health can tell for sure if you are depressed. If you ever feel like you might hurt yourself, go straight to the nearest emergency department. Or you can call for an ambulance (in the [place] and [place], dial 9-1-1) or call your doctor or nurse right away and tell them it is an emergency. You can also reach the [place] National Suicide Prevention Lifeline at (224) 845-04551-937-660-8446 or http://hill.com/www.suicidepreventionlifeline.org.  How do medicines help you stop smoking? Different medicines work in different ways: ?Nicotine replacement therapy eases withdrawal and reduces your body's craving for nicotine, the main drug found in cigarettes. There are different forms of nicotine replacement, including skin patches, lozenges, gum, nasal sprays, and "puffers" or inhalers. Many can be bought without a prescription, while others might require one. ?Bupropion is a prescription medicine that reduces your desire to smoke. This medicine is sold under the brand names Zyban and Wellbutrin. It is also available in a generic version, which is cheaper than brand name medicines. ?Varenicline (brand names: Chantix,  Champix) is a prescription medicine that reduces withdrawal symptoms and cigarette cravings. If you think you'd like to take varenicline and you have a history of depression, anxiety, or heart disease, discuss this with your doctor or nurse before taking the medicine. Varenicline can also increase the effects of alcohol in some people. It's a good idea to limit drinking while you're taking it, at least until you know how it affects you.  How does counseling work? Counseling can happen during formal office visits or just over the phone. A counselor can help you: ?Figure out what triggers your smoking and what to do instead ?Overcome cravings ?Figure out what went wrong when you tried to quit before  What works best? Studies show that people have the best luck at quitting if they take medicines to help them quit and work with a Veterinary surgeoncounselor. It might also be helpful to combine nicotine replacement with one of the prescription medicines that help people quit. In some cases, it might even make sense to take bupropion and varenicline together.  What about e-cigarettes? Sometimes people wonder if using electronic cigarettes, or "e-cigarettes," might help them quit smoking. Using e-cigarettes is also called "vaping." Doctors do not recommend e-cigarettes in place of medicines and counseling. That's because e-cigarettes still contain nicotine as well as other substances that might be harmful. It's not clear how they can affect a person's health in the long term. Will I gain weight if I quit? Yes, you might gain a few pounds. But quitting smoking will have a much more positive effect on your health than weighing a few pounds more. Plus, you can help prevent some weight gain by being more active and eating less. Taking the medicine bupropion might help control weight gain.   What else can I do to improve my chances of quitting? You can: ?Start exercising. ?Stay away from smokers and places that you associate  with smoking. If people close to you smoke, ask them to quit with you. ?Keep gum, hard candy, or something to put in your mouth handy. If you get a craving for a cigarette, try one of these instead. ?Don't give up, even if you start smoking again. It takes most people a few tries before they succeed.  What if I am pregnant and I smoke? If you are pregnant, it's really important for  the health of your baby that you quit. Ask your doctor what options you have, and what is safest for your baby

## 2019-03-26 ENCOUNTER — Telehealth: Payer: Self-pay | Admitting: Pulmonary Disease

## 2019-03-26 MED ORDER — FLUTICASONE-SALMETEROL 250-50 MCG/DOSE IN AEPB: 1.0000 | INHALATION_SPRAY | Freq: Two times a day (BID) | RESPIRATORY_TRACT | 11 refills | Status: DC

## 2019-03-26 NOTE — Telephone Encounter (Signed)
Called [facility], rep Anderson Malta stated that Medicaid is rejecting the claim due to Dr. Shearon Stalls not being a registered Medicaid provider. I ran a test claim under [facility], and Medicaid paid with $3.00 copay. We would need to send in a new prescription for patient.  1:44 PM Beatriz Chancellor, CPhT

## 2019-03-26 NOTE — Telephone Encounter (Signed)
Spoke with [name] who is app of the day due to pt not seeing anyone at office since 2017 prior to seeing Dr. Shearon Stalls on 11/4. Beth said it is okay to place Rx under her for pt's med.   New Rx for pt's Advair has been placed under Beth. Called pt's pharmacy and spoke with Ebony Hail stating to her that Rx for pt's Advair was sent in under a different provider so we needed the previous Rx to be disposed of. Ebony Hail said she received the new Rx and that she would handle this for pt.   Attempted to call pt to let her know that we got everything taken care of in regards to the Advair Rx but unable to reach her. Left message for pt to return call.

## 2019-03-26 NOTE — Telephone Encounter (Signed)
Called and spoke with pt who said the Advair is not covered by insurance and cost for that out of pocket is $131. Due to this, pt was requesting to have a different inhaler prescribed.  Dr. Shearon Stalls, please advise on this for pt. Thanks!

## 2019-03-26 NOTE — Telephone Encounter (Signed)
Ok to substitute for another ICS-LABA - Wixela may be covered.  Will route to pharmacy to assist with a cheaper alternative to advair.  Spero Geralds

## 2019-03-29 NOTE — Telephone Encounter (Signed)
Spoke with pt. She was already aware this had been taken care of. Nothing further was needed.

## 2019-07-06 ENCOUNTER — Ambulatory Visit: Payer: Medicaid Other | Admitting: Internal Medicine

## 2019-07-14 ENCOUNTER — Ambulatory Visit: Payer: Medicaid Other | Admitting: Internal Medicine

## 2019-07-15 ENCOUNTER — Encounter: Payer: Self-pay | Admitting: Internal Medicine

## 2019-07-15 ENCOUNTER — Ambulatory Visit: Payer: Medicaid Other | Admitting: Internal Medicine

## 2019-07-15 ENCOUNTER — Other Ambulatory Visit: Payer: Self-pay

## 2019-07-15 VITALS — BP 138/110 | HR 79 | Temp 97.3°F | Ht 64.0 in | Wt 172.8 lb

## 2019-07-15 DIAGNOSIS — Z716 Tobacco abuse counseling: Secondary | ICD-10-CM | POA: Diagnosis not present

## 2019-07-15 DIAGNOSIS — J449 Chronic obstructive pulmonary disease, unspecified: Secondary | ICD-10-CM

## 2019-07-15 MED ORDER — VARENICLINE TARTRATE 0.5 MG X 11 & 1 MG X 42 PO MISC: ORAL | 0 refills | Status: DC

## 2019-07-15 MED ORDER — VARENICLINE TARTRATE 1 MG PO TABS: 1.0000 mg | ORAL_TABLET | Freq: Two times a day (BID) | ORAL | 3 refills | Status: DC

## 2019-07-15 MED ORDER — PANTOPRAZOLE SODIUM 40 MG PO TBEC: 40.0000 mg | DELAYED_RELEASE_TABLET | Freq: Every day | ORAL | 11 refills | Status: DC

## 2019-07-15 MED ORDER — FLUTICASONE-SALMETEROL 250-50 MCG/DOSE IN AEPB: 1.0000 | INHALATION_SPRAY | Freq: Two times a day (BID) | RESPIRATORY_TRACT | 11 refills | Status: DC

## 2019-07-15 NOTE — Progress Notes (Signed)
[name]    [id number]    1980/11/15  Primary Care Physician:Fulp, Hewitt Shorts, MD Date of Appointment: 07/15/2019 Established Patient Visit  Chief complaint:   Chief Complaint  Patient presents with  . Follow-up    Patient is here for follow up for asthma. Patient feels good overall.     HPI: Moderate persistent asthma, diagnosed at age 39.  She has poor control.  Established with me in fall 2020.  Interval Updates:  Advair is twice a day. Having some sore throat but not gargling consistently Albuterol MDI taking 1-2 times/day. Waking up at night 2-3 times/night. So total 4-5 times/day.  Reflux is poorly controlled, on no medication. Drinks alcohol and caffeine. Currently smoking 1/2 PPD.  No hospitalizations or ED visits since her last visit with me.   Current Regimen: advair, albuterol  Asthma Triggers: cold weather/hot weather, animals, cigarettes, anxiety Exacerbations in the last year: 4-5 History of hospitalization or intubation: never been hospitalized Atopy: none Allergy Testing: never had.  GERD: history of bad reflux.  Allergic Rhinitis: yes has itchy/water eyes ACT:  Asthma Control Test ACT Total Score  07/15/2019 11  03/24/2019 7   FeNO: n/a  I have reviewed the patient's family social and past medical history and updated as appropriate.   Past Medical History:  Diagnosis Date  . Anxiety   . Asthma   . Depression    doing good, not on meds now  . GERD (gastroesophageal reflux disease)   . Headache    migraines  . History of kidney stones   . Hypertension   . Kidney stones   . Missed abortion 10/02/2013  . Ovarian cyst   . Pregnancy induced hypertension   . Suicidal intent   . Vaginal Pap smear, abnormal    bx, f/u ok    Past Surgical History:  Procedure Laterality Date  . CESAREAN SECTION    . CESAREAN SECTION N/A 06/06/2015   Procedure: CESAREAN SECTION;  Surgeon: Kathreen Cosier, MD;  Location: [facility];  Service:  Obstetrics;  Laterality: N/A;  . CHOLECYSTECTOMY N/A 08/23/2016   Procedure: LAPAROSCOPIC CHOLECYSTECTOMY;  Surgeon: Berna Bue, MD;  Location: [facility];  Service: General;  Laterality: N/A;  . DILATION AND CURETTAGE OF UTERUS    . DILATION AND EVACUATION N/A 10/04/2013   Procedure: DILATATION AND EVACUATION;  Surgeon: Sherian Rein, MD;  Location: [facility];  Service: Gynecology;  Laterality: N/A;  [place] in room   . TONSILLECTOMY    . TUBAL LIGATION      Family History  Problem Relation Age of Onset  . Hearing loss Mother   . Asthma Mother   . Asthma Father   . Diabetes Son   . Cancer Maternal Grandfather        bone  . Liver disease Maternal Grandfather   . Asthma Sister   . Lupus Sister     Social History   Occupational History  . Not on file  Tobacco Use  . Smoking status: Current Some Day Smoker    Packs/day: 0.25    Years: 15.00    Pack years: 3.75    Types: Cigarettes  . Smokeless tobacco: Never Used  Substance and Sexual Activity  . Alcohol use: Not Currently    Alcohol/week: 4.0 standard drinks    Types: 4 Cans of beer per week  . Drug use: No  . Sexual activity: Yes    Partners: Male  Birth control/protection: Surgical    Review of systems: Constitutional: No fevers, chills, night sweats, or weight loss. CV: No chest pain, or palpitations. Resp: No hemoptysis.  Physical Exam: Blood pressure (!) 138/110, pulse 79, temperature (!) 97.3 F (36.3 C), temperature source Temporal, height 5\' 4"  (1.626 m), weight 172 lb 12.8 oz (78.4 kg), SpO2 92 %.  Gen:      No acute distress ENT:  no nasal polyps, mucus membranes moist Lungs:    No increased respiratory effort, symmetric chest wall excursion, clear to auscultation bilaterally, no wheezes or crackles CV:         Regular rate and rhythm; no murmurs, rubs, or gallops.  No pedal edema  Data Reviewed: Imaging: I have personally reviewed the chest xray from 02/2019 which demonstrates no acute  cardiopulmonary process  PFTs:  PFT Results Latest Ref Rng & Units 11/16/2015  FVC-Pre L 3.54  FVC-Predicted Pre % 94  Pre FEV1/FVC % % 55  FEV1-Pre L 1.96  FEV1-Predicted Pre % 62  DLCO UNC% % 95  DLCO COR %Predicted % 97  TLC L 8.11  TLC % Predicted % 160  RV % Predicted % 292   I have personally reviewed the patient's PFTs and which demonstrate moderate airflow limitation   Labs:  Immunization status: Immunization History  Administered Date(s) Administered  . Influenza,inj,Quad PF,6+ Mos 07/29/2014, 06/07/2015, 07/21/2017, 02/05/2018, 03/03/2019  . Pneumococcal Polysaccharide-23 07/29/2014, 06/08/2015  . Tdap 06/07/2015    Assessment:  Moderate persistent asthma, with poor control.   ACT 11. Asthma COPD overlap syndrome Tobacco use disorder Anxiety and depression, not well controlled  Plan/Recommendations: Continue fluticasone-salmeterol.  Continue albuterol as needed. Start PPI for reflux as this is making her asthma control worse.  This may be especially helpful since her nighttime symptoms are worse.  I personally spent 10 minutes counseling the patient regarding tobacco use disorder.  Patient is symptomatic from tobacco use disorder due to the following condition: Asthma and COPD.  The patient's response was contemplative.  We discussed nicotine replacement therapy, Wellbutrin, Chantix.  We identified to gather patient specific barriers to change.  The patient is open to future discussions about tobacco cessation.  She would like to try Chantix today.  We discussed risks and benefits as well as adverse effects.  Will let me know if she has any mood changes after starting the medications.  At some point after smoking cessation getting blood work to evaluate for eosinophilia and elevated IgE as a target for biologic therapy for asthma may be useful.  Return to Care: Return in about 3 months (around 10/12/2019).   Lenice Llamas, MD [facility] [place]

## 2019-07-15 NOTE — Patient Instructions (Addendum)
Get an acid reflux or wedge pillow.  Start taking the new medication pantoprazole.  We are starting Chantix to help you quit smoking  Varenicline -- Varenicline (brand name: Chantix) is a prescription medication that works in the brain to reduce nicotine withdrawal symptoms and cigarette cravings. In several studies, it was more effective than placebo (a lookalike substitute that contains no medication.)  It should be taken after eating with a full glass of water as follows: ?One 0.5 mg tablet daily for three days ?One 0.5 mg tablet twice daily for the next four days ?One 1.0 mg tablet twice daily starting at day 7  You should plan to quit smoking between one and four weeks after starting varenicline.   You should continue it for 12 weeks before concluding if it is working; if you successfully quit at 12 weeks, you may continue taking it for an additional 12 weeks. If you have not quit after taking varenicline for 12 weeks, talk to your health care provider about the next step. Options include working harder to make behavioral changes and continuing varenicline or switching to another treatment.  Common side effects of varenicline include nausea and abnormal dreams.  In the very early years of Chantix there were some concerns about Chantix affecting mood and depression.  However, there has been lots of research on this and this is no longer a concern.  Chantix is considered safe to use even in patients taking medication for depression.   In 2011, the [place] Food and Drug Administration (FDA) issued an advisory that, in people who already have heart or blood vessel disease, varenicline may increase the risk of acute heart problems. If there is such a risk, it appears to be small, and is likely outweighed by the benefits of quitting smoking.   What is GERD? Gastroesophageal reflux disease (GERD) is gastroesophageal reflux diseasewhich occurs when the lower esophageal sphincter (LES) opens  spontaneously, for varying periods of time, or does not close properly and stomach contents rise up into the esophagus. GER is also called acid reflux or acid regurgitation, because digestive juices--called acids--rise up with the food. The esophagus is the tube that carries food from the mouth to the stomach. The LES is a ring of muscle at the bottom of the esophagus that acts like a valve between the esophagus and stomach.  When acid reflux occurs, food or fluid can be tasted in the back of the mouth. When refluxed stomach acid touches the lining of the esophagus it may cause a burning sensation in the chest or throat called heartburn or acid indigestion. Occasional reflux is common. Persistent reflux that occurs more than twice a week is considered GERD, and it can eventually lead to more serious health problems. People of all ages can have GERD. Studies have shown that GERD may worsen or contribute to asthma, chronic cough, and pulmonary fibrosis.   What are the symptoms of GERD? The main symptom of GERD in adults is frequent heartburn, also called acid indigestion--burning-type pain in the lower part of the mid-chest, behind the breast bone, and in the mid-abdomen.  Not all reflux is acidic in nature, and many patients don't have heart burn at all. Sometimes it feels like a cough (either dry or with mucus), choking sensation, asthma, shortness of breath, waking up at night, frequent throat clearing, or trouble swallowing.    What causes GERD? The reason some people develop GERD is still unclear. However, research shows that in people with GERD, the  LES relaxes while the rest of the esophagus is working. Anatomical abnormalities such as a hiatal hernia may also contribute to GERD. A hiatal hernia occurs when the upper part of the stomach and the LES move above the diaphragm, the muscle wall that separates the stomach from the chest. Normally, the diaphragm helps the LES keep acid from rising up into  the esophagus. When a hiatal hernia is present, acid reflux can occur more easily. A hiatal hernia can occur in people of any age and is most often a normal finding in otherwise healthy people over age 42. Most of the time, a hiatal hernia produces no symptoms.   Other factors that may contribute to GERD include - Obesity or recent weight gain - Pregnancy  - Smoking  - Diet - Certain medications  Common foods that can worsen reflux symptoms include: - carbonated beverages - artificial sweeteners - citrus fruits  - chocolate  - drinks with caffeine or alcohol  - fatty and fried foods  - garlic and onions  - mint flavorings  - spicy foods  - tomato-based foods, like spaghetti sauce, salsa, chili, and pizza   Lifestyle Changes If you smoke, stop.  Avoid foods and beverages that worsen symptoms (see above.) Lose weight if needed.  Eat small, frequent meals.  Wear loose-fitting clothes.  Avoid lying down for 3 hours after a meal.  Raise the head of your bed 6 to 8 inches by securing wood blocks under the bedposts. Just using extra pillows will not help, but using a wedge-shaped pillow may be helpful.  Medications  H2 blockers, such as cimetidine (Tagamet HB), famotidine (Pepcid AC), nizatidine (Axid AR), and ranitidine (Zantac 75), decrease acid production. They are available in prescription strength and over-the-counter strength. These drugs provide short-term relief and are effective for about half of those who have GERD symptoms.  Proton pump inhibitors include omeprazole (Prilosec, Zegerid), lansoprazole (Prevacid), pantoprazole (Protonix), rabeprazole (Aciphex), and esomeprazole (Nexium), which are available by prescription. Prilosec is also available in over-the-counter strength. Proton pump inhibitors are more effective than H2 blockers and can relieve symptoms and heal the esophageal lining in almost everyone who has GERD.  Because drugs work in different ways, combinations of  medications may help control symptoms. People who get heartburn after eating may take both antacids and H2 blockers. The antacids work first to neutralize the acid in the stomach, and then the H2 blockers act on acid production. By the time the antacid stops working, the H2 blocker will have stopped acid production. Your health care provider is the best source of information about how to use medications for GERD.   Points to Remember 1. You can have GERD without having heartburn. Your symptoms could include a dry cough, asthma symptoms, or trouble swallowing.  2. Taking medications daily as prescribed is important in controlling you symptoms.  Sometimes it can take up to 8 weeks to fully achieve the effects of the medications prescribed.  3. Coughing related to GERD can be difficult to treat and is very frustrating!  However, it is important to stick with these medications and lifestyle modifications before pursuing more aggressive or invasive test and treatments.

## 2019-08-13 ENCOUNTER — Other Ambulatory Visit: Payer: Self-pay | Admitting: Physician Assistant

## 2019-08-13 DIAGNOSIS — I10 Essential (primary) hypertension: Secondary | ICD-10-CM

## 2019-08-16 ENCOUNTER — Ambulatory Visit: Payer: Medicaid Other | Attending: Family Medicine | Admitting: Family Medicine

## 2019-08-16 ENCOUNTER — Telehealth: Payer: Self-pay | Admitting: Pharmacist

## 2019-08-16 ENCOUNTER — Other Ambulatory Visit: Payer: Self-pay

## 2019-08-16 ENCOUNTER — Encounter: Payer: Self-pay | Admitting: Family Medicine

## 2019-08-16 DIAGNOSIS — Z79899 Other long term (current) drug therapy: Secondary | ICD-10-CM | POA: Diagnosis not present

## 2019-08-16 DIAGNOSIS — R42 Dizziness and giddiness: Secondary | ICD-10-CM | POA: Diagnosis not present

## 2019-08-16 DIAGNOSIS — R5383 Other fatigue: Secondary | ICD-10-CM

## 2019-08-16 DIAGNOSIS — I10 Essential (primary) hypertension: Secondary | ICD-10-CM | POA: Diagnosis not present

## 2019-08-16 MED ORDER — METOPROLOL TARTRATE 25 MG PO TABS: 25.0000 mg | ORAL_TABLET | Freq: Two times a day (BID) | ORAL | 1 refills | Status: DC

## 2019-08-16 MED ORDER — HYDROCHLOROTHIAZIDE 25 MG PO TABS: 25.0000 mg | ORAL_TABLET | Freq: Every day | ORAL | 1 refills | Status: DC

## 2019-08-16 NOTE — Progress Notes (Signed)
Virtual Visit via Telephone Note  I connected with [name] on 08/16/19 at  2:10 PM EDT by telephone and verified that I am speaking with the correct person using two identifiers.   I discussed the limitations, risks, security and privacy concerns of performing an evaluation and management service by telephone and the availability of in person appointments. I also discussed with the patient that there may be a patient responsible charge related to this service. The patient expressed understanding and agreed to proceed.  Patient Location: Home Provider Location: [facility] Others participating in call: Call initiated by Emilio Aspen, RMA who then transferred the call to me   History of Present Illness:         39 year old female seen in follow-up of hypertension and patient requests refills of her current medications, hydrochlorothiazide and metoprolol.  She does not check her blood pressures at home.  She denies any headaches related to her blood pressure but has had some recent issues with dizziness, especially with bending over or with changes in position.  She also reports increased fatigue.  She has had no other issues such as chest pain or palpitations, no shortness of breath or cough, no abdominal pain.  She would be interested in having some blood work if she has not had any done in a while.   Past Medical History:  Diagnosis Date  . Anxiety   . Asthma   . Depression    doing good, not on meds now  . GERD (gastroesophageal reflux disease)   . Headache    migraines  . History of kidney stones   . Hypertension   . Kidney stones   . Missed abortion 10/02/2013  . Ovarian cyst   . Pregnancy induced hypertension   . Suicidal intent   . Vaginal Pap smear, abnormal    bx, f/u ok    Past Surgical History:  Procedure Laterality Date  . CESAREAN SECTION    . CESAREAN SECTION N/A 06/06/2015   Procedure: CESAREAN SECTION;  Surgeon: Frederico Hamman, MD;  Location: [facility];  Service: Obstetrics;  Laterality: N/A;  . CHOLECYSTECTOMY N/A 08/23/2016   Procedure: LAPAROSCOPIC CHOLECYSTECTOMY;  Surgeon: Clovis Riley, MD;  Location: [facility];  Service: General;  Laterality: N/A;  . DILATION AND CURETTAGE OF UTERUS    . DILATION AND EVACUATION N/A 10/04/2013   Procedure: DILATATION AND EVACUATION;  Surgeon: Janyth Contes, MD;  Location: [facility];  Service: Gynecology;  Laterality: N/A;  [place] in room   . TONSILLECTOMY    . TUBAL LIGATION      Family History  Problem Relation Age of Onset  . Hearing loss Mother   . Asthma Mother   . Asthma Father   . Diabetes Son   . Cancer Maternal Grandfather        bone  . Liver disease Maternal Grandfather   . Asthma Sister   . Lupus Sister     Social History   Tobacco Use  . Smoking status: Current Some Day Smoker    Packs/day: 0.25    Years: 15.00    Pack years: 3.75    Types: Cigarettes  . Smokeless tobacco: Never Used  Substance Use Topics  . Alcohol use: Not Currently    Alcohol/week: 4.0 standard drinks    Types: 4 Cans of beer per week  . Drug use: No     Allergies  Allergen Reactions  . Penicillins Nausea And Vomiting and Other (See Comments)  UNSPECIFIED REACTION  Has patient had a PCN reaction causing immediate rash, facial/tongue/throat swelling, SOB or lightheadedness with hypotension:No Has patient had a PCN reaction causing severe rash involving mucus membranes or skin necrosis:No Has patient had a PCN reaction that REACTION THAT REQUIRED HOSPITALIZATION:  #  #  #  YES  #  #  #  Has patient had a PCN reaction occurring within the last 10 years: #  #  #  YES  #  #  #   . Norvasc [Amlodipine Besylate] Other (See Comments)    Hypersensitivity? Tip of tongue numb and flushing.       Observations/Objective: No vital signs or physical exam conducted as visit was done via telephone  Assessment and Plan: 1. Essential hypertension Refill provided of patient's hydrochlorothiazide 25 mg daily  and metoprolol 25 mg twice daily for continued control of blood pressure.  Patient will be scheduled for blood pressure recheck follow-up with the clinical pharmacist.  We will recheck basic metabolic panel as on review of chart, patient has had mild decrease in potassium in the past/hypokalemia and is current complaint of dizziness. - hydrochlorothiazide (HYDRODIURIL) 25 MG tablet; Take 1 tablet (25 mg total) by mouth daily.  Dispense: 90 tablet; Refill: 1 - metoprolol tartrate (LOPRESSOR) 25 MG tablet; Take 1 tablet (25 mg total) by mouth 2 (two) times daily.  Dispense: 180 tablet; Refill: 1 - Basic Metabolic Panel; Future  2. Dizziness Patient with complaint of recent onset of dizziness but denies any head congestion, postnasal drainage, or ear pain.  Will check CBC to look for anemia and basic metabolic panel to look for signs of dehydration, low potassium, elevated blood sugar or any other abnormalities which may be contributing to her dizziness - CBC; Future - Basic Metabolic Panel; Future  3. Fatigue, unspecified type She reports a recent increase in fatigue and she will have a basic metabolic panel, TSH, vitamin D level and CBC in follow-up of fatigue to look for electrolyte abnormality, thyroid disorder, vitamin D deficiency or anemia/blood disorder as a possible cause of her fatigue. - Basic Metabolic Panel; Future - TSH; Future - Vitamin D, 25-hydroxy; Future -CBC; Future  4. Encounter for long-term (current) use of medications She will have basic metabolic panel in follow-up of long-term use of medication for treatment of hypertension and patient is on other medications which may increase blood sugar/glucose (Zyprexa) and her blood pressure medication may increase risk of dehydration/low potassium. - Basic Metabolic Panel; Future  Follow Up Instructions:Return in about 6 months (around 02/16/2020) for HTN: 1-2 week f/u with CPP for BP check and schedule lab visit.    I discussed  the assessment and treatment plan with the patient. The patient was provided an opportunity to ask questions and all were answered. The patient agreed with the plan and demonstrated an understanding of the instructions.   The patient was advised to call back or seek an in-person evaluation if the symptoms worsen or if the condition fails to improve as anticipated.  I provided 9 minutes of non-face-to-face time during this encounter.   Cain Saupe, MD

## 2019-08-16 NOTE — Telephone Encounter (Signed)
Patient scheduled for BP check.

## 2019-08-16 NOTE — Progress Notes (Signed)
Med refill

## 2019-08-19 ENCOUNTER — Other Ambulatory Visit: Payer: Self-pay

## 2019-08-19 ENCOUNTER — Ambulatory Visit: Payer: Medicaid Other | Attending: Family Medicine | Admitting: Pharmacist

## 2019-08-19 DIAGNOSIS — R5383 Other fatigue: Secondary | ICD-10-CM | POA: Diagnosis not present

## 2019-08-19 DIAGNOSIS — Z9111 Patient's noncompliance with dietary regimen: Secondary | ICD-10-CM | POA: Insufficient documentation

## 2019-08-19 DIAGNOSIS — R42 Dizziness and giddiness: Secondary | ICD-10-CM | POA: Diagnosis not present

## 2019-08-19 DIAGNOSIS — I10 Essential (primary) hypertension: Secondary | ICD-10-CM

## 2019-08-19 DIAGNOSIS — Z79899 Other long term (current) drug therapy: Secondary | ICD-10-CM

## 2019-08-19 DIAGNOSIS — F17200 Nicotine dependence, unspecified, uncomplicated: Secondary | ICD-10-CM | POA: Insufficient documentation

## 2019-08-19 NOTE — Progress Notes (Signed)
   S:    Patient arrives in good spirits.    Presents to the clinic for hypertension evaluation, counseling, and management.  Patient was referred and last seen by Primary Care Provider on 08/16/19.   Patient reports adherence with medications.  Current BP Medications include:  HCTZ 25 mg daily, metoprolol 25 mg BID  Dietary habits include: admits to noncompliance with salt restriction; denies drinking excess caffeine.  Exercise habits include: denies  Family / Social history:  - Fhx: asthma, DM - Tobacco: current every day smoker - Alcohol: reports occasional use   O:  Vitals:   08/19/19 1420  BP: (!) 146/98  Pulse: 96   Home BP readings:  None   Last 3 Office BP readings: BP Readings from Last 3 Encounters:  08/19/19 (!) 146/98  07/15/19 (!) 138/110  03/24/19 128/86   BMET    Component Value Date/Time   NA 137 03/15/2019 1437   NA 139 02/05/2018 1104   K 3.4 (L) 03/15/2019 1437   CL 98 03/15/2019 1437   CO2 29 03/15/2019 1437   GLUCOSE 92 03/15/2019 1437   BUN 6 03/15/2019 1437   BUN 12 02/05/2018 1104   CREATININE 0.88 03/15/2019 1437   CREATININE 1.09 10/31/2015 1506   CALCIUM 9.5 03/15/2019 1437   GFRNONAA >60 03/15/2019 1437   GFRNONAA 67 10/31/2015 1506   GFRAA >60 03/15/2019 1437   GFRAA 78 10/31/2015 1506   Renal function: CrCl cannot be calculated (Patient's most recent lab result is older than the maximum 21 days allowed.).  Clinical ASCVD: Yes  The ASCVD Risk score Denman George DC Jr., et al., 2013) failed to calculate for the following reasons:   The 2013 ASCVD risk score is only valid for ages 74 to 59  A/P: Hypertension longstanding uncontrolled on current medications. BP Goal = <130/80 mmHg. Patient endorses medication compliance. Will get labs today and have patient follow-up next week for therapy modification.   -Continued current medications for now.  -F/u labs ordered - future labs already entered by PCP. -Counseled on lifestyle modifications  for blood pressure control including reduced dietary sodium, increased exercise, adequate sleep  Results reviewed and written information provided.   Total time in face-to-face counseling 15 minutes.   F/U Clinic Visit in 1-2 weeks.  Butch Penny, PharmD, CPP Clinical Pharmacist [facility] [phone]

## 2019-08-20 ENCOUNTER — Other Ambulatory Visit: Payer: Self-pay | Admitting: Family Medicine

## 2019-08-20 DIAGNOSIS — E559 Vitamin D deficiency, unspecified: Secondary | ICD-10-CM

## 2019-08-20 LAB — CBC
Hematocrit: 48.6 % — ABNORMAL HIGH (ref 34.0–46.6)
Hemoglobin: 16.1 g/dL — ABNORMAL HIGH (ref 11.1–15.9)
MCH: 29.9 pg (ref 26.6–33.0)
MCHC: 33.1 g/dL (ref 31.5–35.7)
MCV: 90 fL (ref 79–97)
Platelets: 347 x10E3/uL (ref 150–450)
RBC: 5.39 x10E6/uL — ABNORMAL HIGH (ref 3.77–5.28)
RDW: 13.3 % (ref 11.7–15.4)
WBC: 8.7 x10E3/uL (ref 3.4–10.8)

## 2019-08-20 LAB — BASIC METABOLIC PANEL WITH GFR
BUN/Creatinine Ratio: 7 — ABNORMAL LOW (ref 9–23)
BUN: 7 mg/dL (ref 6–20)
CO2: 27 mmol/L (ref 20–29)
Calcium: 10 mg/dL (ref 8.7–10.2)
Chloride: 96 mmol/L (ref 96–106)
Creatinine, Ser: 1.01 mg/dL — ABNORMAL HIGH (ref 0.57–1.00)
GFR calc Af Amer: 83 mL/min/1.73
GFR calc non Af Amer: 72 mL/min/1.73
Glucose: 61 mg/dL — ABNORMAL LOW (ref 65–99)
Potassium: 4.3 mmol/L (ref 3.5–5.2)
Sodium: 137 mmol/L (ref 134–144)

## 2019-08-20 LAB — TSH: TSH: 1.1 u[IU]/mL (ref 0.450–4.500)

## 2019-08-20 LAB — VITAMIN D 25 HYDROXY (VIT D DEFICIENCY, FRACTURES): Vit D, 25-Hydroxy: 24.7 ng/mL — ABNORMAL LOW (ref 30.0–100.0)

## 2019-08-20 MED ORDER — VITAMIN D (ERGOCALCIFEROL) 1.25 MG (50000 UNIT) PO CAPS: 50000.0000 [IU] | ORAL_CAPSULE | ORAL | 0 refills | Status: DC

## 2019-08-23 MED FILL — VIT D2 1.25 MG (50,000 UNIT: 1.25 MG | 84 days supply | Qty: 12 | Fill #0

## 2019-08-26 ENCOUNTER — Ambulatory Visit: Payer: Medicaid Other | Admitting: Pharmacist

## 2019-08-30 ENCOUNTER — Other Ambulatory Visit: Payer: Self-pay

## 2019-08-30 ENCOUNTER — Ambulatory Visit: Payer: Medicaid Other | Attending: Family Medicine | Admitting: Pharmacist

## 2019-08-30 VITALS — BP 145/111 | HR 93

## 2019-08-30 DIAGNOSIS — Z888 Allergy status to other drugs, medicaments and biological substances status: Secondary | ICD-10-CM | POA: Insufficient documentation

## 2019-08-30 DIAGNOSIS — Z79899 Other long term (current) drug therapy: Secondary | ICD-10-CM | POA: Insufficient documentation

## 2019-08-30 DIAGNOSIS — Z833 Family history of diabetes mellitus: Secondary | ICD-10-CM | POA: Diagnosis not present

## 2019-08-30 DIAGNOSIS — F17210 Nicotine dependence, cigarettes, uncomplicated: Secondary | ICD-10-CM | POA: Insufficient documentation

## 2019-08-30 DIAGNOSIS — Z825 Family history of asthma and other chronic lower respiratory diseases: Secondary | ICD-10-CM | POA: Insufficient documentation

## 2019-08-30 DIAGNOSIS — I10 Essential (primary) hypertension: Secondary | ICD-10-CM | POA: Diagnosis present

## 2019-08-30 MED ORDER — LOSARTAN POTASSIUM 50 MG PO TABS: 50.0000 mg | ORAL_TABLET | Freq: Every day | ORAL | 1 refills | Status: DC

## 2019-08-30 MED FILL — LOSARTAN POTASSIUM 50 MG TA: 50 | 30 days supply | Qty: 30 | Fill #0

## 2019-08-30 NOTE — Progress Notes (Signed)
   S:    Patient arrives in good spirits. Presents to the clinic for hypertension evaluation, counseling, and management.  Patient was referred and last seen by Primary Care Provider on 08/16/19. I saw her 08/19/19 - BP at that visit 146/98  Patient reports adherence with medications.  Current BP Medications include:  HCTZ 25 mg daily, metoprolol 25 mg BID  Dietary habits include: admits to non-compliance with salt restriction; denies drinking excess caffeine.  Exercise habits include: denies  Family / Social history:  - Fhx: asthma, DM - Tobacco: current every day smoker - Alcohol: reports occasional use   O:  Vitals:   08/30/19 1451  BP: (!) 145/111  Pulse: 93   Home BP readings:  None   Last 3 Office BP readings: BP Readings from Last 3 Encounters:  08/30/19 (!) 145/111  08/19/19 (!) 146/98  07/15/19 (!) 138/110   BMET    Component Value Date/Time   NA 137 08/19/2019 1426   K 4.3 08/19/2019 1426   CL 96 08/19/2019 1426   CO2 27 08/19/2019 1426   GLUCOSE 61 (L) 08/19/2019 1426   GLUCOSE 92 03/15/2019 1437   BUN 7 08/19/2019 1426   CREATININE 1.01 (H) 08/19/2019 1426   CREATININE 1.09 10/31/2015 1506   CALCIUM 10.0 08/19/2019 1426   GFRNONAA 72 08/19/2019 1426   GFRNONAA 67 10/31/2015 1506   GFRAA 83 08/19/2019 1426   GFRAA 78 10/31/2015 1506   Renal function: CrCl cannot be calculated (Unknown ideal weight.).  Clinical ASCVD: Yes  The ASCVD Risk score Denman George DC Jr., et al., 2013) failed to calculate for the following reasons:   The 2013 ASCVD risk score is only valid for ages 69 to 21  A/P: Hypertension longstanding uncontrolled on current medications. BP Goal = <130/80 mmHg. Patient endorses medication compliance. Kidney function and electrolytes from 08/19/19 look okay. Patient with allergy to amlodipine in the past. Will start losartan today. -Continue metoprolol and HCTZ. -Started losartan 50 mg daily.  -Counseled on lifestyle modifications for blood  pressure control including reduced dietary sodium, increased exercise, adequate sleep -Recommend f/u in 1 month for recheck and labs.  Results reviewed and written information provided. Total time in face-to-face counseling 15 minutes.   F/U Clinic Visit in 1 month.  Butch Penny, PharmD, CPP Clinical Pharmacist [facility] [phone]

## 2019-08-30 NOTE — Patient Instructions (Signed)
Thank you for coming to see [name] today.   Blood pressure today is elevated.  Continue taking hydrochlorothiazide and metoprolol.   Start taking losartan 50 mg daily. You will need to take 1 tablet daily.   Limiting salt and caffeine, as well as exercising as able for at least 30 minutes for 5 days out of the week, can also help you lower your blood pressure.  Take your blood pressure at home if you are able. Please write down these numbers and bring them to your visits.  If you have any questions about medications, please call me [phone].  [name]

## 2019-08-31 ENCOUNTER — Encounter: Payer: Self-pay | Admitting: Pharmacist

## 2019-09-19 ENCOUNTER — Emergency Department (HOSPITAL_COMMUNITY)
Admission: EM | Admit: 2019-09-19 | Discharge: 2019-09-19 | Disposition: A | Discharge status: Home / Self Care | Payer: Medicaid Other | Attending: Emergency Medicine | Admitting: Emergency Medicine

## 2019-09-19 ENCOUNTER — Other Ambulatory Visit: Payer: Self-pay

## 2019-09-19 ENCOUNTER — Encounter (HOSPITAL_COMMUNITY): Payer: Self-pay | Admitting: Emergency Medicine

## 2019-09-19 DIAGNOSIS — F17210 Nicotine dependence, cigarettes, uncomplicated: Secondary | ICD-10-CM | POA: Insufficient documentation

## 2019-09-19 DIAGNOSIS — Y929 Unspecified place or not applicable: Secondary | ICD-10-CM | POA: Insufficient documentation

## 2019-09-19 DIAGNOSIS — Z79899 Other long term (current) drug therapy: Secondary | ICD-10-CM | POA: Diagnosis not present

## 2019-09-19 DIAGNOSIS — J45909 Unspecified asthma, uncomplicated: Secondary | ICD-10-CM | POA: Diagnosis not present

## 2019-09-19 DIAGNOSIS — X509XXA Other and unspecified overexertion or strenuous movements or postures, initial encounter: Secondary | ICD-10-CM | POA: Diagnosis not present

## 2019-09-19 DIAGNOSIS — Y999 Unspecified external cause status: Secondary | ICD-10-CM | POA: Diagnosis not present

## 2019-09-19 DIAGNOSIS — I10 Essential (primary) hypertension: Secondary | ICD-10-CM | POA: Insufficient documentation

## 2019-09-19 DIAGNOSIS — Y9389 Activity, other specified: Secondary | ICD-10-CM | POA: Insufficient documentation

## 2019-09-19 DIAGNOSIS — S39012A Strain of muscle, fascia and tendon of lower back, initial encounter: Secondary | ICD-10-CM | POA: Diagnosis not present

## 2019-09-19 DIAGNOSIS — S3992XA Unspecified injury of lower back, initial encounter: Secondary | ICD-10-CM | POA: Diagnosis present

## 2019-09-19 MED ORDER — KETOROLAC TROMETHAMINE 30 MG/ML IJ SOLN
15.0000 mg | Freq: Once | INTRAMUSCULAR | Status: AC
  Administered 2019-09-19: 15 mg via INTRAMUSCULAR
  Filled 2019-09-19: qty 1

## 2019-09-19 MED ORDER — OXYCODONE HCL 5 MG PO TABS
5.0000 mg | ORAL_TABLET | Freq: Once | ORAL | Status: AC
  Administered 2019-09-19: 5 mg via ORAL
  Filled 2019-09-19: qty 1

## 2019-09-19 MED ORDER — ACETAMINOPHEN 500 MG PO TABS
1000.0000 mg | ORAL_TABLET | Freq: Once | ORAL | Status: AC
  Administered 2019-09-19: 1000 mg via ORAL
  Filled 2019-09-19: qty 2

## 2019-09-19 MED ORDER — DIAZEPAM 5 MG PO TABS
5.0000 mg | ORAL_TABLET | Freq: Once | ORAL | Status: AC
  Administered 2019-09-19: 5 mg via ORAL
  Filled 2019-09-19: qty 1

## 2019-09-19 NOTE — ED Triage Notes (Signed)
Pt reports that she bent over about 30 minutes ago and when standing back up having lower back pains. Denies falls or injuries.

## 2019-09-19 NOTE — ED Notes (Signed)
Pt verbalizes understanding of DC instructions. Pt belongings returned and is ambulatory out of ED.    Pt confirmed has a ride home

## 2019-09-19 NOTE — ED Provider Notes (Signed)
[facility]   CSN: [id number] Arrival date & time: 09/19/19  1552     History Chief Complaint  Patient presents with  . Back Pain    [name] is a 39 y.o. female.  39 yo F with chief complaints of low back pain.  Patient states that she bent down to pick something up and when she stood back up she had pain to her low back.  States is on both sides.  Going on for about an hour.  Worse with movement palpation and twisting.  Denies trauma denies loss of bowel or bladder denies loss of rectal sensation denies fever.  The history is provided by the patient.  Back Pain Location:  Lumbar spine Quality:  Stabbing and shooting Radiates to:  Does not radiate Pain severity:  Moderate Onset quality:  Gradual Duration:  1 hour Timing:  Constant Progression:  Unchanged Chronicity:  New Relieved by:  Nothing Worsened by:  Palpation, bending and twisting Ineffective treatments:  None tried Associated symptoms: no chest pain, no dysuria, no fever and no headaches        Past Medical History:  Diagnosis Date  . Anxiety   . Asthma   . Depression    doing good, not on meds now  . GERD (gastroesophageal reflux disease)   . Headache    migraines  . History of kidney stones   . Hypertension   . Kidney stones   . Missed abortion 10/02/2013  . Ovarian cyst   . Pregnancy induced hypertension   . Suicidal intent   . Vaginal Pap smear, abnormal    bx, f/u ok    Patient Active Problem List   Diagnosis Date Noted  . 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 MDD (major depressive disorder), single episode, severe , no psychosis (HCC) 07/20/2017  . Acute sinusitis with symptoms > 10 days 05/29/2017  . Postpartum complication 06/13/2015  . Normal labor and delivery 06/06/2015  . Indication for care in labor or delivery 06/06/2015  . S/P cesarean section 06/06/2015  . Asthma,  chronic 07/29/2014  . Depression 07/29/2014  . Smoking 07/29/2014  . Essential hypertension, benign 07/29/2014  . Missed abortion 10/02/2013    Past Surgical History:  Procedure Laterality Date  . CESAREAN SECTION    . CESAREAN SECTION N/A 06/06/2015   Procedure: CESAREAN SECTION;  Surgeon: Kathreen Cosier, MD;  Location: [facility];  Service: Obstetrics;  Laterality: N/A;  . CHOLECYSTECTOMY N/A 08/23/2016   Procedure: LAPAROSCOPIC CHOLECYSTECTOMY;  Surgeon: Berna Bue, MD;  Location: [facility];  Service: General;  Laterality: N/A;  . DILATION AND CURETTAGE OF UTERUS    . DILATION AND EVACUATION N/A 10/04/2013   Procedure: DILATATION AND EVACUATION;  Surgeon: Sherian Rein, MD;  Location: [facility];  Service: Gynecology;  Laterality: N/A;  [place] in room   . TONSILLECTOMY    . TUBAL LIGATION       OB History    Gravida  4   Para  2   Term  2   Preterm  0   AB  2   Living  2     SAB  1   TAB  1   Ectopic  0   Multiple  0   Live Births  2           Family History  Problem Relation Age of Onset  . Hearing loss Mother   .  Asthma Mother   . Asthma Father   . Diabetes Son   . Cancer Maternal Grandfather        bone  . Liver disease Maternal Grandfather   . Asthma Sister   . Lupus Sister     Social History   Tobacco Use  . Smoking status: Current Some Day Smoker    Packs/day: 0.25    Years: 15.00    Pack years: 3.75    Types: Cigarettes  . Smokeless tobacco: Never Used  Substance Use Topics  . Alcohol use: Not Currently    Alcohol/week: 4.0 standard drinks    Types: 4 Cans of beer per week  . Drug use: No    Home Medications Prior to Admission medications   Medication Sig Start Date End Date Taking? Authorizing Provider  albuterol (PROVENTIL HFA;VENTOLIN HFA) 108 (90 Base) MCG/ACT inhaler Inhale 1-2 puffs into the lungs every 6 (six) hours as needed for wheezing or shortness of breath. 02/05/18   Anders Simmonds, PA-C  albuterol (PROVENTIL)  (2.5 MG/3ML) 0.083% nebulizer solution Take 3 mLs (2.5 mg total) by nebulization every 6 (six) hours as needed for wheezing or shortness of breath. 03/24/19   Charlott Holler, MD  atomoxetine (STRATTERA) 40 MG capsule Take 40 mg by mouth daily.    [provider]  Fluticasone-Salmeterol (ADVAIR) 250-50 MCG/DOSE AEPB Inhale 1 puff into the lungs every 12 (twelve) hours. 07/15/19   Charlott Holler, MD  hydrochlorothiazide (HYDRODIURIL) 25 MG tablet Take 1 tablet (25 mg total) by mouth daily. 08/16/19   Fulp, Cammie, MD  losartan (COZAAR) 50 MG tablet Take 1 tablet (50 mg total) by mouth daily. 08/30/19   Fulp, Cammie, MD  metoprolol tartrate (LOPRESSOR) 25 MG tablet Take 1 tablet (25 mg total) by mouth 2 (two) times daily. 08/16/19   Fulp, Cammie, MD  Multiple Vitamin (MULTIVITAMIN WITH MINERALS) TABS tablet Take 1 tablet by mouth daily.    [provider]  OLANZapine (ZYPREXA PO) Take 1 tablet by mouth daily.    [provider]  pantoprazole (PROTONIX) 40 MG tablet Take 1 tablet (40 mg total) by mouth daily. 07/15/19   Charlott Holler, MD  traZODone (DESYREL) 50 MG tablet Take 1 tablet (50 mg total) by mouth at bedtime as needed for sleep. Patient taking differently: Take 100 mg by mouth at bedtime as needed for sleep.  07/23/17   Money, Gerlene Burdock, FNP  varenicline (CHANTIX CONTINUING MONTH PAK) 1 MG tablet Take 1 tablet (1 mg total) by mouth 2 (two) times daily. 07/15/19   Charlott Holler, MD  varenicline (CHANTIX PAK) 0.5 MG X 11 & 1 MG X 42 tablet Take one 0.5 mg tablet by mouth once daily for 3 days, then increase to one 0.5 mg tablet twice daily for 4 days, then increase to one 1 mg tablet twice daily. 07/15/19   Charlott Holler, MD  Vitamin D, Ergocalciferol, (DRISDOL) 1.25 MG (50000 UNIT) CAPS capsule Take 1 capsule (50,000 Units total) by mouth every 7 (seven) days. Once per week 08/20/19   Cain Saupe, MD    Allergies    Penicillins and Norvasc [amlodipine besylate]  Review  of Systems   Review of Systems  Constitutional: Negative for chills and fever.  HENT: Negative for congestion and rhinorrhea.   Eyes: Negative for redness and visual disturbance.  Respiratory: Negative for shortness of breath and wheezing.   Cardiovascular: Negative for chest pain and palpitations.  Gastrointestinal: Negative for nausea  and vomiting.  Genitourinary: Negative for dysuria and urgency.  Musculoskeletal: Positive for back pain. Negative for arthralgias and myalgias.  Skin: Negative for pallor and wound.  Neurological: Negative for dizziness and headaches.    Physical Exam Updated Vital Signs BP (!) 134/103 (BP Location: Left Arm)   Pulse (!) 104   Temp 98.3 F (36.8 C) (Oral)   LMP 09/05/2019   SpO2 94%   Physical Exam Vitals and nursing note reviewed.  Constitutional:      General: She is not in acute distress.    Appearance: She is well-developed. She is not diaphoretic.  HENT:     Head: Normocephalic and atraumatic.  Eyes:     Pupils: Pupils are equal, round, and reactive to light.  Cardiovascular:     Rate and Rhythm: Normal rate and regular rhythm.     Heart sounds: No murmur. No friction rub. No gallop.   Pulmonary:     Effort: Pulmonary effort is normal.     Breath sounds: No wheezing or rales.  Abdominal:     General: There is no distension.     Palpations: Abdomen is soft.     Tenderness: There is no abdominal tenderness.  Musculoskeletal:        General: Tenderness present.     Cervical back: Normal range of motion and neck supple.     Comments: Mild tenderness to the low back.  Negative straight raise leg test bilaterally.  Pulse motor and sensation intact bilaterally.  Reflexes are 2+ and equal.  No clonus  Skin:    General: Skin is warm and dry.  Neurological:     Mental Status: She is alert and oriented to person, place, and time.  Psychiatric:        Behavior: Behavior normal.     ED Results / Procedures / Treatments   Labs (all  labs ordered are listed, but only abnormal results are displayed) Labs Reviewed - No data to display  EKG None  Radiology No results found.  Procedures Procedures (including critical care time)  Medications Ordered in ED Medications  acetaminophen (TYLENOL) tablet 1,000 mg (1,000 mg Oral Given 09/19/19 1642)  ketorolac (TORADOL) 30 MG/ML injection 15 mg (15 mg Intramuscular Given 09/19/19 1642)  oxyCODONE (Oxy IR/ROXICODONE) immediate release tablet 5 mg (5 mg Oral Given 09/19/19 1642)  diazepam (VALIUM) tablet 5 mg (5 mg Oral Given 09/19/19 1643)    ED Course  I have reviewed the triage vital signs and the nursing notes.  Pertinent labs & imaging results that were available during my care of the patient were reviewed by me and considered in my medical decision making (see chart for details).    MDM Rules/Calculators/A&P                      39 yo F with a chief complaints of low back pain.  Likely musculoskeletal.  No red flags.  Treat pain here.  PCP follow-up.  5:18 PM:  I have discussed the diagnosis/risks/treatment options with the patient and believe the pt to be eligible for discharge home to follow-up with PCP. We also discussed returning to the ED immediately if new or worsening sx occur. We discussed the sx which are most concerning (e.g., sudden worsening pain, fever, inability to tolerate by mouth) that necessitate immediate return. Medications administered to the patient during their visit and any new prescriptions provided to the patient are listed below.  Medications given during this visit Medications  acetaminophen (TYLENOL) tablet 1,000 mg (1,000 mg Oral Given 09/19/19 1642)  ketorolac (TORADOL) 30 MG/ML injection 15 mg (15 mg Intramuscular Given 09/19/19 1642)  oxyCODONE (Oxy IR/ROXICODONE) immediate release tablet 5 mg (5 mg Oral Given 09/19/19 1642)  diazepam (VALIUM) tablet 5 mg (5 mg Oral Given 09/19/19 1643)     The patient appears reasonably screen and/or stabilized  for discharge and I doubt any other medical condition or other [facility] requiring further screening, evaluation, or treatment in the ED at this time prior to discharge.   Final Clinical Impression(s) / ED Diagnoses Final diagnoses:  Strain of lumbar region, initial encounter    Rx / DC Orders ED Discharge Orders    None       Deno Etienne, DO 09/19/19 1718

## 2019-09-19 NOTE — Discharge Instructions (Signed)

## 2019-09-29 ENCOUNTER — Encounter: Payer: Self-pay | Admitting: Pharmacist

## 2019-09-29 ENCOUNTER — Other Ambulatory Visit: Payer: Self-pay | Admitting: Family Medicine

## 2019-09-29 ENCOUNTER — Ambulatory Visit: Payer: Medicaid Other | Attending: Family Medicine | Admitting: Pharmacist

## 2019-09-29 ENCOUNTER — Other Ambulatory Visit: Payer: Self-pay

## 2019-09-29 VITALS — BP 132/94 | HR 98

## 2019-09-29 DIAGNOSIS — J45909 Unspecified asthma, uncomplicated: Secondary | ICD-10-CM

## 2019-09-29 DIAGNOSIS — F17210 Nicotine dependence, cigarettes, uncomplicated: Secondary | ICD-10-CM | POA: Diagnosis not present

## 2019-09-29 DIAGNOSIS — Z7901 Long term (current) use of anticoagulants: Secondary | ICD-10-CM | POA: Insufficient documentation

## 2019-09-29 DIAGNOSIS — I10 Essential (primary) hypertension: Secondary | ICD-10-CM | POA: Diagnosis present

## 2019-09-29 MED ORDER — ALBUTEROL SULFATE HFA 108 (90 BASE) MCG/ACT IN AERS: 1.0000 | INHALATION_SPRAY | Freq: Four times a day (QID) | RESPIRATORY_TRACT | 2 refills | Status: DC | PRN

## 2019-09-29 MED ORDER — LOSARTAN POTASSIUM-HCTZ 100-25 MG PO TABS: 1.0000 | ORAL_TABLET | Freq: Every day | ORAL | 2 refills | Status: DC

## 2019-09-29 NOTE — Progress Notes (Signed)
   S:    Patient arrives in good spirits. Presents to the clinic for hypertension evaluation, counseling, and management.  Patient was referred and last seen by Primary Care Provider on 08/16/19. I saw her 08/30/2019 and started losartan 50 mg daily.   Patient reports adherence with medications.  Current BP Medications include:  HCTZ 25 mg daily, losartan 50 mg daily, metoprolol 25 mg BID  Dietary habits include: admits to non-compliance with salt restriction; denies drinking excess caffeine.  Exercise habits include: denies  Family / Social history:  - Fhx: asthma, DM - Tobacco: current every day smoker - Alcohol: reports occasional use   O:  Vitals:   09/29/19 1446  BP: (!) 132/94  Pulse: 98   Home BP readings:   - Wrist cuff - did not bring; reports 140s/100s  Last 3 Office BP readings: BP Readings from Last 3 Encounters:  09/29/19 (!) 132/94  09/19/19 (!) 134/103  08/30/19 (!) 145/111   BMET    Component Value Date/Time   NA 144 09/29/2019 1452   K 4.2 09/29/2019 1452   CL 102 09/29/2019 1452   CO2 21 09/29/2019 1452   GLUCOSE 91 09/29/2019 1452   GLUCOSE 92 03/15/2019 1437   BUN 10 09/29/2019 1452   CREATININE 1.21 (H) 09/29/2019 1452   CREATININE 1.09 10/31/2015 1506   CALCIUM 9.6 09/29/2019 1452   GFRNONAA 58 (L) 09/29/2019 1452   GFRNONAA 67 10/31/2015 1506   GFRAA 67 09/29/2019 1452   GFRAA 78 10/31/2015 1506   Renal function: CrCl cannot be calculated (Unknown ideal weight.).  Clinical ASCVD: Yes  The ASCVD Risk score Denman George DC Jr., et al., 2013) failed to calculate for the following reasons:   The 2013 ASCVD risk score is only valid for ages 12 to 27  A/P: Hypertension longstanding uncontrolled on current medications. BP Goal = <130/80 mmHg. Patient endorses medication compliance. -Change losartan to losartan-HCTZ 100-25 mg daily.  -Continue metoprolol 25 mg BID. -BMP -Counseled on lifestyle modifications for blood pressure control including  reduced dietary sodium, increased exercise, adequate sleep  Results reviewed and written information provided. Total time in face-to-face counseling 15 minutes.   F/U Clinic Visit with PCP.  Butch Penny, PharmD, CPP Clinical Pharmacist [facility] [phone]

## 2019-09-30 LAB — BASIC METABOLIC PANEL WITH GFR
BUN/Creatinine Ratio: 8 — ABNORMAL LOW (ref 9–23)
BUN: 10 mg/dL (ref 6–20)
CO2: 21 mmol/L (ref 20–29)
Calcium: 9.6 mg/dL (ref 8.7–10.2)
Chloride: 102 mmol/L (ref 96–106)
Creatinine, Ser: 1.21 mg/dL — ABNORMAL HIGH (ref 0.57–1.00)
GFR calc Af Amer: 67 mL/min/1.73
GFR calc non Af Amer: 58 mL/min/1.73 — ABNORMAL LOW
Glucose: 91 mg/dL (ref 65–99)
Potassium: 4.2 mmol/L (ref 3.5–5.2)
Sodium: 144 mmol/L (ref 134–144)

## 2019-10-21 MED FILL — PROAIR HFA 90 MCG INHALER: 108 (90 BAS | 25 days supply | Qty: 9 | Fill #0

## 2019-10-21 MED FILL — LOSARTAN-HCTZ 100-25 MG TAB: 100-25 | 90 days supply | Qty: 90 | Fill #0

## 2019-11-01 ENCOUNTER — Other Ambulatory Visit: Payer: Self-pay

## 2019-11-01 ENCOUNTER — Emergency Department (HOSPITAL_COMMUNITY)
Admission: EM | Admit: 2019-11-01 | Discharge: 2019-11-01 | Discharge status: Against Medical Advice | Payer: Medicaid Other | Attending: Emergency Medicine | Admitting: Emergency Medicine

## 2019-11-01 DIAGNOSIS — Z0289 Encounter for other administrative examinations: Secondary | ICD-10-CM | POA: Insufficient documentation

## 2019-11-01 NOTE — ED Notes (Signed)
Labeled urine specimen cup and culture sent to lab. ENMiles 

## 2020-01-05 ENCOUNTER — Other Ambulatory Visit: Payer: Self-pay | Admitting: Family Medicine

## 2020-01-21 MED FILL — LOSARTAN-HCTZ 100-25 MG TAB: 100-25 | 90 days supply | Qty: 90 | Fill #0

## 2020-01-21 MED FILL — PROAIR HFA 90 MCG INHALER: 108 (90 BAS | 25 days supply | Qty: 9 | Fill #1

## 2020-02-17 ENCOUNTER — Emergency Department (HOSPITAL_COMMUNITY)
Admission: EM | Admit: 2020-02-17 | Discharge: 2020-02-17 | Disposition: A | Discharge status: Home / Self Care | Payer: Medicaid Other | Attending: Emergency Medicine | Admitting: Emergency Medicine

## 2020-02-17 ENCOUNTER — Telehealth: Payer: Medicaid Other | Admitting: Physician Assistant

## 2020-02-17 ENCOUNTER — Encounter (HOSPITAL_COMMUNITY): Payer: Self-pay

## 2020-02-17 ENCOUNTER — Emergency Department (HOSPITAL_COMMUNITY): Payer: Medicaid Other

## 2020-02-17 ENCOUNTER — Other Ambulatory Visit: Payer: Self-pay

## 2020-02-17 DIAGNOSIS — R945 Abnormal results of liver function studies: Secondary | ICD-10-CM | POA: Insufficient documentation

## 2020-02-17 DIAGNOSIS — J45909 Unspecified asthma, uncomplicated: Secondary | ICD-10-CM | POA: Insufficient documentation

## 2020-02-17 DIAGNOSIS — Z79899 Other long term (current) drug therapy: Secondary | ICD-10-CM | POA: Diagnosis not present

## 2020-02-17 DIAGNOSIS — R1084 Generalized abdominal pain: Secondary | ICD-10-CM | POA: Insufficient documentation

## 2020-02-17 DIAGNOSIS — F17210 Nicotine dependence, cigarettes, uncomplicated: Secondary | ICD-10-CM | POA: Diagnosis not present

## 2020-02-17 DIAGNOSIS — R1033 Periumbilical pain: Secondary | ICD-10-CM

## 2020-02-17 DIAGNOSIS — R110 Nausea: Secondary | ICD-10-CM | POA: Insufficient documentation

## 2020-02-17 DIAGNOSIS — I10 Essential (primary) hypertension: Secondary | ICD-10-CM | POA: Diagnosis not present

## 2020-02-17 DIAGNOSIS — R1013 Epigastric pain: Secondary | ICD-10-CM | POA: Diagnosis present

## 2020-02-17 DIAGNOSIS — R300 Dysuria: Secondary | ICD-10-CM | POA: Diagnosis not present

## 2020-02-17 DIAGNOSIS — Z7951 Long term (current) use of inhaled steroids: Secondary | ICD-10-CM | POA: Diagnosis not present

## 2020-02-17 DIAGNOSIS — R197 Diarrhea, unspecified: Secondary | ICD-10-CM

## 2020-02-17 DIAGNOSIS — R7989 Other specified abnormal findings of blood chemistry: Secondary | ICD-10-CM

## 2020-02-17 LAB — COMPREHENSIVE METABOLIC PANEL
ALT: 113 U/L — ABNORMAL HIGH (ref 0–44)
AST: 102 U/L — ABNORMAL HIGH (ref 15–41)
Albumin: 4.4 g/dL (ref 3.5–5.0)
Alkaline Phosphatase: 140 U/L — ABNORMAL HIGH (ref 38–126)
Anion gap: 11 (ref 5–15)
BUN: 9 mg/dL (ref 6–20)
CO2: 24 mmol/L (ref 22–32)
Calcium: 9.5 mg/dL (ref 8.9–10.3)
Chloride: 101 mmol/L (ref 98–111)
Creatinine, Ser: 1.05 mg/dL — ABNORMAL HIGH (ref 0.44–1.00)
GFR calc Af Amer: >60 mL/min (ref >60)
GFR calc non Af Amer: >60 mL/min (ref >60)
Glucose, Bld: 104 mg/dL — ABNORMAL HIGH (ref 70–99)
Potassium: 3.4 mmol/L — ABNORMAL LOW (ref 3.5–5.1)
Sodium: 136 mmol/L (ref 135–145)
Total Bilirubin: 0.6 mg/dL (ref 0.3–1.2)
Total Protein: 7.6 g/dL (ref 6.5–8.1)

## 2020-02-17 LAB — URINALYSIS, ROUTINE W REFLEX MICROSCOPIC
Bilirubin Urine: NEGATIVE
Glucose, UA: NEGATIVE mg/dL
Hgb urine dipstick: NEGATIVE
Ketones, ur: NEGATIVE mg/dL
Leukocytes,Ua: NEGATIVE
Nitrite: NEGATIVE
Protein, ur: 30 mg/dL — AB
Specific Gravity, Urine: 1.02 (ref 1.005–1.030)
pH: 6 (ref 5.0–8.0)

## 2020-02-17 LAB — CBC
HCT: 47.7 % — ABNORMAL HIGH (ref 36.0–46.0)
Hemoglobin: 15.8 g/dL — ABNORMAL HIGH (ref 12.0–15.0)
MCH: 28.9 pg (ref 26.0–34.0)
MCHC: 33.1 g/dL (ref 30.0–36.0)
MCV: 87.2 fL (ref 80.0–100.0)
Platelets: 379 10*3/uL (ref 150–400)
RBC: 5.47 MIL/uL — ABNORMAL HIGH (ref 3.87–5.11)
RDW: 14.1 % (ref 11.5–15.5)
WBC: 8.3 10*3/uL (ref 4.0–10.5)
nRBC: 0 % (ref 0.0–0.2)

## 2020-02-17 LAB — ACETAMINOPHEN LEVEL: Acetaminophen (Tylenol), Serum: <10 ug/mL — ABNORMAL LOW (ref 10–30)

## 2020-02-17 LAB — LIPASE, BLOOD: Lipase: 33 U/L (ref 11–51)

## 2020-02-17 LAB — I-STAT BETA HCG BLOOD, ED (MC, WL, AP ONLY): I-stat hCG, quantitative: <5 m[IU]/mL (ref <5)

## 2020-02-17 LAB — SALICYLATE LEVEL: Salicylate Lvl: <7 mg/dL — ABNORMAL LOW (ref 7.0–30.0)

## 2020-02-17 MED ORDER — SODIUM CHLORIDE 0.9 % IV BOLUS
1000.0000 mL | Freq: Once | INTRAVENOUS | Status: AC
  Administered 2020-02-17: 1000 mL via INTRAVENOUS

## 2020-02-17 MED ORDER — MORPHINE SULFATE (PF) 4 MG/ML IV SOLN
4.0000 mg | Freq: Once | INTRAVENOUS | Status: AC
  Administered 2020-02-17: 4 mg via INTRAVENOUS
  Filled 2020-02-17: qty 1

## 2020-02-17 MED ORDER — ONDANSETRON HCL 4 MG/2ML IJ SOLN
4.0000 mg | Freq: Once | INTRAMUSCULAR | Status: AC
  Administered 2020-02-17: 4 mg via INTRAVENOUS
  Filled 2020-02-17: qty 2

## 2020-02-17 MED ORDER — IOHEXOL 300 MG/ML  SOLN
100.0000 mL | Freq: Once | INTRAMUSCULAR | Status: AC | PRN
  Administered 2020-02-17: 100 mL via INTRAVENOUS

## 2020-02-17 NOTE — Progress Notes (Signed)
Hi [name],   I would feel more comfortable if you were seen by a medical provider for a physical exam and possibly labs/imaging.  There is a possibility your appendix is infected.   Based on what you shared with me, I feel your condition warrants further evaluation and I recommend that you be seen for a face to face office visit.   NOTE: If you entered your credit card information for this eVisit, you will not be charged. You may see a "hold" on your card for the $35 but that hold will drop off and you will not have a charge processed.   If you are having a true medical emergency please call 911.      For an urgent face to face visit, [place] has five urgent care centers for your convenience:      NEW:  [facility] at [facility] Directions [phone] [address] [place], [place] [address] . 10 am - 6pm Monday - Friday    [facility]) Get Driving Directions [phone] [address] [place], [place] [address] . 10 am to 8 pm Monday-Friday . 12 pm to 8 pm [facility] Urgent Care at [facility] Get Driving Directions [phone] [address] [place], [place] [address] . 8 am to 8 pm Monday-Friday . 9 am to 6 pm Saturday . 11 am to 6 pm Sunday     [facility] at [facility] Get Driving Directions  [phone] [address] [place], [place] [address] . 8 am to 8 pm Monday-Friday . 8 am to 4 pm [facility] Urgent Care at [facility] Directions [phone] [address] [place], [place] [address] . 12 pm to 6 pm Monday-Friday      Your e-visit answers were reviewed by a board certified advanced clinical practitioner to complete your personal care plan.  Thank you for using e-Visits.

## 2020-02-17 NOTE — ED Provider Notes (Signed)
[facility] DEPT Provider Note   CSN: [id number] Arrival date & time: 02/17/20  1307     History Chief Complaint  Patient presents with  . Abdominal Pain  . Diarrhea    [name] is a 39 y.o. female.  She is here with a complaint of right mid abdominal pain radiating to epigastric area associated with loose stools and nausea for the last 3 days.  Diarrhea seems to be improving.  Nonbloody.  Nausea with no vomiting.  No fevers or chills.  Also having some urinary symptoms.  History of cholecystectomy.  The history is provided by the patient.  Abdominal Pain Pain location: right mid abd. Pain quality: aching   Pain radiates to:  Epigastric region Pain severity:  Moderate Onset quality:  Gradual Timing:  Constant Progression:  Worsening Chronicity:  New Context: not trauma   Relieved by:  Nothing Worsened by:  Nothing Ineffective treatments:  None tried Associated symptoms: diarrhea, dysuria and nausea   Associated symptoms: no chest pain, no chills, no cough, no fever, no hematemesis, no hematochezia, no hematuria, no shortness of breath, no sore throat and no vomiting   Diarrhea Associated symptoms: abdominal pain   Associated symptoms: no chills, no fever, no headaches and no vomiting        Past Medical History:  Diagnosis Date  . Anxiety   . Asthma   . Depression    doing good, not on meds now  . GERD (gastroesophageal reflux disease)   . Headache    migraines  . History of kidney stones   . Hypertension   . Kidney stones   . Missed abortion 10/02/2013  . Ovarian cyst   . Pregnancy induced hypertension   . Suicidal intent   . Vaginal Pap smear, abnormal    bx, f/u ok    Patient Active Problem List   Diagnosis Date Noted  . MDD (major depressive disorder), recurrent severe, without psychosis ([place]) 07/23/2017  . MDD (major depressive disorder), single episode, severe , no psychosis ([place]) 07/20/2017  . Acute  sinusitis with symptoms > 10 days 05/29/2017  . Postpartum complication 41/28/7867  . Normal labor and delivery 06/06/2015  . Indication for care in labor or delivery 06/06/2015  . S/P cesarean section 06/06/2015  . Asthma, chronic 07/29/2014  . Depression 07/29/2014  . Smoking 07/29/2014  . Essential hypertension, benign 07/29/2014  . Missed abortion 10/02/2013    Past Surgical History:  Procedure Laterality Date  . CESAREAN SECTION    . CESAREAN SECTION N/A 06/06/2015   Procedure: CESAREAN SECTION;  Surgeon: Frederico Hamman, MD;  Location: [facility];  Service: Obstetrics;  Laterality: N/A;  . CHOLECYSTECTOMY N/A 08/23/2016   Procedure: LAPAROSCOPIC CHOLECYSTECTOMY;  Surgeon: Clovis Riley, MD;  Location: [facility];  Service: General;  Laterality: N/A;  . DILATION AND CURETTAGE OF UTERUS    . DILATION AND EVACUATION N/A 10/04/2013   Procedure: DILATATION AND EVACUATION;  Surgeon: Janyth Contes, MD;  Location: [facility];  Service: Gynecology;  Laterality: N/A;  [place] in room   . TONSILLECTOMY    . TUBAL LIGATION       OB History    Gravida  4   Para  2   Term  2   Preterm  0   AB  2   Living  2     SAB  1   TAB  1   Ectopic  0   Multiple  0   Live  Births  2           Family History  Problem Relation Age of Onset  . Hearing loss Mother   . Asthma Mother   . Asthma Father   . Diabetes Son   . Cancer Maternal Grandfather        bone  . Liver disease Maternal Grandfather   . Asthma Sister   . Lupus Sister     Social History   Tobacco Use  . Smoking status: Current Some Day Smoker    Packs/day: 0.25    Years: 15.00    Pack years: 3.75    Types: Cigarettes  . Smokeless tobacco: Never Used  Vaping Use  . Vaping Use: Never used  Substance Use Topics  . Alcohol use: Yes    Alcohol/week: 4.0 standard drinks    Types: 4 Cans of beer per week  . Drug use: No    Home Medications Prior to Admission medications   Medication Sig Start Date End Date  Taking? Authorizing Provider  albuterol (PROVENTIL) (2.5 MG/3ML) 0.083% nebulizer solution Take 3 mLs (2.5 mg total) by nebulization every 6 (six) hours as needed for wheezing or shortness of breath. 03/24/19   Spero Geralds, MD  albuterol (VENTOLIN HFA) 108 (90 Base) MCG/ACT inhaler Inhale 1-2 puffs into the lungs every 6 (six) hours as needed for wheezing or shortness of breath. 09/29/19   Fulp, Cammie, MD  atomoxetine (STRATTERA) 40 MG capsule Take 40 mg by mouth daily.    [provider]  Fluticasone-Salmeterol (ADVAIR) 250-50 MCG/DOSE AEPB Inhale 1 puff into the lungs every 12 (twelve) hours. 07/15/19   Spero Geralds, MD  losartan-hydrochlorothiazide (HYZAAR) 100-25 MG tablet TAKE 1 TABLET BY MOUTH DAILY. 01/05/20   Fulp, Cammie, MD  metoprolol tartrate (LOPRESSOR) 25 MG tablet Take 1 tablet (25 mg total) by mouth 2 (two) times daily. 08/16/19   Fulp, Cammie, MD  Multiple Vitamin (MULTIVITAMIN WITH MINERALS) TABS tablet Take 1 tablet by mouth daily.    [provider]  OLANZapine (ZYPREXA PO) Take 1 tablet by mouth daily.    [provider]  pantoprazole (PROTONIX) 40 MG tablet Take 1 tablet (40 mg total) by mouth daily. 07/15/19   Spero Geralds, MD  traZODone (DESYREL) 50 MG tablet Take 1 tablet (50 mg total) by mouth at bedtime as needed for sleep. Patient taking differently: Take 100 mg by mouth at bedtime as needed for sleep.  07/23/17   Money, Lowry Ram, FNP  varenicline (CHANTIX CONTINUING MONTH PAK) 1 MG tablet Take 1 tablet (1 mg total) by mouth 2 (two) times daily. 07/15/19   Spero Geralds, MD  varenicline (CHANTIX PAK) 0.5 MG X 11 & 1 MG X 42 tablet Take one 0.5 mg tablet by mouth once daily for 3 days, then increase to one 0.5 mg tablet twice daily for 4 days, then increase to one 1 mg tablet twice daily. 07/15/19   Spero Geralds, MD  Vitamin D, Ergocalciferol, (DRISDOL) 1.25 MG (50000 UNIT) CAPS capsule Take 1 capsule (50,000 Units total) by mouth every 7  (seven) days. Once per week 08/20/19   Antony Blackbird, MD    Allergies    Penicillins and Norvasc [amlodipine besylate]  Review of Systems   Review of Systems  Constitutional: Negative for chills and fever.  HENT: Negative for sore throat.   Eyes: Negative for visual disturbance.  Respiratory: Negative for cough and shortness of breath.   Cardiovascular: Negative for chest  pain.  Gastrointestinal: Positive for abdominal pain, diarrhea and nausea. Negative for hematemesis, hematochezia and vomiting.  Genitourinary: Positive for dysuria. Negative for hematuria.  Musculoskeletal: Negative for back pain.  Skin: Negative for rash.  Neurological: Negative for headaches.    Physical Exam Updated Vital Signs BP (!) 130/92 (BP Location: Left Arm)   Pulse 88   Temp 97.7 F (36.5 C) (Oral)   Resp 16   Ht 5' 4" (1.626 m)   Wt 78.5 kg   LMP 02/03/2020 (Approximate)   SpO2 97%   BMI 29.70 kg/m   Physical Exam Vitals and nursing note reviewed.  Constitutional:      General: She is not in acute distress.    Appearance: Normal appearance. She is well-developed.  HENT:     Head: Normocephalic and atraumatic.  Eyes:     Conjunctiva/sclera: Conjunctivae normal.  Cardiovascular:     Rate and Rhythm: Normal rate and regular rhythm.     Pulses: Normal pulses.     Heart sounds: No murmur heard.   Pulmonary:     Effort: Pulmonary effort is normal. No respiratory distress.     Breath sounds: Normal breath sounds.  Abdominal:     Palpations: Abdomen is soft.     Tenderness: There is abdominal tenderness in the epigastric area. There is no guarding or rebound.  Musculoskeletal:        General: No deformity or signs of injury. Normal range of motion.     Cervical back: Neck supple.  Skin:    General: Skin is warm and dry.     Capillary Refill: Capillary refill takes less than 2 seconds.  Neurological:     General: No focal deficit present.     Mental Status: She is alert.     Gait: Gait  normal.     ED Results / Procedures / Treatments   Labs (all labs ordered are listed, but only abnormal results are displayed) Labs Reviewed  COMPREHENSIVE METABOLIC PANEL - Abnormal; Notable for the following components:      Result Value   Potassium 3.4 (*)    Glucose, Bld 104 (*)    Creatinine, Ser 1.05 (*)    AST 102 (*)    ALT 113 (*)    Alkaline Phosphatase 140 (*)    All other components within normal limits  CBC - Abnormal; Notable for the following components:   RBC 5.47 (*)    Hemoglobin 15.8 (*)    HCT 47.7 (*)    All other components within normal limits  LIPASE, BLOOD  URINALYSIS, ROUTINE W REFLEX MICROSCOPIC  HEPATITIS PANEL, ACUTE  ACETAMINOPHEN LEVEL  SALICYLATE LEVEL  I-STAT BETA HCG BLOOD, ED ([facility], [facility], [facility] ONLY)    EKG None  Radiology CT Abdomen Pelvis W Contrast  Result Date: 02/17/2020 CLINICAL DATA:  Right lower quadrant abdominal pain. EXAM: CT ABDOMEN AND PELVIS WITH CONTRAST TECHNIQUE: Multidetector CT imaging of the abdomen and pelvis was performed using the standard protocol following bolus administration of intravenous contrast. CONTRAST:  122m OMNIPAQUE IOHEXOL 300 MG/ML  SOLN COMPARISON:  January 16, 2018 FINDINGS: Lower chest: No acute abnormality. Hepatobiliary: No focal liver abnormality is seen. Status post cholecystectomy. No biliary dilatation. Pancreas: Unremarkable. No pancreatic ductal dilatation or surrounding inflammatory changes. Spleen: Normal in size without focal abnormality. Adrenals/Urinary Tract: Adrenal glands are unremarkable. Kidneys are normal in size. Mild cortical scarring is seen along the posterior aspect of the mid right kidney. A 2 mm nonobstructing renal stone is  seen within the mid to upper left kidney. There is no evidence of hydronephrosis. The urinary bladder is almost empty and subsequently limited in evaluation. Stomach/Bowel: Stomach is within normal limits. Appendix appears normal. No evidence of bowel wall  thickening, distention, or inflammatory changes. Vascular/Lymphatic: No significant vascular findings are present. No enlarged abdominal or pelvic lymph nodes. Reproductive: The uterus is normal in size and appearance. A 1.8 cm diameter cyst is seen within the right adnexa. Other: No abdominal wall hernia or abnormality. No abdominopelvic ascites. Musculoskeletal: No acute or significant osseous findings. IMPRESSION: 1. Evidence of prior cholecystectomy. 2. 2 mm nonobstructing renal stone within the left kidney. 3. 1.8 cm right adnexal cyst, likely ovarian in origin. Electronically Signed   By: Virgina Norfolk M.D.   On: 02/17/2020 20:21    Procedures Procedures (including critical care time)  Medications Ordered in ED Medications  sodium chloride 0.9 % bolus 1,000 mL (0 mLs Intravenous Stopped 02/17/20 2108)  ondansetron (ZOFRAN) injection 4 mg (4 mg Intravenous Given 02/17/20 1942)  morphine 4 MG/ML injection 4 mg (4 mg Intravenous Given 02/17/20 1943)  iohexol (OMNIPAQUE) 300 MG/ML solution 100 mL (100 mLs Intravenous Contrast Given 02/17/20 1958)    ED Course  I have reviewed the triage vital signs and the nursing notes.  Pertinent labs & imaging results that were available during my care of the patient were reviewed by me and considered in my medical decision making (see chart for details).  Clinical Course as of Feb 17 1126  Thu Feb 17, 2020  2051 Reviewed work-up with patient.  CT not showing any obvious explanation for her pain.  Normal appendix.  Also no signs of biliary dilatation so unlikely to be retained stone.  She is comfortable with plan for discharge and follow-up with her PCP for repeat labs.  She has acute hepatitis panel pending.  Consideration for possible hep A with the diarrhea.   [MB]    Clinical Course User Index [MB] Hayden Rasmussen, MD   MDM Rules/Calculators/A&P                         This patient complains of right-sided abdominal pain and diarrhea; this  involves an extensive number of treatment Options and is a complaint that carries with it a high risk of complications and Morbidity. The differential includes infectious diarrhea, colitis, appendicitis, retained bile stone, pancreatitis  I ordered, reviewed and interpreted labs, which included CBC with normal white count normal hemoglobin, chemistries fairly normal other than elevations of AST ALT and alk phos (patient has had a prior cholecystectomy), urinalysis without signs of infection, pregnancy test negative I ordered medication IV fluids IV pain medication and nausea medication with improvement in her symptoms I ordered imaging studies which included CT abdomen and pelvis and I independently    visualized and interpreted imaging which showed nonobstructing renal stone, ovarian cyst Previous records obtained and reviewed in epic, no recent admissions  After the interventions stated above, I reevaluated the patient and found patient symptoms to be improved.  I reviewed the results of her testing with her including her abnormal LFTs.  Have added on acute hepatitis panel which is pending at time of discharge.  She has a primary care doctor so recommended close follow-up with her PCP for repeat LFTs.  Return instructions discussed.   Final Clinical Impression(s) / ED Diagnoses Final diagnoses:  Generalized abdominal pain  Diarrhea, unspecified type  Elevated LFTs  Rx / DC Orders ED Discharge Orders    None       Hayden Rasmussen, MD 02/18/20 1131

## 2020-02-17 NOTE — Discharge Instructions (Addendum)
You were seen in the emergency department for evaluation of right-sided abdominal pain and diarrhea.  You had blood work and a CAT scan of your abdomen and pelvis.  Your liver enzymes were elevated and will need to be followed.  The CAT scan of your abdomen and pelvis did not show an obvious explanation for your symptoms.  Please drink plenty of fluids.  Contact your primary care doctor for close follow-up.  Ibuprofen as needed for pain.  Return to the emergency department if any worsening or concerning symptoms.  Included below is the CT report:  IMPRESSION:  1. Evidence of prior cholecystectomy.  2. 2 mm nonobstructing renal stone within the left kidney.  3. 1.8 cm right adnexal cyst, likely ovarian in origin.

## 2020-02-17 NOTE — ED Triage Notes (Signed)
Patient c/o right mid abdominal pain that radiates to the navel and diarrhea x 3 days.

## 2020-02-18 LAB — HEPATITIS PANEL, ACUTE
HCV Ab: NONREACTIVE
Hep A IgM: NONREACTIVE
Hep B C IgM: NONREACTIVE
Hepatitis B Surface Ag: NONREACTIVE

## 2020-02-28 ENCOUNTER — Inpatient Hospital Stay: Payer: Medicaid Other | Admitting: Family Medicine

## 2020-03-09 ENCOUNTER — Ambulatory Visit: Payer: Medicaid Other | Admitting: Family Medicine

## 2020-03-13 ENCOUNTER — Encounter: Payer: Self-pay | Admitting: Internal Medicine

## 2020-03-13 ENCOUNTER — Ambulatory Visit: Payer: Medicaid Other | Attending: Family Medicine | Admitting: Internal Medicine

## 2020-03-13 ENCOUNTER — Other Ambulatory Visit: Payer: Self-pay

## 2020-03-13 VITALS — BP 131/85 | HR 87 | Resp 16 | Wt 176.8 lb

## 2020-03-13 DIAGNOSIS — R1011 Right upper quadrant pain: Secondary | ICD-10-CM | POA: Insufficient documentation

## 2020-03-13 DIAGNOSIS — R7989 Other specified abnormal findings of blood chemistry: Secondary | ICD-10-CM | POA: Insufficient documentation

## 2020-03-13 DIAGNOSIS — R197 Diarrhea, unspecified: Secondary | ICD-10-CM | POA: Insufficient documentation

## 2020-03-13 DIAGNOSIS — R945 Abnormal results of liver function studies: Secondary | ICD-10-CM | POA: Diagnosis not present

## 2020-03-13 NOTE — Progress Notes (Signed)
Patient ID: [name], female    DOB: 28-Jul-1980  MRN: [id number]  CC: Hospitalization Follow-up (ED)   Subjective: [name] is a 39 y.o. female who presents for ER f/u.  PCP is Dr. Jillyn HiddenFulp Her concerns today include:  Patient with history of HTN, anxiety/depression, tobacco dependence  Patient presents as follow-up visit from the emergency room where she was seen 02/17/2020 with complaint of right mid abdominal pain radiating to the epigastric area and associated with loose stools and nausea for 3 days.  Patient with past history of cholecystectomy.  On exam she was afebrile.  She was noted to have some tenderness in the epigastric area with no rebound.  Chemistry revealed potassium of 3.4 and elevated AST of 102, ALT 113, alkaline phosphatase 140.  Acute viral hepatitis panel was negative.  Lipase normal.  UA revealed no blood or UTI findings.  CAT scan of the abdomen revealed no focal liver abnormality or biliary dilation.  Incidental finding of 2 mm nonobstructing renal stone within the left kidney and a 1.6 right adnexal cyst likely ovarian in origin.  Patient advised to follow-up with her PCP for further evaluation of abnormal liver function test.  Today: Patient reports that her symptoms had resolved about 3 days after the ER visit.  However some of her symptoms returned about 2 weeks ago but not as severe.  She is having loose stools 4 times a day with a little nausea.  She has been trying to take Imodium but she finds that it hurts her stomach.  She also has been having slight pain in the right mid to upper abdomen that comes and goes lasting about 30 minutes to an hour.  No fevers.  No dysuria.  Symptoms are no better or worse with food.  She denies any recent antibiotic use.  No recent international travel.  She drinks 2 to 3 12 ounce cans of beer every night  Patient Active Problem List   Diagnosis Date Noted  . MDD (major depressive disorder), recurrent severe, without  psychosis (HCC) 07/23/2017  . MDD (major depressive disorder), single episode, severe , no psychosis (HCC) 07/20/2017  . Acute sinusitis with symptoms > 10 days 05/29/2017  . Postpartum complication 06/13/2015  . Normal labor and delivery 06/06/2015  . Indication for care in labor or delivery 06/06/2015  . S/P cesarean section 06/06/2015  . Asthma, chronic 07/29/2014  . Depression 07/29/2014  . Smoking 07/29/2014  . Essential hypertension, benign 07/29/2014  . Missed abortion 10/02/2013     Current Outpatient Medications on File Prior to Visit  Medication Sig Dispense Refill  . albuterol (PROVENTIL) (2.5 MG/3ML) 0.083% nebulizer solution Take 3 mLs (2.5 mg total) by nebulization every 6 (six) hours as needed for wheezing or shortness of breath. (Patient not taking: Reported on 02/17/2020) 75 mL 12  . albuterol (VENTOLIN HFA) 108 (90 Base) MCG/ACT inhaler Inhale 1-2 puffs into the lungs every 6 (six) hours as needed for wheezing or shortness of breath. 18 g 2  . atomoxetine (STRATTERA) 40 MG capsule Take 40 mg by mouth every evening.     . cariprazine (VRAYLAR) capsule Take 1.5 mg by mouth at bedtime.    . Fluticasone-Salmeterol (ADVAIR) 250-50 MCG/DOSE AEPB Inhale 1 puff into the lungs every 12 (twelve) hours. 60 each 11  . losartan-hydrochlorothiazide (HYZAAR) 100-25 MG tablet TAKE 1 TABLET BY MOUTH DAILY. 90 tablet 2  . metoprolol tartrate (LOPRESSOR) 25 MG tablet Take 1 tablet (25 mg total) by  mouth 2 (two) times daily. (Patient not taking: Reported on 02/17/2020) 180 tablet 1  . pantoprazole (PROTONIX) 40 MG tablet Take 1 tablet (40 mg total) by mouth daily. 30 tablet 11  . traZODone (DESYREL) 50 MG tablet Take 1 tablet (50 mg total) by mouth at bedtime as needed for sleep. (Patient taking differently: Take 100 mg by mouth at bedtime as needed for sleep. ) 30 tablet 0  . varenicline (CHANTIX CONTINUING MONTH PAK) 1 MG tablet Take 1 tablet (1 mg total) by mouth 2 (two) times daily. (Patient  not taking: Reported on 02/17/2020) 60 tablet 3  . varenicline (CHANTIX PAK) 0.5 MG X 11 & 1 MG X 42 tablet Take one 0.5 mg tablet by mouth once daily for 3 days, then increase to one 0.5 mg tablet twice daily for 4 days, then increase to one 1 mg tablet twice daily. (Patient not taking: Reported on 02/17/2020) 53 tablet 0  . Vitamin D, Ergocalciferol, (DRISDOL) 1.25 MG (50000 UNIT) CAPS capsule Take 1 capsule (50,000 Units total) by mouth every 7 (seven) days. Once per week (Patient not taking: Reported on 02/17/2020) 12 capsule 0   No current facility-administered medications on file prior to visit.    Allergies  Allergen Reactions  . Penicillins Nausea And Vomiting and Other (See Comments)    UNSPECIFIED REACTION  Has patient had a PCN reaction causing immediate rash, facial/tongue/throat swelling, SOB or lightheadedness with hypotension:No Has patient had a PCN reaction causing severe rash involving mucus membranes or skin necrosis:No Has patient had a PCN reaction that REACTION THAT REQUIRED HOSPITALIZATION:  #  #  #  YES  #  #  #  Has patient had a PCN reaction occurring within the last 10 years: #  #  #  YES  #  #  #   . Norvasc [Amlodipine Besylate] Other (See Comments)    Hypersensitivity? Tip of tongue numb and flushing.    Social History   Socioeconomic History  . Marital status: Single    Spouse name: Not on file  . Number of children: Not on file  . Years of education: Not on file  . Highest education level: Not on file  Occupational History  . Not on file  Tobacco Use  . Smoking status: Current Some Day Smoker    Packs/day: 0.25    Years: 15.00    Pack years: 3.75    Types: Cigarettes  . Smokeless tobacco: Never Used  Vaping Use  . Vaping Use: Never used  Substance and Sexual Activity  . Alcohol use: Yes    Alcohol/week: 4.0 standard drinks    Types: 4 Cans of beer per week  . Drug use: No  . Sexual activity: Yes    Partners: Male    Birth control/protection:  Surgical  Other Topics Concern  . Not on file  Social History Narrative  . Not on file   Social Determinants of Health   Financial Resource Strain:   . Difficulty of Paying Living Expenses: Not on file  Food Insecurity:   . Worried About Programme researcher, broadcasting/film/video in the Last Year: Not on file  . Ran Out of Food in the Last Year: Not on file  Transportation Needs:   . Lack of Transportation (Medical): Not on file  . Lack of Transportation (Non-Medical): Not on file  Physical Activity:   . Days of Exercise per Week: Not on file  . Minutes of Exercise per Session: Not on file  Stress:   . Feeling of Stress : Not on file  Social Connections:   . Frequency of Communication with Friends and Family: Not on file  . Frequency of Social Gatherings with Friends and Family: Not on file  . Attends Religious Services: Not on file  . Active Member of Clubs or Organizations: Not on file  . Attends Banker Meetings: Not on file  . Marital Status: Not on file  Intimate Partner Violence:   . Fear of Current or Ex-Partner: Not on file  . Emotionally Abused: Not on file  . Physically Abused: Not on file  . Sexually Abused: Not on file    Family History  Problem Relation Age of Onset  . Hearing loss Mother   . Asthma Mother   . Asthma Father   . Diabetes Son   . Cancer Maternal Grandfather        bone  . Liver disease Maternal Grandfather   . Asthma Sister   . Lupus Sister     Past Surgical History:  Procedure Laterality Date  . CESAREAN SECTION    . CESAREAN SECTION N/A 06/06/2015   Procedure: CESAREAN SECTION;  Surgeon: Kathreen Cosier, MD;  Location: [facility];  Service: Obstetrics;  Laterality: N/A;  . CHOLECYSTECTOMY N/A 08/23/2016   Procedure: LAPAROSCOPIC CHOLECYSTECTOMY;  Surgeon: Berna Bue, MD;  Location: [facility];  Service: General;  Laterality: N/A;  . DILATION AND CURETTAGE OF UTERUS    . DILATION AND EVACUATION N/A 10/04/2013   Procedure: DILATATION AND  EVACUATION;  Surgeon: Sherian Rein, MD;  Location: [facility];  Service: Gynecology;  Laterality: N/A;  [place] in room   . TONSILLECTOMY    . TUBAL LIGATION      ROS: Review of Systems Negative except as stated above  PHYSICAL EXAM: BP 131/85   Pulse 87   Resp 16   Wt 176 lb 12.8 oz (80.2 kg)   SpO2 96%   BMI 30.35 kg/m   Physical Exam  General appearance - alert, well appearing, young to middle age Caucasian female and in no distress Mental status - normal mood, behavior, speech, dress, motor activity, and thought processes Eyes -nonicteric sclera Mouth -oral mucosa is moist Abdomen - soft, nontender, nondistended, no masses or organomegaly   CMP Latest Ref Rng & Units 02/17/2020 09/29/2019 08/19/2019  Glucose 70 - 99 mg/dL 355(D) 91 32(K)  BUN 6 - 20 mg/dL 9 10 7   Creatinine 0.44 - 1.00 mg/dL ) 0.25(K) 2.70(W)  Sodium 135 - 145 mmol/L 136 144 137  Potassium 3.5 - 5.1 mmol/L 3.4(L) 4.2 4.3  Chloride 98 - 111 mmol/L 101 102 96  CO2 22 - 32 mmol/L 24 21 27   Calcium 8.9 - 10.3 mg/dL 9.5 9.6 2.37(S  Total Protein 6.5 - 8.1 g/dL 7.6 - -  Total Bilirubin 0.3 - 1.2 mg/dL 0.6 - -  Alkaline Phos 38 - 126 U/L 140(H) - -  AST 15 - 41 U/L 102(H) - -  ALT 0 - 44 U/L 113(H) - -   Lipid Panel  No results found for: CHOL, TRIG, HDL, CHOLHDL, VLDL, LDLCALC, LDLDIRECT  CBC    Component Value Date/Time   WBC 8.3 02/17/2020 1338   RBC 5.47 (H) 02/17/2020 1338   HGB 15.8 (H) 02/17/2020 1338   HGB 16.1 (H) 08/19/2019 1426   HCT 47.7 (H) 02/17/2020 1338   HCT 48.6 (H) 08/19/2019 1426   PLT 379 02/17/2020 1338   PLT 347 08/19/2019 1426  MCV 87.2 02/17/2020 1338   MCV 90 08/19/2019 1426   MCH 28.9 02/17/2020 1338   MCHC 33.1 02/17/2020 1338   RDW 14.1 02/17/2020 1338   RDW 13.3 08/19/2019 1426   LYMPHSABS 1.6 03/15/2019 1437   MONOABS 0.8 03/15/2019 1437   EOSABS 0.1 03/15/2019 1437   BASOSABS 0.1 03/15/2019 1437    ASSESSMENT AND PLAN: 1. Abnormal LFTs Patient with  elevated liver enzymes in the presence of them intermittent right upper quadrant pain and diarrhea.  Advised patient to cut back on the amount of beer that she is drinking every day to no more than 1 standard drink a day.  Though her liver enzymes are not in a pattern to suggest EtOH induced abnormality.  We will recheck liver enzymes today.  If still elevated we will refer her to the gastroenterologist. - Mitochondrial Antibodies - Comprehensive metabolic panel  2. Right upper quadrant abdominal pain 3. Diarrhea, unspecified type I recommend trying Pepto-Bismol over-the-counter.  We will check stools for C. difficile and routine culture.  Further management will be based on results. - Cdiff NAA+O+P+Stool Culture - Comprehensive metabolic panel    Patient was given the opportunity to ask questions.  Patient verbalized understanding of the plan and was able to repeat key elements of the plan.   Orders Placed This Encounter  Procedures  . Cdiff NAA+O+P+Stool Culture  . Mitochondrial Antibodies  . Comprehensive metabolic panel     Requested Prescriptions    No prescriptions requested or ordered in this encounter    Return if symptoms worsen or fail to improve.  Jonah Blue, MD, FACP

## 2020-03-13 NOTE — Patient Instructions (Signed)
Purchase and use some Pepto-Bismol as needed.  This can be purchased over-the-counter. Try to cut back to no more than 1 standard alcoholic beverage a day as discussed.

## 2020-03-14 ENCOUNTER — Encounter: Payer: Self-pay | Admitting: Internal Medicine

## 2020-03-14 LAB — COMPREHENSIVE METABOLIC PANEL
ALT: 17 IU/L (ref 0–32)
AST: 19 IU/L (ref 0–40)
Albumin/Globulin Ratio: 2 (ref 1.2–2.2)
Albumin: 4.3 g/dL (ref 3.8–4.8)
Alkaline Phosphatase: 95 IU/L (ref 44–121)
BUN/Creatinine Ratio: 10 (ref 9–23)
BUN: 9 mg/dL (ref 6–20)
Bilirubin Total: 0.3 mg/dL (ref 0.0–1.2)
CO2: 22 mmol/L (ref 20–29)
Calcium: 9.6 mg/dL (ref 8.7–10.2)
Chloride: 100 mmol/L (ref 96–106)
Creatinine, Ser: 0.89 mg/dL (ref 0.57–1.00)
GFR calc Af Amer: 96 mL/min/{1.73_m2} (ref >59)
GFR calc non Af Amer: 83 mL/min/{1.73_m2} (ref >59)
Globulin, Total: 2.2 g/dL (ref 1.5–4.5)
Glucose: 86 mg/dL (ref 65–99)
Potassium: 4.2 mmol/L (ref 3.5–5.2)
Sodium: 138 mmol/L (ref 134–144)
Total Protein: 6.5 g/dL (ref 6.0–8.5)

## 2020-03-14 LAB — MITOCHONDRIAL ANTIBODIES: Mitochondrial Ab: <20 Units (ref 0.0–20.0)

## 2020-03-21 ENCOUNTER — Encounter: Payer: Self-pay | Admitting: Family Medicine

## 2020-03-22 MED FILL — PROAIR HFA 90 MCG INHALER: 108 (90 BAS | 25 days supply | Qty: 9 | Fill #2

## 2020-03-30 ENCOUNTER — Telehealth (INDEPENDENT_AMBULATORY_CARE_PROVIDER_SITE_OTHER): Payer: Self-pay | Admitting: Family Medicine

## 2020-03-30 NOTE — Telephone Encounter (Signed)
Copied from CRM [id number]. Topic: General - Other >> Mar 29, 2020 10:00 AM Jaquita Rector A wrote: Reason for CRM: Patient called in to request a copy of her demographics with Dr signature on it  to take with her to social security since she have lost her card and need a replacement. Would like to pick it up today if possible. Please call when done Ph# [phone]

## 2020-03-30 NOTE — Telephone Encounter (Signed)
Pt called requesting her chart demographics because she needs to get her SSN card for a job. Demographics printed and signed by provider. Available up front for pt pickup

## 2020-04-05 MED FILL — PROAIR HFA 90 MCG INHALER: 108 (90 BAS | 25 days supply | Qty: 9 | Fill #2

## 2020-04-17 ENCOUNTER — Other Ambulatory Visit: Payer: Self-pay | Admitting: Family Medicine

## 2020-04-17 MED ORDER — LOSARTAN POTASSIUM-HCTZ 100-25 MG PO TABS: 1.0000 | ORAL_TABLET | Freq: Every day | ORAL | 0 refills | Status: DC

## 2020-04-17 NOTE — Telephone Encounter (Signed)
Medication Refill - Medication: losartan-hydrochlorothiazide (HYZAAR) 100-25 MG tablet     Preferred Pharmacy (with phone number or street name):  [id number] - [place], [place] - [address] AT [address] [facility] [address] Phone:  [phone]  Fax:  [phone]       Agent: Please be advised that RX refills may take up to 3 business days. We ask that you follow-up with your pharmacy.

## 2020-05-17 DIAGNOSIS — R52 Pain, unspecified: Secondary | ICD-10-CM | POA: Diagnosis not present

## 2020-05-17 DIAGNOSIS — R059 Cough, unspecified: Secondary | ICD-10-CM | POA: Diagnosis not present

## 2020-05-17 DIAGNOSIS — Z20822 Contact with and (suspected) exposure to covid-19: Secondary | ICD-10-CM | POA: Diagnosis not present

## 2020-05-17 DIAGNOSIS — Z03818 Encounter for observation for suspected exposure to other biological agents ruled out: Secondary | ICD-10-CM | POA: Diagnosis not present

## 2020-07-12 ENCOUNTER — Other Ambulatory Visit: Payer: Self-pay | Admitting: Family Medicine

## 2020-09-14 ENCOUNTER — Other Ambulatory Visit: Payer: Self-pay

## 2020-09-14 ENCOUNTER — Other Ambulatory Visit: Payer: Self-pay | Admitting: Family Medicine

## 2020-09-14 DIAGNOSIS — J45909 Unspecified asthma, uncomplicated: Secondary | ICD-10-CM

## 2020-09-15 ENCOUNTER — Other Ambulatory Visit: Payer: Self-pay

## 2020-09-15 MED ORDER — ALBUTEROL SULFATE HFA 108 (90 BASE) MCG/ACT IN AERS
1.0000 | INHALATION_SPRAY | Freq: Four times a day (QID) | RESPIRATORY_TRACT | 2 refills | Status: DC | PRN
  Filled 2020-09-15 – 2020-10-17 (×2): qty 8.5, 25d supply, fill #0

## 2020-09-22 ENCOUNTER — Other Ambulatory Visit: Payer: Self-pay

## 2020-10-07 ENCOUNTER — Other Ambulatory Visit: Payer: Self-pay | Admitting: Internal Medicine

## 2020-10-14 ENCOUNTER — Other Ambulatory Visit: Payer: Self-pay | Admitting: Family Medicine

## 2020-10-15 ENCOUNTER — Other Ambulatory Visit: Payer: Self-pay | Admitting: Internal Medicine

## 2020-10-15 NOTE — Telephone Encounter (Signed)
Pt overdue for office visit- message sent to pt via MyChart that she is due for appt.  Last RF 07/12/20  Requested Prescriptions  Pending Prescriptions Disp Refills  . losartan-hydrochlorothiazide (HYZAAR) 100-25 MG tablet [Pharmacy Med Name: LOSARTAN-HCTZ 100-25 MG TAB] 30 tablet 0    Sig: TAKE 1 TABLET BY MOUTH EVERY DAY     Cardiovascular: ARB + Diuretic Combos Failed - 10/14/2020 11:42 PM      Failed - K in normal range and within 180 days    Potassium  Date Value Ref Range Status  03/13/2020 4.2 3.5 - 5.2 mmol/L Final         Failed - Na in normal range and within 180 days    Sodium  Date Value Ref Range Status  03/13/2020 138 134 - 144 mmol/L Final         Failed - Cr in normal range and within 180 days    Creat  Date Value Ref Range Status  10/31/2015 1.09 0.50 - 1.10 mg/dL Final   Creatinine, Ser  Date Value Ref Range Status  03/13/2020 0.89 0.57 - 1.00 mg/dL Final   Creatinine, Urine  Date Value Ref Range Status  06/04/2015 41.00 mg/dL Final         Failed - Ca in normal range and within 180 days    Calcium  Date Value Ref Range Status  03/13/2020 9.6 8.7 - 10.2 mg/dL Final   Calcium, Ion  Date Value Ref Range Status  07/05/2013 1.30 (H) 1.12 - 1.23 mmol/L Final         Failed - Valid encounter within last 6 months    Recent Outpatient Visits          7 months ago Abnormal LFTs   [facility] Marcine Matar, MD   1 year ago Essential hypertension   [place] Drucilla Chalet, RPH-CPP   1 year ago Essential hypertension   [place] [facility] Drucilla Chalet, RPH-CPP   1 year ago Fatigue, unspecified type   [facility] Drucilla Chalet, RPH-CPP   1 year ago Essential hypertension   [facility] Bella Villa, [place], MD             Passed - Patient is not pregnant      Passed - Last BP in  normal range    BP Readings from Last 1 Encounters:  03/13/20 131/85

## 2020-10-17 ENCOUNTER — Other Ambulatory Visit: Payer: Self-pay | Admitting: Family Medicine

## 2020-10-17 ENCOUNTER — Telehealth: Payer: Medicaid Other | Admitting: Physician Assistant

## 2020-10-17 ENCOUNTER — Other Ambulatory Visit: Payer: Self-pay

## 2020-10-17 DIAGNOSIS — J4541 Moderate persistent asthma with (acute) exacerbation: Secondary | ICD-10-CM | POA: Diagnosis not present

## 2020-10-17 MED ORDER — FLUTICASONE-SALMETEROL 250-50 MCG/ACT IN AEPB: 1.0000 | INHALATION_SPRAY | Freq: Two times a day (BID) | RESPIRATORY_TRACT | 0 refills | Status: DC

## 2020-10-17 MED ORDER — ALBUTEROL SULFATE HFA 108 (90 BASE) MCG/ACT IN AERS: 1.0000 | INHALATION_SPRAY | Freq: Four times a day (QID) | RESPIRATORY_TRACT | 0 refills | Status: DC | PRN

## 2020-10-17 MED ORDER — LOSARTAN POTASSIUM-HCTZ 100-25 MG PO TABS
1.0000 | ORAL_TABLET | Freq: Every day | ORAL | 0 refills | Status: DC
  Filled 2020-10-17: qty 30, 30d supply, fill #0

## 2020-10-17 NOTE — Progress Notes (Signed)
I have spent 5 minutes in review of e-visit questionnaire, review and updating patient chart, medical decision making and response to patient.   Yamin Swingler Cody Loi Rennaker, PA-C    

## 2020-10-17 NOTE — Progress Notes (Signed)
Visit for Asthma  Based on what you have shared with me, it looks like you may have a flare up of your asthma.  Asthma is a chronic (ongoing) lung disease which results in airway obstruction, inflammation and hyper-responsiveness.   Asthma symptoms vary from person to person, with common symptoms including nighttime awakening and decreased ability to participate in normal activities as a result of shortness of breath. It is often triggered by changes in weather, changes in the season, changes in air temperature, or inside (home, school, daycare or work) allergens such as animal dander, mold, mildew, woodstoves or cockroaches.   It can also be triggered by hormonal changes, extreme emotion, physical exertion or an upper respiratory tract illness.     It is important to identify the trigger, and then eliminate or avoid the trigger if possible.   If you have been prescribed medications to be taken on a regular basis, it is important to follow the asthma action plan and to follow guidelines to adjust medication in response to increasing symptoms of decreased peak expiratory flow rate  Treatment: I have prescribed: Albuterol (Proventil HFA; Ventolin HFA) 108 (90 Base) MCG/ACT Inhaler 2 puffs into the lungs every six hours as needed for wheezing or shortness of breath. I will give a one-time fill of your Advair to help with exacerbation of asthma and to get things calm down. You will need to reach out to your PCP about additional refills or will have to be seen in person at urgent care for this. We do not treat hypertension (high blood pressure) or do chronic medication refills via evisit or video on demand visit so again you will need to reach out to PCP or be seen in person for reassessment of BP and refills of other chronic medications.   HOME CARE . Only take medications as instructed by your  medical team. . Consider wearing a mask or scarf to improve breathing air temperature have been shown to decrease irritation and decrease exacerbations . Get rest. . Taking a steamy shower or using a humidifier may help nasal congestion sand ease sore throat pain. You can place a towel over your head and breathe in the steam from hot water coming from a faucet. . Using a saline nasal spray works much the same way.  . Cough drops, hare candies and sore throat lozenges may ease your cough.  . Avoid close contacts especially the very you and the elderly . Cover your mouth if you cough or sneeze . Always remember to wash your hands.    GET HELP RIGHT AWAY IF: . You develop worsening symptoms; breathlessness at rest, drowsy, confused or agitated, unable to speak in full sentences . You have coughing fits . You develop a severe headache or visual changes . You develop shortness of breath, difficulty breathing or start having chest pain . Your symptoms persist after you have completed your treatment plan . If your symptoms do not improve within 10 days  MAKE SURE YOU . Understand these instructions. . Will watch your condition. . Will get help right away if you are not doing well or get worse.   Your e-visit answers were reviewed by a board certified advanced clinical practitioner to complete your personal care plan, Depending upon the condition, your plan could have included both over the counter or prescription medications.  Please review your pharmacy choice. Your safety is important to [place]. If you have drug allergies check your prescription carefully. You can use MyChart  to ask questions about today's visit, request a non-urgent call back, or ask for a work or school excuse for 24 hours related to this e-Visit. If it has been greater than 24 hours you will need to follow up with your provider, or enter a new e-Visit to address those concerns.  You will get an e-mail in the next two days  asking about your experience. I hope that your e-visit has been valuable and will speed your recovery. Thank you for using e-visits.

## 2020-11-10 ENCOUNTER — Other Ambulatory Visit: Payer: Self-pay | Admitting: Family Medicine

## 2020-11-10 NOTE — Telephone Encounter (Signed)
   Notes to clinic: Patient has appt on 12/07/2020 Review for another refill    Requested Prescriptions  Pending Prescriptions Disp Refills   losartan-hydrochlorothiazide (HYZAAR) 100-25 MG tablet [Pharmacy Med Name: LOSARTAN-HCTZ 100-25 MG TAB] 90 tablet     Sig: TAKE 1 TABLET BY MOUTH EVERY DAY      Cardiovascular: ARB + Diuretic Combos Failed - 11/10/2020 11:33 AM      Failed - K in normal range and within 180 days    Potassium  Date Value Ref Range Status  03/13/2020 4.2 3.5 - 5.2 mmol/L Final          Failed - Na in normal range and within 180 days    Sodium  Date Value Ref Range Status  03/13/2020 138 134 - 144 mmol/L Final          Failed - Cr in normal range and within 180 days    Creat  Date Value Ref Range Status  10/31/2015 1.09 0.50 - 1.10 mg/dL Final   Creatinine, Ser  Date Value Ref Range Status  03/13/2020 0.89 0.57 - 1.00 mg/dL Final   Creatinine, Urine  Date Value Ref Range Status  06/04/2015 41.00 mg/dL Final          Failed - Ca in normal range and within 180 days    Calcium  Date Value Ref Range Status  03/13/2020 9.6 8.7 - 10.2 mg/dL Final   Calcium, Ion  Date Value Ref Range Status  07/05/2013 1.30 (H) 1.12 - 1.23 mmol/L Final          Failed - Valid encounter within last 6 months    Recent Outpatient Visits           8 months ago Abnormal LFTs   [facility] Marcine Matar, MD   1 year ago Essential hypertension   [facility] Drucilla Chalet, RPH-CPP   1 year ago Essential hypertension   [facility] Drucilla Chalet, RPH-CPP   1 year ago Fatigue, unspecified type   [facility] Drucilla Chalet, RPH-CPP   1 year ago Essential hypertension   [facility] Cain Saupe, MD       Future Appointments             In 3 weeks Claiborne Rigg, NP   [facility]             Passed - Patient is not pregnant      Passed - Last BP in normal range    BP Readings from Last 1 Encounters:  03/13/20 131/85

## 2020-12-06 ENCOUNTER — Encounter: Payer: Self-pay | Admitting: Nurse Practitioner

## 2020-12-06 ENCOUNTER — Ambulatory Visit: Payer: Medicaid Other | Attending: Nurse Practitioner | Admitting: Nurse Practitioner

## 2020-12-06 ENCOUNTER — Other Ambulatory Visit: Payer: Self-pay

## 2020-12-06 DIAGNOSIS — J4541 Moderate persistent asthma with (acute) exacerbation: Secondary | ICD-10-CM

## 2020-12-06 NOTE — Progress Notes (Signed)
Patient has established on 11-29-2020 at [facility]

## 2020-12-14 ENCOUNTER — Other Ambulatory Visit: Payer: Self-pay

## 2020-12-14 ENCOUNTER — Other Ambulatory Visit: Payer: Self-pay | Admitting: Internal Medicine

## 2020-12-14 ENCOUNTER — Other Ambulatory Visit: Payer: Self-pay | Admitting: Family Medicine

## 2020-12-14 DIAGNOSIS — J4541 Moderate persistent asthma with (acute) exacerbation: Secondary | ICD-10-CM

## 2020-12-14 MED ORDER — ALBUTEROL SULFATE HFA 108 (90 BASE) MCG/ACT IN AERS: 1.0000 | INHALATION_SPRAY | Freq: Four times a day (QID) | RESPIRATORY_TRACT | 0 refills | Status: DC | PRN

## 2020-12-14 NOTE — Telephone Encounter (Signed)
  Notes to clinic: Looks like patient has new pcp  Review for refill    Requested Prescriptions  Pending Prescriptions Disp Refills   losartan-hydrochlorothiazide (HYZAAR) 100-25 MG tablet [Pharmacy Med Name: LOSARTAN-HCTZ 100-25 MG TAB] 30 tablet 0    Sig: TAKE 1 TABLET BY MOUTH EVERY DAY      Cardiovascular: ARB + Diuretic Combos Failed - 12/14/2020 12:33 PM      Failed - K in normal range and within 180 days    Potassium  Date Value Ref Range Status  03/13/2020 4.2 3.5 - 5.2 mmol/L Final          Failed - Na in normal range and within 180 days    Sodium  Date Value Ref Range Status  03/13/2020 138 134 - 144 mmol/L Final          Failed - Cr in normal range and within 180 days    Creat  Date Value Ref Range Status  10/31/2015 1.09 0.50 - 1.10 mg/dL Final   Creatinine, Ser  Date Value Ref Range Status  03/13/2020 0.89 0.57 - 1.00 mg/dL Final   Creatinine, Urine  Date Value Ref Range Status  06/04/2015 41.00 mg/dL Final          Failed - Ca in normal range and within 180 days    Calcium  Date Value Ref Range Status  03/13/2020 9.6 8.7 - 10.2 mg/dL Final   Calcium, Ion  Date Value Ref Range Status  07/05/2013 1.30 (H) 1.12 - 1.23 mmol/L Final          Passed - Patient is not pregnant      Passed - Last BP in normal range    BP Readings from Last 1 Encounters:  03/13/20 131/85          Passed - Valid encounter within last 6 months    Recent Outpatient Visits           9 months ago Abnormal LFTs   [facility] Marcine Matar, MD   1 year ago Essential hypertension   [facility] [facility] Drucilla Chalet, RPH-CPP   1 year ago Essential hypertension   [facility] [facility] Drucilla Chalet, RPH-CPP   1 year ago Fatigue, unspecified type   [facility] Drucilla Chalet, RPH-CPP   1 year ago Essential hypertension   [place]  [facility] Cain Saupe, MD

## 2021-01-17 ENCOUNTER — Other Ambulatory Visit: Payer: Self-pay | Admitting: Internal Medicine

## 2021-03-27 ENCOUNTER — Encounter: Payer: Self-pay | Admitting: Internal Medicine

## 2021-03-27 ENCOUNTER — Other Ambulatory Visit: Payer: Self-pay

## 2021-03-27 ENCOUNTER — Telehealth: Payer: Self-pay | Admitting: Internal Medicine

## 2021-03-27 ENCOUNTER — Ambulatory Visit (INDEPENDENT_AMBULATORY_CARE_PROVIDER_SITE_OTHER): Payer: Medicaid Other | Admitting: Internal Medicine

## 2021-03-27 VITALS — BP 126/82 | HR 103 | Temp 98.0°F | Ht 64.0 in | Wt 179.8 lb

## 2021-03-27 DIAGNOSIS — J449 Chronic obstructive pulmonary disease, unspecified: Secondary | ICD-10-CM

## 2021-03-27 MED ORDER — BREZTRI AEROSPHERE 160-9-4.8 MCG/ACT IN AERO
2.0000 | INHALATION_SPRAY | Freq: Two times a day (BID) | RESPIRATORY_TRACT | 3 refills | Status: DC
Start: 2021-03-27 — End: 2021-08-30

## 2021-03-27 MED ORDER — ALBUTEROL SULFATE (2.5 MG/3ML) 0.083% IN NEBU: 2.5000 mg | INHALATION_SOLUTION | Freq: Four times a day (QID) | RESPIRATORY_TRACT | 12 refills | Status: DC | PRN

## 2021-03-27 NOTE — Patient Instructions (Addendum)
Please schedule follow up scheduled with myself in 1 months.    Before your next visit I would like you to have: Full set of PFTs - 1 hour. Sometime before you see me again.    Stop advair. Start breztri 2 puffs in the morning, 2 puffs at night.  Take the albuterol rescue inhaler every 4 to 6 hours as needed for wheezing or shortness of breath. You can also take it 15 minutes before exercise or exertional activity. Side effects include heart racing or pounding, jitters or anxiety. If you have a history of an irregular heart rhythm, it can make this worse. Can also give some patients a hard time sleeping.  You can also take nebulizer treatments instead of albuterol inhaler. It will be sent to your house. Solution is at the pharmacy.

## 2021-03-27 NOTE — Progress Notes (Signed)
The patient has been prescribed the inhaler Breztri. Inhaler technique was demonstrated to patient. The patient subsequently demonstrated correct technique. ° °

## 2021-03-27 NOTE — Progress Notes (Signed)
[name]    [id number]    Jan 17, 1981  Primary Care Physician:Keatts, Toni Amend, NP Date of Appointment: 03/27/2021 Established Patient Visit  Chief complaint:   Chief Complaint  Patient presents with   Follow-up    Patient says she's got a bad cough. Started a couple weeks ago.     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  Interval Updates: Here for follow up after almost 2 years. No hospitalizations or ED visits, needed prednisone once.   Currently taking advair 250 1 puff  She has to use albuterol every few hours. Totally 4-5 times/day.   Still smoking 1/2 ppd.   Current Regimen: advair 250 1 puff in the morning, one puff at night, albuterol  Asthma Triggers: cold weather/hot weather, animals, cigarettes, anxiety Exacerbations in the last year: one requiring prednisone History of hospitalization or intubation: never been hospitalized Atopy: none Allergy Testing: never had.  GERD: yes controlled on PPI Allergic Rhinitis:yes ACT:  Asthma Control Test ACT Total Score  03/27/2021 12  07/15/2019 11  03/24/2019 7   FeNO: n/a  I have reviewed the patient's family social and past medical history and updated as appropriate.   Past Medical History:  Diagnosis Date   Anxiety    Asthma    Depression    doing good, not on meds now   GERD (gastroesophageal reflux disease)    Headache    migraines   History of kidney stones    Hypertension    Kidney stones    Missed abortion 10/02/2013   Ovarian cyst    Pregnancy induced hypertension    Suicidal intent    Vaginal Pap smear, abnormal    bx, f/u ok    Past Surgical History:  Procedure Laterality Date   CESAREAN SECTION     CESAREAN SECTION N/A 06/06/2015   Procedure: CESAREAN SECTION;  Surgeon: Kathreen Cosier, MD;  Location: [facility];  Service: Obstetrics;  Laterality: N/A;   CHOLECYSTECTOMY N/A 08/23/2016   Procedure: LAPAROSCOPIC CHOLECYSTECTOMY;  Surgeon:  Berna Bue, MD;  Location: [facility];  Service: General;  Laterality: N/A;   DILATION AND CURETTAGE OF UTERUS     DILATION AND EVACUATION N/A 10/04/2013   Procedure: DILATATION AND EVACUATION;  Surgeon: Sherian Rein, MD;  Location: [facility];  Service: Gynecology;  Laterality: N/A;  [place] in room    TONSILLECTOMY     TUBAL LIGATION      Family History  Problem Relation Age of Onset   Hearing loss Mother    Asthma Mother    Asthma Father    Diabetes Son    Cancer Maternal Grandfather        bone   Liver disease Maternal Grandfather    Asthma Sister    Lupus Sister     Social History   Occupational History   Not on file  Tobacco Use   Smoking status: Some Days    Packs/day: 0.25    Years: 15.00    Pack years: 3.75    Types: Cigarettes   Smokeless tobacco: Never  Vaping Use   Vaping Use: Never used  Substance and Sexual Activity   Alcohol use: Yes    Alcohol/week: 4.0 standard drinks    Types: 4 Cans of beer per week   Drug use: No   Sexual activity: Yes    Partners: Male    Birth control/protection: Surgical    Physical Exam: Blood pressure  126/82, pulse (!) 103, temperature 98 F (36.7 C), temperature source Oral, height 5\' 4"  (1.626 m), weight 179 lb 12.8 oz (81.6 kg), SpO2 94 %.  Gen:      No acute distress, ENT:  no polyps, mallampati IV Lungs:   diminished bilaterally, no wheezes or crackles CV:         tachycardic, regular  Data Reviewed: Imaging: I have personally reviewed the chest xray from 02/2019 which demonstrates no acute cardiopulmonary process  PFTs:  PFT Results Latest Ref Rng & Units 11/16/2015  FVC-Pre L 3.54  FVC-Predicted Pre % 94  Pre FEV1/FVC % % 55  FEV1-Pre L 1.96  FEV1-Predicted Pre % 62  DLCO uncorrected ml/min/mmHg 23.29  DLCO UNC% % 95  DLVA Predicted % 97  TLC L 8.11  TLC % Predicted % 160  RV % Predicted % 292   I have personally reviewed the patient's PFTs and which demonstrate moderate airflow limitation    Labs:  Immunization status: Immunization History  Administered Date(s) Administered   Influenza,inj,Quad PF,6+ Mos 07/29/2014, 06/07/2015, 07/21/2017, 02/05/2018, 03/03/2019   Pneumococcal Polysaccharide-23 07/29/2014, 06/08/2015   Tdap 06/07/2015    Assessment:  Asthma COPD overlap syndrome, worsening control, ACT 12.  Tobacco use disorder, with cessation counseling.  GERD  Plan/Recommendations: Stop advair. Start breztri. She may benefit from triple therapy given her COPD component.  Continue prn albuterol. Will also prescribe nebulizer machine with prn albuterol nebulizer nebs.  Continue PPI.  Send ige, eosinophils at next visit.  Smoking cessation is paramount, discussed in detail.   Smoking Cessation Counseling:  1. The patient is an everyday smoker and symptomatic due to the following condition asthma copd overlap 2. The patient is currently pre-contemplative in quitting smoking. 3. I advised patient to quit smoking. 4. We identified patient specific barriers to change.  5. I personally spent 3  minutes counseling the patient regarding tobacco use disorder. 6. We discussed management of stress and anxiety to help with smoking cessation, when applicable. 7. We discussed nicotine replacement therapy, Wellbutrin, Chantix as possible options. 8. I advised setting a quit date. 9. Follow?up arranged with our office to continue ongoing discussions. 10.Resources given to patient including quit hotline.   Return to Care: Return in about 4 weeks (around 04/24/2021).   14/10/2020, MD Pulmonary and Critical Care Medicine [facility] [phone]

## 2021-03-28 ENCOUNTER — Telehealth: Payer: Self-pay | Admitting: Pharmacy Technician

## 2021-03-28 NOTE — Telephone Encounter (Signed)
Patient Advocate Encounter  Received notification from [employer] that prior authorization for BREZTRI is required.   PA submitted on 11.9.22 [id number] W Status is pending   [facility] will continue to follow  Ricke Hey, CPhT Patient Advocate Phone: [phone] Fax:  [phone]

## 2021-03-28 NOTE — Telephone Encounter (Signed)
ATC x1, left detailed message per DPR.

## 2021-03-28 NOTE — Telephone Encounter (Signed)
PA has been submitted.

## 2021-03-29 MED ORDER — BREZTRI AEROSPHERE 160-9-4.8 MCG/ACT IN AERO: 2.0000 | INHALATION_SPRAY | Freq: Two times a day (BID) | RESPIRATORY_TRACT | 0 refills | Status: DC

## 2021-03-29 NOTE — Telephone Encounter (Signed)
1 Breztri sample placed behind check in desk for pt. Nothing further needed at this time.

## 2021-03-29 NOTE — Telephone Encounter (Signed)
Received a fax regarding Prior Authorization from [employer] / [employer] for BREZTRI. Authorization has been DENIED because PT COVERAGE DOES NOT START UNTIL 12.1.22.

## 2021-03-29 NOTE — Addendum Note (Signed)
Addended by: Dorisann Frames R on: 03/29/2021 11:10 AM   Modules accepted: Orders

## 2021-03-29 NOTE — Telephone Encounter (Signed)
Spoke to patient and relayed below message.  Patient is requesting a sample of Breztri.  Triage, please place a sample up front for pickup. She would like to pickup tomorrow.

## 2021-05-29 ENCOUNTER — Encounter: Payer: Self-pay | Admitting: Surgery

## 2021-05-29 ENCOUNTER — Ambulatory Visit (INDEPENDENT_AMBULATORY_CARE_PROVIDER_SITE_OTHER): Payer: Medicaid Other | Admitting: Surgery

## 2021-05-29 ENCOUNTER — Other Ambulatory Visit: Payer: Self-pay | Admitting: Surgery

## 2021-05-29 ENCOUNTER — Other Ambulatory Visit: Payer: Self-pay

## 2021-05-29 VITALS — BP 129/89 | HR 105 | Temp 98.3°F | Resp 12 | Ht 64.0 in | Wt 176.0 lb

## 2021-05-29 DIAGNOSIS — K644 Residual hemorrhoidal skin tags: Secondary | ICD-10-CM

## 2021-05-29 MED ORDER — AMERICAINE 20 % RE OINT: TOPICAL_OINTMENT | RECTAL | 0 refills | Status: DC | PRN

## 2021-05-29 NOTE — Patient Instructions (Signed)
Buy Metamucil or generic brand of fiber powder to mix with liquid.

## 2021-05-29 NOTE — Progress Notes (Signed)
[facility] History and Physical  Reason for Referral: Hemorrhoids Referring Physician: Pablo Lawrence, NP  Chief Complaint   New Patient (Initial Visit); Hemorrhoids     [name] is a 41 y.o. female.  HPI: Patient presents with a 1 month history of hemorrhoidal pain.  She states that she has had increased pain with bowel movements and wiping.  She denies ever having issues with hemorrhoids in the past.  She has been using Tucks wipes, and Preparation H without any improvement in her pain.  She also has 5-6 bowel movements per day, and states that they are loose in nature.  She did have 1 instance of a small little bit of blood on her toilet paper, but denies any evidence of rectal bleeding otherwise.  Her surgical history is significant for multiple C-sections, cholecystectomy, and tubal ligation.  She smokes 1 pack/day and has done so for the last 16 to 17 years.  She also confirms drinking 2-3 beers every evening.  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  She denies use of blood thinning medications.   Past Medical History:  Diagnosis Date   Anxiety    Asthma    Depression    doing good, not on meds now   GERD (gastroesophageal reflux disease)    Headache    migraines   History of kidney stones    Hypertension    Kidney stones    Missed abortion 10/02/2013   Ovarian cyst    Pregnancy induced hypertension    Suicidal intent    Vaginal Pap smear, abnormal    bx, f/u ok    Past Surgical History:  Procedure Laterality Date   CESAREAN SECTION     CESAREAN SECTION N/A 06/06/2015   Procedure: CESAREAN SECTION;  Surgeon: Frederico Hamman, MD;  Location: [facility];  Service: Obstetrics;  Laterality: N/A;   CHOLECYSTECTOMY N/A 08/23/2016   Procedure: LAPAROSCOPIC CHOLECYSTECTOMY;  Surgeon: Clovis Riley, MD;  Location: [place];  Service: General;  Laterality: N/A;   DILATION AND CURETTAGE OF UTERUS      DILATION AND EVACUATION N/A 10/04/2013   Procedure: DILATATION AND EVACUATION;  Surgeon: Janyth Contes, MD;  Location: [facility];  Service: Gynecology;  Laterality: N/A;  [place] in room    TONSILLECTOMY     TUBAL LIGATION      Family History  Problem Relation Age of Onset   Hearing loss Mother    Asthma Mother    Asthma Father    Diabetes Son    Cancer Maternal Grandfather        bone   Liver disease Maternal Grandfather    Asthma Sister    Lupus Sister     Social History   Tobacco Use   Smoking status: Every Day    Packs/day: 0.25    Years: 15.00    Pack years: 3.75    Types: Cigarettes   Smokeless tobacco: Never  Vaping Use   Vaping Use: Never used  Substance Use Topics   Alcohol use: Yes    Alcohol/week: 4.0 standard drinks    Types: 4 Cans of beer per week   Drug use: No    Medications: I have reviewed the patient's current medications. Allergies as of 05/29/2021       Reactions   Penicillins Nausea And Vomiting, Other (See Comments)   UNSPECIFIED REACTION  Has patient had a PCN reaction causing immediate rash, facial/tongue/throat swelling, SOB  or lightheadedness with hypotension:No Has patient had a PCN reaction causing severe rash involving mucus membranes or skin necrosis:No Has patient had a PCN reaction that REACTION THAT REQUIRED HOSPITALIZATION:  #  #  #  YES  #  #  #  Has patient had a PCN reaction occurring within the last 10 years: #  #  #  YES  #  #  #    Norvasc [amlodipine Besylate] Other (See Comments)   Hypersensitivity? Tip of tongue numb and flushing.        Medication List        Accurate as of May 29, 2021 12:07 PM. If you have any questions, ask your nurse or doctor.          STOP taking these medications    amoxicillin-clavulanate 875-125 MG tablet Commonly known as: AUGMENTIN Stopped by: Mya Suell A Makinley Muscato, DO   cariprazine 1.5 MG capsule Commonly known as: VRAYLAR Stopped by: Makailah Slavick A Elvan Ebron, DO    nitrofurantoin (macrocrystal-monohydrate) 100 MG capsule Commonly known as: MACROBID Stopped by: Neale Marzette A Huntley Knoop, DO   traZODone 50 MG tablet Commonly known as: DESYREL Stopped by: Murdock Jellison A Kou Gucciardo, DO   Vitamin D (Ergocalciferol) 1.25 MG (50000 UNIT) Caps capsule Commonly known as: DRISDOL Stopped by: Emmilee Reamer A Tavious Griesinger, DO       TAKE these medications    albuterol 108 (90 Base) MCG/ACT inhaler Commonly known as: ProAir HFA INHALE 1-2 PUFFS INTO THE LUNGS EVERY 6 (SIX) HOURS AS NEEDED FOR WHEEZING OR SHORTNESS OF BREATH.   albuterol (2.5 MG/3ML) 0.083% nebulizer solution Commonly known as: PROVENTIL Take 3 mLs (2.5 mg total) by nebulization every 6 (six) hours as needed for wheezing or shortness of breath.   ALPRAZolam 0.5 MG tablet Commonly known as: XANAX Take by mouth.   atomoxetine 40 MG capsule Commonly known as: STRATTERA Take 40 mg by mouth every evening.   Breztri Aerosphere 160-9-4.8 MCG/ACT Aero Generic drug: Budeson-Glycopyrrol-Formoterol Inhale 2 puffs into the lungs in the morning and at bedtime.   diazepam 5 MG tablet Commonly known as: VALIUM SMARTSIG:1 Tablet(s) By Mouth Every 12 Hours PRN   ferrous sulfate 325 (65 FE) MG tablet Take 325 mg by mouth daily.   losartan-hydrochlorothiazide 100-25 MG tablet Commonly known as: HYZAAR TAKE 1 TABLET BY MOUTH EVERY DAY   meloxicam 15 MG tablet Commonly known as: MOBIC Take 15 mg by mouth daily.   montelukast 10 MG tablet Commonly known as: SINGULAIR Take by mouth.   OLANZapine 5 MG tablet Commonly known as: ZYPREXA Take by mouth.   pantoprazole 40 MG tablet Commonly known as: PROTONIX TAKE 1 TABLET BY MOUTH EVERY DAY   pantoprazole 40 MG tablet Commonly known as: PROTONIX Take by mouth.   venlafaxine XR 75 MG 24 hr capsule Commonly known as: EFFEXOR-XR Take 75 mg by mouth daily.         ROS:  Constitutional: negative for chills, fatigue, and fevers Eyes:  negative for visual disturbance and pain Ears, nose, mouth, throat, and face: negative for ear drainage, sore throat, and sinus problems Respiratory: positive for cough, wheezing, and shortness of breath, which she attributes to her asthma Cardiovascular: negative for chest pain and palpitations Gastrointestinal: negative for abdominal pain, nausea, reflux symptoms, and vomiting Genitourinary:negative for dysuria, frequency, and hematuria Integument/breast: negative for rash and boils, dry skin Hematologic/lymphatic: negative for lymphadenopathy and clotting problems Musculoskeletal:positive for neck pain, negative for back pain and stiff joints Neurological: negative for dizziness, tremors, and weakness  Endocrine: negative for temperature intolerance and excessive thirst, diabetes  Blood pressure 129/89, pulse (!) 105, temperature 98.3 F (36.8 C), temperature source Other (Comment), resp. rate 12, height 5\' 4"  (1.626 m), weight 176 lb (79.8 kg), SpO2 93 %. Physical Exam Vitals reviewed.  Constitutional:      Appearance: Normal appearance.  HENT:     Head: Normocephalic and atraumatic.  Eyes:     Extraocular Movements: Extraocular movements intact.     Pupils: Pupils are equal, round, and reactive to light.  Cardiovascular:     Rate and Rhythm: Normal rate and regular rhythm.  Pulmonary:     Effort: Pulmonary effort is normal.     Breath sounds: Normal breath sounds.  Abdominal:     Comments: Soft, nondistended, no percussion tenderness, nontender to palpation  Musculoskeletal:        General: Normal range of motion.     Cervical back: Normal range of motion.  Skin:    General: Skin is warm and dry.  Neurological:     General: No focal deficit present.     Mental Status: She is alert and oriented to person, place, and time.  Psychiatric:        Mood and Affect: Mood normal.        Behavior: Behavior normal.  Rectal:    Patient with nonthrombosed and noninflamed external  hemorrhoids  Results: No results found for this or any previous visit (from the past 48 hour(s)).  No results found.   Assessment & Plan:  LUWANDA PRIDE is a 41 y.o. female who presents with painful external hemorrhoids  -We discussed that hemorrhoidal surgery is a very painful procedure and a last resort option to treat painful hemorrhoids -Patient is agreeable to attempting other conservative measures prior to undergoing hemorrhoidal surgery -Prescription given for [place]aine ointment which can be applied every 3 hours as needed for pain -Also advised patient increase fiber intake to bulk up her stools; information on high-fiber diet and fiber powder given to the patient -Discussed that sitz bath's could also alleviate some of her pain -Follow-up scheduled in 2 weeks to see if patient has improvement in her hemorrhoidal pain  All questions were answered to the satisfaction of the patient.   Graciella Freer, DO [facility] [address] [place], [place] [address] [phone] (office)

## 2021-06-13 ENCOUNTER — Ambulatory Visit: Payer: Medicaid Other | Admitting: Surgery

## 2021-06-29 DIAGNOSIS — D509 Iron deficiency anemia, unspecified: Secondary | ICD-10-CM | POA: Insufficient documentation

## 2021-08-23 DIAGNOSIS — M2012 Hallux valgus (acquired), left foot: Secondary | ICD-10-CM | POA: Insufficient documentation

## 2021-08-23 DIAGNOSIS — M2021 Hallux rigidus, right foot: Secondary | ICD-10-CM | POA: Insufficient documentation

## 2021-08-29 ENCOUNTER — Inpatient Hospital Stay (HOSPITAL_BASED_OUTPATIENT_CLINIC_OR_DEPARTMENT_OTHER)
Admission: EM | Admit: 2021-08-29 | Discharge: 2021-08-31 | DRG: 189 | Disposition: A | Discharge status: Home / Self Care | Payer: Medicaid Other | Attending: Internal Medicine | Admitting: Internal Medicine

## 2021-08-29 ENCOUNTER — Other Ambulatory Visit: Payer: Self-pay

## 2021-08-29 ENCOUNTER — Encounter (HOSPITAL_BASED_OUTPATIENT_CLINIC_OR_DEPARTMENT_OTHER): Payer: Self-pay

## 2021-08-29 ENCOUNTER — Emergency Department (HOSPITAL_BASED_OUTPATIENT_CLINIC_OR_DEPARTMENT_OTHER): Payer: Medicaid Other

## 2021-08-29 DIAGNOSIS — F32A Depression, unspecified: Secondary | ICD-10-CM | POA: Diagnosis not present

## 2021-08-29 DIAGNOSIS — Z832 Family history of diseases of the blood and blood-forming organs and certain disorders involving the immune mechanism: Secondary | ICD-10-CM

## 2021-08-29 DIAGNOSIS — F418 Other specified anxiety disorders: Secondary | ICD-10-CM | POA: Diagnosis present

## 2021-08-29 DIAGNOSIS — F1010 Alcohol abuse, uncomplicated: Secondary | ICD-10-CM | POA: Diagnosis present

## 2021-08-29 DIAGNOSIS — Z791 Long term (current) use of non-steroidal anti-inflammatories (NSAID): Secondary | ICD-10-CM

## 2021-08-29 DIAGNOSIS — I10 Essential (primary) hypertension: Secondary | ICD-10-CM | POA: Diagnosis present

## 2021-08-29 DIAGNOSIS — F419 Anxiety disorder, unspecified: Secondary | ICD-10-CM | POA: Diagnosis not present

## 2021-08-29 DIAGNOSIS — Z789 Other specified health status: Secondary | ICD-10-CM | POA: Diagnosis present

## 2021-08-29 DIAGNOSIS — J45901 Unspecified asthma with (acute) exacerbation: Secondary | ICD-10-CM | POA: Diagnosis present

## 2021-08-29 DIAGNOSIS — F17210 Nicotine dependence, cigarettes, uncomplicated: Secondary | ICD-10-CM | POA: Diagnosis present

## 2021-08-29 DIAGNOSIS — Z720 Tobacco use: Secondary | ICD-10-CM | POA: Diagnosis present

## 2021-08-29 DIAGNOSIS — J9601 Acute respiratory failure with hypoxia: Principal | ICD-10-CM | POA: Diagnosis present

## 2021-08-29 DIAGNOSIS — G43909 Migraine, unspecified, not intractable, without status migrainosus: Secondary | ICD-10-CM | POA: Diagnosis present

## 2021-08-29 DIAGNOSIS — Z20822 Contact with and (suspected) exposure to covid-19: Secondary | ICD-10-CM | POA: Diagnosis present

## 2021-08-29 DIAGNOSIS — J9600 Acute respiratory failure, unspecified whether with hypoxia or hypercapnia: Secondary | ICD-10-CM | POA: Diagnosis present

## 2021-08-29 DIAGNOSIS — Z825 Family history of asthma and other chronic lower respiratory diseases: Secondary | ICD-10-CM

## 2021-08-29 DIAGNOSIS — J441 Chronic obstructive pulmonary disease with (acute) exacerbation: Secondary | ICD-10-CM | POA: Diagnosis present

## 2021-08-29 DIAGNOSIS — Z808 Family history of malignant neoplasm of other organs or systems: Secondary | ICD-10-CM

## 2021-08-29 DIAGNOSIS — Z888 Allergy status to other drugs, medicaments and biological substances status: Secondary | ICD-10-CM

## 2021-08-29 DIAGNOSIS — Z79899 Other long term (current) drug therapy: Secondary | ICD-10-CM

## 2021-08-29 DIAGNOSIS — J4551 Severe persistent asthma with (acute) exacerbation: Secondary | ICD-10-CM | POA: Diagnosis present

## 2021-08-29 DIAGNOSIS — J9621 Acute and chronic respiratory failure with hypoxia: Principal | ICD-10-CM

## 2021-08-29 DIAGNOSIS — K219 Gastro-esophageal reflux disease without esophagitis: Secondary | ICD-10-CM | POA: Diagnosis not present

## 2021-08-29 DIAGNOSIS — Z880 Allergy status to penicillin: Secondary | ICD-10-CM

## 2021-08-29 DIAGNOSIS — Z833 Family history of diabetes mellitus: Secondary | ICD-10-CM | POA: Diagnosis not present

## 2021-08-29 LAB — I-STAT VENOUS BLOOD GAS, ED
Acid-Base Excess: 10 mmol/L — ABNORMAL HIGH (ref 0.0–2.0)
Bicarbonate: 40.7 mmol/L — ABNORMAL HIGH (ref 20.0–28.0)
Calcium, Ion: 1.09 mmol/L — ABNORMAL LOW (ref 1.15–1.40)
HCT: 51 % — ABNORMAL HIGH (ref 36.0–46.0)
Hemoglobin: 17.3 g/dL — ABNORMAL HIGH (ref 12.0–15.0)
O2 Saturation: 65 %
Potassium: 7.3 mmol/L (ref 3.5–5.1)
Sodium: 131 mmol/L — ABNORMAL LOW (ref 135–145)
TCO2: 43 mmol/L — ABNORMAL HIGH (ref 22–32)
pCO2, Ven: 79.2 mmHg (ref 44–60)
pH, Ven: 7.319 (ref 7.25–7.43)
pO2, Ven: 39 mmHg (ref 32–45)

## 2021-08-29 LAB — BASIC METABOLIC PANEL WITH GFR
Anion gap: 10 (ref 5–15)
BUN: 6 mg/dL (ref 6–20)
CO2: 32 mmol/L (ref 22–32)
Calcium: 8.7 mg/dL — ABNORMAL LOW (ref 8.9–10.3)
Chloride: 96 mmol/L — ABNORMAL LOW (ref 98–111)
Creatinine, Ser: 1 mg/dL (ref 0.44–1.00)
GFR, Estimated: >60 mL/min (ref >60)
Glucose, Bld: 104 mg/dL — ABNORMAL HIGH (ref 70–99)
Potassium: 3.7 mmol/L (ref 3.5–5.1)
Sodium: 138 mmol/L (ref 135–145)

## 2021-08-29 LAB — GLUCOSE, CAPILLARY: Glucose-Capillary: 215 mg/dL — ABNORMAL HIGH (ref 70–99)

## 2021-08-29 LAB — CBC
HCT: 53.5 % — ABNORMAL HIGH (ref 36.0–46.0)
Hemoglobin: 16.8 g/dL — ABNORMAL HIGH (ref 12.0–15.0)
MCH: 32.1 pg (ref 26.0–34.0)
MCHC: 31.4 g/dL (ref 30.0–36.0)
MCV: 102.3 fL — ABNORMAL HIGH (ref 80.0–100.0)
Platelets: 270 K/uL (ref 150–400)
RBC: 5.23 MIL/uL — ABNORMAL HIGH (ref 3.87–5.11)
RDW: 16.5 % — ABNORMAL HIGH (ref 11.5–15.5)
WBC: 7.9 K/uL (ref 4.0–10.5)
nRBC: 0 % (ref 0.0–0.2)

## 2021-08-29 LAB — TROPONIN I (HIGH SENSITIVITY)
Troponin I (High Sensitivity): 7 ng/L (ref <18)
Troponin I (High Sensitivity): 5 ng/L (ref <18)

## 2021-08-29 LAB — RESP PANEL BY RT-PCR (FLU A&B, COVID) ARPGX2
Influenza A by PCR: NEGATIVE
Influenza B by PCR: NEGATIVE
SARS Coronavirus 2 by RT PCR: NEGATIVE

## 2021-08-29 MED ORDER — SODIUM CHLORIDE 0.9 % IV BOLUS
1000.0000 mL | Freq: Once | INTRAVENOUS | Status: AC
  Administered 2021-08-29: 1000 mL via INTRAVENOUS

## 2021-08-29 MED ORDER — ALBUTEROL SULFATE (2.5 MG/3ML) 0.083% IN NEBU
INHALATION_SOLUTION | RESPIRATORY_TRACT | Status: AC
  Filled 2021-08-29: qty 6

## 2021-08-29 MED ORDER — IPRATROPIUM BROMIDE 0.02 % IN SOLN
RESPIRATORY_TRACT | Status: AC
  Administered 2021-08-29: 0.5 mg via RESPIRATORY_TRACT
  Filled 2021-08-29: qty 2.5

## 2021-08-29 MED ORDER — ALBUTEROL (5 MG/ML) CONTINUOUS INHALATION SOLN
INHALATION_SOLUTION | RESPIRATORY_TRACT | Status: AC
  Administered 2021-08-29: 20 mg
  Filled 2021-08-29: qty 4

## 2021-08-29 MED ORDER — ALBUTEROL SULFATE (2.5 MG/3ML) 0.083% IN NEBU
5.0000 mg | INHALATION_SOLUTION | Freq: Once | RESPIRATORY_TRACT | Status: AC
  Administered 2021-08-29: 5 mg via RESPIRATORY_TRACT

## 2021-08-29 MED ORDER — ALBUTEROL SULFATE (2.5 MG/3ML) 0.083% IN NEBU: 20.0000 mg | INHALATION_SOLUTION | RESPIRATORY_TRACT | Status: AC

## 2021-08-29 MED ORDER — ALBUTEROL SULFATE (2.5 MG/3ML) 0.083% IN NEBU: 5.0000 mg | INHALATION_SOLUTION | RESPIRATORY_TRACT | Status: DC

## 2021-08-29 MED ORDER — ALBUTEROL SULFATE (2.5 MG/3ML) 0.083% IN NEBU: 5.0000 mg | INHALATION_SOLUTION | RESPIRATORY_TRACT | Status: DC | PRN

## 2021-08-29 MED ORDER — ACETAMINOPHEN 500 MG PO TABS
1000.0000 mg | ORAL_TABLET | Freq: Once | ORAL | Status: DC
Start: 2021-08-29 — End: 2021-08-30
  Filled 2021-08-29: qty 2

## 2021-08-29 MED ORDER — IPRATROPIUM-ALBUTEROL 0.5-2.5 (3) MG/3ML IN SOLN
RESPIRATORY_TRACT | Status: AC
  Administered 2021-08-29: 3 mL via RESPIRATORY_TRACT
  Filled 2021-08-29: qty 3

## 2021-08-29 MED ORDER — IPRATROPIUM-ALBUTEROL 0.5-2.5 (3) MG/3ML IN SOLN: 3.0000 mL | Freq: Once | RESPIRATORY_TRACT | Status: AC

## 2021-08-29 MED ORDER — IPRATROPIUM BROMIDE 0.02 % IN SOLN
0.5000 mg | Freq: Once | RESPIRATORY_TRACT | Status: AC
Start: 2021-08-29 — End: 2021-08-29

## 2021-08-29 MED ORDER — MAGNESIUM SULFATE 2 GM/50ML IV SOLN
2.0000 g | Freq: Once | INTRAVENOUS | Status: AC
  Administered 2021-08-29: 2 g via INTRAVENOUS
  Filled 2021-08-29: qty 50

## 2021-08-29 MED ORDER — ALBUTEROL SULFATE (2.5 MG/3ML) 0.083% IN NEBU: 2.5000 mg | INHALATION_SOLUTION | Freq: Once | RESPIRATORY_TRACT | Status: AC

## 2021-08-29 MED ORDER — LIDOCAINE 5 % EX PTCH
1.0000 | MEDICATED_PATCH | CUTANEOUS | Status: DC
  Administered 2021-08-29: 1 via TRANSDERMAL
  Filled 2021-08-29 (×3): qty 1

## 2021-08-29 MED ORDER — IOHEXOL 350 MG/ML SOLN
80.0000 mL | Freq: Once | INTRAVENOUS | Status: AC | PRN
  Administered 2021-08-29: 80 mL via INTRAVENOUS

## 2021-08-29 MED ORDER — ALBUTEROL SULFATE (2.5 MG/3ML) 0.083% IN NEBU
INHALATION_SOLUTION | RESPIRATORY_TRACT | Status: AC
  Administered 2021-08-29: 2.5 mg via RESPIRATORY_TRACT
  Filled 2021-08-29: qty 3

## 2021-08-29 MED ORDER — METHYLPREDNISOLONE SODIUM SUCC 125 MG IJ SOLR
125.0000 mg | Freq: Once | INTRAMUSCULAR | Status: AC
  Administered 2021-08-29: 125 mg via INTRAVENOUS
  Filled 2021-08-29: qty 2

## 2021-08-29 NOTE — Progress Notes (Signed)
Admitted via medic transport from [facility]. Report from [place], Charity fundraiser. Patient is alert and oriented x 4. Independent with ambulation. On 6L O2 via [place]. Report SOB with ambulation and exertion and 10/10 pain to mid upper back that radiates to sides and lower back with coughing or movement. Has lidocaine patch in place and was given tylenol prior to transfer but is requesting "something stronger" for pain and antianxiety medication. Pt educated on dangers of administering narcotics with low oxygen levels and encouraged to try positioning. Heat packs provided. [facility] on call contacted per pt request. Skin and head to toe assessments completed. Patient oriented to room, staff, and surrounding. Call bell and personal belongings placed in reach.  ?

## 2021-08-29 NOTE — Progress Notes (Signed)
RN has not accepted patient to unit yet as patient is in a yellow mews with a HR of 121 and 88% on 6L currently waiting for reply from ED RN. ? ?

## 2021-08-29 NOTE — H&P (Signed)
?History and Physical  ? ? ?[name] ? ?PCP: Roe RutherfordKeatts, Courtney, NP  ?Patient coming from: Home ? ?I have personally briefly reviewed patient's [facility] ? ?Chief Complaint: Shortness of breath ? ?HPI: ?[name] is a 41 y.o. female with medical history significant for asthma, hypertension, alcohol use, depression/anxiety, tobacco use who presented to the ED for evaluation of shortness of breath. ? ?Patient reports new onset shortness of breath and nonproductive cough beginning morning of 4/12.  She states she does have an infrequent cough at baseline which is worse now.  Coughing has been so frequent that she has developed muscular back pain.  She has had wheezing and has been using her rescue inhaler frequently without improvement.  She states that she does use her albuterol inhaler usually 5 times per day at baseline.  She is not on any maintenance inhalers. ? ?Patient states that she recently went to rehab for alcohol use and has not drank for the last 32 days.  She does admit however that she has increased her tobacco use, up to 2 packs/day since then.  She denies any cocaine or IV drug use. ? ?[facility] ED Course  Labs/Imaging on admission: I have personally reviewed following labs and imaging studies. ? ?Initial vitals showed BP 135/94, pulse 120, RR 18, temp 98.5 ?F, SPO2 80% on room air.  Patient placed on 6 L O2 via [place] with SPO2 94%. ? ?Labs show WBC 7.9, hemoglobin 16.8, platelets 270,000, sodium 138, potassium 3.7, bicarb 32, BUN 6, creatinine 1.00, serum glucose 104, troponin 7 > 5. ? ?VBG showed pH 7.319, PCO2 79.2, PO2 39.  SARS-CoV-2 and influenza PCR negative. ? ?Portable chest x-ray negative for focal consolidation, edema, effusion. ? ?CTA chest PE study negative for evidence of PE.  Mild central bronchial thickening with mild heterogeneous pulmonary parenchyma seen. ? ?Patient was given 1 L  normal saline, IV Solu-Medrol 125 mg, IV magnesium, multiple albuterol, Atrovent, DuoNeb treatments.  The hospitalist service was consulted to admit for further evaluation and management. ? ?Review of Systems: All systems reviewed and are negative except as documented in history of present illness above. ? ? ?Past Medical History:  ?Diagnosis Date  ? Anxiety   ? Asthma   ? Depression   ? doing good, not on meds now  ? GERD (gastroesophageal reflux disease)   ? Headache   ? migraines  ? History of kidney stones   ? Hypertension   ? Kidney stones   ? Missed abortion 10/02/2013  ? Ovarian cyst   ? Pregnancy induced hypertension   ? Suicidal intent   ? Vaginal Pap smear, abnormal   ? bx, f/u ok  ? ? ?Past Surgical History:  ?Procedure Laterality Date  ? CESAREAN SECTION    ? CESAREAN SECTION N/A 06/06/2015  ? Procedure: CESAREAN SECTION;  Surgeon: Kathreen CosierBernard A Marshall, MD;  Location: [facility] ORS;  Service: Obstetrics;  Laterality: N/A;  ? CHOLECYSTECTOMY N/A 08/23/2016  ? Procedure: LAPAROSCOPIC CHOLECYSTECTOMY;  Surgeon: Berna Buehelsea A Connor, MD;  Location: [facility] OR;  Service: General;  Laterality: N/A;  ? DILATION AND CURETTAGE OF UTERUS    ? DILATION AND EVACUATION N/A 10/04/2013  ? Procedure: DILATATION AND EVACUATION;  Surgeon: Sherian ReinJody Bovard-Stuckert, MD;  Location: [facility] ORS;  Service: Gynecology;  Laterality: N/A;  US in room   ? TONSILLECTOMY    ? TUBAL LIGATION    ? ? ?Social History: ?  reports that she has been smoking cigarettes. She has a 3.75 pack-year smoking history. She has never used smokeless tobacco. She reports current alcohol use of about 4.0 standard drinks per week. She reports that she does not use drugs. ? ?Allergies  ?Allergen Reactions  ? Penicillins Nausea And Vomiting and Other (See Comments)  ?  UNSPECIFIED REACTION  ?Has patient had a PCN reaction causing immediate rash, facial/tongue/throat swelling, SOB or lightheadedness with hypotension:No ?Has patient had a PCN reaction causing severe rash involving mucus  membranes or skin necrosis:No ?Has patient had a PCN reaction that REACTION THAT REQUIRED HOSPITALIZATION:  #  #  #  YES  #  #  #  ?Has patient had a PCN reaction occurring within the last 10 years: #  #  #  YES  #  #  #   ? Norvasc [Amlodipine Besylate] Other (See Comments)  ?  Hypersensitivity? Tip of tongue numb and flushing.  ? ? ?Family History  ?Problem Relation Age of Onset  ? Hearing loss Mother   ? Asthma Mother   ? Asthma Father   ? Diabetes Son   ? Cancer Maternal Grandfather   ?     bone  ? Liver disease Maternal Grandfather   ? Asthma Sister   ? Lupus Sister   ? ? ? ?Prior to Admission medications   ?Medication Sig Start Date End Date Taking? Authorizing Provider  ?albuterol (PROAIR HFA) 108 (90 Base) MCG/ACT inhaler INHALE 1-2 PUFFS INTO THE LUNGS EVERY 6 (SIX) HOURS AS NEEDED FOR WHEEZING OR SHORTNESS OF BREATH. 12/14/20   Worthy Rancher B, FNP  ?albuterol (PROVENTIL) (2.5 MG/3ML) 0.083% nebulizer solution Take 3 mLs (2.5 mg total) by nebulization every 6 (six) hours as needed for wheezing or shortness of breath. 03/27/21   Charlott Holler, MD  ?ALPRAZolam Prudy Feeler) 0.5 MG tablet Take by mouth. 01/01/21   [provider]  ?atomoxetine (STRATTERA) 40 MG capsule Take 40 mg by mouth every evening.     [provider]  ?benzocaine (AMERICAINE) 20 % rectal ointment Place rectally every 3 (three) hours as needed for pain. 05/29/21   Pappayliou, Gustavus Messing, DO  ?Budeson-Glycopyrrol-Formoterol (BREZTRI AEROSPHERE) 160-9-4.8 MCG/ACT AERO Inhale 2 puffs into the lungs in the morning and at bedtime. 03/27/21   Charlott Holler, MD  ?diazepam (VALIUM) 5 MG tablet SMARTSIG:1 Tablet(s) By Mouth Every 12 Hours PRN 03/20/21   [provider]  ?ferrous sulfate 325 (65 FE) MG tablet Take 325 mg by mouth daily. 12/01/20   [provider]  ?losartan-hydrochlorothiazide (HYZAAR) 100-25 MG tablet TAKE 1 TABLET BY MOUTH EVERY DAY 11/10/20   Hoy Register, MD  ?meloxicam (MOBIC) 15 MG tablet Take  15 mg by mouth daily. 02/25/21   [provider]  ?montelukast (SINGULAIR) 10 MG tablet Take by mouth. 12/15/20   [provider]  ?OLANZapine (ZYPREXA) 5 MG tablet Take by mouth.    [provider]  ?pantoprazole (PROTONIX) 40 MG tablet TAKE 1 TABLET BY MOUTH EVERY DAY 12/14/20   Charlott Holler, MD  ?pantoprazole (PROTONIX) 40 MG tablet Take by mouth. 01/19/21   [provider]  ?venlafaxine XR (EFFEXOR-XR) 75 MG 24 hr capsule Take 75 mg by mouth daily. 03/19/21   [provider]  ? ? ?Physical Exam: ?Vitals:  ? 08/29/21 2004 08/29/21 2101 08/29/21 2241 08/29/21 2344  ?BP:  140/78  136/89  ?Pulse:  (!) 121  (!) 114  ?Resp:  (!) 25  17  ?  Temp:  99.8 ?F (37.7 ?C) 98.4 ?F (36.9 ?C) 98.1 ?F (36.7 ?C)  ?TempSrc:  Tympanic Oral Axillary  ?SpO2: 91% (!) 88%  90%  ?Weight:   80.7 kg   ?Height:   5\' 4"  (1.626 m)   ? ?Constitutional: Resting in bed, NAD, calm, comfortable ?Eyes: PERRL, lids and conjunctivae normal ?ENMT: Mucous membranes are moist. Posterior pharynx clear of any exudate or lesions.Normal dentition.  ?Neck: normal, supple, no masses. ?Respiratory: Expiratory wheezing throughout. Normal respiratory effort while on 6 L O2 via [place]. No accessory muscle use.  ?Cardiovascular: Regular rate and rhythm, no murmurs / rubs / gallops. No extremity edema. 2+ pedal pulses. ?Abdomen: no tenderness, no masses palpated. No hepatosplenomegaly. Bowel sounds positive.  ?Musculoskeletal: no clubbing / cyanosis. No joint deformity upper and lower extremities. Good ROM, no contractures. Normal muscle tone.  ?Skin: no rashes, lesions, ulcers. No induration ?Neurologic: CN 2-12 grossly intact. Sensation intact. Strength 5/5 in all 4.  ?Psychiatric: Alert and oriented x 3. Normal mood.  ? ?EKG: Personally reviewed. Sinus tachycardia, rate 114, biatrial enlargement.  Rate is faster when compared to prior. ? ?Assessment/Plan ?Principal Problem: ?  Acute respiratory failure with hypoxia  (HCC) ?Active Problems: ?  Asthma, chronic, unspecified asthma severity, with acute exacerbation ?  Hypertension ?  Depression with anxiety ?  Alcohol use ?  Tobacco use ?  ?[name] is a 41 y.o. female with

## 2021-08-29 NOTE — ED Provider Notes (Signed)
?[facility] EMERGENCY DEPARTMENT ?Provider Note ? ? ?CSN: [id number] ?Arrival date & time: 08/29/21  1628 ? ?  ? ?History ?PMH: smoker, asthma, HTN ?Chief Complaint  ?Patient presents with  ? Shortness of Breath  ? ? ?[name] is a 41 y.o. female. ?Presents the ED with shortness of breath.  She states over the past 4 days she has had a productive cough with green sputum.  She was seen by her primary care and put on cough medication.  She did not get any relief from this.  She says yesterday she started noticing some chest pain that radiates into her mid back.  It is worse with deep inspirations.  She states that she is felt a little bit wheezy the past couple of days.  She denies any fevers but has had chills over the past couple of days.  Denies history of blood clot.  No recent surgery.  No hemoptysis. ? ?Shortness of Breath ?Associated symptoms: chest pain and cough   ?Associated symptoms: no fever, no sore throat and no vomiting   ? ?  ? ?Home Medications ?Prior to Admission medications   ?Medication Sig Start Date End Date Taking? Authorizing Provider  ?albuterol (PROAIR HFA) 108 (90 Base) MCG/ACT inhaler INHALE 1-2 PUFFS INTO THE LUNGS EVERY 6 (SIX) HOURS AS NEEDED FOR WHEEZING OR SHORTNESS OF BREATH. 12/14/20   Worthy RancherWebb, Padonda B, FNP  ?albuterol (PROVENTIL) (2.5 MG/3ML) 0.083% nebulizer solution Take 3 mLs (2.5 mg total) by nebulization every 6 (six) hours as needed for wheezing or shortness of breath. 03/27/21   Charlott Holleresai, Nikita S, MD  ?ALPRAZolam Prudy Feeler(XANAX) 0.5 MG tablet Take by mouth. 01/01/21   [provider]  ?atomoxetine (STRATTERA) 40 MG capsule Take 40 mg by mouth every evening.     [provider]  ?benzocaine (AMERICAINE) 20 % rectal ointment Place rectally every 3 (three) hours as needed for pain. 05/29/21   Pappayliou, Gustavus Messingatherine A, DO  ?Budeson-Glycopyrrol-Formoterol (BREZTRI AEROSPHERE) 160-9-4.8 MCG/ACT AERO Inhale 2 puffs into the lungs in the morning and at bedtime.  03/27/21   Charlott Holleresai, Nikita S, MD  ?diazepam (VALIUM) 5 MG tablet SMARTSIG:1 Tablet(s) By Mouth Every 12 Hours PRN 03/20/21   [provider]  ?ferrous sulfate 325 (65 FE) MG tablet Take 325 mg by mouth daily. 12/01/20   [provider]  ?losartan-hydrochlorothiazide (HYZAAR) 100-25 MG tablet TAKE 1 TABLET BY MOUTH EVERY DAY 11/10/20   Hoy RegisterNewlin, Enobong, MD  ?meloxicam (MOBIC) 15 MG tablet Take 15 mg by mouth daily. 02/25/21   [provider]  ?montelukast (SINGULAIR) 10 MG tablet Take by mouth. 12/15/20   [provider]  ?OLANZapine (ZYPREXA) 5 MG tablet Take by mouth.    [provider]  ?pantoprazole (PROTONIX) 40 MG tablet TAKE 1 TABLET BY MOUTH EVERY DAY 12/14/20   Charlott Holleresai, Nikita S, MD  ?pantoprazole (PROTONIX) 40 MG tablet Take by mouth. 01/19/21   [provider]  ?venlafaxine XR (EFFEXOR-XR) 75 MG 24 hr capsule Take 75 mg by mouth daily. 03/19/21   [provider]  ?   ? ?Allergies    ?Penicillins and Norvasc [amlodipine besylate]   ? ?Review of Systems   ?Review of Systems  ?Constitutional:  Positive for chills. Negative for fever.  ?HENT:  Negative for congestion, rhinorrhea and sore throat.   ?Respiratory:  Positive for cough and shortness of breath.   ?Cardiovascular:  Positive for chest pain. Negative for leg swelling.  ?Gastrointestinal:  Negative for nausea and vomiting.  ?  Musculoskeletal:  Positive for back pain.  ?All other systems reviewed and are negative. ? ?Physical Exam ?Updated Vital Signs ?BP (!) 140/93   Pulse (!) 108   Temp 98.5 ?F (36.9 ?C) (Oral)   Resp 18   Ht 5\' 4"  (1.626 m)   Wt 80.3 kg   SpO2 92%   BMI 30.38 kg/m?  ?Physical Exam ?Vitals and nursing note reviewed.  ?Constitutional:   ?   General: She is not in acute distress. ?   Appearance: Normal appearance. She is not ill-appearing, toxic-appearing or diaphoretic.  ?HENT:  ?   Head: Normocephalic and atraumatic.  ?   Nose: No nasal deformity.  ?   Mouth/Throat:  ?   Lips:  Pink. No lesions.  ?   Mouth: Mucous membranes are moist. No injury, lacerations, oral lesions or angioedema.  ?   Pharynx: Oropharynx is clear. Uvula midline. No pharyngeal swelling, oropharyngeal exudate, posterior oropharyngeal erythema or uvula swelling.  ?Eyes:  ?   General: Gaze aligned appropriately. No scleral icterus.    ?   Right eye: No discharge.     ?   Left eye: No discharge.  ?   Conjunctiva/sclera: Conjunctivae normal.  ?   Right eye: Right conjunctiva is not injected. No exudate or hemorrhage. ?   Left eye: Left conjunctiva is not injected. No exudate or hemorrhage. ?   Pupils: Pupils are equal, round, and reactive to light.  ?Cardiovascular:  ?   Rate and Rhythm: Normal rate and regular rhythm.  ?   Pulses: Normal pulses.     ?     Radial pulses are 2+ on the right side and 2+ on the left side.  ?     Dorsalis pedis pulses are 2+ on the right side and 2+ on the left side.  ?   Heart sounds: Normal heart sounds, S1 normal and S2 normal. Heart sounds not distant. No murmur heard. ?  No friction rub. No gallop. No S3 or S4 sounds.  ?Pulmonary:  ?   Effort: Pulmonary effort is normal. Tachypnea present. No accessory muscle usage or respiratory distress.  ?   Breath sounds: No stridor. Examination of the right-upper field reveals decreased breath sounds. Examination of the left-upper field reveals decreased breath sounds. Examination of the right-middle field reveals decreased breath sounds. Examination of the left-middle field reveals decreased breath sounds. Examination of the right-lower field reveals decreased breath sounds. Decreased breath sounds present. No wheezing, rhonchi or rales.  ?   Comments: Productive cough on exam ?Chest:  ?   Chest wall: No tenderness.  ?Abdominal:  ?   General: Abdomen is flat. Bowel sounds are normal. There is no distension.  ?   Palpations: Abdomen is soft. There is no mass or pulsatile mass.  ?   Tenderness: There is no abdominal tenderness. There is no guarding or  rebound.  ?Musculoskeletal:  ?   Right lower leg: No edema.  ?   Left lower leg: No edema.  ?Skin: ?   General: Skin is warm and dry.  ?   Coloration: Skin is not jaundiced or pale.  ?   Findings: No bruising, erythema, lesion or rash.  ?Neurological:  ?   General: No focal deficit present.  ?   Mental Status: She is alert and oriented to person, place, and time.  ?   GCS: GCS eye subscore is 4. GCS verbal subscore is 5. GCS motor subscore is 6.  ?Psychiatric:     ?  Mood and Affect: Mood normal.     ?   Behavior: Behavior normal. Behavior is cooperative.  ? ? ?ED Results / Procedures / Treatments   ?Labs ?(all labs ordered are listed, but only abnormal results are displayed) ?Labs Reviewed  ?BASIC METABOLIC PANEL - Abnormal; Notable for the following components:  ?    Result Value  ? Chloride 96 (*)   ? Glucose, Bld 104 (*)   ? Calcium 8.7 (*)   ? All other components within normal limits  ?CBC - Abnormal; Notable for the following components:  ? RBC 5.23 (*)   ? Hemoglobin 16.8 (*)   ? HCT 53.5 (*)   ? MCV 102.3 (*)   ? RDW 16.5 (*)   ? All other components within normal limits  ?I-STAT VENOUS BLOOD GAS, ED - Abnormal; Notable for the following components:  ? pCO2, Ven 79.2 (*)   ? Bicarbonate 40.7 (*)   ? TCO2 43 (*)   ? Acid-Base Excess 10.0 (*)   ? Sodium 131 (*)   ? Potassium 7.3 (*)   ? Calcium, Ion 1.09 (*)   ? HCT 51.0 (*)   ? Hemoglobin 17.3 (*)   ? All other components within normal limits  ?RESP PANEL BY RT-PCR (FLU A&B, COVID) [id number]  ?PREGNANCY, URINE  ?TROPONIN I (HIGH SENSITIVITY)  ?TROPONIN I (HIGH SENSITIVITY)  ? ? ?EKG ?EKG Interpretation ? ?Date/Time:  Wednesday August 29 2021 16:45:53 EDT ?Ventricular Rate:  114 ?PR Interval:  177 ?QRS Duration: 78 ?QT Interval:  311 ?QTC Calculation: 429 ?R Axis:   78 ?Text Interpretation: Sinus tachycardia Biatrial enlargement Low voltage, precordial leads No significant change since last tracing Confirmed by Richardean Canal [id number]) on 08/29/2021 4:56:35  PM ? ?Radiology ?CT Angio Chest PE W and/or Wo Contrast ? ?Result Date: 08/29/2021 ?CLINICAL DATA:  Pulmonary embolism (PE) suspected, high prob Shortness of breath, chest pain, dizziness, mid back pain. Pain worse with d

## 2021-08-29 NOTE — ED Notes (Signed)
[facility] is here for transport.  

## 2021-08-29 NOTE — ED Notes (Signed)
Patient transported to CT 

## 2021-08-29 NOTE — ED Notes (Signed)
Up to [facility] in room, SpO2 dropped to 88% with exertion, denies increased WOB. ?

## 2021-08-29 NOTE — ED Triage Notes (Signed)
C/o shortness of breath, chest pain, dizziness, mid back pain x 2 days. Pain worse with deep breath.  ?

## 2021-08-29 NOTE — ED Notes (Signed)
Pt noted to be 80% on RA, RT called to triage, placed on 6L O2 via [place], coughing, diminished per Brett Canales, RT. ?

## 2021-08-30 ENCOUNTER — Encounter (HOSPITAL_COMMUNITY): Payer: Self-pay | Admitting: Internal Medicine

## 2021-08-30 DIAGNOSIS — J9601 Acute respiratory failure with hypoxia: Secondary | ICD-10-CM | POA: Diagnosis present

## 2021-08-30 DIAGNOSIS — Z833 Family history of diabetes mellitus: Secondary | ICD-10-CM | POA: Diagnosis not present

## 2021-08-30 DIAGNOSIS — Z789 Other specified health status: Secondary | ICD-10-CM | POA: Diagnosis present

## 2021-08-30 DIAGNOSIS — F32A Depression, unspecified: Secondary | ICD-10-CM | POA: Diagnosis present

## 2021-08-30 DIAGNOSIS — Z20822 Contact with and (suspected) exposure to covid-19: Secondary | ICD-10-CM | POA: Diagnosis present

## 2021-08-30 DIAGNOSIS — Z79899 Other long term (current) drug therapy: Secondary | ICD-10-CM | POA: Diagnosis not present

## 2021-08-30 DIAGNOSIS — G43909 Migraine, unspecified, not intractable, without status migrainosus: Secondary | ICD-10-CM | POA: Diagnosis present

## 2021-08-30 DIAGNOSIS — K219 Gastro-esophageal reflux disease without esophagitis: Secondary | ICD-10-CM | POA: Diagnosis present

## 2021-08-30 DIAGNOSIS — F17210 Nicotine dependence, cigarettes, uncomplicated: Secondary | ICD-10-CM | POA: Diagnosis present

## 2021-08-30 DIAGNOSIS — Z791 Long term (current) use of non-steroidal anti-inflammatories (NSAID): Secondary | ICD-10-CM | POA: Diagnosis not present

## 2021-08-30 DIAGNOSIS — J441 Chronic obstructive pulmonary disease with (acute) exacerbation: Secondary | ICD-10-CM | POA: Diagnosis present

## 2021-08-30 DIAGNOSIS — Z825 Family history of asthma and other chronic lower respiratory diseases: Secondary | ICD-10-CM | POA: Diagnosis not present

## 2021-08-30 DIAGNOSIS — I10 Essential (primary) hypertension: Secondary | ICD-10-CM | POA: Diagnosis present

## 2021-08-30 DIAGNOSIS — Z880 Allergy status to penicillin: Secondary | ICD-10-CM | POA: Diagnosis not present

## 2021-08-30 DIAGNOSIS — J45901 Unspecified asthma with (acute) exacerbation: Secondary | ICD-10-CM | POA: Diagnosis present

## 2021-08-30 DIAGNOSIS — Z888 Allergy status to other drugs, medicaments and biological substances status: Secondary | ICD-10-CM | POA: Diagnosis not present

## 2021-08-30 DIAGNOSIS — Z720 Tobacco use: Secondary | ICD-10-CM | POA: Diagnosis present

## 2021-08-30 DIAGNOSIS — Z808 Family history of malignant neoplasm of other organs or systems: Secondary | ICD-10-CM | POA: Diagnosis not present

## 2021-08-30 DIAGNOSIS — F419 Anxiety disorder, unspecified: Secondary | ICD-10-CM | POA: Diagnosis present

## 2021-08-30 DIAGNOSIS — F1010 Alcohol abuse, uncomplicated: Secondary | ICD-10-CM | POA: Diagnosis present

## 2021-08-30 DIAGNOSIS — J9600 Acute respiratory failure, unspecified whether with hypoxia or hypercapnia: Secondary | ICD-10-CM | POA: Diagnosis present

## 2021-08-30 DIAGNOSIS — Z832 Family history of diseases of the blood and blood-forming organs and certain disorders involving the immune mechanism: Secondary | ICD-10-CM | POA: Diagnosis not present

## 2021-08-30 LAB — BASIC METABOLIC PANEL
Anion gap: 6 (ref 5–15)
BUN: <5 mg/dL — ABNORMAL LOW (ref 6–20)
CO2: 32 mmol/L (ref 22–32)
Calcium: 8.4 mg/dL — ABNORMAL LOW (ref 8.9–10.3)
Chloride: 98 mmol/L (ref 98–111)
Creatinine, Ser: 0.91 mg/dL (ref 0.44–1.00)
GFR, Estimated: >60 mL/min (ref >60)
Glucose, Bld: 138 mg/dL — ABNORMAL HIGH (ref 70–99)
Potassium: 4.4 mmol/L (ref 3.5–5.1)
Sodium: 136 mmol/L (ref 135–145)

## 2021-08-30 LAB — CBC
HCT: 47.8 % — ABNORMAL HIGH (ref 36.0–46.0)
Hemoglobin: 14.9 g/dL (ref 12.0–15.0)
MCH: 31.9 pg (ref 26.0–34.0)
MCHC: 31.2 g/dL (ref 30.0–36.0)
MCV: 102.4 fL — ABNORMAL HIGH (ref 80.0–100.0)
Platelets: 247 10*3/uL (ref 150–400)
RBC: 4.67 MIL/uL (ref 3.87–5.11)
RDW: 16.4 % — ABNORMAL HIGH (ref 11.5–15.5)
WBC: 7.1 10*3/uL (ref 4.0–10.5)
nRBC: 0 % (ref 0.0–0.2)

## 2021-08-30 LAB — HIV ANTIBODY (ROUTINE TESTING W REFLEX): HIV Screen 4th Generation wRfx: NONREACTIVE

## 2021-08-30 MED ORDER — VENLAFAXINE HCL ER 75 MG PO CP24
150.0000 mg | ORAL_CAPSULE | Freq: Every day | ORAL | Status: DC
  Administered 2021-08-30 – 2021-08-31 (×2): 150 mg via ORAL
  Filled 2021-08-30 (×2): qty 2

## 2021-08-30 MED ORDER — IPRATROPIUM-ALBUTEROL 0.5-2.5 (3) MG/3ML IN SOLN: 3.0000 mL | Freq: Two times a day (BID) | RESPIRATORY_TRACT | Status: DC

## 2021-08-30 MED ORDER — LOSARTAN POTASSIUM-HCTZ 100-25 MG PO TABS: 1.0000 | ORAL_TABLET | Freq: Every day | ORAL | Status: DC

## 2021-08-30 MED ORDER — LOSARTAN POTASSIUM 50 MG PO TABS
100.0000 mg | ORAL_TABLET | Freq: Every day | ORAL | Status: DC
  Administered 2021-08-30 – 2021-08-31 (×2): 100 mg via ORAL
  Filled 2021-08-30 (×2): qty 2

## 2021-08-30 MED ORDER — IPRATROPIUM-ALBUTEROL 0.5-2.5 (3) MG/3ML IN SOLN
3.0000 mL | Freq: Four times a day (QID) | RESPIRATORY_TRACT | Status: DC
  Administered 2021-08-30 (×2): 3 mL via RESPIRATORY_TRACT
  Filled 2021-08-30 (×2): qty 3

## 2021-08-30 MED ORDER — GUAIFENESIN ER 600 MG PO TB12: 600.0000 mg | ORAL_TABLET | Freq: Two times a day (BID) | ORAL | Status: DC | PRN

## 2021-08-30 MED ORDER — ENOXAPARIN SODIUM 40 MG/0.4ML IJ SOSY
40.0000 mg | PREFILLED_SYRINGE | INTRAMUSCULAR | Status: DC
  Administered 2021-08-30: 40 mg via SUBCUTANEOUS
  Filled 2021-08-30: qty 0.4

## 2021-08-30 MED ORDER — SENNOSIDES-DOCUSATE SODIUM 8.6-50 MG PO TABS: 1.0000 | ORAL_TABLET | Freq: Every evening | ORAL | Status: DC | PRN

## 2021-08-30 MED ORDER — MONTELUKAST SODIUM 10 MG PO TABS
10.0000 mg | ORAL_TABLET | Freq: Every day | ORAL | Status: DC
  Administered 2021-08-30: 10 mg via ORAL
  Filled 2021-08-30: qty 1

## 2021-08-30 MED ORDER — BUSPIRONE HCL 10 MG PO TABS
10.0000 mg | ORAL_TABLET | Freq: Three times a day (TID) | ORAL | Status: DC
Start: 2021-08-30 — End: 2021-08-31
  Administered 2021-08-30 – 2021-08-31 (×2): 10 mg via ORAL
  Filled 2021-08-30 (×2): qty 1

## 2021-08-30 MED ORDER — PREDNISONE 20 MG PO TABS
40.0000 mg | ORAL_TABLET | Freq: Every day | ORAL | Status: DC
  Administered 2021-08-31: 40 mg via ORAL
  Filled 2021-08-30: qty 2

## 2021-08-30 MED ORDER — ACETAMINOPHEN 650 MG RE SUPP: 650.0000 mg | Freq: Four times a day (QID) | RECTAL | Status: DC | PRN

## 2021-08-30 MED ORDER — METHOCARBAMOL 500 MG PO TABS
500.0000 mg | ORAL_TABLET | Freq: Three times a day (TID) | ORAL | Status: DC | PRN
  Administered 2021-08-30 (×2): 500 mg via ORAL
  Filled 2021-08-30 (×2): qty 1

## 2021-08-30 MED ORDER — HYDROCHLOROTHIAZIDE 25 MG PO TABS
25.0000 mg | ORAL_TABLET | Freq: Every day | ORAL | Status: DC
  Administered 2021-08-30 – 2021-08-31 (×2): 25 mg via ORAL
  Filled 2021-08-30 (×2): qty 1

## 2021-08-30 MED ORDER — THIAMINE HCL 100 MG PO TABS
100.0000 mg | ORAL_TABLET | Freq: Every day | ORAL | Status: DC
  Administered 2021-08-30 – 2021-08-31 (×2): 100 mg via ORAL
  Filled 2021-08-30 (×2): qty 1

## 2021-08-30 MED ORDER — OLANZAPINE 10 MG PO TABS
10.0000 mg | ORAL_TABLET | Freq: Every day | ORAL | Status: DC
  Administered 2021-08-30 (×2): 10 mg via ORAL
  Filled 2021-08-30 (×3): qty 1

## 2021-08-30 MED ORDER — DIAZEPAM 5 MG PO TABS
10.0000 mg | ORAL_TABLET | Freq: Every day | ORAL | Status: DC | PRN
  Administered 2021-08-30: 10 mg via ORAL
  Filled 2021-08-30: qty 2

## 2021-08-30 MED ORDER — ACETAMINOPHEN 325 MG PO TABS: 650.0000 mg | ORAL_TABLET | Freq: Four times a day (QID) | ORAL | Status: DC | PRN

## 2021-08-30 MED ORDER — ONDANSETRON HCL 4 MG PO TABS: 4.0000 mg | ORAL_TABLET | Freq: Four times a day (QID) | ORAL | Status: DC | PRN

## 2021-08-30 MED ORDER — IPRATROPIUM-ALBUTEROL 0.5-2.5 (3) MG/3ML IN SOLN
3.0000 mL | Freq: Three times a day (TID) | RESPIRATORY_TRACT | Status: DC
  Administered 2021-08-30 – 2021-08-31 (×2): 3 mL via RESPIRATORY_TRACT
  Filled 2021-08-30 (×2): qty 3

## 2021-08-30 MED ORDER — ALBUTEROL SULFATE (2.5 MG/3ML) 0.083% IN NEBU: 2.5000 mg | INHALATION_SOLUTION | RESPIRATORY_TRACT | Status: DC | PRN

## 2021-08-30 MED ORDER — IBUPROFEN 400 MG PO TABS
400.0000 mg | ORAL_TABLET | Freq: Four times a day (QID) | ORAL | Status: DC | PRN
  Administered 2021-08-30 (×3): 400 mg via ORAL
  Filled 2021-08-30 (×3): qty 1

## 2021-08-30 MED ORDER — ONDANSETRON HCL 4 MG/2ML IJ SOLN
4.0000 mg | Freq: Four times a day (QID) | INTRAMUSCULAR | Status: DC | PRN
Start: 2021-08-30 — End: 2021-08-31

## 2021-08-30 MED ORDER — FOLIC ACID 1 MG PO TABS
1.0000 mg | ORAL_TABLET | Freq: Every day | ORAL | Status: DC
  Administered 2021-08-30 – 2021-08-31 (×2): 1 mg via ORAL
  Filled 2021-08-30 (×2): qty 1

## 2021-08-30 MED ORDER — NICOTINE POLACRILEX 2 MG MT GUM
2.0000 mg | CHEWING_GUM | OROMUCOSAL | Status: DC | PRN
  Administered 2021-08-30: 2 mg via ORAL
  Filled 2021-08-30 (×2): qty 1

## 2021-08-30 MED ORDER — SODIUM CHLORIDE 0.9% FLUSH
3.0000 mL | Freq: Two times a day (BID) | INTRAVENOUS | Status: DC
  Administered 2021-08-30 – 2021-08-31 (×3): 3 mL via INTRAVENOUS

## 2021-08-30 MED ORDER — METHYLPREDNISOLONE SODIUM SUCC 40 MG IJ SOLR
40.0000 mg | Freq: Two times a day (BID) | INTRAMUSCULAR | Status: AC
  Administered 2021-08-30 (×2): 40 mg via INTRAVENOUS
  Filled 2021-08-30 (×2): qty 1

## 2021-08-30 NOTE — Assessment & Plan Note (Signed)
Continue home Zyprexa, Effexor XR, diazepam 10 mg daily as needed. ?

## 2021-08-30 NOTE — Assessment & Plan Note (Signed)
Continue losartan/HCTZ 

## 2021-08-30 NOTE — Progress Notes (Signed)
Mobility Specialist Progress Note ? ? 08/30/21 1610  ?Mobility  ?Activity Ambulated independently in hallway  ?Level of Assistance Independent after set-up  ?Assistive Device None  ?Distance Ambulated (ft) 470 ft  ?Activity Response Tolerated well  ?$Mobility charge 1 Mobility  ? ?Pre Mobility: 95% SpO2 on 5LO2 ?During Mobility: 90% SpO2 on 6LO2 ?Post Mobility: 96% SpO2 on 5LO2 ? ?Received pt on 5LO2 in bed w/ no complaints and agreeable. No physical assist needed throughout but pt hovering around 89% - 93% SpO2 on 6LO2 while ambulating. X1 standing break to blow nose but no signs of SOB or DOE. Returned back to bed and placed back on 5LO2 and put call bell in reach.  ? ?Holland Falling ?Mobility Specialist ?Phone Number [phone] ? ?

## 2021-08-30 NOTE — Assessment & Plan Note (Signed)
Patient reports cessation after recent rehab stay, denies any alcohol use last 32 days prior to admission. ?

## 2021-08-30 NOTE — Assessment & Plan Note (Signed)
Acute asthma exacerbation ?Patient hypoxic with SPO2 80% on room air, currently requiring 6 L O2 via [place].  She does not require supplemental O2 at home at baseline.  Still has significant wheezing on exam despite initial treatment in the ED.  Asthma likely triggered by increased tobacco use.  CTA negative for evidence of PE, pneumonia, edema. ?-Start IV Solu-Medrol 40 mg twice daily ?-Start on scheduled DuoNebs with albuterol as needed ?-Continue Singulair ?-Continue supplemental oxygen as needed and wean as able ?

## 2021-08-30 NOTE — Hospital Course (Signed)
[name] is a 41 y.o. female with medical history significant for asthma, hypertension, alcohol use, depression/anxiety, tobacco use who is admitted with acute hypoxic respiratory failure due to asthma exacerbation. ?

## 2021-08-30 NOTE — Assessment & Plan Note (Signed)
Patient admits to increased use up to 2 packs/day since quitting alcohol.  She is requesting nicotine gum which is ordered. ?

## 2021-08-30 NOTE — Progress Notes (Signed)
? [name]  GQB:[id number] DOB: 1980-12-28 DOA: 08/29/2021 ?PCP: Roe Rutherford, NP   ? ?Brief Narrative:  ?41 year old with a history of asthma, prior history of alcohol abuse abstaining x32 days post rehab, HTN, depression/anxiety, and tobacco abuse in the amount of 2 packs/day who presented to the ED with severe shortness of breath for approximately 18 hours.  This was associated with frequent coughing leading to muscular pain and chest soreness.  Her rescue inhaler did not improve her symptoms.  At [facility] she was found to have an initial heart rate of 120 with O2 saturations 80% on room air.  She required 6 L nasal cannula to improve her saturation to 94%.  CXR was without evidence of focal infiltrate edema or effusion.  CTa was negative for pulmonary embolism. ? ? ?Consultants:  ?None ? ?Code Status: FULL CODE ? ?DVT prophylaxis: ?Lovenox ? ?Interim Hx: ?Afebrile.  Vital signs stable.  Requiring salter high flow nasal cannula at 5 L to keep saturations 90-91%.  Reports that she is feeling better overall at the time of visit, but continues to require oxygen support and is wheezing.  Is able to complete full sentences.  Is not in extremis but is not yet at her baseline.  Denies chest pain nausea vomiting abdominal pain. ? ?Assessment & Plan: ? ?Acute hypoxic respiratory failure - acute asthma exacerbation ?Oxygen saturation 80% on room air at presentation with increased work of breathing and tachypnea with subjective dyspnea -does not require oxygen at baseline -significant wheezing appreciable at time of presentation -continue steroid and bronchodilator therapy -making progress but not yet stable enough for discharge home -weaned to room air ? ?HTN ?Continue losartan/HCTZ ? ?Depression/anxiety ?Continue usual Zyprexa, Effexor, and diazepam ? ?Heavy tobacco abuse ?Patient smoking 2 packs/day since quitting alcohol -counseled on absolute need to discontinue tobacco abuse ? ?Alcohol  abuse ?Recently completed a rehab stay and has abstained for 32 days -patient has been congratulated ? ? ?Family Communication: No family present at time of exam ?Disposition: Anticipate discharge home 4/14 if respiratory status improved ? ?Objective: ?Blood pressure 130/77, pulse 96, temperature 98 ?F (36.7 ?C), temperature source Oral, resp. rate 17, height 5\' 4"  (1.626 m), weight 80.7 kg, SpO2 91 %. ?No intake or output data in the 24 hours ending 08/30/21 0939 ?Filed Weights  ? 08/29/21 1635 08/29/21 2241  ?Weight: 80.3 kg 80.7 kg  ? ? ?Examination: ?General: Mild respiratory distress evidenced by dyspnea and need for oxygen support ?Lungs: Prolonged expiratory phase with diffuse expiratory wheeze with no focal crackles ?Cardiovascular: Regular rate and rhythm without murmur gallop or rub normal S1 and S2 ?Abdomen: Nontender, nondistended, soft, bowel sounds positive, no rebound, no ascites, no appreciable mass ?Extremities: No significant cyanosis, clubbing, or edema bilateral lower extremities ? ?CBC: ?Recent Labs  ?Lab 08/29/21 ?1644 08/29/21 ?1730 08/30/21 ?0250  ?WBC 7.9  --  7.1  ?HGB 16.8* 17.3* 14.9  ?HCT 53.5* 51.0* 47.8*  ?MCV 102.3*  --  102.4*  ?PLT 270  --  247  ? ?Basic Metabolic Panel: ?Recent Labs  ?Lab 08/29/21 ?1644 08/29/21 ?1730 08/30/21 ?0250  ?NA 138 131* 136  ?K 3.7 7.3* 4.4  ?CL 96*  --  98  ?CO2 32  --  32  ?GLUCOSE 104*  --  138*  ?BUN 6  --  <5*  ?CREATININE 1.00  --  0.91  ?CALCIUM 8.7*  --  8.4*  ? ?GFR: ?Estimated Creatinine Clearance: 86.1 mL/min (by C-G formula based  on SCr of 0.91 mg/dL). ? ? ?HbA1C: ?Hgb A1c MFr Bld  ?Date/Time Value Ref Range Status  ?07/29/2014 11:12 AM 5.2 <5.7 % Final  ?  Comment:  ?                                                                         ?According to the ADA Clinical Practice Recommendations for 2011, when ?HbA1c is used as a screening test: ?  ?  >=6.5%   Diagnostic of Diabetes Mellitus ?           (if abnormal result is confirmed) ?   ?5.7-6.4%   Increased risk of developing Diabetes Mellitus ?  ?References:Diagnosis and Classification of Diabetes Mellitus,Diabetes ?HUDJ,4970,26(VZCHY 1):S62-S69 and Standards of Medical Care in         ?Diabetes - 2011,Diabetes IFOY,7741,28 (Suppl 1):S11-S61. ?  ?  ? ? ?CBG: ?Recent Labs  ?Lab 08/29/21 ?2243  ?GLUCAP 215*  ? ? ?Scheduled Meds: ? enoxaparin (LOVENOX) injection  40 mg Subcutaneous Q24H  ? losartan  100 mg Oral Daily  ? And  ? hydrochlorothiazide  25 mg Oral Daily  ? ipratropium-albuterol  3 mL Nebulization BID  ? lidocaine  1 patch Transdermal Q24H  ? methylPREDNISolone (SOLU-MEDROL) injection  40 mg Intravenous Q12H  ? montelukast  10 mg Oral Daily  ? OLANZapine  10 mg Oral QHS  ? sodium chloride flush  3 mL Intravenous Q12H  ? venlafaxine XR  150 mg Oral Q breakfast  ? ? ? LOS: 0 days  ? ?Lonia Blood, MD ?[facility] ?Office  [phone] ?Pager - Text Page per Loretha Stapler ? ?If 7PM-7AM, please contact night-coverage per Amion ?08/30/2021, 9:39 AM ? ? ?  ?

## 2021-08-30 NOTE — Plan of Care (Signed)
  Problem: Education: Goal: Knowledge of General Education information will improve Description Including pain rating scale, medication(s)/side effects and non-pharmacologic comfort measures Outcome: Progressing   

## 2021-08-31 MED ORDER — SPIRIVA HANDIHALER 18 MCG IN CAPS: 18.0000 ug | ORAL_CAPSULE | Freq: Every day | RESPIRATORY_TRACT | 2 refills | Status: DC

## 2021-08-31 MED ORDER — NICOTINE POLACRILEX 2 MG MT GUM: 2.0000 mg | CHEWING_GUM | OROMUCOSAL | 1 refills | Status: DC | PRN

## 2021-08-31 MED ORDER — PREDNISONE 10 MG PO TABS: ORAL_TABLET | ORAL | 0 refills | Status: DC

## 2021-08-31 NOTE — Plan of Care (Signed)

## 2021-08-31 NOTE — Progress Notes (Signed)
Patient discharged with AVS (all questions answered).  Left with all personal belongings.  Telemetry (CCMD notified) and IV removed. ?

## 2021-08-31 NOTE — TOC Transition Note (Signed)
Transition of Care (TOC) - CM/SW Discharge Note ?Donn Pierini Charity fundraiser, BSN ?Transitions of Care ?Unit 4E- RN Case Manager ?See Treatment Team for direct phone #  ? ? ?Patient Details  ?Name: [name] ?MRN: [id number] ?Date of Birth: December 21, 1980 ? ?Transition of Care (TOC) CM/SW Contact:  ?Zenda Alpers, Lenn Sink, RN ?Phone Number: ?08/31/2021, 1:53 PM ? ? ?Clinical Narrative:    ?Pt stable for transition home, Transition of Care Department [facility]) has reviewed patient and no TOC needs have been identified at this time. Spoke with bedside RN regarding home 02- and she reports per MD pt does not need home 02 at this time- no DME orders placed for home 02. ? ?Final next level of care: Home/Self Care ?Barriers to Discharge: No Barriers Identified ? ? ?Patient Goals and CMS Choice ?  ?  ?Choice offered to / list presented to : NA ? ?Discharge Placement ?  ?           ?  ? Home ?  ?  ? ?Discharge Plan and Services ?  ?  ?           ?DME Arranged: N/A ?DME Agency: NA ?  ?  ?  ?HH Arranged: NA ?HH Agency: NA ?  ?  ?  ? ?Social Determinants of Health (SDOH) Interventions ?  ? ? ?Readmission Risk Interventions ? ?  08/31/2021  ?  1:52 PM  ?Readmission Risk Prevention Plan  ?Post Dischage Appt Complete  ?Medication Screening Complete  ?Transportation Screening Complete  ? ? ? ? ? ?

## 2021-08-31 NOTE — Discharge Summary (Signed)
?DISCHARGE SUMMARY ? ?[name] ? ?MR#: [id number] ? ?DOB:[birth year]  ?Date of Admission: 08/29/2021 ?Date of Discharge: 08/31/2021 ? ?Attending Physician:Jniyah Dantuono Silvestre Gunner, MD ? ?Patient's OXB:[id number], Toni Amend, NP ? ?Consults: none  ? ?Disposition: D/C home  ? ?Follow-up Appts: ? Follow-up Information   ? ? Roe Rutherford, NP Follow up in 1 week(s).   ?Specialty: Adult Health Nurse Practitioner ?Contact information: ?[address] ?[address] ?[place] [address] ?[phone] ? ? ?  ?  ? ?  ?  ? ?  ? ? ?Tests Needing Follow-up: ?-assess control of COPD/Asthma sx ?-assess abstinence from tobacco ?-consider formal PFTs as outpt to better define disease and tailor medical tx ? ?Discharge Diagnoses: ?Acute hypoxic respiratory failure ?Acute asthma + bronchospastic COPD exacerbation ?HTN ?Depression/anxiety ?Heavy tobacco abuse ?Alcohol abuse  ? ?Initial presentation: ?41 year old with a history of asthma, prior history of alcohol abuse abstaining x32 days post rehab, HTN, depression/anxiety, and tobacco abuse in the amount of 2 packs/day who presented to the ED with severe shortness of breath for approximately 18 hours.  This was associated with frequent coughing leading to muscular pain and chest soreness.  Her rescue inhaler did not improve her symptoms.  At [facility] she was found to have an initial heart rate of 120 with O2 saturations 80% on room air.  She required 6 L nasal cannula to improve her saturation to 94%.  CXR was without evidence of focal infiltrate edema or effusion.  CTa was negative for pulmonary embolism. ? ?Hospital Course: ? ?Acute hypoxic respiratory failure - acute asthma exacerbation - acute bronchospastic COPD exacerbation ?Oxygen saturation 80% on room air at presentation with increased work of breathing and tachypnea with subjective dyspnea -does not require oxygen at baseline -significant wheezing appreciable at time of presentation -continue steroid and  bronchodilator therapy with slow prednisone wean - added LAMA inhaler at d/c as pt likely has a component of COPD given the extend of her tobacco abuse - would benefit from formal PFTs as outpt once returned to baseline  ?  ?HTN ?Continue losartan/HCTZ ?  ?Depression/anxiety ?Continue usual Zyprexa, Effexor, and diazepam ?  ?Heavy tobacco abuse ?Patient smoking 2 packs/day since quitting alcohol -counseled on absolute need to discontinue tobacco abuse ?  ?Alcohol abuse ?Recently completed a rehab stay and has abstained for 32 days -patient has been congratulated ?  ? ?Allergies as of 08/31/2021   ? ?   Reactions  ? Penicillins Nausea And Vomiting, Other (See Comments)  ? UNSPECIFIED REACTION  ?Has patient had a PCN reaction causing immediate rash, facial/tongue/throat swelling, SOB or lightheadedness with hypotension:No ?Has patient had a PCN reaction causing severe rash involving mucus membranes or skin necrosis:No ?Has patient had a PCN reaction that REACTION THAT REQUIRED HOSPITALIZATION:  #  #  #  YES  #  #  #  ?Has patient had a PCN reaction occurring within the last 10 years: #  #  #  YES  #  #  #   ? Norvasc [amlodipine Besylate] Other (See Comments)  ? Hypersensitivity? Tip of tongue numb and flushing.  ? ?  ? ?  ?Medication List  ?  ? ?TAKE these medications   ? ?albuterol 108 (90 Base) MCG/ACT inhaler ?Commonly known as: ProAir HFA ?INHALE 1-2 PUFFS INTO THE LUNGS EVERY 6 (SIX) HOURS AS NEEDED FOR WHEEZING OR SHORTNESS OF BREATH. ?  ?albuterol (2.5 MG/3ML) 0.083% nebulizer solution ?Commonly known as: PROVENTIL ?Take 3 mLs (2.5  mg total) by nebulization every 6 (six) hours as needed for wheezing or shortness of breath. ?  ?atomoxetine 60 MG capsule ?Commonly known as: STRATTERA ?Take 60 mg by mouth daily. ?  ?busPIRone 10 MG tablet ?Commonly known as: BUSPAR ?Take 10 mg by mouth 3 (three) times daily. ?  ?diazepam 10 MG tablet ?Commonly known as: VALIUM ?Take 10 mg by mouth every 8 (eight) hours as needed  for anxiety. ?  ?folic acid 1 MG tablet ?Commonly known as: FOLVITE ?Take 1 mg by mouth daily. ?  ?losartan-hydrochlorothiazide 100-25 MG tablet ?Commonly known as: HYZAAR ?TAKE 1 TABLET BY MOUTH EVERY DAY ?  ?meloxicam 15 MG tablet ?Commonly known as: MOBIC ?Take 15 mg by mouth daily. ?  ?montelukast 10 MG tablet ?Commonly known as: SINGULAIR ?Take 10 mg by mouth at bedtime. ?  ?nicotine polacrilex 2 MG gum ?Commonly known as: NICORETTE ?Take 1 each (2 mg total) by mouth as needed for smoking cessation. ?  ?OLANZapine 10 MG tablet ?Commonly known as: ZYPREXA ?Take 10 mg by mouth at bedtime. ?  ?pantoprazole 40 MG tablet ?Commonly known as: PROTONIX ?TAKE 1 TABLET BY MOUTH EVERY DAY ?  ?predniSONE 10 MG tablet ?Commonly known as: DELTASONE ?Take 4 tablets a day on 4/15 and 4/16, 3 tablets a day on 4/17 and 4/18, 2 tablets a day on 4/19 and 4/20, 1 tablet a day on 4/21 and 4/22, then stop. ?  ?Spiriva HandiHaler 18 MCG inhalation capsule ?Generic drug: tiotropium ?Place 1 capsule (18 mcg total) into inhaler and inhale daily. ?  ?thiamine 100 MG tablet ?Take 100 mg by mouth daily. ?  ?venlafaxine XR 150 MG 24 hr capsule ?Commonly known as: EFFEXOR-XR ?Take 150 mg by mouth at bedtime. ?  ? ?  ? ? ?Day of Discharge ?BP (!) 138/93 (BP Location: Right Arm)   Pulse 100   Temp 98.8 ?F (37.1 ?C) (Oral)   Resp 20   Ht 5\' 4"  (1.626 m)   Wt 80.7 kg   SpO2 90%   BMI 30.54 kg/m?  ? ?Physical Exam: ?General: No acute respiratory distress ?Lungs: Mild persistent expiratory wheezing with expiratory phase no longer prolonged and much improved air movement throughout -I directly observe the patient for approximately 15 minutes with no oxygen therapy and saturations were consistently 88% or better at the time of her discharge ?Cardiovascular: Regular rate and rhythm without murmur gallop or rub normal S1 and S2 ?Abdomen: Nontender, nondistended, soft, bowel sounds positive, no rebound, no ascites, no appreciable  mass ?Extremities: No significant cyanosis, clubbing, or edema bilateral lower extremities ? ?Basic Metabolic Panel: ?Recent Labs  ?Lab 08/29/21 ?1644 08/29/21 ?1730 08/30/21 ?0250  ?NA 138 131* 136  ?K 3.7 7.3* 4.4  ?CL 96*  --  98  ?CO2 32  --  32  ?GLUCOSE 104*  --  138*  ?BUN 6  --  <5*  ?CREATININE 1.00  --  0.91  ?CALCIUM 8.7*  --  8.4*  ? ? ?CBC: ?Recent Labs  ?Lab 08/29/21 ?1644 08/29/21 ?1730 08/30/21 ?0250  ?WBC 7.9  --  7.1  ?HGB 16.8* 17.3* 14.9  ?HCT 53.5* 51.0* 47.8*  ?MCV 102.3*  --  102.4*  ?PLT 270  --  247  ? ? ?Time spent in discharge (includes decision making & examination of pt): ?35 minutes ? ?08/31/2021, 12:20 PM  ? ?09/02/2021, MD ?[facility] ?Office  [phone] ? ? ? ? ? ?

## 2022-01-29 ENCOUNTER — Emergency Department (HOSPITAL_COMMUNITY)
Admission: EM | Admit: 2022-01-29 | Discharge: 2022-01-30 | Disposition: A | Discharge status: Other Acute Inpatient Hospital | Payer: Medicaid Other | Attending: Emergency Medicine | Admitting: Emergency Medicine

## 2022-01-29 ENCOUNTER — Encounter (HOSPITAL_COMMUNITY): Payer: Self-pay | Admitting: Emergency Medicine

## 2022-01-29 DIAGNOSIS — N179 Acute kidney failure, unspecified: Secondary | ICD-10-CM | POA: Diagnosis not present

## 2022-01-29 DIAGNOSIS — F17210 Nicotine dependence, cigarettes, uncomplicated: Secondary | ICD-10-CM | POA: Diagnosis not present

## 2022-01-29 DIAGNOSIS — F333 Major depressive disorder, recurrent, severe with psychotic symptoms: Secondary | ICD-10-CM | POA: Diagnosis not present

## 2022-01-29 DIAGNOSIS — Z20822 Contact with and (suspected) exposure to covid-19: Secondary | ICD-10-CM | POA: Insufficient documentation

## 2022-01-29 DIAGNOSIS — J45909 Unspecified asthma, uncomplicated: Secondary | ICD-10-CM | POA: Diagnosis not present

## 2022-01-29 DIAGNOSIS — Z7951 Long term (current) use of inhaled steroids: Secondary | ICD-10-CM | POA: Diagnosis not present

## 2022-01-29 DIAGNOSIS — R45851 Suicidal ideations: Secondary | ICD-10-CM | POA: Diagnosis not present

## 2022-01-29 DIAGNOSIS — R062 Wheezing: Secondary | ICD-10-CM

## 2022-01-29 DIAGNOSIS — F32A Depression, unspecified: Secondary | ICD-10-CM | POA: Diagnosis present

## 2022-01-29 LAB — RAPID URINE DRUG SCREEN, HOSP PERFORMED
Amphetamines: NOT DETECTED
Barbiturates: NOT DETECTED
Benzodiazepines: POSITIVE — AB
Cocaine: NOT DETECTED
Opiates: NOT DETECTED
Tetrahydrocannabinol: POSITIVE — AB

## 2022-01-29 LAB — COMPREHENSIVE METABOLIC PANEL
ALT: 16 U/L (ref 0–44)
AST: 29 U/L (ref 15–41)
Albumin: 3.6 g/dL (ref 3.5–5.0)
Alkaline Phosphatase: 92 U/L (ref 38–126)
Anion gap: 7 (ref 5–15)
BUN: 10 mg/dL (ref 6–20)
CO2: 29 mmol/L (ref 22–32)
Calcium: 8.9 mg/dL (ref 8.9–10.3)
Chloride: 104 mmol/L (ref 98–111)
Creatinine, Ser: 1.12 mg/dL — ABNORMAL HIGH (ref 0.44–1.00)
GFR, Estimated: >60 mL/min (ref >60)
Glucose, Bld: 94 mg/dL (ref 70–99)
Potassium: 3.9 mmol/L (ref 3.5–5.1)
Sodium: 140 mmol/L (ref 135–145)
Total Bilirubin: 0.5 mg/dL (ref 0.3–1.2)
Total Protein: 6.8 g/dL (ref 6.5–8.1)

## 2022-01-29 LAB — CBC WITH DIFFERENTIAL/PLATELET
Abs Immature Granulocytes: 0.07 10*3/uL (ref 0.00–0.07)
Basophils Absolute: 0.1 10*3/uL (ref 0.0–0.1)
Basophils Relative: 2 %
Eosinophils Absolute: 0.1 10*3/uL (ref 0.0–0.5)
Eosinophils Relative: 2 %
HCT: 40.9 % (ref 36.0–46.0)
Hemoglobin: 13.1 g/dL (ref 12.0–15.0)
Immature Granulocytes: 1 %
Lymphocytes Relative: 24 %
Lymphs Abs: 1.7 10*3/uL (ref 0.7–4.0)
MCH: 32.9 pg (ref 26.0–34.0)
MCHC: 32 g/dL (ref 30.0–36.0)
MCV: 102.8 fL — ABNORMAL HIGH (ref 80.0–100.0)
Monocytes Absolute: 0.7 10*3/uL (ref 0.1–1.0)
Monocytes Relative: 9 %
Neutro Abs: 4.5 10*3/uL (ref 1.7–7.7)
Neutrophils Relative %: 62 %
Platelets: 249 10*3/uL (ref 150–400)
RBC: 3.98 MIL/uL (ref 3.87–5.11)
RDW: 14.6 % (ref 11.5–15.5)
WBC: 7.2 10*3/uL (ref 4.0–10.5)
nRBC: 0 % (ref 0.0–0.2)

## 2022-01-29 LAB — I-STAT BETA HCG BLOOD, ED (MC, WL, AP ONLY): I-stat hCG, quantitative: <5 m[IU]/mL (ref <5)

## 2022-01-29 LAB — ETHANOL: Alcohol, Ethyl (B): <10 mg/dL (ref <10)

## 2022-01-29 LAB — RESP PANEL BY RT-PCR (FLU A&B, COVID) ARPGX2
Influenza A by PCR: NEGATIVE
Influenza B by PCR: NEGATIVE
SARS Coronavirus 2 by RT PCR: NEGATIVE

## 2022-01-29 MED ORDER — LACTATED RINGERS IV BOLUS
1000.0000 mL | Freq: Once | INTRAVENOUS | Status: AC
  Administered 2022-01-29: 1000 mL via INTRAVENOUS

## 2022-01-29 MED ORDER — LOSARTAN POTASSIUM 25 MG PO TABS
50.0000 mg | ORAL_TABLET | Freq: Once | ORAL | Status: AC
  Administered 2022-01-29: 50 mg via ORAL
  Filled 2022-01-29: qty 2

## 2022-01-29 MED ORDER — IPRATROPIUM-ALBUTEROL 0.5-2.5 (3) MG/3ML IN SOLN
3.0000 mL | Freq: Once | RESPIRATORY_TRACT | Status: AC
  Administered 2022-01-29: 3 mL via RESPIRATORY_TRACT
  Filled 2022-01-29: qty 3

## 2022-01-29 NOTE — ED Notes (Signed)
Pt expressed she is experiencing short/long term memory loss these past few weeks.
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
## 2022-01-29 NOTE — BH Assessment (Signed)
Clinician messaged Dylan P. Bratu, EMT-P: "Hey. It's Trey with TTS. Is the pt able to engage in the assessment, if so the pt will need to be placed in a private room. Is the pt under IVC? Also is the pt medically cleared?"   Clinician awaiting response.    Kimberla Driskill D Wynter Isaacs, MS, LCMHC, [facility] Triage Specialist [phone]  
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
## 2022-01-29 NOTE — Progress Notes (Addendum)
Per [name], NP pt to be observed and reassessed by psychiatry. CSW will assist and follow with placement.    Maryjean Ka, MSW, LCSWA 01/29/2022 10:08 PM
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
## 2022-01-30 ENCOUNTER — Ambulatory Visit (HOSPITAL_COMMUNITY)
Admission: EM | Admit: 2022-01-30 | Discharge: 2022-01-30 | Disposition: A | Discharge status: Home / Self Care | Payer: Medicaid Other | Attending: Nurse Practitioner | Admitting: Nurse Practitioner

## 2022-01-30 ENCOUNTER — Encounter (HOSPITAL_COMMUNITY): Payer: Self-pay | Admitting: Registered Nurse

## 2022-01-30 DIAGNOSIS — F332 Major depressive disorder, recurrent severe without psychotic features: Secondary | ICD-10-CM | POA: Diagnosis present

## 2022-01-30 DIAGNOSIS — R441 Visual hallucinations: Secondary | ICD-10-CM | POA: Insufficient documentation

## 2022-01-30 DIAGNOSIS — F419 Anxiety disorder, unspecified: Secondary | ICD-10-CM | POA: Insufficient documentation

## 2022-01-30 DIAGNOSIS — F1020 Alcohol dependence, uncomplicated: Secondary | ICD-10-CM | POA: Diagnosis present

## 2022-01-30 DIAGNOSIS — R45851 Suicidal ideations: Secondary | ICD-10-CM | POA: Insufficient documentation

## 2022-01-30 DIAGNOSIS — Z9151 Personal history of suicidal behavior: Secondary | ICD-10-CM | POA: Insufficient documentation

## 2022-01-30 LAB — LIPID PANEL
Cholesterol: 232 mg/dL — ABNORMAL HIGH (ref 0–200)
HDL: 114 mg/dL (ref >40)
LDL Cholesterol: 87 mg/dL (ref 0–99)
Total CHOL/HDL Ratio: 2 RATIO
Triglycerides: 157 mg/dL — ABNORMAL HIGH (ref <150)
VLDL: 31 mg/dL (ref 0–40)

## 2022-01-30 LAB — HEMOGLOBIN A1C
Hgb A1c MFr Bld: 4.6 % — ABNORMAL LOW (ref 4.8–5.6)
Mean Plasma Glucose: 85.32 mg/dL

## 2022-01-30 LAB — TSH: TSH: 4.165 u[IU]/mL (ref 0.350–4.500)

## 2022-01-30 LAB — POCT PREGNANCY, URINE: Preg Test, Ur: NEGATIVE

## 2022-01-30 MED ORDER — LOSARTAN POTASSIUM 50 MG PO TABS: 50.0000 mg | ORAL_TABLET | Freq: Every day | ORAL | Status: DC

## 2022-01-30 MED ORDER — ATOMOXETINE HCL 60 MG PO CAPS
60.0000 mg | ORAL_CAPSULE | Freq: Every day | ORAL | Status: DC
  Administered 2022-01-30: 60 mg via ORAL
  Filled 2022-01-30: qty 1

## 2022-01-30 MED ORDER — OLANZAPINE 10 MG PO TABS
10.0000 mg | ORAL_TABLET | Freq: Every day | ORAL | Status: DC
  Administered 2022-01-30: 10 mg via ORAL
  Filled 2022-01-30: qty 1

## 2022-01-30 MED ORDER — MOMETASONE FURO-FORMOTEROL FUM 200-5 MCG/ACT IN AERO: 2.0000 | INHALATION_SPRAY | Freq: Two times a day (BID) | RESPIRATORY_TRACT | Status: DC | PRN

## 2022-01-30 MED ORDER — ALBUTEROL SULFATE HFA 108 (90 BASE) MCG/ACT IN AERS
2.0000 | INHALATION_SPRAY | Freq: Four times a day (QID) | RESPIRATORY_TRACT | Status: DC | PRN
  Administered 2022-01-30: 2 via RESPIRATORY_TRACT
  Filled 2022-01-30: qty 6.7

## 2022-01-30 MED ORDER — MAGNESIUM HYDROXIDE 400 MG/5ML PO SUSP: 30.0000 mL | Freq: Every day | ORAL | Status: DC | PRN

## 2022-01-30 MED ORDER — PANTOPRAZOLE SODIUM 40 MG PO TBEC
40.0000 mg | DELAYED_RELEASE_TABLET | Freq: Every day | ORAL | Status: DC
  Administered 2022-01-30: 40 mg via ORAL
  Filled 2022-01-30: qty 1

## 2022-01-30 MED ORDER — MOMETASONE FURO-FORMOTEROL FUM 200-5 MCG/ACT IN AERO: 2.0000 | INHALATION_SPRAY | Freq: Two times a day (BID) | RESPIRATORY_TRACT | Status: DC

## 2022-01-30 MED ORDER — BUSPIRONE HCL 5 MG PO TABS
10.0000 mg | ORAL_TABLET | Freq: Three times a day (TID) | ORAL | Status: DC
  Administered 2022-01-30: 10 mg via ORAL
  Filled 2022-01-30: qty 2

## 2022-01-30 MED ORDER — LOSARTAN POTASSIUM 50 MG PO TABS
50.0000 mg | ORAL_TABLET | Freq: Every day | ORAL | Status: DC
  Administered 2022-01-30: 50 mg via ORAL
  Filled 2022-01-30: qty 1

## 2022-01-30 MED ORDER — HYDROXYZINE HCL 25 MG PO TABS
25.0000 mg | ORAL_TABLET | Freq: Three times a day (TID) | ORAL | Status: DC | PRN
  Administered 2022-01-30: 25 mg via ORAL
  Filled 2022-01-30: qty 1

## 2022-01-30 MED ORDER — ALUM & MAG HYDROXIDE-SIMETH 200-200-20 MG/5ML PO SUSP: 30.0000 mL | ORAL | Status: DC | PRN

## 2022-01-30 MED ORDER — OLANZAPINE 10 MG PO TABS: 10.0000 mg | ORAL_TABLET | Freq: Every day | ORAL | Status: DC

## 2022-01-30 MED ORDER — BUPROPION HCL ER (SR) 150 MG PO TB12: 150.0000 mg | ORAL_TABLET | Freq: Two times a day (BID) | ORAL | Status: DC

## 2022-01-30 MED ORDER — ACETAMINOPHEN 325 MG PO TABS: 650.0000 mg | ORAL_TABLET | Freq: Four times a day (QID) | ORAL | Status: DC | PRN

## 2022-01-30 MED ORDER — NICOTINE 21 MG/24HR TD PT24: 21.0000 mg | MEDICATED_PATCH | Freq: Every day | TRANSDERMAL | Status: DC | PRN

## 2022-01-30 NOTE — ED Notes (Signed)
Pt resting quietly with eyes closed.  No pain or discomfort noted/voiced.  Breathing is even and unlabored.  Will continue to monitor for safety.  

## 2022-01-30 NOTE — ED Notes (Signed)
Pt states that her family took her belongings home upon arrival to ED. Pt currently only has shoes with her at this time.

## 2022-01-30 NOTE — ED Provider Notes (Signed)
[facility] Continuous Assessment Admission H&P  Date: 01/30/22 Patient Name: [name] MRN: [id number] Chief Complaint: "I'm feeling really depressed".     Diagnoses:  Final diagnoses:  Severe episode of recurrent major depressive disorder, without psychotic features (HCC)    HPI: [name] is a 41 y.o. female with with psychiatric history of depression, anxiety, alcohol, and tobacco use who presented voluntarily to [facility] as a transfer from [facility], where she was evaluated on 01/29/22 for complaints of feeling increasingly depressed and suicidal over the last few days and visual hallucinations of people that are not there.   Patient reports "I am just feeling down, worthless, and all my hopes are gone".  "I'm really depressed, and I didn't plan to do anything but just thoughts about it".  Pt denies having any plan.  Patient reports she is having a hard time and missing her mom who passed 5 years ago  but "it seems like yesterday".  Patient reports she began feeling suicidal since she was 41 years old.  During this evaluation, patient endorses suicidal thoughts with no plan, denies homicidal ideation, denies audio or visual hallucinations and paranoia.  Patient endorses occasional use of THC, and reports that she drinks alcohol (4 beers daily), and began drinking since age 70 and her last drink was last night.  Chart review shows blood alcohol levels measured on 01/29/2022 at [facility] is normal.  Patient denies illicit drug use.  UDS collected on 01/29/2022 at [facility] shows patient is positive for benzos and THC.  Pt has a history of previous suicide attempts by ingestion, but denied this fact when this provider enquired about it.  Patient reports a family history of seizures but denies a history of seizures.  Patient reports sees a therapist once a month at the [facility].  Patient reports her primary care provider prescribes all her medications.  Patient endorses taking  antidepressants and denies any side effects.  Patient endorses depressive symptoms, including low mood, sleep alteration, loss of interest in pleasurable activities, feelings of guilt/worthlessness/hopelessness, problems with energy, problems with concentration, appetite disturbance.   Patient reports she lives with her boyfriend and daughter.  Patient denies access to guns or weapons at home.  Support encouragement and reassurance provided about ongoing stressors.  On evaluation, patient is alert, oriented x 3, and cooperative. Speech is clear and coherent. Pt appears appropriate. Eye contact is good. Mood is depressed, affect is congruent with mood. Thought process and thought content is coherent. Pt endorses passive SI, denies HI/AVH. There is no indication that the patient is responding to internal stimuli. No delusions elicited during this assessment.    PHQ 2-9:  [employer] Visit from 03/13/2020 in [facility] Office Visit from 08/16/2019 in [facility] Office Visit from 02/05/2018 in [facility]  Thoughts that you would be better off dead, or of hurting yourself in some way Not at all Not at all Not at all  PHQ-9 Total Score Flowsheet Row ED from 01/29/2022 in [facility] ED to Hosp-Admission (Discharged) from 08/29/2021 in [facility] 4E CV SURGICAL PROGRESSIVE CARE  C-SSRS RISK CATEGORY High Risk No Risk        Total Time spent with patient: 20 minutes  Musculoskeletal  Strength & Muscle Tone: within normal limits Gait & Station: normal Patient leans: N/A  Psychiatric Specialty Exam  Presentation General Appearance: Appropriate for Environment  Eye Contact:Good  Speech:Clear and Coherent  Speech Volume:Normal  Handedness:Right   Mood and Affect  Mood:Depressed  Affect:Congruent   Thought Process  Thought  Processes:Coherent  Descriptions of Associations:Intact  Orientation:Full (Time, Place and Person)  Thought Content:WDL    Hallucinations:Hallucinations: None  Ideas of Reference:None  Suicidal Thoughts:Suicidal Thoughts: Yes, Passive SI Passive Intent and/or Plan: Without Plan  Homicidal Thoughts:Homicidal Thoughts: No   Sensorium  Memory:Immediate Good; Recent Good  Judgment:Intact  Insight:Fair   Executive Functions  Concentration:Good  Attention Span:Good  Recall:Good  Fund of Knowledge:Good  Language:Good   Psychomotor Activity  Psychomotor Activity:Psychomotor Activity: Normal   Assets  Assets:Communication Skills; Desire for Improvement; Housing; Intimacy; Social Support   Sleep  Sleep:Sleep: Fair   Nutritional Assessment (For OBS and FBC admissions only) Has the patient had a weight loss or gain of 10 pounds or more in the last 3 months?: No Has the patient had a decrease in food intake/or appetite?: Yes Does the patient have dental problems?: No Does the patient have eating habits or behaviors that may be indicators of an eating disorder including binging or inducing vomiting?: No Has the patient recently lost weight without trying?: 2.0 Has the patient been eating poorly because of a decreased appetite?: 1 Malnutrition Screening Tool Score: 3 Nutritional Assessment Referrals: Medication/Tx changes    Physical Exam Constitutional:      General: She is not in acute distress.    Appearance: She is not diaphoretic.  HENT:     Head: Normocephalic.     Right Ear: External ear normal.     Left Ear: External ear normal.     Nose: No congestion.  Eyes:     General:        Right eye: No discharge.        Left eye: No discharge.  Cardiovascular:     Rate and Rhythm: Normal rate.  Pulmonary:     Effort: No respiratory distress.  Chest:     Chest wall: No tenderness.  Neurological:     Mental Status: She is alert and oriented to person,  place, and time.  Psychiatric:        Attention and Perception: Attention and perception normal.        Mood and Affect: Mood is depressed. Affect is flat.        Speech: Speech normal.        Behavior: Behavior is cooperative.        Thought Content: Thought content is not delusional. Thought content includes suicidal ideation. Thought content does not include homicidal ideation. Thought content does not include homicidal or suicidal plan.        Cognition and Memory: Cognition and memory normal.        Judgment: Judgment normal.    Review of Systems  Constitutional:  Negative for chills, fever and weight loss.  HENT:  Negative for congestion.   Eyes:  Negative for discharge.  Respiratory:  Negative for cough, shortness of breath and wheezing.   Cardiovascular:  Negative for chest pain and palpitations.  Gastrointestinal:  Negative for diarrhea, nausea and vomiting.  Neurological:  Negative for dizziness, seizures, loss of consciousness, weakness and headaches.  Psychiatric/Behavioral:  Positive for depression, substance abuse and suicidal ideas. Negative for hallucinations. The patient is not nervous/anxious and does not have insomnia.     Blood pressure (!) 136/98, pulse 82, temperature 98 F (36.7 C), temperature source Oral, resp. rate 20,  SpO2 100 %. There is no height or weight on file to calculate BMI.  Past Psychiatric History: See H & P   Is the patient at risk to self? Yes  Has the patient been a risk to self in the past 6 months? No .    Has the patient been a risk to self within the distant past? No   Is the patient a risk to others? No   Has the patient been a risk to others in the past 6 months? No   Has the patient been a risk to others within the distant past? No   Past Medical History:  Past Medical History:  Diagnosis Date   Anxiety    Asthma    Depression    doing good, not on meds now   GERD (gastroesophageal reflux disease)    Headache    migraines    History of kidney stones    Hypertension    Kidney stones    Missed abortion 10/02/2013   Ovarian cyst    Pregnancy induced hypertension    Suicidal intent    Vaginal Pap smear, abnormal    bx, f/u ok    Past Surgical History:  Procedure Laterality Date   CESAREAN SECTION     CESAREAN SECTION N/A 06/06/2015   Procedure: CESAREAN SECTION;  Surgeon: Kathreen Cosier, MD;  Location: [facility] ORS;  Service: Obstetrics;  Laterality: N/A;   CHOLECYSTECTOMY N/A 08/23/2016   Procedure: LAPAROSCOPIC CHOLECYSTECTOMY;  Surgeon: Berna Bue, MD;  Location: [facility] OR;  Service: General;  Laterality: N/A;   DILATION AND CURETTAGE OF UTERUS     DILATION AND EVACUATION N/A 10/04/2013   Procedure: DILATATION AND EVACUATION;  Surgeon: Sherian Rein, MD;  Location: [facility] ORS;  Service: Gynecology;  Laterality: N/A;  [place] in room    TONSILLECTOMY     TUBAL LIGATION      Family History:  Family History  Problem Relation Age of Onset   Hearing loss Mother    Asthma Mother    Asthma Father    Diabetes Son    Cancer Maternal Grandfather        bone   Liver disease Maternal Grandfather    Asthma Sister    Lupus Sister     Social History:  Social History   Socioeconomic History   Marital status: Single    Spouse name: Not on file   Number of children: Not on file   Years of education: Not on file   Highest education level: Not on file  Occupational History   Not on file  Tobacco Use   Smoking status: Every Day    Packs/day: 0.25    Years: 15.00    Total pack years: 3.75    Types: Cigarettes   Smokeless tobacco: Never  Vaping Use   Vaping Use: Never used  Substance and Sexual Activity   Alcohol use: Yes    Alcohol/week: 4.0 standard drinks of alcohol    Types: 4 Cans of beer per week   Drug use: No   Sexual activity: Yes    Partners: Male    Birth control/protection: Surgical  Other Topics Concern   Not on file  Social History Narrative   Not on file   Social Determinants of  Health   Financial Resource Strain: Not on file  Food Insecurity: Not on file  Transportation Needs: Not on file  Physical Activity: Not on file  Stress: Not on file  Social Connections: Not  on file  Intimate Partner Violence: Not on file    SDOH:  SDOH Screenings   Alcohol Screen: Low Risk  (07/20/2017)  Depression (PHQ2-9): Medium Risk (03/13/2020)  Tobacco Use: High Risk (01/29/2022)    Last Labs:  Admission on 01/29/2022, Discharged on 01/30/2022  Component Date Value Ref Range Status   SARS Coronavirus 2 by RT PCR 01/29/2022 NEGATIVE  NEGATIVE Final   Comment: (NOTE) SARS-CoV-2 target nucleic acids are NOT DETECTED.  The SARS-CoV-2 RNA is generally detectable in upper respiratory specimens during the acute phase of infection. The lowest concentration of SARS-CoV-2 viral copies this assay can detect is 138 copies/mL. A negative result does not preclude SARS-Cov-2 infection and should not be used as the sole basis for treatment or other patient management decisions. A negative result may occur with  improper specimen collection/handling, submission of specimen other than nasopharyngeal swab, presence of viral mutation(s) within the areas targeted by this assay, and inadequate number of viral copies(<138 copies/mL). A negative result must be combined with clinical observations, patient history, and epidemiological information. The expected result is Negative.  Fact Sheet for Patients:  BloggerCourse.com  Fact Sheet for Healthcare Providers:  SeriousBroker.it  This test is no                          t yet approved or cleared by the [place] FDA and  has been authorized for detection and/or diagnosis of SARS-CoV-2 by FDA under an Emergency Use Authorization (EUA). This EUA will remain  in effect (meaning this test can be used) for the duration of the COVID-19 declaration under Section 564(b)(1) of the Act,  21 U.S.C.section 360bbb-3(b)(1), unless the authorization is terminated  or revoked sooner.       Influenza A by PCR 01/29/2022 NEGATIVE  NEGATIVE Final   Influenza B by PCR 01/29/2022 NEGATIVE  NEGATIVE Final   Comment: (NOTE) The Xpert Xpress SARS-CoV-2/FLU/RSV plus assay is intended as an aid in the diagnosis of influenza from Nasopharyngeal swab specimens and should not be used as a sole basis for treatment. Nasal washings and aspirates are unacceptable for Xpert Xpress SARS-CoV-2/FLU/RSV testing.  Fact Sheet for Patients: BloggerCourse.com  Fact Sheet for Healthcare Providers: SeriousBroker.it  This test is not yet approved or cleared by the [place] FDA and has been authorized for detection and/or diagnosis of SARS-CoV-2 by FDA under an Emergency Use Authorization (EUA). This EUA will remain in effect (meaning this test can be used) for the duration of the COVID-19 declaration under Section 564(b)(1) of the Act, 21 U.S.C. section 360bbb-3(b)(1), unless the authorization is terminated or revoked.  Performed at [facility], [address], [place], [place] [address]    Sodium 01/29/2022 140  135 - 145 mmol/L Final   Potassium 01/29/2022 3.9  3.5 - 5.1 mmol/L Final   Chloride 01/29/2022 104  98 - 111 mmol/L Final   CO2 01/29/2022 29  22 - 32 mmol/L Final   Glucose, Bld 01/29/2022 94  70 - 99 mg/dL Final   Glucose reference range applies only to samples taken after fasting for at least 8 hours.   BUN 01/29/2022 10  6 - 20 mg/dL Final   Creatinine, Ser 01/29/2022 1.12 (H)  0.44 - 1.00 mg/dL Final   Calcium 23/55/7322 8.9  8.9 - 10.3 mg/dL Final   Total Protein 02/54/2706 6.8  6.5 - 8.1 g/dL Final   Albumin 23/76/2831 3.6  3.5 - 5.0 g/dL Final  AST 01/29/2022 29  15 - 41 U/L Final   ALT 01/29/2022 16  0 - 44 U/L Final   Alkaline Phosphatase 01/29/2022 92  38 - 126 U/L Final   Total Bilirubin  01/29/2022 0.5  0.3 - 1.2 mg/dL Final   GFR, Estimated 01/29/2022 >60  >60 mL/min Final   Comment: (NOTE) Calculated using the CKD-EPI Creatinine Equation (2021)    Anion gap 01/29/2022 7  5 - 15 Final   Performed at [facility], [address] [place], [place] [address]   Alcohol, Ethyl (B) 01/29/2022 <10  <10 mg/dL Final   Comment: (NOTE) Lowest detectable limit for serum alcohol is 10 mg/dL.  For medical purposes only. Performed at [facility], [address] [place], [place] [address]    Opiates 01/29/2022 NONE DETECTED  NONE DETECTED Final   Cocaine 01/29/2022 NONE DETECTED  NONE DETECTED Final   Benzodiazepines 01/29/2022 POSITIVE (A)  NONE DETECTED Final   Amphetamines 01/29/2022 NONE DETECTED  NONE DETECTED Final   Tetrahydrocannabinol 01/29/2022 POSITIVE (A)  NONE DETECTED Final   Barbiturates 01/29/2022 NONE DETECTED  NONE DETECTED Final   Comment: (NOTE) DRUG SCREEN FOR MEDICAL PURPOSES ONLY.  IF CONFIRMATION IS NEEDED FOR ANY PURPOSE, NOTIFY LAB WITHIN 5 DAYS.  LOWEST DETECTABLE LIMITS FOR URINE DRUG SCREEN Drug Class                     Cutoff (ng/mL) Amphetamine and metabolites    1000 Barbiturate and metabolites    200 Benzodiazepine                 200 Tricyclics and metabolites     300 Opiates and metabolites        300 Cocaine and metabolites        300 THC                            50 Performed at [facility], [address] [place], [place] [address]    WBC 01/29/2022 7.2  4.0 - 10.5 K/uL Final   RBC 01/29/2022 3.98  3.87 - 5.11 MIL/uL Final   Hemoglobin 01/29/2022 13.1  12.0 - 15.0 g/dL Final   HCT 37/02/6268 40.9  36.0 - 46.0 % Final   MCV 01/29/2022 102.8 (H)  80.0 - 100.0 fL Final   MCH 01/29/2022 32.9  26.0 - 34.0 pg Final   MCHC 01/29/2022 32.0  30.0 - 36.0 g/dL Final   RDW 48/54/6270 14.6  11.5 - 15.5 % Final   Platelets 01/29/2022 249  150 - 400 K/uL Final   nRBC 01/29/2022 0.0   0.0 - 0.2 % Final   Neutrophils Relative % 01/29/2022 62  % Final   Neutro Abs 01/29/2022 4.5  1.7 - 7.7 K/uL Final   Lymphocytes Relative 01/29/2022 24  % Final   Lymphs Abs 01/29/2022 1.7  0.7 - 4.0 K/uL Final   Monocytes Relative 01/29/2022 9  % Final   Monocytes Absolute 01/29/2022 0.7  0.1 - 1.0 K/uL Final   Eosinophils Relative 01/29/2022 2  % Final   Eosinophils Absolute 01/29/2022 0.1  0.0 - 0.5 K/uL Final   Basophils Relative 01/29/2022 2  % Final   Basophils Absolute 01/29/2022 0.1  0.0 - 0.1 K/uL Final   Immature Granulocytes 01/29/2022 1  % Final   Abs Immature Granulocytes 01/29/2022 0.07  0.00 - 0.07 K/uL Final   Performed at  [facility], [address], [place], [place] [address]   I-stat hCG, quantitative 01/29/2022 <5.0  <5 mIU/mL Final   Comment 3 01/29/2022          Final   Comment:   GEST. AGE      CONC.  (mIU/mL)   <=1 WEEK        5 - 50     2 WEEKS       50 - 500     3 WEEKS       100 - 10,000     4 WEEKS     1,000 - 30,000        FEMALE AND NON-PREGNANT FEMALE:     LESS THAN 5 mIU/mL   Admission on 08/29/2021, Discharged on 08/31/2021  Component Date Value Ref Range Status   Sodium 08/29/2021 138  135 - 145 mmol/L Final   Potassium 08/29/2021 3.7  3.5 - 5.1 mmol/L Final   Chloride 08/29/2021 96 (L)  98 - 111 mmol/L Final   CO2 08/29/2021 32  22 - 32 mmol/L Final   Glucose, Bld 08/29/2021 104 (H)  70 - 99 mg/dL Final   Glucose reference range applies only to samples taken after fasting for at least 8 hours.   BUN 08/29/2021 6  6 - 20 mg/dL Final   Creatinine, Ser 08/29/2021 1.00  0.44 - 1.00 mg/dL Final   Calcium 90/04/2240 8.7 (L)  8.9 - 10.3 mg/dL Final   GFR, Estimated 08/29/2021 >60  >60 mL/min Final   Comment: (NOTE) Calculated using the CKD-EPI Creatinine Equation (2021)    Anion gap 08/29/2021 10  5 - 15 Final   Performed at [facility], [address] [place], [place] [address]   WBC 08/29/2021 7.9  4.0 - 10.5  K/uL Final   RBC 08/29/2021 5.23 (H)  3.87 - 5.11 MIL/uL Final   Hemoglobin 08/29/2021 16.8 (H)  12.0 - 15.0 g/dL Final   HCT 14/27/6701 53.5 (H)  36.0 - 46.0 % Final   MCV 08/29/2021 102.3 (H)  80.0 - 100.0 fL Final   MCH 08/29/2021 32.1  26.0 - 34.0 pg Final   MCHC 08/29/2021 31.4  30.0 - 36.0 g/dL Final   RDW 02/20/4960 16.5 (H)  11.5 - 15.5 % Final   Platelets 08/29/2021 270  150 - 400 K/uL Final   nRBC 08/29/2021 0.0  0.0 - 0.2 % Final   Performed at [facility], [address] [place], [place] [address]   Troponin I (High Sensitivity) 08/29/2021 7  <18 ng/L Final   Comment: (NOTE) Elevated high sensitivity troponin I (hsTnI) values and significant  changes across serial measurements may suggest ACS but many other  chronic and acute conditions are known to elevate hsTnI results.  Refer to the "Links" section for chest pain algorithms and additional  guidance. Performed at [facility], [address] [place], [place] [address]    SARS Coronavirus 2 by RT PCR 08/29/2021 NEGATIVE  NEGATIVE Final   Comment: (NOTE) SARS-CoV-2 target nucleic acids are NOT DETECTED.  The SARS-CoV-2 RNA is generally detectable in upper respiratory specimens during the acute phase of infection. The lowest concentration of SARS-CoV-2 viral copies this assay can detect is 138 copies/mL. A negative result does not preclude SARS-Cov-2 infection and should not be used as the sole basis for treatment or other patient management decisions. A negative result may occur with  improper specimen collection/handling, submission  of specimen other than nasopharyngeal swab, presence of viral mutation(s) within the areas targeted by this assay, and inadequate number of viral copies(<138 copies/mL). A negative result must be combined with clinical observations, patient history, and epidemiological information. The expected result is Negative.  Fact Sheet for Patients:   BloggerCourse.com  Fact Sheet for Healthcare Providers:  SeriousBroker.it  This test is no                          t yet approved or cleared by the [place] FDA and  has been authorized for detection and/or diagnosis of SARS-CoV-2 by FDA under an Emergency Use Authorization (EUA). This EUA will remain  in effect (meaning this test can be used) for the duration of the COVID-19 declaration under Section 564(b)(1) of the Act, 21 U.S.C.section 360bbb-3(b)(1), unless the authorization is terminated  or revoked sooner.       Influenza A by PCR 08/29/2021 NEGATIVE  NEGATIVE Final   Influenza B by PCR 08/29/2021 NEGATIVE  NEGATIVE Final   Comment: (NOTE) The Xpert Xpress SARS-CoV-2/FLU/RSV plus assay is intended as an aid in the diagnosis of influenza from Nasopharyngeal swab specimens and should not be used as a sole basis for treatment. Nasal washings and aspirates are unacceptable for Xpert Xpress SARS-CoV-2/FLU/RSV testing.  Fact Sheet for Patients: BloggerCourse.com  Fact Sheet for Healthcare Providers: SeriousBroker.it  This test is not yet approved or cleared by the [place] FDA and has been authorized for detection and/or diagnosis of SARS-CoV-2 by FDA under an Emergency Use Authorization (EUA). This EUA will remain in effect (meaning this test can be used) for the duration of the COVID-19 declaration under Section 564(b)(1) of the Act, 21 U.S.C. section 360bbb-3(b)(1), unless the authorization is terminated or revoked.  Performed at [facility], [address] [place], [place] [address]    pH, Ven 08/29/2021 7.319  7.25 - 7.43 Final   pCO2, Ven 08/29/2021 79.2 (HH)  44 - 60 mmHg Final   pO2, Ven 08/29/2021 39  32 - 45 mmHg Final   Bicarbonate 08/29/2021 40.7 (H)  20.0 - 28.0 mmol/L Final   TCO2 08/29/2021 43 (H)  22 - 32 mmol/L Final   O2  Saturation 08/29/2021 65  % Final   Acid-Base Excess 08/29/2021 10.0 (H)  0.0 - 2.0 mmol/L Final   Sodium 08/29/2021 131 (L)  135 - 145 mmol/L Final   Potassium 08/29/2021 7.3 (HH)  3.5 - 5.1 mmol/L Final   Calcium, Ion 08/29/2021 1.09 (L)  1.15 - 1.40 mmol/L Final   HCT 08/29/2021 51.0 (H)  36.0 - 46.0 % Final   Hemoglobin 08/29/2021 17.3 (H)  12.0 - 15.0 g/dL Final   Collection site 08/29/2021 IV start   Final   Drawn by 08/29/2021 RT   Final   Sample type 08/29/2021 VENOUS   Final   Comment 08/29/2021 NOTIFIED PHYSICIAN   Final   Troponin I (High Sensitivity) 08/29/2021 5  <18 ng/L Final   Comment: (NOTE) Elevated high sensitivity troponin I (hsTnI) values and significant  changes across serial measurements may suggest ACS but many other  chronic and acute conditions are known to elevate hsTnI results.  Refer to the "Links" section for chest pain algorithms and additional  guidance. Performed at [facility], [address] [place], [place] [address]    Glucose-Capillary 08/29/2021 215 (H)  70 - 99 mg/dL Final   Glucose reference range applies  only to samples taken after fasting for at least 8 hours.   Comment 1 08/29/2021 Notify RN   Final   Comment 2 08/29/2021 Document in Chart   Final   HIV Screen 4th Generation wRfx 08/30/2021 Non Reactive  Non Reactive Final   Performed at [facility], [address] [place], [place] [address]   Sodium 08/30/2021 136  135 - 145 mmol/L Final   Potassium 08/30/2021 4.4  3.5 - 5.1 mmol/L Final   Chloride 08/30/2021 98  98 - 111 mmol/L Final   CO2 08/30/2021 32  22 - 32 mmol/L Final   Glucose, Bld 08/30/2021 138 (H)  70 - 99 mg/dL Final   Glucose reference range applies only to samples taken after fasting for at least 8 hours.   BUN 08/30/2021 <5 (L)  6 - 20 mg/dL Final   Creatinine, Ser 08/30/2021 0.91  0.44 - 1.00 mg/dL Final   Calcium 60/45/4098 8.4 (L)  8.9 - 10.3 mg/dL Final   GFR, Estimated 08/30/2021 >60  >60 mL/min  Final   Comment: (NOTE) Calculated using the CKD-EPI Creatinine Equation (2021)    Anion gap 08/30/2021 6  5 - 15 Final   Performed at [facility], [address], [place], [place] [address]   WBC 08/30/2021 7.1  4.0 - 10.5 K/uL Final   RBC 08/30/2021 4.67  3.87 - 5.11 MIL/uL Final   Hemoglobin 08/30/2021 14.9  12.0 - 15.0 g/dL Final   HCT 78/29/5621 47.8 (H)  36.0 - 46.0 % Final   MCV 08/30/2021 102.4 (H)  80.0 - 100.0 fL Final   MCH 08/30/2021 31.9  26.0 - 34.0 pg Final   MCHC 08/30/2021 31.2  30.0 - 36.0 g/dL Final   RDW 30/86/5784 16.4 (H)  11.5 - 15.5 % Final   Platelets 08/30/2021 247  150 - 400 K/uL Final   nRBC 08/30/2021 0.0  0.0 - 0.2 % Final   Performed at [facility], [address], [place], [place] [address]    Allergies: Alprazolam, Penicillins, and Norvasc [amlodipine besylate]  PTA Medications: (Not in a hospital admission)  Prior to Admission medications   Medication Sig Start Date End Date Taking? Authorizing Provider  albuterol (PROAIR HFA) 108 (90 Base) MCG/ACT inhaler INHALE 1-2 PUFFS INTO THE LUNGS EVERY 6 (SIX) HOURS AS NEEDED FOR WHEEZING OR SHORTNESS OF BREATH. Patient taking differently: Inhale 2 puffs into the lungs every 6 (six) hours as needed for wheezing or shortness of breath. 12/14/20   Worthy Rancher B, FNP  albuterol (PROVENTIL) (2.5 MG/3ML) 0.083% nebulizer solution Take 3 mLs (2.5 mg total) by nebulization every 6 (six) hours as needed for wheezing or shortness of breath. 03/27/21   Charlott Holler, MD  atomoxetine (STRATTERA) 60 MG capsule Take 60 mg by mouth daily. 08/11/21   [provider]  buPROPion (WELLBUTRIN SR) 150 MG 12 hr tablet Take 150 mg by mouth in the morning and at bedtime.    [provider]  busPIRone (BUSPAR) 10 MG tablet Take 10 mg by mouth 3 (three) times daily. 08/11/21   [provider]  diazepam (VALIUM) 10 MG tablet Take 5-10 mg by mouth every 8 (eight) hours as needed for anxiety.  08/20/21   [provider]  dicyclomine (BENTYL) 10 MG capsule Take 10 mg by mouth 3 (three) times daily before meals.    [provider]  folic acid (FOLVITE) 1 MG tablet Take 1 mg by mouth daily. 08/22/21  [provider]  ibuprofen (ADVIL) 200 MG tablet Take 800 mg by mouth every 6 (six) hours as needed for mild pain or headache.    [provider]  losartan (COZAAR) 50 MG tablet Take 50 mg by mouth daily.    [provider]  losartan-hydrochlorothiazide (HYZAAR) 100-25 MG tablet TAKE 1 TABLET BY MOUTH EVERY DAY Patient not taking: Reported on 01/29/2022 11/10/20   Hoy Register, MD  meloxicam (MOBIC) 15 MG tablet Take 15 mg by mouth daily. 02/25/21   [provider]  montelukast (SINGULAIR) 10 MG tablet Take 10 mg by mouth at bedtime. 12/15/20   [provider]  nicotine (NICODERM CQ - DOSED IN MG/24 HOURS) 21 mg/24hr patch Place 21 mg onto the skin daily as needed (for smoking cessation).    [provider]  nicotine polacrilex (NICORETTE) 2 MG gum Take 1 each (2 mg total) by mouth as needed for smoking cessation. 08/31/21   Lonia Blood, MD  OLANZapine (ZYPREXA) 10 MG tablet Take 10 mg by mouth at bedtime. 08/13/21   [provider]  pantoprazole (PROTONIX) 40 MG tablet TAKE 1 TABLET BY MOUTH EVERY DAY Patient taking differently: Take 40 mg by mouth daily before breakfast. 12/14/20   Charlott Holler, MD  predniSONE (DELTASONE) 10 MG tablet Take 4 tablets a day on 4/15 and 4/16, 3 tablets a day on 4/17 and 4/18, 2 tablets a day on 4/19 and 4/20, 1 tablet a day on 4/21 and 4/22, then stop. Patient not taking: Reported on 01/29/2022 08/31/21   Lonia Blood, MD  SYMBICORT 160-4.5 MCG/ACT inhaler Inhale 2 puffs into the lungs 2 (two) times daily.    [provider]  tiotropium (SPIRIVA HANDIHALER) 18 MCG inhalation capsule Place 1 capsule (18 mcg total) into inhaler and inhale daily. Patient not taking:  Reported on 01/29/2022 08/31/21 08/31/22  Lonia Blood, MD  venlafaxine XR (EFFEXOR-XR) 37.5 MG 24 hr capsule Take 37.5 mg by mouth daily with breakfast.    [provider]    Medical Decision Making  Recommend admit to continuous observation unit for crisis stabilization, safety monitoring and medication management.  Lab Orders         Hemoglobin A1c         Lipid panel         TSH         Pregnancy, urine         Pregnancy, urine POC    EKG  Home medications reordered this encounter -Ventilating HFA 2 puffs inhalation every 6 hours as needed for wheezing or shortness of breath -Strattera 60 mg p.o. daily ADHD -BuSpar 10 mg p.o. 3 times daily anxiety -Losartan 50 mg p.o. daily HTN -Dulera 2 puffs inhalation twice daily as needed for wheezing shortness of breath -Zyprexa 10 mg p.o. nightly mood -Protonix EC 40 mg p.o. daily GERD  Other PRNs -NICODERM CQ 24-hour 21 mg transdermal patch daily as needed smoking cessation -Tylenol 650 mg p.o. every 6 hours as needed pain -Maalox 30 mL p.o. every 4 hours as needed indigestion -Atarax 25 mg p.o. 3 times daily as needed anxiety -MOM 30 mL p.o. daily as needed constipation    Recommendations  Based on my evaluation the patient does not appear to have an emergency medical condition.  Recommend admission to continues observation unit and re-eval in a.m for SI/HI/AVH.  Mancel Bale, NP 01/30/22  1:06 AM

## 2022-01-30 NOTE — Discharge Instructions (Addendum)
If you need to be seen prior to scheduled appointment you may come during walk in hours for intake assessment at [facility].  See hours below.    [facility]: Outpatient psychiatric Services  New Patient Assessment and Therapy Walk-in Monday thru Thursday 8:00 am first come first serve until slots are full Every Friday from 1:00 pm to 4:00 pm first come first serve until slots are full  New Patient Psychiatric Medication Management Monday thru Friday from 8:00 am to 11:00 am first come first served until slots are full  For all walk-ins we ask that you arrive by 7:15 am because patients will be seen in there order of arrival.   Availability is limited, and therefore you may not be seen on the same day that you walk in.  Our goal is to serve and meet the needs of our community to the best of our ability.     Substance Abuse Treatment Programs  Intensive Outpatient Programs [facility]     [address]      [place], [place]                   [phone]       [facility] [address] [place], [place] [phone]  [facility]     (Inpatient and outpatient)     [address]           [phone]    [facility] [phone] (Suboxone and Methadone)  [address]      [place], [place] [address]      [phone]       [address] [place], [place] [phone]  [facility] (Outpatient/Inpatient, [facility])    (insurance only) [phone]             [facility] (Groups & Residential) [place], [place] [phone]     [facility]     [address]     [place], [place]      [phone]       [facility] (for caregivers and family) [phone] [place], [place] [phone]      Residential Treatment Programs [facility]      [address] [place], [place]  [address]  [phone]       [facility] [address], [place], [place] [address] [phone]  [facility] [place]        [phone]       [facility] [phone]  [facility] ([facility]             [address]                                         [place], [place]                                                [phone] or [phone]                               [facility] [address] [place], [address] [phone]  [facility]    [address]      [place], [place]     [phone]       [facility] [address] [place], [place] [phone]  [facility]   [address] [place], [place] [address]     [phone]      Admissions: 8am-3pm M-F  [facility] ([facility]) [address] [place], [place] [phone]  [facility]: Residential Program [phone] Days)   [place], [place]      [phone] or [phone]     [facility]: [facility] [place], [place] (Walk in Hours over the weekend or by referral)  [facility] [address] [place], [place], [place] [address] [phone]  Crisis Mobile: [facility]:  [phone] (for crisis response 24 hours a day) [facility] Hotline:      [phone] Outpatient Psychiatry and Counseling  [facility]: Mobile Crisis Management 24 hours:  [phone]  [facility] [employer] sliding scale fee and walk in schedule: M-F 8am-12pm/1pm-3pm [address]  [place], [place] [address] [phone]  [facility] [address] [place], [place] [address] [phone]  [facility] ([facility] new patient walk-in appointments available Monday - Friday 8am -3pm.          [address] [place], [place] [address] [phone] or crisis line- [phone]  [facility]  Outpatient Services/ Intensive Outpatient Therapy Program [address] [place], [place] [address] [phone]  [facility]                  Crisis Services      [phone] [address]     [place], [place] [address]                 [facility]   [facility] [phone]. [address] [place], [place] [address]   [facility]          [address] [place]  [place], [place] [address]       [phone]  [facility] [address] [place], [place] [address] [phone]  [facility]    [address]    [place], [place] [address]     [phone]       Andee Poles, MD     [address]     [place] [address]     [phone]       [facility] [address] [place] [address]  [facility]     [address] [place], [place]      [phone]       [facility] Eulogio Ditch, MD [address] [place], [place] [address] [phone]  [facility]     [address]     [place], [place] [address]     [phone]       [facility] [address] [place], [place] [address] [phone] Resources for Temporary Residential Assistance/Crisis Centers  [facility] CENT[facility]) M-F 8am-3pm   [address] [place], [place] [address]   [phone] Services include: laundry, barbering, support groups, case management, phone  & computer access, showers, AA/NA mtgs, mental health/substance abuse nurse, job skills class,  disability information, VA assistance, spiritual classes, etc.   [facility]   [address] [place]     [phone]              [facility] (women and children)       [address] [place], [place]  [address] [phone] [email or web address] for application and process Application Required  [facility]   [address]    [place] [address]     [phone]                    [place], [place] [address] [id number] application appt.) Application Required  [facility] (women only)    [address]     [place], [place] [address]     [phone]      Intake starts 6pm daily Need valid ID, SSC, & Police report [facility] [employer] [address] [place], [place] [id number] Application Required  [employer] (men only)     [place]      [place], [place]       [place] (Pregnant women only) [address] [place], [place]  [facility]      [address]      [place], [place] [address]     [phone]             [facility] [address] [place], [place] 90 day commitment/SA/Application process  [facility](men only)     [address]     [place], [place]       Check-in at [facility] [address] [place],  [address] [phone] Men/Women/Women and Children must be there by 7 pm  [place], [place]

## 2022-01-30 NOTE — ED Notes (Signed)
Safe Transport services called.

## 2022-01-30 NOTE — ED Notes (Signed)
Pt resting quietly in bed.  No complaints of pain, HI, SI and AVH.  She interacted minimally when speaking with this Clinical research associate.  Pt continued to fall back asleep while speaking.  Breathing is even and unlabored.  Will monitor for safety.

## 2022-01-30 NOTE — ED Notes (Addendum)
This pt has been very pleasant during my care, polite, no aggressive behavior noted, cooperative with staff direction.

## 2022-01-30 NOTE — ED Provider Notes (Signed)
FBC/OBS ASAP Discharge Summary  Date and Time: 01/30/2022 12:32 PM  Name: [name]  MRN:  [id number]   Discharge Diagnoses:  Final diagnoses:  MDD (major depressive disorder), recurrent severe, without psychosis ([place])  Alcohol use disorder, moderate, in controlled environment ([place])   Subjective: [name] is a 41 year old female admitted to continuous assessment unit after initially presenting to [facility] ED voluntary and unaccompanied with complaints depression, having thoughts of harming herself with no specific plan, and issues with her boyfriend, and missing her son who lives in [place].  Reported drinking and smoking marijuana daily.  Reported her primary care physician prescribes psychotropic medications [name], AGNP  (Wellbutrin, Strattera, Valium, Buspar).  [name], 41 y.o., female patient seen face to face by this provider, consulted with Dr. Melba Ibarra; and chart reviewed on 01/30/22.  On evaluation [name] reports she is feeling better today.  States she went to the ED because she was depressed, felt helpless, and was having thoughts of self-harm with no intent or plan.  Today she denies suicidal/self-harm/homicidal ideation, psychosis, and paranoia.  Reports that she drinks 4 beers daily along with marijuana use but has never had any withdrawal symptoms.  States she is treated for depression and anxiety by her primary care doctor (medication management) but has no therapy services.  States she has had therapy in the past but stopped.  States she doesn't feel she need substance abuse services but will take the resources.  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  States she lives with her boyfriend (father of child) and her 58 yr old daughter. Discussed outpatient psychiatric services and substance use services. Agrees to do walk in at [facility] if unable to get an appointment.   During evaluation [name] is lying in bed on her side with no noted distress or withdrawal symptoms.  She is alert, oriented x 4, calm, cooperative, attentive, and her mood is dysphoric with congruent affect.  She has normal speech, and behavior.  Objectively there is no evidence of psychosis/mania or delusional thinking.  She is able to converse coherently, goal directed thoughts, no distractibility, or pre-occupation.  She also denies suicidal/self-harm/homicidal ideation, psychosis, and paranoia.  She responded appropriately to questions.  Gave permission to speak to her sister [name] for collateral information  Collateral Information:  Spoke with [name] at [phone].  Sister states she brought patient to hospital because she is drinking every day, taking pain pills, and valium.  States that patient has fallen several times and feels that her sister needs to get help to come off of the valium, pain pills, and alcohol.  States all the medications that the patient takes is prescribed by her doctor but it is to much.  Informed that conversation with PCP may be needed and if patient wanted to go to detox or rehab it was voluntary but would give resources that they could sit down and have a family meeting to get patient into treatment.  Patient sister states she will be picking up.    Stay Summary: [name] was admitted for MDD (major depressive disorder), recurrent severe, without psychosis ([place]), crisis management, safety, and stabilization.  Medical problems were identified and treated as needed.  Home medications were restarted, adjusted, or new medications added as needed or appropriate.  Medications treated with during admission are as follows.  atomoxetine  60 mg Oral  Daily   busPIRone  10 mg Oral TID   losartan  50 mg Oral Daily   OLANZapine  10 mg Oral QHS   pantoprazole  40 mg Oral QAC breakfast   Labs ordered/reviewed: Lab Orders         Hemoglobin A1c         Lipid panel          TSH         Pregnancy, urine         Pregnancy, urine POC     Improvement was monitored by observation and [name] 's verbal report of emotional status and symptom reduction along with clinical staff report.  [name] was evaluated by the treatment team for stability and plans for continued recovery upon discharge. [name] 's motivation was an integral factor for scheduling further treatment. Employment, transportation, bed availability, health status, family support, and any pending legal issues were also considered during stay. She was offered further treatment options upon discharge including but not limited to Residential, Intensive Outpatient, and Outpatient treatment.   [name] will follow up with  [place]. Go on 02/06/2022.   Specialty: Urgent Care Why: You have an appointment with Melissa Brittle, MD on 02/06/22 at 1:00 PM for medication management Contact information: [place]. Go on 02/12/2022.   Specialty: Urgent Care Why: Your have an appointment with [name] on 02/12/22 at 2:00 PM for therapy Contact information: [place], [facility].   Specialty: Behavioral Health Why: [facility] Triage Times (no appointment necessary).  (offers outpatient therapy and intensive outpatient substance abuse therapy). Contact information: [place] [address] [phone]         Call  [place].   Specialty: Professional Counselor Why: schedule an appointment for medication management and therapy Contact information: [employer] [facility] [place] [address] [place], [facility].   Why:  Keep scheduled appointment for medication management. Contact information: [address] [place] [address] [phone]                  Discharge Instructions       If you need to be seen prior to scheduled appointment you may come during walk in hours for intake assessment at [facility].  See hours below.    [facility]: Outpatient psychiatric Services  New Patient Assessment and Therapy Walk-in Monday thru Thursday 8:00 am first come first serve until slots are full Every Friday from 1:00 pm to 4:00 pm first come first serve until slots are full  New Patient Psychiatric Medication Management Monday thru Friday from 8:00 am to 11:00 am first come first served until slots are full  For all walk-ins we ask that you arrive by 7:15 am because patients will be seen in there order of arrival.   Availability is limited, and therefore you may not be seen on the same day that you walk in.  Our goal is to serve and meet the needs of our community to the best of our ability.  Substance Abuse Treatment Programs  Intensive Outpatient Programs [facility]     [address], [place]       [facility] [address] [place], [place]  [facility]     (Inpatient and outpatient)     [address]           [place] [phone] (Suboxone and Methadone)  [place], [place] [address]      [facility] [address] [phone] [place], [place]  [facility] (Outpatient/Inpatient, Chemical)    (insurance only) [phone]             [facility] ([place]) [place], [place]     [facility]     [address]     [place],  [place]       [facility] (for caregivers and family) [phone] [address] Kristeen Mans. [place], [place]      Residential Treatment Programs [facility]      [address], [place], [place] [address]  [phone]       [facility] [address], [place], [place] [address] [phone]  [facility]        [phone]       [facility] [phone]  [facility] ([place]             [place], [place] or [place] of [place], [address] [phone]  [facility] [place]    [address]      [place], [place]       [facility] [address] [place], [place]  [place]   [address] [place], [place] [address]     [phone]      Admissions: 8am-3pm M-F  [facility] ([facility]) [address] [place], [place]  [facility]: Residential Program [phone] Days)   [place], [place] or [phone]     [facility]: [facility] [place], [place] (Walk in Hours over the weekend or by referral)  [facility] [place], [place],  [address] [phone]  Crisis Mobile: [facility]:  [phone] (for crisis response 24 hours a day) [facility] Hotline:      [phone] Outpatient Psychiatry and Counseling  [facility]: Mobile Crisis  Management 24 hours:  [phone]  [facility] of the [employer] sliding scale fee and walk in schedule: M-F 8am-12pm/1pm-3pm [address]  [place], [place] [address] [phone]  [facility] [address] [place], [place] [address] [phone]  [facility] (Formerly known  as [facility])- new patient walk-in appointments available Monday - Friday 8am -3pm.          [address] [place], [place] [address] [phone] or crisis line- [phone]  [facility]/ Intensive Outpatient Therapy Program [address] [place], [place] [address] [phone]  [facility] Mental Health                  Crisis Services      [phone]     [place], [place] [address]                 [facility]   [facility] [phone]. [address] [place], [place] [address]   [facility]          [address] [place]  [place], [place] [address]       [phone]  [facility] [address] [place], [place] [address] [phone]  [facility]    [address]    [place], [place] [address]     [phone]       Andee Poles, MD     [address]     [place] [address]     [phone]       [facility] [address] [place] [address]  [facility]     [address] [place], [place]      [phone]       [facility] Eulogio Ditch, MD [address] [place], [place] [address] [phone]  [facility]     [address]     [place], [place] [address]     [phone]       [facility] [address] [place], [place] [address] [phone] Resources for Temporary Residential Assistance/Crisis Centers  DAY CENTERS [facility]) M-F 8am-3pm   [address] [place], [place] [address]   [phone] Services include: laundry, barbering, support groups, case management, phone  & computer access, showers, AA/NA mtgs, mental health/substance abuse nurse, job skills class, disability information, VA assistance, spiritual classes, etc.    HOMELESS SHELTERS  [facility]     [facility]   [address], [place]     [phone]              [employer] (women and children)       [address] [place], [place] [address] [phone] [email or web address] for application and process Application Required  [facility] [employer] [facility]   [address]    [place] [address]     [phone]                    [facility] [address] [place], [place] [address] [phone] application appt.) Application Required  [facility] (women only)    [address]     [place], [place] [address]     [phone]  Intake starts 6pm daily Need valid ID, SSC, & Police report [facility] [employer] [facility] [address] [place], [place] [phone] Application Required  [employer] (men only)     [place].      [place], [place]       [place] (Pregnant women only) [address] [place], [place]  [facility]      [place]      [place], [place] [address]     [place] [address] [place], [place] 90 day commitment/SA/Application process  [facility](men only)     [address]     [place], [place]       Check-in at [facility] [address] [place], [place] [address] [phone] Men/Women/Women and Children must be there by 7 pm  [place], [place]                       Upon completion of this admission [name] was both mentally and medically stable for discharge denying suicidal/homicidal ideation, auditory/visual/tactile hallucinations, delusional thoughts, and paranoia.    Total Time spent with patient: 45 minutes  Past Psychiatric  History: Major depressive disorder, anxiety Past Medical History:  Past Medical History:  Diagnosis Date   Anxiety    Asthma    Depression    doing good, not on meds now   GERD (gastroesophageal reflux disease)    Headache    migraines   History of kidney stones    Hypertension    Kidney stones    Missed abortion 10/02/2013   Ovarian cyst    Pregnancy induced hypertension    Suicidal intent    Vaginal Pap smear, abnormal    bx, f/u ok    Past Surgical History:  Procedure Laterality Date   CESAREAN SECTION     CESAREAN SECTION N/A 06/06/2015   Procedure: CESAREAN SECTION;  Surgeon: Frederico Hamman, MD;  Location: [facility];  Service: Obstetrics;  Laterality: N/A;   CHOLECYSTECTOMY N/A 08/23/2016   Procedure: LAPAROSCOPIC CHOLECYSTECTOMY;  Surgeon: Clovis Riley, MD;  Location: [facility];  Service: General;  Laterality: N/A;   DILATION AND CURETTAGE OF UTERUS     DILATION AND EVACUATION N/A 10/04/2013   Procedure: DILATATION AND EVACUATION;  Surgeon: Janyth Contes, MD;  Location: [facility];  Service: Gynecology;  Laterality: N/A;  [place] in room    TONSILLECTOMY     TUBAL LIGATION     Family History:  Family History  Problem Relation Age of Onset   Hearing loss Mother    Asthma Mother    Asthma Father    Diabetes Son    Cancer Maternal Grandfather        bone   Liver disease Maternal Grandfather    Asthma Sister    Lupus Sister    Family Psychiatric History: None reported Social History:  Social History   Substance and Sexual Activity  Alcohol Use Yes   Alcohol/week: 4.0 standard drinks of alcohol   Types: 4 Cans of beer per week     Social History   Substance and Sexual Activity  Drug Use No  Social History   Socioeconomic History   Marital status: Single    Spouse name: Not on file   Number of children: Not on file   Years of education: Not on file   Highest education level: Not on file  Occupational History   Not on file  Tobacco Use   Smoking  status: Every Day    Packs/day: 0.25    Years: 15.00    Total pack years: 3.75    Types: Cigarettes   Smokeless tobacco: Never  Vaping Use   Vaping Use: Never used  Substance and Sexual Activity   Alcohol use: Yes    Alcohol/week: 4.0 standard drinks of alcohol    Types: 4 Cans of beer per week   Drug use: No   Sexual activity: Yes    Partners: Male    Birth control/protection: Surgical  Other Topics Concern   Not on file  Social History Narrative   Not on file   Social Determinants of Health   Financial Resource Strain: Not on file  Food Insecurity: Not on file  Transportation Needs: Not on file  Physical Activity: Not on file  Stress: Not on file  Social Connections: Not on file   SDOH:  SDOH Screenings   Alcohol Screen: Low Risk  (07/20/2017)  Depression (PHQ2-9): Medium Risk (01/30/2022)  Tobacco Use: High Risk (01/30/2022)    Tobacco Cessation:  A prescription for an FDA-approved tobacco cessation medication was offered at discharge and the patient refused  Current Medications:  Current Facility-Administered Medications  Medication Dose Route Frequency Provider Last Rate Last Admin   acetaminophen (TYLENOL) tablet 650 mg  650 mg Oral Q6H PRN Onuoha, Chinwendu V, NP       albuterol (VENTOLIN HFA) 108 (90 Base) MCG/ACT inhaler 2 puff  2 puff Inhalation Q6H PRN Onuoha, Chinwendu V, NP   2 puff at 01/30/22 0159   alum & mag hydroxide-simeth (MAALOX/MYLANTA) 200-200-20 MG/5ML suspension 30 mL  30 mL Oral Q4H PRN Onuoha, Chinwendu V, NP       atomoxetine (STRATTERA) capsule 60 mg  60 mg Oral Daily Onuoha, Chinwendu V, NP   60 mg at 01/30/22 0900   busPIRone (BUSPAR) tablet 10 mg  10 mg Oral TID Onuoha, Chinwendu V, NP   10 mg at 01/30/22 0900   hydrOXYzine (ATARAX) tablet 25 mg  25 mg Oral TID PRN Onuoha, Chinwendu V, NP   25 mg at 01/30/22 0215   losartan (COZAAR) tablet 50 mg  50 mg Oral Daily Onuoha, Chinwendu V, NP   50 mg at 01/30/22 0618   magnesium hydroxide (MILK  OF MAGNESIA) suspension 30 mL  30 mL Oral Daily PRN Onuoha, Chinwendu V, NP       mometasone-formoterol (DULERA) 200-5 MCG/ACT inhaler 2 puff  2 puff Inhalation BID PRN Onuoha, Chinwendu V, NP       nicotine (NICODERM CQ - dosed in mg/24 hours) patch 21 mg  21 mg Transdermal Daily PRN Onuoha, Chinwendu V, NP       OLANZapine (ZYPREXA) tablet 10 mg  10 mg Oral QHS Onuoha, Chinwendu V, NP   10 mg at 01/30/22 0215   pantoprazole (PROTONIX) EC tablet 40 mg  40 mg Oral QAC breakfast Onuoha, Chinwendu V, NP   40 mg at 01/30/22 0900   Current Outpatient Medications  Medication Sig Dispense Refill   albuterol (PROAIR HFA) 108 (90 Base) MCG/ACT inhaler INHALE 1-2 PUFFS INTO THE LUNGS EVERY 6 (SIX) HOURS AS NEEDED FOR WHEEZING OR  SHORTNESS OF BREATH. (Patient taking differently: Inhale 2 puffs into the lungs every 6 (six) hours as needed for wheezing or shortness of breath.) 8.5 g 0   atomoxetine (STRATTERA) 60 MG capsule Take 60 mg by mouth daily.     buPROPion (WELLBUTRIN SR) 150 MG 12 hr tablet Take 1 tablet (150 mg total) by mouth in the morning and at bedtime.     busPIRone (BUSPAR) 10 MG tablet Take 10 mg by mouth 3 (three) times daily.     diazepam (VALIUM) 10 MG tablet Take 5-10 mg by mouth every 8 (eight) hours as needed for anxiety.     dicyclomine (BENTYL) 10 MG capsule Take 10 mg by mouth 3 (three) times daily before meals.     folic acid (FOLVITE) 1 MG tablet Take 1 mg by mouth daily.     losartan (COZAAR) 50 MG tablet Take 50 mg by mouth daily.     meloxicam (MOBIC) 15 MG tablet Take 15 mg by mouth daily.     montelukast (SINGULAIR) 10 MG tablet Take 10 mg by mouth at bedtime.     nicotine (NICODERM CQ - DOSED IN MG/24 HOURS) 21 mg/24hr patch Place 21 mg onto the skin daily as needed (for smoking cessation).     OLANZapine (ZYPREXA) 10 MG tablet Take 10 mg by mouth at bedtime.     pantoprazole (PROTONIX) 40 MG tablet TAKE 1 TABLET BY MOUTH EVERY DAY (Patient taking differently: Take 40 mg by  mouth daily before breakfast.) 30 tablet 0   SYMBICORT 160-4.5 MCG/ACT inhaler Inhale 2 puffs into the lungs 2 (two) times daily.     venlafaxine XR (EFFEXOR-XR) 37.5 MG 24 hr capsule Take 37.5 mg by mouth daily with breakfast.      PTA Medications: (Not in a hospital admission)      01/30/2022   11:28 AM 03/13/2020   10:25 AM 08/16/2019    2:19 PM  Depression screen PHQ 2/9  Decreased Interest 1 3 3   Down, Depressed, Hopeless 1 3 3   PHQ - 2 Score 2 6 6   Altered sleeping 1 3 0  Tired, decreased energy 1 3 0  Change in appetite 0 3 0  Feeling bad or failure about yourself  1 2 0  Trouble concentrating 0 3 0  Moving slowly or fidgety/restless 0 2 0  Suicidal thoughts 1 0 0  PHQ-9 Score 6 22 6   Difficult doing work/chores Somewhat difficult  Very difficult    Flowsheet Row ED from 01/30/2022 in [facility] from [facility]/29/20[facility] to Hosp-Admission (Discharged) from 08/29/2021 in [facility] 4E CV SURGICAL PROGRESSIVE CARE  C-SSRS RISK CATEGORY Error: Q7 should not be populated when Q6 is No High Risk No Risk       Musculoskeletal  Strength & Muscle Tone: within normal limits Gait & Station: normal Patient leans: N/A  Psychiatric Specialty Exam  Presentation  General Appearance: Appropriate for Environment  Eye Contact:Good  Speech:Clear and Coherent; Normal Rate  Speech Volume:Normal  Handedness:Right   Mood and Affect  Mood:Dysphoric  Affect:Congruent   Thought Process  Thought Processes:Coherent; Goal Directed  Descriptions of Associations:Intact  Orientation:Full (Time, Place and Person)  Thought Content:Logical     Hallucinations:Hallucinations: None  Ideas of Reference:None  Suicidal Thoughts:Suicidal Thoughts: No (Denies at this time) SI Passive Intent and/or Plan: Without Plan  Homicidal Thoughts:Homicidal Thoughts: No   Sensorium  Memory:Immediate Good; Recent Good;  Remote Good  Judgment:Intact  Insight:Present   Executive Functions  Concentration:Good  Attention Span:Good  [name]:Good  Language:Good   Psychomotor Activity  Psychomotor Activity:Psychomotor Activity: Normal   Assets  Assets:Communication Skills; Desire for Improvement; Financial Resources/Insurance; Housing; Physical Health; Resilience; Social Support; Transportation   Sleep  Sleep:Sleep: Fair   Nutritional Assessment (For OBS and FBC admissions only) Has the patient had a weight loss or gain of 10 pounds or more in the last 3 months?: No Has the patient had a decrease in food intake/or appetite?: No Does the patient have dental problems?: No Does the patient have eating habits or behaviors that may be indicators of an eating disorder including binging or inducing vomiting?: No Has the patient recently lost weight without trying?: 0 Has the patient been eating poorly because of a decreased appetite?: 0 Malnutrition Screening Tool Score: 0 Nutritional Assessment Referrals: Medication/Tx changes    Physical Exam  Physical Exam Vitals and nursing note reviewed. Exam conducted with a chaperone present.  Constitutional:      General: She is not in acute distress.    Appearance: Normal appearance. She is not ill-appearing.  Eyes:     Pupils: Pupils are equal, round, and reactive to light.  Cardiovascular:     Rate and Rhythm: Normal rate.  Pulmonary:     Effort: Pulmonary effort is normal.  Musculoskeletal:        General: Normal range of motion.     Cervical back: Normal range of motion.  Skin:    General: Skin is warm and dry.  Neurological:     Mental Status: She is alert and oriented to person, place, and time.  Psychiatric:        Attention and Perception: Attention and perception normal.        Mood and Affect: Affect normal. Depressed: dysphoric.        Speech: Speech normal.        Behavior: Behavior normal. Behavior is  cooperative.        Thought Content: Thought content normal. Thought content is not paranoid. Thought content does not include homicidal or suicidal ideation.        Cognition and Memory: Cognition and memory normal.        Judgment: Judgment normal.    Review of Systems  Constitutional: Negative.   HENT: Negative.    Eyes: Negative.   Respiratory: Negative.    Cardiovascular: Negative.   Gastrointestinal: Negative.   Genitourinary: Negative.   Musculoskeletal: Negative.   Skin: Negative.   Neurological: Negative.   Endo/Heme/Allergies: Negative.   Psychiatric/Behavioral:  Positive for substance abuse (alcohol 4 beers daily and THC). Depression: stable. Hallucinations: Denies. Suicidal ideas: Denies at this time..Nervous/anxious: Stable. Insomnia: Sleep ios fair.    Blood pressure (!) 130/101, pulse 81, temperature 98.5 F (36.9 C), temperature source Oral, resp. rate 16, SpO2 94 %. There is no height or weight on file to calculate BMI.  Demographic Factors:  Caucasian  Loss Factors: NA  Historical Factors: NA  Risk Reduction Factors:   Responsible for children under 32 years of age, Sense of responsibility to family, Religious beliefs about death, Employed, Living with another person, especially a relative, and Positive social support  Continued Clinical Symptoms:  Alcohol/Substance Abuse/Dependencies Previous Psychiatric Diagnoses and Treatments  Cognitive Features That Contribute To Risk:  None    Suicide Risk:  Minimal: No identifiable suicidal ideation.  Patients presenting with no risk factors but with morbid ruminations; may be classified as minimal risk  based on the severity of the depressive symptoms  Plan Of Care/Follow-up recommendations:  Other:  Follow up with resources given for medication management and counseling services.  Substance abuse services also given  Disposition: No evidence of imminent risk to self or others at present.   Patient does not  meet criteria for psychiatric inpatient admission. Supportive therapy provided about ongoing stressors. Refer to IOP. Discussed crisis plan, support from social network, calling 911, coming to the Emergency Department, and calling Suicide Hotline.   Cheney Ewart, NP 01/30/2022, 12:32 PM

## 2022-01-30 NOTE — ED Notes (Signed)
[facility] CALLED FOR LAB TO [place]

## 2022-01-30 NOTE — ED Notes (Signed)
Blood pressure was taken and read 146/107. NP [place] was notified of elevated blood pressure and was prescribe medication(see Mar). Will administer and recheck
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
## 2022-01-31 ENCOUNTER — Telehealth (HOSPITAL_COMMUNITY): Payer: Self-pay | Admitting: Adult Health Nurse Practitioner

## 2022-01-31 NOTE — BH Assessment (Signed)
Care Management - [facility] Follow Up Discharges  ° °Writer attempted to make contact with patient today and was unsuccessful.  Writer left a HIPPA compliant voice message.  ° °Per chart review, patient was provided with outpatient resources. ° °

## 2022-02-06 ENCOUNTER — Ambulatory Visit (HOSPITAL_COMMUNITY): Payer: Medicaid Other | Admitting: Student

## 2022-02-12 ENCOUNTER — Ambulatory Visit (HOSPITAL_COMMUNITY): Payer: Medicaid Other | Admitting: Licensed Clinical Social Worker

## 2022-02-20 ENCOUNTER — Encounter (HOSPITAL_COMMUNITY): Payer: Self-pay

## 2022-02-20 ENCOUNTER — Encounter (HOSPITAL_COMMUNITY): Payer: Medicaid Other | Admitting: Psychiatry

## 2022-02-20 NOTE — Progress Notes (Signed)
Patient did not log in to video visit for scheduled psychiatry intake on 02/20/22 at 1PM. Direct video link sent. Called patient's phone with no answer; left VM to reschedule.  Alda Berthold, MD 02/20/22

## 2022-03-07 ENCOUNTER — Encounter: Payer: Self-pay | Admitting: Internal Medicine

## 2022-03-07 ENCOUNTER — Ambulatory Visit (INDEPENDENT_AMBULATORY_CARE_PROVIDER_SITE_OTHER): Payer: Medicaid Other | Admitting: Internal Medicine

## 2022-03-07 VITALS — BP 130/90 | HR 79 | Ht 64.0 in | Wt 185.8 lb

## 2022-03-07 DIAGNOSIS — Z01811 Encounter for preprocedural respiratory examination: Secondary | ICD-10-CM

## 2022-03-07 DIAGNOSIS — Z716 Tobacco abuse counseling: Secondary | ICD-10-CM

## 2022-03-07 DIAGNOSIS — J4489 Other specified chronic obstructive pulmonary disease: Secondary | ICD-10-CM | POA: Diagnosis not present

## 2022-03-07 DIAGNOSIS — F17210 Nicotine dependence, cigarettes, uncomplicated: Secondary | ICD-10-CM

## 2022-03-07 MED ORDER — SPIRIVA RESPIMAT 2.5 MCG/ACT IN AERS: 2.0000 | INHALATION_SPRAY | Freq: Every day | RESPIRATORY_TRACT | 5 refills | Status: DC

## 2022-03-07 NOTE — Patient Instructions (Addendum)
Please schedule follow up scheduled with APP in 3 months.  If my schedule is not open yet, we will contact you with a reminder closer to that time. Please call [phone] if you haven't heard from [place] a month before.   Before your next visit I would like you to have: Keep taking symbicort 2 puffs twice a day. Adding spiriva 2 puffs once a day in the morning.  I will send a letter to your surgeon about our visit today.   Quit smoking! You can do it! It will help your surgery be more successful without breathing complications.  What are the benefits of quitting smoking? Quitting smoking can lower your chances of getting or dying from heart disease, lung disease, kidney failure, infection, or cancer. It can also lower your chances of getting osteoporosis, a condition that makes your bones weak. Plus, quitting smoking can help your skin look younger and reduce the chances that you will have problems with sex.  Quitting smoking will improve your health no matter how old you are, and no matter how long or how much you have smoked.  What should I do if I want to quit smoking? The letters in the word "START" can help you remember the steps to take: S = Set a quit date. T = Tell family, friends, and the people around you that you plan to quit. A = Anticipate or plan ahead for the tough times you'll face while quitting. R = Remove cigarettes and other tobacco products from your home, car, and work. T = Talk to your doctor about getting help to quit.  How can my doctor or nurse help? Your doctor or nurse can give you advice on the best way to quit. He or she can also put you in touch with counselors or other people you can call for support. Plus, your doctor or nurse can give you medicines to: ?Reduce your craving for cigarettes ?Reduce the unpleasant symptoms that happen when you stop smoking (called "withdrawal symptoms"). You can also get help from a free phone line (1-800-QUIT-NOW) or go online  to [facility].  What are the symptoms of withdrawal? The symptoms include: ?Trouble sleeping ?Being irritable, anxious or restless ?Getting frustrated or angry ?Having trouble thinking clearly  Some people who stop smoking become temporarily depressed. Some people need treatment for depression, such as counseling or antidepressant medicines. Depressed people might: ?No longer enjoy or care about doing the things they used to like to do ?Feel sad, down, hopeless, nervous, or cranky most of the day, almost every day ?Lose or gain weight ?Sleep too much or too little ?Feel tired or like they have no energy ?Feel guilty or like they are worth nothing ?Forget things or feel confused ?Move and speak more slowly than usual ?Act restless or have trouble staying still ?Think about death or suicide  If you think you might be depressed, see your doctor or nurse. Only someone trained in mental health can tell for sure if you are depressed. If you ever feel like you might hurt yourself, go straight to the nearest emergency department. Or you can call for an ambulance (in the [place] and [place], [place] 9-1-1) or call your doctor or nurse right away and tell them it is an emergency. You can also reach the [place] National Suicide Prevention Lifeline at 267-775-2461 or http://walker-sanchez.info/.  How do medicines help you stop smoking? Different medicines work in different ways: ?Nicotine replacement therapy eases withdrawal and reduces your body's  craving for nicotine, the main drug found in cigarettes. There are different forms of nicotine replacement, including skin patches, lozenges, gum, nasal sprays, and "puffers" or inhalers. Many can be bought without a prescription, while others might require one. ?Bupropion is a prescription medicine that reduces your desire to smoke. This medicine is sold under the brand names Zyban and Wellbutrin. It is also available in a generic version, which is  cheaper than brand name medicines. ?Varenicline (brand names: Chantix, Champix) is a prescription medicine that reduces withdrawal symptoms and cigarette cravings. If you think you'd like to take varenicline and you have a history of depression, anxiety, or heart disease, discuss this with your doctor or nurse before taking the medicine. Varenicline can also increase the effects of alcohol in some people. It's a good idea to limit drinking while you're taking it, at least until you know how it affects you.  How does counseling work? Counseling can happen during formal office visits or just over the phone. A counselor can help you: ?Figure out what triggers your smoking and what to do instead ?Overcome cravings ?Figure out what went wrong when you tried to quit before  What works best? Studies show that people have the best luck at quitting if they take medicines to help them quit and work with a Veterinary surgeon. It might also be helpful to combine nicotine replacement with one of the prescription medicines that help people quit. In some cases, it might even make sense to take bupropion and varenicline together.  What about e-cigarettes? Sometimes people wonder if using electronic cigarettes, or "e-cigarettes," might help them quit smoking. Using e-cigarettes is also called "vaping." Doctors do not recommend e-cigarettes in place of medicines and counseling. That's because e-cigarettes still contain nicotine as well as other substances that might be harmful. It's not clear how they can affect a person's health in the long term.  Will I gain weight if I quit? Yes, you might gain a few pounds. But quitting smoking will have a much more positive effect on your health than weighing a few pounds more. Plus, you can help prevent some weight gain by being more active and eating less. Taking the medicine bupropion might help control weight gain.   What else can I do to improve my chances of quitting? You  can: ?Start exercising. ?Stay away from smokers and places that you associate with smoking. If people close to you smoke, ask them to quit with you. ?Keep gum, hard candy, or something to put in your mouth handy. If you get a craving for a cigarette, try one of these instead. ?Don't give up, even if you start smoking again. It takes most people a few tries before they succeed.  What if I am pregnant and I smoke? If you are pregnant, it's really important for the health of your baby that you quit. Ask your doctor what options you have, and what is safest for your baby

## 2022-03-07 NOTE — Progress Notes (Signed)
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   Interval Updates: Here for follow up after one year.  Currently on symbicort 2 puffs twice a day. Here for pre-operative evaluation for foot surgery - right foot. Had traumatic injury while barefoot in the bathroom.  Had surgery in April at an outpatient surgical center and she ended up being hospitalized for flare of COPD. Was hospitalized for three days. Ended up going home without oxygen but briefly needed bipap.   Still smoking but has cut back from 1ppd to less than 1/2 ppd but gradually cutting back. Some days she wears a patch and doesn't smoke at all.   Current Regimen: symbicort 2 puffs twice a day.  Asthma Triggers: cold weather/hot weather, animals, cigarettes, anxiety Exacerbations in the last year: one History of hospitalization or intubation: hospitalized in April 2023 post-op for copd exacerbation Atopy: none Allergy Testing: never had.  GERD: yes controlled on PPI Allergic Rhinitis:yes ACT:  Asthma Control Test ACT Total Score  03/27/2021  3:53 PM 12  07/15/2019  4:04 PM 11  03/24/2019  2:29 PM 7   FeNO: n/a  I have reviewed the patient's family social and past medical history and updated as appropriate.   Past Medical History:  Diagnosis Date   Anxiety    Asthma    Depression    doing good, not on meds now   GERD (gastroesophageal reflux disease)    Headache    migraines   History of kidney stones    Hypertension    Kidney stones    Missed abortion 10/02/2013   Ovarian cyst    Pregnancy induced hypertension    Suicidal intent    Vaginal Pap smear, abnormal    bx, f/u ok     Past Surgical History:  Procedure Laterality Date   CESAREAN SECTION     CESAREAN SECTION N/A 06/06/2015   Procedure: CESAREAN SECTION;  Surgeon: Frederico Hamman, MD;  Location: [facility];  Service: Obstetrics;  Laterality: N/A;   CHOLECYSTECTOMY N/A 08/23/2016   Procedure: LAPAROSCOPIC CHOLECYSTECTOMY;  Surgeon: Clovis Riley, MD;  Location: [facility];  Service: General;  Laterality: N/A;   DILATION AND CURETTAGE OF UTERUS     DILATION AND EVACUATION N/A 10/04/2013   Procedure: DILATATION AND EVACUATION;  Surgeon: Janyth Contes, MD;  Location: [facility];  Service: Gynecology;  Laterality: N/A;  [place] in room    TONSILLECTOMY     TUBAL LIGATION      Family History  Problem Relation Age of Onset   Hearing loss Mother    Asthma Mother    Asthma Father    Diabetes Son    Cancer Maternal Grandfather        bone   Liver disease Maternal Grandfather    Asthma Sister    Lupus Sister     Social History   Occupational History   Not on file  Tobacco Use   Smoking status: Every Day    Packs/day: 0.25    Years: 15.00    Total pack years: 3.75    Types: Cigarettes   Smokeless tobacco: Never   Tobacco comments:  Smoking 2-3 cigs a day. 03/07/2022 Tay  Vaping Use   Vaping Use: Never used  Substance and Sexual Activity   Alcohol use: Yes    Alcohol/week: 4.0 standard drinks of alcohol    Types: 4 Cans of beer per week   Drug use: No   Sexual activity: Yes    Partners: Male    Birth control/protection: Surgical    Physical Exam: Blood pressure (!) 130/90, pulse 79, height 5\' 4"  (1.626 m), weight 185 lb 12.8 oz (84.3 kg), SpO2 98 %.  Gen:      NAD ENT: mallampati IV Lungs:   diminished no wheezes or crackles CV:         RRR no mrg  Data Reviewed: Imaging: I have personally reviewed the chest xray from 02/2019 which demonstrates no acute cardiopulmonary process  PFTs:     Latest Ref Rng & Units 11/16/2015   12:47 PM  PFT Results  FVC-Pre L 3.54   FVC-Predicted  Pre % 94   Pre FEV1/FVC % % 55   FEV1-Pre L 1.96   FEV1-Predicted Pre % 62   DLCO uncorrected ml/min/mmHg 23.29   DLCO UNC% % 95   DLVA Predicted % 97   TLC L 8.11   TLC % Predicted % 160   RV % Predicted % 292    I have personally reviewed the patient's PFTs and which demonstrate moderate airflow limitation   Labs:  Immunization status: Immunization History  Administered Date(s) Administered   Influenza Inj Mdck Quad Pf 04/04/2021, 02/19/2022   Influenza,inj,Quad PF,6+ Mos 07/29/2014, 06/07/2015, 07/21/2017, 02/05/2018, 03/03/2019   Influenza-Unspecified 03/24/2012, 07/29/2014, 06/07/2015, 07/21/2017, 02/05/2018, 03/03/2019   Pneumococcal Polysaccharide-23 07/29/2014, 06/08/2015   Pneumococcal-Unspecified 07/29/2014, 06/08/2015   Tdap 06/07/2015    Assessment:  Asthma COPD overlap syndrome Tobacco use disorder, with cessation counseling.  GERD Pre-operative pulmonary evaluation  Plan/Recommendations: Continue symbicort 2 puffs twice a day. Continue albuterol, use is twice a day.  Will add spiriva respimat 2 puffs once a day.  Smoking cessation is paramount, discussed in detail. I recommend she stop smoking today.  For her I think extubation from ventilator to bipap would be helpful in addition to smoking cessation.    ASSESSMENT AND RECOMMENDATIONS:  Preoperative Risk Calculation: The features of this patient's history that contribute to the pulmonary risk assessment include: COPD, General anesthesia  This patient has a low risk of post-operative pulmonary complications by ARISCAT Index.  The absolute assessment of risk/benefit of the procedure is deferred to the primary team's evaluation.  - Patient's Estimated risk of postoperative respiratory failure is 1.6% based on the ARISCAT Index.   0 to 25 points: Low risk: [id number] pulmonary complication rate  26 to 44 points: Intermediate risk: 0000000 pulmonary complication rate  45 to 123 points: High risk: [id number] pulmonary  complication rate  Postoperative respiratory failure (PRF) is considered as failure to wean from mechanical ventilation within 48 hours of surgery or unplanned intubation/reintubation postoperatively. The validated risk calculator provides a risk estimate of PRF and is anticipated to aid in surgical decision-making and informed patient consent.  However risk can be accepted given the potential benefit of this intervention and it is not prohibitive.  RECOMMENDATIONS:  In order to minimize the risk of complications and optimize pulmonary status, we recommend the following:  - Encourage aggressive incentive spirometry hourly both peri-operatively and post-operatively as tolerated  - Early ambulation and physical therapy as tolerated post-operatively - Bronchodilators as needed for wheezing or shortness of breath -  Oral steroids only if the patient appears to have component of COPD exacerbation, otherwise no routine utilization needed - Ideally we recommend smoking cessation for >4 weeks before any intervention - Intraoperatively keep OR time to the shortest as possible - Post operatively may benefit from Nocturnal Bipap   ARISCAT: Mazo et al. Anesthesiology 2014; [phone]   Smoking Cessation Counseling:  1. The patient is an everyday smoker and symptomatic due to the following condition asthma copd overlap syndrome 2. The patient is currently pre-contemplative in quitting smoking. 3. I advised patient to quit smoking. 4. We identified patient specific barriers to change.  5. I personally spent 3 minutes counseling the patient regarding tobacco use disorder. 6. We discussed management of stress and anxiety to help with smoking cessation, when applicable. 7. We discussed nicotine replacement therapy, Wellbutrin, Chantix as possible options. 8. I advised setting a quit date. 9. Follow?up arranged with our office to continue ongoing discussions. 10.Resources given to patient including quit  hotline.    Return to Care: No follow-ups on file.   Lenice Llamas, MD Pulmonary and [place]

## 2022-03-12 ENCOUNTER — Telehealth: Payer: Self-pay | Admitting: Internal Medicine

## 2022-03-12 NOTE — Telephone Encounter (Signed)
Fax received from Dr. Emogene Morgan Daws with [facility] [facility] to perform a Right Foot Arthrodesis, fusion of big toe joint on patient.  Patient needs surgery clearance. Surgery is 04/05/2022. Patient was seen on 03/07/2022. Office protocol is a risk assessment can be sent to surgeon if patient has been seen in 60 days or less.   Sending to Dr Shearon Stalls for risk assessment or recommendations if patient needs to be seen in office prior to surgical procedure.

## 2022-03-12 NOTE — Telephone Encounter (Signed)
OV notes and clearance form have been faxed back to [facility]. Nothing further needed at this time.

## 2022-04-15 ENCOUNTER — Encounter: Payer: Self-pay | Admitting: Internal Medicine

## 2022-04-23 DIAGNOSIS — F17219 Nicotine dependence, cigarettes, with unspecified nicotine-induced disorders: Secondary | ICD-10-CM | POA: Insufficient documentation

## 2022-05-15 ENCOUNTER — Inpatient Hospital Stay (HOSPITAL_COMMUNITY)
Admission: EM | Admit: 2022-05-15 | Discharge: 2022-05-18 | DRG: 193 | Disposition: A | Discharge status: Home / Self Care | Payer: Medicaid Other | Attending: Internal Medicine | Admitting: Internal Medicine

## 2022-05-15 ENCOUNTER — Other Ambulatory Visit: Payer: Self-pay

## 2022-05-15 ENCOUNTER — Emergency Department (HOSPITAL_COMMUNITY): Payer: Medicaid Other

## 2022-05-15 DIAGNOSIS — F32A Depression, unspecified: Secondary | ICD-10-CM | POA: Diagnosis present

## 2022-05-15 DIAGNOSIS — K219 Gastro-esophageal reflux disease without esophagitis: Secondary | ICD-10-CM | POA: Diagnosis present

## 2022-05-15 DIAGNOSIS — I10 Essential (primary) hypertension: Secondary | ICD-10-CM | POA: Diagnosis present

## 2022-05-15 DIAGNOSIS — Z808 Family history of malignant neoplasm of other organs or systems: Secondary | ICD-10-CM

## 2022-05-15 DIAGNOSIS — Z885 Allergy status to narcotic agent status: Secondary | ICD-10-CM

## 2022-05-15 DIAGNOSIS — J4541 Moderate persistent asthma with (acute) exacerbation: Secondary | ICD-10-CM

## 2022-05-15 DIAGNOSIS — Z7951 Long term (current) use of inhaled steroids: Secondary | ICD-10-CM

## 2022-05-15 DIAGNOSIS — J9601 Acute respiratory failure with hypoxia: Secondary | ICD-10-CM

## 2022-05-15 DIAGNOSIS — Z822 Family history of deafness and hearing loss: Secondary | ICD-10-CM

## 2022-05-15 DIAGNOSIS — J45901 Unspecified asthma with (acute) exacerbation: Secondary | ICD-10-CM | POA: Diagnosis present

## 2022-05-15 DIAGNOSIS — Z880 Allergy status to penicillin: Secondary | ICD-10-CM

## 2022-05-15 DIAGNOSIS — F418 Other specified anxiety disorders: Secondary | ICD-10-CM | POA: Diagnosis present

## 2022-05-15 DIAGNOSIS — Z79899 Other long term (current) drug therapy: Secondary | ICD-10-CM

## 2022-05-15 DIAGNOSIS — G43909 Migraine, unspecified, not intractable, without status migrainosus: Secondary | ICD-10-CM | POA: Diagnosis present

## 2022-05-15 DIAGNOSIS — I2489 Other forms of acute ischemic heart disease: Secondary | ICD-10-CM | POA: Diagnosis present

## 2022-05-15 DIAGNOSIS — J101 Influenza due to other identified influenza virus with other respiratory manifestations: Secondary | ICD-10-CM

## 2022-05-15 DIAGNOSIS — J1001 Influenza due to other identified influenza virus with the same other identified influenza virus pneumonia: Principal | ICD-10-CM | POA: Diagnosis present

## 2022-05-15 DIAGNOSIS — Z888 Allergy status to other drugs, medicaments and biological substances status: Secondary | ICD-10-CM

## 2022-05-15 DIAGNOSIS — F419 Anxiety disorder, unspecified: Secondary | ICD-10-CM | POA: Diagnosis present

## 2022-05-15 DIAGNOSIS — Z825 Family history of asthma and other chronic lower respiratory diseases: Secondary | ICD-10-CM

## 2022-05-15 DIAGNOSIS — Z833 Family history of diabetes mellitus: Secondary | ICD-10-CM

## 2022-05-15 DIAGNOSIS — Z1152 Encounter for screening for COVID-19: Secondary | ICD-10-CM

## 2022-05-15 DIAGNOSIS — F17210 Nicotine dependence, cigarettes, uncomplicated: Secondary | ICD-10-CM | POA: Diagnosis present

## 2022-05-15 DIAGNOSIS — J40 Bronchitis, not specified as acute or chronic: Secondary | ICD-10-CM | POA: Diagnosis present

## 2022-05-15 DIAGNOSIS — Z832 Family history of diseases of the blood and blood-forming organs and certain disorders involving the immune mechanism: Secondary | ICD-10-CM

## 2022-05-15 DIAGNOSIS — R7989 Other specified abnormal findings of blood chemistry: Secondary | ICD-10-CM | POA: Diagnosis present

## 2022-05-15 DIAGNOSIS — J4551 Severe persistent asthma with (acute) exacerbation: Secondary | ICD-10-CM | POA: Diagnosis present

## 2022-05-15 DIAGNOSIS — Z87442 Personal history of urinary calculi: Secondary | ICD-10-CM

## 2022-05-15 LAB — CBC WITH DIFFERENTIAL/PLATELET
Abs Immature Granulocytes: 0.05 10*3/uL (ref 0.00–0.07)
Basophils Absolute: 0.1 10*3/uL (ref 0.0–0.1)
Basophils Relative: 1 %
Eosinophils Absolute: 0 10*3/uL (ref 0.0–0.5)
Eosinophils Relative: 0 %
HCT: 46.1 % — ABNORMAL HIGH (ref 36.0–46.0)
Hemoglobin: 13.6 g/dL (ref 12.0–15.0)
Immature Granulocytes: 1 %
Lymphocytes Relative: 9 %
Lymphs Abs: 0.8 10*3/uL (ref 0.7–4.0)
MCH: 30.4 pg (ref 26.0–34.0)
MCHC: 29.5 g/dL — ABNORMAL LOW (ref 30.0–36.0)
MCV: 103.1 fL — ABNORMAL HIGH (ref 80.0–100.0)
Monocytes Absolute: 0.7 10*3/uL (ref 0.1–1.0)
Monocytes Relative: 7 %
Neutro Abs: 7.8 10*3/uL — ABNORMAL HIGH (ref 1.7–7.7)
Neutrophils Relative %: 82 %
Platelets: 228 10*3/uL (ref 150–400)
RBC: 4.47 MIL/uL (ref 3.87–5.11)
RDW: 14.4 % (ref 11.5–15.5)
WBC: 9.3 10*3/uL (ref 4.0–10.5)
nRBC: 0 % (ref 0.0–0.2)

## 2022-05-15 LAB — BASIC METABOLIC PANEL
Anion gap: 5 (ref 5–15)
BUN: 6 mg/dL (ref 6–20)
CO2: 28 mmol/L (ref 22–32)
Calcium: 8.8 mg/dL — ABNORMAL LOW (ref 8.9–10.3)
Chloride: 106 mmol/L (ref 98–111)
Creatinine, Ser: 0.89 mg/dL (ref 0.44–1.00)
GFR, Estimated: >60 mL/min (ref >60)
Glucose, Bld: 110 mg/dL — ABNORMAL HIGH (ref 70–99)
Potassium: 3.7 mmol/L (ref 3.5–5.1)
Sodium: 139 mmol/L (ref 135–145)

## 2022-05-15 LAB — RESP PANEL BY RT-PCR (RSV, FLU A&B, COVID)  RVPGX2
Influenza A by PCR: POSITIVE — AB
Influenza B by PCR: NEGATIVE
Resp Syncytial Virus by PCR: NEGATIVE
SARS Coronavirus 2 by RT PCR: NEGATIVE

## 2022-05-15 LAB — TROPONIN I (HIGH SENSITIVITY)
Troponin I (High Sensitivity): 116 ng/L (ref <18)
Troponin I (High Sensitivity): 92 ng/L — ABNORMAL HIGH (ref <18)

## 2022-05-15 LAB — MAGNESIUM: Magnesium: 2.1 mg/dL (ref 1.7–2.4)

## 2022-05-15 MED ORDER — ENOXAPARIN SODIUM 40 MG/0.4ML IJ SOSY
40.0000 mg | PREFILLED_SYRINGE | INTRAMUSCULAR | Status: DC
  Administered 2022-05-16 – 2022-05-18 (×3): 40 mg via SUBCUTANEOUS
  Filled 2022-05-15 (×3): qty 0.4

## 2022-05-15 MED ORDER — ALBUTEROL SULFATE (2.5 MG/3ML) 0.083% IN NEBU: 2.5000 mg | INHALATION_SOLUTION | Freq: Four times a day (QID) | RESPIRATORY_TRACT | Status: DC | PRN

## 2022-05-15 MED ORDER — MAGNESIUM SULFATE 2 GM/50ML IV SOLN
2.0000 g | Freq: Once | INTRAVENOUS | Status: AC
  Administered 2022-05-15: 2 g via INTRAVENOUS
  Filled 2022-05-15: qty 50

## 2022-05-15 MED ORDER — ONDANSETRON HCL 4 MG/2ML IJ SOLN: 4.0000 mg | Freq: Four times a day (QID) | INTRAMUSCULAR | Status: DC | PRN

## 2022-05-15 MED ORDER — MOMETASONE FURO-FORMOTEROL FUM 200-5 MCG/ACT IN AERO
2.0000 | INHALATION_SPRAY | Freq: Two times a day (BID) | RESPIRATORY_TRACT | Status: DC
  Administered 2022-05-16 – 2022-05-18 (×3): 2 via RESPIRATORY_TRACT
  Filled 2022-05-15: qty 8.8

## 2022-05-15 MED ORDER — ONDANSETRON HCL 4 MG PO TABS: 4.0000 mg | ORAL_TABLET | Freq: Four times a day (QID) | ORAL | Status: DC | PRN

## 2022-05-15 MED ORDER — GUAIFENESIN ER 600 MG PO TB12
600.0000 mg | ORAL_TABLET | Freq: Two times a day (BID) | ORAL | Status: DC
  Administered 2022-05-15 – 2022-05-17 (×4): 600 mg via ORAL
  Filled 2022-05-15 (×5): qty 1

## 2022-05-15 MED ORDER — SODIUM CHLORIDE 0.9% FLUSH
3.0000 mL | Freq: Two times a day (BID) | INTRAVENOUS | Status: DC
  Administered 2022-05-15 – 2022-05-18 (×6): 3 mL via INTRAVENOUS

## 2022-05-15 MED ORDER — IPRATROPIUM-ALBUTEROL 0.5-2.5 (3) MG/3ML IN SOLN
3.0000 mL | Freq: Once | RESPIRATORY_TRACT | Status: AC
  Administered 2022-05-15: 3 mL via RESPIRATORY_TRACT
  Filled 2022-05-15: qty 3

## 2022-05-15 MED ORDER — SENNOSIDES-DOCUSATE SODIUM 8.6-50 MG PO TABS: 1.0000 | ORAL_TABLET | Freq: Every evening | ORAL | Status: DC | PRN

## 2022-05-15 MED ORDER — SODIUM CHLORIDE 0.9 % IV SOLN: INTRAVENOUS | Status: AC

## 2022-05-15 MED ORDER — IPRATROPIUM-ALBUTEROL 0.5-2.5 (3) MG/3ML IN SOLN: 3.0000 mL | Freq: Four times a day (QID) | RESPIRATORY_TRACT | Status: DC

## 2022-05-15 MED ORDER — OSELTAMIVIR PHOSPHATE 75 MG PO CAPS
75.0000 mg | ORAL_CAPSULE | Freq: Two times a day (BID) | ORAL | Status: DC
  Administered 2022-05-15 – 2022-05-18 (×6): 75 mg via ORAL
  Filled 2022-05-15 (×6): qty 1

## 2022-05-15 MED ORDER — ACETAMINOPHEN 500 MG PO TABS
1000.0000 mg | ORAL_TABLET | Freq: Once | ORAL | Status: AC
  Administered 2022-05-15: 1000 mg via ORAL
  Filled 2022-05-15: qty 2

## 2022-05-15 MED ORDER — PREDNISONE 20 MG PO TABS
60.0000 mg | ORAL_TABLET | Freq: Once | ORAL | Status: DC
  Filled 2022-05-15: qty 3

## 2022-05-15 MED ORDER — METHYLPREDNISOLONE SODIUM SUCC 40 MG IJ SOLR
40.0000 mg | Freq: Two times a day (BID) | INTRAMUSCULAR | Status: DC
  Administered 2022-05-15 – 2022-05-18 (×6): 40 mg via INTRAVENOUS
  Filled 2022-05-15 (×6): qty 1

## 2022-05-15 MED ORDER — MONTELUKAST SODIUM 10 MG PO TABS
10.0000 mg | ORAL_TABLET | Freq: Every day | ORAL | Status: DC
  Administered 2022-05-15 – 2022-05-17 (×3): 10 mg via ORAL
  Filled 2022-05-15 (×4): qty 1

## 2022-05-15 MED ORDER — TIOTROPIUM BROMIDE MONOHYDRATE 2.5 MCG/ACT IN AERS: 2.0000 | INHALATION_SPRAY | Freq: Every day | RESPIRATORY_TRACT | Status: DC

## 2022-05-15 MED ORDER — ACETAMINOPHEN 325 MG PO TABS
650.0000 mg | ORAL_TABLET | Freq: Four times a day (QID) | ORAL | Status: DC | PRN
  Administered 2022-05-16 – 2022-05-17 (×2): 650 mg via ORAL
  Filled 2022-05-15 (×2): qty 2

## 2022-05-15 MED ORDER — ACETAMINOPHEN 650 MG RE SUPP: 650.0000 mg | Freq: Four times a day (QID) | RECTAL | Status: DC | PRN

## 2022-05-15 MED ORDER — UMECLIDINIUM BROMIDE 62.5 MCG/ACT IN AEPB
1.0000 | INHALATION_SPRAY | Freq: Every day | RESPIRATORY_TRACT | Status: DC
  Administered 2022-05-16 – 2022-05-18 (×2): 1 via RESPIRATORY_TRACT
  Filled 2022-05-15: qty 7

## 2022-05-15 MED ORDER — KETOROLAC TROMETHAMINE 15 MG/ML IJ SOLN
15.0000 mg | Freq: Once | INTRAMUSCULAR | Status: AC
  Administered 2022-05-15: 15 mg via INTRAVENOUS
  Filled 2022-05-15: qty 1

## 2022-05-15 NOTE — Assessment & Plan Note (Signed)
Start Tamiflu, IV fluid hydration overnight.

## 2022-05-15 NOTE — Assessment & Plan Note (Signed)
Holding antihypertensives for now. °

## 2022-05-15 NOTE — Assessment & Plan Note (Signed)
Acute asthma exacerbation Influenza A Presenting with acute asthma exacerbation triggered by influenza A with likely superimposed bronchitis.  SpO2 was 84% on room air at outside facility, currently stable on 2 L O2 via [place]. -Start IV Solu-Medrol 40 mg twice daily -Scheduled DuoNebs with albuterol as needed -Start Tamiflu -Continue supplemental O2 as needed and wean as able -Continue Symbicort and Spiriva

## 2022-05-15 NOTE — Assessment & Plan Note (Signed)
Mild troponin elevation likely demand ischemia in setting of acute hypoxic respiratory failure due to asthma exacerbation and influenza A.  Repeat troponin trending down.  EKG without acute ischemic changes.  Doubt ACS or viral myocarditis at this time.

## 2022-05-15 NOTE — Assessment & Plan Note (Signed)
Resume home meds once med reconciliation complete.

## 2022-05-15 NOTE — ED Triage Notes (Signed)
Pt BIB EMS from [facility] pt o2 in [facility] was 84% room air. Pt is on 4l [place].  20 lac 125mg  solumedrol given en route

## 2022-05-15 NOTE — ED Provider Notes (Signed)
[facility]-EMERGENCY DEPT Provider Note   CSN: [id number] Arrival date & time: 05/15/22  1832     History  Chief Complaint  Patient presents with   Shortness of Breath    SAYLAH KETNER is a 41 y.o. female.  Patient is a 41 year old female with a history of asthma, hypertension, GERD and anxiety who is presenting today with a complaint of shortness of breath.  Patient reports over the last day she has had headache, cough, congestion, wheezing and shortness of breath.  She reports her daughter has been sick with a URI but tested negative for COVID and flu.  She was trying to use her inhaler at home but it was not working.  [facility] and at that time they noted that she had an oxygen saturation of 84% on room air.  911 was called.  EMS administered Solu-Medrol, DuoNeb and placed the patient on 2 L of nasal cannula oxygen.  Patient reports that she has been feeling a little better after this but she still has pain in her ribs from all the coughing and a headache.  Also noted to have a temperature of 99.1.  She denies any nausea vomiting or abdominal pain.  The history is provided by the patient.  Shortness of Breath      Home Medications Prior to Admission medications   Medication Sig Start Date End Date Taking? Authorizing Provider  albuterol (PROAIR HFA) 108 (90 Base) MCG/ACT inhaler INHALE 1-2 PUFFS INTO THE LUNGS EVERY 6 (SIX) HOURS AS NEEDED FOR WHEEZING OR SHORTNESS OF BREATH. Patient taking differently: Inhale 2 puffs into the lungs every 6 (six) hours as needed for wheezing or shortness of breath. 12/14/20   Worthy Rancher B, FNP  atomoxetine (STRATTERA) 60 MG capsule Take 60 mg by mouth daily. 08/11/21   [provider]  buPROPion (WELLBUTRIN SR) 150 MG 12 hr tablet Take 1 tablet (150 mg total) by mouth in the morning and at bedtime. 01/30/22   Rankin, Shuvon B, NP  busPIRone (BUSPAR) 10 MG tablet Take 10 mg by mouth 3 (three)  times daily. 08/11/21   [provider]  diazepam (VALIUM) 10 MG tablet Take 5-10 mg by mouth every 8 (eight) hours as needed for anxiety. 08/20/21   [provider]  dicyclomine (BENTYL) 10 MG capsule Take 10 mg by mouth 3 (three) times daily before meals.    [provider]  folic acid (FOLVITE) 1 MG tablet Take 1 mg by mouth daily. 08/22/21   [provider]  losartan (COZAAR) 50 MG tablet Take 50 mg by mouth daily.    [provider]  meloxicam (MOBIC) 15 MG tablet Take 15 mg by mouth daily. 02/25/21   [provider]  montelukast (SINGULAIR) 10 MG tablet Take 10 mg by mouth at bedtime. 12/15/20   [provider]  nicotine (NICODERM CQ - DOSED IN MG/24 HOURS) 21 mg/24hr patch Place 21 mg onto the skin daily as needed (for smoking cessation).    [provider]  OLANZapine (ZYPREXA) 10 MG tablet Take 10 mg by mouth at bedtime. 08/13/21   [provider]  pantoprazole (PROTONIX) 40 MG tablet TAKE 1 TABLET BY MOUTH EVERY DAY Patient taking differently: Take 40 mg by mouth daily before breakfast. 12/14/20   Charlott Holler, MD  SYMBICORT 160-4.5 MCG/ACT inhaler Inhale 2 puffs into the lungs 2 (two) times daily.    [provider]  Tiotropium Bromide Monohydrate (SPIRIVA  RESPIMAT) 2.5 MCG/ACT AERS Inhale 2 puffs into the lungs daily. 03/07/22   Charlott Holler, MD  venlafaxine XR (EFFEXOR-XR) 37.5 MG 24 hr capsule Take 37.5 mg by mouth daily with breakfast.    [provider]      Allergies    Alprazolam, Penicillins, and Norvasc [amlodipine besylate]    Review of Systems   Review of Systems  Respiratory:  Positive for shortness of breath.     Physical Exam Updated Vital Signs BP 113/70   Pulse (!) 114   Temp 98.2 F (36.8 C) (Oral)   Resp 18   Ht 5\' 4"  (1.626 m)   Wt 84.3 kg   LMP 05/13/2022   SpO2 90%   BMI 31.90 kg/m  Physical Exam Vitals and nursing note reviewed.  Constitutional:       General: She is not in acute distress.    Appearance: She is well-developed.  HENT:     Head: Normocephalic and atraumatic.  Eyes:     Pupils: Pupils are equal, round, and reactive to light.  Cardiovascular:     Rate and Rhythm: Regular rhythm. Tachycardia present.     Heart sounds: Normal heart sounds. No murmur heard.    No friction rub.  Pulmonary:     Effort: Pulmonary effort is normal. Tachypnea present.     Breath sounds: Decreased breath sounds and wheezing present. No rales.  Abdominal:     General: Bowel sounds are normal. There is no distension.     Palpations: Abdomen is soft.     Tenderness: There is no abdominal tenderness. There is no guarding or rebound.  Musculoskeletal:        General: No tenderness. Normal range of motion.     Comments: No edema  Skin:    General: Skin is warm and dry.     Findings: No rash.  Neurological:     Mental Status: She is alert and oriented to person, place, and time.     Cranial Nerves: No cranial nerve deficit.  Psychiatric:        Behavior: Behavior normal.     ED Results / Procedures / Treatments   Labs (all labs ordered are listed, but only abnormal results are displayed) Labs Reviewed  RESP PANEL BY RT-PCR (RSV, FLU A&B, COVID)  RVPGX2 - Abnormal; Notable for the following components:      Result Value   Influenza A by PCR POSITIVE (*)    All other components within normal limits  CBC WITH DIFFERENTIAL/PLATELET - Abnormal; Notable for the following components:   HCT 46.1 (*)    MCV 103.1 (*)    MCHC 29.5 (*)    Neutro Abs 7.8 (*)    All other components within normal limits  BASIC METABOLIC PANEL - Abnormal; Notable for the following components:   Glucose, Bld 110 (*)    Calcium 8.8 (*)    All other components within normal limits  TROPONIN I (HIGH SENSITIVITY) - Abnormal; Notable for the following components:   Troponin I (High Sensitivity) 116 (*)    All other components within normal limits  MAGNESIUM  TROPONIN  I (HIGH SENSITIVITY)    EKG EKG Interpretation  Date/Time:  Wednesday May 15 2022 18:58:47 EST Ventricular Rate:  117 PR Interval:  140 QRS Duration: 75 QT Interval:  317 QTC Calculation: 443 R Axis:   58 Text Interpretation: Sinus tachycardia Biatrial enlargement Low voltage, precordial leads Confirmed by [id number] (Gwyneth Sprout) on 05/15/2022 8:16:49 PM  Radiology DG Chest 2 View  Result Date: 05/15/2022 CLINICAL DATA:  Dyspnea.  History of asthma. EXAM: CHEST - 2 VIEW COMPARISON:  08/29/2021 FINDINGS: Airway thickening is present, suggesting bronchitis or reactive airways disease. Cardiac and mediastinal margins appear normal. No airspace opacity or blunting of the costophrenic angles. No significant bony abnormality observed. IMPRESSION: 1. Airway thickening is present, suggesting bronchitis or reactive airways disease. Electronically Signed   By: Gaylyn Rong M.D.   On: 05/15/2022 19:22    Procedures Procedures    Medications Ordered in ED Medications  ipratropium-albuterol (DUONEB) 0.5-2.5 (3) MG/3ML nebulizer solution 3 mL (3 mLs Nebulization Given 05/15/22 2018)  magnesium sulfate IVPB 2 g 50 mL (2 g Intravenous New Bag/Given 05/15/22 2022)  acetaminophen (TYLENOL) tablet 1,000 mg (1,000 mg Oral Given 05/15/22 2017)  ketorolac (TORADOL) 15 MG/ML injection 15 mg (15 mg Intravenous Given 05/15/22 2019)    ED Course/ Medical Decision Making/ A&P                           Medical Decision Making Amount and/or Complexity of Data Reviewed External Data Reviewed: notes.    Details: [facility] Labs: ordered. Decision-making details documented in ED Course. Radiology: ordered and independent interpretation performed. Decision-making details documented in ED Course. ECG/medicine tests: ordered and independent interpretation performed. Decision-making details documented in ED Course.  Risk OTC drugs. Prescription drug management. Decision regarding  hospitalization.   Pt with multiple medical problems and comorbidities and presenting today with a complaint that caries a high risk for morbidity and mortality.  Here today for hypoxia and asthma exacerbation.  Patient was given DuoNeb, Solu-Medrol and placed on oxygen prior to arrival due to oxygen sats of 84% on room air.  Last night she noted to develop URI symptoms and her daughter is sick at home with similar symptoms.  Concern for viral illness causing asthma exacerbation.  Low suspicion for ACS, PE, pneumothorax or pneumonia.  Patient will be given additional magnesium, DuoNeb.  Imaging and labs are pending.  Will attempt to wean oxygen but if not patient will require admission for above.  9:32 PM I independently interpreted patient's labs today and EKG.  EKG with sinus tachycardia but no other acute changes.  Patient is positive for influenza A, CBC and BMP without acute findings.  Magnesium within normal limits and a troponin that was ordered from triage is positive at 116.  Feel that this is most likely related to patient's recent asthma exacerbation, hypoxia and tachycardia.  Feel most likely this is strain.  No evidence for ACS on EKG and story is not consistent with that.  I have independently visualized and interpreted pt's images today.  Chest x-ray without evidence of pneumonia.  On repeat evaluation patient's wheezing is mildly improved but she is satting 88 to 90% on 2 L of oxygen.  At this time feel that she will need admission for influenza, asthma exacerbation and respiratory failure with new acute hypoxia.  Findings discussed with the patient.  She is comfortable with this plan.  She did receive a flu shot this year.          Final Clinical Impression(s) / ED Diagnoses Final diagnoses:  Influenza A  Acute respiratory failure with hypoxia (HCC)  Moderate persistent asthma with exacerbation    Rx / DC Orders ED Discharge Orders     None         Gwyneth Sprout,  MD 05/15/22 2132

## 2022-05-15 NOTE — ED Provider Triage Note (Signed)
Emergency Medicine Provider Triage Evaluation Note  [name] , a 41 y.o. female  was evaluated in triage.  Pt complains of congestion, shortness of breath, wheezing, chest pain that started last night.  Daughter sick with URI.  History of COPD.  And route with EMS patient received Solu-Medrol, and DuoNeb.  Reports improvement in symptoms since then.  Currently on 2 L supplemental O2..  Review of Systems  Positive: As above Negative: As above  Physical Exam  BP (!) 123/96   Pulse (!) 119   Temp 99.1 F (37.3 C) (Oral)   Resp 20   Ht 5\' 4"  (1.626 m)   Wt 84.3 kg   SpO2 95%   BMI 31.90 kg/m  Gen:   Awake, no distress   Resp:  Normal effort  MSK:   Moves extremities without difficulty  Other:    Medical Decision Making  Medically screening exam initiated at 6:54 PM.  Appropriate orders placed.  was informed that the remainder of the evaluation will be completed by another provider, this initial triage assessment does not replace that evaluation, and the importance of remaining in the ED until their evaluation is complete.     [name], PA-C 05/15/22 [phone]

## 2022-05-15 NOTE — Hospital Course (Signed)
[name] is a 41 y.o. female with medical history significant for asthma, HTN, GERD, depression/anxiety who is admitted with acute hypoxic respiratory failure due to acute asthma exacerbation in setting of influenza A.

## 2022-05-15 NOTE — H&P (Signed)
History and Physical    [name] DOB: 04/01/1981 DOA: 05/15/2022  PCP: Roe Rutherford, NP  Patient coming from: Home  I have personally briefly reviewed patient's old medical records in [facility]  Chief Complaint: Shortness of breath  HPI: [name] is a 41 y.o. female with medical history significant for asthma, HTN, GERD, depression/anxiety who presented to the ED for evaluation of dyspnea.  Patient reports 1 day of shortness of breath with nonproductive cough, chest congestion, wheezing, headache and sinus congestion, body aches, chills.  She tried using her home albuterol inhaler but it was not working.  She went to [facility] earlier today and was noted to have an oxygen saturation of 84% on room air.  EMS were called and she was given Solu-Medrol, DuoNeb, and placed on 2 L O2 via [place] on route to the ED. She reports continued chest and abdominal wall discomfort due to frequent cough.  ED Course  Labs/Imaging on admission: I have personally reviewed following labs and imaging studies.  Initial vital showed BP 123/96, pulse 119, RR 20, temp 9 9.1 F, SpO2 95% on room air.  Labs show WBC 9.3, hemoglobin 13.6, platelets 228,000, sodium 139, potassium 3.7, bicarb 28, BUN 6, creatinine 0.9, serum glucose 110, magnesium 2.1.  Troponin 116 > 92.  Influenza A positive.  SARS-CoV-2 and RSV PCR negative.  2 view chest x-ray shows airway thickening suggesting bronchitis or reactive airway disease.  Patient was given IV magnesium, DuoNeb treatment, Toradol, and Tylenol.  The hospitalist service was consulted to admit for further evaluation and management.  Review of Systems: All systems reviewed and are negative except as documented in history of present illness above.   Past Medical History:  Diagnosis Date   Anxiety    Asthma    Depression    doing good, not on meds now   GERD (gastroesophageal reflux disease)    Headache    migraines    History of kidney stones    Hypertension    Kidney stones    Missed abortion 10/02/2013   Ovarian cyst    Pregnancy induced hypertension    Suicidal intent    Vaginal Pap smear, abnormal    bx, f/u ok    Past Surgical History:  Procedure Laterality Date   CESAREAN SECTION     CESAREAN SECTION N/A 06/06/2015   Procedure: CESAREAN SECTION;  Surgeon: Kathreen Cosier, MD;  Location: [facility];  Service: Obstetrics;  Laterality: N/A;   CHOLECYSTECTOMY N/A 08/23/2016   Procedure: LAPAROSCOPIC CHOLECYSTECTOMY;  Surgeon: Berna Bue, MD;  Location: [facility] OR;  Service: General;  Laterality: N/A;   DILATION AND CURETTAGE OF UTERUS     DILATION AND EVACUATION N/A 10/04/2013   Procedure: DILATATION AND EVACUATION;  Surgeon: Sherian Rein, MD;  Location: [facility];  Service: Gynecology;  Laterality: N/A;  [place] in room    TONSILLECTOMY     TUBAL LIGATION      Social History:  reports that she has been smoking cigarettes. She has a 3.75 pack-year smoking history. She has never used smokeless tobacco. She reports current alcohol use of about 4.0 standard drinks of alcohol per week. She reports that she does not use drugs.  Allergies  Allergen Reactions   Alprazolam Itching   Penicillins Nausea And Vomiting and Other (See Comments)    Has patient had a PCN reaction causing immediate rash, facial/tongue/throat swelling, SOB or lightheadedness with hypotension:No Has patient had a PCN reaction  causing severe rash involving mucus membranes or skin necrosis:No Has patient had a PCN reaction that REACTION THAT REQUIRED HOSPITALIZATION:  #  #  #  YES  #  #  #  Has patient had a PCN reaction occurring within the last 10 years: #  #  #  YES  #  #  #    Norvasc [Amlodipine Besylate] Other (See Comments)    Tip of tongue numb and flushing    Family History  Problem Relation Age of Onset   Hearing loss Mother    Asthma Mother    Asthma Father    Diabetes Son    Cancer Maternal Grandfather         bone   Liver disease Maternal Grandfather    Asthma Sister    Lupus Sister      Prior to Admission medications   Medication Sig Start Date End Date Taking? Authorizing Provider  albuterol (PROAIR HFA) 108 (90 Base) MCG/ACT inhaler INHALE 1-2 PUFFS INTO THE LUNGS EVERY 6 (SIX) HOURS AS NEEDED FOR WHEEZING OR SHORTNESS OF BREATH. Patient taking differently: Inhale 2 puffs into the lungs every 6 (six) hours as needed for wheezing or shortness of breath. 12/14/20   Worthy Rancher B, FNP  atomoxetine (STRATTERA) 60 MG capsule Take 60 mg by mouth daily. 08/11/21   [provider]  buPROPion (WELLBUTRIN SR) 150 MG 12 hr tablet Take 1 tablet (150 mg total) by mouth in the morning and at bedtime. 01/30/22   Rankin, Shuvon B, NP  busPIRone (BUSPAR) 10 MG tablet Take 10 mg by mouth 3 (three) times daily. 08/11/21   [provider]  diazepam (VALIUM) 10 MG tablet Take 5-10 mg by mouth every 8 (eight) hours as needed for anxiety. 08/20/21   [provider]  dicyclomine (BENTYL) 10 MG capsule Take 10 mg by mouth 3 (three) times daily before meals.    [provider]  folic acid (FOLVITE) 1 MG tablet Take 1 mg by mouth daily. 08/22/21   [provider]  losartan (COZAAR) 50 MG tablet Take 50 mg by mouth daily.    [provider]  meloxicam (MOBIC) 15 MG tablet Take 15 mg by mouth daily. 02/25/21   [provider]  montelukast (SINGULAIR) 10 MG tablet Take 10 mg by mouth at bedtime. 12/15/20   [provider]  nicotine (NICODERM CQ - DOSED IN MG/24 HOURS) 21 mg/24hr patch Place 21 mg onto the skin daily as needed (for smoking cessation).    [provider]  OLANZapine (ZYPREXA) 10 MG tablet Take 10 mg by mouth at bedtime. 08/13/21   [provider]  pantoprazole (PROTONIX) 40 MG tablet TAKE 1 TABLET BY MOUTH EVERY DAY Patient taking differently: Take 40 mg by mouth daily before breakfast. 12/14/20   Charlott Holler, MD  SYMBICORT  160-4.5 MCG/ACT inhaler Inhale 2 puffs into the lungs 2 (two) times daily.    [provider]  Tiotropium Bromide Monohydrate (SPIRIVA RESPIMAT) 2.5 MCG/ACT AERS Inhale 2 puffs into the lungs daily. 03/07/22   Charlott Holler, MD  venlafaxine XR (EFFEXOR-XR) 37.5 MG 24 hr capsule Take 37.5 mg by mouth daily with breakfast.    [provider]    Physical Exam: Vitals:   05/15/22 1837 05/15/22 1845 05/15/22 2024 05/15/22 2100  BP: (!) 123/96  (!) 125/92 113/70  Pulse: (!) 119  (!) 114 (!) 114  Resp: 20  18 18   Temp: 99.1 F (37.3 C)  98.2 F (36.8 C)   TempSrc: Oral  Oral   SpO2: 95%  92% 90%  Weight:  84.3 kg    Height:  5\' 4"  (1.626 m)     Constitutional: Resting in bed, NAD, calm, comfortable Eyes: EOMI, lids and conjunctivae normal ENMT: Mucous membranes are moist. Posterior pharynx clear of any exudate or lesions.Normal dentition.  Neck: normal, supple, no masses. Respiratory: End expiratory wheezing bilaterally. Normal respiratory effort while on 2 L O2 via [place]. No accessory muscle use.  Cardiovascular: Regular rate and rhythm, no murmurs / rubs / gallops. No extremity edema. 2+ pedal pulses. Abdomen: no tenderness, no masses palpated. Musculoskeletal: no clubbing / cyanosis. No joint deformity upper and lower extremities. Good ROM, no contractures. Normal muscle tone.  Skin: no rashes, lesions, ulcers. No induration Neurologic: Sensation intact. Strength 5/5 in all 4.  Psychiatric: Alert and oriented x 3.  EKG: Personally reviewed. Sinus tachycardia, rate 117, no acute ischemic changes.  Rate is faster when compared to prior.  Assessment/Plan Principal Problem:   Acute respiratory failure with hypoxia (HCC) Active Problems:   Asthma, chronic, unspecified asthma severity, with acute exacerbation   Influenza A   Hypertension   Elevated troponin   Depression with anxiety   [name] is a 41 y.o. female with medical history significant for asthma,  HTN, GERD, depression/anxiety who is admitted with acute hypoxic respiratory failure due to acute asthma exacerbation in setting of influenza A.  Assessment and Plan: * Acute respiratory failure with hypoxia (HCC) Acute asthma exacerbation Influenza A Presenting with acute asthma exacerbation triggered by influenza A with likely superimposed bronchitis.  SpO2 was 84% on room air at outside facility, currently stable on 2 L O2 via [place]. -Start IV Solu-Medrol 40 mg twice daily -Scheduled DuoNebs with albuterol as needed -Start Tamiflu -Continue supplemental O2 as needed and wean as able -Continue Symbicort and Spiriva  Influenza A Start Tamiflu, IV fluid hydration overnight.  Elevated troponin Mild troponin elevation likely demand ischemia in setting of acute hypoxic respiratory failure due to asthma exacerbation and influenza A.  Repeat troponin trending down.  EKG without acute ischemic changes.  Doubt ACS or viral myocarditis at this time.  Hypertension Holding antihypertensives for now.  Depression with anxiety Resume home meds once med reconciliation complete.  DVT prophylaxis: enoxaparin (LOVENOX) injection 40 mg Start: 05/16/22 1000 Code Status: Full code, confirmed with patient on admission Family Communication: Discussed with patient, she has discussed with family Disposition Plan: From home and likely discharge to home pending clinical progress Consults called: None Severity of Illness: The appropriate patient status for this patient is OBSERVATION. Observation status is judged to be reasonable and necessary in order to provide the required intensity of service to ensure the patient's safety. The patient's presenting symptoms, physical exam findings, and initial radiographic and laboratory data in the context of their medical condition is felt to place them at decreased risk for further clinical deterioration. Furthermore, it is anticipated that the patient will be medically  stable for discharge from the hospital within 2 midnights of admission.   05/18/22 MD [facility]  If 7PM-7AM, please contact night-coverage [email or web address]  05/15/2022, 10:41 PM

## 2022-05-16 ENCOUNTER — Encounter (HOSPITAL_COMMUNITY): Payer: Self-pay | Admitting: Internal Medicine

## 2022-05-16 DIAGNOSIS — Z87442 Personal history of urinary calculi: Secondary | ICD-10-CM | POA: Diagnosis not present

## 2022-05-16 DIAGNOSIS — F419 Anxiety disorder, unspecified: Secondary | ICD-10-CM | POA: Diagnosis present

## 2022-05-16 DIAGNOSIS — Z880 Allergy status to penicillin: Secondary | ICD-10-CM | POA: Diagnosis not present

## 2022-05-16 DIAGNOSIS — F32A Depression, unspecified: Secondary | ICD-10-CM | POA: Diagnosis present

## 2022-05-16 DIAGNOSIS — Z822 Family history of deafness and hearing loss: Secondary | ICD-10-CM | POA: Diagnosis not present

## 2022-05-16 DIAGNOSIS — J101 Influenza due to other identified influenza virus with other respiratory manifestations: Secondary | ICD-10-CM | POA: Diagnosis present

## 2022-05-16 DIAGNOSIS — Z1152 Encounter for screening for COVID-19: Secondary | ICD-10-CM | POA: Diagnosis not present

## 2022-05-16 DIAGNOSIS — J1001 Influenza due to other identified influenza virus with the same other identified influenza virus pneumonia: Secondary | ICD-10-CM | POA: Diagnosis present

## 2022-05-16 DIAGNOSIS — J4541 Moderate persistent asthma with (acute) exacerbation: Secondary | ICD-10-CM | POA: Diagnosis present

## 2022-05-16 DIAGNOSIS — Z808 Family history of malignant neoplasm of other organs or systems: Secondary | ICD-10-CM | POA: Diagnosis not present

## 2022-05-16 DIAGNOSIS — F17210 Nicotine dependence, cigarettes, uncomplicated: Secondary | ICD-10-CM | POA: Diagnosis present

## 2022-05-16 DIAGNOSIS — K219 Gastro-esophageal reflux disease without esophagitis: Secondary | ICD-10-CM | POA: Diagnosis present

## 2022-05-16 DIAGNOSIS — Z833 Family history of diabetes mellitus: Secondary | ICD-10-CM | POA: Diagnosis not present

## 2022-05-16 DIAGNOSIS — I2489 Other forms of acute ischemic heart disease: Secondary | ICD-10-CM | POA: Diagnosis present

## 2022-05-16 DIAGNOSIS — Z825 Family history of asthma and other chronic lower respiratory diseases: Secondary | ICD-10-CM | POA: Diagnosis not present

## 2022-05-16 DIAGNOSIS — Z885 Allergy status to narcotic agent status: Secondary | ICD-10-CM | POA: Diagnosis not present

## 2022-05-16 DIAGNOSIS — Z888 Allergy status to other drugs, medicaments and biological substances status: Secondary | ICD-10-CM | POA: Diagnosis not present

## 2022-05-16 DIAGNOSIS — J9601 Acute respiratory failure with hypoxia: Secondary | ICD-10-CM | POA: Diagnosis not present

## 2022-05-16 DIAGNOSIS — J40 Bronchitis, not specified as acute or chronic: Secondary | ICD-10-CM | POA: Diagnosis present

## 2022-05-16 DIAGNOSIS — Z832 Family history of diseases of the blood and blood-forming organs and certain disorders involving the immune mechanism: Secondary | ICD-10-CM | POA: Diagnosis not present

## 2022-05-16 DIAGNOSIS — G43909 Migraine, unspecified, not intractable, without status migrainosus: Secondary | ICD-10-CM | POA: Diagnosis present

## 2022-05-16 DIAGNOSIS — Z79899 Other long term (current) drug therapy: Secondary | ICD-10-CM | POA: Diagnosis not present

## 2022-05-16 DIAGNOSIS — I10 Essential (primary) hypertension: Secondary | ICD-10-CM | POA: Diagnosis present

## 2022-05-16 DIAGNOSIS — Z7951 Long term (current) use of inhaled steroids: Secondary | ICD-10-CM | POA: Diagnosis not present

## 2022-05-16 LAB — CBC
HCT: 42.6 % (ref 36.0–46.0)
Hemoglobin: 12.9 g/dL (ref 12.0–15.0)
MCH: 31.1 pg (ref 26.0–34.0)
MCHC: 30.3 g/dL (ref 30.0–36.0)
MCV: 102.7 fL — ABNORMAL HIGH (ref 80.0–100.0)
Platelets: 210 10*3/uL (ref 150–400)
RBC: 4.15 MIL/uL (ref 3.87–5.11)
RDW: 14.1 % (ref 11.5–15.5)
WBC: 7.5 10*3/uL (ref 4.0–10.5)
nRBC: 0 % (ref 0.0–0.2)

## 2022-05-16 LAB — BASIC METABOLIC PANEL
Anion gap: 7 (ref 5–15)
BUN: 7 mg/dL (ref 6–20)
CO2: 27 mmol/L (ref 22–32)
Calcium: 8.8 mg/dL — ABNORMAL LOW (ref 8.9–10.3)
Chloride: 108 mmol/L (ref 98–111)
Creatinine, Ser: 0.89 mg/dL (ref 0.44–1.00)
GFR, Estimated: >60 mL/min (ref >60)
Glucose, Bld: 150 mg/dL — ABNORMAL HIGH (ref 70–99)
Potassium: 5 mmol/L (ref 3.5–5.1)
Sodium: 142 mmol/L (ref 135–145)

## 2022-05-16 MED ORDER — PANTOPRAZOLE SODIUM 40 MG PO TBEC
40.0000 mg | DELAYED_RELEASE_TABLET | Freq: Every day | ORAL | Status: DC
  Administered 2022-05-17 – 2022-05-18 (×2): 40 mg via ORAL
  Filled 2022-05-16 (×2): qty 1

## 2022-05-16 MED ORDER — ALBUTEROL SULFATE (2.5 MG/3ML) 0.083% IN NEBU: 2.5000 mg | INHALATION_SOLUTION | Freq: Three times a day (TID) | RESPIRATORY_TRACT | Status: DC

## 2022-05-16 MED ORDER — NICOTINE 7 MG/24HR TD PT24: 21.0000 mg | MEDICATED_PATCH | Freq: Every day | TRANSDERMAL | Status: DC | PRN

## 2022-05-16 MED ORDER — ALBUTEROL SULFATE HFA 108 (90 BASE) MCG/ACT IN AERS
2.0000 | INHALATION_SPRAY | RESPIRATORY_TRACT | Status: DC
  Administered 2022-05-16 (×3): 2 via RESPIRATORY_TRACT
  Filled 2022-05-16 (×2): qty 6.7

## 2022-05-16 MED ORDER — SODIUM POLYSTYRENE SULFONATE 15 GM/60ML PO SUSP
15.0000 g | Freq: Once | ORAL | Status: AC
  Administered 2022-05-16: 15 g via ORAL
  Filled 2022-05-16: qty 60

## 2022-05-16 MED ORDER — OLANZAPINE 5 MG PO TABS
10.0000 mg | ORAL_TABLET | Freq: Every day | ORAL | Status: DC
  Administered 2022-05-16 – 2022-05-17 (×2): 10 mg via ORAL
  Filled 2022-05-16 (×2): qty 2

## 2022-05-16 MED ORDER — DIAZEPAM 5 MG PO TABS
5.0000 mg | ORAL_TABLET | Freq: Three times a day (TID) | ORAL | Status: DC | PRN
  Administered 2022-05-16 – 2022-05-17 (×3): 10 mg via ORAL
  Filled 2022-05-16 (×3): qty 2

## 2022-05-16 MED ORDER — VENLAFAXINE HCL ER 37.5 MG PO CP24
37.5000 mg | ORAL_CAPSULE | Freq: Every day | ORAL | Status: DC
  Administered 2022-05-17 – 2022-05-18 (×2): 37.5 mg via ORAL
  Filled 2022-05-16 (×2): qty 1

## 2022-05-16 MED ORDER — BUSPIRONE HCL 5 MG PO TABS
10.0000 mg | ORAL_TABLET | Freq: Three times a day (TID) | ORAL | Status: DC
  Administered 2022-05-16 – 2022-05-18 (×6): 10 mg via ORAL
  Filled 2022-05-16 (×3): qty 2
  Filled 2022-05-16: qty 1
  Filled 2022-05-16 (×2): qty 2

## 2022-05-16 MED ORDER — ATOMOXETINE HCL 60 MG PO CAPS
60.0000 mg | ORAL_CAPSULE | Freq: Every day | ORAL | Status: DC
  Filled 2022-05-16: qty 1

## 2022-05-16 MED ORDER — TRAMADOL HCL 50 MG PO TABS
50.0000 mg | ORAL_TABLET | Freq: Four times a day (QID) | ORAL | Status: DC | PRN
  Administered 2022-05-16 – 2022-05-18 (×3): 50 mg via ORAL
  Filled 2022-05-16 (×3): qty 1

## 2022-05-16 MED ORDER — ALBUTEROL SULFATE (2.5 MG/3ML) 0.083% IN NEBU
2.5000 mg | INHALATION_SOLUTION | RESPIRATORY_TRACT | Status: DC | PRN
  Administered 2022-05-16: 2.5 mg via RESPIRATORY_TRACT
  Filled 2022-05-16 (×2): qty 3

## 2022-05-16 MED ORDER — DICYCLOMINE HCL 10 MG PO CAPS
10.0000 mg | ORAL_CAPSULE | Freq: Three times a day (TID) | ORAL | Status: DC
  Administered 2022-05-16 – 2022-05-18 (×7): 10 mg via ORAL
  Filled 2022-05-16 (×8): qty 1

## 2022-05-16 NOTE — Progress Notes (Signed)
[facility] PROGRESS NOTE    Progress Note  [name]  [id number] DOB: [birth year] DOA: 05/15/2022 PCP: Pablo Lawrence, NP     Brief Narrative:   [name] is an 41 y.o. female past medical history significant for asthma, GERD depression anxiety comes in for shortness of breath the ED was noted to have saturations of 84% started on Solu-Medrol DuoNebs and placed on 2 L of oxygen   Assessment/Plan:   Acute respiratory failure with hypoxia due to asthma, chronic, unspecified asthma severity, with acute exacerbation in the setting of influenza A: Oxygen supplementation, her saturations have been greater than 88% on 2 L of oxygen. Continue IV fluids, steroids and inhalers. Continue Tamiflu for total of 5 days. Poor air movement on physical exam tachypneic wheezing bilaterally  Elevated troponins: Likely demand ischemia in the setting of hypoxia.  Essential hypertension: Blood pressures relatively stable continue to hold antihypertensive medication.  Depression with anxiety Resume home meds.   DVT prophylaxis: lovenox Family Communication:none Status is: Observation The patient will require care spanning > 2 midnights and should be moved to inpatient because: Acute respiratory failure with hypoxia due to influenza A pneumonia    Code Status:     Code Status Orders  (From admission, onward)           Start     Ordered   05/15/22 2230  Full code  Continuous       Question:  By:  Answer:  Consent: discussion documented in EHR   05/15/22 2231           Code Status History     Date Active Date Inactive Code Status Order ID Comments User Context   01/30/2022 0109 01/30/2022 1813 Full Code [id number]  Randon Goldsmith, NP ED   08/30/2021 0038 08/31/2021 1817 Full Code [id number]  Lenore Cordia, MD Inpatient   07/20/2017 2158 07/23/2017 2046 Full Code [id number]  Ethelene Hal, NP Inpatient   07/19/2017 1000 07/20/2017 2152 Full Code  [id number]  Sherwood Gambler, MD ED   06/13/2015 1759 06/15/2015 1137 Full Code [id number]  Chyrel Masson, RN Inpatient   06/06/2015 2118 06/09/2015 1405 Full Code [id number]  Frederico Hamman, MD Inpatient   06/06/2015 1349 06/06/2015 2118 Full Code [id number]  Darla Lesches, RN Inpatient   06/06/2015 1341 06/06/2015 1349 Full Code [id number]  Tamela Oddi Inpatient         IV Access:   Peripheral IV   Procedures and diagnostic studies:   DG Chest 2 View  Result Date: 05/15/2022 CLINICAL DATA:  Dyspnea.  History of asthma. EXAM: CHEST - 2 VIEW COMPARISON:  08/29/2021 FINDINGS: Airway thickening is present, suggesting bronchitis or reactive airways disease. Cardiac and mediastinal margins appear normal. No airspace opacity or blunting of the costophrenic angles. No significant bony abnormality observed. IMPRESSION: 1. Airway thickening is present, suggesting bronchitis or reactive airways disease. Electronically Signed   By: Van Clines M.D.   On: 05/15/2022 19:22     Medical Consultants:   None.   Subjective:    [name] she relates her breathing is not improved.  Objective:    Vitals:   05/15/22 2100 05/16/22 0048 05/16/22 0130 05/16/22 0430  BP: 113/70  127/88 109/83  Pulse: (!) 114  (!) 102 98  Resp: 18  20 20   Temp:  98 F (36.7 C)  98.2 F (36.8 C)  TempSrc:  Oral  Oral  SpO2: 90%  98% 90%  Weight:      Height:       SpO2: 90 %  No intake or output data in the 24 hours ending 05/16/22 [id number] Filed Weights   05/15/22 1845  Weight: 84.3 kg    Exam: General exam: In no acute distress. Respiratory system: Poor air movement and wheezing bilaterally she is actively breathing quite fast Cardiovascular system: S1 & S2 heard, RRR. No JVD. Gastrointestinal system: Abdomen is nondistended, soft and nontender.  Extremities: No pedal edema. Skin: No rashes, lesions or ulcers Psychiatry: Judgement and insight appear normal. Mood & affect  appropriate.    Data Reviewed:    Labs: Basic Metabolic Panel: Recent Labs  Lab 05/15/22 1800  NA 139  K 3.7  CL 106  CO2 28  GLUCOSE 110*  BUN 6  CREATININE 0.89  CALCIUM 8.8*  MG 2.1   GFR Estimated Creatinine Clearance: 89.1 mL/min (by C-G formula based on SCr of 0.89 mg/dL). Liver Function Tests: No results for input(s): "AST", "ALT", "ALKPHOS", "BILITOT", "PROT", "ALBUMIN" in the last 168 hours. No results for input(s): "LIPASE", "AMYLASE" in the last 168 hours. No results for input(s): "AMMONIA" in the last 168 hours. Coagulation profile No results for input(s): "INR", "PROTIME" in the last 168 hours. COVID-19 Labs  No results for input(s): "DDIMER", "FERRITIN", "LDH", "CRP" in the last 72 hours.  Lab Results  Component Value Date   SARSCOV2NAA NEGATIVE 05/15/2022   [employer] NEGATIVE 01/29/2022   [place] NEGATIVE 08/29/2021   SARSCOV2NAA NOT DETECTED 03/15/2019    CBC: Recent Labs  Lab 05/15/22 1800  WBC 9.3  NEUTROABS 7.8*  HGB 13.6  HCT 46.1*  MCV 103.1*  PLT 228   Cardiac Enzymes: No results for input(s): "CKTOTAL", "CKMB", "CKMBINDEX", "TROPONINI" in the last 168 hours. BNP (last 3 results) No results for input(s): "PROBNP" in the last 8760 hours. CBG: No results for input(s): "GLUCAP" in the last 168 hours. D-Dimer: No results for input(s): "DDIMER" in the last 72 hours. Hgb A1c: No results for input(s): "HGBA1C" in the last 72 hours. Lipid Profile: No results for input(s): "CHOL", "HDL", "LDLCALC", "TRIG", "CHOLHDL", "LDLDIRECT" in the last 72 hours. Thyroid function studies: No results for input(s): "TSH", "T4TOTAL", "T3FREE", "THYROIDAB" in the last 72 hours.  Invalid input(s): "FREET3" Anemia work up: No results for input(s): "VITAMINB12", "FOLATE", "FERRITIN", "TIBC", "IRON", "RETICCTPCT" in the last 72 hours. Sepsis Labs: Recent Labs  Lab 05/15/22 1800  WBC 9.3   Microbiology Recent Results (from the past 240  hour(s))  Resp panel by RT-PCR (RSV, Flu A&B, Covid) Anterior Nasal Swab     Status: Abnormal   Collection Time: 05/15/22  6:55 PM   Specimen: Anterior Nasal Swab  Result Value Ref Range Status   SARS Coronavirus 2 by RT PCR NEGATIVE NEGATIVE Final    Comment: (NOTE) SARS-CoV-2 target nucleic acids are NOT DETECTED.  The SARS-CoV-2 RNA is generally detectable in upper respiratory specimens during the acute phase of infection. The lowest concentration of SARS-CoV-2 viral copies this assay can detect is 138 copies/mL. A negative result does not preclude SARS-Cov-2 infection and should not be used as the sole basis for treatment or other patient management decisions. A negative result may occur with  improper specimen collection/handling, submission of specimen other than nasopharyngeal swab, presence of viral mutation(s) within the areas targeted by this assay, and inadequate number of viral copies(<138 copies/mL). A negative result must be combined with clinical observations, patient history, and epidemiological information. The expected result is Negative.  Fact Sheet for Patients:  BloggerCourse.com  Fact Sheet for Healthcare Providers:  SeriousBroker.it  This test is no t yet approved or cleared by the [place] FDA and  has been authorized for detection and/or diagnosis of SARS-CoV-2 by FDA under an Emergency Use Authorization (EUA). This EUA will remain  in effect (meaning this test can be used) for the duration of the COVID-19 declaration under Section 564(b)(1) of the Act, 21 U.S.C.section 360bbb-3(b)(1), unless the authorization is terminated  or revoked sooner.       Influenza A by PCR POSITIVE (A) NEGATIVE Final   Influenza B by PCR NEGATIVE NEGATIVE Final    Comment: (NOTE) The Xpert Xpress SARS-CoV-2/FLU/RSV plus assay is intended as an aid in the diagnosis of influenza from Nasopharyngeal swab specimens  and should not be used as a sole basis for treatment. Nasal washings and aspirates are unacceptable for Xpert Xpress SARS-CoV-2/FLU/RSV testing.  Fact Sheet for Patients: BloggerCourse.com  Fact Sheet for Healthcare Providers: SeriousBroker.it  This test is not yet approved or cleared by the [place] FDA and has been authorized for detection and/or diagnosis of SARS-CoV-2 by FDA under an Emergency Use Authorization (EUA). This EUA will remain in effect (meaning this test can be used) for the duration of the COVID-19 declaration under Section 564(b)(1) of the Act, 21 U.S.C. section 360bbb-3(b)(1), unless the authorization is terminated or revoked.     Resp Syncytial Virus by PCR NEGATIVE NEGATIVE Final    Comment: (NOTE) Fact Sheet for Patients: BloggerCourse.com  Fact Sheet for Healthcare Providers: SeriousBroker.it  This test is not yet approved or cleared by the [place] FDA and has been authorized for detection and/or diagnosis of SARS-CoV-2 by FDA under an Emergency Use Authorization (EUA). This EUA will remain in effect (meaning this test can be used) for the duration of the COVID-19 declaration under Section 564(b)(1) of the Act, 21 U.S.C. section 360bbb-3(b)(1), unless the authorization is terminated or revoked.  Performed at [facility], [address], [place], [place] [address]      Medications:    albuterol  2.5 mg Nebulization TID   enoxaparin (LOVENOX) injection  40 mg Subcutaneous Q24H   guaiFENesin  600 mg Oral BID   methylPREDNISolone (SOLU-MEDROL) injection  40 mg Intravenous Q12H   mometasone-formoterol  2 puff Inhalation BID   montelukast  10 mg Oral QHS   oseltamivir  75 mg Oral BID   sodium chloride flush  3 mL Intravenous Q12H   umeclidinium bromide  1 puff Inhalation Daily   Continuous Infusions:  sodium chloride  125 mL/hr at 05/15/22 2256      LOS: 0 days   Marinda Elk  [facility]  05/16/2022, 6:52 AM

## 2022-05-16 NOTE — ED Notes (Signed)
Limited assistance while walking patient to BR.

## 2022-05-16 NOTE — Progress Notes (Signed)
Chaplain provided prayer over [name] by phone.  Chaplain will continue to follow-up.     05/16/22 1600  Clinical Encounter Type  Visited With Patient  Visit Type Initial;Spiritual support  Spiritual Encounters  Spiritual Needs Prayer

## 2022-05-17 DIAGNOSIS — J9601 Acute respiratory failure with hypoxia: Secondary | ICD-10-CM | POA: Diagnosis not present

## 2022-05-17 MED ORDER — DM-GUAIFENESIN ER 30-600 MG PO TB12
1.0000 | ORAL_TABLET | Freq: Two times a day (BID) | ORAL | Status: DC
  Administered 2022-05-17 – 2022-05-18 (×2): 1 via ORAL
  Filled 2022-05-17 (×2): qty 1

## 2022-05-17 MED ORDER — LOSARTAN POTASSIUM 50 MG PO TABS
50.0000 mg | ORAL_TABLET | Freq: Every day | ORAL | Status: DC
  Administered 2022-05-17 – 2022-05-18 (×2): 50 mg via ORAL
  Filled 2022-05-17 (×2): qty 1

## 2022-05-17 MED ORDER — SODIUM POLYSTYRENE SULFONATE 15 GM/60ML PO SUSP
15.0000 g | Freq: Once | ORAL | Status: AC
  Administered 2022-05-17: 15 g via ORAL
  Filled 2022-05-17: qty 60

## 2022-05-17 NOTE — Progress Notes (Signed)
  Transition of Care [facility]) Screening Note   Patient Details  Name: [name] Date of Birth: [birth year]   [facility]) CM/SW Contact:    Otelia Santee, LCSW Phone Number: 05/17/2022, 12:29 PM    Transition of Care Department [facility]) has reviewed patient and no TOC needs have been identified at this time. We will continue to monitor patient advancement through interdisciplinary progression rounds. If new patient transition needs arise, please place a TOC consult.

## 2022-05-17 NOTE — Progress Notes (Signed)
[facility] PROGRESS NOTE    Progress Note  [name]  NOB:[id number] DOB: [birth year] DOA: 05/15/2022 PCP: Roe Rutherford, NP     Brief Narrative:   [name] is an 41 y.o. female past medical history significant for asthma, GERD depression anxiety comes in for shortness of breath the ED was noted to have saturations of 84% started on Solu-Medrol DuoNebs and placed on 2 L of oxygen   Assessment/Plan:   Acute respiratory failure with hypoxia due to asthma, chronic, unspecified asthma severity, with acute exacerbation in the setting of influenza A: Still requiring 2 L of oxygen to keep saturations greater than 92%. Continue steroids and inhalers. KVO IV fluids. Continue Tamiflu for total of 5 days. Improved air movement wheezing bilaterally.   She relates her breathing is improved.  Elevated troponins: Likely demand ischemia in the setting of hypoxia.  Essential hypertension: Pressure slowly trending up, restart losartan.  Depression with anxiety Resume home meds.   DVT prophylaxis: lovenox Family Communication:none Status is: Observation The patient will require care spanning > 2 midnights and should be moved to inpatient because: Acute respiratory failure with hypoxia due to influenza A pneumonia    Code Status:     Code Status Orders  (From admission, onward)           Start     Ordered   05/15/22 2230  Full code  Continuous       Question:  By:  Answer:  Consent: discussion documented in EHR   05/15/22 2231           Code Status History     Date Active Date Inactive Code Status Order ID Comments User Context   01/30/2022 0109 01/30/2022 1813 Full Code [id number]  Mancel Bale, NP ED   08/30/2021 0038 08/31/2021 1817 Full Code [id number]  Charlsie Quest, MD Inpatient   07/20/2017 2158 07/23/2017 2046 Full Code [id number]  Laveda Abbe, NP Inpatient   07/19/2017 1000 07/20/2017 2152 Full Code [id number]  Pricilla Loveless,  MD ED   06/13/2015 1759 06/15/2015 1137 Full Code [id number]  Evans Lance, RN Inpatient   06/06/2015 2118 06/09/2015 1405 Full Code [id number]  Kathreen Cosier, MD Inpatient   06/06/2015 1349 06/06/2015 2118 Full Code [id number]  Ellin Goodie, RN Inpatient   06/06/2015 1341 06/06/2015 1349 Full Code [id number]  Maretta Bees Inpatient         IV Access:   Peripheral IV   Procedures and diagnostic studies:   DG Chest 2 View  Result Date: 05/15/2022 CLINICAL DATA:  Dyspnea.  History of asthma. EXAM: CHEST - 2 VIEW COMPARISON:  08/29/2021 FINDINGS: Airway thickening is present, suggesting bronchitis or reactive airways disease. Cardiac and mediastinal margins appear normal. No airspace opacity or blunting of the costophrenic angles. No significant bony abnormality observed. IMPRESSION: 1. Airway thickening is present, suggesting bronchitis or reactive airways disease. Electronically Signed   By: Gaylyn Rong M.D.   On: 05/15/2022 19:22     Medical Consultants:   None.   Subjective:    [name] relates her breathing is not improved.  Objective:    Vitals:   05/16/22 1235 05/16/22 1642 05/16/22 2134 05/17/22 0436  BP: (!) 114/100 (!) 152/96 (!) 134/94 119/79  Pulse: (!) 109 (!) 109 (!) 106 78  Resp: 18 20 19 16   Temp:  98.5 F (36.9 C) 97.7 F (36.5 C) 97.6 F (36.4 C)  TempSrc:  Oral Oral Oral  SpO2: 96% 95% 97% 94%  Weight:      Height:       SpO2: 94 % O2 Flow Rate (L/min): 2 L/min   Intake/Output Summary (Last 24 hours) at 05/17/2022 0911 Last data filed at 05/16/2022 2102 Gross per 24 hour  Intake 480 ml  Output --  Net 480 ml   Filed Weights   05/15/22 1845  Weight: 84.3 kg    Exam: General exam: In no acute distress. Respiratory system: Good air movement and crackles and wheezing bilaterally Cardiovascular system: S1 & S2 heard, RRR. No JVD. Gastrointestinal system: Abdomen is nondistended, soft and nontender.  Extremities:  No pedal edema. Skin: No rashes, lesions or ulcers Psychiatry: Judgement and insight appear normal. Mood & affect appropriate.   Data Reviewed:    Labs: Basic Metabolic Panel: Recent Labs  Lab 05/15/22 1800 05/16/22 0651  NA 139 142  K 3.7 5.0  CL 106 108  CO2 28 27  GLUCOSE 110* 150*  BUN 6 7  CREATININE 0.89 0.89  CALCIUM 8.8* 8.8*  MG 2.1  --     GFR Estimated Creatinine Clearance: 89.1 mL/min (by C-G formula based on SCr of 0.89 mg/dL). Liver Function Tests: No results for input(s): "AST", "ALT", "ALKPHOS", "BILITOT", "PROT", "ALBUMIN" in the last 168 hours. No results for input(s): "LIPASE", "AMYLASE" in the last 168 hours. No results for input(s): "AMMONIA" in the last 168 hours. Coagulation profile No results for input(s): "INR", "PROTIME" in the last 168 hours. COVID-19 Labs  No results for input(s): "DDIMER", "FERRITIN", "LDH", "CRP" in the last 72 hours.  Lab Results  Component Value Date   SARSCOV2NAA NEGATIVE 05/15/2022   [place] NEGATIVE 01/29/2022   [place] NEGATIVE 08/29/2021   SARSCOV2NAA NOT DETECTED 03/15/2019    CBC: Recent Labs  Lab 05/15/22 1800 05/16/22 0651  WBC 9.3 7.5  NEUTROABS 7.8*  --   HGB 13.6 12.9  HCT 46.1* 42.6  MCV 103.1* 102.7*  PLT 228 210    Cardiac Enzymes: No results for input(s): "CKTOTAL", "CKMB", "CKMBINDEX", "TROPONINI" in the last 168 hours. BNP (last 3 results) No results for input(s): "PROBNP" in the last 8760 hours. CBG: No results for input(s): "GLUCAP" in the last 168 hours. D-Dimer: No results for input(s): "DDIMER" in the last 72 hours. Hgb A1c: No results for input(s): "HGBA1C" in the last 72 hours. Lipid Profile: No results for input(s): "CHOL", "HDL", "LDLCALC", "TRIG", "CHOLHDL", "LDLDIRECT" in the last 72 hours. Thyroid function studies: No results for input(s): "TSH", "T4TOTAL", "T3FREE", "THYROIDAB" in the last 72 hours.  Invalid input(s): "FREET3" Anemia work up: No results  for input(s): "VITAMINB12", "FOLATE", "FERRITIN", "TIBC", "IRON", "RETICCTPCT" in the last 72 hours. Sepsis Labs: Recent Labs  Lab 05/15/22 1800 05/16/22 0651  WBC 9.3 7.5    Microbiology Recent Results (from the past 240 hour(s))  Resp panel by RT-PCR (RSV, Flu A&B, Covid) Anterior Nasal Swab     Status: Abnormal   Collection Time: 05/15/22  6:55 PM   Specimen: Anterior Nasal Swab  Result Value Ref Range Status   SARS Coronavirus 2 by RT PCR NEGATIVE NEGATIVE Final    Comment: (NOTE) SARS-CoV-2 target nucleic acids are NOT DETECTED.  The SARS-CoV-2 RNA is generally detectable in upper respiratory specimens during the acute phase of infection. The lowest concentration of SARS-CoV-2 viral copies this assay can detect is 138 copies/mL. A negative result does not preclude SARS-Cov-2 infection and should not be used as the sole basis for treatment or other patient  management decisions. A negative result may occur with  improper specimen collection/handling, submission of specimen other than nasopharyngeal swab, presence of viral mutation(s) within the areas targeted by this assay, and inadequate number of viral copies(<138 copies/mL). A negative result must be combined with clinical observations, patient history, and epidemiological information. The expected result is Negative.  Fact Sheet for Patients:  EntrepreneurPulse.com.au  Fact Sheet for Healthcare Providers:  IncredibleEmployment.be  This test is no t yet approved or cleared by the [place] FDA and  has been authorized for detection and/or diagnosis of SARS-CoV-2 by FDA under an Emergency Use Authorization (EUA). This EUA will remain  in effect (meaning this test can be used) for the duration of the COVID-19 declaration under Section 564(b)(1) of the Act, 21 U.S.C.section 360bbb-3(b)(1), unless the authorization is terminated  or revoked sooner.       Influenza A by PCR  POSITIVE (A) NEGATIVE Final   Influenza B by PCR NEGATIVE NEGATIVE Final    Comment: (NOTE) The Xpert Xpress SARS-CoV-2/FLU/RSV plus assay is intended as an aid in the diagnosis of influenza from Nasopharyngeal swab specimens and should not be used as a sole basis for treatment. Nasal washings and aspirates are unacceptable for Xpert Xpress SARS-CoV-2/FLU/RSV testing.  Fact Sheet for Patients: EntrepreneurPulse.com.au  Fact Sheet for Healthcare Providers: IncredibleEmployment.be  This test is not yet approved or cleared by the [place] FDA and has been authorized for detection and/or diagnosis of SARS-CoV-2 by FDA under an Emergency Use Authorization (EUA). This EUA will remain in effect (meaning this test can be used) for the duration of the COVID-19 declaration under Section 564(b)(1) of the Act, 21 U.S.C. section 360bbb-3(b)(1), unless the authorization is terminated or revoked.     Resp Syncytial Virus by PCR NEGATIVE NEGATIVE Final    Comment: (NOTE) Fact Sheet for Patients: EntrepreneurPulse.com.au  Fact Sheet for Healthcare Providers: IncredibleEmployment.be  This test is not yet approved or cleared by the [place] FDA and has been authorized for detection and/or diagnosis of SARS-CoV-2 by FDA under an Emergency Use Authorization (EUA). This EUA will remain in effect (meaning this test can be used) for the duration of the COVID-19 declaration under Section 564(b)(1) of the Act, 21 U.S.C. section 360bbb-3(b)(1), unless the authorization is terminated or revoked.  Performed at [facility], [place] [address], [place], [place] [address]      Medications:    atomoxetine  60 mg Oral Daily   busPIRone  10 mg Oral TID   dicyclomine  10 mg Oral TID AC   enoxaparin (LOVENOX) injection  40 mg Subcutaneous Q24H   guaiFENesin  600 mg Oral BID   methylPREDNISolone  (SOLU-MEDROL) injection  40 mg Intravenous Q12H   mometasone-formoterol  2 puff Inhalation BID   montelukast  10 mg Oral QHS   OLANZapine  10 mg Oral QHS   oseltamivir  75 mg Oral BID   pantoprazole  40 mg Oral Daily   sodium chloride flush  3 mL Intravenous Q12H   umeclidinium bromide  1 puff Inhalation Daily   venlafaxine XR  37.5 mg Oral Q breakfast   Continuous Infusions:      LOS: 1 day   Charlynne Cousins  [facility]  05/17/2022, 9:11 AM

## 2022-05-18 DIAGNOSIS — J4541 Moderate persistent asthma with (acute) exacerbation: Secondary | ICD-10-CM

## 2022-05-18 DIAGNOSIS — J9601 Acute respiratory failure with hypoxia: Secondary | ICD-10-CM | POA: Diagnosis not present

## 2022-05-18 DIAGNOSIS — J101 Influenza due to other identified influenza virus with other respiratory manifestations: Secondary | ICD-10-CM | POA: Diagnosis not present

## 2022-05-18 MED ORDER — PREDNISONE 10 MG PO TABS: ORAL_TABLET | ORAL | 0 refills | Status: DC

## 2022-05-18 MED ORDER — OSELTAMIVIR PHOSPHATE 75 MG PO CAPS: 75.0000 mg | ORAL_CAPSULE | Freq: Two times a day (BID) | ORAL | 0 refills | Status: AC

## 2022-05-18 NOTE — Discharge Summary (Signed)
Physician Discharge Summary  [name] WUJ:[id number] DOB: 21-Nov-1980 DOA: 05/15/2022  PCP: Roe RutherfordKeatts, Courtney, NP  Admit date: 05/15/2022 Discharge date: 05/18/2022  Admitted From: Home Disposition:  home  Recommendations for Outpatient Follow-up:  Follow up with PCP in 1-2 weeks Please obtain BMP/CBC in one week   Home Health:No Equipment/Devices:None  Discharge Condition:Stable CODE STATUS:Full Diet recommendation: Heart Healthy   Brief/Interim Summary: 41 y.o. female past medical history significant for asthma, GERD depression anxiety comes in for shortness of breath the ED was noted to have saturations of 84% started on Solu-Medrol DuoNebs and placed on 2 L of oxygen   Discharge Diagnoses:  Principal Problem:   Acute respiratory failure with hypoxia (HCC) Active Problems:   Asthma, chronic, unspecified asthma severity, with acute exacerbation   Influenza A   Hypertension   Elevated troponin   Depression with anxiety  Acute respiratory failure with hypoxia due to influenza A and acute asthma exacerbation: Was placed on 4 L of oxygen admission started on Tamiflu and steroids we were able to wean her to room air should continue Tamiflu for total 5 days as an outpatient and she will continue steroid taper as an outpatient.  Elevated troponins: Likely demand ischemia.  Essential hypertension no changes made to her medication continue current regimen.  Depression and anxiety: Continue current home meds no changes made.  Discharge Instructions  Discharge Instructions     Diet - low sodium heart healthy   Complete by: As directed    Increase activity slowly   Complete by: As directed       Allergies as of 05/18/2022       Reactions   Alprazolam Itching   Oxycodone Itching   Penicillins Nausea And Vomiting, Other (See Comments)   Has patient had a PCN reaction causing immediate rash, facial/tongue/throat swelling, SOB or lightheadedness with  hypotension:No Has patient had a PCN reaction causing severe rash involving mucus membranes or skin necrosis:No Has patient had a PCN reaction that REACTION THAT REQUIRED HOSPITALIZATION:  #  #  #  YES  #  #  #  Has patient had a PCN reaction occurring within the last 10 years: #  #  #  YES  #  #  #    Norvasc [amlodipine Besylate] Other (See Comments)   Tip of tongue numb and flushing        Medication List     TAKE these medications    albuterol 108 (90 Base) MCG/ACT inhaler Commonly known as: ProAir HFA INHALE 1-2 PUFFS INTO THE LUNGS EVERY 6 (SIX) HOURS AS NEEDED FOR WHEEZING OR SHORTNESS OF BREATH. What changed: how much to take   buPROPion 150 MG 12 hr tablet Commonly known as: WELLBUTRIN SR Take 1 tablet (150 mg total) by mouth in the morning and at bedtime.   busPIRone 10 MG tablet Commonly known as: BUSPAR Take 10 mg by mouth 3 (three) times daily.   diazepam 10 MG tablet Commonly known as: VALIUM Take 5-10 mg by mouth every 8 (eight) hours as needed for anxiety.   dicyclomine 10 MG capsule Commonly known as: BENTYL Take 10 mg by mouth 3 (three) times daily before meals.   ipratropium 0.03 % nasal spray Commonly known as: ATROVENT Place 2 sprays into both nostrils 3 (three) times daily between meals as needed for rhinitis.   losartan 50 MG tablet Commonly known as: COZAAR Take 50 mg by mouth daily.   methocarbamol 500 MG tablet Commonly known as: ROBAXIN  Take 1 tablet by mouth every 6 (six) hours as needed for muscle spasms.   methylphenidate 18 MG CR tablet Commonly known as: CONCERTA Take 18 mg by mouth daily.   montelukast 10 MG tablet Commonly known as: SINGULAIR Take 10 mg by mouth at bedtime.   nicotine 21 mg/24hr patch Commonly known as: NICODERM CQ - dosed in mg/24 hours Place 21 mg onto the skin daily as needed (for smoking cessation).   OLANZapine 10 MG tablet Commonly known as: ZYPREXA Take 10 mg by mouth at bedtime.   oseltamivir 75  MG capsule Commonly known as: TAMIFLU Take 1 capsule (75 mg total) by mouth 2 (two) times daily for 2 days.   pantoprazole 40 MG tablet Commonly known as: PROTONIX TAKE 1 TABLET BY MOUTH EVERY DAY What changed: when to take this   predniSONE 10 MG tablet Commonly known as: DELTASONE Takes 6 tablets for 1 days, then 5 tablets for 1 days, then 4 tablets for 1 days, then 3 tablets for 1 days, then 2 tabs for 1 days, then 1 tab for 1 days, and then stop.   Spiriva Respimat 2.5 MCG/ACT Aers Generic drug: Tiotropium Bromide Monohydrate Inhale 2 puffs into the lungs daily.   Symbicort 160-4.5 MCG/ACT inhaler Generic drug: budesonide-formoterol Inhale 2 puffs into the lungs 2 (two) times daily.   venlafaxine XR 37.5 MG 24 hr capsule Commonly known as: EFFEXOR-XR Take 37.5 mg by mouth daily with breakfast.        Allergies  Allergen Reactions   Alprazolam Itching   Oxycodone Itching   Penicillins Nausea And Vomiting and Other (See Comments)    Has patient had a PCN reaction causing immediate rash, facial/tongue/throat swelling, SOB or lightheadedness with hypotension:No Has patient had a PCN reaction causing severe rash involving mucus membranes or skin necrosis:No Has patient had a PCN reaction that REACTION THAT REQUIRED HOSPITALIZATION:  #  #  #  YES  #  #  #  Has patient had a PCN reaction occurring within the last 10 years: #  #  #  YES  #  #  #    Norvasc [Amlodipine Besylate] Other (See Comments)    Tip of tongue numb and flushing    Consultations: None   Procedures/Studies: DG Chest 2 View  Result Date: 05/15/2022 CLINICAL DATA:  Dyspnea.  History of asthma. EXAM: CHEST - 2 VIEW COMPARISON:  08/29/2021 FINDINGS: Airway thickening is present, suggesting bronchitis or reactive airways disease. Cardiac and mediastinal margins appear normal. No airspace opacity or blunting of the costophrenic angles. No significant bony abnormality observed. IMPRESSION: 1. Airway  thickening is present, suggesting bronchitis or reactive airways disease. Electronically Signed   By: Gaylyn Rong M.D.   On: 05/15/2022 19:22   (Echo, Carotid, EGD, Colonoscopy, ERCP)    Subjective:   Discharge Exam: Vitals:   05/18/22 0624 05/18/22 0632  BP: (!) 148/109   Pulse: 76   Resp: 16   Temp: (!) 97.4 F (36.3 C)   SpO2: 92% 92%   Vitals:   05/17/22 0436 05/17/22 1152 05/18/22 0624 05/18/22 0632  BP: 119/79 120/75 (!) 148/109   Pulse: 78 91 76   Resp: 16 16 16    Temp: 97.6 F (36.4 C) 98 F (36.7 C) (!) 97.4 F (36.3 C)   TempSrc: Oral Oral Oral   SpO2: 94% 92% 92% 92%  Weight:      Height:        General: Pt is alert, awake, not  in acute distress Cardiovascular: RRR, S1/S2 +, no rubs, no gallops Respiratory: CTA bilaterally, no wheezing, no rhonchi Abdominal: Soft, NT, ND, bowel sounds + Extremities: no edema, no cyanosis    The results of significant diagnostics from this hospitalization (including imaging, microbiology, ancillary and laboratory) are listed below for reference.     Microbiology: Recent Results (from the past 240 hour(s))  Resp panel by RT-PCR (RSV, Flu A&B, Covid) Anterior Nasal Swab     Status: Abnormal   Collection Time: 05/15/22  6:55 PM   Specimen: Anterior Nasal Swab  Result Value Ref Range Status   SARS Coronavirus 2 by RT PCR NEGATIVE NEGATIVE Final    Comment: (NOTE) SARS-CoV-2 target nucleic acids are NOT DETECTED.  The SARS-CoV-2 RNA is generally detectable in upper respiratory specimens during the acute phase of infection. The lowest concentration of SARS-CoV-2 viral copies this assay can detect is 138 copies/mL. A negative result does not preclude SARS-Cov-2 infection and should not be used as the sole basis for treatment or other patient management decisions. A negative result may occur with  improper specimen collection/handling, submission of specimen other than nasopharyngeal swab, presence of viral  mutation(s) within the areas targeted by this assay, and inadequate number of viral copies(<138 copies/mL). A negative result must be combined with clinical observations, patient history, and epidemiological information. The expected result is Negative.  Fact Sheet for Patients:  BloggerCourse.com  Fact Sheet for Healthcare Providers:  SeriousBroker.it  This test is no t yet approved or cleared by the [place] FDA and  has been authorized for detection and/or diagnosis of SARS-CoV-2 by FDA under an Emergency Use Authorization (EUA). This EUA will remain  in effect (meaning this test can be used) for the duration of the COVID-19 declaration under Section 564(b)(1) of the Act, 21 U.S.C.section 360bbb-3(b)(1), unless the authorization is terminated  or revoked sooner.       Influenza A by PCR POSITIVE (A) NEGATIVE Final   Influenza B by PCR NEGATIVE NEGATIVE Final    Comment: (NOTE) The Xpert Xpress SARS-CoV-2/FLU/RSV plus assay is intended as an aid in the diagnosis of influenza from Nasopharyngeal swab specimens and should not be used as a sole basis for treatment. Nasal washings and aspirates are unacceptable for Xpert Xpress SARS-CoV-2/FLU/RSV testing.  Fact Sheet for Patients: BloggerCourse.com  Fact Sheet for Healthcare Providers: SeriousBroker.it  This test is not yet approved or cleared by the [place] FDA and has been authorized for detection and/or diagnosis of SARS-CoV-2 by FDA under an Emergency Use Authorization (EUA). This EUA will remain in effect (meaning this test can be used) for the duration of the COVID-19 declaration under Section 564(b)(1) of the Act, 21 U.S.C. section 360bbb-3(b)(1), unless the authorization is terminated or revoked.     Resp Syncytial Virus by PCR NEGATIVE NEGATIVE Final    Comment: (NOTE) Fact Sheet for  Patients: BloggerCourse.com  Fact Sheet for Healthcare Providers: SeriousBroker.it  This test is not yet approved or cleared by the [place] FDA and has been authorized for detection and/or diagnosis of SARS-CoV-2 by FDA under an Emergency Use Authorization (EUA). This EUA will remain in effect (meaning this test can be used) for the duration of the COVID-19 declaration under Section 564(b)(1) of the Act, 21 U.S.C. section 360bbb-3(b)(1), unless the authorization is terminated or revoked.  Performed at [facility], [address], [place], [place] [address]      Labs: BNP (last 3 results) No results for input(s): "BNP"  in the last 8760 hours. Basic Metabolic Panel: Recent Labs  Lab 05/15/22 1800 05/16/22 0651  NA 139 142  K 3.7 5.0  CL 106 108  CO2 28 27  GLUCOSE 110* 150*  BUN 6 7  CREATININE 0.89 0.89  CALCIUM 8.8* 8.8*  MG 2.1  --    Liver Function Tests: No results for input(s): "AST", "ALT", "ALKPHOS", "BILITOT", "PROT", "ALBUMIN" in the last 168 hours. No results for input(s): "LIPASE", "AMYLASE" in the last 168 hours. No results for input(s): "AMMONIA" in the last 168 hours. CBC: Recent Labs  Lab 05/15/22 1800 05/16/22 0651  WBC 9.3 7.5  NEUTROABS 7.8*  --   HGB 13.6 12.9  HCT 46.1* 42.6  MCV 103.1* 102.7*  PLT 228 210   Cardiac Enzymes: No results for input(s): "CKTOTAL", "CKMB", "CKMBINDEX", "TROPONINI" in the last 168 hours. BNP: Invalid input(s): "POCBNP" CBG: No results for input(s): "GLUCAP" in the last 168 hours. D-Dimer No results for input(s): "DDIMER" in the last 72 hours. Hgb A1c No results for input(s): "HGBA1C" in the last 72 hours. Lipid Profile No results for input(s): "CHOL", "HDL", "LDLCALC", "TRIG", "CHOLHDL", "LDLDIRECT" in the last 72 hours. Thyroid function studies No results for input(s): "TSH", "T4TOTAL", "T3FREE", "THYROIDAB" in the last 72  hours.  Invalid input(s): "FREET3" Anemia work up No results for input(s): "VITAMINB12", "FOLATE", "FERRITIN", "TIBC", "IRON", "RETICCTPCT" in the last 72 hours. Urinalysis    Component Value Date/Time   COLORURINE YELLOW 02/17/2020 1930   APPEARANCEUR HAZY (A) 02/17/2020 1930   LABSPEC 1.020 02/17/2020 1930   PHURINE 6.0 02/17/2020 1930   GLUCOSEU NEGATIVE 02/17/2020 1930   HGBUR NEGATIVE 02/17/2020 1930   BILIRUBINUR NEGATIVE 02/17/2020 1930   BILIRUBINUR negative 04/06/2018 1652   BILIRUBINUR negative 08/21/2015 1551   KETONESUR NEGATIVE 02/17/2020 1930   PROTEINUR 30 (A) 02/17/2020 1930   UROBILINOGEN 0.2 04/06/2018 1652   UROBILINOGEN 0.2 01/13/2017 1313   NITRITE NEGATIVE 02/17/2020 1930   LEUKOCYTESUR NEGATIVE 02/17/2020 1930   Sepsis Labs Recent Labs  Lab 05/15/22 1800 05/16/22 0651  WBC 9.3 7.5   Microbiology Recent Results (from the past 240 hour(s))  Resp panel by RT-PCR (RSV, Flu A&B, Covid) Anterior Nasal Swab     Status: Abnormal   Collection Time: 05/15/22  6:55 PM   Specimen: Anterior Nasal Swab  Result Value Ref Range Status   SARS Coronavirus 2 by RT PCR NEGATIVE NEGATIVE Final    Comment: (NOTE) SARS-CoV-2 target nucleic acids are NOT DETECTED.  The SARS-CoV-2 RNA is generally detectable in upper respiratory specimens during the acute phase of infection. The lowest concentration of SARS-CoV-2 viral copies this assay can detect is 138 copies/mL. A negative result does not preclude SARS-Cov-2 infection and should not be used as the sole basis for treatment or other patient management decisions. A negative result may occur with  improper specimen collection/handling, submission of specimen other than nasopharyngeal swab, presence of viral mutation(s) within the areas targeted by this assay, and inadequate number of viral copies(<138 copies/mL). A negative result must be combined with clinical observations, patient history, and  epidemiological information. The expected result is Negative.  Fact Sheet for Patients:  BloggerCourse.com  Fact Sheet for Healthcare Providers:  SeriousBroker.it  This test is no t yet approved or cleared by the [place] FDA and  has been authorized for detection and/or diagnosis of SARS-CoV-2 by FDA under an Emergency Use Authorization (EUA). This EUA will remain  in effect (meaning this test can be used) for  the duration of the COVID-19 declaration under Section 564(b)(1) of the Act, 21 U.S.C.section 360bbb-3(b)(1), unless the authorization is terminated  or revoked sooner.       Influenza A by PCR POSITIVE (A) NEGATIVE Final   Influenza B by PCR NEGATIVE NEGATIVE Final    Comment: (NOTE) The Xpert Xpress SARS-CoV-2/FLU/RSV plus assay is intended as an aid in the diagnosis of influenza from Nasopharyngeal swab specimens and should not be used as a sole basis for treatment. Nasal washings and aspirates are unacceptable for Xpert Xpress SARS-CoV-2/FLU/RSV testing.  Fact Sheet for Patients: BloggerCourse.com  Fact Sheet for Healthcare Providers: SeriousBroker.it  This test is not yet approved or cleared by the [place] FDA and has been authorized for detection and/or diagnosis of SARS-CoV-2 by FDA under an Emergency Use Authorization (EUA). This EUA will remain in effect (meaning this test can be used) for the duration of the COVID-19 declaration under Section 564(b)(1) of the Act, 21 U.S.C. section 360bbb-3(b)(1), unless the authorization is terminated or revoked.     Resp Syncytial Virus by PCR NEGATIVE NEGATIVE Final    Comment: (NOTE) Fact Sheet for Patients: BloggerCourse.com  Fact Sheet for Healthcare Providers: SeriousBroker.it  This test is not yet approved or cleared by the [place] FDA and has  been authorized for detection and/or diagnosis of SARS-CoV-2 by FDA under an Emergency Use Authorization (EUA). This EUA will remain in effect (meaning this test can be used) for the duration of the COVID-19 declaration under Section 564(b)(1) of the Act, 21 U.S.C. section 360bbb-3(b)(1), unless the authorization is terminated or revoked.  Performed at [facility], [address] [place], [place] [address]      Time coordinating discharge: Over 30 minutes  SIGNED:   Marinda Elk, MD  [facility] 05/18/2022, 11:28 AM Pager   If 7PM-7AM, please contact night-coverage [email or web address] Password [email or web address]

## 2022-05-18 NOTE — Progress Notes (Signed)
Patient discharging home. Vital signs stable at time of discharge as reflected in discharge summary. Discharge instructions given and verbal understanding returned. No questions at this time. 

## 2022-05-24 ENCOUNTER — Encounter (HOSPITAL_COMMUNITY): Payer: Self-pay

## 2022-05-24 ENCOUNTER — Other Ambulatory Visit: Payer: Self-pay

## 2022-05-24 ENCOUNTER — Emergency Department (HOSPITAL_COMMUNITY): Payer: Medicaid Other

## 2022-05-24 ENCOUNTER — Inpatient Hospital Stay (HOSPITAL_COMMUNITY)
Admission: EM | Admit: 2022-05-24 | Discharge: 2022-05-28 | DRG: 202 | Disposition: A | Discharge status: Home / Self Care | Payer: Medicaid Other | Attending: Family Medicine | Admitting: Family Medicine

## 2022-05-24 DIAGNOSIS — Z79899 Other long term (current) drug therapy: Secondary | ICD-10-CM

## 2022-05-24 DIAGNOSIS — Z87442 Personal history of urinary calculi: Secondary | ICD-10-CM

## 2022-05-24 DIAGNOSIS — Z880 Allergy status to penicillin: Secondary | ICD-10-CM

## 2022-05-24 DIAGNOSIS — Z23 Encounter for immunization: Secondary | ICD-10-CM

## 2022-05-24 DIAGNOSIS — Z888 Allergy status to other drugs, medicaments and biological substances status: Secondary | ICD-10-CM

## 2022-05-24 DIAGNOSIS — J9621 Acute and chronic respiratory failure with hypoxia: Principal | ICD-10-CM

## 2022-05-24 DIAGNOSIS — Z885 Allergy status to narcotic agent status: Secondary | ICD-10-CM

## 2022-05-24 DIAGNOSIS — F419 Anxiety disorder, unspecified: Secondary | ICD-10-CM | POA: Diagnosis present

## 2022-05-24 DIAGNOSIS — K219 Gastro-esophageal reflux disease without esophagitis: Secondary | ICD-10-CM | POA: Diagnosis present

## 2022-05-24 DIAGNOSIS — J4489 Other specified chronic obstructive pulmonary disease: Secondary | ICD-10-CM | POA: Diagnosis present

## 2022-05-24 DIAGNOSIS — I10 Essential (primary) hypertension: Secondary | ICD-10-CM | POA: Diagnosis present

## 2022-05-24 DIAGNOSIS — J9600 Acute respiratory failure, unspecified whether with hypoxia or hypercapnia: Secondary | ICD-10-CM | POA: Diagnosis present

## 2022-05-24 DIAGNOSIS — J45901 Unspecified asthma with (acute) exacerbation: Principal | ICD-10-CM | POA: Diagnosis present

## 2022-05-24 DIAGNOSIS — Z833 Family history of diabetes mellitus: Secondary | ICD-10-CM

## 2022-05-24 DIAGNOSIS — J441 Chronic obstructive pulmonary disease with (acute) exacerbation: Secondary | ICD-10-CM | POA: Diagnosis present

## 2022-05-24 DIAGNOSIS — I3139 Other pericardial effusion (noninflammatory): Secondary | ICD-10-CM | POA: Diagnosis present

## 2022-05-24 DIAGNOSIS — F32A Depression, unspecified: Secondary | ICD-10-CM | POA: Diagnosis present

## 2022-05-24 DIAGNOSIS — Z6830 Body mass index (BMI) 30.0-30.9, adult: Secondary | ICD-10-CM

## 2022-05-24 DIAGNOSIS — E669 Obesity, unspecified: Secondary | ICD-10-CM | POA: Diagnosis present

## 2022-05-24 DIAGNOSIS — F17210 Nicotine dependence, cigarettes, uncomplicated: Secondary | ICD-10-CM | POA: Diagnosis present

## 2022-05-24 DIAGNOSIS — Z825 Family history of asthma and other chronic lower respiratory diseases: Secondary | ICD-10-CM

## 2022-05-24 DIAGNOSIS — Z7951 Long term (current) use of inhaled steroids: Secondary | ICD-10-CM

## 2022-05-24 DIAGNOSIS — F418 Other specified anxiety disorders: Secondary | ICD-10-CM | POA: Diagnosis present

## 2022-05-24 DIAGNOSIS — Z720 Tobacco use: Secondary | ICD-10-CM | POA: Diagnosis present

## 2022-05-24 DIAGNOSIS — Z20822 Contact with and (suspected) exposure to covid-19: Secondary | ICD-10-CM | POA: Diagnosis present

## 2022-05-24 DIAGNOSIS — Z808 Family history of malignant neoplasm of other organs or systems: Secondary | ICD-10-CM

## 2022-05-24 DIAGNOSIS — E860 Dehydration: Secondary | ICD-10-CM | POA: Diagnosis present

## 2022-05-24 DIAGNOSIS — F17200 Nicotine dependence, unspecified, uncomplicated: Secondary | ICD-10-CM | POA: Diagnosis present

## 2022-05-24 DIAGNOSIS — Z832 Family history of diseases of the blood and blood-forming organs and certain disorders involving the immune mechanism: Secondary | ICD-10-CM

## 2022-05-24 DIAGNOSIS — Z9049 Acquired absence of other specified parts of digestive tract: Secondary | ICD-10-CM

## 2022-05-24 DIAGNOSIS — Z822 Family history of deafness and hearing loss: Secondary | ICD-10-CM

## 2022-05-24 LAB — CBC WITH DIFFERENTIAL/PLATELET
Abs Immature Granulocytes: 0.31 10*3/uL — ABNORMAL HIGH (ref 0.00–0.07)
Basophils Absolute: 0.1 10*3/uL (ref 0.0–0.1)
Basophils Relative: 1 %
Eosinophils Absolute: 0 10*3/uL (ref 0.0–0.5)
Eosinophils Relative: 0 %
HCT: 47.8 % — ABNORMAL HIGH (ref 36.0–46.0)
Hemoglobin: 14.7 g/dL (ref 12.0–15.0)
Immature Granulocytes: 3 %
Lymphocytes Relative: 14 %
Lymphs Abs: 1.7 10*3/uL (ref 0.7–4.0)
MCH: 30.5 pg (ref 26.0–34.0)
MCHC: 30.8 g/dL (ref 30.0–36.0)
MCV: 99.2 fL (ref 80.0–100.0)
Monocytes Absolute: 1 10*3/uL (ref 0.1–1.0)
Monocytes Relative: 8 %
Neutro Abs: 9.1 10*3/uL — ABNORMAL HIGH (ref 1.7–7.7)
Neutrophils Relative %: 74 %
Platelets: 343 10*3/uL (ref 150–400)
RBC: 4.82 MIL/uL (ref 3.87–5.11)
RDW: 13.9 % (ref 11.5–15.5)
WBC: 12.2 10*3/uL — ABNORMAL HIGH (ref 4.0–10.5)
nRBC: 0 % (ref 0.0–0.2)

## 2022-05-24 LAB — COMPREHENSIVE METABOLIC PANEL
ALT: 22 U/L (ref 0–44)
AST: 21 U/L (ref 15–41)
Albumin: 3.5 g/dL (ref 3.5–5.0)
Alkaline Phosphatase: 76 U/L (ref 38–126)
Anion gap: 8 (ref 5–15)
BUN: 9 mg/dL (ref 6–20)
CO2: 31 mmol/L (ref 22–32)
Calcium: 8.5 mg/dL — ABNORMAL LOW (ref 8.9–10.3)
Chloride: 97 mmol/L — ABNORMAL LOW (ref 98–111)
Creatinine, Ser: 1.02 mg/dL — ABNORMAL HIGH (ref 0.44–1.00)
GFR, Estimated: >60 mL/min (ref >60)
Glucose, Bld: 110 mg/dL — ABNORMAL HIGH (ref 70–99)
Potassium: 3.8 mmol/L (ref 3.5–5.1)
Sodium: 136 mmol/L (ref 135–145)
Total Bilirubin: 0.5 mg/dL (ref 0.3–1.2)
Total Protein: 6.8 g/dL (ref 6.5–8.1)

## 2022-05-24 LAB — BLOOD GAS, VENOUS
Acid-Base Excess: 13.5 mmol/L — ABNORMAL HIGH (ref 0.0–2.0)
Bicarbonate: 41.8 mmol/L — ABNORMAL HIGH (ref 20.0–28.0)
O2 Saturation: 87.2 %
Patient temperature: 37
pCO2, Ven: 69 mmHg — ABNORMAL HIGH (ref 44–60)
pH, Ven: 7.39 (ref 7.25–7.43)
pO2, Ven: 52 mmHg — ABNORMAL HIGH (ref 32–45)

## 2022-05-24 LAB — RESP PANEL BY RT-PCR (RSV, FLU A&B, COVID)  RVPGX2
Influenza A by PCR: NEGATIVE
Influenza B by PCR: NEGATIVE
Resp Syncytial Virus by PCR: NEGATIVE
SARS Coronavirus 2 by RT PCR: NEGATIVE

## 2022-05-24 LAB — I-STAT BETA HCG BLOOD, ED (MC, WL, AP ONLY): I-stat hCG, quantitative: <5 m[IU]/mL (ref <5)

## 2022-05-24 LAB — D-DIMER, QUANTITATIVE: D-Dimer, Quant: 0.61 ug/mL-FEU — ABNORMAL HIGH (ref 0.00–0.50)

## 2022-05-24 MED ORDER — IPRATROPIUM-ALBUTEROL 0.5-2.5 (3) MG/3ML IN SOLN
3.0000 mL | Freq: Once | RESPIRATORY_TRACT | Status: AC
  Administered 2022-05-24: 3 mL via RESPIRATORY_TRACT
  Filled 2022-05-24: qty 3

## 2022-05-24 MED ORDER — IOHEXOL 350 MG/ML SOLN
75.0000 mL | Freq: Once | INTRAVENOUS | Status: AC | PRN
  Administered 2022-05-24: 75 mL via INTRAVENOUS

## 2022-05-24 MED ORDER — METHOCARBAMOL 500 MG PO TABS
500.0000 mg | ORAL_TABLET | Freq: Four times a day (QID) | ORAL | Status: DC | PRN
  Administered 2022-05-24 – 2022-05-28 (×7): 500 mg via ORAL
  Filled 2022-05-24 (×8): qty 1

## 2022-05-24 MED ORDER — IPRATROPIUM-ALBUTEROL 0.5-2.5 (3) MG/3ML IN SOLN
3.0000 mL | RESPIRATORY_TRACT | Status: DC
  Administered 2022-05-24 – 2022-05-26 (×6): 3 mL via RESPIRATORY_TRACT
  Filled 2022-05-24 (×8): qty 3

## 2022-05-24 MED ORDER — METHYLPHENIDATE HCL ER (OSM) 18 MG PO TBCR: 18.0000 mg | EXTENDED_RELEASE_TABLET | Freq: Every day | ORAL | Status: DC

## 2022-05-24 MED ORDER — NICOTINE 7 MG/24HR TD PT24
21.0000 mg | MEDICATED_PATCH | Freq: Every day | TRANSDERMAL | Status: DC
  Administered 2022-05-24 – 2022-05-28 (×5): 21 mg via TRANSDERMAL
  Filled 2022-05-24 (×6): qty 1

## 2022-05-24 MED ORDER — GUAIFENESIN ER 600 MG PO TB12
600.0000 mg | ORAL_TABLET | Freq: Two times a day (BID) | ORAL | Status: DC
  Administered 2022-05-24 – 2022-05-28 (×8): 600 mg via ORAL
  Filled 2022-05-24 (×8): qty 1

## 2022-05-24 MED ORDER — INFLUENZA VAC SPLIT QUAD 0.5 ML IM SUSY
0.5000 mL | PREFILLED_SYRINGE | INTRAMUSCULAR | Status: AC
  Administered 2022-05-25: 0.5 mL via INTRAMUSCULAR
  Filled 2022-05-24: qty 0.5

## 2022-05-24 MED ORDER — OLANZAPINE 5 MG PO TABS
10.0000 mg | ORAL_TABLET | Freq: Every day | ORAL | Status: DC
  Administered 2022-05-24 – 2022-05-27 (×4): 10 mg via ORAL
  Filled 2022-05-24 (×4): qty 2

## 2022-05-24 MED ORDER — SODIUM CHLORIDE 0.9 % IV SOLN: INTRAVENOUS | Status: AC

## 2022-05-24 MED ORDER — LACTATED RINGERS IV BOLUS
1000.0000 mL | Freq: Once | INTRAVENOUS | Status: AC
  Administered 2022-05-24: 1000 mL via INTRAVENOUS

## 2022-05-24 MED ORDER — MONTELUKAST SODIUM 10 MG PO TABS
10.0000 mg | ORAL_TABLET | Freq: Every day | ORAL | Status: DC
  Administered 2022-05-24 – 2022-05-27 (×4): 10 mg via ORAL
  Filled 2022-05-24 (×4): qty 1

## 2022-05-24 MED ORDER — VENLAFAXINE HCL ER 37.5 MG PO CP24
37.5000 mg | ORAL_CAPSULE | Freq: Every day | ORAL | Status: DC
  Administered 2022-05-25 – 2022-05-28 (×4): 37.5 mg via ORAL
  Filled 2022-05-24 (×4): qty 1

## 2022-05-24 MED ORDER — METHYLPREDNISOLONE SODIUM SUCC 40 MG IJ SOLR
40.0000 mg | Freq: Two times a day (BID) | INTRAMUSCULAR | Status: DC
  Administered 2022-05-24 – 2022-05-25 (×2): 40 mg via INTRAVENOUS
  Filled 2022-05-24 (×2): qty 1

## 2022-05-24 MED ORDER — METHYLPREDNISOLONE SODIUM SUCC 125 MG IJ SOLR
125.0000 mg | Freq: Once | INTRAMUSCULAR | Status: AC
  Administered 2022-05-24: 125 mg via INTRAVENOUS
  Filled 2022-05-24: qty 2

## 2022-05-24 MED ORDER — DICYCLOMINE HCL 10 MG PO CAPS
10.0000 mg | ORAL_CAPSULE | Freq: Three times a day (TID) | ORAL | Status: DC
  Administered 2022-05-25 – 2022-05-28 (×10): 10 mg via ORAL
  Filled 2022-05-24 (×11): qty 1

## 2022-05-24 MED ORDER — BUSPIRONE HCL 5 MG PO TABS
10.0000 mg | ORAL_TABLET | Freq: Three times a day (TID) | ORAL | Status: DC
  Administered 2022-05-24 – 2022-05-28 (×11): 10 mg via ORAL
  Filled 2022-05-24 (×10): qty 2

## 2022-05-24 MED ORDER — DIAZEPAM 5 MG PO TABS
5.0000 mg | ORAL_TABLET | Freq: Three times a day (TID) | ORAL | Status: DC | PRN
  Administered 2022-05-25 – 2022-05-28 (×6): 5 mg via ORAL
  Filled 2022-05-24 (×6): qty 1

## 2022-05-24 MED ORDER — DIAZEPAM 5 MG PO TABS: 5.0000 mg | ORAL_TABLET | Freq: Three times a day (TID) | ORAL | Status: DC | PRN

## 2022-05-24 MED ORDER — ENOXAPARIN SODIUM 40 MG/0.4ML IJ SOSY
40.0000 mg | PREFILLED_SYRINGE | INTRAMUSCULAR | Status: DC
  Administered 2022-05-24 – 2022-05-27 (×4): 40 mg via SUBCUTANEOUS
  Filled 2022-05-24 (×4): qty 0.4

## 2022-05-24 MED ORDER — ALBUTEROL SULFATE (2.5 MG/3ML) 0.083% IN NEBU
2.5000 mg | INHALATION_SOLUTION | Freq: Once | RESPIRATORY_TRACT | Status: AC
  Administered 2022-05-24: 2.5 mg via RESPIRATORY_TRACT
  Filled 2022-05-24: qty 3

## 2022-05-24 MED ORDER — PANTOPRAZOLE SODIUM 40 MG PO TBEC
40.0000 mg | DELAYED_RELEASE_TABLET | Freq: Every day | ORAL | Status: DC
  Administered 2022-05-25 – 2022-05-28 (×4): 40 mg via ORAL
  Filled 2022-05-24 (×4): qty 1

## 2022-05-24 MED ORDER — MAGNESIUM SULFATE 2 GM/50ML IV SOLN
2.0000 g | Freq: Once | INTRAVENOUS | Status: AC
  Administered 2022-05-24: 2 g via INTRAVENOUS
  Filled 2022-05-24: qty 50

## 2022-05-24 MED ORDER — LOSARTAN POTASSIUM 50 MG PO TABS
50.0000 mg | ORAL_TABLET | Freq: Every day | ORAL | Status: DC
  Administered 2022-05-25 – 2022-05-28 (×4): 50 mg via ORAL
  Filled 2022-05-24 (×4): qty 1

## 2022-05-24 NOTE — ED Provider Triage Note (Signed)
Emergency Medicine Provider Triage Evaluation Note  [name] , a 42 y.o. female  was evaluated in triage.  Pt complains of shortness of breath.  Patient reports she tested positive for influenza in late December and folic she had been improving but has now started to have some increased shortness of breath for the last several days.  She reports that she has a history of COPD.  Given a treatment of DuoNeb in the ambulance which patient reports did help symptoms somewhat.  Denies chest pain, abdominal pain, nausea, vomiting, diarrhea.  Review of Systems  Positive: As above Negative: As above  Physical Exam  BP (!) 135/103 (BP Location: Left Arm)   Pulse (!) 122   Temp 99 F (37.2 C) (Oral)   Resp (!) 24   LMP 05/13/2022   SpO2 90%  Gen:   Awake, no distress  Resp:  Normal effort, diffuse wheezing in all lung fields MSK:   Moves extremities without difficulty  Other:    Medical Decision Making  Medically screening exam initiated at 12:50 PM.  Appropriate orders placed.  [name] was informed that the remainder of the evaluation will be completed by another provider, this initial triage assessment does not replace that evaluation, and the importance of remaining in the ED until their evaluation is complete.     Melissa Heller, PA-C 05/24/22 1251

## 2022-05-24 NOTE — ED Triage Notes (Signed)
Per EMS- Patient reports that she was diagnosed with the flu on 05/16/22. Today, the patient went back to work and had increased SOB and wheezing. Patient reports a history of COPD.  EMS gave a DuoNeb prior to arrival to the ED. Patient continues to  have expiratory wheezing, but better.  Patient also c/o a productive cough with brown sputum.

## 2022-05-24 NOTE — ED Notes (Signed)
I sent urine sample and urine culture to the main lab

## 2022-05-24 NOTE — ED Provider Notes (Signed)
[facility] DEPT Provider Note   CSN: [id number] Arrival date & time: 05/24/22  1217     History  Chief Complaint  Patient presents with   Shortness of Breath    [name] is a 42 y.o. female.  HPI 42 year old female presents with dyspnea. She was discharged less than a week ago for the flu and an asthma exacerbation.  She has finished the Tamiflu and steroids, finished the steroids yesterday.  Since last night has developed recurrent dyspnea and then it was a lot worse today.  This is especially on exertion.  No fevers but is having a cough with green/brown sputum.  She has some mild chest discomfort when she coughs but no chest pain at rest.  No leg swelling.  She was given a breathing treatment by EMS with some partial improvement.  Home Medications Prior to Admission medications   Medication Sig Start Date End Date Taking? Authorizing Provider  albuterol (PROAIR HFA) 108 (90 Base) MCG/ACT inhaler INHALE 1-2 PUFFS INTO THE LUNGS EVERY 6 (SIX) HOURS AS NEEDED FOR WHEEZING OR SHORTNESS OF BREATH. Patient taking differently: Inhale 2 puffs into the lungs every 6 (six) hours as needed for wheezing or shortness of breath. 12/14/20  Yes Dutch Quint B, FNP  busPIRone (BUSPAR) 10 MG tablet Take 10 mg by mouth 3 (three) times daily. 08/11/21  Yes [provider]  Chlorpheniramine-Acetaminophen (CORICIDIN HBP COLD/FLU PO) Take 2 tablets by mouth daily as needed (Cold and cough symptoms).   Yes [provider]  diazepam (VALIUM) 10 MG tablet Take 5-10 mg by mouth every 8 (eight) hours as needed for anxiety. 08/20/21  Yes [provider]  dicyclomine (BENTYL) 10 MG capsule Take 10 mg by mouth 3 (three) times daily before meals.   Yes [provider]  ibuprofen (ADVIL) 400 MG tablet Take 1,600 mg by mouth every 6 (six) hours as needed for mild pain or moderate pain.   Yes [provider]  ipratropium (ATROVENT) 0.03  % nasal spray Place 2 sprays into both nostrils 3 (three) times daily between meals as needed for rhinitis.   Yes [provider]  losartan (COZAAR) 50 MG tablet Take 50 mg by mouth daily.   Yes [provider]  methocarbamol (ROBAXIN) 500 MG tablet Take 1 tablet by mouth every 6 (six) hours as needed for muscle spasms. 05/01/22  Yes [provider]  methylphenidate 18 MG PO CR tablet Take 18 mg by mouth daily. 04/15/22  Yes [provider]  montelukast (SINGULAIR) 10 MG tablet Take 10 mg by mouth at bedtime. 12/15/20  Yes [provider]  nicotine (NICODERM CQ - DOSED IN MG/24 HOURS) 21 mg/24hr patch Place 21 mg onto the skin daily as needed (for smoking cessation).   Yes [provider]  OLANZapine (ZYPREXA) 10 MG tablet Take 10 mg by mouth at bedtime. 08/13/21  Yes [provider]  pantoprazole (PROTONIX) 40 MG tablet TAKE 1 TABLET BY MOUTH EVERY DAY Patient taking differently: Take 40 mg by mouth daily before breakfast. 12/14/20  Yes Spero Geralds, MD  predniSONE (DELTASONE) 10 MG tablet Takes 6 tablets for 1 days, then 5 tablets for 1 days, then 4 tablets for 1 days, then 3 tablets for 1 days, then 2 tabs for 1 days, then 1 tab for 1 days, and then stop. 05/18/22  Yes Charlynne Cousins, MD  SYMBICORT 160-4.5 MCG/ACT inhaler Inhale 2 puffs into the lungs 2 (two)  times daily.   Yes [provider]  Tiotropium Bromide Monohydrate (SPIRIVA RESPIMAT) 2.5 MCG/ACT AERS Inhale 2 puffs into the lungs daily. 03/07/22  Yes Charlott Holler, MD  venlafaxine XR (EFFEXOR-XR) 37.5 MG 24 hr capsule Take 37.5 mg by mouth daily with breakfast.   Yes [provider]      Allergies    Alprazolam, Oxycodone, Penicillins, and Norvasc [amlodipine besylate]    Review of Systems   Review of Systems  Constitutional:  Negative for fever.  Respiratory:  Positive for cough, shortness of breath and wheezing.   Cardiovascular:  Negative  for leg swelling.  Gastrointestinal:  Negative for abdominal pain and vomiting.    Physical Exam Updated Vital Signs BP 106/78 (BP Location: Left Arm)   Pulse (!) 104   Temp (!) 97.4 F (36.3 C)   Resp 17   Ht 5\' 4"  (1.626 m)   Wt 81.6 kg   LMP 05/13/2022   SpO2 95%   BMI 30.90 kg/m  Physical Exam Vitals and nursing note reviewed.  Constitutional:      General: She is not in acute distress.    Appearance: She is well-developed. She is not ill-appearing or diaphoretic.  HENT:     Head: Normocephalic and atraumatic.  Cardiovascular:     Rate and Rhythm: Regular rhythm. Tachycardia present.     Heart sounds: Normal heart sounds.  Pulmonary:     Effort: Pulmonary effort is normal. No tachypnea, bradypnea, accessory muscle usage or respiratory distress.     Breath sounds: Wheezing (mild, diffuse) present.  Abdominal:     Palpations: Abdomen is soft.     Tenderness: There is no abdominal tenderness.  Musculoskeletal:     Right lower leg: No tenderness. No edema.     Left lower leg: No tenderness. No edema.  Skin:    General: Skin is warm and dry.  Neurological:     Mental Status: She is alert.     ED Results / Procedures / Treatments   Labs (all labs ordered are listed, but only abnormal results are displayed) Labs Reviewed  COMPREHENSIVE METABOLIC PANEL - Abnormal; Notable for the following components:      Result Value   Chloride 97 (*)    Glucose, Bld 110 (*)    Creatinine, Ser 1.02 (*)    Calcium 8.5 (*)    All other components within normal limits  CBC WITH DIFFERENTIAL/PLATELET - Abnormal; Notable for the following components:   WBC 12.2 (*)    HCT 47.8 (*)    Neutro Abs 9.1 (*)    Abs Immature Granulocytes 0.31 (*)    All other components within normal limits  D-DIMER, QUANTITATIVE - Abnormal; Notable for the following components:   D-Dimer, Quant 0.61 (*)    All other components within normal limits  BLOOD GAS, VENOUS - Abnormal; Notable for the  following components:   pCO2, Ven 69 (*)    pO2, Ven 52 (*)    Bicarbonate 41.8 (*)    Acid-Base Excess 13.5 (*)    All other components within normal limits  RESP PANEL BY RT-PCR (RSV, FLU A&B, COVID)  RVPGX2  STREP PNEUMONIAE URINARY ANTIGEN  BASIC METABOLIC PANEL  CBC  I-STAT BETA HCG BLOOD, ED ([facility], [facility], [facility] ONLY)    EKG EKG Interpretation  Date/Time:  Friday May 24 2022 12:24:04 EST Ventricular Rate:  122 PR Interval:  125 QRS Duration: 74 QT Interval:  331 QTC Calculation: 472 R Axis:  60 Text Interpretation: Sinus tachycardia Ventricular premature complex Aberrant conduction of SV complex(es) Consider right atrial enlargement Anterior infarct, old Abnrm T, consider ischemia, anterolateral lds similar to May 15 2022 Confirmed by Pricilla Loveless [phone]) on 05/24/2022 3:09:25 PM  Radiology CT Angio Chest PE W and/or Wo Contrast  Result Date: 05/24/2022 CLINICAL DATA:  Shortness of breath and wheezing. EXAM: CT ANGIOGRAPHY CHEST WITH CONTRAST TECHNIQUE: Multidetector CT imaging of the chest was performed using the standard protocol during bolus administration of intravenous contrast. Multiplanar CT image reconstructions and MIPs were obtained to evaluate the vascular anatomy. RADIATION DOSE REDUCTION: This exam was performed according to the departmental dose-optimization program which includes automated exposure control, adjustment of the mA and/or kV according to patient size and/or use of iterative reconstruction technique. CONTRAST:  22mL OMNIPAQUE IOHEXOL 350 MG/ML SOLN COMPARISON:  Chest x-ray earlier 05/24/2022. Older x-rays. Multiple CT angiograms, most recent 08/29/2021 FINDINGS: Cardiovascular: Trace pericardial fluid. The heart is nonenlarged. The thoracic aorta has a normal course and caliber. Diffuse breathing motion. This can limit evaluation of small and peripheral emboli. No large or central embolus identified. Mediastinum/Nodes: No specific abnormal lymph node  enlargement identified in the axillary region, hilum or mediastinum. Normal caliber thoracic esophagus. Lungs/Pleura: Breathing motion seen throughout the examination particularly at the lung bases. No pneumothorax or effusion. Subtle right lower lobe dependent opacity. Atelectasis versus infiltrate. Recommend follow-up. There is some mild scarring and atelectatic changes elsewhere along the lung bases including the left lower lobe, middle lobe and lingula. Upper Abdomen: In the upper abdomen the adrenal glands are preserved. There is atrophy of the right kidney. Replaced left hepatic artery. Previous cholecystectomy. Musculoskeletal: Scattered mild degenerative changes of the spine. Multilevel Schmorl's node changes. Review of the MIP images confirms the above findings. IMPRESSION: Significant breathing motion.  No large or central embolus. Electronically Signed   By: Karen Kays M.D.   On: 05/24/2022 16:14   DG Chest Portable 1 View  Result Date: 05/24/2022 CLINICAL DATA:  Shortness of breath, recent influenza EXAM: PORTABLE CHEST 1 VIEW COMPARISON:  05/15/2022 FINDINGS: Cardiomegaly. Both lungs are clear. The visualized skeletal structures are unremarkable. IMPRESSION: Cardiomegaly without acute abnormality of the lungs in AP portable projection. Electronically Signed   By: Jearld Lesch M.D.   On: 05/24/2022 13:28    Procedures Procedures    Medications Ordered in ED Medications  ipratropium-albuterol (DUONEB) 0.5-2.5 (3) MG/3ML nebulizer solution 3 mL (3 mLs Nebulization Given 05/24/22 2020)  methylPREDNISolone sodium succinate (SOLU-MEDROL) 40 mg/mL injection 40 mg (40 mg Intravenous Given 05/24/22 2032)  guaiFENesin (MUCINEX) 12 hr tablet 600 mg (600 mg Oral Given 05/24/22 2144)  0.9 %  sodium chloride infusion ( Intravenous New Bag/Given 05/24/22 1835)  nicotine (NICODERM CQ - dosed in mg/24 hr) patch 21 mg (21 mg Transdermal Patch Applied 05/24/22 1836)  busPIRone (BUSPAR) tablet 10 mg (10 mg Oral  Given 05/24/22 2143)  dicyclomine (BENTYL) capsule 10 mg (has no administration in time range)  methocarbamol (ROBAXIN) tablet 500 mg (500 mg Oral Given 05/24/22 2032)  montelukast (SINGULAIR) tablet 10 mg (10 mg Oral Given 05/24/22 2144)  OLANZapine (ZYPREXA) tablet 10 mg (10 mg Oral Given 05/24/22 2144)  pantoprazole (PROTONIX) EC tablet 40 mg (has no administration in time range)  venlafaxine XR (EFFEXOR-XR) 24 hr capsule 37.5 mg (has no administration in time range)  losartan (COZAAR) tablet 50 mg (has no administration in time range)  enoxaparin (LOVENOX) injection 40 mg (40 mg Subcutaneous Given 05/24/22 2143)  influenza vac split quadrivalent PF (FLUARIX) injection 0.5 mL (has no administration in time range)  diazepam (VALIUM) tablet 5 mg (has no administration in time range)  lactated ringers bolus 1,000 mL (1,000 mLs Intravenous New Bag/Given 05/24/22 1535)  ipratropium-albuterol (DUONEB) 0.5-2.5 (3) MG/3ML nebulizer solution 3 mL (3 mLs Nebulization Given 05/24/22 1543)  albuterol (PROVENTIL) (2.5 MG/3ML) 0.083% nebulizer solution 2.5 mg (2.5 mg Nebulization Given 05/24/22 1543)  methylPREDNISolone sodium succinate (SOLU-MEDROL) 125 mg/2 mL injection 125 mg (125 mg Intravenous Given 05/24/22 1543)  magnesium sulfate IVPB 2 g 50 mL (0 g Intravenous Stopped 05/24/22 1751)  iohexol (OMNIPAQUE) 350 MG/ML injection 75 mL (75 mLs Intravenous Contrast Given 05/24/22 1556)  albuterol (PROVENTIL) (2.5 MG/3ML) 0.083% nebulizer solution 2.5 mg (2.5 mg Nebulization Given 05/24/22 1835)  ipratropium-albuterol (DUONEB) 0.5-2.5 (3) MG/3ML nebulizer solution 3 mL (3 mLs Nebulization Given 05/24/22 1835)    ED Course/ Medical Decision Making/ A&P                           Medical Decision Making Amount and/or Complexity of Data Reviewed External Data Reviewed: notes. Labs:     Details: COVID/flu/RSV negative.  Compensated respiratory failure with a CO2 of 69 Radiology: ordered and independent interpretation  performed.    Details: CT a negative for PE though not the best quality.  No obvious pneumonia ECG/medicine tests: independent interpretation performed.    Details: Sinus tachycardia cardia without obvious ischemia  Risk Prescription drug management. Decision regarding hospitalization.   Patient presents with recurrent respiratory failure.  She is requiring at least 2 L of oxygen.  However we were unable to wean her down further and she dropped to 88% when we took her oxygen off, despite being given a DuoNeb here.  She was also given magnesium and Solu-Medrol.  No obvious infection at this time.  No PE, CT was obtained given the positive D-dimer obtained from triage.  Otherwise, I suspect some of this is at least chronic as she is not ill-appearing necessarily but she also does not have home oxygen and I think she will need admission and to be considered for discharge with home oxygen.  Discussed with Dr. Erlinda Hong for admission.        Final Clinical Impression(s) / ED Diagnoses Final diagnoses:  Acute on chronic respiratory failure with hypoxia [facility])    Rx / DC Orders ED Discharge Orders     None         Sherwood Gambler, MD 05/24/22 2348

## 2022-05-24 NOTE — H&P (Signed)
History and Physical    [name] GMW:[id number] DOB: 16-May-1981 DOA: 05/24/2022  PCP: Pablo Lawrence, NP Patient coming from: home  I have personally briefly reviewed patient's old medical records in [place]  Chief Complaint: sob  HPI: [name] is a 42 y.o. female with medical history significant for HTN, copd/asthma, GERD depression/ anxiety comes in for shortness of breath .  She was hospitalized from December 27 to December 30 for COPD asthma exacerbation due to influenza A, she finished Tamiflu treatment and steroid taper yesterday, then her cough and  wheezing got worse become more sob   ED Course:   Data reviewed: Blood pressure (!) 156/109, pulse (!) 103, temperature 99 F (37.2 C), temperature source Oral, resp. rate (!) 21, height 5\' 4"  (1.626 m), weight 81.6 kg, last menstrual period 05/13/2022, SpO2 95 %.  CTA no PE, Subtle right lower lobe dependent opacity. Atelectasis versus infiltrate. Recommend follow-up. There is some mild scarring and atelectatic changes elsewhere along the lung bases including the left lower lobe, middle lobe and lingula.  ABG: chronic CO2 retainer range from 69 to 79 with normal PH Respiratory viral panel negative for flu, RSV and COVID Negative pregnancy test  CBC with WBC 12  , creatinine 1.02 (baseline 0.89)  She was given Solu-Medrol, DuoNeb, IV mag, LR bolus 1 L, she remained tachypneic and tachycardic, O2 87 on room air, she was put on 3 L oxygen, admission requested     Review of Systems: As per HPI otherwise all other systems reviewed and are negative.   Past Medical History:  Diagnosis Date   Anxiety    Asthma    Depression    doing good, not on meds now   GERD (gastroesophageal reflux disease)    Headache    migraines   History of kidney stones    Hypertension    Kidney stones    Missed abortion 10/02/2013   Ovarian cyst    Pregnancy induced hypertension    Suicidal intent    Vaginal Pap  smear, abnormal    bx, f/u ok    Past Surgical History:  Procedure Laterality Date   CESAREAN SECTION     CESAREAN SECTION N/A 06/06/2015   Procedure: CESAREAN SECTION;  Surgeon: Frederico Hamman, MD;  Location: [facility];  Service: Obstetrics;  Laterality: N/A;   CHOLECYSTECTOMY N/A 08/23/2016   Procedure: LAPAROSCOPIC CHOLECYSTECTOMY;  Surgeon: Clovis Riley, MD;  Location: [facility];  Service: General;  Laterality: N/A;   DILATION AND CURETTAGE OF UTERUS     DILATION AND EVACUATION N/A 10/04/2013   Procedure: DILATATION AND EVACUATION;  Surgeon: Janyth Contes, MD;  Location: [facility];  Service: Gynecology;  Laterality: N/A;  [place] in room    [place] History  reports that she has been smoking cigarettes. She has a 3.75 pack-year smoking history. She has never used smokeless tobacco. She reports current alcohol use of about 4.0 standard drinks of alcohol per week. She reports that she does not use drugs.  Allergies  Allergen Reactions   Alprazolam Itching   Oxycodone Itching   Penicillins Nausea And Vomiting and Other (See Comments)    Has patient had a PCN reaction causing immediate rash, facial/tongue/throat swelling, SOB or lightheadedness with hypotension:No Has patient had a PCN reaction causing severe rash involving mucus membranes or skin necrosis:No Has patient had a PCN reaction that REACTION THAT REQUIRED HOSPITALIZATION:  #  #  #  YES  #  #  #  Has patient had a PCN reaction occurring within the last 10 years: #  #  #  YES  #  #  #    Norvasc [Amlodipine Besylate] Other (See Comments)    Tip of tongue numb and flushing    Family History  Problem Relation Age of Onset   Hearing loss Mother    Asthma Mother    Asthma Father    Diabetes Son    Cancer Maternal Grandfather        bone   Liver disease Maternal Grandfather    Asthma Sister    Lupus Sister     Prior to Admission medications   Medication Sig Start Date End Date  Taking? Authorizing Provider  albuterol (PROAIR HFA) 108 (90 Base) MCG/ACT inhaler INHALE 1-2 PUFFS INTO THE LUNGS EVERY 6 (SIX) HOURS AS NEEDED FOR WHEEZING OR SHORTNESS OF BREATH. Patient taking differently: Inhale 2 puffs into the lungs every 6 (six) hours as needed for wheezing or shortness of breath. 12/14/20   Worthy Rancher B, FNP  buPROPion [facility] SR) 150 MG 12 hr tablet Take 1 tablet (150 mg total) by mouth in the morning and at bedtime. 01/30/22   Rankin, Shuvon B, NP  busPIRone (BUSPAR) 10 MG tablet Take 10 mg by mouth 3 (three) times daily. 08/11/21   [provider]  diazepam (VALIUM) 10 MG tablet Take 5-10 mg by mouth every 8 (eight) hours as needed for anxiety. 08/20/21   [provider]  dicyclomine (BENTYL) 10 MG capsule Take 10 mg by mouth 3 (three) times daily before meals.    [provider]  ipratropium (ATROVENT) 0.03 % nasal spray Place 2 sprays into both nostrils 3 (three) times daily between meals as needed for rhinitis.    [provider]  losartan (COZAAR) 50 MG tablet Take 50 mg by mouth daily.    [provider]  methocarbamol (ROBAXIN) 500 MG tablet Take 1 tablet by mouth every 6 (six) hours as needed for muscle spasms. 05/01/22   [provider]  methylphenidate 18 MG PO CR tablet Take 18 mg by mouth daily. 04/15/22   [provider]  montelukast (SINGULAIR) 10 MG tablet Take 10 mg by mouth at bedtime. 12/15/20   [provider]  nicotine (NICODERM CQ - DOSED IN MG/24 HOURS) 21 mg/24hr patch Place 21 mg onto the skin daily as needed (for smoking cessation).    [provider]  OLANZapine (ZYPREXA) 10 MG tablet Take 10 mg by mouth at bedtime. 08/13/21   [provider]  pantoprazole (PROTONIX) 40 MG tablet TAKE 1 TABLET BY MOUTH EVERY DAY Patient taking differently: Take 40 mg by mouth daily before breakfast. 12/14/20   Charlott Holler, MD  predniSONE (DELTASONE) 10 MG tablet Takes 6  tablets for 1 days, then 5 tablets for 1 days, then 4 tablets for 1 days, then 3 tablets for 1 days, then 2 tabs for 1 days, then 1 tab for 1 days, and then stop. 05/18/22   Marinda Elk, MD  SYMBICORT 160-4.5 MCG/ACT inhaler Inhale 2 puffs into the lungs 2 (two) times daily.    [provider]  Tiotropium Bromide Monohydrate (SPIRIVA RESPIMAT) 2.5 MCG/ACT AERS Inhale 2 puffs into the lungs daily. 03/07/22   Charlott Holler, MD  venlafaxine XR (EFFEXOR-XR) 37.5 MG 24 hr capsule Take 37.5 mg by mouth daily with breakfast.    [provider]  Physical Exam: Vitals:   05/24/22 1610 05/24/22 1630 05/24/22 1740 05/24/22 1803  BP: (!) 156/109 (!) 160/95 (!) 126/95 (!) 145/107  Pulse: (!) 103 (!) 103 (!) 111 (!) 114  Resp: (!) 21  (!) 22 (!) 24  Temp:   98.5 F (36.9 C) 98.2 F (36.8 C)  TempSrc:   Oral Oral  SpO2: 95% 93% 92% (!) 87%  Weight:      Height:        Constitutional: NAD, calm, comfortable Eyes: PERRL, lids and conjunctivae normal ENMT: Mucous membranes are moist.  Respiratory: bilateral end expiratory wheezing, no crackles. Normal respiratory effort. No accessory muscle use.  Cardiovascular: Regular rate and rhythm,  No extremity edema. 2+ pedal pulses. No carotid bruits.  Abdomen: no tenderness, not distended, Bowel sounds positive.  Musculoskeletal: no clubbing / cyanosis. No joint deformity upper and lower extremities. Good ROM, no contractures. Normal muscle tone.  Skin: no rashes, lesions, ulcers. No induration Neurologic: CN 2-12 grossly intact. Sensation intact, Strength 5/5 in all 4.  Psychiatric: Normal judgment and insight. Alert and oriented x 3. Normal mood.    Labs on Admission: I have personally reviewed following labs and imaging studies  CBC: Recent Labs  Lab 05/24/22 1344  WBC 12.2*  NEUTROABS 9.1*  HGB 14.7  HCT 47.8*  MCV 99.2  PLT 343    Basic Metabolic Panel: Recent Labs  Lab 05/24/22 1344  NA 136  K 3.8  CL  97*  CO2 31  GLUCOSE 110*  BUN 9  CREATININE 1.02*  CALCIUM 8.5*    GFR: Estimated Creatinine Clearance: 76.6 mL/min (A) (by C-G formula based on SCr of 1.02 mg/dL (H)).  Liver Function Tests: Recent Labs  Lab 05/24/22 1344  AST 21  ALT 22  ALKPHOS 76  BILITOT 0.5  PROT 6.8  ALBUMIN 3.5    Urine analysis:    Component Value Date/Time   COLORURINE YELLOW 02/17/2020 1930   APPEARANCEUR HAZY (A) 02/17/2020 1930   LABSPEC 1.020 02/17/2020 1930   PHURINE 6.0 02/17/2020 1930   GLUCOSEU NEGATIVE 02/17/2020 1930   HGBUR NEGATIVE 02/17/2020 1930   BILIRUBINUR NEGATIVE 02/17/2020 1930   BILIRUBINUR negative 04/06/2018 1652   BILIRUBINUR negative 08/21/2015 1551   KETONESUR NEGATIVE 02/17/2020 1930   PROTEINUR 30 (A) 02/17/2020 1930   UROBILINOGEN 0.2 04/06/2018 1652   UROBILINOGEN 0.2 01/13/2017 1313   NITRITE NEGATIVE 02/17/2020 1930   LEUKOCYTESUR NEGATIVE 02/17/2020 1930    Radiological Exams on Admission: CT Angio Chest PE W and/or Wo Contrast  Result Date: 05/24/2022 CLINICAL DATA:  Shortness of breath and wheezing. EXAM: CT ANGIOGRAPHY CHEST WITH CONTRAST TECHNIQUE: Multidetector CT imaging of the chest was performed using the standard protocol during bolus administration of intravenous contrast. Multiplanar CT image reconstructions and MIPs were obtained to evaluate the vascular anatomy. RADIATION DOSE REDUCTION: This exam was performed according to the departmental dose-optimization program which includes automated exposure control, adjustment of the mA and/or kV according to patient size and/or use of iterative reconstruction technique. CONTRAST:  31mL OMNIPAQUE IOHEXOL 350 MG/ML SOLN COMPARISON:  Chest x-ray earlier 05/24/2022. Older x-rays. Multiple CT angiograms, most recent 08/29/2021 FINDINGS: Cardiovascular: Trace pericardial fluid. The heart is nonenlarged. The thoracic aorta has a normal course and caliber. Diffuse breathing motion. This can limit evaluation of  small and peripheral emboli. No large or central embolus identified. Mediastinum/Nodes: No specific abnormal lymph node enlargement identified in the axillary region, hilum or mediastinum. Normal caliber thoracic esophagus. Lungs/Pleura: Breathing motion seen  throughout the examination particularly at the lung bases. No pneumothorax or effusion. Subtle right lower lobe dependent opacity. Atelectasis versus infiltrate. Recommend follow-up. There is some mild scarring and atelectatic changes elsewhere along the lung bases including the left lower lobe, middle lobe and lingula. Upper Abdomen: In the upper abdomen the adrenal glands are preserved. There is atrophy of the right kidney. Replaced left hepatic artery. Previous cholecystectomy. Musculoskeletal: Scattered mild degenerative changes of the spine. Multilevel Schmorl's node changes. Review of the MIP images confirms the above findings. IMPRESSION: Significant breathing motion.  No large or central embolus. Electronically Signed   By: Jill Side M.D.   On: 05/24/2022 16:14   DG Chest Portable 1 View  Result Date: 05/24/2022 CLINICAL DATA:  Shortness of breath, recent influenza EXAM: PORTABLE CHEST 1 VIEW COMPARISON:  05/15/2022 FINDINGS: Cardiomegaly. Both lungs are clear. The visualized skeletal structures are unremarkable. IMPRESSION: Cardiomegaly without acute abnormality of the lungs in AP portable projection. Electronically Signed   By: Delanna Ahmadi M.D.   On: 05/24/2022 13:28    EKG: Independently reviewed.   Assessment/Plan Principal Problem:   Asthma exacerbation in COPD [facility])    Asthma/COPD exacerbation/acute hypoxic respiratory failure  Chronic co2 retainer Steroids ,duoneb, check urine strep May need home o2  Mild leukocytosis, mild elevation of cr Possible dehydration, start ns at 75cc/hr, encourage oral intake Repeat lab in am  HTN Continue home meds losartan    Trace pericardial fluid  on CTA chest Denies chest pain F/u  with pcp  Depression /anxiety Continue home meds  Cigarette smoking Smoking cessation education provided, agreed with nicotine patch, ordered     DVT prophylaxis: lovenox   Code Status:   full Family Communication:    Patient is from: home   Anticipated DC to: home   Anticipated DC date: Possible on 1/6, need home o2 eval    Consults called:  none Admission status:  obs   Voice Recognition /Dragon dictation system was used to create this note, attempts have been made to correct errors. Please contact the author with questions and/or clarifications.  Florencia Reasons MD PhD FACP [facility]  How to contact the [facility] Attending or Consulting provider [place] or covering provider during after hours [employer], for this patient?   Check the care team in [facility] and look for a) attending/consulting [facility] provider listed and b) the [facility] team listed Log into [email or web address] and use [place]'s universal password to access. If you do not have the password, please contact the hospital operator. Locate the [facility] provider you are looking for under [facility] and page to a number that you can be directly reached. If you still have difficulty reaching the provider, please page the [facility] (Director on Call) for the Hospitalists listed on amion for assistance.  05/24/2022, 6:21 PM

## 2022-05-25 DIAGNOSIS — Z7951 Long term (current) use of inhaled steroids: Secondary | ICD-10-CM | POA: Diagnosis not present

## 2022-05-25 DIAGNOSIS — Z825 Family history of asthma and other chronic lower respiratory diseases: Secondary | ICD-10-CM | POA: Diagnosis not present

## 2022-05-25 DIAGNOSIS — Z20822 Contact with and (suspected) exposure to covid-19: Secondary | ICD-10-CM | POA: Diagnosis present

## 2022-05-25 DIAGNOSIS — I10 Essential (primary) hypertension: Secondary | ICD-10-CM | POA: Diagnosis present

## 2022-05-25 DIAGNOSIS — E860 Dehydration: Secondary | ICD-10-CM | POA: Diagnosis present

## 2022-05-25 DIAGNOSIS — F419 Anxiety disorder, unspecified: Secondary | ICD-10-CM | POA: Diagnosis present

## 2022-05-25 DIAGNOSIS — Z888 Allergy status to other drugs, medicaments and biological substances status: Secondary | ICD-10-CM | POA: Diagnosis not present

## 2022-05-25 DIAGNOSIS — K219 Gastro-esophageal reflux disease without esophagitis: Secondary | ICD-10-CM | POA: Diagnosis present

## 2022-05-25 DIAGNOSIS — Z885 Allergy status to narcotic agent status: Secondary | ICD-10-CM | POA: Diagnosis not present

## 2022-05-25 DIAGNOSIS — Z79899 Other long term (current) drug therapy: Secondary | ICD-10-CM | POA: Diagnosis not present

## 2022-05-25 DIAGNOSIS — Z808 Family history of malignant neoplasm of other organs or systems: Secondary | ICD-10-CM | POA: Diagnosis not present

## 2022-05-25 DIAGNOSIS — R0602 Shortness of breath: Secondary | ICD-10-CM | POA: Diagnosis not present

## 2022-05-25 DIAGNOSIS — Z832 Family history of diseases of the blood and blood-forming organs and certain disorders involving the immune mechanism: Secondary | ICD-10-CM | POA: Diagnosis not present

## 2022-05-25 DIAGNOSIS — Z880 Allergy status to penicillin: Secondary | ICD-10-CM | POA: Diagnosis not present

## 2022-05-25 DIAGNOSIS — F32A Depression, unspecified: Secondary | ICD-10-CM | POA: Diagnosis present

## 2022-05-25 DIAGNOSIS — Z23 Encounter for immunization: Secondary | ICD-10-CM | POA: Diagnosis not present

## 2022-05-25 DIAGNOSIS — Z822 Family history of deafness and hearing loss: Secondary | ICD-10-CM | POA: Diagnosis not present

## 2022-05-25 DIAGNOSIS — F17210 Nicotine dependence, cigarettes, uncomplicated: Secondary | ICD-10-CM | POA: Diagnosis present

## 2022-05-25 DIAGNOSIS — J4489 Other specified chronic obstructive pulmonary disease: Secondary | ICD-10-CM | POA: Diagnosis present

## 2022-05-25 DIAGNOSIS — J441 Chronic obstructive pulmonary disease with (acute) exacerbation: Secondary | ICD-10-CM | POA: Diagnosis not present

## 2022-05-25 DIAGNOSIS — Z6830 Body mass index (BMI) 30.0-30.9, adult: Secondary | ICD-10-CM | POA: Diagnosis not present

## 2022-05-25 DIAGNOSIS — Z833 Family history of diabetes mellitus: Secondary | ICD-10-CM | POA: Diagnosis not present

## 2022-05-25 DIAGNOSIS — J45901 Unspecified asthma with (acute) exacerbation: Secondary | ICD-10-CM | POA: Diagnosis not present

## 2022-05-25 DIAGNOSIS — I3139 Other pericardial effusion (noninflammatory): Secondary | ICD-10-CM | POA: Diagnosis present

## 2022-05-25 DIAGNOSIS — E669 Obesity, unspecified: Secondary | ICD-10-CM | POA: Diagnosis present

## 2022-05-25 LAB — BASIC METABOLIC PANEL
Anion gap: 8 (ref 5–15)
BUN: 13 mg/dL (ref 6–20)
CO2: 34 mmol/L — ABNORMAL HIGH (ref 22–32)
Calcium: 8.5 mg/dL — ABNORMAL LOW (ref 8.9–10.3)
Chloride: 99 mmol/L (ref 98–111)
Creatinine, Ser: 0.97 mg/dL (ref 0.44–1.00)
GFR, Estimated: >60 mL/min (ref >60)
Glucose, Bld: 163 mg/dL — ABNORMAL HIGH (ref 70–99)
Potassium: 4.6 mmol/L (ref 3.5–5.1)
Sodium: 141 mmol/L (ref 135–145)

## 2022-05-25 LAB — CBC
HCT: 49.1 % — ABNORMAL HIGH (ref 36.0–46.0)
Hemoglobin: 14.8 g/dL (ref 12.0–15.0)
MCH: 30.5 pg (ref 26.0–34.0)
MCHC: 30.1 g/dL (ref 30.0–36.0)
MCV: 101.2 fL — ABNORMAL HIGH (ref 80.0–100.0)
Platelets: 359 10*3/uL (ref 150–400)
RBC: 4.85 MIL/uL (ref 3.87–5.11)
RDW: 13.9 % (ref 11.5–15.5)
WBC: 13.6 10*3/uL — ABNORMAL HIGH (ref 4.0–10.5)
nRBC: 0.1 % (ref 0.0–0.2)

## 2022-05-25 LAB — RAPID URINE DRUG SCREEN, HOSP PERFORMED
Amphetamines: NOT DETECTED
Barbiturates: NOT DETECTED
Benzodiazepines: POSITIVE — AB
Cocaine: NOT DETECTED
Opiates: NOT DETECTED
Tetrahydrocannabinol: NOT DETECTED

## 2022-05-25 LAB — STREP PNEUMONIAE URINARY ANTIGEN: Strep Pneumo Urinary Antigen: NEGATIVE

## 2022-05-25 MED ORDER — METHYLPREDNISOLONE SODIUM SUCC 125 MG IJ SOLR
80.0000 mg | Freq: Two times a day (BID) | INTRAMUSCULAR | Status: DC
  Administered 2022-05-25 – 2022-05-27 (×4): 80 mg via INTRAVENOUS
  Filled 2022-05-25 (×4): qty 2

## 2022-05-25 MED ORDER — DOCUSATE SODIUM 50 MG PO CAPS
50.0000 mg | ORAL_CAPSULE | Freq: Once | ORAL | Status: AC | PRN
  Administered 2022-05-25: 50 mg via ORAL
  Filled 2022-05-25: qty 1

## 2022-05-25 MED ORDER — ALBUTEROL SULFATE HFA 108 (90 BASE) MCG/ACT IN AERS: 1.0000 | INHALATION_SPRAY | Freq: Four times a day (QID) | RESPIRATORY_TRACT | Status: DC | PRN

## 2022-05-25 MED ORDER — ALBUTEROL SULFATE (2.5 MG/3ML) 0.083% IN NEBU
2.5000 mg | INHALATION_SOLUTION | RESPIRATORY_TRACT | Status: DC | PRN
  Administered 2022-05-25 – 2022-05-26 (×3): 2.5 mg via RESPIRATORY_TRACT
  Filled 2022-05-25 (×2): qty 3

## 2022-05-25 MED ORDER — MAGNESIUM SULFATE 2 GM/50ML IV SOLN
2.0000 g | Freq: Once | INTRAVENOUS | Status: AC
  Administered 2022-05-25: 2 g via INTRAVENOUS
  Filled 2022-05-25: qty 50

## 2022-05-25 MED ORDER — MOMETASONE FURO-FORMOTEROL FUM 100-5 MCG/ACT IN AERO
2.0000 | INHALATION_SPRAY | Freq: Two times a day (BID) | RESPIRATORY_TRACT | Status: DC
  Administered 2022-05-25 – 2022-05-28 (×6): 2 via RESPIRATORY_TRACT
  Filled 2022-05-25: qty 8.8

## 2022-05-25 MED ORDER — AZITHROMYCIN 250 MG PO TABS
500.0000 mg | ORAL_TABLET | Freq: Every day | ORAL | Status: DC
  Administered 2022-05-25 – 2022-05-28 (×4): 500 mg via ORAL
  Filled 2022-05-25 (×4): qty 2

## 2022-05-25 NOTE — Progress Notes (Signed)
Patient had gotten up to go to the bathrooom. She began having increased SOB and WOB. RT gave duoneb nebulizer treatment. Patient stated she didn't feel relief and was still unable to catch her breath. RT gave patient prn albuterol. Patient vitals were hr119  rr22 Spo2 95% 2 L . Patient will have continued care by RT

## 2022-05-25 NOTE — Progress Notes (Signed)
SATURATION QUALIFICATIONS: (This note is used to comply with regulatory documentation for home oxygen)  Patient Saturations on Room Air at Rest = 86%  Patient Saturations on Room Air while Ambulating = 80%  Patient Saturations on 4 Liters of oxygen while Ambulating = 91%  Please briefly explain why patient needs home oxygen: Patients oxygen saturation drops without use of oxygen  Dewayne Hatch, RN

## 2022-05-25 NOTE — Progress Notes (Signed)
PROGRESS NOTE    [name]  ZOX:[id number] DOB: [birth year] DOA: 05/24/2022 PCP: Pablo Lawrence, NP     Brief Narrative:   HTN, copd/asthma, GERD depression/ anxiety comes in for shortness of breath .  She was hospitalized from December 27 to December 30 for COPD asthma exacerbation due to influenza A, she finished Tamiflu treatment and steroid taper yesterday, then her cough and  wheezing got worse become more sob   Subjective:  Continue to wheeze, o2 dropped to 80% on room air  Assessment & Plan:  Principal Problem:   Asthma exacerbation in COPD [facility]) Active Problems:   Hypertension   Depression with anxiety   Smoking   Acute respiratory failure ([place])    Assessment and Plan:  Asthma/COPD exacerbation/acute hypoxic respiratory failure /Chronic co2 retainer -Subtle right lower lobe dependent opacity. Atelectasis versus infiltrate. Urine strep pneumo negative , wheezing appear getting worse today Increase steroids, add iv mag, Add zithromax Likely will  need home o2   Leukocytosis  Likely multifactorial, from stress ,dehydration, and steroids  mild elevation of cr Possible dehydration, cr improved on hydartion, encourage oral intake Repeat lab in am   HTN Continue home meds losartan      Trace pericardial fluid  on CTA chest Denies chest pain F/u with pcp   Depression /anxiety Continue home meds   Cigarette smoking Smoking cessation education provided, agreed with nicotine patch, ordered     : Body mass index is 30.9 kg/m.Marland Kitchen Meet obesity criteria       .  I have Reviewed nursing notes, Vitals, pain scores, I/o's, Lab results and  imaging results since pt's last encounter, details please see discussion above  I ordered the following labs:  Unresulted Labs (From admission, onward)     Start     Ordered   05/31/22 0500  Creatinine, serum  (enoxaparin (LOVENOX)    CrCl >/= 30 ml/min)  Weekly,   R     Comments: while on enoxaparin therapy     05/24/22 1900   05/26/22 0500  CBC with Differential/Platelet  Tomorrow morning,   R        05/25/22 1637   05/26/22 8119  Basic metabolic panel  Tomorrow morning,   R        05/25/22 1637   05/26/22 0500  Magnesium  Tomorrow morning,   R        05/25/22 1637   05/25/22 1502  Rapid urine drug screen (hospital performed)  ONCE - STAT,   STAT        05/25/22 1501             DVT prophylaxis: enoxaparin (LOVENOX) injection 40 mg Start: 05/24/22 2200   Code Status:   Code Status: Full Code  Family Communication: none at bedside  Disposition:   Dispo: The patient is from: home               Anticipated d/c is to: home               Anticipated d/c date is: pending clinical improvement  Antimicrobials:    Anti-infectives (From admission, onward)    Start     Dose/Rate Route Frequency Ordered Stop   05/25/22 1545  azithromycin (ZITHROMAX) tablet 500 mg        500 mg Oral Daily 05/25/22 1459            Objective: Vitals:   05/25/22 1240 05/25/22 1249 05/25/22 1338 05/25/22 1532  BP:   Marland Kitchen)  141/96 (!) 148/105  Pulse:   (!) 119 (!) 119  Resp:   20 (!) 22  Temp:   98.3 F (36.8 C) 98.5 F (36.9 C)  TempSrc:    Oral  SpO2: 96% 98% 90% 92%  Weight:      Height:        Intake/Output Summary (Last 24 hours) at 05/25/2022 1637 Last data filed at 05/25/2022 1618 Gross per 24 hour  Intake 1019 ml  Output --  Net 1019 ml   Filed Weights   05/24/22 1305  Weight: 81.6 kg    Examination:  General exam: alert, awake, communicative,calm, NAD Respiratory system: diffuse bilateral wheezing . Respiratory effort normal. Cardiovascular system:  RRR.  Gastrointestinal system: Abdomen is nondistended, soft and nontender.  Normal bowel sounds heard. Central nervous system: Alert and oriented. No focal neurological deficits. Extremities:  no edema Skin: No rashes, lesions or ulcers Psychiatry: anxious     Data Reviewed: I have personally reviewed  labs and visualized   imaging studies since the last encounter and formulate the plan        Scheduled Meds:  azithromycin  500 mg Oral Daily   busPIRone  10 mg Oral TID   dicyclomine  10 mg Oral TID AC   enoxaparin (LOVENOX) injection  40 mg Subcutaneous Q24H   guaiFENesin  600 mg Oral BID   ipratropium-albuterol  3 mL Nebulization Q4H   losartan  50 mg Oral Daily   methylPREDNISolone (SOLU-MEDROL) injection  80 mg Intravenous Q12H   mometasone-formoterol  2 puff Inhalation BID   montelukast  10 mg Oral QHS   nicotine  21 mg Transdermal Daily   OLANZapine  10 mg Oral QHS   pantoprazole  40 mg Oral QAC breakfast   venlafaxine XR  37.5 mg Oral Daily   Continuous Infusions:  sodium chloride 75 mL/hr at 05/25/22 1618   magnesium sulfate bolus IVPB 2 g (05/25/22 1606)     LOS: 0 days   Time spent:  Albertine Grates, MD PhD FACP [facility]  Available via Epic secure chat 7am-7pm for nonurgent issues Please page for urgent issues To page the attending provider between 7A-7P or the covering provider during after hours 7P-7A, please log into the web site [email or web address] and access using universal [place] password for that web site. If you do not have the password, please call the hospital operator.    05/25/2022, 4:37 PM

## 2022-05-25 NOTE — Progress Notes (Signed)
At 1445 patient called for nurse, she stated she needed something for anxiety as her valium was not helping. Writer did not give patient any valium. Patient was asked how and when she took valium. She pulled out a personal bottle of valium and said she took 10 mg about two hours ago. Patient HR 120s, currently on 4 liters of oxygen at 91%. Provider was notified.   Yesterday on admission patient was asked if she had any home medications with her, she stated no. Patient had a visitor come this morning with a big bag of belongings and a "gift" for the patient. Provider is aware. Urine drug screen sent down to lab.   Dewayne Hatch, RN

## 2022-05-26 ENCOUNTER — Inpatient Hospital Stay (HOSPITAL_COMMUNITY): Payer: Medicaid Other

## 2022-05-26 DIAGNOSIS — J441 Chronic obstructive pulmonary disease with (acute) exacerbation: Secondary | ICD-10-CM | POA: Diagnosis not present

## 2022-05-26 DIAGNOSIS — J45901 Unspecified asthma with (acute) exacerbation: Secondary | ICD-10-CM | POA: Diagnosis not present

## 2022-05-26 DIAGNOSIS — R0602 Shortness of breath: Secondary | ICD-10-CM

## 2022-05-26 LAB — BASIC METABOLIC PANEL
Anion gap: 6 (ref 5–15)
BUN: 17 mg/dL (ref 6–20)
CO2: 33 mmol/L — ABNORMAL HIGH (ref 22–32)
Calcium: 7.9 mg/dL — ABNORMAL LOW (ref 8.9–10.3)
Chloride: 101 mmol/L (ref 98–111)
Creatinine, Ser: 0.95 mg/dL (ref 0.44–1.00)
GFR, Estimated: >60 mL/min (ref >60)
Glucose, Bld: 161 mg/dL — ABNORMAL HIGH (ref 70–99)
Potassium: 4.5 mmol/L (ref 3.5–5.1)
Sodium: 140 mmol/L (ref 135–145)

## 2022-05-26 LAB — ECHOCARDIOGRAM COMPLETE
Area-P 1/2: 4.13 cm2
Height: 64 in
S' Lateral: 2.7 cm
Weight: 2880 oz

## 2022-05-26 LAB — CBC WITH DIFFERENTIAL/PLATELET
Abs Immature Granulocytes: 0.48 10*3/uL — ABNORMAL HIGH (ref 0.00–0.07)
Basophils Absolute: 0.1 10*3/uL (ref 0.0–0.1)
Basophils Relative: 0 %
Eosinophils Absolute: 0 10*3/uL (ref 0.0–0.5)
Eosinophils Relative: 0 %
HCT: 41.8 % (ref 36.0–46.0)
Hemoglobin: 12.8 g/dL (ref 12.0–15.0)
Immature Granulocytes: 3 %
Lymphocytes Relative: 5 %
Lymphs Abs: 0.7 10*3/uL (ref 0.7–4.0)
MCH: 30.8 pg (ref 26.0–34.0)
MCHC: 30.6 g/dL (ref 30.0–36.0)
MCV: 100.5 fL — ABNORMAL HIGH (ref 80.0–100.0)
Monocytes Absolute: 0.4 10*3/uL (ref 0.1–1.0)
Monocytes Relative: 3 %
Neutro Abs: 14.2 10*3/uL — ABNORMAL HIGH (ref 1.7–7.7)
Neutrophils Relative %: 89 %
Platelets: 345 10*3/uL (ref 150–400)
RBC: 4.16 MIL/uL (ref 3.87–5.11)
RDW: 13.9 % (ref 11.5–15.5)
WBC: 15.9 10*3/uL — ABNORMAL HIGH (ref 4.0–10.5)
nRBC: 0 % (ref 0.0–0.2)

## 2022-05-26 LAB — MAGNESIUM: Magnesium: 2.5 mg/dL — ABNORMAL HIGH (ref 1.7–2.4)

## 2022-05-26 MED ORDER — IPRATROPIUM-ALBUTEROL 0.5-2.5 (3) MG/3ML IN SOLN
3.0000 mL | RESPIRATORY_TRACT | Status: DC
  Administered 2022-05-26 – 2022-05-28 (×11): 3 mL via RESPIRATORY_TRACT
  Filled 2022-05-26 (×11): qty 3

## 2022-05-26 MED ORDER — IPRATROPIUM-ALBUTEROL 0.5-2.5 (3) MG/3ML IN SOLN: 3.0000 mL | Freq: Four times a day (QID) | RESPIRATORY_TRACT | Status: DC

## 2022-05-26 MED ORDER — IBUPROFEN 200 MG PO TABS
200.0000 mg | ORAL_TABLET | Freq: Once | ORAL | Status: AC | PRN
  Administered 2022-05-26: 200 mg via ORAL
  Filled 2022-05-26: qty 1

## 2022-05-26 NOTE — TOC Progression Note (Signed)
Transition of Care [facility]) - Progression Note    Patient Details  Name: [name] MRN: [id number] Date of Birth: 01-03-1981  Transition of Care [facility]) CM/SW Contact  Servando Snare, [place] Phone Number: 05/26/2022, 12:35 PM  Clinical Narrative:      Transition of Care [facility]) Screening Note   Patient Details  Name: [name] Date of Birth: 09/18/1980   Transition of Care [facility]) CM/SW Contact:    Servando Snare, LCSW Phone Number: 05/26/2022, 12:35 PM    Transition of Care Department [facility]) has reviewed patient and no TOC needs have been identified at this time. We will continue to monitor patient advancement through interdisciplinary progression rounds. If new patient transition needs arise, please place a TOC consult.         Expected Discharge Plan and Services                                               Social Determinants of Health (SDOH) Interventions SDOH Screenings   Food Insecurity: No Food Insecurity (05/24/2022)  Housing: Low Risk  (05/24/2022)  Transportation Needs: No Transportation Needs (05/24/2022)  Utilities: Not At Risk (05/24/2022)  Alcohol Screen: Low Risk  (07/20/2017)  Depression (PHQ2-9): Medium Risk (01/30/2022)  Tobacco Use: High Risk (05/24/2022)    Readmission Risk Interventions    05/17/2022   12:29 PM 08/31/2021    1:52 PM  Readmission Risk Prevention Plan  Post Dischage Appt  Complete  Medication Screening  Complete  Transportation Screening Complete Complete  PCP or Specialist Appt within 5-7 Days Complete   Home Care Screening Complete   Medication Review (RN CM) Complete

## 2022-05-26 NOTE — Progress Notes (Signed)
  Echocardiogram 2D Echocardiogram has been performed.  [name] 05/26/2022, 3:53 PM

## 2022-05-26 NOTE — Progress Notes (Signed)
PROGRESS NOTE    [name]  HCW:[id number] DOB: 1981/04/20 DOA: 05/24/2022 PCP: Roe Rutherford, NP     Brief Narrative:   HTN, copd/asthma, GERD depression/ anxiety comes in for shortness of breath .  She was hospitalized from December 27 to December 30 for COPD asthma exacerbation due to influenza A, she finished Tamiflu treatment and steroid taper yesterday, then her cough and  wheezing got worse become more sob   Subjective:  Continue to wheeze, o2 dropped to 80% on room air  Assessment & Plan:  Principal Problem:   Asthma exacerbation in COPD [facility]) Active Problems:   Hypertension   Depression with anxiety   Smoking   Acute respiratory failure (HCC)    Assessment and Plan:  Asthma/COPD exacerbation/acute hypoxic respiratory failure /Chronic co2 retainer -Subtle right lower lobe dependent opacity. Atelectasis versus infiltrate. Urine strep pneumo negative ,  -persistent wheezing, continue IV steroids and zithromax -Likely will  need home o2   Leukocytosis  Likely multifactorial, from stress ,dehydration, and steroids  mild elevation of cr Possible dehydration, cr improved on hydartion, encourage oral intake Repeat lab in am   HTN Continue home meds losartan      Trace pericardial fluid  on CTA chest Denies chest pain Family desires echocardiogram as she has troponin elevation during recent hospitalization, order placed   F/u with pcp   Depression /anxiety/reports chronic memory issues ( reports pcp is aware her memory issues)  Continue home meds   Cigarette smoking Smoking cessation education provided, agreed with nicotine patch, ordered     : Body mass index is 30.9 kg/m.Marland Kitchen Meet obesity criteria       .  I have Reviewed nursing notes, Vitals, pain scores, I/o's, Lab results and  imaging results since pt's last encounter, details please see discussion above  I ordered the following labs:  Unresulted Labs (From admission, onward)      Start     Ordered   05/31/22 0500  Creatinine, serum  (enoxaparin (LOVENOX)    CrCl >/= 30 ml/min)  Weekly,   R     Comments: while on enoxaparin therapy    05/24/22 1900   05/27/22 0500  CBC with Differential/Platelet  Tomorrow morning,   R        05/26/22 0956   05/27/22 0500  Basic metabolic panel  Tomorrow morning,   R        05/26/22 0956   05/27/22 0500  Phosphorus  Tomorrow morning,   R        05/26/22 0957             DVT prophylaxis: enoxaparin (LOVENOX) injection 40 mg Start: 05/24/22 2200   Code Status:   Code Status: Full Code  Family Communication: stepmother and father over the phone with permission  Disposition:   Dispo: The patient is from: home               Anticipated d/c is to: home , likely will need home 02              Anticipated d/c date is: pending clinical improvement  Antimicrobials:    Anti-infectives (From admission, onward)    Start     Dose/Rate Route Frequency Ordered Stop   05/25/22 1545  azithromycin (ZITHROMAX) tablet 500 mg        500 mg Oral Daily 05/25/22 1459            Objective: Vitals:   05/26/22 0007 05/26/22 0526  05/26/22 0548 05/26/22 0822  BP: (!) 144/93 (!) 144/102    Pulse: (!) 102 100    Resp: 18 18    Temp: 97.8 F (36.6 C) 97.6 F (36.4 C)    TempSrc: Oral     SpO2: 91% 95% 92% 94%  Weight:      Height:        Intake/Output Summary (Last 24 hours) at 05/26/2022 0957 Last data filed at 05/25/2022 1700 Gross per 24 hour  Intake 1193.31 ml  Output --  Net 1193.31 ml   Filed Weights   05/24/22 1305  Weight: 81.6 kg    Examination:  General exam: alert, awake, communicative,calm, NAD Respiratory system: diffuse bilateral wheezing . Respiratory effort normal. Cardiovascular system:  RRR.  Gastrointestinal system: Abdomen is nondistended, soft and nontender.  Normal bowel sounds heard. Central nervous system: Alert and oriented. No focal neurological deficits. Extremities:  no edema Skin: No  rashes, lesions or ulcers Psychiatry: anxious     Data Reviewed: I have personally reviewed  labs and visualized  imaging studies since the last encounter and formulate the plan        Scheduled Meds:  azithromycin  500 mg Oral Daily   busPIRone  10 mg Oral TID   dicyclomine  10 mg Oral TID AC   enoxaparin (LOVENOX) injection  40 mg Subcutaneous Q24H   guaiFENesin  600 mg Oral BID   ipratropium-albuterol  3 mL Nebulization Q4H   losartan  50 mg Oral Daily   methylPREDNISolone (SOLU-MEDROL) injection  80 mg Intravenous Q12H   mometasone-formoterol  2 puff Inhalation BID   montelukast  10 mg Oral QHS   nicotine  21 mg Transdermal Daily   OLANZapine  10 mg Oral QHS   pantoprazole  40 mg Oral QAC breakfast   venlafaxine XR  37.5 mg Oral Daily   Continuous Infusions:     LOS: 1 day   Time spent: 40mins, updated family addressed several concerns   Florencia Reasons, MD PhD FACP [facility]  Available via Epic secure chat 7am-7pm for nonurgent issues Please page for urgent issues To page the attending provider between 7A-7P or the covering provider during after hours 7P-7A, please log into the web site [email or web address] and access using universal [place] password for that web site. If you do not have the password, please call the hospital operator.    05/26/2022, 9:57 AM

## 2022-05-26 NOTE — Progress Notes (Signed)
RT note: RT in to see pt. for scheduled Q4 Duoneb aerosol, lying Prone with Oxygen noted to be off, up to use RR with scheduled tx. given thereafter, replaced 2 lpm n/c.

## 2022-05-27 DIAGNOSIS — J45901 Unspecified asthma with (acute) exacerbation: Secondary | ICD-10-CM | POA: Diagnosis not present

## 2022-05-27 DIAGNOSIS — J441 Chronic obstructive pulmonary disease with (acute) exacerbation: Secondary | ICD-10-CM | POA: Diagnosis not present

## 2022-05-27 LAB — CBC WITH DIFFERENTIAL/PLATELET
Abs Immature Granulocytes: 0.54 10*3/uL — ABNORMAL HIGH (ref 0.00–0.07)
Basophils Absolute: 0.1 10*3/uL (ref 0.0–0.1)
Basophils Relative: 1 %
Eosinophils Absolute: 0 10*3/uL (ref 0.0–0.5)
Eosinophils Relative: 0 %
HCT: 43.5 % (ref 36.0–46.0)
Hemoglobin: 13.4 g/dL (ref 12.0–15.0)
Immature Granulocytes: 3 %
Lymphocytes Relative: 5 %
Lymphs Abs: 0.8 10*3/uL (ref 0.7–4.0)
MCH: 31.1 pg (ref 26.0–34.0)
MCHC: 30.8 g/dL (ref 30.0–36.0)
MCV: 100.9 fL — ABNORMAL HIGH (ref 80.0–100.0)
Monocytes Absolute: 0.4 10*3/uL (ref 0.1–1.0)
Monocytes Relative: 2 %
Neutro Abs: 15.6 10*3/uL — ABNORMAL HIGH (ref 1.7–7.7)
Neutrophils Relative %: 89 %
Platelets: 365 10*3/uL (ref 150–400)
RBC: 4.31 MIL/uL (ref 3.87–5.11)
RDW: 14.1 % (ref 11.5–15.5)
WBC: 17.5 10*3/uL — ABNORMAL HIGH (ref 4.0–10.5)
nRBC: 0 % (ref 0.0–0.2)

## 2022-05-27 LAB — BASIC METABOLIC PANEL
Anion gap: 8 (ref 5–15)
BUN: 21 mg/dL — ABNORMAL HIGH (ref 6–20)
CO2: 32 mmol/L (ref 22–32)
Calcium: 8.7 mg/dL — ABNORMAL LOW (ref 8.9–10.3)
Chloride: 99 mmol/L (ref 98–111)
Creatinine, Ser: 0.93 mg/dL (ref 0.44–1.00)
GFR, Estimated: >60 mL/min (ref >60)
Glucose, Bld: 150 mg/dL — ABNORMAL HIGH (ref 70–99)
Potassium: 4.9 mmol/L (ref 3.5–5.1)
Sodium: 139 mmol/L (ref 135–145)

## 2022-05-27 LAB — PHOSPHORUS: Phosphorus: 4.4 mg/dL (ref 2.5–4.6)

## 2022-05-27 MED ORDER — PREDNISONE 20 MG PO TABS
80.0000 mg | ORAL_TABLET | Freq: Every day | ORAL | Status: DC
  Administered 2022-05-28: 80 mg via ORAL
  Filled 2022-05-27: qty 4

## 2022-05-27 MED ORDER — SODIUM CHLORIDE 0.9 % IV SOLN: INTRAVENOUS | Status: DC

## 2022-05-27 MED ORDER — METHYLPREDNISOLONE SODIUM SUCC 125 MG IJ SOLR
80.0000 mg | Freq: Two times a day (BID) | INTRAMUSCULAR | Status: AC
  Administered 2022-05-27: 80 mg via INTRAVENOUS
  Filled 2022-05-27: qty 2

## 2022-05-27 MED ORDER — IBUPROFEN 200 MG PO TABS
200.0000 mg | ORAL_TABLET | Freq: Four times a day (QID) | ORAL | Status: DC | PRN
  Administered 2022-05-27: 200 mg via ORAL
  Filled 2022-05-27: qty 1

## 2022-05-27 NOTE — Plan of Care (Signed)
  Problem: Education: Goal: Knowledge of General Education information will improve Description: Including pain rating scale, medication(s)/side effects and non-pharmacologic comfort measures Outcome: Progressing   Problem: Health Behavior/Discharge Planning: Goal: Ability to manage health-related needs will improve Outcome: Progressing   Problem: Clinical Measurements: Goal: Ability to maintain clinical measurements within normal limits will improve Outcome: Progressing Goal: Will remain free from infection Outcome: Progressing Goal: Respiratory complications will improve Outcome: Progressing   Problem: Coping: Goal: Level of anxiety will decrease Outcome: Progressing   Problem: Elimination: Goal: Will not experience complications related to bowel motility Outcome: Progressing Goal: Will not experience complications related to urinary retention Outcome: Progressing   Problem: Pain Managment: Goal: General experience of comfort will improve Outcome: Progressing   Problem: Safety: Goal: Ability to remain free from injury will improve Outcome: Progressing   Problem: Skin Integrity: Goal: Risk for impaired skin integrity will decrease Outcome: Progressing

## 2022-05-27 NOTE — Progress Notes (Signed)
PROGRESS NOTE    [name]  WJX:[id number] DOB: 1981/05/17 DOA: 05/24/2022 PCP: Pablo Lawrence, NP     Brief Narrative:   HTN, copd/asthma, GERD depression/ anxiety comes in for shortness of breath .  She was hospitalized from December 27 to December 30 for COPD asthma exacerbation due to influenza A, she finished Tamiflu treatment and steroid taper yesterday, then her cough and  wheezing got worse become more sob   Subjective:  less wheeze, no cough, no fever,    Assessment & Plan:  Principal Problem:   Asthma exacerbation in COPD ([place]) Active Problems:   Hypertension   Depression with anxiety   Smoking   Acute respiratory failure (HCC)    Assessment and Plan:  Asthma/COPD exacerbation/acute hypoxic respiratory failure /Chronic co2 retainer -o2 dropped to 80% on room air initially  -Subtle right lower lobe dependent opacity. Atelectasis versus infiltrate. Urine strep pneumo negative , started zithromax -less wheezing on higher IV steroids 80mg  bid, start slow taper , plan for  zithromaxx5 days -need home o2 eval at discharge   Leukocytosis  Likely multifactorial, from stress ,dehydration, and steroids Add blood culture for completeness   mild elevation of cr Possible dehydration, cr improved on hydartion, encourage oral intake Repeat lab in am   HTN Continue home meds losartan      Trace pericardial fluid  on CTA chest Denies chest pain Family desires echocardiogram as she has troponin elevation during recent hospitalization, echo showed lvef 65-70% Left ventricular endocardial border not optimally defined to evaluate regional wall motion. Indeterminate diastolic filling due to E-A fusion Echo did not reports pericardial fluids F/u with pcp   Depression /anxiety/reports chronic memory issues ( reports family and  pcp are aware her memory issues)  Continue home meds   Cigarette smoking Smoking cessation education provided, agreed with nicotine  patch, ordered     : Body mass index is 30.9 kg/m.Marland Kitchen Meet obesity criteria       .  I have Reviewed nursing notes, Vitals, pain scores, I/o's, Lab results and  imaging results since pt's last encounter, details please see discussion above  I ordered the following labs:  Unresulted Labs (From admission, onward)     Start     Ordered   05/31/22 0500  Creatinine, serum  (enoxaparin (LOVENOX)    CrCl >/= 30 ml/min)  Weekly,   R     Comments: while on enoxaparin therapy    05/24/22 1900   05/28/22 0500  CBC with Differential/Platelet  Tomorrow morning,   R       Question:  Specimen collection method  Answer:  Lab=Lab collect   05/27/22 1633   05/28/22 2130  Basic metabolic panel  Tomorrow morning,   R       Question:  Specimen collection method  Answer:  Lab=Lab collect   05/27/22 1633   05/27/22 1625  Culture, blood (Routine X 2) w Reflex to ID Panel  BLOOD CULTURE X 2,   R (with TIMED occurrences)      05/27/22 1624             DVT prophylaxis: enoxaparin (LOVENOX) injection 40 mg Start: 05/24/22 2200   Code Status:   Code Status: Full Code  Family Communication: stepmother Cato Mulligan Tora Duck over the phone by patient's request   Disposition:   Dispo: The patient is from: home               Anticipated d/c is to: home , likely  will need home 02              Anticipated d/c date is: pending clinical improvement, needs slower steroids taper and outpatient follow up with pulmonology Dr Celine Mans  Antimicrobials:    Anti-infectives (From admission, onward)    Start     Dose/Rate Route Frequency Ordered Stop   05/25/22 1545  azithromycin (ZITHROMAX) tablet 500 mg        500 mg Oral Daily 05/25/22 1459            Objective: Vitals:   05/27/22 0434 05/27/22 0840 05/27/22 1000 05/27/22 1422  BP: (!) 153/111   (!) 159/112  Pulse: (!) 114   (!) 120  Resp: 18   18  Temp: 99.7 F (37.6 C)   98.9 F (37.2 C)  TempSrc: Axillary   Axillary  SpO2: 93% 94% 93% 90%   Weight:      Height:       No intake or output data in the 24 hours ending 05/27/22 1633  Filed Weights   05/24/22 1305  Weight: 81.6 kg    Examination:  General exam: alert, awake, communicative,calm, NAD Respiratory system: less bilateral wheezing . Improved air movement, prolonged expiratory phase, Respiratory effort normal. Cardiovascular system:  RRR.  Gastrointestinal system: Abdomen is nondistended, soft and nontender.  Normal bowel sounds heard. Central nervous system: Alert and oriented. No focal neurological deficits. Extremities:  no edema Skin: No rashes, lesions or ulcers Psychiatry: anxious     Data Reviewed: I have personally reviewed  labs and visualized  imaging studies since the last encounter and formulate the plan        Scheduled Meds:  azithromycin  500 mg Oral Daily   busPIRone  10 mg Oral TID   dicyclomine  10 mg Oral TID AC   enoxaparin (LOVENOX) injection  40 mg Subcutaneous Q24H   guaiFENesin  600 mg Oral BID   ipratropium-albuterol  3 mL Nebulization Q4H   losartan  50 mg Oral Daily   methylPREDNISolone (SOLU-MEDROL) injection  80 mg Intravenous Q12H   mometasone-formoterol  2 puff Inhalation BID   montelukast  10 mg Oral QHS   nicotine  21 mg Transdermal Daily   OLANZapine  10 mg Oral QHS   pantoprazole  40 mg Oral QAC breakfast   [START ON 05/28/2022] predniSONE  80 mg Oral Q breakfast   venlafaxine XR  37.5 mg Oral Daily   Continuous Infusions:  sodium chloride        LOS: 2 days   Time spent:  Albertine Grates, MD PhD FACP [facility]  Available via Epic secure chat 7am-7pm for nonurgent issues Please page for urgent issues To page the attending provider between 7A-7P or the covering provider during after hours 7P-7A, please log into the web site [email or web address] and access using universal [place] password for that web site. If you do not have the password, please call the hospital operator.    05/27/2022, 4:33 PM

## 2022-05-28 ENCOUNTER — Ambulatory Visit: Payer: Medicaid Other | Admitting: Internal Medicine

## 2022-05-28 DIAGNOSIS — J441 Chronic obstructive pulmonary disease with (acute) exacerbation: Secondary | ICD-10-CM | POA: Diagnosis not present

## 2022-05-28 DIAGNOSIS — E669 Obesity, unspecified: Secondary | ICD-10-CM | POA: Insufficient documentation

## 2022-05-28 DIAGNOSIS — I3139 Other pericardial effusion (noninflammatory): Secondary | ICD-10-CM | POA: Insufficient documentation

## 2022-05-28 DIAGNOSIS — J45901 Unspecified asthma with (acute) exacerbation: Secondary | ICD-10-CM | POA: Diagnosis not present

## 2022-05-28 LAB — BASIC METABOLIC PANEL
Anion gap: 8 (ref 5–15)
BUN: 18 mg/dL (ref 6–20)
CO2: 29 mmol/L (ref 22–32)
Calcium: 8.2 mg/dL — ABNORMAL LOW (ref 8.9–10.3)
Chloride: 100 mmol/L (ref 98–111)
Creatinine, Ser: 0.87 mg/dL (ref 0.44–1.00)
GFR, Estimated: >60 mL/min (ref >60)
Glucose, Bld: 214 mg/dL — ABNORMAL HIGH (ref 70–99)
Potassium: 4.4 mmol/L (ref 3.5–5.1)
Sodium: 137 mmol/L (ref 135–145)

## 2022-05-28 LAB — CBC WITH DIFFERENTIAL/PLATELET
Abs Immature Granulocytes: 0.42 10*3/uL — ABNORMAL HIGH (ref 0.00–0.07)
Basophils Absolute: 0.1 10*3/uL (ref 0.0–0.1)
Basophils Relative: 0 %
Eosinophils Absolute: 0 10*3/uL (ref 0.0–0.5)
Eosinophils Relative: 0 %
HCT: 40.7 % (ref 36.0–46.0)
Hemoglobin: 12.6 g/dL (ref 12.0–15.0)
Immature Granulocytes: 3 %
Lymphocytes Relative: 4 %
Lymphs Abs: 0.6 10*3/uL — ABNORMAL LOW (ref 0.7–4.0)
MCH: 30.9 pg (ref 26.0–34.0)
MCHC: 31 g/dL (ref 30.0–36.0)
MCV: 99.8 fL (ref 80.0–100.0)
Monocytes Absolute: 0.4 10*3/uL (ref 0.1–1.0)
Monocytes Relative: 3 %
Neutro Abs: 12.6 10*3/uL — ABNORMAL HIGH (ref 1.7–7.7)
Neutrophils Relative %: 90 %
Platelets: 356 10*3/uL (ref 150–400)
RBC: 4.08 MIL/uL (ref 3.87–5.11)
RDW: 14.2 % (ref 11.5–15.5)
WBC: 14.1 10*3/uL — ABNORMAL HIGH (ref 4.0–10.5)
nRBC: 0 % (ref 0.0–0.2)

## 2022-05-28 MED ORDER — SYMBICORT 160-4.5 MCG/ACT IN AERO: 2.0000 | INHALATION_SPRAY | Freq: Two times a day (BID) | RESPIRATORY_TRACT | 12 refills | Status: DC

## 2022-05-28 MED ORDER — PREDNISONE 10 MG PO TABS: ORAL_TABLET | ORAL | 0 refills | Status: DC

## 2022-05-28 MED ORDER — AZITHROMYCIN 250 MG PO TABS: 250.0000 mg | ORAL_TABLET | Freq: Every day | ORAL | 0 refills | Status: DC

## 2022-05-28 MED ORDER — IPRATROPIUM-ALBUTEROL 0.5-2.5 (3) MG/3ML IN SOLN
3.0000 mL | Freq: Three times a day (TID) | RESPIRATORY_TRACT | Status: DC
  Administered 2022-05-28: 3 mL via RESPIRATORY_TRACT
  Filled 2022-05-28: qty 3

## 2022-05-28 NOTE — Discharge Summary (Signed)
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 F with asthma/COPD, HTN, obesity DMI 30.9, depression and smoking and recent admission with flu discharged 1 week PTA who presented with shortness of breath, cough that returned.      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  Weaned off O2.  On day of discharge, able to ambulate wtihout dyspnea or hypoxia.  Given 6 days steroid taper, 3 more days antibiotics.  Has appointment with pulmonologist in 10 days.  Given refills of Symbicort.    Pericardial effusion Trace in size.  No chest pain, doubt pericarditis.  No particular follow up needed.  Depression with anxiety On Buspar, olanzapine, venlafaxine.  Smoking Smoking cessation recommended, modaliities discussed, patient is motivated to quit.  Hypertension On Losartan  Obesity (BMI 30-39.9) BMI 30.9            The [facility] Controlled Substances Registry was reviewed for this patient prior to discharge.   Consultants: None Procedures performed: None  Disposition: Home Diet recommendation:  Discharge Diet Orders (From admission, onward)     Start     Ordered   05/28/22 0000  Diet - low sodium heart healthy        05/28/22 1043             DISCHARGE MEDICATION: Allergies as of  05/28/2022       Reactions   Alprazolam Itching   Oxycodone Itching   Penicillins Nausea And Vomiting, Other (See Comments)   Has patient had a PCN reaction causing immediate rash, facial/tongue/throat swelling, SOB or lightheadedness with hypotension:No Has patient had a PCN reaction causing severe rash involving mucus membranes or skin necrosis:No Has patient had a PCN reaction that REACTION THAT REQUIRED HOSPITALIZATION:  #  #  #  YES  #  #  #  Has patient had a PCN reaction occurring within the last 10 years: #  #  #  YES  #  #  #    Norvasc [amlodipine Besylate] Other (See Comments)   Tip of tongue numb and flushing        Medication List     TAKE these medications    albuterol 108 (90 Base) MCG/ACT inhaler Commonly known as: ProAir HFA INHALE 1-2 PUFFS INTO THE LUNGS EVERY 6 (SIX) HOURS AS NEEDED FOR WHEEZING OR SHORTNESS OF BREATH. What changed: how much to take   azithromycin 250 MG tablet Commonly known as: ZITHROMAX Take 1 tablet (250 mg total) by mouth daily.   busPIRone 10 MG tablet Commonly known as: BUSPAR Take 10 mg by mouth 3 (three) times daily.   CORICIDIN HBP COLD/FLU PO Take 2 tablets by mouth daily as needed (Cold and cough symptoms).   diazepam 10 MG tablet Commonly known as: VALIUM Take 5-10  mg by mouth every 8 (eight) hours as needed for anxiety.   dicyclomine 10 MG capsule Commonly known as: BENTYL Take 10 mg by mouth 3 (three) times daily before meals.   ibuprofen 400 MG tablet Commonly known as: ADVIL Take 1,600 mg by mouth every 6 (six) hours as needed for mild pain or moderate pain.   ipratropium 0.03 % nasal spray Commonly known as: ATROVENT Place 2 sprays into both nostrils 3 (three) times daily between meals as needed for rhinitis.   losartan 50 MG tablet Commonly known as: COZAAR Take 50 mg by mouth daily.   methocarbamol 500 MG tablet Commonly known as: ROBAXIN Take 1 tablet by mouth every 6 (six) hours as needed for muscle  spasms.   methylphenidate 18 MG CR tablet Commonly known as: CONCERTA Take 18 mg by mouth daily.   montelukast 10 MG tablet Commonly known as: SINGULAIR Take 10 mg by mouth at bedtime.   nicotine 21 mg/24hr patch Commonly known as: NICODERM CQ - dosed in mg/24 hours Place 21 mg onto the skin daily as needed (for smoking cessation).   OLANZapine 10 MG tablet Commonly known as: ZYPREXA Take 10 mg by mouth at bedtime.   pantoprazole 40 MG tablet Commonly known as: PROTONIX TAKE 1 TABLET BY MOUTH EVERY DAY What changed: when to take this   predniSONE 10 MG tablet Commonly known as: DELTASONE Takes 6 tablets for 1 days, then 5 tablets for 1 days, then 4 tablets for 1 days, then 3 tablets for 1 days, then 2 tabs for 1 days, then 1 tab for 1 days, and then stop.   Spiriva Respimat 2.5 MCG/ACT Aers Generic drug: Tiotropium Bromide Monohydrate Inhale 2 puffs into the lungs daily.   Symbicort 160-4.5 MCG/ACT inhaler Generic drug: budesonide-formoterol Inhale 2 puffs into the lungs 2 (two) times daily. What changed: when to take this   venlafaxine XR 37.5 MG 24 hr capsule Commonly known as: EFFEXOR-XR Take 37.5 mg by mouth daily with breakfast.        Follow-up Information     Roe Rutherford, NP. Schedule an appointment as soon as possible for a visit in 1 week(s).   Specialty: Adult Health Nurse Practitioner Contact information: [address] [place] [address] [phone]         Charlott Holler, MD. Go to.   Specialty: Pulmonary Disease Why: Your appointment on the 19th, and ask about smoking cessation and vaping Contact information: [address] [place]. [place] [address] [phone]                 Discharge Instructions     Diet - low sodium heart healthy   Complete by: As directed    Discharge instructions   Complete by: As directed    **IMPORTANT DISCHARGE INSTRUCTIONS**   From Dr. Maryfrances Bunnell: You were admitted for a  COPD/asthma exacerbation You were treated here with steroids, antibiotics and breathing treatments  You should finish the steroids with a six day taper: Take 6 tabs on day 1, 5 tabs on day 2, 4 tabs on day 3, etc.  Take 3 more days of antibiotics with azithromycin 250 mg noce daily starting tomorrow Wednesday  Resume your home Symbicort inhaler, I sent a refill to your pharmacy  Stop smoking  Nicotine patches and gum can be used together, they are available over the counter They take the edge off nicotine withdrawal  Medicines like wellbutrin (bupropion) or varenicline (Chantix) help  reduce cravings and help people quit  Use the 1-800-QUIT-NOW (819)712-5888) helpline   Increase activity slowly   Complete by: As directed        Discharge Exam: Filed Weights   05/24/22 1305  Weight: 81.6 kg    General: Pt is alert, awake, not in acute distress Cardiovascular: RRR, nl S1-S2, no murmurs appreciated.   No LE edema.   Respiratory: Normal respiratory rate and rhythm.  CTAB without rales or wheezes. Abdominal: Abdomen soft and non-tender.  No distension or HSM.   Neuro/Psych: Strength symmetric in upper and lower extremities.  Judgment and insight appear normal.   Condition at discharge: good  The results of significant diagnostics from this hospitalization (including imaging, microbiology, ancillary and laboratory) are listed below for reference.   Imaging Studies: ECHOCARDIOGRAM COMPLETE  Result Date: 05/26/2022    ECHOCARDIOGRAM REPORT   Patient Name:   [name] Date of Exam: 05/26/2022 Medical Rec #:  [id number]          Height:       64.0 in Accession #:    [id number]         Weight:       180.0 lb Date of Birth:  May 22, 1980          BSA:          1.871 m Patient Age:    41 years           BP:           143/96 mmHg Patient Gender: F                  HR:           115 bpm. Exam Location:  Inpatient Procedure: 2D Echo, Cardiac Doppler and Color Doppler Indications:     Elevated troponin  History:        Patient has no prior history of Echocardiogram examinations.                 COPD, Signs/Symptoms:Shortness of Breath; Risk                 Factors:Hypertension and Current Smoker.  Sonographer:    Milda Smart Referring Phys: [id number] DXAJ [id number]  Sonographer Comments: Suboptimal parasternal window. Image acquisition challenging due to patient body habitus and Image acquisition challenging due to respiratory motion. IMPRESSIONS  1. Left ventricular ejection fraction, by estimation, is 65 to 70%. The left ventricle has normal function. Left ventricular endocardial border not optimally defined to evaluate regional wall motion. Indeterminate diastolic filling due to E-A fusion.  2. Right ventricular systolic function is normal. The right ventricular size is normal. Tricuspid regurgitation signal is inadequate for assessing PA pressure.  3. The mitral valve is grossly normal. No evidence of mitral valve regurgitation. No evidence of mitral stenosis.  4. The aortic valve was not well visualized. Aortic valve regurgitation is not visualized. No aortic stenosis is present.  5. The inferior vena cava is normal in size with <50% respiratory variability, suggesting right atrial pressure of 8 mmHg. Comparison(s): No prior Echocardiogram. FINDINGS  Left Ventricle: Left ventricular ejection fraction, by estimation, is 65 to 70%. The left ventricle has normal function. Left ventricular endocardial border not optimally defined to evaluate regional wall motion. The left ventricular internal cavity size was normal in size. There is no left ventricular hypertrophy. Indeterminate diastolic filling due to E-A fusion. Right Ventricle: The right ventricular size is normal. No increase in right ventricular wall  thickness. Right ventricular systolic function is normal. Tricuspid regurgitation signal is inadequate for assessing PA pressure. Left Atrium: Left atrial size was normal in size. Right Atrium:  Right atrial size was normal in size. Pericardium: There is no evidence of pericardial effusion. Mitral Valve: The mitral valve is grossly normal. No evidence of mitral valve regurgitation. No evidence of mitral valve stenosis. Tricuspid Valve: The tricuspid valve is not well visualized. Tricuspid valve regurgitation is not demonstrated. No evidence of tricuspid stenosis. Aortic Valve: The aortic valve was not well visualized. Aortic valve regurgitation is not visualized. No aortic stenosis is present. Pulmonic Valve: The pulmonic valve was not well visualized. Pulmonic valve regurgitation is not visualized. No evidence of pulmonic stenosis. Aorta: The aortic root is normal in size and structure. Venous: The inferior vena cava is normal in size with less than 50% respiratory variability, suggesting right atrial pressure of 8 mmHg. IAS/Shunts: No atrial level shunt detected by color flow Doppler.  LEFT VENTRICLE PLAX 2D LVIDd:         4.20 cm   Diastology LVIDs:         2.70 cm   LV e' medial:    13.60 cm/s LV PW:         0.90 cm   LV E/e' medial:  10.7 LV IVS:        0.80 cm   LV e' lateral:   10.00 cm/s LVOT diam:     2.00 cm   LV E/e' lateral: 14.6 LV SV:         68 LV SV Index:   36 LVOT Area:     3.14 cm  RIGHT VENTRICLE             IVC RV S prime:     17.40 cm/s  IVC diam: 2.00 cm TAPSE (M-mode): 2.3 cm LEFT ATRIUM             Index        RIGHT ATRIUM           Index LA diam:        3.50 cm 1.87 cm/m   RA Area:     16.20 cm LA Vol (A2C):   46.0 ml 24.59 ml/m  RA Volume:   43.00 ml  22.99 ml/m LA Vol (A4C):   47.6 ml 25.45 ml/m LA Biplane Vol: 49.1 ml 26.25 ml/m  AORTIC VALVE LVOT Vmax:   141.00 cm/s LVOT Vmean:  95.500 cm/s LVOT VTI:    0.216 m  AORTA Ao Root diam: 2.90 cm Ao Asc diam:  3.20 cm MITRAL VALVE MV Area (PHT): 4.13 cm     SHUNTS MV Decel Time: 184 msec     Systemic VTI:  0.22 m MV E velocity: 145.50 cm/s  Systemic Diam: 2.00 cm Vishnu Priya Mallipeddi Electronically signed by Winfield Rast  Mallipeddi Signature Date/Time: 05/26/2022/4:44:55 PM    Final    CT Angio Chest PE W and/or Wo Contrast  Result Date: 05/24/2022 CLINICAL DATA:  Shortness of breath and wheezing. EXAM: CT ANGIOGRAPHY CHEST WITH CONTRAST TECHNIQUE: Multidetector CT imaging of the chest was performed using the standard protocol during bolus administration of intravenous contrast. Multiplanar CT image reconstructions and MIPs were obtained to evaluate the vascular anatomy. RADIATION DOSE REDUCTION: This exam was performed according to the departmental dose-optimization program which includes automated exposure control, adjustment of the mA and/or kV according to patient size and/or use of iterative reconstruction technique. CONTRAST:  48mL OMNIPAQUE IOHEXOL 350 MG/ML  SOLN COMPARISON:  Chest x-ray earlier 05/24/2022. Older x-rays. Multiple CT angiograms, most recent 08/29/2021 FINDINGS: Cardiovascular: Trace pericardial fluid. The heart is nonenlarged. The thoracic aorta has a normal course and caliber. Diffuse breathing motion. This can limit evaluation of small and peripheral emboli. No large or central embolus identified. Mediastinum/Nodes: No specific abnormal lymph node enlargement identified in the axillary region, hilum or mediastinum. Normal caliber thoracic esophagus. Lungs/Pleura: Breathing motion seen throughout the examination particularly at the lung bases. No pneumothorax or effusion. Subtle right lower lobe dependent opacity. Atelectasis versus infiltrate. Recommend follow-up. There is some mild scarring and atelectatic changes elsewhere along the lung bases including the left lower lobe, middle lobe and lingula. Upper Abdomen: In the upper abdomen the adrenal glands are preserved. There is atrophy of the right kidney. Replaced left hepatic artery. Previous cholecystectomy. Musculoskeletal: Scattered mild degenerative changes of the spine. Multilevel Schmorl's node changes. Review of the MIP images confirms the above  findings. IMPRESSION: Significant breathing motion.  No large or central embolus. Electronically Signed   By: Karen KaysAshok  Gupta M.D.   On: 05/24/2022 16:14   DG Chest Portable 1 View  Result Date: 05/24/2022 CLINICAL DATA:  Shortness of breath, recent influenza EXAM: PORTABLE CHEST 1 VIEW COMPARISON:  05/15/2022 FINDINGS: Cardiomegaly. Both lungs are clear. The visualized skeletal structures are unremarkable. IMPRESSION: Cardiomegaly without acute abnormality of the lungs in AP portable projection. Electronically Signed   By: Jearld LeschAlex D Bibbey M.D.   On: 05/24/2022 13:28   DG Chest 2 View  Result Date: 05/15/2022 CLINICAL DATA:  Dyspnea.  History of asthma. EXAM: CHEST - 2 VIEW COMPARISON:  08/29/2021 FINDINGS: Airway thickening is present, suggesting bronchitis or reactive airways disease. Cardiac and mediastinal margins appear normal. No airspace opacity or blunting of the costophrenic angles. No significant bony abnormality observed. IMPRESSION: 1. Airway thickening is present, suggesting bronchitis or reactive airways disease. Electronically Signed   By: Gaylyn RongWalter  Liebkemann M.D.   On: 05/15/2022 19:22    Microbiology: Results for orders placed or performed during the hospital encounter of 05/24/22  Resp panel by RT-PCR (RSV, Flu A&B, Covid) Anterior Nasal Swab     Status: None   Collection Time: 05/24/22  3:17 PM   Specimen: Anterior Nasal Swab  Result Value Ref Range Status   SARS Coronavirus 2 by RT PCR NEGATIVE NEGATIVE Final    Comment: (NOTE) SARS-CoV-2 target nucleic acids are NOT DETECTED.  The SARS-CoV-2 RNA is generally detectable in upper respiratory specimens during the acute phase of infection. The lowest concentration of SARS-CoV-2 viral copies this assay can detect is 138 copies/mL. A negative result does not preclude SARS-Cov-2 infection and should not be used as the sole basis for treatment or other patient management decisions. A negative result may occur with  improper specimen  collection/handling, submission of specimen other than nasopharyngeal swab, presence of viral mutation(s) within the areas targeted by this assay, and inadequate number of viral copies(<138 copies/mL). A negative result must be combined with clinical observations, patient history, and epidemiological information. The expected result is Negative.  Fact Sheet for Patients:  BloggerCourse.comhttps://www.fda.gov/media/152166/download  Fact Sheet for Healthcare Providers:  SeriousBroker.ithttps://www.fda.gov/media/152162/download  This test is no t yet approved or cleared by the [place] FDA and  has been authorized for detection and/or diagnosis of SARS-CoV-2 by FDA under an Emergency Use Authorization (EUA). This EUA will remain  in effect (meaning this test can be used) for the duration of the COVID-19 declaration under Section 564(b)(1) of the Act, 21  U.S.C.section 360bbb-3(b)(1), unless the authorization is terminated  or revoked sooner.       Influenza A by PCR NEGATIVE NEGATIVE Final   Influenza B by PCR NEGATIVE NEGATIVE Final    Comment: (NOTE) The Xpert Xpress SARS-CoV-2/FLU/RSV plus assay is intended as an aid in the diagnosis of influenza from Nasopharyngeal swab specimens and should not be used as a sole basis for treatment. Nasal washings and aspirates are unacceptable for Xpert Xpress SARS-CoV-2/FLU/RSV testing.  Fact Sheet for Patients: EntrepreneurPulse.com.au  Fact Sheet for Healthcare Providers: IncredibleEmployment.be  This test is not yet approved or cleared by the [place] FDA and has been authorized for detection and/or diagnosis of SARS-CoV-2 by FDA under an Emergency Use Authorization (EUA). This EUA will remain in effect (meaning this test can be used) for the duration of the COVID-19 declaration under Section 564(b)(1) of the Act, 21 U.S.C. section 360bbb-3(b)(1), unless the authorization is terminated or revoked.     Resp Syncytial  Virus by PCR NEGATIVE NEGATIVE Final    Comment: (NOTE) Fact Sheet for Patients: EntrepreneurPulse.com.au  Fact Sheet for Healthcare Providers: IncredibleEmployment.be  This test is not yet approved or cleared by the [place] FDA and has been authorized for detection and/or diagnosis of SARS-CoV-2 by FDA under an Emergency Use Authorization (EUA). This EUA will remain in effect (meaning this test can be used) for the duration of the COVID-19 declaration under Section 564(b)(1) of the Act, 21 U.S.C. section 360bbb-3(b)(1), unless the authorization is terminated or revoked.  Performed at [facility], [place] [address] [place], [place] [address]   Culture, blood (Routine X 2) w Reflex to ID Panel     Status: None (Preliminary result)   Collection Time: 05/27/22  4:48 PM   Specimen: BLOOD RIGHT ARM  Result Value Ref Range Status   Specimen Description   Final    BLOOD RIGHT ARM BOTTLES DRAWN AEROBIC ONLY Performed at [facility], [place] [address] [place], [place] [address]    Special Requests   Final    Blood Culture adequate volume Performed at [facility] [address] [place], [place] [address]    Culture   Final    NO GROWTH < 12 HOURS Performed at [facility] [address], [place], [place] [address]    Report Status PENDING  Incomplete  Culture, blood (Routine X 2) w Reflex to ID Panel     Status: None (Preliminary result)   Collection Time: 05/27/22  4:48 PM   Specimen: BLOOD LEFT ARM  Result Value Ref Range Status   Specimen Description   Final    BLOOD LEFT ARM BOTTLES DRAWN AEROBIC ONLY Performed at [facility] [address], [place], [place] [address]    Special Requests   Final    Blood Culture adequate volume Performed at [facility] [address], [place], [place] [address]    Culture   Final    NO GROWTH <  12 HOURS Performed at [facility] [address], [place], [place] [address]    Report Status PENDING  Incomplete    Labs: CBC: Recent Labs  Lab 05/24/22 1344 05/25/22 0458 05/26/22 0500 05/27/22 0348 05/28/22 0357  WBC 12.2* 13.6* 15.9* 17.5* 14.1*  NEUTROABS 9.1*  --  14.2* 15.6* 12.6*  HGB 14.7 14.8 12.8 13.4 12.6  HCT 47.8* 49.1* 41.8 43.5 40.7  MCV 99.2 101.2* 100.5* 100.9* 99.8  PLT 343  359 345 365 356   Basic Metabolic Panel: Recent Labs  Lab 05/24/22 1344 05/25/22 0458 05/26/22 0500 05/27/22 0348 05/28/22 0357  NA 136 141 140 139 137  K 3.8 4.6 4.5 4.9 4.4  CL 97* 99 101 99 100  CO2 31 34* 33* 32 29  GLUCOSE 110* 163* 161* 150* 214*  BUN 9 13 17  21* 18  CREATININE 1.02* 0.97 0.95 0.93 0.87  CALCIUM 8.5* 8.5* 7.9* 8.7* 8.2*  MG  --   --  2.5*  --   --   PHOS  --   --   --  4.4  --    Liver Function Tests: Recent Labs  Lab 05/24/22 1344  AST 21  ALT 22  ALKPHOS 76  BILITOT 0.5  PROT 6.8  ALBUMIN 3.5   CBG: No results for input(s): "GLUCAP" in the last 168 hours.  Discharge time spent: approximately 35 minutes spent on discharge counseling, evaluation of patient on day of discharge, and coordination of discharge planning with nursing, social work, pharmacy and case management  Signed: Alberteen Samhristopher P Ellisa Devivo, MD [facility] 05/28/2022

## 2022-05-28 NOTE — Assessment & Plan Note (Signed)
Trace in size.  No chest pain, doubt pericarditis. - Attn on follow up imaging

## 2022-05-28 NOTE — TOC Initial Note (Signed)
Transition of Care [facility]) - Initial/Assessment Note    Patient Details  Name: [name] MRN: [id number] Date of Birth: 05/20/1981  Transition of Care [facility]) CM/SW Contact:    Leeroy Cha, RN Phone Number: 05/28/2022, 11:02 AM  Clinical Narrative:                  Transition of Care [facility]) Screening Note   Patient Details  Name: [name] Date of Birth: 1980-08-04   Transition of Care [facility]) CM/SW Contact:    Leeroy Cha, RN Phone Number: 05/28/2022, 11:02 AM    Transition of Care Department [facility]) has reviewed patient and no TOC needs have been identified at this time. We will continue to monitor patient advancement through interdisciplinary progression rounds. If new patient transition needs arise, please place a TOC consult.    Expected Discharge Plan: Home/Self Care Barriers to Discharge: Barriers Resolved   Patient Goals and CMS Choice Patient states their goals for this hospitalization and ongoing recovery are:: to go home CMS Medicare.gov Compare Post Acute Care list provided to:: Patient Choice offered to / list presented to : Patient      Expected Discharge Plan and Services   Discharge Planning Services: CM Consult   Living arrangements for the past 2 months: Single Family Home Expected Discharge Date: 05/28/22                                    Prior Living Arrangements/Services Living arrangements for the past 2 months: Single Family Home Lives with:: Self Patient language and need for interpreter reviewed:: Yes Do you feel safe going back to the place where you live?: Yes            Criminal Activity/Legal Involvement Pertinent to Current Situation/Hospitalization: No - Comment as needed  Activities of Daily Living Home Assistive Devices/Equipment: None ADL Screening (condition at time of admission) Patient's cognitive ability adequate to safely complete daily activities?: Yes Is the patient deaf or have  difficulty hearing?: No Does the patient have difficulty seeing, even when wearing glasses/contacts?: No Does the patient have difficulty concentrating, remembering, or making decisions?: No Patient able to express need for assistance with ADLs?: Yes Does the patient have difficulty dressing or bathing?: No Independently performs ADLs?: Yes (appropriate for developmental age) Does the patient have difficulty walking or climbing stairs?: No Weakness of Legs: None Weakness of Arms/Hands: None  Permission Sought/Granted                  Emotional Assessment Appearance:: Appears stated age Attitude/Demeanor/Rapport: Engaged Affect (typically observed): Appropriate Orientation: : Oriented to Self, Oriented to Place, Oriented to  Time, Oriented to Situation Alcohol / Substance Use: Not Applicable Psych Involvement: No (comment)  Admission diagnosis:  Asthma exacerbation in COPD ([place]) [J44.1, J45.901] Acute on chronic respiratory failure with hypoxia ([place]) [J96.21] Patient Active Problem List   Diagnosis Date Noted   Obesity (BMI 30-39.9) 05/28/2022   Pericardial effusion 05/28/2022   Asthma exacerbation in COPD ([place]) 05/24/2022   Influenza A 05/15/2022   Elevated troponin 05/15/2022   Alcohol use disorder, moderate, in controlled environment ([place]) 01/30/2022   Alcohol use 08/30/2021   Tobacco use 08/30/2021   Abnormal LFTs 03/13/2020   Right upper quadrant abdominal pain 03/13/2020   Diarrhea 03/13/2020   MDD (major depressive disorder), recurrent severe, without psychosis ([place]) 07/23/2017   MDD (major depressive disorder), single  episode, severe , no psychosis ([facility]) 07/20/2017   Acute sinusitis with symptoms > 10 days 05/29/2017   Postpartum complication 06/13/2015   Normal labor and delivery 06/06/2015   Indication for care in labor or delivery 06/06/2015   S/P cesarean section 06/06/2015   Asthma, chronic, unspecified asthma severity, with acute exacerbation 07/29/2014    Depression with anxiety 07/29/2014   Smoking 07/29/2014   Hypertension 07/29/2014   Missed abortion 10/02/2013   PCP:  Roe Rutherford, NP Pharmacy:   [facility] [phone] - [place], [place] - [address] AT [address] [facility] [address] [place] [address] Phone: [phone] Fax: [phone]     Social Determinants of Health (SDOH) Social History: SDOH Screenings   Food Insecurity: No Food Insecurity (05/24/2022)  Housing: Low Risk  (05/24/2022)  Transportation Needs: No Transportation Needs (05/24/2022)  Utilities: Not At Risk (05/24/2022)  Alcohol Screen: Low Risk  (07/20/2017)  Depression (PHQ2-9): Medium Risk (01/30/2022)  Tobacco Use: High Risk (05/24/2022)   SDOH Interventions:     Readmission Risk Interventions   Row Labels 05/17/2022   12:29 PM 08/31/2021    1:52 PM  Readmission Risk Prevention Plan   Section Header. No data exists in this row.    Post Dischage Appt    Complete  Medication Screening    Complete  Transportation Screening   Complete Complete  PCP or Specialist Appt within 5-7 Days   Complete   Home Care Screening   Complete   Medication Review (RN CM)   Complete

## 2022-05-28 NOTE — Assessment & Plan Note (Signed)
Respiratory failure ruled out. 

## 2022-05-28 NOTE — TOC Transition Note (Signed)
Transition of Care [facility]) - CM/SW Discharge Note   Patient Details  Name: [name] MRN: [id number] Date of Birth: [birth year]  Transition of Care [facility]) CM/SW Contact:  Leeroy Cha, RN Phone Number: 05/28/2022, 11:02 AM   Clinical Narrative:     Patient discharge to return home with self care.  Final next level of care: Home/Self Care Barriers to Discharge: Barriers Resolved   Patient Goals and CMS Choice CMS Medicare.gov Compare Post Acute Care list provided to:: Patient Choice offered to / list presented to : Patient  Discharge Placement                         Discharge Plan and Services Additional resources added to the After Visit Summary for     Discharge Planning Services: CM Consult                                 Social Determinants of Health (SDOH) Interventions SDOH Screenings   Food Insecurity: No Food Insecurity (05/24/2022)  Housing: Low Risk  (05/24/2022)  Transportation Needs: No Transportation Needs (05/24/2022)  Utilities: Not At Risk (05/24/2022)  Alcohol Screen: Low Risk  (07/20/2017)  Depression (PHQ2-9): Medium Risk (01/30/2022)  Tobacco Use: High Risk (05/24/2022)     Readmission Risk Interventions   Row Labels 05/17/2022   12:29 PM 08/31/2021    1:52 PM  Readmission Risk Prevention Plan   Section Header. No data exists in this row.    Post Dischage Appt    Complete  Medication Screening    Complete  Transportation Screening   Complete Complete  PCP or Specialist Appt within 5-7 Days   Complete   Home Care Screening   Complete   Medication Review (RN CM)   Complete

## 2022-05-28 NOTE — Hospital Course (Signed)
[name] is a 42 y.o. F with asthma/COPD, HTN, obesity DMI 30.9, depression and smoking and recent admission with flu discharged 1 week PTA who presented with shortness of breath, cough that returned.   1/5: Admitted on steroids, requiring 3L O2

## 2022-06-01 LAB — CULTURE, BLOOD (ROUTINE X 2)
Culture: NO GROWTH
Culture: NO GROWTH
Special Requests: ADEQUATE
Special Requests: ADEQUATE

## 2022-06-07 ENCOUNTER — Encounter: Payer: Self-pay | Admitting: Nurse Practitioner

## 2022-06-07 ENCOUNTER — Ambulatory Visit: Payer: Medicaid Other | Admitting: Nurse Practitioner

## 2022-06-07 ENCOUNTER — Other Ambulatory Visit: Payer: Self-pay | Admitting: Nurse Practitioner

## 2022-06-07 VITALS — BP 134/78 | HR 121 | Temp 97.9°F | Ht 64.0 in | Wt 198.0 lb

## 2022-06-07 DIAGNOSIS — J4489 Other specified chronic obstructive pulmonary disease: Secondary | ICD-10-CM

## 2022-06-07 DIAGNOSIS — J45901 Unspecified asthma with (acute) exacerbation: Secondary | ICD-10-CM | POA: Diagnosis not present

## 2022-06-07 DIAGNOSIS — J441 Chronic obstructive pulmonary disease with (acute) exacerbation: Secondary | ICD-10-CM

## 2022-06-07 LAB — POCT EXHALED NITRIC OXIDE: FeNO level (ppb): 5

## 2022-06-07 MED ORDER — SPIRIVA RESPIMAT 2.5 MCG/ACT IN AERS: 2.0000 | INHALATION_SPRAY | Freq: Every day | RESPIRATORY_TRACT | 5 refills | Status: DC

## 2022-06-07 MED ORDER — ALBUTEROL SULFATE HFA 108 (90 BASE) MCG/ACT IN AERS: 2.0000 | INHALATION_SPRAY | Freq: Four times a day (QID) | RESPIRATORY_TRACT | 5 refills | Status: DC | PRN

## 2022-06-07 MED ORDER — ALBUTEROL SULFATE (2.5 MG/3ML) 0.083% IN NEBU
2.5000 mg | INHALATION_SOLUTION | Freq: Once | RESPIRATORY_TRACT | Status: AC
  Administered 2022-06-07: 2.5 mg via RESPIRATORY_TRACT

## 2022-06-07 MED ORDER — FLUTICASONE-SALMETEROL 230-21 MCG/ACT IN AERO: 2.0000 | INHALATION_SPRAY | Freq: Two times a day (BID) | RESPIRATORY_TRACT | 5 refills | Status: DC

## 2022-06-07 MED ORDER — DOXYCYCLINE HYCLATE 100 MG PO TABS: 100.0000 mg | ORAL_TABLET | Freq: Two times a day (BID) | ORAL | 0 refills | Status: AC

## 2022-06-07 MED ORDER — BENZONATATE 200 MG PO CAPS: 200.0000 mg | ORAL_CAPSULE | Freq: Three times a day (TID) | ORAL | 1 refills | Status: DC | PRN

## 2022-06-07 MED ORDER — PREDNISONE 10 MG PO TABS: ORAL_TABLET | ORAL | 0 refills | Status: DC

## 2022-06-07 NOTE — Progress Notes (Signed)
@Patient  ID: [name], female    DOB: 05-30-1980, 42 y.o.   MRN: [id number]  Chief Complaint  Patient presents with   Follow-up    In hospital for decreased O2 sats/COPD flare.  Pt states doing well today.  Needs refills on inhalers    Referring provider: Pablo Lawrence, NP  HPI: 42 year old female, active smoker followed for COPD/asthma overlap. She is a patient of Dr. Mauricio Po and last seen in office 03/07/2022. Past medical history significant for HTN, depression with anxiety, ETOH abuse.  TEST/EVENTS:  11/16/2015 PFT: FVC 94, FEV1 62, ratio 55, TLC 160, DLCounc 95 05/24/2022 CTA chest: No evidence of PE; although there was significant breathing motion so exam was limited.  Trace pericardial effusion.  No LAD.  Subtle right lower lobe dependent opacity.  Mild scarring and atelectatic changes elsewhere along the lung bases.  Atrophy of the right kidney.  03/07/2022: OV with Dr. Shearon Stalls for pre-operative evaluation for right foot surgery. Had surgery in April at outpatient surgical center and was hospitalized for flare of COPD for three days. Ended up going home without oxygen. Still smoking; cut pack to less than 1/2 ppd. Continued on Symbicort. Added spiriva. Recommended extubation to BiPAP and smoking cessation.   06/07/2022: Today - follow up Patient presents today for hospital follow-up.  She was admitted 05/15/2022 to 05/18/2022 for acute respiratory failure secondary to influenza A and asthma exacerbation.  She was treated with Tamiflu and steroids and clinically improved.  She then went back to the emergency department on 1/5 with increased shortness of breath after completing steroid course.  She also reported a productive cough with green to brown sputum.  She was treated for COPD/asthma exacerbation with steroids antibiotics and bronchodilators.  She was successfully weaned off oxygen and was able to ambulate without dyspnea or hypoxia on day of discharge.  She was given a  6-day steroid taper and 3 additional days of p.o. azithromycin. Today, she tells me that she is feeling some better but she is still more short winded than she usually is. She also continues to have a productive cough with yellow to green sputum. She has noticed more wheezing since completing her steroid course. Does have some baseline nasal congestion, which is unchanged. Denies fevers, chills, hemoptysis, leg swelling, orthopnea. She is on symbicort and spiriva. Takes singulair at bedtime. She is still smoking around 10-15 cigarettes a day.   Allergies  Allergen Reactions   Alprazolam Itching   Oxycodone Itching   Penicillins Nausea And Vomiting and Other (See Comments)    Has patient had a PCN reaction causing immediate rash, facial/tongue/throat swelling, SOB or lightheadedness with hypotension:No Has patient had a PCN reaction causing severe rash involving mucus membranes or skin necrosis:No Has patient had a PCN reaction that REACTION THAT REQUIRED HOSPITALIZATION:  #  #  #  YES  #  #  #  Has patient had a PCN reaction occurring within the last 10 years: #  #  #  YES  #  #  #    Norvasc [Amlodipine Besylate] Other (See Comments)    Tip of tongue numb and flushing    Immunization History  Administered Date(s) Administered   Influenza Inj Mdck Quad Pf 04/04/2021, 02/19/2022   Influenza,inj,Quad PF,6+ Mos 07/29/2014, 06/07/2015, 07/21/2017, 02/05/2018, 03/03/2019, 05/25/2022   Influenza-Unspecified 03/24/2012, 07/29/2014, 06/07/2015, 07/21/2017, 02/05/2018, 03/03/2019   Pneumococcal Polysaccharide-23 07/29/2014, 06/08/2015   Pneumococcal-Unspecified 07/29/2014, 06/08/2015   Tdap 06/07/2015    Past Medical  History:  Diagnosis Date   Anxiety    Asthma    Depression    doing good, not on meds now   GERD (gastroesophageal reflux disease)    Headache    migraines   History of kidney stones    Hypertension    Kidney stones    Missed abortion 10/02/2013   Ovarian cyst    Pregnancy  induced hypertension    Suicidal intent    Vaginal Pap smear, abnormal    bx, f/u ok    Tobacco History: Social History   Tobacco Use  Smoking Status Every Day   Packs/day: 0.25   Years: 15.00   Total pack years: 3.75   Types: Cigarettes  Smokeless Tobacco Never  Tobacco Comments   Smoking 2-3 cigs a day. 03/07/2022 Tay   Ready to quit: Not Answered Counseling given: Not Answered Tobacco comments: Smoking 2-3 cigs a day. 03/07/2022 Tay   Outpatient Medications Prior to Visit  Medication Sig Dispense Refill   busPIRone (BUSPAR) 10 MG tablet Take 10 mg by mouth 3 (three) times daily.     diazepam (VALIUM) 10 MG tablet Take 5-10 mg by mouth every 8 (eight) hours as needed for anxiety.     dicyclomine (BENTYL) 10 MG capsule Take 10 mg by mouth 3 (three) times daily before meals.     ibuprofen (ADVIL) 400 MG tablet Take 1,600 mg by mouth every 6 (six) hours as needed for mild pain or moderate pain.     ipratropium (ATROVENT) 0.03 % nasal spray Place 2 sprays into both nostrils 3 (three) times daily between meals as needed for rhinitis.     losartan (COZAAR) 50 MG tablet Take 50 mg by mouth daily.     methocarbamol (ROBAXIN) 500 MG tablet Take 1 tablet by mouth every 6 (six) hours as needed for muscle spasms.     methylphenidate 18 MG PO CR tablet Take 18 mg by mouth daily.     montelukast (SINGULAIR) 10 MG tablet Take 10 mg by mouth at bedtime.     nicotine (NICODERM CQ - DOSED IN MG/24 HOURS) 21 mg/24hr patch Place 21 mg onto the skin daily as needed (for smoking cessation).     OLANZapine (ZYPREXA) 10 MG tablet Take 10 mg by mouth at bedtime.     pantoprazole (PROTONIX) 40 MG tablet TAKE 1 TABLET BY MOUTH EVERY DAY (Patient taking differently: Take 40 mg by mouth daily before breakfast.) 30 tablet 0   venlafaxine XR (EFFEXOR-XR) 37.5 MG 24 hr capsule Take 37.5 mg by mouth daily with breakfast.     albuterol (PROAIR HFA) 108 (90 Base) MCG/ACT inhaler INHALE 1-2 PUFFS INTO THE LUNGS  EVERY 6 (SIX) HOURS AS NEEDED FOR WHEEZING OR SHORTNESS OF BREATH. (Patient taking differently: Inhale 2 puffs into the lungs every 6 (six) hours as needed for wheezing or shortness of breath.) 8.5 g 0   SYMBICORT 160-4.5 MCG/ACT inhaler Inhale 2 puffs into the lungs 2 (two) times daily. 1 each 12   Tiotropium Bromide Monohydrate (SPIRIVA RESPIMAT) 2.5 MCG/ACT AERS Inhale 2 puffs into the lungs daily. 1 each 5   azithromycin (ZITHROMAX) 250 MG tablet Take 1 tablet (250 mg total) by mouth daily. (Patient not taking: Reported on 06/07/2022) 3 tablet 0   Chlorpheniramine-Acetaminophen (CORICIDIN HBP COLD/FLU PO) Take 2 tablets by mouth daily as needed (Cold and cough symptoms). (Patient not taking: Reported on 06/07/2022)     predniSONE (DELTASONE) 10 MG tablet Takes 6 tablets for 1 days,  then 5 tablets for 1 days, then 4 tablets for 1 days, then 3 tablets for 1 days, then 2 tabs for 1 days, then 1 tab for 1 days, and then stop. (Patient not taking: Reported on 06/07/2022) 21 tablet 0   No facility-administered medications prior to visit.     Review of Systems:   Constitutional: No weight loss or gain, night sweats, fevers, chills, fatigue, or lassitude. HEENT: No headaches, difficulty swallowing, tooth/dental problems, or sore throat. No sneezing, itching, ear ache. + nasal congestion, post nasal drip CV:  No chest pain, orthopnea, PND, swelling in lower extremities, anasarca, dizziness, palpitations, syncope Resp: +shortness of breath with exertion; productive cough; wheezing.  No hemoptysis.   No chest wall deformity GI:  No heartburn, indigestion, abdominal pain, nausea, vomiting, diarrhea, change in bowel habits, loss of appetite GU: No dysuria, change in color of urine, urgency or frequency.   Skin: No rash, lesions, ulcerations MSK:  No joint pain or swelling.   Neuro: No dizziness or lightheadedness.  Psych: No depression or anxiety. Mood stable.     Physical Exam:  BP 134/78 (BP  Location: Left Arm, Patient Position: Sitting, Cuff Size: Large)   Pulse (!) 121   Temp 97.9 F (36.6 C) (Oral)   Ht 5\' 4"  (1.626 m)   Wt 198 lb (89.8 kg)   LMP 05/13/2022   SpO2 95%   BMI 33.99 kg/m   GEN: Pleasant, interactive, well-appearing; obese; in no acute distress. HEENT:  Normocephalic and atraumatic. PERRLA. Sclera white. Nasal turbinates pink, moist and patent bilaterally. No rhinorrhea present. Oropharynx pink and moist, without exudate or edema. No lesions, ulcerations, or postnasal drip.  NECK:  Supple w/ fair ROM. No JVD present. Normal carotid impulses w/o bruits. Thyroid symmetrical with no goiter or nodules palpated. No lymphadenopathy.   CV: RRR, no m/r/g, no peripheral edema. Pulses intact, +2 bilaterally. No cyanosis, pallor or clubbing. PULMONARY:  Unlabored, regular breathing. Scattered wheezes bilaterally A&P. Congested cough. No accessory muscle use.  GI: BS present and normoactive. Soft, non-tender to palpation. No organomegaly or masses detected.  MSK: No erythema, warmth or tenderness. Cap refil <2 sec all extrem. No deformities or joint swelling noted.  Neuro: A/Ox3. No focal deficits noted.   Skin: Warm, no lesions or rashe Psych: Normal affect and behavior. Judgement and thought content appropriate.     Lab Results:  CBC    Component Value Date/Time   WBC 14.1 (H) 05/28/2022 0357   RBC 4.08 05/28/2022 0357   HGB 12.6 05/28/2022 0357   HGB 16.1 (H) 08/19/2019 1426   HCT 40.7 05/28/2022 0357   HCT 48.6 (H) 08/19/2019 1426   PLT 356 05/28/2022 0357   PLT 347 08/19/2019 1426   MCV 99.8 05/28/2022 0357   MCV 90 08/19/2019 1426   MCH 30.9 05/28/2022 0357   MCHC 31.0 05/28/2022 0357   RDW 14.2 05/28/2022 0357   RDW 13.3 08/19/2019 1426   LYMPHSABS 0.6 (L) 05/28/2022 0357   MONOABS 0.4 05/28/2022 0357   EOSABS 0.0 05/28/2022 0357   BASOSABS 0.1 05/28/2022 0357    BMET    Component Value Date/Time   NA 137 05/28/2022 0357   NA 138 03/13/2020  1115   K 4.4 05/28/2022 0357   CL 100 05/28/2022 0357   CO2 29 05/28/2022 0357   GLUCOSE 214 (H) 05/28/2022 0357   BUN 18 05/28/2022 0357   BUN 9 03/13/2020 1115   CREATININE 0.87 05/28/2022 0357   CREATININE 1.09 10/31/2015  1506   CALCIUM 8.2 (L) 05/28/2022 0357   GFRNONAA >60 05/28/2022 0357   GFRNONAA 67 10/31/2015 1506   GFRAA 96 03/13/2020 1115   GFRAA 78 10/31/2015 1506    BNP No results found for: "BNP"   Imaging:  ECHOCARDIOGRAM COMPLETE  Result Date: 05/26/2022    ECHOCARDIOGRAM REPORT   Patient Name:   MOSE SELVIDGE Date of Exam: 05/26/2022 Medical Rec #:  [id number]          Height:       64.0 in Accession #:    [id number]         Weight:       180.0 lb Date of Birth:  09/11/1980          BSA:          1.871 m Patient Age:    58 years           BP:           143/96 mmHg Patient Gender: F                  HR:           115 bpm. Exam Location:  Inpatient Procedure: 2D Echo, Cardiac Doppler and Color Doppler Indications:    Elevated troponin  History:        Patient has no prior history of Echocardiogram examinations.                 COPD, Signs/Symptoms:Shortness of Breath; Risk                 Factors:Hypertension and Current Smoker.  Sonographer:    Eartha Inch Referring Phys: [id number]  Sonographer Comments: Suboptimal parasternal window. Image acquisition challenging due to patient body habitus and Image acquisition challenging due to respiratory motion. IMPRESSIONS  1. Left ventricular ejection fraction, by estimation, is 65 to 70%. The left ventricle has normal function. Left ventricular endocardial border not optimally defined to evaluate regional wall motion. Indeterminate diastolic filling due to E-A fusion.  2. Right ventricular systolic function is normal. The right ventricular size is normal. Tricuspid regurgitation signal is inadequate for assessing PA pressure.  3. The mitral valve is grossly normal. No evidence of mitral valve regurgitation. No evidence of  mitral stenosis.  4. The aortic valve was not well visualized. Aortic valve regurgitation is not visualized. No aortic stenosis is present.  5. The inferior vena cava is normal in size with <50% respiratory variability, suggesting right atrial pressure of 8 mmHg. Comparison(s): No prior Echocardiogram. FINDINGS  Left Ventricle: Left ventricular ejection fraction, by estimation, is 65 to 70%. The left ventricle has normal function. Left ventricular endocardial border not optimally defined to evaluate regional wall motion. The left ventricular internal cavity size was normal in size. There is no left ventricular hypertrophy. Indeterminate diastolic filling due to E-A fusion. Right Ventricle: The right ventricular size is normal. No increase in right ventricular wall thickness. Right ventricular systolic function is normal. Tricuspid regurgitation signal is inadequate for assessing PA pressure. Left Atrium: Left atrial size was normal in size. Right Atrium: Right atrial size was normal in size. Pericardium: There is no evidence of pericardial effusion. Mitral Valve: The mitral valve is grossly normal. No evidence of mitral valve regurgitation. No evidence of mitral valve stenosis. Tricuspid Valve: The tricuspid valve is not well visualized. Tricuspid valve regurgitation is not demonstrated. No evidence of tricuspid stenosis. Aortic Valve: The aortic valve was not well visualized.  Aortic valve regurgitation is not visualized. No aortic stenosis is present. Pulmonic Valve: The pulmonic valve was not well visualized. Pulmonic valve regurgitation is not visualized. No evidence of pulmonic stenosis. Aorta: The aortic root is normal in size and structure. Venous: The inferior vena cava is normal in size with less than 50% respiratory variability, suggesting right atrial pressure of 8 mmHg. IAS/Shunts: No atrial level shunt detected by color flow Doppler.  LEFT VENTRICLE PLAX 2D LVIDd:         4.20 cm   Diastology LVIDs:          2.70 cm   LV e' medial:    13.60 cm/s LV PW:         0.90 cm   LV E/e' medial:  10.7 LV IVS:        0.80 cm   LV e' lateral:   10.00 cm/s LVOT diam:     2.00 cm   LV E/e' lateral: 14.6 LV SV:         68 LV SV Index:   36 LVOT Area:     3.14 cm  RIGHT VENTRICLE             IVC RV S prime:     17.40 cm/s  IVC diam: 2.00 cm TAPSE (M-mode): 2.3 cm LEFT ATRIUM             Index        RIGHT ATRIUM           Index LA diam:        3.50 cm 1.87 cm/m   RA Area:     16.20 cm LA Vol (A2C):   46.0 ml 24.59 ml/m  RA Volume:   43.00 ml  22.99 ml/m LA Vol (A4C):   47.6 ml 25.45 ml/m LA Biplane Vol: 49.1 ml 26.25 ml/m  AORTIC VALVE LVOT Vmax:   141.00 cm/s LVOT Vmean:  95.500 cm/s LVOT VTI:    0.216 m  AORTA Ao Root diam: 2.90 cm Ao Asc diam:  3.20 cm MITRAL VALVE MV Area (PHT): 4.13 cm     SHUNTS MV Decel Time: 184 msec     Systemic VTI:  0.22 m MV E velocity: 145.50 cm/s  Systemic Diam: 2.00 cm Vishnu Priya Mallipeddi Electronically signed by Lorelee Cover Mallipeddi Signature Date/Time: 05/26/2022/4:44:55 PM    Final    CT Angio Chest PE W and/or Wo Contrast  Result Date: 05/24/2022 CLINICAL DATA:  Shortness of breath and wheezing. EXAM: CT ANGIOGRAPHY CHEST WITH CONTRAST TECHNIQUE: Multidetector CT imaging of the chest was performed using the standard protocol during bolus administration of intravenous contrast. Multiplanar CT image reconstructions and MIPs were obtained to evaluate the vascular anatomy. RADIATION DOSE REDUCTION: This exam was performed according to the departmental dose-optimization program which includes automated exposure control, adjustment of the mA and/or kV according to patient size and/or use of iterative reconstruction technique. CONTRAST:  57mL OMNIPAQUE IOHEXOL 350 MG/ML SOLN COMPARISON:  Chest x-ray earlier 05/24/2022. Older x-rays. Multiple CT angiograms, most recent 08/29/2021 FINDINGS: Cardiovascular: Trace pericardial fluid. The heart is nonenlarged. The thoracic aorta has a normal  course and caliber. Diffuse breathing motion. This can limit evaluation of small and peripheral emboli. No large or central embolus identified. Mediastinum/Nodes: No specific abnormal lymph node enlargement identified in the axillary region, hilum or mediastinum. Normal caliber thoracic esophagus. Lungs/Pleura: Breathing motion seen throughout the examination particularly at the lung bases. No pneumothorax or effusion. Subtle right lower lobe dependent  opacity. Atelectasis versus infiltrate. Recommend follow-up. There is some mild scarring and atelectatic changes elsewhere along the lung bases including the left lower lobe, middle lobe and lingula. Upper Abdomen: In the upper abdomen the adrenal glands are preserved. There is atrophy of the right kidney. Replaced left hepatic artery. Previous cholecystectomy. Musculoskeletal: Scattered mild degenerative changes of the spine. Multilevel Schmorl's node changes. Review of the MIP images confirms the above findings. IMPRESSION: Significant breathing motion.  No large or central embolus. Electronically Signed   By: Jill Side M.D.   On: 05/24/2022 16:14   DG Chest Portable 1 View  Result Date: 05/24/2022 CLINICAL DATA:  Shortness of breath, recent influenza EXAM: PORTABLE CHEST 1 VIEW COMPARISON:  05/15/2022 FINDINGS: Cardiomegaly. Both lungs are clear. The visualized skeletal structures are unremarkable. IMPRESSION: Cardiomegaly without acute abnormality of the lungs in AP portable projection. Electronically Signed   By: Delanna Ahmadi M.D.   On: 05/24/2022 13:28   DG Chest 2 View  Result Date: 05/15/2022 CLINICAL DATA:  Dyspnea.  History of asthma. EXAM: CHEST - 2 VIEW COMPARISON:  08/29/2021 FINDINGS: Airway thickening is present, suggesting bronchitis or reactive airways disease. Cardiac and mediastinal margins appear normal. No airspace opacity or blunting of the costophrenic angles. No significant bony abnormality observed. IMPRESSION: 1. Airway thickening  is present, suggesting bronchitis or reactive airways disease. Electronically Signed   By: Van Clines M.D.   On: 05/15/2022 19:22    albuterol (PROVENTIL) (2.5 MG/3ML) 0.083% nebulizer solution 2.5 mg     Date Action Dose Route User   06/07/2022 1145 Given 2.5 mg Nebulization Donney Rankins, CMA          Latest Ref Rng & Units 11/16/2015   12:47 PM  PFT Results  FVC-Pre L 3.54   FVC-Predicted Pre % 94   Pre FEV1/FVC % % 55   FEV1-Pre L 1.96   FEV1-Predicted Pre % 62   DLCO uncorrected ml/min/mmHg 23.29   DLCO UNC% % 95   DLVA Predicted % 97   TLC L 8.11   TLC % Predicted % 160   RV % Predicted % 292     Lab Results  Component Value Date   NITRICOXIDE 7 10/18/2015        Assessment & Plan:   Acute exacerbation of COPD with asthma ([place]) Unresolved AECOPD/asthma. We will treat her with extended steroid course and empiric abx. CXR today to rule out superimposed infection. She was given an albuterol neb in office with improvement in aeration. She has poor control at baseline with two recent hospitalizations and one in early 2023. She continues to smoke, which we discussed is contributing to her frequent flares and poor control. Limited on smoking cessation options due to her psychiatric history and medications. FeNO today was nl but she has received good benefit from PO steroids so I am going to change her from Symbicort to high dose Advair. She will continue Spiriva and singulair. Plan to check eosinophils once she is off steroids for 4 weeks. Asthma/COPD action plan in place. Strict return/ED precautions. VS stable and non-toxic appearing.  Patient Instructions  Continue Albuterol inhaler 2 puffs or 3 mL neb every 6 hours as needed for shortness of breath or wheezing. Notify if symptoms persist despite rescue inhaler/neb use. Use your nebulizer at least 4 times a day until symptoms improve Continue spiriva 2 puffs daily Continue singulair 10 mg At bedtime Continue  pantoprazole 1 tab daily  Prednisone taper. 4 tabs for 3  days, then 3 tabs for 3 days, 2 tabs for 3 days, then 1 tab for 3 days, then 5 mg for 3 days then stop. Take in AM with food. Start tomorrow. Doxycycline 1 tab Twice daily for 7 days. Take with food. Wear sunscreen when on this medication as it can increase your risk for sunburns Guaifenesin [phone] mg Twice daily for congestion/cough Benzonatate 1 capsule Three times a day as needed for cough Delsym 2 tsp Twice daily as needed for cough Start over the counter allergy pill such as Zyrtec, Claritin or Xyzal. Ok to get the store brand version of these  Chest x ray today   Follow up in 2 weeks with Joellen Jersey Zylan Almquist,NP as Dr. Shearon Stalls is out of the office. If symptoms do not improve or worsen, please contact office for sooner follow up or seek emergency care.    I spent 42 minutes of dedicated to the care of this patient on the date of this encounter to include pre-visit review of records, face-to-face time with the patient discussing conditions above, post visit ordering of testing, clinical documentation with the electronic health record, making appropriate referrals as documented, and communicating necessary findings to members of the patients care team.  Clayton Bibles, NP 06/07/2022  Pt aware and understands NP's role.

## 2022-06-07 NOTE — Assessment & Plan Note (Signed)
Unresolved AECOPD/asthma. We will treat her with extended steroid course and empiric abx. CXR today to rule out superimposed infection. She was given an albuterol neb in office with improvement in aeration. She has poor control at baseline with two recent hospitalizations and one in early 2023. She continues to smoke, which we discussed is contributing to her frequent flares and poor control. Limited on smoking cessation options due to her psychiatric history and medications. FeNO today was nl but she has received good benefit from PO steroids so I am going to change her from Symbicort to high dose Advair. She will continue Spiriva and singulair. Plan to check eosinophils once she is off steroids for 4 weeks. Asthma/COPD action plan in place. Strict return/ED precautions. VS stable and non-toxic appearing.  Patient Instructions  Continue Albuterol inhaler 2 puffs or 3 mL neb every 6 hours as needed for shortness of breath or wheezing. Notify if symptoms persist despite rescue inhaler/neb use. Use your nebulizer at least 4 times a day until symptoms improve Continue spiriva 2 puffs daily Continue singulair 10 mg At bedtime Continue pantoprazole 1 tab daily  Prednisone taper. 4 tabs for 3 days, then 3 tabs for 3 days, 2 tabs for 3 days, then 1 tab for 3 days, then 5 mg for 3 days then stop. Take in AM with food. Start tomorrow. Doxycycline 1 tab Twice daily for 7 days. Take with food. Wear sunscreen when on this medication as it can increase your risk for sunburns Guaifenesin [id number] mg Twice daily for congestion/cough Benzonatate 1 capsule Three times a day as needed for cough Delsym 2 tsp Twice daily as needed for cough Start over the counter allergy pill such as Zyrtec, Claritin or Xyzal. Ok to get the store brand version of these  Chest x ray today   Follow up in 2 weeks with Joellen Jersey Kahli Mayon,NP as Dr. Shearon Stalls is out of the office. If symptoms do not improve or worsen, please contact office for sooner  follow up or seek emergency care.

## 2022-06-07 NOTE — Patient Instructions (Addendum)
Continue Albuterol inhaler 2 puffs or 3 mL neb every 6 hours as needed for shortness of breath or wheezing. Notify if symptoms persist despite rescue inhaler/neb use. Use your nebulizer at least 4 times a day until symptoms improve Continue spiriva 2 puffs daily Continue singulair 10 mg At bedtime Continue pantoprazole 1 tab daily  Prednisone taper. 4 tabs for 3 days, then 3 tabs for 3 days, 2 tabs for 3 days, then 1 tab for 3 days, then 5 mg for 3 days then stop. Take in AM with food. Start tomorrow. Doxycycline 1 tab Twice daily for 7 days. Take with food. Wear sunscreen when on this medication as it can increase your risk for sunburns Guaifenesin [phone] mg Twice daily for congestion/cough Benzonatate 1 capsule Three times a day as needed for cough Delsym 2 tsp Twice daily as needed for cough Start over the counter allergy pill such as Zyrtec, Claritin or Xyzal. Ok to get the store brand version of these  Chest x ray today   Follow up in 2 weeks with Joellen Jersey Leslie Jester,NP as Dr. Shearon Stalls is out of the office. If symptoms do not improve or worsen, please contact office for sooner follow up or seek emergency care.

## 2022-06-10 NOTE — Telephone Encounter (Signed)
Can we contact the pharmacy and see if they are out of stock or what the problem is? Thanks!

## 2022-06-12 ENCOUNTER — Telehealth: Payer: Self-pay | Admitting: Internal Medicine

## 2022-06-12 NOTE — Telephone Encounter (Signed)
Last visit NP said she would call in the following:  Continue spiriva 2 puffs daily   Please send in or verify it was sent. TY.  [facility] [address] [facility] they need Dr.'s Auth.)   [phone] is her CB

## 2022-06-13 NOTE — Telephone Encounter (Signed)
Rx was [facility] 1/[facility] [address]. Routing to prior auth team to see if an auth needs to be done. Left message for pt to make her aware of this.

## 2022-06-17 ENCOUNTER — Emergency Department (HOSPITAL_COMMUNITY): Payer: Medicaid Other

## 2022-06-17 ENCOUNTER — Inpatient Hospital Stay (HOSPITAL_COMMUNITY)
Admission: EM | Admit: 2022-06-17 | Discharge: 2022-06-22 | DRG: 917 | Disposition: A | Discharge status: Home / Self Care | Payer: Medicaid Other | Attending: Family Medicine | Admitting: Family Medicine

## 2022-06-17 ENCOUNTER — Other Ambulatory Visit: Payer: Self-pay

## 2022-06-17 DIAGNOSIS — Z7729 Contact with and (suspected ) exposure to other hazardous substances: Secondary | ICD-10-CM

## 2022-06-17 DIAGNOSIS — Z825 Family history of asthma and other chronic lower respiratory diseases: Secondary | ICD-10-CM

## 2022-06-17 DIAGNOSIS — Z880 Allergy status to penicillin: Secondary | ICD-10-CM

## 2022-06-17 DIAGNOSIS — J9602 Acute respiratory failure with hypercapnia: Secondary | ICD-10-CM | POA: Diagnosis present

## 2022-06-17 DIAGNOSIS — F1010 Alcohol abuse, uncomplicated: Secondary | ICD-10-CM | POA: Diagnosis present

## 2022-06-17 DIAGNOSIS — J441 Chronic obstructive pulmonary disease with (acute) exacerbation: Secondary | ICD-10-CM | POA: Diagnosis present

## 2022-06-17 DIAGNOSIS — Z833 Family history of diabetes mellitus: Secondary | ICD-10-CM

## 2022-06-17 DIAGNOSIS — E872 Acidosis, unspecified: Secondary | ICD-10-CM | POA: Diagnosis present

## 2022-06-17 DIAGNOSIS — Y92009 Unspecified place in unspecified non-institutional (private) residence as the place of occurrence of the external cause: Secondary | ICD-10-CM

## 2022-06-17 DIAGNOSIS — F418 Other specified anxiety disorders: Secondary | ICD-10-CM | POA: Diagnosis present

## 2022-06-17 DIAGNOSIS — Z77028 Contact with and (suspected) exposure to other hazardous aromatic compounds: Secondary | ICD-10-CM | POA: Diagnosis present

## 2022-06-17 DIAGNOSIS — E8729 Other acidosis: Secondary | ICD-10-CM | POA: Diagnosis not present

## 2022-06-17 DIAGNOSIS — T59811A Toxic effect of smoke, accidental (unintentional), initial encounter: Principal | ICD-10-CM | POA: Diagnosis present

## 2022-06-17 DIAGNOSIS — I10 Essential (primary) hypertension: Secondary | ICD-10-CM | POA: Diagnosis present

## 2022-06-17 DIAGNOSIS — G9341 Metabolic encephalopathy: Secondary | ICD-10-CM | POA: Diagnosis not present

## 2022-06-17 DIAGNOSIS — Z9049 Acquired absence of other specified parts of digestive tract: Secondary | ICD-10-CM

## 2022-06-17 DIAGNOSIS — Z885 Allergy status to narcotic agent status: Secondary | ICD-10-CM

## 2022-06-17 DIAGNOSIS — Z832 Family history of diseases of the blood and blood-forming organs and certain disorders involving the immune mechanism: Secondary | ICD-10-CM

## 2022-06-17 DIAGNOSIS — Z720 Tobacco use: Secondary | ICD-10-CM | POA: Diagnosis present

## 2022-06-17 DIAGNOSIS — Z79899 Other long term (current) drug therapy: Secondary | ICD-10-CM

## 2022-06-17 DIAGNOSIS — Z87442 Personal history of urinary calculi: Secondary | ICD-10-CM

## 2022-06-17 DIAGNOSIS — Z888 Allergy status to other drugs, medicaments and biological substances status: Secondary | ICD-10-CM

## 2022-06-17 DIAGNOSIS — F32A Depression, unspecified: Secondary | ICD-10-CM | POA: Diagnosis present

## 2022-06-17 DIAGNOSIS — Z7951 Long term (current) use of inhaled steroids: Secondary | ICD-10-CM

## 2022-06-17 DIAGNOSIS — F17210 Nicotine dependence, cigarettes, uncomplicated: Secondary | ICD-10-CM | POA: Diagnosis present

## 2022-06-17 DIAGNOSIS — Z1152 Encounter for screening for COVID-19: Secondary | ICD-10-CM

## 2022-06-17 DIAGNOSIS — J4551 Severe persistent asthma with (acute) exacerbation: Secondary | ICD-10-CM | POA: Diagnosis present

## 2022-06-17 DIAGNOSIS — J705 Respiratory conditions due to smoke inhalation: Secondary | ICD-10-CM | POA: Diagnosis present

## 2022-06-17 DIAGNOSIS — J9601 Acute respiratory failure with hypoxia: Principal | ICD-10-CM | POA: Diagnosis present

## 2022-06-17 DIAGNOSIS — Z822 Family history of deafness and hearing loss: Secondary | ICD-10-CM

## 2022-06-17 DIAGNOSIS — F419 Anxiety disorder, unspecified: Secondary | ICD-10-CM | POA: Diagnosis present
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
## 2022-06-17 NOTE — ED Triage Notes (Addendum)
Pt BIB EMS c/o shortness of breath hx ashtma, flu a few weeks ago. Pt 60% on room air on EMS arrival 15g Albuterol 2g Mag 125 Solumedrol 20g left forearm

## 2022-06-18 ENCOUNTER — Observation Stay (HOSPITAL_COMMUNITY): Payer: Medicaid Other

## 2022-06-18 DIAGNOSIS — F418 Other specified anxiety disorders: Secondary | ICD-10-CM

## 2022-06-18 DIAGNOSIS — J9602 Acute respiratory failure with hypercapnia: Secondary | ICD-10-CM | POA: Diagnosis not present

## 2022-06-18 DIAGNOSIS — J9601 Acute respiratory failure with hypoxia: Secondary | ICD-10-CM | POA: Diagnosis not present

## 2022-06-18 DIAGNOSIS — Z7729 Contact with and (suspected ) exposure to other hazardous substances: Secondary | ICD-10-CM | POA: Diagnosis present

## 2022-06-18 DIAGNOSIS — J441 Chronic obstructive pulmonary disease with (acute) exacerbation: Secondary | ICD-10-CM | POA: Diagnosis not present

## 2022-06-18 DIAGNOSIS — J9621 Acute and chronic respiratory failure with hypoxia: Secondary | ICD-10-CM | POA: Diagnosis not present

## 2022-06-18 DIAGNOSIS — J4541 Moderate persistent asthma with (acute) exacerbation: Secondary | ICD-10-CM

## 2022-06-18 DIAGNOSIS — J9622 Acute and chronic respiratory failure with hypercapnia: Secondary | ICD-10-CM

## 2022-06-18 DIAGNOSIS — T59811A Toxic effect of smoke, accidental (unintentional), initial encounter: Secondary | ICD-10-CM | POA: Insufficient documentation

## 2022-06-18 DIAGNOSIS — I10 Essential (primary) hypertension: Secondary | ICD-10-CM

## 2022-06-18 DIAGNOSIS — J45901 Unspecified asthma with (acute) exacerbation: Secondary | ICD-10-CM

## 2022-06-18 DIAGNOSIS — Z720 Tobacco use: Secondary | ICD-10-CM

## 2022-06-18 LAB — CBC
HCT: 42.7 % (ref 36.0–46.0)
Hemoglobin: 12.7 g/dL (ref 12.0–15.0)
MCH: 31.1 pg (ref 26.0–34.0)
MCHC: 29.7 g/dL — ABNORMAL LOW (ref 30.0–36.0)
MCV: 104.7 fL — ABNORMAL HIGH (ref 80.0–100.0)
Platelets: 254 10*3/uL (ref 150–400)
RBC: 4.08 MIL/uL (ref 3.87–5.11)
RDW: 15.8 % — ABNORMAL HIGH (ref 11.5–15.5)
WBC: 7.5 10*3/uL (ref 4.0–10.5)
nRBC: 0 % (ref 0.0–0.2)

## 2022-06-18 LAB — COMPREHENSIVE METABOLIC PANEL
ALT: 30 U/L (ref 0–44)
AST: 37 U/L (ref 15–41)
Albumin: 3.5 g/dL (ref 3.5–5.0)
Alkaline Phosphatase: 79 U/L (ref 38–126)
Anion gap: 8 (ref 5–15)
BUN: 10 mg/dL (ref 6–20)
CO2: 32 mmol/L (ref 22–32)
Calcium: 8.5 mg/dL — ABNORMAL LOW (ref 8.9–10.3)
Chloride: 97 mmol/L — ABNORMAL LOW (ref 98–111)
Creatinine, Ser: 1.02 mg/dL — ABNORMAL HIGH (ref 0.44–1.00)
GFR, Estimated: >60 mL/min (ref >60)
Glucose, Bld: 130 mg/dL — ABNORMAL HIGH (ref 70–99)
Potassium: 4.3 mmol/L (ref 3.5–5.1)
Sodium: 137 mmol/L (ref 135–145)
Total Bilirubin: 0.2 mg/dL — ABNORMAL LOW (ref 0.3–1.2)
Total Protein: 5.8 g/dL — ABNORMAL LOW (ref 6.5–8.1)

## 2022-06-18 LAB — RESP PANEL BY RT-PCR (RSV, FLU A&B, COVID)  RVPGX2
Influenza A by PCR: NEGATIVE
Influenza B by PCR: NEGATIVE
Resp Syncytial Virus by PCR: NEGATIVE
SARS Coronavirus 2 by RT PCR: NEGATIVE

## 2022-06-18 LAB — BLOOD GAS, ARTERIAL
Acid-Base Excess: 9.4 mmol/L — ABNORMAL HIGH (ref 0.0–2.0)
Acid-Base Excess: 10.7 mmol/L — ABNORMAL HIGH (ref 0.0–2.0)
Acid-Base Excess: 13.1 mmol/L — ABNORMAL HIGH (ref 0.0–2.0)
Bicarbonate: 41.6 mmol/L — ABNORMAL HIGH (ref 20.0–28.0)
Bicarbonate: 42.4 mmol/L — ABNORMAL HIGH (ref 20.0–28.0)
Bicarbonate: 44.5 mmol/L — ABNORMAL HIGH (ref 20.0–28.0)
Delivery systems: POSITIVE
Delivery systems: POSITIVE
Drawn by: 54496
Drawn by: 560031
Drawn by: 560031
Expiratory PAP: 6 cmH2O
Expiratory PAP: 6 cmH2O
FIO2: 100 %
FIO2: 100 %
Inspiratory PAP: 20 cmH2O
Inspiratory PAP: 20 cmH2O
Mode: POSITIVE
Mode: POSITIVE
O2 Saturation: 98.4 %
O2 Saturation: 99.6 %
O2 Saturation: 100 %
Patient temperature: 36.8
Patient temperature: 36.3
Patient temperature: 36.4
RATE: 16 resp/min
RATE: 16 resp/min
pCO2 arterial: 103 mmHg (ref 32–48)
pCO2 arterial: 96 mmHg (ref 32–48)
pCO2 arterial: 94 mmHg (ref 32–48)
pH, Arterial: 7.21 — ABNORMAL LOW (ref 7.35–7.45)
pH, Arterial: 7.25 — ABNORMAL LOW (ref 7.35–7.45)
pH, Arterial: 7.28 — ABNORMAL LOW (ref 7.35–7.45)
pO2, Arterial: 107 mmHg (ref 83–108)
pO2, Arterial: 269 mmHg — ABNORMAL HIGH (ref 83–108)
pO2, Arterial: 290 mmHg — ABNORMAL HIGH (ref 83–108)

## 2022-06-18 LAB — COOXEMETRY PANEL
Carboxyhemoglobin: 3.5 % — ABNORMAL HIGH (ref 0.5–1.5)
Carboxyhemoglobin: 7.2 % (ref 0.5–1.5)
Methemoglobin: 0.9 % (ref 0.0–1.5)
Methemoglobin: <0.7 % (ref 0.0–1.5)
O2 Saturation: 100 %
O2 Saturation: 99.3 %
Total hemoglobin: 13 g/dL (ref 12.0–16.0)
Total hemoglobin: 13.4 g/dL (ref 12.0–16.0)

## 2022-06-18 LAB — RAPID URINE DRUG SCREEN, HOSP PERFORMED
Amphetamines: NOT DETECTED
Barbiturates: NOT DETECTED
Benzodiazepines: POSITIVE — AB
Cocaine: NOT DETECTED
Opiates: NOT DETECTED
Tetrahydrocannabinol: POSITIVE — AB

## 2022-06-18 LAB — BLOOD GAS, VENOUS
Acid-Base Excess: 3.9 mmol/L — ABNORMAL HIGH (ref 0.0–2.0)
Bicarbonate: 34.8 mmol/L — ABNORMAL HIGH (ref 20.0–28.0)
O2 Saturation: 99.8 %
Patient temperature: 37
pCO2, Ven: 87 mmHg (ref 44–60)
pH, Ven: 7.21 — ABNORMAL LOW (ref 7.25–7.43)
pO2, Ven: 167 mmHg — ABNORMAL HIGH (ref 32–45)

## 2022-06-18 LAB — MRSA NEXT GEN BY PCR, NASAL: MRSA by PCR Next Gen: NOT DETECTED

## 2022-06-18 LAB — GLUCOSE, CAPILLARY: Glucose-Capillary: 190 mg/dL — ABNORMAL HIGH (ref 70–99)

## 2022-06-18 LAB — ETHANOL: Alcohol, Ethyl (B): <10 mg/dL (ref <10)

## 2022-06-18 LAB — HCG, QUANTITATIVE, PREGNANCY: hCG, Beta Chain, Quant, S: <1 m[IU]/mL (ref <5)

## 2022-06-18 MED ORDER — ORAL CARE MOUTH RINSE: 15.0000 mL | OROMUCOSAL | Status: DC | PRN

## 2022-06-18 MED ORDER — OLANZAPINE 5 MG PO TABS
10.0000 mg | ORAL_TABLET | Freq: Every day | ORAL | Status: DC
  Administered 2022-06-18 – 2022-06-21 (×4): 10 mg via ORAL
  Filled 2022-06-18 (×2): qty 1
  Filled 2022-06-18: qty 2
  Filled 2022-06-18: qty 1

## 2022-06-18 MED ORDER — ARFORMOTEROL TARTRATE 15 MCG/2ML IN NEBU
15.0000 ug | INHALATION_SOLUTION | Freq: Two times a day (BID) | RESPIRATORY_TRACT | Status: DC
  Administered 2022-06-18 – 2022-06-22 (×9): 15 ug via RESPIRATORY_TRACT
  Filled 2022-06-18 (×10): qty 2

## 2022-06-18 MED ORDER — ALBUTEROL SULFATE (2.5 MG/3ML) 0.083% IN NEBU
2.5000 mg | INHALATION_SOLUTION | RESPIRATORY_TRACT | Status: DC | PRN
  Administered 2022-06-20: 2.5 mg via RESPIRATORY_TRACT
  Filled 2022-06-18 (×2): qty 3

## 2022-06-18 MED ORDER — BUSPIRONE HCL 10 MG PO TABS
10.0000 mg | ORAL_TABLET | Freq: Three times a day (TID) | ORAL | Status: DC
  Administered 2022-06-18 – 2022-06-22 (×12): 10 mg via ORAL
  Filled 2022-06-18 (×13): qty 1

## 2022-06-18 MED ORDER — ONDANSETRON HCL 4 MG/2ML IJ SOLN: 4.0000 mg | Freq: Four times a day (QID) | INTRAMUSCULAR | Status: DC | PRN

## 2022-06-18 MED ORDER — LOSARTAN POTASSIUM 50 MG PO TABS
50.0000 mg | ORAL_TABLET | Freq: Every day | ORAL | Status: DC
  Administered 2022-06-19 – 2022-06-22 (×4): 50 mg via ORAL
  Filled 2022-06-18 (×4): qty 1

## 2022-06-18 MED ORDER — ENOXAPARIN SODIUM 40 MG/0.4ML IJ SOSY
40.0000 mg | PREFILLED_SYRINGE | INTRAMUSCULAR | Status: DC
  Administered 2022-06-18 – 2022-06-22 (×5): 40 mg via SUBCUTANEOUS
  Filled 2022-06-18 (×5): qty 0.4

## 2022-06-18 MED ORDER — ACETAMINOPHEN 325 MG PO TABS: 650.0000 mg | ORAL_TABLET | Freq: Four times a day (QID) | ORAL | Status: DC | PRN

## 2022-06-18 MED ORDER — ORAL CARE MOUTH RINSE
15.0000 mL | OROMUCOSAL | Status: DC
  Administered 2022-06-19 – 2022-06-22 (×8): 15 mL via OROMUCOSAL

## 2022-06-18 MED ORDER — SENNOSIDES-DOCUSATE SODIUM 8.6-50 MG PO TABS: 1.0000 | ORAL_TABLET | Freq: Every evening | ORAL | Status: DC | PRN

## 2022-06-18 MED ORDER — BENZONATATE 100 MG PO CAPS
200.0000 mg | ORAL_CAPSULE | Freq: Three times a day (TID) | ORAL | Status: DC | PRN
  Administered 2022-06-19 – 2022-06-20 (×3): 200 mg via ORAL
  Filled 2022-06-18 (×3): qty 2

## 2022-06-18 MED ORDER — DICYCLOMINE HCL 10 MG PO CAPS
10.0000 mg | ORAL_CAPSULE | Freq: Three times a day (TID) | ORAL | Status: DC
  Administered 2022-06-18 – 2022-06-22 (×13): 10 mg via ORAL
  Filled 2022-06-18 (×12): qty 1

## 2022-06-18 MED ORDER — SODIUM CHLORIDE 0.9 % IV SOLN
500.0000 mg | INTRAVENOUS | Status: DC
  Administered 2022-06-18 – 2022-06-20 (×3): 500 mg via INTRAVENOUS
  Filled 2022-06-18 (×3): qty 5

## 2022-06-18 MED ORDER — CHLORHEXIDINE GLUCONATE CLOTH 2 % EX PADS
6.0000 | MEDICATED_PAD | Freq: Every day | CUTANEOUS | Status: DC
  Administered 2022-06-18 – 2022-06-20 (×3): 6 via TOPICAL

## 2022-06-18 MED ORDER — ONDANSETRON HCL 4 MG PO TABS: 4.0000 mg | ORAL_TABLET | Freq: Four times a day (QID) | ORAL | Status: DC | PRN

## 2022-06-18 MED ORDER — VENLAFAXINE HCL ER 37.5 MG PO CP24
37.5000 mg | ORAL_CAPSULE | Freq: Every day | ORAL | Status: DC
  Administered 2022-06-19 – 2022-06-22 (×4): 37.5 mg via ORAL
  Filled 2022-06-18 (×5): qty 1

## 2022-06-18 MED ORDER — IPRATROPIUM BROMIDE 0.02 % IN SOLN
0.5000 mg | Freq: Once | RESPIRATORY_TRACT | Status: AC
  Administered 2022-06-18: 0.5 mg via RESPIRATORY_TRACT
  Filled 2022-06-18: qty 2.5

## 2022-06-18 MED ORDER — METHYLPREDNISOLONE SODIUM SUCC 40 MG IJ SOLR
40.0000 mg | Freq: Two times a day (BID) | INTRAMUSCULAR | Status: DC
  Administered 2022-06-18 – 2022-06-22 (×10): 40 mg via INTRAVENOUS
  Filled 2022-06-18 (×10): qty 1

## 2022-06-18 MED ORDER — PANTOPRAZOLE SODIUM 40 MG PO TBEC
40.0000 mg | DELAYED_RELEASE_TABLET | Freq: Every day | ORAL | Status: DC
  Administered 2022-06-19 – 2022-06-22 (×4): 40 mg via ORAL
  Filled 2022-06-18 (×4): qty 1

## 2022-06-18 MED ORDER — NICOTINE 14 MG/24HR TD PT24
14.0000 mg | MEDICATED_PATCH | Freq: Every day | TRANSDERMAL | Status: DC
  Administered 2022-06-18 – 2022-06-22 (×6): 14 mg via TRANSDERMAL
  Filled 2022-06-18 (×6): qty 1

## 2022-06-18 MED ORDER — BUDESONIDE 0.25 MG/2ML IN SUSP
0.2500 mg | Freq: Two times a day (BID) | RESPIRATORY_TRACT | Status: DC
  Administered 2022-06-18 – 2022-06-22 (×9): 0.25 mg via RESPIRATORY_TRACT
  Filled 2022-06-18 (×9): qty 2

## 2022-06-18 MED ORDER — REVEFENACIN 175 MCG/3ML IN SOLN
175.0000 ug | Freq: Every day | RESPIRATORY_TRACT | Status: DC
  Administered 2022-06-18 – 2022-06-22 (×5): 175 ug via RESPIRATORY_TRACT
  Filled 2022-06-18 (×6): qty 3

## 2022-06-18 MED ORDER — FUROSEMIDE 10 MG/ML IJ SOLN
40.0000 mg | Freq: Once | INTRAMUSCULAR | Status: AC
  Administered 2022-06-18: 40 mg via INTRAVENOUS
  Filled 2022-06-18: qty 4

## 2022-06-18 MED ORDER — SODIUM CHLORIDE 0.9 % IV SOLN
2.0000 g | INTRAVENOUS | Status: DC
  Administered 2022-06-18 – 2022-06-22 (×5): 2 g via INTRAVENOUS
  Filled 2022-06-18 (×5): qty 20

## 2022-06-18 MED ORDER — LEVALBUTEROL HCL 1.25 MG/0.5ML IN NEBU
1.2500 mg | INHALATION_SOLUTION | Freq: Once | RESPIRATORY_TRACT | Status: AC
  Administered 2022-06-18: 1.25 mg via RESPIRATORY_TRACT
  Filled 2022-06-18: qty 0.5

## 2022-06-18 MED ORDER — METHOCARBAMOL 500 MG PO TABS
500.0000 mg | ORAL_TABLET | Freq: Four times a day (QID) | ORAL | Status: DC | PRN
  Administered 2022-06-22: 500 mg via ORAL
  Filled 2022-06-18: qty 1

## 2022-06-18 MED ORDER — ACETAMINOPHEN 650 MG RE SUPP: 650.0000 mg | Freq: Four times a day (QID) | RECTAL | Status: DC | PRN

## 2022-06-18 MED ORDER — MONTELUKAST SODIUM 10 MG PO TABS
10.0000 mg | ORAL_TABLET | Freq: Every day | ORAL | Status: DC
  Administered 2022-06-18 – 2022-06-21 (×4): 10 mg via ORAL
  Filled 2022-06-18 (×4): qty 1
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
## 2022-06-18 NOTE — ED Notes (Signed)
ED TO INPATIENT HANDOFF REPORT  ED Nurse Name and Phone #: Julian Reil [phone]  S Name/Age/Gender [name] 42 y.o. female Room/Bed: WA18/WA18  Code Status   Code Status: Prior  Home/SNF/Other Home Patient oriented to: self, place, time, and situation Is this baseline? Yes   Triage Complete: Triage complete  Chief Complaint Acute respiratory failure with hypoxemia (HCC) [J96.01]  Triage Note Pt BIB EMS c/o shortness of breath hx ashtma, flu a few weeks ago. Pt 60% on room air on EMS arrival 15g Albuterol 2g Mag 125 Solumedrol 20g left forearm    Allergies Allergies  Allergen Reactions   Alprazolam Itching   Oxycodone Itching   Penicillins Nausea And Vomiting and Other (See Comments)    Has patient had a PCN reaction causing immediate rash, facial/tongue/throat swelling, SOB or lightheadedness with hypotension:No Has patient had a PCN reaction causing severe rash involving mucus membranes or skin necrosis:No Has patient had a PCN reaction that REACTION THAT REQUIRED HOSPITALIZATION:  #  #  #  YES  #  #  #  Has patient had a PCN reaction occurring within the last 10 years: #  #  #  YES  #  #  #    Norvasc [Amlodipine Besylate] Other (See Comments)    Tip of tongue numb and flushing    Level of Care/Admitting Diagnosis ED Disposition     ED Disposition  Admit   Condition  --   Comment  Hospital Area: [facility] [[id number]]  Level of Care: Telemetry [5]  Admit to tele based on following criteria: Other see comments  Comments: arrythmia  May place patient in observation at [facility] or [facility] if equivalent level of care is available:: Yes  Covid Evaluation: Confirmed COVID Negative  Diagnosis: Acute respiratory failure with hypoxemia [facility]) [[id number]]  Admitting Physician: Gery Pray [[id number]]  Attending Physician: Gery Pray [[id number]]          B Medical/Surgery History Past Medical History:  Diagnosis Date   Anxiety     Asthma    Depression    doing good, not on meds now   GERD (gastroesophageal reflux disease)    Headache    migraines   History of kidney stones    Hypertension    Kidney stones    Missed abortion 10/02/2013   Ovarian cyst    Pregnancy induced hypertension    Suicidal intent    Vaginal Pap smear, abnormal    bx, f/u ok   Past Surgical History:  Procedure Laterality Date   CESAREAN SECTION     CESAREAN SECTION N/A 06/06/2015   Procedure: CESAREAN SECTION;  Surgeon: Kathreen Cosier, MD;  Location: [facility] ORS;  Service: Obstetrics;  Laterality: N/A;   CHOLECYSTECTOMY N/A 08/23/2016   Procedure: LAPAROSCOPIC CHOLECYSTECTOMY;  Surgeon: Berna Bue, MD;  Location: [facility] OR;  Service: General;  Laterality: N/A;   DILATION AND CURETTAGE OF UTERUS     DILATION AND EVACUATION N/A 10/04/2013   Procedure: DILATATION AND EVACUATION;  Surgeon: Sherian Rein, MD;  Location: [facility]S;  Service: Gynecology;  Laterality: N/A;  [place] in room    TONSILLECTOMY     TUBAL LIGATION       A IV Location/Drains/Wounds Patient Lines/Drains/Airways Status     Active Line/Drains/Airways     None            Intake/Output Last 24 hours No intake or output data in the 24 hours ending 06/18/22 0310  Labs/Imaging Results for orders placed or performed during the hospital encounter of 06/17/22 (from the past 48 hour(s))  .Cooxemetry Panel (carboxy, met, total hgb, O2 sat)     Status: Abnormal   Collection Time: 06/17/22 11:54 PM  Result Value Ref Range   Total hemoglobin 13.0 12.0 - 16.0 g/dL   O2 Saturation 100 %   Carboxyhemoglobin 7.2 (HH) 0.5 - 1.5 %    Comment: CRITICAL RESULT CALLED TO, READ BACK BY AND VERIFIED WITH: PORTEZ,J RN AT 0034 ON 06/18/22 BY [email or web address]    Methemoglobin <0.7 0.0 - 1.5 %    Comment: Performed at [facility], [place] [address] [place], [place] [address]  Resp panel by RT-PCR (RSV, Flu A&B, Covid) Anterior Nasal Swab     Status: None   Collection  Time: 06/17/22 11:54 PM   Specimen: Anterior Nasal Swab  Result Value Ref Range   SARS Coronavirus 2 by RT PCR NEGATIVE NEGATIVE    Comment: (NOTE) SARS-CoV-2 target nucleic acids are NOT DETECTED.  The SARS-CoV-2 RNA is generally detectable in upper respiratory specimens during the acute phase of infection. The lowest concentration of SARS-CoV-2 viral copies this assay can detect is 138 copies/mL. A negative result does not preclude SARS-Cov-2 infection and should not be used as the sole basis for treatment or other patient management decisions. A negative result may occur with  improper specimen collection/handling, submission of specimen other than nasopharyngeal swab, presence of viral mutation(s) within the areas targeted by this assay, and inadequate number of viral copies(<138 copies/mL). A negative result must be combined with clinical observations, patient history, and epidemiological information. The expected result is Negative.  Fact Sheet for Patients:  EntrepreneurPulse.com.au  Fact Sheet for Healthcare Providers:  IncredibleEmployment.be  This test is no t yet approved or cleared by the [place] FDA and  has been authorized for detection and/or diagnosis of SARS-CoV-2 by FDA under an Emergency Use Authorization (EUA). This EUA will remain  in effect (meaning this test can be used) for the duration of the COVID-19 declaration under Section 564(b)(1) of the Act, 21 U.S.C.section 360bbb-3(b)(1), unless the authorization is terminated  or revoked sooner.       Influenza A by PCR NEGATIVE NEGATIVE   Influenza B by PCR NEGATIVE NEGATIVE    Comment: (NOTE) The Xpert Xpress SARS-CoV-2/FLU/RSV plus assay is intended as an aid in the diagnosis of influenza from Nasopharyngeal swab specimens and should not be used as a sole basis for treatment. Nasal washings and aspirates are unacceptable for Xpert Xpress  SARS-CoV-2/FLU/RSV testing.  Fact Sheet for Patients: EntrepreneurPulse.com.au  Fact Sheet for Healthcare Providers: IncredibleEmployment.be  This test is not yet approved or cleared by the [place] FDA and has been authorized for detection and/or diagnosis of SARS-CoV-2 by FDA under an Emergency Use Authorization (EUA). This EUA will remain in effect (meaning this test can be used) for the duration of the COVID-19 declaration under Section 564(b)(1) of the Act, 21 U.S.C. section 360bbb-3(b)(1), unless the authorization is terminated or revoked.     Resp Syncytial Virus by PCR NEGATIVE NEGATIVE    Comment: (NOTE) Fact Sheet for Patients: EntrepreneurPulse.com.au  Fact Sheet for Healthcare Providers: IncredibleEmployment.be  This test is not yet approved or cleared by the [place] FDA and has been authorized for detection and/or diagnosis of SARS-CoV-2 by FDA under an Emergency Use Authorization (EUA). This EUA will remain in effect (meaning this test can be used) for the duration of the COVID-19 declaration  under Section 564(b)(1) of the Act, 21 U.S.C. section 360bbb-3(b)(1), unless the authorization is terminated or revoked.  Performed at [facility], [place] [address], [place], [place] [address]   Comprehensive metabolic panel     Status: Abnormal   Collection Time: 06/17/22 11:54 PM  Result Value Ref Range   Sodium 137 135 - 145 mmol/L   Potassium 4.3 3.5 - 5.1 mmol/L   Chloride 97 (L) 98 - 111 mmol/L   CO2 32 22 - 32 mmol/L   Glucose, Bld 130 (H) 70 - 99 mg/dL    Comment: Glucose reference range applies only to samples taken after fasting for at least 8 hours.   BUN 10 6 - 20 mg/dL   Creatinine, Ser 1.02 (H) 0.44 - 1.00 mg/dL   Calcium 8.5 (L) 8.9 - 10.3 mg/dL   Total Protein 5.8 (L) 6.5 - 8.1 g/dL   Albumin 3.5 3.5 - 5.0 g/dL   AST 37 15 - 41 U/L   ALT 30 0 - 44  U/L   Alkaline Phosphatase 79 38 - 126 U/L   Total Bilirubin 0.2 (L) 0.3 - 1.2 mg/dL   GFR, Estimated >60 >60 mL/min    Comment: (NOTE) Calculated using the CKD-EPI Creatinine Equation (2021)    Anion gap 8 5 - 15    Comment: Performed at [facility], [place] [address] [place], [place] [address]  CBC     Status: Abnormal   Collection Time: 06/17/22 11:54 PM  Result Value Ref Range   WBC 7.5 4.0 - 10.5 K/uL   RBC 4.08 3.87 - 5.11 MIL/uL   Hemoglobin 12.7 12.0 - 15.0 g/dL   HCT 42.7 36.0 - 46.0 %   MCV 104.7 (H) 80.0 - 100.0 fL   MCH 31.1 26.0 - 34.0 pg   MCHC 29.7 (L) 30.0 - 36.0 g/dL   RDW 15.8 (H) 11.5 - 15.5 %   Platelets 254 150 - 400 K/uL   nRBC 0.0 0.0 - 0.2 %    Comment: Performed at [facility], [place] [address], [place], [place] [address]  Blood gas, venous     Status: Abnormal   Collection Time: 06/17/22 11:54 PM  Result Value Ref Range   pH, Ven 7.21 (L) 7.25 - 7.43   pCO2, Ven 87 (HH) 44 - 60 mmHg    Comment: CRITICAL RESULT CALLED TO, READ BACK BY AND VERIFIED WITH: PORTEZ,J RN AT 0034 ON 06/18/22 BY [email or web address]    pO2, Ven 167 (H) 32 - 45 mmHg   Bicarbonate 34.8 (H) 20.0 - 28.0 mmol/L   Acid-Base Excess 3.9 (H) 0.0 - 2.0 mmol/L   O2 Saturation 99.8 %   Patient temperature 37.0     Comment: Performed at [facility], [place] [address] [place],  [address]  hCG, quantitative, pregnancy     Status: None   Collection Time: 06/17/22 11:54 PM  Result Value Ref Range   hCG, Beta Chain, Quant, S <1 <5 mIU/mL    Comment:          GEST. AGE      CONC.  (mIU/mL)   <=1 WEEK        5 - 50     2 WEEKS       50 - 500     3 WEEKS       100 - 10,000     4 WEEKS     1,000 - 30,000  5 WEEKS     3,500 - 115,000   6-8 WEEKS     12,000 - 270,000    12 WEEKS     15,000 - 220,000        FEMALE AND NON-PREGNANT FEMALE:     LESS THAN 5 mIU/mL Performed at [facility], [place] [address], [place],  [place] [address]    DG Chest Port 1 View  Result Date: 06/18/2022 CLINICAL DATA:  Shortness of breath. EXAM: PORTABLE CHEST 1 VIEW COMPARISON:  May 24, 2022 FINDINGS: The study is limited secondary to patient positioning. The heart size and mediastinal contours are within normal limits. Low lung volumes are noted. Mild atelectasis and/or infiltrate is seen within the left lung base. There is no evidence of a pleural effusion or pneumothorax. The visualized skeletal structures are unremarkable. IMPRESSION: Low lung volumes with mild left basilar atelectasis and/or infiltrate. Electronically Signed   By: Virgina Norfolk M.D.   On: 06/18/2022 00:03    Pending Labs Unresulted Labs (From admission, onward)     Start     Ordered   06/18/22 0600  .Cooxemetry Panel (carboxy, met, total hgb, O2 sat)  Once,   R        06/18/22 0251            Vitals/Pain Today's Vitals   06/18/22 0105 06/18/22 0130 06/18/22 0221 06/18/22 0222  BP: (!) 112/58 (!) 118/56    Pulse: (!) 112 (!) 107 (!) 109 (!) 108  Resp: (!) 22 15 (!) 24 (!) 22  Temp:      TempSrc:      SpO2: (!) 81% 100% 97% 96%  Weight:      Height:      PainSc:        Isolation Precautions No active isolations  Medications Medications  nicotine (NICODERM CQ - dosed in mg/24 hours) patch 14 mg (has no administration in time range)  methylPREDNISolone sodium succinate (SOLU-MEDROL) 40 mg/mL injection 40 mg (has no administration in time range)  albuterol (PROVENTIL) (2.5 MG/3ML) 0.083% nebulizer solution 2.5 mg (has no administration in time range)  benzonatate (TESSALON) capsule 200 mg (has no administration in time range)  busPIRone (BUSPAR) tablet 10 mg (has no administration in time range)  dicyclomine (BENTYL) capsule 10 mg (has no administration in time range)  losartan (COZAAR) tablet 50 mg (has no administration in time range)  montelukast (SINGULAIR) tablet 10 mg (has no administration in time range)  OLANZapine (ZYPREXA)  tablet 10 mg (has no administration in time range)  pantoprazole (PROTONIX) EC tablet 40 mg (has no administration in time range)  venlafaxine XR (EFFEXOR-XR) 24 hr capsule 37.5 mg (has no administration in time range)  methocarbamol (ROBAXIN) tablet 500 mg (has no administration in time range)  levalbuterol (XOPENEX) nebulizer solution 1.25 mg (1.25 mg Nebulization Given 06/18/22 0148)  ipratropium (ATROVENT) nebulizer solution 0.5 mg (0.5 mg Nebulization Given 06/18/22 0148)    Mobility walks     Focused Assessments    R Recommendations: See Admitting Provider Note  Report given to:   Additional Notes:
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
## 2022-06-19 DIAGNOSIS — Z880 Allergy status to penicillin: Secondary | ICD-10-CM | POA: Diagnosis not present

## 2022-06-19 DIAGNOSIS — Z885 Allergy status to narcotic agent status: Secondary | ICD-10-CM | POA: Diagnosis not present

## 2022-06-19 DIAGNOSIS — Z77028 Contact with and (suspected) exposure to other hazardous aromatic compounds: Secondary | ICD-10-CM | POA: Diagnosis present

## 2022-06-19 DIAGNOSIS — J9602 Acute respiratory failure with hypercapnia: Secondary | ICD-10-CM | POA: Diagnosis present

## 2022-06-19 DIAGNOSIS — Z79899 Other long term (current) drug therapy: Secondary | ICD-10-CM | POA: Diagnosis not present

## 2022-06-19 DIAGNOSIS — Y92009 Unspecified place in unspecified non-institutional (private) residence as the place of occurrence of the external cause: Secondary | ICD-10-CM | POA: Diagnosis not present

## 2022-06-19 DIAGNOSIS — F1010 Alcohol abuse, uncomplicated: Secondary | ICD-10-CM | POA: Diagnosis present

## 2022-06-19 DIAGNOSIS — J4551 Severe persistent asthma with (acute) exacerbation: Secondary | ICD-10-CM | POA: Diagnosis not present

## 2022-06-19 DIAGNOSIS — T59811A Toxic effect of smoke, accidental (unintentional), initial encounter: Secondary | ICD-10-CM | POA: Diagnosis not present

## 2022-06-19 DIAGNOSIS — Z7951 Long term (current) use of inhaled steroids: Secondary | ICD-10-CM | POA: Diagnosis not present

## 2022-06-19 DIAGNOSIS — Z87442 Personal history of urinary calculi: Secondary | ICD-10-CM | POA: Diagnosis not present

## 2022-06-19 DIAGNOSIS — J9601 Acute respiratory failure with hypoxia: Secondary | ICD-10-CM | POA: Diagnosis not present

## 2022-06-19 DIAGNOSIS — Z9049 Acquired absence of other specified parts of digestive tract: Secondary | ICD-10-CM | POA: Diagnosis not present

## 2022-06-19 DIAGNOSIS — F418 Other specified anxiety disorders: Secondary | ICD-10-CM | POA: Diagnosis not present

## 2022-06-19 DIAGNOSIS — G9341 Metabolic encephalopathy: Secondary | ICD-10-CM | POA: Diagnosis not present

## 2022-06-19 DIAGNOSIS — F17210 Nicotine dependence, cigarettes, uncomplicated: Secondary | ICD-10-CM | POA: Diagnosis present

## 2022-06-19 DIAGNOSIS — Z7729 Contact with and (suspected ) exposure to other hazardous substances: Secondary | ICD-10-CM | POA: Diagnosis not present

## 2022-06-19 DIAGNOSIS — I10 Essential (primary) hypertension: Secondary | ICD-10-CM | POA: Diagnosis present

## 2022-06-19 DIAGNOSIS — E8729 Other acidosis: Secondary | ICD-10-CM | POA: Diagnosis not present

## 2022-06-19 DIAGNOSIS — Z888 Allergy status to other drugs, medicaments and biological substances status: Secondary | ICD-10-CM | POA: Diagnosis not present

## 2022-06-19 DIAGNOSIS — E872 Acidosis, unspecified: Secondary | ICD-10-CM | POA: Diagnosis present

## 2022-06-19 DIAGNOSIS — J441 Chronic obstructive pulmonary disease with (acute) exacerbation: Secondary | ICD-10-CM | POA: Diagnosis present

## 2022-06-19 DIAGNOSIS — F419 Anxiety disorder, unspecified: Secondary | ICD-10-CM | POA: Diagnosis present

## 2022-06-19 DIAGNOSIS — F32A Depression, unspecified: Secondary | ICD-10-CM | POA: Diagnosis present

## 2022-06-19 DIAGNOSIS — Z1152 Encounter for screening for COVID-19: Secondary | ICD-10-CM | POA: Diagnosis not present

## 2022-06-19 DIAGNOSIS — Z822 Family history of deafness and hearing loss: Secondary | ICD-10-CM | POA: Diagnosis not present

## 2022-06-19 DIAGNOSIS — Z825 Family history of asthma and other chronic lower respiratory diseases: Secondary | ICD-10-CM | POA: Diagnosis not present

## 2022-06-19 LAB — BASIC METABOLIC PANEL
Anion gap: 8 (ref 5–15)
BUN: 13 mg/dL (ref 6–20)
CO2: 38 mmol/L — ABNORMAL HIGH (ref 22–32)
Calcium: 8.6 mg/dL — ABNORMAL LOW (ref 8.9–10.3)
Chloride: 90 mmol/L — ABNORMAL LOW (ref 98–111)
Creatinine, Ser: 0.89 mg/dL (ref 0.44–1.00)
GFR, Estimated: >60 mL/min (ref >60)
Glucose, Bld: 119 mg/dL — ABNORMAL HIGH (ref 70–99)
Potassium: 4.8 mmol/L (ref 3.5–5.1)
Sodium: 136 mmol/L (ref 135–145)

## 2022-06-19 LAB — CBC
HCT: 38.7 % (ref 36.0–46.0)
Hemoglobin: 11.3 g/dL — ABNORMAL LOW (ref 12.0–15.0)
MCH: 30.8 pg (ref 26.0–34.0)
MCHC: 29.2 g/dL — ABNORMAL LOW (ref 30.0–36.0)
MCV: 105.4 fL — ABNORMAL HIGH (ref 80.0–100.0)
Platelets: 255 10*3/uL (ref 150–400)
RBC: 3.67 MIL/uL — ABNORMAL LOW (ref 3.87–5.11)
RDW: 15 % (ref 11.5–15.5)
WBC: 8 10*3/uL (ref 4.0–10.5)
nRBC: 0 % (ref 0.0–0.2)

## 2022-06-19 LAB — MAGNESIUM: Magnesium: 1.9 mg/dL (ref 1.7–2.4)

## 2022-06-19 MED ORDER — ADULT MULTIVITAMIN W/MINERALS CH
1.0000 | ORAL_TABLET | Freq: Every day | ORAL | Status: DC
  Administered 2022-06-19 – 2022-06-22 (×4): 1 via ORAL
  Filled 2022-06-19 (×4): qty 1

## 2022-06-19 MED ORDER — THIAMINE MONONITRATE 100 MG PO TABS
100.0000 mg | ORAL_TABLET | Freq: Every day | ORAL | Status: DC
  Administered 2022-06-19 – 2022-06-22 (×4): 100 mg via ORAL
  Filled 2022-06-19 (×4): qty 1

## 2022-06-19 MED ORDER — LORAZEPAM 1 MG PO TABS
1.0000 mg | ORAL_TABLET | ORAL | Status: AC | PRN
  Administered 2022-06-19 – 2022-06-21 (×3): 1 mg via ORAL
  Filled 2022-06-19 (×2): qty 1
  Filled 2022-06-19: qty 2

## 2022-06-19 MED ORDER — FOLIC ACID 1 MG PO TABS
1.0000 mg | ORAL_TABLET | Freq: Every day | ORAL | Status: DC
  Administered 2022-06-19 – 2022-06-22 (×4): 1 mg via ORAL
  Filled 2022-06-19 (×4): qty 1

## 2022-06-19 MED ORDER — LORAZEPAM 0.5 MG PO TABS
0.5000 mg | ORAL_TABLET | Freq: Four times a day (QID) | ORAL | Status: AC | PRN
  Administered 2022-06-21: 0.5 mg via ORAL
  Filled 2022-06-19 (×2): qty 1

## 2022-06-19 MED ORDER — IBUPROFEN 200 MG PO TABS
600.0000 mg | ORAL_TABLET | Freq: Four times a day (QID) | ORAL | Status: DC | PRN
  Administered 2022-06-19 – 2022-06-20 (×2): 600 mg via ORAL
  Filled 2022-06-19 (×2): qty 3
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
## 2022-06-19 NOTE — Progress Notes (Signed)
Pt seen and given scheduled nebulizer treatment which she tolerated well.  BR83, rr19, spo2 97% on 7L hfnc.  Pt is awake, alert, and does not want to wear bipap at this time.  RN aware, present in room.
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
## 2022-06-20 DIAGNOSIS — J441 Chronic obstructive pulmonary disease with (acute) exacerbation: Secondary | ICD-10-CM | POA: Diagnosis not present

## 2022-06-20 DIAGNOSIS — J4551 Severe persistent asthma with (acute) exacerbation: Secondary | ICD-10-CM | POA: Diagnosis not present

## 2022-06-20 DIAGNOSIS — Z7729 Contact with and (suspected ) exposure to other hazardous substances: Secondary | ICD-10-CM | POA: Diagnosis not present

## 2022-06-20 DIAGNOSIS — J9601 Acute respiratory failure with hypoxia: Secondary | ICD-10-CM | POA: Diagnosis not present

## 2022-06-20 LAB — BASIC METABOLIC PANEL
Anion gap: 8 (ref 5–15)
BUN: 19 mg/dL (ref 6–20)
CO2: 38 mmol/L — ABNORMAL HIGH (ref 22–32)
Calcium: 8.8 mg/dL — ABNORMAL LOW (ref 8.9–10.3)
Chloride: 92 mmol/L — ABNORMAL LOW (ref 98–111)
Creatinine, Ser: 0.9 mg/dL (ref 0.44–1.00)
GFR, Estimated: >60 mL/min (ref >60)
Glucose, Bld: 119 mg/dL — ABNORMAL HIGH (ref 70–99)
Potassium: 4.7 mmol/L (ref 3.5–5.1)
Sodium: 138 mmol/L (ref 135–145)

## 2022-06-20 LAB — CBC
HCT: 42.2 % (ref 36.0–46.0)
Hemoglobin: 12.9 g/dL (ref 12.0–15.0)
MCH: 31.5 pg (ref 26.0–34.0)
MCHC: 30.6 g/dL (ref 30.0–36.0)
MCV: 103.2 fL — ABNORMAL HIGH (ref 80.0–100.0)
Platelets: 291 10*3/uL (ref 150–400)
RBC: 4.09 MIL/uL (ref 3.87–5.11)
RDW: 14.9 % (ref 11.5–15.5)
WBC: 6 10*3/uL (ref 4.0–10.5)
nRBC: 0 % (ref 0.0–0.2)

## 2022-06-20 MED ORDER — GUAIFENESIN ER 600 MG PO TB12
1200.0000 mg | ORAL_TABLET | Freq: Two times a day (BID) | ORAL | Status: DC
  Administered 2022-06-20 – 2022-06-22 (×4): 1200 mg via ORAL
  Filled 2022-06-20 (×4): qty 2

## 2022-06-20 MED ORDER — AZITHROMYCIN 250 MG PO TABS
500.0000 mg | ORAL_TABLET | Freq: Every day | ORAL | Status: DC
  Administered 2022-06-21 – 2022-06-22 (×2): 500 mg via ORAL
  Filled 2022-06-20 (×2): qty 2

## 2022-06-20 MED ORDER — GUAIFENESIN-DM 100-10 MG/5ML PO SYRP
5.0000 mL | ORAL_SOLUTION | ORAL | Status: DC | PRN
  Administered 2022-06-20: 5 mL via ORAL
  Filled 2022-06-20: qty 10

## 2022-06-20 NOTE — Progress Notes (Signed)
PHARMACIST - PHYSICIAN COMMUNICATION DR:   Lonny Prude CONCERNING: Antibiotic IV to Oral Route Change Policy  RECOMMENDATION: This patient is receiving azithromycin by the intravenous route.  Based on criteria approved by the Pharmacy and Therapeutics Committee, the antibiotic(s) is/are being converted to the equivalent oral dose form(s).   DESCRIPTION: These criteria include: Patient being treated for a respiratory tract infection, urinary tract infection, cellulitis or clostridium difficile associated diarrhea if on metronidazole The patient is not neutropenic and does not exhibit a GI malabsorption state The patient is eating (either orally or via tube) and/or has been taking other orally administered medications for a least 24 hours The patient is improving clinically and has a Tmax < 100.5  If you have questions about this conversion, please contact the Pharmacy Department  []   [phone] )  [facility] []   [phone] )  [facility] []   [phone] )  [facility] []   [phone] )  [facility] [x]   [phone] )  [place], PharmD, BCPS 06/20/2022 11:14 AM
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
## 2022-06-20 NOTE — Progress Notes (Signed)
Patient was decreased to a 4L [place]. She appears to be tolerating well at this time with no distress.

## 2022-06-20 NOTE — TOC Progression Note (Signed)
Transition of Care [facility]) - Progression Note    Patient Details  Name: [name] MRN: [id number] Date of Birth: Jul 02, 1980  Transition of Care [facility]) CM/SW Contact  Jaythan Hinely, Juliann Pulse, RN Phone Number: 06/20/2022, 1:06 PM  Clinical Narrative: Spoke to patient about giving same SA resources to [name]-she agreed;left additional list in rm.      Expected Discharge Plan: Home/Self Care Barriers to Discharge: Continued Medical Work up  Expected Discharge Plan and Services   Discharge Planning Services: CM Consult   Living arrangements for the past 2 months: Single Family Home                                       Social Determinants of Health (SDOH) Interventions [place]: No Food Insecurity (05/24/2022)  Housing: Low Risk  (05/24/2022)  Transportation Needs: No Transportation Needs (05/24/2022)  Utilities: Not At Risk (05/24/2022)  Alcohol Screen: Low Risk  (07/20/2017)  Depression (PHQ2-9): Medium Risk (01/30/2022)  Tobacco Use: High Risk (06/07/2022)    Readmission Risk Interventions    06/19/2022    3:22 PM 05/17/2022   12:29 PM 08/31/2021    1:52 PM  Readmission Risk Prevention Plan  Post Dischage Appt   Complete  Medication Screening   Complete  Transportation Screening Complete Complete Complete  PCP or Specialist Appt within 5-7 Days  Complete   PCP or Specialist Appt within 3-5 Days Complete    Home Care Screening  Complete   Medication Review (RN CM)  Complete   HRI or Home Care Consult Complete    Social Work Consult for [place] Planning/Counseling Complete    Palliative Care Screening Not Applicable    Medication Review [employer] photographer) Complete

## 2022-06-21 ENCOUNTER — Encounter (HOSPITAL_COMMUNITY): Payer: Self-pay | Admitting: Internal Medicine

## 2022-06-21 DIAGNOSIS — J441 Chronic obstructive pulmonary disease with (acute) exacerbation: Secondary | ICD-10-CM | POA: Diagnosis not present

## 2022-06-21 DIAGNOSIS — J9601 Acute respiratory failure with hypoxia: Secondary | ICD-10-CM | POA: Diagnosis not present

## 2022-06-21 DIAGNOSIS — J4551 Severe persistent asthma with (acute) exacerbation: Secondary | ICD-10-CM | POA: Diagnosis not present

## 2022-06-21 DIAGNOSIS — Z7729 Contact with and (suspected ) exposure to other hazardous substances: Secondary | ICD-10-CM | POA: Diagnosis not present

## 2022-06-21 LAB — BASIC METABOLIC PANEL
Anion gap: 8 (ref 5–15)
BUN: 16 mg/dL (ref 6–20)
CO2: 35 mmol/L — ABNORMAL HIGH (ref 22–32)
Calcium: 8.6 mg/dL — ABNORMAL LOW (ref 8.9–10.3)
Chloride: 96 mmol/L — ABNORMAL LOW (ref 98–111)
Creatinine, Ser: 0.87 mg/dL (ref 0.44–1.00)
GFR, Estimated: >60 mL/min (ref >60)
Glucose, Bld: 137 mg/dL — ABNORMAL HIGH (ref 70–99)
Potassium: 4.1 mmol/L (ref 3.5–5.1)
Sodium: 139 mmol/L (ref 135–145)

## 2022-06-21 LAB — BLOOD GAS, VENOUS
Acid-Base Excess: 15.1 mmol/L — ABNORMAL HIGH (ref 0.0–2.0)
Bicarbonate: 43.4 mmol/L — ABNORMAL HIGH (ref 20.0–28.0)
O2 Saturation: 91.4 %
Patient temperature: 36.3
pCO2, Ven: 68 mmHg — ABNORMAL HIGH (ref 44–60)
pH, Ven: 7.41 (ref 7.25–7.43)
pO2, Ven: 53 mmHg — ABNORMAL HIGH (ref 32–45)

## 2022-06-21 NOTE — Progress Notes (Signed)
Report given. Belongings collected. Pt stable at this time
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
## 2022-06-21 NOTE — Plan of Care (Signed)
  Problem: Education: Goal: Knowledge of General Education information will improve Description: Including pain rating scale, medication(s)/side effects and non-pharmacologic comfort measures Outcome: Completed/Met   

## 2022-06-22 DIAGNOSIS — J9601 Acute respiratory failure with hypoxia: Secondary | ICD-10-CM | POA: Diagnosis not present

## 2022-06-22 LAB — BASIC METABOLIC PANEL
Anion gap: 10 (ref 5–15)
BUN: 18 mg/dL (ref 6–20)
CO2: 32 mmol/L (ref 22–32)
Calcium: 9 mg/dL (ref 8.9–10.3)
Chloride: 96 mmol/L — ABNORMAL LOW (ref 98–111)
Creatinine, Ser: 0.92 mg/dL (ref 0.44–1.00)
GFR, Estimated: >60 mL/min (ref >60)
Glucose, Bld: 124 mg/dL — ABNORMAL HIGH (ref 70–99)
Potassium: 4.3 mmol/L (ref 3.5–5.1)
Sodium: 138 mmol/L (ref 135–145)

## 2022-06-22 LAB — BLOOD GAS, VENOUS
Acid-Base Excess: 13 mmol/L — ABNORMAL HIGH (ref 0.0–2.0)
Bicarbonate: 38.7 mmol/L — ABNORMAL HIGH (ref 20.0–28.0)
O2 Saturation: 91 %
Patient temperature: 36.7
pCO2, Ven: 51 mmHg (ref 44–60)
pH, Ven: 7.48 — ABNORMAL HIGH (ref 7.25–7.43)
pO2, Ven: 54 mmHg — ABNORMAL HIGH (ref 32–45)

## 2022-06-22 MED ORDER — HYDROXYZINE PAMOATE 25 MG PO CAPS: 25.0000 mg | ORAL_CAPSULE | Freq: Three times a day (TID) | ORAL | 0 refills | Status: AC | PRN

## 2022-06-22 MED ORDER — PREDNISONE 10 MG PO TABS: ORAL_TABLET | ORAL | 0 refills | Status: AC

## 2022-06-22 MED ORDER — LORATADINE 10 MG PO TABS: 10.0000 mg | ORAL_TABLET | Freq: Every day | ORAL | 2 refills | Status: DC
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
## 2022-06-22 NOTE — Plan of Care (Signed)

## 2022-06-22 NOTE — Progress Notes (Signed)
SATURATION QUALIFICATIONS: (This note is used to comply with regulatory documentation for home oxygen)  Patient Saturations on Room Air at Rest = 90%  Patient Saturations on Room Air while Ambulating = 88%  Patient Saturations on 2 Liters of oxygen while Ambulating = 92%  Please briefly explain why patient needs home oxygen: Desaturation with exertion

## 2022-06-22 NOTE — TOC Transition Note (Addendum)
Transition of Care [facility]) - CM/SW Discharge Note   Patient Details  Name: [name] MRN: [id number] Date of Birth: [birth year]  Transition of Care [facility]) CM/SW Contact:  Henrietta Dine, RN Phone Number: 06/22/2022, 3:30 PM   Clinical Narrative:    Orders received for home oxygen; spoke w/ pt in room; she agrees to recc and has no agency preference; she verified home address [phone]; pt can be contacted at [phone]; she identified POC Dan Humphreys [phone]); spoke w/ Brenton Grills at [facility] and he says agency can provided services; agency will deliver travel tank to room; pt given name of agency and, and contact info placed in follow up provider section of d/c instructions; pt verbalized understanding; no TOC needs.  Final next level of care: Home/Self Care Barriers to Discharge: No Barriers Identified   Patient Goals and CMS Choice CMS Medicare.gov Compare Post Acute Care list provided to:: Patient Choice offered to / list presented to : Patient  Discharge Placement                         Discharge Plan and Services Additional resources added to the After Visit Summary for     Discharge Planning Services: CM Consult            DME Arranged: Oxygen DME Agency: [facility] Date DME Agency Contacted: 06/22/22 Time DME Agency Contacted: [phone] Representative spoke with at DME Agency: [facility] Determinants of Health ([place]) Interventions SDOH Screenings   Food Insecurity: No Food Insecurity (06/21/2022)  Housing: Low Risk  (06/21/2022)  Transportation Needs: No Transportation Needs (06/21/2022)  Utilities: Not At Risk (06/21/2022)  Alcohol Screen: Low Risk  (07/20/2017)  Depression (PHQ2-9): Medium Risk (01/30/2022)  Tobacco Use: High Risk (06/21/2022)     Readmission Risk Interventions    06/19/2022    3:22 PM 05/17/2022   12:29 PM 08/31/2021    1:52 PM  Readmission Risk Prevention Plan  Post Dischage  Appt   Complete  Medication Screening   Complete  Transportation Screening Complete Complete Complete  PCP or Specialist Appt within 5-7 Days  Complete   PCP or Specialist Appt within 3-5 Days Complete    Home Care Screening  Complete   Medication Review (RN CM)  Complete   HRI or Home Care Consult Complete    Social Work Consult for [place] Planning/Counseling Complete    Palliative Care Screening Not Applicable    Medication Review Press photographer) Complete
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
## 2022-06-24 NOTE — Telephone Encounter (Signed)
I do not recommend vaping as an alternative to smoking, regardless of nicotine content, vapes have been known to cause lung injury/damage. Please make sure she has hospital follow up in 1-2 weeks. Thanks.

## 2022-07-11 ENCOUNTER — Encounter: Payer: Self-pay | Admitting: Gastroenterology

## 2022-07-29 NOTE — Telephone Encounter (Signed)
1 

## 2022-08-07 ENCOUNTER — Encounter: Payer: Self-pay | Admitting: Internal Medicine

## 2022-08-07 ENCOUNTER — Ambulatory Visit (INDEPENDENT_AMBULATORY_CARE_PROVIDER_SITE_OTHER): Payer: Medicaid Other | Admitting: Internal Medicine

## 2022-08-07 VITALS — BP 130/72 | HR 107 | Temp 98.5°F | Ht 64.0 in | Wt 187.8 lb

## 2022-08-07 DIAGNOSIS — G4733 Obstructive sleep apnea (adult) (pediatric): Secondary | ICD-10-CM | POA: Diagnosis not present

## 2022-08-07 DIAGNOSIS — F17210 Nicotine dependence, cigarettes, uncomplicated: Secondary | ICD-10-CM

## 2022-08-07 DIAGNOSIS — Z720 Tobacco use: Secondary | ICD-10-CM

## 2022-08-07 DIAGNOSIS — J4489 Other specified chronic obstructive pulmonary disease: Secondary | ICD-10-CM | POA: Diagnosis not present

## 2022-08-07 NOTE — Patient Instructions (Addendum)
Please schedule follow up scheduled with myself in 3 months.  If my schedule is not open yet, we will contact you with a reminder closer to that time. Please call [phone] if you haven't heard from [place] a month before.   Try the patch and lozenges for quitting smoking. You can do it!  Stop using albuterol before bedtime in case it's making it harder for you to fall asleep.  Continue spiriva and advair.   Will order a home sleep study to check you for sleep apnea.   At next visit depending on how you are feeling we will discuss dupixent or similar agents to treat your asthma symptoms.
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
## 2022-08-07 NOTE — Addendum Note (Signed)
Addended by: Loma Sousa on: 08/07/2022 04:16 PM   Modules accepted: Orders

## 2022-08-27 ENCOUNTER — Ambulatory Visit: Payer: Medicaid Other | Admitting: Gastroenterology

## 2022-08-30 ENCOUNTER — Other Ambulatory Visit (INDEPENDENT_AMBULATORY_CARE_PROVIDER_SITE_OTHER): Payer: Medicaid Other

## 2022-08-30 ENCOUNTER — Encounter: Payer: Self-pay | Admitting: Nurse Practitioner

## 2022-08-30 ENCOUNTER — Ambulatory Visit (INDEPENDENT_AMBULATORY_CARE_PROVIDER_SITE_OTHER): Payer: Medicaid Other | Admitting: Nurse Practitioner

## 2022-08-30 VITALS — BP 120/74 | HR 87 | Ht 64.0 in | Wt 190.0 lb

## 2022-08-30 DIAGNOSIS — F1020 Alcohol dependence, uncomplicated: Secondary | ICD-10-CM

## 2022-08-30 DIAGNOSIS — R197 Diarrhea, unspecified: Secondary | ICD-10-CM | POA: Diagnosis not present

## 2022-08-30 DIAGNOSIS — R1084 Generalized abdominal pain: Secondary | ICD-10-CM

## 2022-08-30 LAB — CBC WITH DIFFERENTIAL/PLATELET
Basophils Absolute: 0.1 10*3/uL (ref 0.0–0.1)
Basophils Relative: 0.9 % (ref 0.0–3.0)
Eosinophils Absolute: 0.1 10*3/uL (ref 0.0–0.7)
Eosinophils Relative: 0.8 % (ref 0.0–5.0)
HCT: 45.2 % (ref 36.0–46.0)
Hemoglobin: 15 g/dL (ref 12.0–15.0)
Lymphocytes Relative: 21.3 % (ref 12.0–46.0)
Lymphs Abs: 1.9 10*3/uL (ref 0.7–4.0)
MCHC: 33.3 g/dL (ref 30.0–36.0)
MCV: 90.6 fl (ref 78.0–100.0)
Monocytes Absolute: 0.9 10*3/uL (ref 0.1–1.0)
Monocytes Relative: 9.7 % (ref 3.0–12.0)
Neutro Abs: 6.1 10*3/uL (ref 1.4–7.7)
Neutrophils Relative %: 67.3 % (ref 43.0–77.0)
Platelets: 345 10*3/uL (ref 150.0–400.0)
RBC: 4.98 Mil/uL (ref 3.87–5.11)
RDW: 15.5 % (ref 11.5–15.5)
WBC: 9.1 10*3/uL (ref 4.0–10.5)

## 2022-08-30 LAB — COMPREHENSIVE METABOLIC PANEL
ALT: 11 U/L (ref 0–35)
AST: 17 U/L (ref 0–37)
Albumin: 4.3 g/dL (ref 3.5–5.2)
Alkaline Phosphatase: 96 U/L (ref 39–117)
BUN: 8 mg/dL (ref 6–23)
CO2: 28 mEq/L (ref 19–32)
Calcium: 9.7 mg/dL (ref 8.4–10.5)
Chloride: 103 mEq/L (ref 96–112)
Creatinine, Ser: 0.97 mg/dL (ref 0.40–1.20)
GFR: 73.55 mL/min (ref >60.00)
Glucose, Bld: 87 mg/dL (ref 70–99)
Potassium: 4.2 mEq/L (ref 3.5–5.1)
Sodium: 139 mEq/L (ref 135–145)
Total Bilirubin: 0.4 mg/dL (ref 0.2–1.2)
Total Protein: 7.2 g/dL (ref 6.0–8.3)

## 2022-08-30 LAB — C-REACTIVE PROTEIN: CRP: <1 mg/dL (ref 0.5–20.0)

## 2022-08-30 LAB — TSH: TSH: 1.75 u[IU]/mL (ref 0.35–5.50)

## 2022-08-30 MED ORDER — FAMOTIDINE 40 MG PO TABS: 40.0000 mg | ORAL_TABLET | Freq: Every day | ORAL | 1 refills | Status: DC

## 2022-08-30 NOTE — Patient Instructions (Addendum)
Your provider has requested that you go to the basement level for lab work before leaving today. Press "B" on the elevator. The lab is located at the first door on the left as you exit the elevator.  Reduce alcohol & soda.  One week after starting Famotidine, reduce Omeprazole to 1 tablet every other day for a week then stop the Omeprazole.  Benefiber- 1 tablespoon daily to bulk up stools.  Due to recent changes in healthcare laws, you may see the results of your imaging and laboratory studies on MyChart before your provider has had a chance to review them.  We understand that in some cases there may be results that are confusing or concerning to you. Not all laboratory results come back in the same time frame and the provider may be waiting for multiple results in order to interpret others.  Please give [place] 48 hours in order for your provider to thoroughly review all the results before contacting the office for clarification of your results.   Thank you for trusting me with your gastrointestinal care!   Alcide Evener, CRNP

## 2022-08-30 NOTE — Progress Notes (Unsigned)
08/30/2022 [name] [id number] September 17, 1980   CHIEF COMPLAINT: Chronic diarrhea  HISTORY OF PRESENT ILLNESS: [name]  is a 42 year old female with a past medical history of anxiety, depression, asthma, hypertension and GERD. She presents to our office today for further evaluation regarding chronic loose stools.  She describes passing nonbloody loose stool on and off for the past few years. However, over the past few months she is having more frequent and intermittently urgent episodes of diarrhea which occur 10 to 15 minutes after eating. No mucus per the rectum or tenesmus. Drinking water also triggers diarrhea.  She takes Pepto-Bismol infrequently with symptom relief.  Imodium resulted in abdominal pain. She sometimes has right or left mid abdominal pain which is not severe. No specific food triggers. She takes Bentyl 3 times daily for the past year without significant improvement. No antibiotics within the past few months.  She drinks 4 beers 5-6 nights weekly for the past 5 years.  She typically eats 1 meal daily.  She takes ibuprofen 800 mg 1 tab 2 to 3 days weekly for headaches. She takes Protonix 40 mg daily for acid reflux as prescribed by her pulmonologist who was concerned reflux was triggering asthma flares. She had heartburn in the past which resolved since she started taking Protonix.  No dysphagia. She denies ever having an EGD or colonoscopy.  She has menorrhagia, heavy menstrual bleeding 8 to 9 days monthly.      Latest Ref Rng & Units 06/20/2022    7:16 AM 06/19/2022    3:17 AM 06/17/2022   11:54 PM  CBC  WBC 4.0 - 10.5 K/uL 6.0  8.0  7.5   Hemoglobin 12.0 - 15.0 g/dL 78.2  95.6  21.3   Hematocrit 36.0 - 46.0 % 42.2  38.7  42.7   Platelets 150 - 400 K/uL 291  255  254        Latest Ref Rng & Units 06/22/2022    7:59 AM 06/21/2022    7:57 AM 06/20/2022    7:16 AM  CMP  Glucose 70 - 99 mg/dL 086  578  469   BUN 6 - 20 mg/dL Creatinine 0.44 -  1.00 mg/dL 6.29  5.28  4.13   Sodium 135 - 145 mmol/L 138  139  138   Potassium 3.5 - 5.1 mmol/L 4.3  4.1  4.7   Chloride 98 - 111 mmol/L 96  96  92   CO2 22 - 32 mmol/L 32  35  38   Calcium 8.9 - 10.3 mg/dL 9.0  8.6  8.8      Past Medical History:  Diagnosis Date   Anxiety    Asthma    Depression    doing good, not on meds now   GERD (gastroesophageal reflux disease)    Headache    migraines   History of kidney stones    Hypertension    Kidney stones    Missed abortion 10/02/2013   Ovarian cyst    Pregnancy induced hypertension    Suicidal intent    Vaginal Pap smear, abnormal    bx, f/u ok   Past Surgical History:  Procedure Laterality Date   CESAREAN SECTION     CESAREAN SECTION N/A 06/06/2015   Procedure: CESAREAN SECTION;  Surgeon: Kathreen Cosier, MD;  Location: [facility];  Service: Obstetrics;  Laterality: N/A;   CHOLECYSTECTOMY N/A 08/23/2016   Procedure: LAPAROSCOPIC  CHOLECYSTECTOMY;  Surgeon: Berna Bue, MD;  Location: [facility];  Service: General;  Laterality: N/A;   DILATION AND CURETTAGE OF UTERUS     DILATION AND EVACUATION N/A 10/04/2013   Procedure: DILATATION AND EVACUATION;  Surgeon: Sherian Rein, MD;  Location: [facility];  Service: Gynecology;  Laterality: N/A;  [place] in room    TONSILLECTOMY     TUBAL LIGATION    Right foot surgery 03/2022  Social History: She is single.  She is a stay-at-home mom.  She smokes cigarettes < 1/2 ppd x 15 years.  She drinks 4 beers 5-6 nights weekly. No drug use.   Family History: Mother died age 40 from pancreatic cancer.  Sister with Lupus. Maternal grandfather had ulcerative colitis and liver disease.  Father with history of asthma.  Allergies  Allergen Reactions   Alprazolam Itching   Oxycodone Itching   Penicillins Nausea And Vomiting and Other (See Comments)    Has patient had a PCN reaction causing immediate rash, facial/tongue/throat swelling, SOB or lightheadedness with hypotension:No Has patient had a PCN  reaction causing severe rash involving mucus membranes or skin necrosis:No Has patient had a PCN reaction that REACTION THAT REQUIRED HOSPITALIZATION:  #  #  #  YES  #  #  #  Has patient had a PCN reaction occurring within the last 10 years: #  #  #  YES  #  #  #    Norvasc [Amlodipine Besylate] Other (See Comments)    Tip of tongue became numb and caused flushing   Prednisone Swelling and Other (See Comments)    FACIAL swelling- breathing not affected, though      Outpatient Encounter Medications as of 08/30/2022  Medication Sig   albuterol (VENTOLIN HFA) 108 (90 Base) MCG/ACT inhaler Inhale 2 puffs into the lungs every 6 (six) hours as needed for wheezing or shortness of breath.   busPIRone (BUSPAR) 10 MG tablet Take 10 mg by mouth 3 (three) times daily.   Chlorpheniramine-Acetaminophen (CORICIDIN HBP COLD/FLU PO) Take 2 tablets by mouth daily as needed (Cold and cough symptoms).   dicyclomine (BENTYL) 10 MG capsule Take 10 mg by mouth 3 (three) times daily before meals.   fluticasone-salmeterol (ADVAIR HFA) 230-21 MCG/ACT inhaler INHALE 2 PUFFS INTO THE LUNGS TWICE A DAY (Patient taking differently: Inhale 2 puffs into the lungs 2 (two) times daily.)   hydrOXYzine (VISTARIL) 25 MG capsule Take 1 capsule (25 mg total) by mouth 3 (three) times daily as needed for anxiety.   ibuprofen (ADVIL) 200 MG tablet Take 400 mg by mouth every 6 (six) hours as needed for mild pain or headache.   ipratropium (ATROVENT) 0.03 % nasal spray Place 2 sprays into both nostrils 3 (three) times daily between meals as needed for rhinitis.   loratadine (CLARITIN) 10 MG tablet Take 1 tablet (10 mg total) by mouth daily.   losartan (COZAAR) 50 MG tablet Take 50 mg by mouth daily.   methocarbamol (ROBAXIN) 500 MG tablet Take 1 tablet by mouth every 6 (six) hours as needed for muscle spasms.   methylphenidate 27 MG PO CR tablet Take 27 mg by mouth in the morning.   montelukast (SINGULAIR) 10 MG tablet Take 10 mg by  mouth at bedtime.   nicotine (NICODERM CQ - DOSED IN MG/24 HOURS) 14 mg/24hr patch Place 14 mg onto the skin daily as needed (for cravings, while hospitalized).   OLANZapine (ZYPREXA) 10 MG tablet Take 10 mg by mouth at bedtime.  ondansetron (ZOFRAN) 4 MG tablet Take 4 mg by mouth every 8 (eight) hours as needed for nausea or vomiting.   pantoprazole (PROTONIX) 40 MG tablet TAKE 1 TABLET BY MOUTH EVERY DAY (Patient taking differently: Take 40 mg by mouth daily before breakfast.)   Tiotropium Bromide Monohydrate (SPIRIVA RESPIMAT) 2.5 MCG/ACT AERS Inhale 2 puffs into the lungs daily.   venlafaxine XR (EFFEXOR-XR) 37.5 MG 24 hr capsule Take 37.5 mg by mouth daily with breakfast.   [DISCONTINUED] benzonatate (TESSALON) 200 MG capsule Take 1 capsule (200 mg total) by mouth 3 (three) times daily as needed for cough.   No facility-administered encounter medications on file as of 08/30/2022.   REVIEW OF SYSTEMS:  Gen: Denies fever, sweats or chills. No weight loss.  CV: Denies chest pain, palpitations or edema. Resp: Denies cough, shortness of breath of hemoptysis.  GI: Denies heartburn, dysphagia, stomach or lower abdominal pain. No diarrhea or constipation.  GU : Denies urinary burning, blood in urine, increased urinary frequency or incontinence. MS: Denies joint pain, muscles aches or weakness. Derm: Denies rash, itchiness, skin lesions or unhealing ulcers. Psych: Denies depression, anxiety, memory loss or confusion. Heme: Denies bruising, easy bleeding. Neuro:  Denies headaches, dizziness or paresthesias. Endo:  Denies any problems with DM, thyroid or adrenal function.  PHYSICAL EXAM: BP 120/74   Pulse 87   Ht  (1.626 m)   Wt 190 lb (86.2 kg)   BMI 32.61 kg/m  General: 42 year old female in no acute distress. Head: Normocephalic and atraumatic. Eyes:  Sclerae non-icteric, conjunctive pink. Ears: Normal auditory acuity. Mouth: Dentition intact. No ulcers or lesions.  Neck:  Supple, no lymphadenopathy or thyromegaly.  Lungs: Clear bilaterally to auscultation without wheezes, crackles or rhonchi. Heart: Regular rate and rhythm. No murmur, rub or gallop appreciated.  Abdomen: Soft, nontender, nondistended. No masses. No hepatosplenomegaly. Normoactive bowel sounds x 4 quadrants.  Rectal: Deferred.  Musculoskeletal: Symmetrical with no gross deformities. Skin: Warm and dry. No rash or lesions on visible extremities. Extremities: No edema. Neurological: Alert oriented x 4, no focal deficits.  Psychological:  Alert and cooperative. Normal mood and affect.  ASSESSMENT AND PLAN:  42 year old female    CC:  Roe Rutherford, NP

## 2022-09-01 LAB — TISSUE TRANSGLUTAMINASE ABS,IGG,IGA
(tTG) Ab, IgA: <1 U/mL
(tTG) Ab, IgG: <1 U/mL

## 2022-09-01 LAB — IGA: Immunoglobulin A: 136 mg/dL (ref 47–310)

## 2022-09-02 NOTE — Progress Notes (Signed)
[name], pls contact patient and send her to our lab for a fecal calprotectin level as recommended by Dr. Myrtie Neither. THX.

## 2022-09-02 NOTE — Progress Notes (Signed)
____________________________________________________________  Attending physician addendum:  Thank you for sending this case to me. I have reviewed the entire note and agree with the plan.  Do not schedule endoscopic procedures at this time because she needs more time for respiratory recovery after the recent illness. We can readdress that when she follows up with me in clinic. I also agree there are significant diet and lifestyle changes to be made.  I do not see that a fecal elastase has been checked anytime recently.  If you do not find a result for that either and did not have an order one, then that would be my additional recommendation at this time.  Amada Jupiter, MD  ____________________________________________________________

## 2022-09-06 ENCOUNTER — Telehealth: Payer: Self-pay

## 2022-09-06 NOTE — Telephone Encounter (Signed)
Contacted pt & pt verbalized understanding of normal lab results.

## 2022-09-06 NOTE — Telephone Encounter (Signed)
Contacted pt & LVM to return call regarding lab results 

## 2022-09-06 NOTE — Telephone Encounter (Signed)
Inbound call from patient, returning call in regards to results

## 2022-09-09 ENCOUNTER — Telehealth: Payer: Self-pay

## 2022-09-09 ENCOUNTER — Other Ambulatory Visit: Payer: Self-pay

## 2022-09-09 DIAGNOSIS — R197 Diarrhea, unspecified: Secondary | ICD-10-CM

## 2022-09-09 NOTE — Telephone Encounter (Signed)
Left message for pt to call back  °

## 2022-09-09 NOTE — Telephone Encounter (Signed)
Message Received: 1 week ago Melissa Natal, NP  Emeline Darling, RN      Previous Messages  Routed Note  Author: Arnaldo Natal, NP Service: Gastroenterology Author Type: Nurse Practitioner  Filed: 09/02/2022 10:13 AM Encounter Date: 08/30/2022 Status: Signed  Editor: Melissa Natal, NP (Nurse Practitioner)   [name], pls contact patient and send her to our lab for a fecal calprotectin level as recommended by Dr. Myrtie Neither. THX.

## 2022-09-10 ENCOUNTER — Encounter (HOSPITAL_COMMUNITY): Payer: Self-pay | Admitting: *Deleted

## 2022-09-10 ENCOUNTER — Other Ambulatory Visit: Payer: Self-pay

## 2022-09-10 ENCOUNTER — Emergency Department (HOSPITAL_COMMUNITY)
Admission: EM | Admit: 2022-09-10 | Discharge: 2022-09-10 | Disposition: A | Discharge status: Home / Self Care | Payer: Medicaid Other | Attending: Emergency Medicine | Admitting: Emergency Medicine

## 2022-09-10 DIAGNOSIS — A084 Viral intestinal infection, unspecified: Secondary | ICD-10-CM

## 2022-09-10 DIAGNOSIS — K529 Noninfective gastroenteritis and colitis, unspecified: Secondary | ICD-10-CM | POA: Insufficient documentation

## 2022-09-10 DIAGNOSIS — D72829 Elevated white blood cell count, unspecified: Secondary | ICD-10-CM | POA: Insufficient documentation

## 2022-09-10 DIAGNOSIS — R112 Nausea with vomiting, unspecified: Secondary | ICD-10-CM | POA: Diagnosis present

## 2022-09-10 LAB — URINALYSIS, ROUTINE W REFLEX MICROSCOPIC
Bacteria, UA: NONE SEEN
Bilirubin Urine: NEGATIVE
Glucose, UA: NEGATIVE mg/dL
Ketones, ur: NEGATIVE mg/dL
Nitrite: NEGATIVE
Protein, ur: >=300 mg/dL — AB
RBC / HPF: >50 RBC/hpf (ref 0–5)
Specific Gravity, Urine: 1.025 (ref 1.005–1.030)
pH: 5 (ref 5.0–8.0)

## 2022-09-10 LAB — COMPREHENSIVE METABOLIC PANEL
ALT: 14 U/L (ref 0–44)
AST: 21 U/L (ref 15–41)
Albumin: 3.8 g/dL (ref 3.5–5.0)
Alkaline Phosphatase: 87 U/L (ref 38–126)
Anion gap: 11 (ref 5–15)
BUN: 19 mg/dL (ref 6–20)
CO2: 22 mmol/L (ref 22–32)
Calcium: 8.3 mg/dL — ABNORMAL LOW (ref 8.9–10.3)
Chloride: 104 mmol/L (ref 98–111)
Creatinine, Ser: 0.96 mg/dL (ref 0.44–1.00)
GFR, Estimated: >60 mL/min (ref >60)
Glucose, Bld: 116 mg/dL — ABNORMAL HIGH (ref 70–99)
Potassium: 4.2 mmol/L (ref 3.5–5.1)
Sodium: 137 mmol/L (ref 135–145)
Total Bilirubin: 0.6 mg/dL (ref 0.3–1.2)
Total Protein: 7.2 g/dL (ref 6.5–8.1)

## 2022-09-10 LAB — CBC
HCT: 52 % — ABNORMAL HIGH (ref 36.0–46.0)
Hemoglobin: 16.6 g/dL — ABNORMAL HIGH (ref 12.0–15.0)
MCH: 29.9 pg (ref 26.0–34.0)
MCHC: 31.9 g/dL (ref 30.0–36.0)
MCV: 93.5 fL (ref 80.0–100.0)
Platelets: 369 10*3/uL (ref 150–400)
RBC: 5.56 MIL/uL — ABNORMAL HIGH (ref 3.87–5.11)
RDW: 14.7 % (ref 11.5–15.5)
WBC: 15.7 10*3/uL — ABNORMAL HIGH (ref 4.0–10.5)
nRBC: 0 % (ref 0.0–0.2)

## 2022-09-10 LAB — LIPASE, BLOOD: Lipase: 27 U/L (ref 11–51)

## 2022-09-10 LAB — I-STAT BETA HCG BLOOD, ED (MC, WL, AP ONLY): I-stat hCG, quantitative: <5 m[IU]/mL (ref <5)

## 2022-09-10 MED ORDER — SODIUM CHLORIDE 0.9 % IV BOLUS
1000.0000 mL | Freq: Once | INTRAVENOUS | Status: AC
  Administered 2022-09-10: 1000 mL via INTRAVENOUS

## 2022-09-10 MED ORDER — KETOROLAC TROMETHAMINE 15 MG/ML IJ SOLN
15.0000 mg | Freq: Once | INTRAMUSCULAR | Status: AC
  Administered 2022-09-10: 15 mg via INTRAVENOUS
  Filled 2022-09-10: qty 1

## 2022-09-10 MED ORDER — ONDANSETRON 4 MG PO TBDP: 4.0000 mg | ORAL_TABLET | Freq: Three times a day (TID) | ORAL | 0 refills | Status: DC | PRN

## 2022-09-10 MED ORDER — ONDANSETRON HCL 4 MG/2ML IJ SOLN
4.0000 mg | Freq: Once | INTRAMUSCULAR | Status: AC
  Administered 2022-09-10: 4 mg via INTRAVENOUS
  Filled 2022-09-10: qty 2

## 2022-09-10 NOTE — ED Notes (Signed)
Pt given gingerale per pt request.

## 2022-09-10 NOTE — ED Provider Notes (Signed)
[place] EMERGENCY DEPARTMENT AT [facility] Provider Note   CSN: [id number] Arrival date & time: 09/10/22  1215     History  Chief Complaint  Patient presents with   Nausea    [name] is a 42 y.o. female, history of alcohol abuse, who presents to the ED secondary to nausea, vomiting, diarrhea x 1 day.  She states that her daughter is sick, with a GI bug, and she thinks she got it from her.  She states that she has had 5+ episodes of diarrhea, 9+ episodes of vomiting.  She says her bilateral upper abdomen hurts, and feels achy.  She denies any sharp stabbing pain.  No urinary symptoms.  States she tried to take some Zofran, but threw it up, and has not been able to keep anything down.  Denies any fevers, chills.  Home Medications Prior to Admission medications   Medication Sig Start Date End Date Taking? Authorizing Provider  ondansetron (ZOFRAN-ODT) 4 MG disintegrating tablet Take 1 tablet (4 mg total) by mouth every 8 (eight) hours as needed for nausea or vomiting. 09/10/22  Yes Samyria Rudie L, PA  albuterol (VENTOLIN HFA) 108 (90 Base) MCG/ACT inhaler Inhale 2 puffs into the lungs every 6 (six) hours as needed for wheezing or shortness of breath.    [provider]  busPIRone (BUSPAR) 10 MG tablet Take 10 mg by mouth 3 (three) times daily. 08/11/21   [provider]  Chlorpheniramine-Acetaminophen (CORICIDIN HBP COLD/FLU PO) Take 2 tablets by mouth daily as needed (Cold and cough symptoms).    [provider]  dicyclomine (BENTYL) 10 MG capsule Take 10 mg by mouth 3 (three) times daily before meals.    [provider]  famotidine (PEPCID) 40 MG tablet Take 1 tablet (40 mg total) by mouth at bedtime. 08/30/22   Arnaldo Natal, NP  fluticasone-salmeterol (ADVAIR HFA) 230-21 MCG/ACT inhaler INHALE 2 PUFFS INTO THE LUNGS TWICE A DAY Patient taking differently: Inhale 2 puffs into the lungs 2 (two) times daily. 06/12/22    Cobb, Ruby Cola, NP  hydrOXYzine (VISTARIL) 25 MG capsule Take 1 capsule (25 mg total) by mouth 3 (three) times daily as needed for anxiety. 06/22/22   Narda Bonds, MD  ibuprofen (ADVIL) 200 MG tablet Take 400 mg by mouth every 6 (six) hours as needed for mild pain or headache.    [provider]  ipratropium (ATROVENT) 0.03 % nasal spray Place 2 sprays into both nostrils 3 (three) times daily between meals as needed for rhinitis.    [provider]  loratadine (CLARITIN) 10 MG tablet Take 1 tablet (10 mg total) by mouth daily. 06/22/22 09/20/22  Narda Bonds, MD  losartan (COZAAR) 50 MG tablet Take 50 mg by mouth daily.    [provider]  methocarbamol (ROBAXIN) 500 MG tablet Take 1 tablet by mouth every 6 (six) hours as needed for muscle spasms. 05/01/22   [provider]  methylphenidate 27 MG PO CR tablet Take 27 mg by mouth in the morning.    [provider]  montelukast (SINGULAIR) 10 MG tablet Take 10 mg by mouth at bedtime. 12/15/20   [provider]  nicotine (NICODERM CQ - DOSED IN MG/24 HOURS) 14 mg/24hr patch Place 14 mg onto the skin daily as needed (for cravings, while hospitalized).    [provider]  OLANZapine (ZYPREXA) 10 MG tablet Take 10 mg by mouth at bedtime. 08/13/21   [provider]  ondansetron (ZOFRAN) 4 MG tablet Take 4 mg by mouth every 8 (eight) hours as needed for nausea or vomiting.    [provider]  pantoprazole (PROTONIX) 40 MG tablet TAKE 1 TABLET BY MOUTH EVERY DAY Patient taking differently: Take 40 mg by mouth daily before breakfast. 12/14/20   Charlott Holler, MD  Tiotropium Bromide Monohydrate (SPIRIVA RESPIMAT) 2.5 MCG/ACT AERS Inhale 2 puffs into the lungs daily. 06/07/22   Cobb, Ruby Cola, NP  venlafaxine XR (EFFEXOR-XR) 37.5 MG 24 hr capsule Take 37.5 mg by mouth daily with breakfast.    [provider]      Allergies    Alprazolam, Oxycodone, Penicillins,  Norvasc [amlodipine besylate], and Prednisone    Review of Systems   Review of Systems  Constitutional:  Negative for fever.  Gastrointestinal:  Positive for abdominal pain, diarrhea, nausea and vomiting.    Physical Exam Updated Vital Signs BP 116/74   Pulse (!) 110   Temp 98.2 F (36.8 C) (Oral)   Resp 18   SpO2 91%  Physical Exam Vitals and nursing note reviewed.  Constitutional:      General: She is not in acute distress.    Appearance: She is well-developed.  HENT:     Head: Normocephalic and atraumatic.  Eyes:     Conjunctiva/sclera: Conjunctivae normal.  Cardiovascular:     Rate and Rhythm: Normal rate and regular rhythm.     Heart sounds: No murmur heard. Pulmonary:     Effort: Pulmonary effort is normal. No respiratory distress.     Breath sounds: Normal breath sounds.  Abdominal:     Palpations: Abdomen is soft.     Tenderness: There is abdominal tenderness in the right upper quadrant and left upper quadrant. There is no guarding or rebound.  Musculoskeletal:        General: No swelling.     Cervical back: Neck supple.  Skin:    General: Skin is warm and dry.     Capillary Refill: Capillary refill takes less than 2 seconds.  Neurological:     Mental Status: She is alert.  Psychiatric:        Mood and Affect: Mood normal.     ED Results / Procedures / Treatments   Labs (all labs ordered are listed, but only abnormal results are displayed) Labs Reviewed  CBC - Abnormal; Notable for the following components:      Result Value   WBC 15.7 (*)    RBC 5.56 (*)    Hemoglobin 16.6 (*)    HCT 52.0 (*)    All other components within normal limits  URINALYSIS, ROUTINE W REFLEX MICROSCOPIC - Abnormal; Notable for the following components:   Color, Urine RED (*)    APPearance CLOUDY (*)    Hgb urine dipstick LARGE (*)    Protein, ur >=300 (*)    Leukocytes,Ua TRACE (*)    All other components within normal limits  COMPREHENSIVE METABOLIC PANEL - Abnormal;  Notable for the following components:   Glucose, Bld 116 (*)    Calcium 8.3 (*)    All other components within normal limits  LIPASE, BLOOD  I-STAT BETA HCG BLOOD, ED ([facility], [facility], [facility] ONLY)    EKG None  Radiology No results found.  Procedures Procedures    Medications Ordered in ED Medications  ondansetron (ZOFRAN) injection 4 mg (4 mg Intravenous Given 09/10/22 1328)  sodium chloride 0.9 % bolus 1,000 mL (0 mLs Intravenous Stopped 09/10/22 1500)  ketorolac (  TORADOL) 15 MG/ML injection 15 mg (15 mg Intravenous Given 09/10/22 1328)    ED Course/ Medical Decision Making/ A&P                             Medical Decision Making Patient is a 42 year old female, here for nausea, vomiting, diarrhea for the last 24 hours.  Daughter is sick as well.  Will obtain electrolytes to evaluate for any electrolyte deficiencies, as well as labs including lipase given her alcohol use disorder.  She has no guarding on exam so we will hold off on any kind of CT at this time.  Amount and/or Complexity of Data Reviewed Labs: ordered.    Details: Leukocytosis of 15.7 K, no acute electrolyte abnormalities Discussion of management or test interpretation with external provider(s): Discussed with patient, she is feeling much better after IV fluids, Zofran, Toradol, she is well-appearing and able to tolerate p.o.  She does have leukocytosis of 15.7 K, but I believe this is likely reactive from her profuse vomiting, and diarrhea.  Her symptoms most likely align with viral gastroenteritis given her nausea, vomiting, diarrhea for the last 24 hours and her daughter is sick as well.  We discussed return precautions and she voiced understanding.  Lipase was within normal limits.  Discharged home.  Risk Prescription drug management.    Final Clinical Impression(s) / ED Diagnoses Final diagnoses:  Viral gastroenteritis    Rx / DC Orders ED Discharge Orders          Ordered    ondansetron (ZOFRAN-ODT) 4 MG  disintegrating tablet  Every 8 hours PRN        09/10/22 1627              Stanton Kissoon Elbert Ewings, PA 09/10/22 1629    Glyn Ade, MD 09/11/22 [phone]

## 2022-09-10 NOTE — ED Triage Notes (Signed)
Pt is here for n/v and diarrhea which began this am. Pt also reports left upper abdominal pain

## 2022-09-10 NOTE — Telephone Encounter (Signed)
Pt made aware of Dr. Myrtie Neither recommendations: Order for lab has been placed in Epic. Pt made aware. Location to the lab provided. Pt verbalized understanding with all questions answered.

## 2022-09-10 NOTE — Discharge Instructions (Addendum)
Please follow-up with your primary care doctor, and make sure you are drinking lots of fluids.  You can take the nausea medicine to help with your nausea.  Additionally try to eat very bland foods, and hydrate yourself.  Return to the ER if you have intractable nausea, vomiting, or severe abdominal pain.

## 2022-10-16 ENCOUNTER — Emergency Department (HOSPITAL_COMMUNITY): Payer: Medicaid Other

## 2022-10-16 ENCOUNTER — Emergency Department (HOSPITAL_COMMUNITY)
Admission: EM | Admit: 2022-10-16 | Discharge: 2022-10-16 | Disposition: A | Discharge status: Home / Self Care | Payer: Medicaid Other | Attending: Emergency Medicine | Admitting: Emergency Medicine

## 2022-10-16 ENCOUNTER — Other Ambulatory Visit: Payer: Self-pay

## 2022-10-16 ENCOUNTER — Encounter (HOSPITAL_COMMUNITY): Payer: Self-pay

## 2022-10-16 DIAGNOSIS — I10 Essential (primary) hypertension: Secondary | ICD-10-CM | POA: Insufficient documentation

## 2022-10-16 DIAGNOSIS — R350 Frequency of micturition: Secondary | ICD-10-CM | POA: Insufficient documentation

## 2022-10-16 DIAGNOSIS — Z6833 Body mass index (BMI) 33.0-33.9, adult: Secondary | ICD-10-CM | POA: Diagnosis not present

## 2022-10-16 DIAGNOSIS — J449 Chronic obstructive pulmonary disease, unspecified: Secondary | ICD-10-CM | POA: Diagnosis not present

## 2022-10-16 DIAGNOSIS — M545 Low back pain, unspecified: Secondary | ICD-10-CM | POA: Diagnosis not present

## 2022-10-16 DIAGNOSIS — E669 Obesity, unspecified: Secondary | ICD-10-CM | POA: Insufficient documentation

## 2022-10-16 LAB — CBC WITH DIFFERENTIAL/PLATELET
Abs Immature Granulocytes: 0.07 10*3/uL (ref 0.00–0.07)
Basophils Absolute: 0.1 10*3/uL (ref 0.0–0.1)
Basophils Relative: 2 %
Eosinophils Absolute: 0.1 10*3/uL (ref 0.0–0.5)
Eosinophils Relative: 1 %
HCT: 45.2 % (ref 36.0–46.0)
Hemoglobin: 14.8 g/dL (ref 12.0–15.0)
Immature Granulocytes: 1 %
Lymphocytes Relative: 20 %
Lymphs Abs: 1.9 10*3/uL (ref 0.7–4.0)
MCH: 29.7 pg (ref 26.0–34.0)
MCHC: 32.7 g/dL (ref 30.0–36.0)
MCV: 90.6 fL (ref 80.0–100.0)
Monocytes Absolute: 0.8 10*3/uL (ref 0.1–1.0)
Monocytes Relative: 8 %
Neutro Abs: 6.4 10*3/uL (ref 1.7–7.7)
Neutrophils Relative %: 68 %
Platelets: 316 10*3/uL (ref 150–400)
RBC: 4.99 MIL/uL (ref 3.87–5.11)
RDW: 15.1 % (ref 11.5–15.5)
WBC: 9.5 10*3/uL (ref 4.0–10.5)
nRBC: 0 % (ref 0.0–0.2)

## 2022-10-16 LAB — COMPREHENSIVE METABOLIC PANEL
ALT: 14 U/L (ref 0–44)
AST: 20 U/L (ref 15–41)
Albumin: 4.1 g/dL (ref 3.5–5.0)
Alkaline Phosphatase: 85 U/L (ref 38–126)
Anion gap: 8 (ref 5–15)
BUN: 13 mg/dL (ref 6–20)
CO2: 25 mmol/L (ref 22–32)
Calcium: 9.3 mg/dL (ref 8.9–10.3)
Chloride: 102 mmol/L (ref 98–111)
Creatinine, Ser: 1.07 mg/dL — ABNORMAL HIGH (ref 0.44–1.00)
GFR, Estimated: >60 mL/min (ref >60)
Glucose, Bld: 102 mg/dL — ABNORMAL HIGH (ref 70–99)
Potassium: 4.5 mmol/L (ref 3.5–5.1)
Sodium: 135 mmol/L (ref 135–145)
Total Bilirubin: 1 mg/dL (ref 0.3–1.2)
Total Protein: 7.5 g/dL (ref 6.5–8.1)

## 2022-10-16 LAB — URINALYSIS, ROUTINE W REFLEX MICROSCOPIC
Bilirubin Urine: NEGATIVE
Glucose, UA: NEGATIVE mg/dL
Hgb urine dipstick: NEGATIVE
Ketones, ur: NEGATIVE mg/dL
Leukocytes,Ua: NEGATIVE
Nitrite: NEGATIVE
Protein, ur: 30 mg/dL — AB
Specific Gravity, Urine: 1.017 (ref 1.005–1.030)
pH: 6 (ref 5.0–8.0)

## 2022-10-16 LAB — PREGNANCY, URINE: Preg Test, Ur: NEGATIVE

## 2022-10-16 LAB — LIPASE, BLOOD: Lipase: 28 U/L (ref 11–51)

## 2022-10-16 MED ORDER — ACETAMINOPHEN 500 MG PO TABS
1000.0000 mg | ORAL_TABLET | Freq: Once | ORAL | Status: AC
  Administered 2022-10-16: 1000 mg via ORAL
  Filled 2022-10-16: qty 2

## 2022-10-16 MED ORDER — ONDANSETRON 4 MG PO TBDP
4.0000 mg | ORAL_TABLET | Freq: Once | ORAL | Status: AC
  Administered 2022-10-16: 4 mg via ORAL
  Filled 2022-10-16: qty 1

## 2022-10-16 NOTE — Discharge Instructions (Signed)
You were seen in the emergency department for your urinary frequency and back pain.  Your workup showed no signs of kidney stones or kidney infection and it is unclear what is causing your symptoms at this time.  You can continue to take Tylenol and Motrin as needed for pain and both can be taken up to every 6 hours.  You can follow-up with your primary doctor and your urologist to have your symptoms rechecked.  We did send a urine culture so if this grows back positive you will receive a call and have antibiotics sent to your pharmacy.  You should return to the emergency department if you have significantly worsening pain, fevers, repetitive vomiting or any other new or concerning symptoms.

## 2022-10-16 NOTE — ED Provider Notes (Signed)
EMERGENCY DEPARTMENT AT [facility] Provider Note   CSN: [id number] Arrival date & time: 10/16/22  6644     History  Chief Complaint  Patient presents with   Urinary Frequency   Flank Pain    [name] is a 42 y.o. female.  Patient is a 42 year old female with a past medical history of hypertension and COPD presenting to the emergency department with urinary frequency.  She states that last Thursday she started to have urinary frequency and was taking Azo over-the-counter and over the last few days the pain is started to move more into her right lower back.  She states that she has some nausea but denies any vomiting.  She denies any fevers or chills.  She denies any diarrhea or constipation.  She states that she has been taking Motrin at home with minimal relief.  The history is provided by the patient.  Urinary Frequency  Flank Pain       Home Medications Prior to Admission medications   Medication Sig Start Date End Date Taking? Authorizing Provider  busPIRone (BUSPAR) 10 MG tablet Take 10 mg by mouth 3 (three) times daily. 08/11/21  Yes [provider]  dicyclomine (BENTYL) 10 MG capsule Take 10 mg by mouth 3 (three) times daily before meals.   Yes [provider]  fluticasone-salmeterol (ADVAIR HFA) 230-21 MCG/ACT inhaler INHALE 2 PUFFS INTO THE LUNGS TWICE A DAY Patient taking differently: Inhale 2 puffs into the lungs 2 (two) times daily. 06/12/22  Yes Cobb, Ruby Cola, NP  hydrOXYzine (VISTARIL) 25 MG capsule Take 1 capsule (25 mg total) by mouth 3 (three) times daily as needed for anxiety. 06/22/22  Yes Narda Bonds, MD  ibuprofen (ADVIL) 200 MG tablet Take 400 mg by mouth every 6 (six) hours as needed for mild pain or headache.   Yes [provider]  loratadine (CLARITIN) 10 MG tablet Take 1 tablet (10 mg total) by mouth daily. 06/22/22 10/16/22 Yes Narda Bonds, MD  LORazepam (ATIVAN) 1 MG tablet Take 1 mg by  mouth 2 (two) times daily. 09/30/22 09/30/23 Yes [provider]  losartan (COZAAR) 50 MG tablet Take 50 mg by mouth daily.   Yes [provider]  methylphenidate 27 MG PO CR tablet Take 27 mg by mouth in the morning.   Yes [provider]  montelukast (SINGULAIR) 10 MG tablet Take 10 mg by mouth at bedtime. 12/15/20  Yes [provider]  OLANZapine (ZYPREXA) 10 MG tablet Take 10 mg by mouth at bedtime. 08/13/21  Yes [provider]  pantoprazole (PROTONIX) 40 MG tablet TAKE 1 TABLET BY MOUTH EVERY DAY Patient taking differently: Take 40 mg by mouth daily before breakfast. 12/14/20  Yes Charlott Holler, MD  solifenacin (VESICARE) 5 MG tablet Take 5 mg by mouth daily. 10/02/22  Yes [provider]  Tiotropium Bromide Monohydrate (SPIRIVA RESPIMAT) 2.5 MCG/ACT AERS Inhale 2 puffs into the lungs daily. 06/07/22  Yes Cobb, Ruby Cola, NP  venlafaxine XR (EFFEXOR-XR) 37.5 MG 24 hr capsule Take 37.5 mg by mouth daily with breakfast.   Yes [provider]  famotidine (PEPCID) 40 MG tablet Take 1 tablet (40 mg total) by mouth at bedtime. Patient not taking: Reported on 10/16/2022 08/30/22   Arnaldo Natal, NP  ondansetron (ZOFRAN-ODT) 4 MG disintegrating tablet Take 1 tablet (4 mg total) by mouth every 8 (eight) hours as needed for nausea or vomiting. Patient not taking: Reported on 10/16/2022  09/10/22   Small, Brooke L, PA      Allergies    Alprazolam, Oxycodone, Penicillins, Norvasc [amlodipine besylate], and Prednisone    Review of Systems   Review of Systems  Genitourinary:  Positive for flank pain and frequency.    Physical Exam Updated Vital Signs BP (!) 130/92   Pulse 90   Temp 98.1 F (36.7 C) (Oral)   Resp 18   Ht 5\' 4"  (1.626 m)   Wt 88.5 kg   LMP 10/08/2022 Comment: negative urine pregnancy test 10-16-2022  SpO2 97%   BMI 33.47 kg/m  Physical Exam Vitals and nursing note reviewed.  Constitutional:      General:  She is not in acute distress.    Appearance: Normal appearance. She is obese.  HENT:     Head: Normocephalic and atraumatic.     Nose: Nose normal.     Mouth/Throat:     Mouth: Mucous membranes are moist.     Pharynx: Oropharynx is clear.  Eyes:     Extraocular Movements: Extraocular movements intact.     Conjunctiva/sclera: Conjunctivae normal.  Cardiovascular:     Rate and Rhythm: Normal rate and regular rhythm.     Heart sounds: Normal heart sounds.  Pulmonary:     Effort: Pulmonary effort is normal.     Breath sounds: Normal breath sounds.  Abdominal:     General: Abdomen is flat.     Palpations: Abdomen is soft.     Tenderness: There is abdominal tenderness (Suprapubic). There is no right CVA tenderness, left CVA tenderness, guarding or rebound.  Musculoskeletal:        General: Normal range of motion.     Cervical back: Normal range of motion.     Comments: No midline back tenderness, right-sided lumbar paraspinal muscle tenderness  Skin:    General: Skin is warm and dry.  Neurological:     General: No focal deficit present.     Mental Status: She is alert and oriented to person, place, and time.  Psychiatric:        Mood and Affect: Mood normal.        Behavior: Behavior normal.     ED Results / Procedures / Treatments   Labs (all labs ordered are listed, but only abnormal results are displayed) Labs Reviewed  URINALYSIS, ROUTINE W REFLEX MICROSCOPIC - Abnormal; Notable for the following components:      Result Value   Protein, ur 30 (*)    Bacteria, UA RARE (*)    All other components within normal limits  COMPREHENSIVE METABOLIC PANEL - Abnormal; Notable for the following components:   Glucose, Bld 102 (*)    Creatinine, Ser 1.07 (*)    All other components within normal limits  URINE CULTURE  PREGNANCY, URINE  CBC WITH DIFFERENTIAL/PLATELET  LIPASE, BLOOD    EKG None  Radiology CT Renal Stone Study  Result Date: 10/16/2022 CLINICAL DATA:  Flank  pain on the right with the urinary frequency, urgency and chills for 5 days. EXAM: CT ABDOMEN AND PELVIS WITHOUT CONTRAST TECHNIQUE: Multidetector CT imaging of the abdomen and pelvis was performed following the standard protocol without IV contrast. RADIATION DOSE REDUCTION: This exam was performed according to the departmental dose-optimization program which includes automated exposure control, adjustment of the mA and/or kV according to patient size and/or use of iterative reconstruction technique. COMPARISON:  CT 02/17/2020 FINDINGS: Lower chest: Lung bases are clear.  No pleural effusion. Hepatobiliary: No focal liver abnormality  is seen. Status post cholecystectomy. No biliary dilatation. Pancreas: Unremarkable. No pancreatic ductal dilatation or surrounding inflammatory changes. Spleen: Normal in size without focal abnormality. Adrenals/Urinary Tract: Adrenal glands are preserved. There is moderate atrophy of the right kidney. No right-sided renal or ureteral stones. The right ureter has a normal course and caliber extending down to the bladder. Left kidney has a punctate nonobstructing upper pole left-sided stone. No collecting system dilatation. The ureter has a normal course and caliber down to the bladder. Preserved contours of the urinary bladder. Stomach/Bowel: On this non oral contrast exam, large bowel has a normal course and caliber. Mild colonic stool. Normal appendix. Stomach and small bowel are nondilated. Slightly redundant course of the sigmoid colon into the left midabdomen. Vascular/Lymphatic: No significant vascular findings are present. No enlarged abdominal or pelvic lymph nodes. Reproductive: Uterus and bilateral adnexa are unremarkable. Other: No abdominal wall hernia or abnormality. No abdominopelvic ascites. Musculoskeletal: Slight curvature of the spine. Mild disc height loss at L4-5 with trace retrolisthesis. IMPRESSION: Mild right-sided renal atrophy. Punctate nonobstructing  left-sided renal stone. No ureteral stones. Nonspecific bowel gas pattern with some scattered stool. Normal appendix. Electronically Signed   By: Karen Kays M.D.   On: 10/16/2022 10:13    Procedures Procedures    Medications Ordered in ED Medications  acetaminophen (TYLENOL) tablet 1,000 mg (1,000 mg Oral Given 10/16/22 0829)  ondansetron (ZOFRAN-ODT) disintegrating tablet 4 mg (4 mg Oral Given 10/16/22 6045)    ED Course/ Medical Decision Making/ A&P Clinical Course as of 10/16/22 1047  Wed Oct 16, 2022  0900 UA with rare bacteria, otherwise negative for UTI. Patient will have labs and CT stone search to further evaluate for cause of urinary frequency and flank pain. [VK]  1044 Because of rare bacteria and dysuria, urine culture will be sent but antibiotics will not be started at this time due to no other signs of UTI on work up. Labs and CT otherwise without acute explanation of pain. She was recommended outpatient urology follow up and given strict return precautions. [VK]    Clinical Course User Index [VK] Rexford Maus, DO                             Medical Decision Making This patient presents to the ED with chief complaint(s) of urinary frequency, low back pain with pertinent past medical history of hypertension, COPD which further complicates the presenting complaint. The complaint involves an extensive differential diagnosis and also carries with it a high risk of complications and morbidity.    The differential diagnosis includes UTI, pyelonephritis, considering nephrolithiasis though less likely with gradual onset of pain, no signs of sepsis on exam, considering pregnancy  Additional history obtained: Additional history obtained from N/A Records reviewed Care Everywhere/External Records -outpatient urology, seen about 2 weeks ago for urinary frequency and UA was normal at that time  ED Course and Reassessment: On patient's arrival to the emergency department she is  hemodynamically stable in no acute distress.  She will be urine and a urine Prag performed to evaluate for possible UTI, pyelonephritis or pregnancy.  Lower suspicion for nephrolithiasis though considering CT if no obvious infection in her urine.  Patient took ibuprofen 2 hours prior to arrival and will be given additional Tylenol and Zofran.  Independent labs interpretation:  The following labs were independently interpreted: Rare bacteriuria, labs otherwise within normal range  Independent visualization of imaging: - I independently  visualized the following imaging with scope of interpretation limited to determining acute life threatening conditions related to emergency care: CT AP, which revealed mild renal atrophy on the right, nonobstructing stone on the left, no obvious explanation of pain  Consultation: - Consulted or discussed management/test interpretation w/ external professional: N/A  Consideration for admission or further workup: Patient has no emergent conditions requiring admission or further work-up at this time and is stable for discharge home with primary care/urology follow-up  Social Determinants of health: N/A    Amount and/or Complexity of Data Reviewed Labs: ordered. Radiology: ordered.  Risk OTC drugs. Prescription drug management.          Final Clinical Impression(s) / ED Diagnoses Final diagnoses:  Urinary frequency  Acute right-sided low back pain without sciatica    Rx / DC Orders ED Discharge Orders     None         Rexford Maus, DO 10/16/22 1047

## 2022-10-16 NOTE — ED Triage Notes (Signed)
Right flank pain, urinary frequency, urgency, and chills x5 days Azo and ibuprofen w/o relief

## 2022-10-17 LAB — URINE CULTURE

## 2022-11-08 ENCOUNTER — Ambulatory Visit: Payer: Medicaid Other | Admitting: Gastroenterology

## 2022-12-30 ENCOUNTER — Encounter (HOSPITAL_COMMUNITY): Payer: Self-pay

## 2022-12-30 ENCOUNTER — Other Ambulatory Visit: Payer: Self-pay

## 2022-12-30 ENCOUNTER — Emergency Department (HOSPITAL_COMMUNITY)
Admission: EM | Admit: 2022-12-30 | Discharge: 2023-01-01 | Disposition: A | Discharge status: Other Acute Inpatient Hospital | Payer: Medicaid Other | Attending: Emergency Medicine | Admitting: Emergency Medicine

## 2022-12-30 DIAGNOSIS — F332 Major depressive disorder, recurrent severe without psychotic features: Secondary | ICD-10-CM | POA: Diagnosis not present

## 2022-12-30 DIAGNOSIS — Z5986 Financial insecurity: Secondary | ICD-10-CM | POA: Diagnosis not present

## 2022-12-30 DIAGNOSIS — F1010 Alcohol abuse, uncomplicated: Secondary | ICD-10-CM | POA: Insufficient documentation

## 2022-12-30 DIAGNOSIS — R4587 Impulsiveness: Secondary | ICD-10-CM | POA: Diagnosis not present

## 2022-12-30 DIAGNOSIS — F17210 Nicotine dependence, cigarettes, uncomplicated: Secondary | ICD-10-CM | POA: Diagnosis not present

## 2022-12-30 DIAGNOSIS — F32A Depression, unspecified: Secondary | ICD-10-CM

## 2022-12-30 DIAGNOSIS — Z9151 Personal history of suicidal behavior: Secondary | ICD-10-CM | POA: Insufficient documentation

## 2022-12-30 DIAGNOSIS — J45909 Unspecified asthma, uncomplicated: Secondary | ICD-10-CM | POA: Insufficient documentation

## 2022-12-30 DIAGNOSIS — K219 Gastro-esophageal reflux disease without esophagitis: Secondary | ICD-10-CM | POA: Diagnosis not present

## 2022-12-30 DIAGNOSIS — J4489 Other specified chronic obstructive pulmonary disease: Secondary | ICD-10-CM | POA: Insufficient documentation

## 2022-12-30 DIAGNOSIS — R45851 Suicidal ideations: Secondary | ICD-10-CM

## 2022-12-30 DIAGNOSIS — Z1152 Encounter for screening for COVID-19: Secondary | ICD-10-CM | POA: Insufficient documentation

## 2022-12-30 DIAGNOSIS — F23 Brief psychotic disorder: Secondary | ICD-10-CM | POA: Diagnosis not present

## 2022-12-30 LAB — COMPREHENSIVE METABOLIC PANEL
ALT: 36 U/L (ref 0–44)
AST: 25 U/L (ref 15–41)
Albumin: 3.7 g/dL (ref 3.5–5.0)
Alkaline Phosphatase: 117 U/L (ref 38–126)
Anion gap: 12 (ref 5–15)
BUN: 12 mg/dL (ref 6–20)
CO2: 27 mmol/L (ref 22–32)
Calcium: 9.6 mg/dL (ref 8.9–10.3)
Chloride: 103 mmol/L (ref 98–111)
Creatinine, Ser: 1.03 mg/dL — ABNORMAL HIGH (ref 0.44–1.00)
GFR, Estimated: >60 mL/min (ref >60)
Glucose, Bld: 99 mg/dL (ref 70–99)
Potassium: 3.9 mmol/L (ref 3.5–5.1)
Sodium: 142 mmol/L (ref 135–145)
Total Bilirubin: 0.2 mg/dL — ABNORMAL LOW (ref 0.3–1.2)
Total Protein: 7.1 g/dL (ref 6.5–8.1)

## 2022-12-30 LAB — CBC WITH DIFFERENTIAL/PLATELET
Abs Immature Granulocytes: 0.04 10*3/uL (ref 0.00–0.07)
Basophils Absolute: 0.1 10*3/uL (ref 0.0–0.1)
Basophils Relative: 1 %
Eosinophils Absolute: 0.2 10*3/uL (ref 0.0–0.5)
Eosinophils Relative: 2 %
HCT: 42.3 % (ref 36.0–46.0)
Hemoglobin: 13.3 g/dL (ref 12.0–15.0)
Immature Granulocytes: 0 %
Lymphocytes Relative: 17 %
Lymphs Abs: 1.7 10*3/uL (ref 0.7–4.0)
MCH: 29.6 pg (ref 26.0–34.0)
MCHC: 31.4 g/dL (ref 30.0–36.0)
MCV: 94 fL (ref 80.0–100.0)
Monocytes Absolute: 0.8 10*3/uL (ref 0.1–1.0)
Monocytes Relative: 9 %
Neutro Abs: 6.8 10*3/uL (ref 1.7–7.7)
Neutrophils Relative %: 71 %
Platelets: 337 10*3/uL (ref 150–400)
RBC: 4.5 MIL/uL (ref 3.87–5.11)
RDW: 15.3 % (ref 11.5–15.5)
WBC: 9.7 10*3/uL (ref 4.0–10.5)
nRBC: 0 % (ref 0.0–0.2)

## 2022-12-30 LAB — RAPID URINE DRUG SCREEN, HOSP PERFORMED
Amphetamines: NOT DETECTED
Barbiturates: NOT DETECTED
Benzodiazepines: NOT DETECTED
Cocaine: NOT DETECTED
Opiates: NOT DETECTED
Tetrahydrocannabinol: POSITIVE — AB

## 2022-12-30 LAB — HCG, SERUM, QUALITATIVE: Preg, Serum: NEGATIVE

## 2022-12-30 LAB — SALICYLATE LEVEL: Salicylate Lvl: <7 mg/dL — ABNORMAL LOW (ref 7.0–30.0)

## 2022-12-30 LAB — ACETAMINOPHEN LEVEL: Acetaminophen (Tylenol), Serum: <10 ug/mL — ABNORMAL LOW (ref 10–30)

## 2022-12-30 LAB — ETHANOL: Alcohol, Ethyl (B): <10 mg/dL (ref <10)

## 2022-12-30 MED ORDER — OLANZAPINE 10 MG PO TABS
10.0000 mg | ORAL_TABLET | Freq: Every day | ORAL | Status: DC
  Administered 2022-12-30 – 2022-12-31 (×2): 10 mg via ORAL
  Filled 2022-12-30 (×2): qty 1

## 2022-12-30 MED ORDER — LORAZEPAM 1 MG PO TABS: 1.0000 mg | ORAL_TABLET | Freq: Two times a day (BID) | ORAL | Status: DC

## 2022-12-30 MED ORDER — HYDROXYZINE HCL 25 MG PO TABS
25.0000 mg | ORAL_TABLET | Freq: Three times a day (TID) | ORAL | Status: DC | PRN
  Administered 2022-12-30 – 2022-12-31 (×2): 25 mg via ORAL
  Filled 2022-12-30 (×2): qty 1

## 2022-12-30 MED ORDER — ONDANSETRON 4 MG PO TBDP: 4.0000 mg | ORAL_TABLET | Freq: Three times a day (TID) | ORAL | Status: DC | PRN

## 2022-12-30 MED ORDER — FAMOTIDINE 20 MG PO TABS
40.0000 mg | ORAL_TABLET | Freq: Every day | ORAL | Status: DC
  Administered 2022-12-30 – 2022-12-31 (×2): 40 mg via ORAL
  Filled 2022-12-30 (×2): qty 2

## 2022-12-30 MED ORDER — ALBUTEROL SULFATE HFA 108 (90 BASE) MCG/ACT IN AERS
1.0000 | INHALATION_SPRAY | Freq: Once | RESPIRATORY_TRACT | Status: AC
  Administered 2022-12-31: 1 via RESPIRATORY_TRACT
  Filled 2022-12-30: qty 6.7

## 2022-12-30 MED ORDER — PANTOPRAZOLE SODIUM 40 MG PO TBEC
40.0000 mg | DELAYED_RELEASE_TABLET | Freq: Every day | ORAL | Status: DC
  Administered 2022-12-31: 40 mg via ORAL
  Filled 2022-12-30: qty 1

## 2022-12-30 MED ORDER — HYDROXYZINE PAMOATE 25 MG PO CAPS: 25.0000 mg | ORAL_CAPSULE | Freq: Three times a day (TID) | ORAL | Status: DC | PRN

## 2022-12-30 MED ORDER — DICYCLOMINE HCL 20 MG PO TABS
10.0000 mg | ORAL_TABLET | Freq: Three times a day (TID) | ORAL | Status: DC
  Administered 2022-12-31 (×3): 10 mg via ORAL
  Filled 2022-12-30 (×3): qty 1

## 2022-12-30 MED ORDER — CLONAZEPAM 0.5 MG PO TABS
1.0000 mg | ORAL_TABLET | Freq: Three times a day (TID) | ORAL | Status: DC
  Administered 2022-12-30 – 2022-12-31 (×4): 1 mg via ORAL
  Filled 2022-12-30 (×4): qty 1

## 2022-12-30 MED ORDER — BUSPIRONE HCL 10 MG PO TABS
10.0000 mg | ORAL_TABLET | Freq: Three times a day (TID) | ORAL | Status: DC
  Administered 2022-12-30 – 2022-12-31 (×4): 10 mg via ORAL
  Filled 2022-12-30 (×4): qty 1

## 2022-12-30 NOTE — ED Triage Notes (Signed)
Patient arrived POV. Patient reports SI, no plan. No HI. reports Takes medications for depression and anxiety. States has been taking as normal. Out of one medication. AAOX4, respirations even and unlabored. NAD. Denies any other symptoms

## 2022-12-30 NOTE — ED Provider Notes (Signed)
I was contacted saying that the patient was trying to leave.  She has suicidal ideation with previous history of suicide attempts.  She has worsening depression with visual hallucinations but at times appears to have acute psychosis.  Was evaluated by TTS and recommended for inpatient treatment.  For this reason IVC was placed.  Patient stable at time of IVC commitment.   Rozelle Logan, DO 12/30/22 2341

## 2022-12-30 NOTE — ED Notes (Signed)
Pt has been dressed out into burgundy scrubs. Pt belongings have been placed in the cabinet in triage.

## 2022-12-30 NOTE — ED Provider Notes (Addendum)
[place] EMERGENCY DEPARTMENT AT [facility] Provider Note   CSN: [id number] Arrival date & time: 12/30/22  1550    History  Chief Complaint  Patient presents with   Suicidal    [name] is a 42 y.o. female with past hx of asthma, etoh use here for evaluation of SI. Thoughts over the last few days. No specific plan however thoughts are "getting worse." No illlicet substance use. Nightmares of seeing things. No AVH when awake. On home psych meds however trying to taper off due to weight gain.  HPI     Home Medications Prior to Admission medications   Medication Sig Start Date End Date Taking? Authorizing Provider  busPIRone (BUSPAR) 10 MG tablet Take 10 mg by mouth 3 (three) times daily. 08/11/21  Yes [provider]  clonazePAM (KLONOPIN) 1 MG tablet Take 1 mg by mouth 3 (three) times daily as needed for anxiety. 12/30/22  Yes [provider]  dicyclomine (BENTYL) 10 MG capsule Take 10 mg by mouth 3 (three) times daily before meals.   Yes [provider]  famotidine (PEPCID) 40 MG tablet Take 1 tablet (40 mg total) by mouth at bedtime. 08/30/22  Yes Arnaldo Natal, NP  fluticasone-salmeterol (ADVAIR HFA) 230-21 MCG/ACT inhaler INHALE 2 PUFFS INTO THE LUNGS TWICE A DAY Patient taking differently: Inhale 2 puffs into the lungs 2 (two) times daily. 06/12/22  Yes Cobb, Ruby Cola, NP  hydrOXYzine (VISTARIL) 25 MG capsule Take 1 capsule (25 mg total) by mouth 3 (three) times daily as needed for anxiety. 06/22/22  Yes Narda Bonds, MD  ibuprofen (ADVIL) 200 MG tablet Take 400 mg by mouth every 6 (six) hours as needed for mild pain or headache.   Yes [provider]  losartan (COZAAR) 50 MG tablet Take 50 mg by mouth daily.   Yes [provider]  methylphenidate 27 MG PO CR tablet Take 27 mg by mouth in the morning.   Yes [provider]  montelukast (SINGULAIR) 10 MG tablet Take 10 mg by mouth at bedtime.  12/15/20  Yes [provider]  OLANZapine (ZYPREXA) 10 MG tablet Take 10 mg by mouth at bedtime. 08/13/21  Yes [provider]  pantoprazole (PROTONIX) 40 MG tablet TAKE 1 TABLET BY MOUTH EVERY DAY Patient taking differently: Take 40 mg by mouth daily before breakfast. 12/14/20  Yes Charlott Holler, MD  solifenacin (VESICARE) 5 MG tablet Take 5 mg by mouth daily. 10/02/22  Yes [provider]  Tiotropium Bromide Monohydrate (SPIRIVA RESPIMAT) 2.5 MCG/ACT AERS Inhale 2 puffs into the lungs daily. 06/07/22  Yes Cobb, Ruby Cola, NP  loratadine (CLARITIN) 10 MG tablet Take 1 tablet (10 mg total) by mouth daily. 06/22/22 10/16/22  Narda Bonds, MD  LORazepam (ATIVAN) 1 MG tablet Take 1 mg by mouth 2 (two) times daily. Patient not taking: Reported on 12/30/2022 09/30/22 09/30/23  [provider]  ondansetron (ZOFRAN-ODT) 4 MG disintegrating tablet Take 1 tablet (4 mg total) by mouth every 8 (eight) hours as needed for nausea or vomiting. Patient not taking: Reported on 10/16/2022 09/10/22   Small, Brooke L, PA  venlafaxine XR (EFFEXOR-XR) 75 MG 24 hr capsule Take 1 tablet by mouth daily. Patient not taking: Reported on 12/30/2022 12/30/22   [provider]      Allergies    Alprazolam, Oxycodone, Penicillins, Norvasc [amlodipine besylate], and Prednisone    Review of Systems   Review of Systems  Constitutional: Negative.  HENT: Negative.    Respiratory: Negative.    Cardiovascular: Negative.   Gastrointestinal: Negative.   Genitourinary: Negative.   Musculoskeletal: Negative.   Skin: Negative.   Psychiatric/Behavioral:  Positive for sleep disturbance. Negative for agitation, behavioral problems, confusion and self-injury. The patient is nervous/anxious.   All other systems reviewed and are negative.   Physical Exam Updated Vital Signs BP (!) 138/102   Pulse (!) 102   Temp 98.9 F (37.2 C)   Resp 20   Ht 5\' 4"  (1.626 m)   Wt 81.6 kg   LMP  12/16/2022 (Approximate)   SpO2 95%   BMI 30.90 kg/m  Physical Exam Vitals and nursing note reviewed.  Constitutional:      General: She is not in acute distress.    Appearance: She is well-developed. She is not ill-appearing, toxic-appearing or diaphoretic.  HENT:     Head: Atraumatic.  Eyes:     Pupils: Pupils are equal, round, and reactive to light.  Cardiovascular:     Rate and Rhythm: Normal rate.     Pulses: Normal pulses.     Heart sounds: Normal heart sounds.  Pulmonary:     Effort: Pulmonary effort is normal. No respiratory distress.     Breath sounds: Normal breath sounds.  Abdominal:     General: Bowel sounds are normal. There is no distension.     Palpations: Abdomen is soft.     Tenderness: There is no abdominal tenderness. There is no guarding or rebound.  Musculoskeletal:        General: Normal range of motion.     Cervical back: Normal range of motion.  Skin:    General: Skin is warm and dry.  Neurological:     General: No focal deficit present.     Mental Status: She is alert.  Psychiatric:        Attention and Perception: Attention and perception normal.        Mood and Affect: Mood is depressed.        Speech: Speech normal.        Behavior: Behavior is slowed. Behavior is cooperative.        Thought Content: Thought content is not paranoid or delusional. Thought content includes suicidal ideation. Thought content does not include homicidal ideation. Thought content does not include homicidal or suicidal plan.        Cognition and Memory: Cognition normal.     ED Results / Procedures / Treatments   Labs (all labs ordered are listed, but only abnormal results are displayed) Labs Reviewed  COMPREHENSIVE METABOLIC PANEL - Abnormal; Notable for the following components:      Result Value   Creatinine, Ser 1.03 (*)    Total Bilirubin 0.2 (*)    All other components within normal limits  RAPID URINE DRUG SCREEN, HOSP PERFORMED - Abnormal; Notable for  the following components:   Tetrahydrocannabinol POSITIVE (*)    All other components within normal limits  SALICYLATE LEVEL - Abnormal; Notable for the following components:   Salicylate Lvl <7.0 (*)    All other components within normal limits  ACETAMINOPHEN LEVEL - Abnormal; Notable for the following components:   Acetaminophen (Tylenol), Serum <10 (*)    All other components within normal limits  ETHANOL  CBC WITH DIFFERENTIAL/PLATELET  HCG, SERUM, QUALITATIVE    EKG None  Radiology No results found.  Procedures Procedures    Medications Ordered in ED Medications - No data to display  ED Course/  Medical Decision Making/ A&P    42 year old here for evaluation of suicidal thoughts.  Worsening depression over the last few days.  No specific plan however with thoughts that are "getting worse."  She denies any prior suicide attempts.  No HI, AVH.  Denies any drug use, alcohol use.  Has been in her normal state of health.  She has noted that she has been trying to cut down on her antidepressants as these have caused her to gain weight.  She is not currently followed by psychiatry states she gets her meds from her PCP.  Labs personally viewed and interpreted No significant findings  Patient medically cleared. Dispo per Psych                               Medical Decision Making Amount and/or Complexity of Data Reviewed External Data Reviewed: labs, radiology and notes. Labs: ordered. Decision-making details documented in ED Course.  Risk OTC drugs. Decision regarding hospitalization. Diagnosis or treatment significantly limited by social determinants of health.           Final Clinical Impression(s) / ED Diagnoses Final diagnoses:  Suicidal ideation  Depression, unspecified depression type    Rx / DC Orders ED Discharge Orders     None         ,  A, PA-C 12/30/22 1820    Horton, Clabe Seal, DO 12/30/22 2045    ,   A, PA-C 12/30/22 2110    Horton, Clabe Seal, DO 12/31/22 0004
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
## 2022-12-31 DIAGNOSIS — F332 Major depressive disorder, recurrent severe without psychotic features: Secondary | ICD-10-CM

## 2022-12-31 LAB — SARS CORONAVIRUS 2 BY RT PCR: SARS Coronavirus 2 by RT PCR: NEGATIVE

## 2022-12-31 MED ORDER — GUAIFENESIN 100 MG/5ML PO LIQD
5.0000 mL | Freq: Every day | ORAL | Status: DC | PRN
  Administered 2022-12-31: 5 mL via ORAL
  Filled 2022-12-31: qty 10

## 2022-12-31 MED ORDER — ALBUTEROL SULFATE HFA 108 (90 BASE) MCG/ACT IN AERS: 2.0000 | INHALATION_SPRAY | Freq: Four times a day (QID) | RESPIRATORY_TRACT | Status: DC | PRN

## 2022-12-31 MED ORDER — BENZONATATE 100 MG PO CAPS
100.0000 mg | ORAL_CAPSULE | Freq: Three times a day (TID) | ORAL | Status: DC | PRN
  Administered 2022-12-31: 100 mg via ORAL
  Filled 2022-12-31 (×2): qty 1

## 2022-12-31 MED ORDER — PHENOL 1.4 % MT LIQD
1.0000 | OROMUCOSAL | Status: DC | PRN
  Administered 2022-12-31: 1 via OROMUCOSAL
  Filled 2022-12-31: qty 177

## 2022-12-31 NOTE — ED Notes (Signed)
Pt took 2 puffs of her inhaler at this time.

## 2022-12-31 NOTE — Progress Notes (Signed)
Pt was accepted to [facility]) Address: [address] [place], [place] [address] TODAY 12/31/2022; [facility]  Pt meets inpatient criteria per Earney Navy, NP-PMHNP-BC   Attending Physician will be Dr. Lucianne Muss  Report can be called to: - [phone]  Pt can arrive after: BED IS READY NOW  Care Team notified: Night [facility] Fransico Michael, RN, Night [facility] Alv[facility] Chapel ([address] F[place], Chinwendu Onuoha V,NP, Lav[facility] Monarch, Brianne Ore,Paramedic.   Kelton Pillar, LCSWA 12/31/2022 @ 11:27 PM

## 2022-12-31 NOTE — Progress Notes (Signed)
Pt meets inpatient behavioral health placement per Lavonna Monarch. CSW has requested [facility] Zachary George.  Maryjean Ka, MSW, LCSWA 12/31/2022 1:28 AM

## 2022-12-31 NOTE — Consult Note (Signed)
BH ED ASSESSMENT   Reason for Consult:  Psychiatry evaluation Referring Physician:  ER Physician Patient Identification: [name] MRN:  [id number] ED Chief Complaint: MDD (major depressive disorder), recurrent severe, without psychosis (HCC)  Diagnosis:  Principal Problem:   MDD (major depressive disorder), recurrent severe, without psychosis (HCC)   ED Assessment Time Calculation: Start Time: 1843 Stop Time: 1906 Total Time in Minutes (Assessment Completion): 23   Subjective:   [name] is a 42 y.o. female patient admitted with hx of Depression, anxiety and Alcohol use disorder came to the ER with complaint of increased episode of Depression and  suicide ideation with no plan.  HPI:  Patient was seen earlier today by SW and she is waiting for bed assignment.  Patient rates her depression 8/10 with 10 being severe depression.  Patient reports her stressors includes her son leaving her to go back to [place].  Patient was tearful talking about her son.  Patient reports that she has been compliant with her medications.  Patient denies any drug use except for Cannabis which she uses often.  Patient admits to using alcohol but states due to her past alcoholism she is mindful of how much alcohol to drink.   Patient is alert and oriented x4, she is well groomed and nourished.  She currently denies feeling suicidal but want treatment for depression before everything gets worse.  Patient denies SI/HI/AVH and no mention of paranoia.  We have resumed home medications and faxed out records for available bed.  Past Psychiatric History: hx of Depression, anxiety and Alcohol use disorder.  Patient admits to OD suicide attempt six years ago.  Patient receives Mental health at [facility].  Risk to Self or Others: Is the patient at risk to self? No Has the patient been a risk to self in the past 6 months? No Has the patient been a risk to self within the distant past? No Is the patient a risk  to others? No Has the patient been a risk to others in the past 6 months? No Has the patient been a risk to others within the distant past? No  [place] Scale:  Flowsheet Row ED from 12/30/2022 in [facility] Emergency Department at [facility] ED from 10/16/2022 in [facility] Emergency Department at [facility] ED from 09/10/2022 in [facility] Emergency Department at [facility]  C-SSRS RISK CATEGORY High Risk No Risk No Risk       AIMS:  , , ,  ,   ASAM: ASAM Multidimensional Assessment Summary Dimension 1:  Description of individual's past and current experiences of substance use and withdrawal: n/a DImension 1:  Acute Intoxication and/or Withdrawal Potential Severity Rating: None Dimension 2:  Description of patient's biomedical conditions and  complications: n/a Dimension 2:  Biomedical Conditions and Complications Severity Rating: None Dimension 3:  Description of emotional, behavioral, or cognitive conditions and complications: n/a Dimension 3:  Emotional, behavioral or cognitive (EBC) conditions and complications severity rating: None Dimension 4:  Description of Readiness to Change criteria: n/a Dimension 4:  Readiness to Change Severity Rating: None Dimension 5:  Relapse, continued use, or continued problem potential critiera description: n/a Dimension 5:  Relapse, continued use, or continued problem potential severity rating: None Dimension 6:  Recovery/Iiving environment criteria description: n/a Dimension 6:  Recovery/living environment severity rating: None ASAM's Severity Rating Score: 0 ASAM Recommended Level of Treatment:  (n/a)  Substance Abuse:  Alcohol / Drug Use Pain Medications: See [facility]  Prescriptions: See MAR Over the Counter: See MAR History of alcohol / drug use?: Yes Longest period of sobriety (when/how long): Unknown Negative Consequences of Use:  (Denies) Withdrawal Symptoms: None  Past Medical History:  Past Medical History:   Diagnosis Date   Anxiety    Asthma    Depression    doing good, not on meds now   GERD (gastroesophageal reflux disease)    Headache    migraines   History of kidney stones    Hypertension    Kidney stones    Missed abortion 10/02/2013   Ovarian cyst    Pregnancy induced hypertension    Suicidal intent    Vaginal Pap smear, abnormal    bx, f/u ok    Past Surgical History:  Procedure Laterality Date   CESAREAN SECTION     CESAREAN SECTION N/A 06/06/2015   Procedure: CESAREAN SECTION;  Surgeon: Kathreen Cosier, MD;  Location: [facility] ORS;  Service: Obstetrics;  Laterality: N/A;   CHOLECYSTECTOMY N/A 08/23/2016   Procedure: LAPAROSCOPIC CHOLECYSTECTOMY;  Surgeon: Berna Bue, MD;  Location: [facility] OR;  Service: General;  Laterality: N/A;   DILATION AND CURETTAGE OF UTERUS     DILATION AND EVACUATION N/A 10/04/2013   Procedure: DILATATION AND EVACUATION;  Surgeon: Sherian Rein, MD;  Location: [facility] ORS;  Service: Gynecology;  Laterality: N/A;  [place] in room    TONSILLECTOMY     TUBAL LIGATION     Family History:  Family History  Problem Relation Age of Onset   Hearing loss Mother    Asthma Mother    Asthma Father    Diabetes Son    Cancer Maternal Grandfather        bone   Liver disease Maternal Grandfather    Asthma Sister    Lupus Sister    Family Psychiatric  History: unknown Social History:  Social History   Substance and Sexual Activity  Alcohol Use Yes   Alcohol/week: 4.0 standard drinks of alcohol   Types: 4 Cans of beer per week     Social History   Substance and Sexual Activity  Drug Use No    Social History   Socioeconomic History   Marital status: Single    Spouse name: Not on file   Number of children: Not on file   Years of education: Not on file   Highest education level: Not on file  Occupational History   Not on file  Tobacco Use   Smoking status: Every Day    Current packs/day: 0.25    Average packs/day: 0.3 packs/day for 15.0 years  (3.8 ttl pk-yrs)    Types: Cigarettes   Smokeless tobacco: Never   Tobacco comments:    Smoking 2-3 cigs a day. 03/07/2022 Tay  Vaping Use   Vaping status: Never Used  Substance and Sexual Activity   Alcohol use: Yes    Alcohol/week: 4.0 standard drinks of alcohol    Types: 4 Cans of beer per week   Drug use: No   Sexual activity: Yes    Partners: Male    Birth control/protection: Surgical  Other Topics Concern   Not on file  Social History Narrative   Not on file   Social Determinants of Health   Financial Resource Strain: Medium Risk (10/01/2022)   Received from [facility]   Overall Financial Resource Strain (CARDIA)    Difficulty of Paying Living Expenses: Somewhat hard  Food Insecurity: No Food Insecurity (10/01/2022)   Received  from [facility], [facility]   Hunger Vital Sign    Worried About Running Out of Food in the Last Year: Never true    Ran Out of Food in the Last Year: Never true  Transportation Needs: No Transportation Needs (10/01/2022)   Received from [facility], [facility]   PRAPARE - Transportation    Lack of Transportation (Medical): No    Lack of Transportation (Non-Medical): No  Physical Activity: Insufficiently Active (10/01/2022)   Received from [facility], [facility]   Exercise Vital Sign    Days of Exercise per Week: 2 days    Minutes of Exercise per Session: 30 min  Stress: Stress Concern Present (10/01/2022)   Received from [facility] - Occupational Stress Questionnaire    Feeling of Stress : To some extent  Social Connections: Socially Integrated (10/01/2022)   Received from [facility], [facility]   Social Network    How would you rate your social network (family, work, friends)?: Good participation with social networks   Additional Social History:    Allergies:   Allergies  Allergen Reactions   Alprazolam Itching   Oxycodone Itching    Penicillins Nausea And Vomiting and Other (See Comments)    Has patient had a PCN reaction causing immediate rash, facial/tongue/throat swelling, SOB or lightheadedness with hypotension:No Has patient had a PCN reaction causing severe rash involving mucus membranes or skin necrosis:No Has patient had a PCN reaction that REACTION THAT REQUIRED HOSPITALIZATION:  #  #  #  YES  #  #  #  Has patient had a PCN reaction occurring within the last 10 years: #  #  #  YES  #  #  #    Norvasc [Amlodipine Besylate] Other (See Comments)    Tip of tongue became numb and caused flushing   Prednisone Other (See Comments) and Swelling    FACIAL swelling- breathing not affected, though    Labs:  Results for orders placed or performed during the hospital encounter of 12/30/22 (from the past 48 hour(s))  Urine rapid drug screen (hosp performed)     Status: Abnormal   Collection Time: 12/30/22  4:31 PM  Result Value Ref Range   Opiates NONE DETECTED NONE DETECTED   Cocaine NONE DETECTED NONE DETECTED   Benzodiazepines NONE DETECTED NONE DETECTED   Amphetamines NONE DETECTED NONE DETECTED   Tetrahydrocannabinol POSITIVE (A) NONE DETECTED   Barbiturates NONE DETECTED NONE DETECTED    Comment: (NOTE) DRUG SCREEN FOR MEDICAL PURPOSES ONLY.  IF CONFIRMATION IS NEEDED FOR ANY PURPOSE, NOTIFY LAB WITHIN 5 DAYS.  LOWEST DETECTABLE LIMITS FOR URINE DRUG SCREEN Drug Class                     Cutoff (ng/mL) Amphetamine and metabolites    1000 Barbiturate and metabolites    200 Benzodiazepine                 200 Opiates and metabolites        300 Cocaine and metabolites        300 THC                            50 Performed at [facility], [address], [place], [place] [address]   Comprehensive metabolic panel     Status: Abnormal   Collection Time: 12/30/22  4:44  PM  Result Value Ref Range   Sodium 142 135 - 145 mmol/L   Potassium 3.9 3.5 - 5.1 mmol/L   Chloride 103 98 - 111 mmol/L    CO2 27 22 - 32 mmol/L   Glucose, Bld 99 70 - 99 mg/dL    Comment: Glucose reference range applies only to samples taken after fasting for at least 8 hours.   BUN 12 6 - 20 mg/dL   Creatinine, Ser 3.66 (H) 0.44 - 1.00 mg/dL   Calcium 9.6 8.9 - 44.0 mg/dL   Total Protein 7.1 6.5 - 8.1 g/dL   Albumin 3.7 3.5 - 5.0 g/dL   AST 25 15 - 41 U/L   ALT 36 0 - 44 U/L   Alkaline Phosphatase 117 38 - 126 U/L   Total Bilirubin 0.2 (L) 0.3 - 1.2 mg/dL   GFR, Estimated >34 >74 mL/min    Comment: (NOTE) Calculated using the CKD-EPI Creatinine Equation (2021)    Anion gap 12 5 - 15    Comment: Performed at [facility], [address] [place], [place] [address]  Ethanol     Status: None   Collection Time: 12/30/22  4:44 PM  Result Value Ref Range   Alcohol, Ethyl (B) <10 <10 mg/dL    Comment: (NOTE) Lowest detectable limit for serum alcohol is 10 mg/dL.  For medical purposes only. Performed at [facility], [address] [place], [place] [address]   CBC with Diff     Status: None   Collection Time: 12/30/22  4:44 PM  Result Value Ref Range   WBC 9.7 4.0 - 10.5 K/uL   RBC 4.50 3.87 - 5.11 MIL/uL   Hemoglobin 13.3 12.0 - 15.0 g/dL   HCT 43.3 29.5 - 18.8 %   MCV 94.0 80.0 - 100.0 fL   MCH 29.6 26.0 - 34.0 pg   MCHC 31.4 30.0 - 36.0 g/dL   RDW 41.6 60.6 - 30.1 %   Platelets 337 150 - 400 K/uL   nRBC 0.0 0.0 - 0.2 %   Neutrophils Relative % 71 %   Neutro Abs 6.8 1.7 - 7.7 K/uL   Lymphocytes Relative 17 %   Lymphs Abs 1.7 0.7 - 4.0 K/uL   Monocytes Relative 9 %   Monocytes Absolute 0.8 0.1 - 1.0 K/uL   Eosinophils Relative 2 %   Eosinophils Absolute 0.2 0.0 - 0.5 K/uL   Basophils Relative 1 %   Basophils Absolute 0.1 0.0 - 0.1 K/uL   Immature Granulocytes 0 %   Abs Immature Granulocytes 0.04 0.00 - 0.07 K/uL    Comment: Performed at [facility], [address], [place], [place] [address]  hCG, serum, qualitative      Status: None   Collection Time: 12/30/22  4:44 PM  Result Value Ref Range   Preg, Serum NEGATIVE NEGATIVE    Comment:        THE SENSITIVITY OF THIS METHODOLOGY IS >10 mIU/mL. Performed at [facility], [address], [place], [place] [address]   Salicylate level     Status: Abnormal   Collection Time: 12/30/22  4:44 PM  Result Value Ref Range   Salicylate Lvl <7.0 (L) 7.0 - 30.0 mg/dL    Comment: Performed at [facility], [address], [place], [place] [address]  Acetaminophen level     Status: Abnormal   Collection Time: 12/30/22  4:44 PM  Result Value Ref Range  Acetaminophen (Tylenol), Serum <10 (L) 10 - 30 ug/mL    Comment: (NOTE) Therapeutic concentrations vary significantly. A range of 10-30 ug/mL  may be an effective concentration for many patients. However, some  are best treated at concentrations outside of this range. Acetaminophen concentrations >150 ug/mL at 4 hours after ingestion  and >50 ug/mL at 12 hours after ingestion are often associated with  toxic reactions.  Performed at [facility], [address], [place], [place] [address]   SARS Coronavirus 2 by RT PCR (hospital order, performed in [facility] hospital lab) *cepheid single result test* Anterior Nasal Swab     Status: None   Collection Time: 12/31/22  6:27 PM   Specimen: Anterior Nasal Swab  Result Value Ref Range   SARS Coronavirus 2 by RT PCR NEGATIVE NEGATIVE    Comment: (NOTE) SARS-CoV-2 target nucleic acids are NOT DETECTED.  The SARS-CoV-2 RNA is generally detectable in upper and lower respiratory specimens during the acute phase of infection. The lowest concentration of SARS-CoV-2 viral copies this assay can detect is 250 copies / mL. A negative result does not preclude SARS-CoV-2 infection and should not be used as the sole basis for treatment or other patient management decisions.  A negative result may occur with improper  specimen collection / handling, submission of specimen other than nasopharyngeal swab, presence of viral mutation(s) within the areas targeted by this assay, and inadequate number of viral copies (<250 copies / mL). A negative result must be combined with clinical observations, patient history, and epidemiological information.  Fact Sheet for Patients:   RoadLapTop.co.za  Fact Sheet for Healthcare Providers: http://kim-miller.com/  This test is not yet approved or  cleared by the [place] FDA and has been authorized for detection and/or diagnosis of SARS-CoV-2 by FDA under an Emergency Use Authorization (EUA).  This EUA will remain in effect (meaning this test can be used) for the duration of the COVID-19 declaration under Section 564(b)(1) of the Act, 21 U.S.C. section 360bbb-3(b)(1), unless the authorization is terminated or revoked sooner.  Performed at [facility], [address] [place], [place] [address]     Current Facility-Administered Medications  Medication Dose Route Frequency Provider Last Rate Last Admin   albuterol (VENTOLIN HFA) 108 (90 Base) MCG/ACT inhaler 2 puff  2 puff Inhalation Q6H PRN Terald Sleeper, MD       benzonatate (TESSALON) capsule 100 mg  100 mg Oral TID PRN Rondel Baton, MD   100 mg at 12/31/22 1245   busPIRone (BUSPAR) tablet 10 mg  10 mg Oral TID Horton, Kristie M, DO   10 mg at 12/31/22 1628   clonazePAM (KLONOPIN) tablet 1 mg  1 mg Oral TID Horton, Kristie M, DO   1 mg at 12/31/22 1608   dicyclomine (BENTYL) tablet 10 mg  10 mg Oral TID AC Horton, Kristie M, DO   10 mg at 12/31/22 1628   famotidine (PEPCID) tablet 40 mg  40 mg Oral QHS Horton, Kristie M, DO   40 mg at 12/30/22 2211   guaiFENesin (ROBITUSSIN) 100 MG/5ML liquid 5 mL  5 mL Oral Daily PRN Terald Sleeper, MD       hydrOXYzine (ATARAX) tablet 25 mg  25 mg Oral TID PRN Rozelle Logan, DO   25 mg at  12/30/22 2211   OLANZapine (ZYPREXA) tablet 10 mg  10 mg Oral QHS Horton, Kristie M, DO   10 mg at 12/30/22 2211  pantoprazole (PROTONIX) EC tablet 40 mg  40 mg Oral QAC breakfast Horton, Kristie M, DO   40 mg at 12/31/22 1610   phenol (CHLORASEPTIC) mouth spray 1 spray  1 spray Mouth/Throat PRN Rondel Baton, MD   1 spray at 12/31/22 1510   Current Outpatient Medications  Medication Sig Dispense Refill   busPIRone (BUSPAR) 10 MG tablet Take 10 mg by mouth 3 (three) times daily.     clonazePAM (KLONOPIN) 1 MG tablet Take 1 mg by mouth 3 (three) times daily as needed for anxiety.     dicyclomine (BENTYL) 10 MG capsule Take 10 mg by mouth 3 (three) times daily before meals.     famotidine (PEPCID) 40 MG tablet Take 1 tablet (40 mg total) by mouth at bedtime. 30 tablet 1   fluticasone-salmeterol (ADVAIR HFA) 230-21 MCG/ACT inhaler INHALE 2 PUFFS INTO THE LUNGS TWICE A DAY (Patient taking differently: Inhale 2 puffs into the lungs 2 (two) times daily.) 12 each 5   hydrOXYzine (VISTARIL) 25 MG capsule Take 1 capsule (25 mg total) by mouth 3 (three) times daily as needed for anxiety. 30 capsule 0   ibuprofen (ADVIL) 200 MG tablet Take 400 mg by mouth every 6 (six) hours as needed for mild pain or headache.     losartan (COZAAR) 50 MG tablet Take 50 mg by mouth daily.     methylphenidate 27 MG PO CR tablet Take 27 mg by mouth in the morning.     montelukast (SINGULAIR) 10 MG tablet Take 10 mg by mouth at bedtime.     OLANZapine (ZYPREXA) 10 MG tablet Take 10 mg by mouth at bedtime.     pantoprazole (PROTONIX) 40 MG tablet TAKE 1 TABLET BY MOUTH EVERY DAY (Patient taking differently: Take 40 mg by mouth daily before breakfast.) 30 tablet 0   solifenacin (VESICARE) 5 MG tablet Take 5 mg by mouth daily.     Tiotropium Bromide Monohydrate (SPIRIVA RESPIMAT) 2.5 MCG/ACT AERS Inhale 2 puffs into the lungs daily. 1 each 5   loratadine (CLARITIN) 10 MG tablet Take 1 tablet (10 mg total) by mouth daily.  30 tablet 2   LORazepam (ATIVAN) 1 MG tablet Take 1 mg by mouth 2 (two) times daily. (Patient not taking: Reported on 12/30/2022)     ondansetron (ZOFRAN-ODT) 4 MG disintegrating tablet Take 1 tablet (4 mg total) by mouth every 8 (eight) hours as needed for nausea or vomiting. (Patient not taking: Reported on 10/16/2022) 20 tablet 0   venlafaxine XR (EFFEXOR-XR) 75 MG 24 hr capsule Take 1 tablet by mouth daily. (Patient not taking: Reported on 12/30/2022)      Musculoskeletal: Strength & Muscle Tone: within normal limits Gait & Station: normal Patient leans: Front   Psychiatric Specialty Exam: Presentation  General Appearance:  Fairly Groomed; Casual  Eye Contact: Good  Speech: Clear and Coherent; Normal Rate  Speech Volume: Normal  Handedness: Right   Mood and Affect  Mood: Depressed  Affect: Congruent; Tearful   Thought Process  Thought Processes: Goal Directed; Coherent  Descriptions of Associations:Intact  Orientation:Full (Time, Place and Person)  Thought Content:Logical  History of Schizophrenia/Schizoaffective disorder:No  Duration of Psychotic Symptoms:Less than six months  Hallucinations:Hallucinations: None  Ideas of Reference:None  Suicidal Thoughts:Suicidal Thoughts: No  Homicidal Thoughts:Homicidal Thoughts: No   Sensorium  Memory: Immediate Good; Recent Good; Remote Good  Judgment: Good  Insight: Good   Executive Functions  Concentration: Good  Attention Span: Good  Recall: Good  Fund of  Knowledge: Good  Language: Good   Psychomotor Activity  Psychomotor Activity: Psychomotor Activity: Normal   Assets  Assets: Communication Skills; Desire for Improvement; Housing    Sleep  Sleep: Sleep: Fair   Physical Exam: Physical Exam Vitals and nursing note reviewed.  HENT:     Nose: Nose normal.  Cardiovascular:     Rate and Rhythm: Normal rate and regular rhythm.  Pulmonary:     Effort: Pulmonary effort  is normal.  Musculoskeletal:        General: Normal range of motion.     Cervical back: Normal range of motion.  Skin:    General: Skin is dry.  Neurological:     Mental Status: She is alert and oriented to person, place, and time.  Psychiatric:        Attention and Perception: Attention and perception normal.        Mood and Affect: Mood is anxious and depressed. Affect is tearful.        Speech: Speech normal.        Behavior: Behavior normal. Behavior is cooperative.        Thought Content: Thought content includes suicidal ideation.        Cognition and Memory: Cognition and memory normal.        Judgment: Judgment is impulsive.    Review of Systems  Constitutional: Negative.   HENT: Negative.    Eyes: Negative.   Respiratory: Negative.    Cardiovascular: Negative.   Gastrointestinal: Negative.   Genitourinary: Negative.   Musculoskeletal: Negative.   Skin: Negative.   Neurological: Negative.   Endo/Heme/Allergies: Negative.   Psychiatric/Behavioral:  Positive for depression, substance abuse and suicidal ideas. The patient is nervous/anxious.    Blood pressure (!) 140/101, pulse 86, temperature 98.7 F (37.1 C), temperature source Oral, resp. rate 18, height 5\' 4"  (1.626 m), weight 81.6 kg, last menstrual period 12/16/2022, SpO2 96%. Body mass index is 30.9 kg/m.  Medical Decision Making: Patient requires a bed in the inpatient mental health setting for safety and stabilization.  She has a hx of past attempt and if not treated she will use Alcohol to treat herself.  We have faxed out records seeking bed placement.  We have resumed home Medications.  Problem 1: Recurrent Major Depressive disorder, severe without Psychotic features. 1843 Problem 2: Suicide ideation  Disposition:  admit, seek placement.  Earney Navy, NP-PMHNP-BC 12/31/2022 7:07 PM

## 2022-12-31 NOTE — ED Notes (Signed)
Lunch tray given to pt at bedside.

## 2023-01-01 ENCOUNTER — Encounter (HOSPITAL_COMMUNITY): Payer: Self-pay | Admitting: Emergency Medicine

## 2023-01-01 ENCOUNTER — Inpatient Hospital Stay
Admission: AD | Admit: 2023-01-01 | Discharge: 2023-01-06 | DRG: 885 | Disposition: A | Discharge status: Home / Self Care | Payer: Medicaid Other | Source: Intra-hospital | Attending: Psychiatry | Admitting: Psychiatry

## 2023-01-01 ENCOUNTER — Encounter: Payer: Self-pay | Admitting: Psychiatry

## 2023-01-01 ENCOUNTER — Other Ambulatory Visit: Payer: Self-pay

## 2023-01-01 ENCOUNTER — Ambulatory Visit (HOSPITAL_COMMUNITY)
Admission: EM | Admit: 2023-01-01 | Discharge: 2023-01-01 | Disposition: A | Discharge status: Other Acute Inpatient Hospital | Payer: Medicaid Other | Source: Home / Self Care

## 2023-01-01 DIAGNOSIS — Z5941 Food insecurity: Secondary | ICD-10-CM

## 2023-01-01 DIAGNOSIS — Z880 Allergy status to penicillin: Secondary | ICD-10-CM

## 2023-01-01 DIAGNOSIS — R440 Auditory hallucinations: Secondary | ICD-10-CM | POA: Diagnosis present

## 2023-01-01 DIAGNOSIS — Z885 Allergy status to narcotic agent status: Secondary | ICD-10-CM | POA: Diagnosis not present

## 2023-01-01 DIAGNOSIS — F332 Major depressive disorder, recurrent severe without psychotic features: Secondary | ICD-10-CM | POA: Diagnosis present

## 2023-01-01 DIAGNOSIS — R45851 Suicidal ideations: Secondary | ICD-10-CM | POA: Insufficient documentation

## 2023-01-01 DIAGNOSIS — Z5986 Financial insecurity: Secondary | ICD-10-CM

## 2023-01-01 DIAGNOSIS — F23 Brief psychotic disorder: Secondary | ICD-10-CM | POA: Insufficient documentation

## 2023-01-01 DIAGNOSIS — Z91148 Patient's other noncompliance with medication regimen for other reason: Secondary | ICD-10-CM | POA: Diagnosis not present

## 2023-01-01 DIAGNOSIS — Z888 Allergy status to other drugs, medicaments and biological substances status: Secondary | ICD-10-CM | POA: Diagnosis not present

## 2023-01-01 DIAGNOSIS — Z79899 Other long term (current) drug therapy: Secondary | ICD-10-CM | POA: Diagnosis not present

## 2023-01-01 DIAGNOSIS — F909 Attention-deficit hyperactivity disorder, unspecified type: Secondary | ICD-10-CM | POA: Diagnosis present

## 2023-01-01 DIAGNOSIS — Z825 Family history of asthma and other chronic lower respiratory diseases: Secondary | ICD-10-CM

## 2023-01-01 DIAGNOSIS — Z9151 Personal history of suicidal behavior: Secondary | ICD-10-CM | POA: Insufficient documentation

## 2023-01-01 DIAGNOSIS — Z23 Encounter for immunization: Secondary | ICD-10-CM

## 2023-01-01 DIAGNOSIS — F1010 Alcohol abuse, uncomplicated: Secondary | ICD-10-CM | POA: Diagnosis present

## 2023-01-01 DIAGNOSIS — F17210 Nicotine dependence, cigarettes, uncomplicated: Secondary | ICD-10-CM | POA: Insufficient documentation

## 2023-01-01 DIAGNOSIS — G4700 Insomnia, unspecified: Secondary | ICD-10-CM | POA: Diagnosis present

## 2023-01-01 DIAGNOSIS — Z87442 Personal history of urinary calculi: Secondary | ICD-10-CM

## 2023-01-01 DIAGNOSIS — Z833 Family history of diabetes mellitus: Secondary | ICD-10-CM

## 2023-01-01 DIAGNOSIS — R4587 Impulsiveness: Secondary | ICD-10-CM | POA: Insufficient documentation

## 2023-01-01 DIAGNOSIS — Z8269 Family history of other diseases of the musculoskeletal system and connective tissue: Secondary | ICD-10-CM

## 2023-01-01 DIAGNOSIS — Z7951 Long term (current) use of inhaled steroids: Secondary | ICD-10-CM

## 2023-01-01 DIAGNOSIS — Z91419 Personal history of unspecified adult abuse: Secondary | ICD-10-CM

## 2023-01-01 DIAGNOSIS — F410 Panic disorder [episodic paroxysmal anxiety] without agoraphobia: Secondary | ICD-10-CM

## 2023-01-01 DIAGNOSIS — Z822 Family history of deafness and hearing loss: Secondary | ICD-10-CM

## 2023-01-01 DIAGNOSIS — Z9049 Acquired absence of other specified parts of digestive tract: Secondary | ICD-10-CM

## 2023-01-01 DIAGNOSIS — I10 Essential (primary) hypertension: Secondary | ICD-10-CM | POA: Diagnosis present

## 2023-01-01 DIAGNOSIS — J4489 Other specified chronic obstructive pulmonary disease: Secondary | ICD-10-CM | POA: Diagnosis present

## 2023-01-01 DIAGNOSIS — F1210 Cannabis abuse, uncomplicated: Secondary | ICD-10-CM

## 2023-01-01 DIAGNOSIS — Z800 Family history of malignant neoplasm of digestive organs: Secondary | ICD-10-CM

## 2023-01-01 DIAGNOSIS — F17200 Nicotine dependence, unspecified, uncomplicated: Secondary | ICD-10-CM

## 2023-01-01 DIAGNOSIS — K219 Gastro-esophageal reflux disease without esophagitis: Secondary | ICD-10-CM | POA: Insufficient documentation

## 2023-01-01 DIAGNOSIS — Z818 Family history of other mental and behavioral disorders: Secondary | ICD-10-CM

## 2023-01-01 DIAGNOSIS — F339 Major depressive disorder, recurrent, unspecified: Secondary | ICD-10-CM | POA: Insufficient documentation

## 2023-01-01 MED ORDER — HALOPERIDOL LACTATE 5 MG/ML IJ SOLN: 5.0000 mg | Freq: Three times a day (TID) | INTRAMUSCULAR | Status: DC | PRN

## 2023-01-01 MED ORDER — LORAZEPAM 2 MG/ML IJ SOLN: 2.0000 mg | Freq: Three times a day (TID) | INTRAMUSCULAR | Status: DC | PRN

## 2023-01-01 MED ORDER — ONDANSETRON 4 MG PO TBDP: 4.0000 mg | ORAL_TABLET | Freq: Four times a day (QID) | ORAL | Status: DC | PRN

## 2023-01-01 MED ORDER — LORAZEPAM 1 MG PO TABS
1.0000 mg | ORAL_TABLET | Freq: Four times a day (QID) | ORAL | Status: DC | PRN
  Administered 2023-01-01: 1 mg via ORAL
  Filled 2023-01-01: qty 1

## 2023-01-01 MED ORDER — VENLAFAXINE HCL ER 75 MG PO CP24: 75.0000 mg | ORAL_CAPSULE | Freq: Every day | ORAL | Status: DC

## 2023-01-01 MED ORDER — TIOTROPIUM BROMIDE MONOHYDRATE 2.5 MCG/ACT IN AERS: 2.0000 | INHALATION_SPRAY | Freq: Every day | RESPIRATORY_TRACT | Status: DC

## 2023-01-01 MED ORDER — NICOTINE 21 MG/24HR TD PT24
21.0000 mg | MEDICATED_PATCH | Freq: Every day | TRANSDERMAL | Status: DC
  Administered 2023-01-02 – 2023-01-06 (×4): 21 mg via TRANSDERMAL
  Filled 2023-01-01 (×4): qty 1

## 2023-01-01 MED ORDER — PANTOPRAZOLE SODIUM 40 MG PO TBEC
40.0000 mg | DELAYED_RELEASE_TABLET | Freq: Every day | ORAL | Status: DC
  Administered 2023-01-01: 40 mg via ORAL
  Filled 2023-01-01: qty 1

## 2023-01-01 MED ORDER — LOPERAMIDE HCL 2 MG PO CAPS: 2.0000 mg | ORAL_CAPSULE | ORAL | Status: DC | PRN

## 2023-01-01 MED ORDER — HYDROXYZINE HCL 25 MG PO TABS: 25.0000 mg | ORAL_TABLET | Freq: Four times a day (QID) | ORAL | Status: DC | PRN

## 2023-01-01 MED ORDER — METHOCARBAMOL 500 MG PO TABS: 500.0000 mg | ORAL_TABLET | Freq: Three times a day (TID) | ORAL | Status: DC | PRN

## 2023-01-01 MED ORDER — DIPHENHYDRAMINE HCL 50 MG/ML IJ SOLN: 50.0000 mg | Freq: Three times a day (TID) | INTRAMUSCULAR | Status: DC | PRN

## 2023-01-01 MED ORDER — LOPERAMIDE HCL 2 MG PO CAPS: 2.0000 mg | ORAL_CAPSULE | ORAL | Status: AC | PRN

## 2023-01-01 MED ORDER — TRAZODONE HCL 50 MG PO TABS
50.0000 mg | ORAL_TABLET | Freq: Every evening | ORAL | Status: DC | PRN
  Administered 2023-01-01 – 2023-01-02 (×2): 50 mg via ORAL
  Filled 2023-01-01 (×2): qty 1

## 2023-01-01 MED ORDER — VENLAFAXINE HCL ER 75 MG PO CP24
75.0000 mg | ORAL_CAPSULE | Freq: Every day | ORAL | Status: DC
  Administered 2023-01-02 – 2023-01-06 (×5): 75 mg via ORAL
  Filled 2023-01-01 (×5): qty 1

## 2023-01-01 MED ORDER — FLUTICASONE FUROATE-VILANTEROL 200-25 MCG/ACT IN AEPB
1.0000 | INHALATION_SPRAY | Freq: Every day | RESPIRATORY_TRACT | Status: DC
  Filled 2023-01-01: qty 28

## 2023-01-01 MED ORDER — THIAMINE MONONITRATE 100 MG PO TABS: 100.0000 mg | ORAL_TABLET | Freq: Every day | ORAL | Status: DC

## 2023-01-01 MED ORDER — ADULT MULTIVITAMIN W/MINERALS CH
1.0000 | ORAL_TABLET | Freq: Every day | ORAL | Status: DC
  Administered 2023-01-02 – 2023-01-06 (×5): 1 via ORAL
  Filled 2023-01-01 (×5): qty 1

## 2023-01-01 MED ORDER — TIOTROPIUM BROMIDE MONOHYDRATE 18 MCG IN CAPS
18.0000 ug | ORAL_CAPSULE | Freq: Every day | RESPIRATORY_TRACT | Status: DC
  Administered 2023-01-02 – 2023-01-06 (×5): 18 ug via RESPIRATORY_TRACT
  Filled 2023-01-01: qty 5

## 2023-01-01 MED ORDER — ADULT MULTIVITAMIN W/MINERALS CH
1.0000 | ORAL_TABLET | Freq: Every day | ORAL | Status: DC
  Administered 2023-01-01: 1 via ORAL
  Filled 2023-01-01: qty 1

## 2023-01-01 MED ORDER — ALUM & MAG HYDROXIDE-SIMETH 200-200-20 MG/5ML PO SUSP: 30.0000 mL | ORAL | Status: DC | PRN

## 2023-01-01 MED ORDER — BENZONATATE 100 MG PO CAPS
100.0000 mg | ORAL_CAPSULE | Freq: Three times a day (TID) | ORAL | Status: DC | PRN
  Administered 2023-01-01: 100 mg via ORAL
  Filled 2023-01-01: qty 1

## 2023-01-01 MED ORDER — ONDANSETRON 4 MG PO TBDP: 4.0000 mg | ORAL_TABLET | Freq: Three times a day (TID) | ORAL | Status: DC | PRN

## 2023-01-01 MED ORDER — ONDANSETRON 4 MG PO TBDP: 4.0000 mg | ORAL_TABLET | Freq: Four times a day (QID) | ORAL | Status: AC | PRN

## 2023-01-01 MED ORDER — MAGNESIUM HYDROXIDE 400 MG/5ML PO SUSP: 30.0000 mL | Freq: Every day | ORAL | Status: DC | PRN

## 2023-01-01 MED ORDER — CLONIDINE HCL 0.1 MG PO TABS
0.1000 mg | ORAL_TABLET | Freq: Once | ORAL | Status: AC
  Administered 2023-01-01: 0.1 mg via ORAL
  Filled 2023-01-01: qty 1

## 2023-01-01 MED ORDER — THIAMINE MONONITRATE 100 MG PO TABS
100.0000 mg | ORAL_TABLET | Freq: Every day | ORAL | Status: DC
  Administered 2023-01-02 – 2023-01-06 (×5): 100 mg via ORAL
  Filled 2023-01-01 (×5): qty 1

## 2023-01-01 MED ORDER — MONTELUKAST SODIUM 10 MG PO TABS: 10.0000 mg | ORAL_TABLET | Freq: Every day | ORAL | Status: DC

## 2023-01-01 MED ORDER — NAPROXEN 500 MG PO TABS: 500.0000 mg | ORAL_TABLET | Freq: Two times a day (BID) | ORAL | Status: DC | PRN

## 2023-01-01 MED ORDER — LORAZEPAM 1 MG PO TABS
1.0000 mg | ORAL_TABLET | Freq: Four times a day (QID) | ORAL | Status: DC | PRN
  Administered 2023-01-02 – 2023-01-03 (×2): 1 mg via ORAL
  Filled 2023-01-01 (×3): qty 1

## 2023-01-01 MED ORDER — HALOPERIDOL 5 MG PO TABS: 5.0000 mg | ORAL_TABLET | Freq: Three times a day (TID) | ORAL | Status: DC | PRN

## 2023-01-01 MED ORDER — ALBUTEROL SULFATE HFA 108 (90 BASE) MCG/ACT IN AERS
1.0000 | INHALATION_SPRAY | Freq: Four times a day (QID) | RESPIRATORY_TRACT | Status: DC | PRN
  Administered 2023-01-03 – 2023-01-05 (×4): 2 via RESPIRATORY_TRACT
  Filled 2023-01-01: qty 6.7

## 2023-01-01 MED ORDER — DIPHENHYDRAMINE HCL 25 MG PO CAPS
50.0000 mg | ORAL_CAPSULE | Freq: Three times a day (TID) | ORAL | Status: DC | PRN
  Administered 2023-01-02: 50 mg via ORAL
  Filled 2023-01-01: qty 2

## 2023-01-01 MED ORDER — HYDROXYZINE HCL 25 MG PO TABS
25.0000 mg | ORAL_TABLET | Freq: Three times a day (TID) | ORAL | Status: DC | PRN
  Administered 2023-01-01 – 2023-01-06 (×5): 25 mg via ORAL
  Filled 2023-01-01 (×5): qty 1

## 2023-01-01 MED ORDER — LOSARTAN POTASSIUM 50 MG PO TABS
50.0000 mg | ORAL_TABLET | Freq: Every day | ORAL | Status: DC
  Administered 2023-01-01: 50 mg via ORAL
  Filled 2023-01-01: qty 1

## 2023-01-01 MED ORDER — OLANZAPINE 10 MG PO TBDP: 10.0000 mg | ORAL_TABLET | Freq: Three times a day (TID) | ORAL | Status: DC | PRN

## 2023-01-01 MED ORDER — ACETAMINOPHEN 325 MG PO TABS: 650.0000 mg | ORAL_TABLET | Freq: Four times a day (QID) | ORAL | Status: DC | PRN

## 2023-01-01 MED ORDER — UMECLIDINIUM BROMIDE 62.5 MCG/ACT IN AEPB
1.0000 | INHALATION_SPRAY | Freq: Every day | RESPIRATORY_TRACT | Status: DC
  Administered 2023-01-01: 1 via RESPIRATORY_TRACT
  Filled 2023-01-01: qty 7

## 2023-01-01 MED ORDER — ZIPRASIDONE MESYLATE 20 MG IM SOLR: 20.0000 mg | INTRAMUSCULAR | Status: DC | PRN

## 2023-01-01 MED ORDER — ALBUTEROL SULFATE HFA 108 (90 BASE) MCG/ACT IN AERS
2.0000 | INHALATION_SPRAY | Freq: Four times a day (QID) | RESPIRATORY_TRACT | Status: DC | PRN
  Administered 2023-01-01 (×2): 2 via RESPIRATORY_TRACT
  Filled 2023-01-01: qty 6.7

## 2023-01-01 MED ORDER — FLUTICASONE FUROATE-VILANTEROL 200-25 MCG/ACT IN AEPB
1.0000 | INHALATION_SPRAY | Freq: Every day | RESPIRATORY_TRACT | Status: DC
  Administered 2023-01-02 – 2023-01-06 (×5): 1 via RESPIRATORY_TRACT
  Filled 2023-01-01: qty 28

## 2023-01-01 MED ORDER — VENLAFAXINE HCL ER 75 MG PO CP24
75.0000 mg | ORAL_CAPSULE | Freq: Every day | ORAL | Status: DC
  Administered 2023-01-01: 75 mg via ORAL
  Filled 2023-01-01: qty 1

## 2023-01-01 MED ORDER — LORAZEPAM 2 MG PO TABS: 2.0000 mg | ORAL_TABLET | Freq: Three times a day (TID) | ORAL | Status: DC | PRN

## 2023-01-01 MED ORDER — PNEUMOCOCCAL 20-VAL CONJ VACC 0.5 ML IM SUSY
0.5000 mL | PREFILLED_SYRINGE | INTRAMUSCULAR | Status: AC
  Administered 2023-01-04: 0.5 mL via INTRAMUSCULAR
  Filled 2023-01-01 (×2): qty 0.5

## 2023-01-01 MED ORDER — NICOTINE 21 MG/24HR TD PT24: 21.0000 mg | MEDICATED_PATCH | Freq: Every day | TRANSDERMAL | Status: DC

## 2023-01-01 MED ORDER — NICOTINE 21 MG/24HR TD PT24
21.0000 mg | MEDICATED_PATCH | Freq: Every day | TRANSDERMAL | Status: DC
  Administered 2023-01-01: 21 mg via TRANSDERMAL
  Filled 2023-01-01: qty 1

## 2023-01-01 MED ORDER — ACETAMINOPHEN 325 MG PO TABS
650.0000 mg | ORAL_TABLET | Freq: Four times a day (QID) | ORAL | Status: DC | PRN
  Administered 2023-01-01: 650 mg via ORAL
  Filled 2023-01-01: qty 2

## 2023-01-01 MED ORDER — PANTOPRAZOLE SODIUM 40 MG PO TBEC
40.0000 mg | DELAYED_RELEASE_TABLET | Freq: Every day | ORAL | Status: DC
  Administered 2023-01-02 – 2023-01-06 (×5): 40 mg via ORAL
  Filled 2023-01-01 (×5): qty 1

## 2023-01-01 MED ORDER — LOSARTAN POTASSIUM 50 MG PO TABS
50.0000 mg | ORAL_TABLET | Freq: Every day | ORAL | Status: DC
  Administered 2023-01-02 – 2023-01-06 (×5): 50 mg via ORAL
  Filled 2023-01-01 (×6): qty 1

## 2023-01-01 MED ORDER — THIAMINE HCL 100 MG/ML IJ SOLN
100.0000 mg | Freq: Once | INTRAMUSCULAR | Status: AC
  Administered 2023-01-01: 100 mg via INTRAMUSCULAR
  Filled 2023-01-01: qty 2

## 2023-01-01 MED ORDER — DICYCLOMINE HCL 20 MG PO TABS: 20.0000 mg | ORAL_TABLET | Freq: Four times a day (QID) | ORAL | Status: DC | PRN

## 2023-01-01 NOTE — ED Notes (Signed)
[facility] called for patient transport

## 2023-01-01 NOTE — ED Notes (Signed)
[employer] here for pt. Pt calm, cooperative, & a/o. VSS. Safety maintained.

## 2023-01-01 NOTE — ED Notes (Signed)
Pt resting in bed at the current c eyes closed. Resident denies HI, SI, & AVH. Denies pain or any discomfort at this time. No s/s of acute distress observed. VSS. Safety maintained. Will continue to monitor and report any COC.

## 2023-01-01 NOTE — Group Note (Unsigned)
Date:  01/01/2023 Time:  10:59 PM  Group Topic/Focus:  Personal Choices and Values:   The focus of this group is to help patients assess and explore the importance of values in their lives, how their values affect their decisions, how they express their values and what opposes their expression.     Participation Level:  {BHH PARTICIPATION WUJWJ:19147}  Participation Quality:  {BHH PARTICIPATION QUALITY:22265}  Affect:  {BHH AFFECT:22266}  Cognitive:  {BHH COGNITIVE:22267}  Insight: {BHH Insight2:20797}  Engagement in Group:  {BHH ENGAGEMENT IN WGNFA:21308}  Modes of Intervention:  {BHH MODES OF INTERVENTION:22269}  Additional Comments:  ***  Lenore Cordia 01/01/2023, 10:59 PM

## 2023-01-01 NOTE — ED Provider Notes (Signed)
Patient has been accepted at [facility], accepting physician Nelly Rout MD.   Dione Booze, MD 01/01/23 [phone]

## 2023-01-01 NOTE — Tx Team (Signed)
Initial Treatment Plan 01/01/2023 6:48 PM [name] WGN:[id number]    PATIENT STRESSORS: Marital or family conflict   Medication change or noncompliance   Substance abuse     PATIENT STRENGTHS: Arboriculturist fund of knowledge  Motivation for treatment/growth  Work skills    PATIENT IDENTIFIED PROBLEMS: SI prior to admission  Depression  Anxiety  Medication noncompliance, pt. Reports medication wasn't working and it made her gain weight, she doesn't want to take Effexor any longer.  Substance use             DISCHARGE CRITERIA:  Ability to meet basic life and health needs Improved stabilization in mood, thinking, and/or behavior Need for constant or close observation no longer present Reduction of life-threatening or endangering symptoms to within safe limits Withdrawal symptoms are absent or subacute and managed without 24-hour nursing intervention  PRELIMINARY DISCHARGE PLAN: Outpatient therapy Return to previous living arrangement Return to previous work or school arrangements  PATIENT/FAMILY INVOLVEMENT: This treatment plan has been presented to and reviewed with the patient, [name]. The patient has been given the opportunity to ask questions and make suggestions.    , RN 01/01/2023, 6:48 PM

## 2023-01-01 NOTE — Group Note (Unsigned)
Date:  01/02/2023 Time:  12:07 AM  Group Topic/Focus:  Personal Choices and Values:   The focus of this group is to help patients assess and explore the importance of values in their lives, how their values affect their decisions, how they express their values and what opposes their expression.    Participation Level:  Active  Participation Quality:  Appropriate  Affect:  Appropriate  Cognitive:  Appropriate  Insight: Good  Engagement in Group:  Engaged  Modes of Intervention:  Discussion  Additional Comments:    Lenore Cordia 01/02/2023, 12:07 AM

## 2023-01-01 NOTE — ED Provider Notes (Signed)
[facility] Continuous Assessment Admission H&P  Date: 01/01/23 Patient Name: [name] MRN: [id number] Chief Complaint: increase depression with SI  Diagnoses:  Final diagnoses:  Suicidal ideation  Alcohol abuse  Recurrent major depressive disorder, remission status unspecified (HCC)    HPI: [name], 42 y/o female with a  history for Alcohol abuse,  MDD, and SI.  Present to [facility] as a transfer from the ED under IVC.  Per the IVC pt came in with Suicidal ideation wants to die, -previous sucidal attempts -usual hallucinating, acute psychosis  -worse depression -excessive drinking, skipping psychiatric medicine  Per the patient she needs hell for her depression and she want to get back on her medication.  When asked how long ago did she stop taking her medications.  Pt state a while ago,  per the patient she was prescribed her antidepressant by her PCP.  Pt denies seeing a psychiatric or therapist at this time.  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   Per the pt she drinks 3-4 beer each time,  3 time weekly.   Per the patient she was hospitalize before for SI 2-3 year ago.  Pt denies access to guns  Face to Face observation of patient,  she is alert and oriented x 4, speech is clear,  maintain eye contact.  Mood is depress and sleepy,  affect is flat congruent with mood.  Per the pt is not currently suicidal.  Pt denies HI,  pt report she was hearing voices a few night ago,  "I thought I heard my daughter voice in the room with me".  Pt report alcohol consumption of 3-4 beers each time 3 days weekly.  Pt denies access to guns,  report smoking cigarettes.  Denies any other illicit drug used.  Pt does not seem to be influence by internal or external stimuli at this time.  Will continue pt home medications.   Recommend inpatient observaton   Total Time spent with patient: 20 minutes  Musculoskeletal  Strength & Muscle Tone:  within normal limits Gait & Station: normal Patient leans: N/A  Psychiatric Specialty Exam  Presentation General Appearance:  Casual  Eye Contact: Good  Speech: Clear and Coherent  Speech Volume: Normal  Handedness: Right   Mood and Affect  Mood: Anxious; Depressed  Affect: Congruent   Thought Process  Thought Processes: Goal Directed  Descriptions of Associations:Intact  Orientation:Full (Time, Place and Person)  Thought Content:WDL  Diagnosis of Schizophrenia or Schizoaffective disorder in past: No   Hallucinations:Hallucinations: Auditory Description of Auditory Hallucinations: her daughter voice in her bed room  Ideas of Reference:None  Suicidal Thoughts:Suicidal Thoughts: Yes, Passive SI Passive Intent and/or Plan: Without Plan  Homicidal Thoughts:Homicidal Thoughts: No   Sensorium  Memory: Immediate Fair  Judgment: Fair  Insight: Good   Executive Functions  Concentration: Good  Attention Span: Good  Recall: Good  Fund of Knowledge: Good  Language: Good   Psychomotor Activity  Psychomotor Activity: Psychomotor Activity: Normal   Assets  Assets: Desire for Improvement; Resilience   Sleep  Sleep: Sleep: Fair Number of Hours of Sleep: 5   Nutritional Assessment (For OBS and [facility]BC admissions only) Has the patient had a weight loss or gain of 10 pounds or more in the last 3 months?: No Has the patient had a decrease in food intake/or appetite?: No Does the patient have dental problems?: No Does the patient have eating habits or behaviors  that may be indicators of an eating disorder including binging or inducing vomiting?: No Has the patient recently lost weight without trying?: 0 Has the patient been eating poorly because of a decreased appetite?: 0 Malnutrition Screening Tool Score: 0    Physical Exam HENT:     Head: Normocephalic.     Nose: Nose normal.  Cardiovascular:     Rate and Rhythm: Tachycardia  present.  Pulmonary:     Effort: Pulmonary effort is normal.  Musculoskeletal:        General: Normal range of motion.     Cervical back: Normal range of motion.  Neurological:     General: No focal deficit present.     Mental Status: She is alert.  Psychiatric:        Mood and Affect: Mood normal.        Behavior: Behavior normal.        Thought Content: Thought content normal.        Judgment: Judgment normal.    Review of Systems  Constitutional: Negative.   HENT: Negative.    Eyes: Negative.   Respiratory: Negative.    Cardiovascular: Negative.   Gastrointestinal: Negative.   Genitourinary: Negative.   Musculoskeletal: Negative.   Skin: Negative.   Neurological: Negative.   Psychiatric/Behavioral:  Positive for substance abuse and suicidal ideas. The patient is nervous/anxious.     Blood pressure (!) 142/110, pulse 80, temperature 98.7 F (37.1 C), temperature source Oral, resp. rate 20, last menstrual period 12/16/2022, SpO2 100%. There is no height or weight on file to calculate BMI.  Past Psychiatric History: SI,  MDD,  alcohol abuse   Is the patient at risk to self? Yes  Has the patient been a risk to self in the past 6 months? Yes .    Has the patient been a risk to self within the distant past? Yes   Is the patient a risk to others? No   Has the patient been a risk to others in the past 6 months? No   Has the patient been a risk to others within the distant past? No   Past Medical History: see chart   Family History: unknown   Social History: alcohol and tobacco use  Last Labs:  Admission on 12/30/2022, Discharged on 01/01/2023  Component Date Value Ref Range Status   Sodium 12/30/2022 142  135 - 145 mmol/L Final   Potassium 12/30/2022 3.9  3.5 - 5.1 mmol/L Final   Chloride 12/30/2022 103  98 - 111 mmol/L Final   CO2 12/30/2022 27  22 - 32 mmol/L Final   Glucose, Bld 12/30/2022 99  70 - 99 mg/dL Final   Glucose reference range applies only to samples  taken after fasting for at least 8 hours.   BUN 12/30/2022 12  6 - 20 mg/dL Final   Creatinine, Ser 12/30/2022 1.03 (H)  0.44 - 1.00 mg/dL Final   Calcium 60/63/0160 9.6  8.9 - 10.3 mg/dL Final   Total Protein 10/93/2355 7.1  6.5 - 8.1 g/dL Final   Albumin 73/22/0254 3.7  3.5 - 5.0 g/dL Final   AST 27/10/2374 25  15 - 41 U/L Final   ALT 12/30/2022 36  0 - 44 U/L Final   Alkaline Phosphatase 12/30/2022 117  38 - 126 U/L Final   Total Bilirubin 12/30/2022 0.2 (L)  0.3 - 1.2 mg/dL Final   GFR, Estimated 12/30/2022 >60  >60 mL/min Final   Comment: (NOTE) Calculated using the CKD-EPI Creatinine  Equation (2021)    Anion gap 12/30/2022 12  5 - 15 Final   Performed at [facility], [address] [place], [place] [address]   Alcohol, Ethyl (B) 12/30/2022 <10  <10 mg/dL Final   Comment: (NOTE) Lowest detectable limit for serum alcohol is 10 mg/dL.  For medical purposes only. Performed at [facility], [address] [place], [place] [address]    Opiates 12/30/2022 NONE DETECTED  NONE DETECTED Final   Cocaine 12/30/2022 NONE DETECTED  NONE DETECTED Final   Benzodiazepines 12/30/2022 NONE DETECTED  NONE DETECTED Final   Amphetamines 12/30/2022 NONE DETECTED  NONE DETECTED Final   Tetrahydrocannabinol 12/30/2022 POSITIVE (A)  NONE DETECTED Final   Barbiturates 12/30/2022 NONE DETECTED  NONE DETECTED Final   Comment: (NOTE) DRUG SCREEN FOR MEDICAL PURPOSES ONLY.  IF CONFIRMATION IS NEEDED FOR ANY PURPOSE, NOTIFY LAB WITHIN 5 DAYS.  LOWEST DETECTABLE LIMITS FOR URINE DRUG SCREEN Drug Class                     Cutoff (ng/mL) Amphetamine and metabolites    1000 Barbiturate and metabolites    200 Benzodiazepine                 200 Opiates and metabolites        300 Cocaine and metabolites        300 THC                            50 Performed at [facility], [address] [place], [place] [address]    WBC 12/30/2022 9.7  4.0  - 10.5 K/uL Final   RBC 12/30/2022 4.50  3.87 - 5.11 MIL/uL Final   Hemoglobin 12/30/2022 13.3  12.0 - 15.0 g/dL Final   HCT 41/66/0630 42.3  36.0 - 46.0 % Final   MCV 12/30/2022 94.0  80.0 - 100.0 fL Final   MCH 12/30/2022 29.6  26.0 - 34.0 pg Final   MCHC 12/30/2022 31.4  30.0 - 36.0 g/dL Final   RDW 16/05/930 15.3  11.5 - 15.5 % Final   Platelets 12/30/2022 337  150 - 400 K/uL Final   nRBC 12/30/2022 0.0  0.0 - 0.2 % Final   Neutrophils Relative % 12/30/2022 71  % Final   Neutro Abs 12/30/2022 6.8  1.7 - 7.7 K/uL Final   Lymphocytes Relative 12/30/2022 17  % Final   Lymphs Abs 12/30/2022 1.7  0.7 - 4.0 K/uL Final   Monocytes Relative 12/30/2022 9  % Final   Monocytes Absolute 12/30/2022 0.8  0.1 - 1.0 K/uL Final   Eosinophils Relative 12/30/2022 2  % Final   Eosinophils Absolute 12/30/2022 0.2  0.0 - 0.5 K/uL Final   Basophils Relative 12/30/2022 1  % Final   Basophils Absolute 12/30/2022 0.1  0.0 - 0.1 K/uL Final   Immature Granulocytes 12/30/2022 0  % Final   Abs Immature Granulocytes 12/30/2022 0.04  0.00 - 0.07 K/uL Final   Performed at [facility], [address] [place], [place] [address]   Preg, Serum 12/30/2022 NEGATIVE  NEGATIVE Final   Comment:        THE SENSITIVITY OF THIS METHODOLOGY IS >10 mIU/mL. Performed at [facility], [address] [place], [place] [address]    Salicylate Lvl 12/30/2022 <7.0 (L)  7.0 - 30.0 mg/dL Final   Performed at [facility],  [address] [place], [place] [address]   Acetaminophen (Tylenol), Serum 12/30/2022 <10 (L)  10 - 30 ug/mL Final   Comment: (NOTE) Therapeutic concentrations vary significantly. A range of 10-30 ug/mL  may be an effective concentration for many patients. However, some  are best treated at concentrations outside of this range. Acetaminophen concentrations >150 ug/mL at 4 hours after ingestion  and >50 ug/mL at 12 hours after ingestion are often  associated with  toxic reactions.  Performed at  [facility], [address], [place], [place] [address]    SARS Coronavirus 2 by RT PCR 12/31/2022 NEGATIVE  NEGATIVE Final   Comment: (NOTE) SARS-CoV-2 target nucleic acids are NOT DETECTED.  The SARS-CoV-2 RNA is generally detectable in upper and lower respiratory specimens during the acute phase of infection. The lowest concentration of SARS-CoV-2 viral copies this assay can detect is 250 copies / mL. A negative result does not preclude SARS-CoV-2 infection and should not be used as the sole basis for treatment or other patient management decisions.  A negative result may occur with improper specimen collection / handling, submission of specimen other than nasopharyngeal swab, presence of viral mutation(s) within the areas targeted by this assay, and inadequate number of viral copies (<250 copies / mL). A negative result must be combined with clinical observations, patient history, and epidemiological information.  Fact Sheet for Patients:   RoadLapTop.co.za  Fact Sheet for Healthcare Providers: http://kim-miller.com/  This test is not yet approved or                           cleared by the [place] FDA and has been authorized for detection and/or diagnosis of SARS-CoV-2 by FDA under an Emergency Use Authorization (EUA).  This EUA will remain in effect (meaning this test can be used) for the duration of the COVID-19 declaration under Section 564(b)(1) of the Act, 21 U.S.C. section 360bbb-3(b)(1), unless the authorization is terminated or revoked sooner.  Performed at [facility], [address] [place], [place] [address]   Admission on 10/16/2022, Discharged on 10/16/2022  Component Date Value Ref Range Status   Color, Urine 10/16/2022 YELLOW  YELLOW Final   APPearance 10/16/2022 CLEAR  CLEAR Final   Specific Gravity, Urine  10/16/2022 1.017  1.005 - 1.030 Final   pH 10/16/2022 6.0  5.0 - 8.0 Final   Glucose, UA 10/16/2022 NEGATIVE  NEGATIVE mg/dL Final   Hgb urine dipstick 10/16/2022 NEGATIVE  NEGATIVE Final   Bilirubin Urine 10/16/2022 NEGATIVE  NEGATIVE Final   Ketones, ur 10/16/2022 NEGATIVE  NEGATIVE mg/dL Final   Protein, ur 95/62/1308 30 (A)  NEGATIVE mg/dL Final   Nitrite 65/78/4696 NEGATIVE  NEGATIVE Final   Leukocytes,Ua 10/16/2022 NEGATIVE  NEGATIVE Final   RBC / HPF 10/16/2022 0-5  0 - 5 RBC/hpf Final   WBC, UA 10/16/2022 0-5  0 - 5 WBC/hpf Final   Bacteria, UA 10/16/2022 RARE (A)  NONE SEEN Final   Squamous Epithelial / HPF 10/16/2022 0-5  0 - 5 /HPF Final   Mucus 10/16/2022 PRESENT   Final   Performed at [facility], [address], [place], [place] [address]   Preg Test, Ur 10/16/2022 NEGATIVE  NEGATIVE Final   Comment:        THE SENSITIVITY OF THIS METHODOLOGY IS >20 mIU/mL. Performed at [facility], [address] [place], [place] [address]    Sodium 10/16/2022 135  135 - 145 mmol/L Final   Potassium 10/16/2022 4.5  3.5 - 5.1 mmol/L Final   Chloride 10/16/2022 102  98 - 111 mmol/L Final   CO2 10/16/2022 25  22 - 32 mmol/L Final   Glucose, Bld 10/16/2022 102 (H)  70 - 99 mg/dL Final   Glucose reference range applies only to samples taken after fasting for at least 8 hours.   BUN 10/16/2022 13  6 - 20 mg/dL Final   Creatinine, Ser 10/16/2022 1.07 (H)  0.44 - 1.00 mg/dL Final   Calcium 45/40/9811 9.3  8.9 - 10.3 mg/dL Final   Total Protein 91/47/8295 7.5  6.5 - 8.1 g/dL Final   Albumin 62/13/0865 4.1  3.5 - 5.0 g/dL Final   AST 78/46/9629 20  15 - 41 U/L Final   ALT 10/16/2022 14  0 - 44 U/L Final   Alkaline Phosphatase 10/16/2022 85  38 - 126 U/L Final   Total Bilirubin 10/16/2022 1.0  0.3 - 1.2 mg/dL Final   GFR, Estimated 10/16/2022 >60  >60 mL/min Final   Comment: (NOTE) Calculated using the CKD-EPI Creatinine Equation (2021)    Anion gap  10/16/2022 8  5 - 15 Final   Performed at [facility], [address] [place], [place] [address]   WBC 10/16/2022 9.5  4.0 - 10.5 K/uL Final   RBC 10/16/2022 4.99  3.87 - 5.11 MIL/uL Final   Hemoglobin 10/16/2022 14.8  12.0 - 15.0 g/dL Final   HCT 32/44/0102 45.2  36.0 - 46.0 % Final   MCV 10/16/2022 90.6  80.0 - 100.0 fL Final   MCH 10/16/2022 29.7  26.0 - 34.0 pg Final   MCHC 10/16/2022 32.7  30.0 - 36.0 g/dL Final   RDW 72/53/6644 15.1  11.5 - 15.5 % Final   Platelets 10/16/2022 316  150 - 400 K/uL Final   nRBC 10/16/2022 0.0  0.0 - 0.2 % Final   Neutrophils Relative % 10/16/2022 68  % Final   Neutro Abs 10/16/2022 6.4  1.7 - 7.7 K/uL Final   Lymphocytes Relative 10/16/2022 20  % Final   Lymphs Abs 10/16/2022 1.9  0.7 - 4.0 K/uL Final   Monocytes Relative 10/16/2022 8  % Final   Monocytes Absolute 10/16/2022 0.8  0.1 - 1.0 K/uL Final   Eosinophils Relative 10/16/2022 1  % Final   Eosinophils Absolute 10/16/2022 0.1  0.0 - 0.5 K/uL Final   Basophils Relative 10/16/2022 2  % Final   Basophils Absolute 10/16/2022 0.1  0.0 - 0.1 K/uL Final   Immature Granulocytes 10/16/2022 1  % Final   Abs Immature Granulocytes 10/16/2022 0.07  0.00 - 0.07 K/uL Final   Performed at [facility], [address], [place], [place] [address]   Lipase 10/16/2022 28  11 - 51 U/L Final   Performed at [facility], [address] [place], [place] [address]   Specimen Description 10/16/2022    Final                   Value:URINE, CLEAN CATCH Performed at [facility], [address] [place], [place] [address]    Special Requests 10/16/2022    Final                   Value:NONE Performed at [facility], [address] [place], [place] [address]    Culture 10/16/2022 MULTIPLE SPECIES PRESENT, SUGGEST RECOLLECTION (A)   Final  Report Status 10/16/2022 10/17/2022 FINAL   Final  Admission on 09/10/2022,  Discharged on 09/10/2022  Component Date Value Ref Range Status   WBC 09/10/2022 15.7 (H)  4.0 - 10.5 K/uL Final   RBC 09/10/2022 5.56 (H)  3.87 - 5.11 MIL/uL Final   Hemoglobin 09/10/2022 16.6 (H)  12.0 - 15.0 g/dL Final   HCT 40/98/1191 52.0 (H)  36.0 - 46.0 % Final   MCV 09/10/2022 93.5  80.0 - 100.0 fL Final   MCH 09/10/2022 29.9  26.0 - 34.0 pg Final   MCHC 09/10/2022 31.9  30.0 - 36.0 g/dL Final   RDW 47/82/9562 14.7  11.5 - 15.5 % Final   Platelets 09/10/2022 369  150 - 400 K/uL Final   nRBC 09/10/2022 0.0  0.0 - 0.2 % Final   Performed at [facility], [address], [place], [place] [address]   Color, Urine 09/10/2022 RED (A)  YELLOW Final   BIOCHEMICALS MAY BE AFFECTED BY COLOR   APPearance 09/10/2022 CLOUDY (A)  CLEAR Final   Specific Gravity, Urine 09/10/2022 1.025  1.005 - 1.030 Final   pH 09/10/2022 5.0  5.0 - 8.0 Final   Glucose, UA 09/10/2022 NEGATIVE  NEGATIVE mg/dL Final   Hgb urine dipstick 09/10/2022 LARGE (A)  NEGATIVE Final   Bilirubin Urine 09/10/2022 NEGATIVE  NEGATIVE Final   Ketones, ur 09/10/2022 NEGATIVE  NEGATIVE mg/dL Final   Protein, ur 57/84/6962 >=300 (A)  NEGATIVE mg/dL Final   Nitrite 95/28/4132 NEGATIVE  NEGATIVE Final   Leukocytes,Ua 09/10/2022 TRACE (A)  NEGATIVE Final   RBC / HPF 09/10/2022 >50  0 - 5 RBC/hpf Final   WBC, UA 09/10/2022 21-50  0 - 5 WBC/hpf Final   Bacteria, UA 09/10/2022 NONE SEEN  NONE SEEN Final   Squamous Epithelial / HPF 09/10/2022 6-10  0 - 5 /HPF Final   Mucus 09/10/2022 PRESENT   Final   Performed at [facility], [address], [place], [place] [address]   I-stat hCG, quantitative 09/10/2022 <5.0  <5 mIU/mL Final   Comment 3 09/10/2022          Final   Comment:   GEST. AGE      CONC.  (mIU/mL)   <=1 WEEK        5 - 50     2 WEEKS       50 - 500     3 WEEKS       100 - 10,000     4 WEEKS     1,000 - 30,000        FEMALE AND NON-PREGNANT FEMALE:     LESS THAN 5 mIU/mL     Sodium 09/10/2022 137  135 - 145 mmol/L Final   Potassium 09/10/2022 4.2  3.5 - 5.1 mmol/L Final   Chloride 09/10/2022 104  98 - 111 mmol/L Final   CO2 09/10/2022 22  22 - 32 mmol/L Final   Glucose, Bld 09/10/2022 116 (H)  70 - 99 mg/dL Final   Glucose reference range applies only to samples taken after fasting for at least 8 hours.   BUN 09/10/2022 19  6 - 20 mg/dL Final   Creatinine, Ser 09/10/2022 0.96  0.44 - 1.00 mg/dL Final   Calcium 27/25/3664 8.3 (L)  8.9 - 10.3 mg/dL Final   Total Protein 40/34/7425 7.2  6.5 - 8.1 g/dL Final   Albumin 95/63/8756 3.8  3.5 - 5.0 g/dL Final   AST 43/32/9518 21  15 -  41 U/L Final   ALT 09/10/2022 14  0 - 44 U/L Final   Alkaline Phosphatase 09/10/2022 87  38 - 126 U/L Final   Total Bilirubin 09/10/2022 0.6  0.3 - 1.2 mg/dL Final   GFR, Estimated 09/10/2022 >60  >60 mL/min Final   Comment: (NOTE) Calculated using the CKD-EPI Creatinine Equation (2021)    Anion gap 09/10/2022 11  5 - 15 Final   Performed at [facility], [address], [place], [place] [address]   Lipase 09/10/2022 27  11 - 51 U/L Final   Performed at [facility], [address], [place], [place] [address]  Appointment on 08/30/2022  Component Date Value Ref Range Status   (tTG) Ab, IgG 08/30/2022 <1.0  U/mL Final   Comment: Value          Interpretation -----          -------------- <15.0          Antibody not detected > or = 15.0    Antibody detected .    (tTG) Ab, IgA 08/30/2022 <1.0  U/mL Final   Comment: Value          Interpretation -----          -------------- <15.0          Antibody not detected > or = 15.0    Antibody detected .    Immunoglobulin A 08/30/2022 136  47 - 310 mg/dL Final   CRP 13/12/6576 <1.0  0.5 - 20.0 mg/dL Final   TSH 46/96/2952 1.75  0.35 - 5.50 uIU/mL Final   Sodium 08/30/2022 139  135 - 145 mEq/L Final   Potassium 08/30/2022 4.2  3.5 - 5.1 mEq/L Final   Chloride 08/30/2022 103  96 - 112 mEq/L Final    CO2 08/30/2022 28  19 - 32 mEq/L Final   Glucose, Bld 08/30/2022 87  70 - 99 mg/dL Final   BUN 84/13/2440 8  6 - 23 mg/dL Final   Creatinine, Ser 08/30/2022 0.97  0.40 - 1.20 mg/dL Final   Total Bilirubin 08/30/2022 0.4  0.2 - 1.2 mg/dL Final   Alkaline Phosphatase 08/30/2022 96  39 - 117 U/L Final   AST 08/30/2022 17  0 - 37 U/L Final   ALT 08/30/2022 11  0 - 35 U/L Final   Total Protein 08/30/2022 7.2  6.0 - 8.3 g/dL Final   Albumin 03/16/2535 4.3  3.5 - 5.2 g/dL Final   GFR 64/40/3474 73.55  >60.00 mL/min Final   Calculated using the CKD-EPI Creatinine Equation (2021)   Calcium 08/30/2022 9.7  8.4 - 10.5 mg/dL Final   WBC 25/95/6387 9.1  4.0 - 10.5 K/uL Final   RBC 08/30/2022 4.98  3.87 - 5.11 Mil/uL Final   Hemoglobin 08/30/2022 15.0  12.0 - 15.0 g/dL Final   HCT 56/43/3295 45.2  36.0 - 46.0 % Final   MCV 08/30/2022 90.6  78.0 - 100.0 fl Final   MCHC 08/30/2022 33.3  30.0 - 36.0 g/dL Final   RDW 18/84/1660 15.5  11.5 - 15.5 % Final   Platelets 08/30/2022 345.0  150.0 - 400.0 K/uL Final   Neutrophils Relative % 08/30/2022 67.3  43.0 - 77.0 % Final   Lymphocytes Relative 08/30/2022 21.3  12.0 - 46.0 % Final   Monocytes Relative 08/30/2022 9.7  3.0 - 12.0 % Final   Eosinophils Relative 08/30/2022 0.8  0.0 - 5.0 % Final   Basophils Relative 08/30/2022 0.9  0.0 - 3.0 %  Final   Neutro Abs 08/30/2022 6.1  1.4 - 7.7 K/uL Final   Lymphs Abs 08/30/2022 1.9  0.7 - 4.0 K/uL Final   Monocytes Absolute 08/30/2022 0.9  0.1 - 1.0 K/uL Final   Eosinophils Absolute 08/30/2022 0.1  0.0 - 0.7 K/uL Final   Basophils Absolute 08/30/2022 0.1  0.0 - 0.1 K/uL Final    Allergies: Alprazolam, Oxycodone, Penicillins, Norvasc [amlodipine besylate], and Prednisone  Medications:  Facility Ordered Medications  Medication   acetaminophen (TYLENOL) tablet 650 mg   alum & mag hydroxide-simeth (MAALOX/MYLANTA) 200-200-20 MG/5ML suspension 30 mL   magnesium hydroxide (MILK OF MAGNESIA) suspension 30 mL    hydrOXYzine (ATARAX) tablet 25 mg   loperamide (IMODIUM) capsule 2-4 mg   ondansetron (ZOFRAN-ODT) disintegrating tablet 4 mg   OLANZapine zydis (ZYPREXA) disintegrating tablet 10 mg   And   ziprasidone (GEODON) injection 20 mg   [COMPLETED] thiamine (VITAMIN B1) injection 100 mg   [START ON 01/02/2023] thiamine (VITAMIN B1) tablet 100 mg   multivitamin with minerals tablet 1 tablet   LORazepam (ATIVAN) tablet 1 mg   hydrOXYzine (ATARAX) tablet 25 mg   losartan (COZAAR) tablet 50 mg   venlafaxine XR (EFFEXOR-XR) 24 hr capsule 75 mg   pantoprazole (PROTONIX) EC tablet 40 mg   [START ON 01/06/2023] ondansetron (ZOFRAN-ODT) disintegrating tablet 4 mg   montelukast (SINGULAIR) tablet 10 mg   fluticasone furoate-vilanterol (BREO ELLIPTA) 200-25 MCG/ACT 1 puff   umeclidinium bromide (INCRUSE ELLIPTA) 62.5 MCG/ACT 1 puff   benzonatate (TESSALON) capsule 100 mg   albuterol (VENTOLIN HFA) 108 (90 Base) MCG/ACT inhaler 2 puff   PTA Medications  Medication Sig   pantoprazole (PROTONIX) 40 MG tablet TAKE 1 TABLET BY MOUTH EVERY DAY (Patient taking differently: Take 40 mg by mouth daily before breakfast.)   montelukast (SINGULAIR) 10 MG tablet Take 10 mg by mouth at bedtime.   busPIRone (BUSPAR) 10 MG tablet Take 10 mg by mouth 3 (three) times daily.   OLANZapine (ZYPREXA) 10 MG tablet Take 10 mg by mouth at bedtime.   dicyclomine (BENTYL) 10 MG capsule Take 10 mg by mouth 3 (three) times daily before meals.   losartan (COZAAR) 50 MG tablet Take 50 mg by mouth daily.   Tiotropium Bromide Monohydrate (SPIRIVA RESPIMAT) 2.5 MCG/ACT AERS Inhale 2 puffs into the lungs daily.   fluticasone-salmeterol (ADVAIR HFA) 230-21 MCG/ACT inhaler INHALE 2 PUFFS INTO THE LUNGS TWICE A DAY (Patient taking differently: Inhale 2 puffs into the lungs 2 (two) times daily.)   methylphenidate 27 MG PO CR tablet Take 27 mg by mouth in the morning.   ibuprofen (ADVIL) 200 MG tablet Take 400 mg by mouth every 6 (six) hours as  needed for mild pain or headache.   loratadine (CLARITIN) 10 MG tablet Take 1 tablet (10 mg total) by mouth daily.   hydrOXYzine (VISTARIL) 25 MG capsule Take 1 capsule (25 mg total) by mouth 3 (three) times daily as needed for anxiety.   famotidine (PEPCID) 40 MG tablet Take 1 tablet (40 mg total) by mouth at bedtime.   ondansetron (ZOFRAN-ODT) 4 MG disintegrating tablet Take 1 tablet (4 mg total) by mouth every 8 (eight) hours as needed for nausea or vomiting. (Patient not taking: Reported on 10/16/2022)   LORazepam (ATIVAN) 1 MG tablet Take 1 mg by mouth 2 (two) times daily. (Patient not taking: Reported on 12/30/2022)   solifenacin (VESICARE) 5 MG tablet Take 5 mg by mouth daily.   clonazePAM (KLONOPIN) 1 MG tablet  Take 1 mg by mouth 3 (three) times daily as needed for anxiety.   venlafaxine XR (EFFEXOR-XR) 75 MG 24 hr capsule Take 1 tablet by mouth daily. (Patient not taking: Reported on 12/30/2022)      Medical Decision Making  Inpatient observation   Meds ordered this encounter  Medications   acetaminophen (TYLENOL) tablet 650 mg   alum & mag hydroxide-simeth (MAALOX/MYLANTA) 200-200-20 MG/5ML suspension 30 mL   magnesium hydroxide (MILK OF MAGNESIA) suspension 30 mL   DISCONTD: dicyclomine (BENTYL) tablet 20 mg   hydrOXYzine (ATARAX) tablet 25 mg   loperamide (IMODIUM) capsule 2-4 mg   DISCONTD: methocarbamol (ROBAXIN) tablet 500 mg   DISCONTD: naproxen (NAPROSYN) tablet 500 mg   ondansetron (ZOFRAN-ODT) disintegrating tablet 4 mg   AND Linked Order Group    OLANZapine zydis (ZYPREXA) disintegrating tablet 10 mg    ziprasidone (GEODON) injection 20 mg   thiamine (VITAMIN B1) injection 100 mg   thiamine (VITAMIN B1) tablet 100 mg   multivitamin with minerals tablet 1 tablet   LORazepam (ATIVAN) tablet 1 mg   hydrOXYzine (ATARAX) tablet 25 mg   DISCONTD: loperamide (IMODIUM) capsule 2-4 mg   DISCONTD: ondansetron (ZOFRAN-ODT) disintegrating tablet 4 mg   losartan (COZAAR)  tablet 50 mg   venlafaxine XR (EFFEXOR-XR) 24 hr capsule 75 mg   DISCONTD: Tiotropium Bromide Monohydrate AERS 2 puff   pantoprazole (PROTONIX) EC tablet 40 mg   ondansetron (ZOFRAN-ODT) disintegrating tablet 4 mg   montelukast (SINGULAIR) tablet 10 mg   fluticasone furoate-vilanterol (BREO ELLIPTA) 200-25 MCG/ACT 1 puff   umeclidinium bromide (INCRUSE ELLIPTA) 62.5 MCG/ACT 1 puff   benzonatate (TESSALON) capsule 100 mg   DISCONTD: Tiotropium Bromide Monohydrate AERS 2 puff   albuterol (VENTOLIN HFA) 108 (90 Base) MCG/ACT inhaler 2 puff    Lab Orders  No laboratory test(s) ordered today    Recommendations  Based on my evaluation the patient does not appear to have an emergency medical condition.  [name], NP 01/01/23  5:58 AM

## 2023-01-01 NOTE — Discharge Instructions (Signed)
Patient accepted to [facility]

## 2023-01-01 NOTE — Plan of Care (Signed)
New admission.  Problem: Education: Goal: Knowledge of General Education information will improve Description: Including pain rating scale, medication(s)/side effects and non-pharmacologic comfort measures Outcome: Not Progressing   Problem: Health Behavior/Discharge Planning: Goal: Ability to manage health-related needs will improve Outcome: Not Progressing   Problem: Clinical Measurements: Goal: Ability to maintain clinical measurements within normal limits will improve Outcome: Not Progressing Goal: Will remain free from infection Outcome: Not Progressing Goal: Diagnostic test results will improve Outcome: Not Progressing Goal: Respiratory complications will improve Outcome: Not Progressing Goal: Cardiovascular complication will be avoided Outcome: Not Progressing   Problem: Activity: Goal: Risk for activity intolerance will decrease Outcome: Not Progressing   Problem: Nutrition: Goal: Adequate nutrition will be maintained Outcome: Not Progressing   Problem: Coping: Goal: Level of anxiety will decrease Outcome: Not Progressing   Problem: Elimination: Goal: Will not experience complications related to bowel motility Outcome: Not Progressing Goal: Will not experience complications related to urinary retention Outcome: Not Progressing   Problem: Pain Managment: Goal: General experience of comfort will improve Outcome: Not Progressing   Problem: Safety: Goal: Ability to remain free from injury will improve Outcome: Not Progressing   Problem: Skin Integrity: Goal: Risk for impaired skin integrity will decrease Outcome: Not Progressing   Problem: Education: Goal: Knowledge of [place] General Education information/materials will improve Outcome: Not Progressing Goal: Emotional status will improve Outcome: Not Progressing Goal: Mental status will improve Outcome: Not Progressing Goal: Verbalization of understanding the information provided will  improve Outcome: Not Progressing   Problem: Activity: Goal: Interest or engagement in activities will improve Outcome: Not Progressing Goal: Sleeping patterns will improve Outcome: Not Progressing   Problem: Coping: Goal: Ability to verbalize frustrations and anger appropriately will improve Outcome: Not Progressing Goal: Ability to demonstrate self-control will improve Outcome: Not Progressing   Problem: Health Behavior/Discharge Planning: Goal: Identification of resources available to assist in meeting health care needs will improve Outcome: Not Progressing Goal: Compliance with treatment plan for underlying cause of condition will improve Outcome: Not Progressing   Problem: Physical Regulation: Goal: Ability to maintain clinical measurements within normal limits will improve Outcome: Not Progressing   Problem: Safety: Goal: Periods of time without injury will increase Outcome: Not Progressing   Problem: Coping: Goal: Coping ability will improve Outcome: Not Progressing Goal: Will verbalize feelings Outcome: Not Progressing   Problem: Self-Concept: Goal: Will verbalize positive feelings about self Outcome: Not Progressing Goal: Level of anxiety will decrease Outcome: Not Progressing   Problem: Health Behavior/Discharge Planning: Goal: Identification of resources available to assist in meeting health care needs will improve Outcome: Not Progressing   Problem: Self-Concept: Goal: Ability to disclose and discuss suicidal ideas will improve Outcome: Not Progressing Goal: Will verbalize positive feelings about self Outcome: Not Progressing

## 2023-01-01 NOTE — Progress Notes (Signed)
Pt was accepted to [facility] TODAY 01/01/2023, pending updated VS. Bed assignment: 313  Pt meets inpatient criteria per Karie Fetch, MD  Attending Physician will be Elane Fritz, DO  Report can be called to: [phone]  Pt can arrive after pending discharges  Care Team Notified: Rona Ravens, RN, Karie Fetch, MD, Earl Gala, LPN, and Doyce Para, RN  [place], [place]  01/01/2023 11:23 AM

## 2023-01-01 NOTE — Plan of Care (Signed)
Pt compliant with medication administration per MD orders and procedures on the unit  Problem: Education: Goal: Knowledge of General Education information will improve Description: Including pain rating scale, medication(s)/side effects and non-pharmacologic comfort measures Outcome: Not Progressing   Problem: Health Behavior/Discharge Planning: Goal: Ability to manage health-related needs will improve Outcome: Not Progressing   Problem: Clinical Measurements: Goal: Ability to maintain clinical measurements within normal limits will improve Outcome: Not Progressing Goal: Will remain free from infection Outcome: Not Progressing Goal: Diagnostic test results will improve Outcome: Not Progressing Goal: Respiratory complications will improve Outcome: Not Progressing Goal: Cardiovascular complication will be avoided Outcome: Not Progressing   Problem: Activity: Goal: Risk for activity intolerance will decrease Outcome: Not Progressing   Problem: Nutrition: Goal: Adequate nutrition will be maintained Outcome: Not Progressing   Problem: Coping: Goal: Level of anxiety will decrease Outcome: Not Progressing   Problem: Elimination: Goal: Will not experience complications related to bowel motility Outcome: Not Progressing Goal: Will not experience complications related to urinary retention Outcome: Not Progressing   Problem: Pain Managment: Goal: General experience of comfort will improve Outcome: Not Progressing   Problem: Safety: Goal: Ability to remain free from injury will improve Outcome: Not Progressing   Problem: Skin Integrity: Goal: Risk for impaired skin integrity will decrease Outcome: Not Progressing   Problem: Education: Goal: Knowledge of [place] General Education information/materials will improve Outcome: Not Progressing Goal: Emotional status will improve Outcome: Not Progressing Goal: Mental status will improve Outcome: Not Progressing Goal:  Verbalization of understanding the information provided will improve Outcome: Not Progressing   Problem: Activity: Goal: Interest or engagement in activities will improve Outcome: Not Progressing Goal: Sleeping patterns will improve Outcome: Not Progressing   Problem: Coping: Goal: Ability to verbalize frustrations and anger appropriately will improve Outcome: Not Progressing Goal: Ability to demonstrate self-control will improve Outcome: Not Progressing   Problem: Health Behavior/Discharge Planning: Goal: Identification of resources available to assist in meeting health care needs will improve Outcome: Not Progressing Goal: Compliance with treatment plan for underlying cause of condition will improve Outcome: Not Progressing   Problem: Physical Regulation: Goal: Ability to maintain clinical measurements within normal limits will improve Outcome: Not Progressing   Problem: Safety: Goal: Periods of time without injury will increase Outcome: Not Progressing   Problem: Coping: Goal: Coping ability will improve Outcome: Not Progressing Goal: Will verbalize feelings Outcome: Not Progressing   Problem: Self-Concept: Goal: Will verbalize positive feelings about self Outcome: Not Progressing Goal: Level of anxiety will decrease Outcome: Not Progressing   Problem: Health Behavior/Discharge Planning: Goal: Identification of resources available to assist in meeting health care needs will improve Outcome: Not Progressing   Problem: Self-Concept: Goal: Ability to disclose and discuss suicidal ideas will improve Outcome: Not Progressing Goal: Will verbalize positive feelings about self Outcome: Not Progressing

## 2023-01-01 NOTE — ED Notes (Signed)
Pt A&o x 4, under IVC, presents with suicidal ideations, no plan noted..  Pt reports struggling with depression and anxiety for the past 2 weeks..  Pt calm & cooperative, no distress noted.  Comfort measures given.  Monitoring for safety.

## 2023-01-01 NOTE — ED Notes (Addendum)
[employer] , has arrived for pt , pt appears to be in NAD at this time , RR even and unlabored, belongings and IVC paperwork given to [employer] H. Word. Pt now walking out with sheriff
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
## 2023-01-02 MED ORDER — CLONAZEPAM 1 MG PO TABS
1.0000 mg | ORAL_TABLET | Freq: Three times a day (TID) | ORAL | Status: DC | PRN
  Administered 2023-01-02 – 2023-01-05 (×7): 1 mg via ORAL
  Filled 2023-01-02 (×7): qty 1

## 2023-01-02 MED ORDER — DICYCLOMINE HCL 10 MG PO CAPS
10.0000 mg | ORAL_CAPSULE | Freq: Three times a day (TID) | ORAL | Status: DC
  Administered 2023-01-02 – 2023-01-05 (×10): 10 mg via ORAL
  Filled 2023-01-02 (×13): qty 1

## 2023-01-02 MED ORDER — NICOTINE 21 MG/24HR TD PT24: 21.0000 mg | MEDICATED_PATCH | Freq: Every day | TRANSDERMAL | Status: DC

## 2023-01-02 MED ORDER — BUSPIRONE HCL 5 MG PO TABS
10.0000 mg | ORAL_TABLET | Freq: Three times a day (TID) | ORAL | Status: DC
  Administered 2023-01-02 – 2023-01-06 (×12): 10 mg via ORAL
  Filled 2023-01-02 (×12): qty 2

## 2023-01-02 MED ORDER — VENLAFAXINE HCL ER 75 MG PO CP24: 75.0000 mg | ORAL_CAPSULE | Freq: Every day | ORAL | Status: DC

## 2023-01-02 MED ORDER — IBUPROFEN 200 MG PO TABS
400.0000 mg | ORAL_TABLET | Freq: Four times a day (QID) | ORAL | Status: DC | PRN
  Administered 2023-01-02 – 2023-01-05 (×4): 400 mg via ORAL
  Filled 2023-01-02 (×4): qty 2

## 2023-01-02 MED ORDER — LOSARTAN POTASSIUM 50 MG PO TABS: 50.0000 mg | ORAL_TABLET | Freq: Every day | ORAL | Status: DC

## 2023-01-02 MED ORDER — METHYLPHENIDATE HCL ER (OSM) 27 MG PO TBCR
27.0000 mg | EXTENDED_RELEASE_TABLET | Freq: Every morning | ORAL | Status: DC
  Administered 2023-01-03 – 2023-01-06 (×4): 27 mg via ORAL
  Filled 2023-01-02 (×4): qty 1

## 2023-01-02 MED ORDER — MONTELUKAST SODIUM 10 MG PO TABS
10.0000 mg | ORAL_TABLET | Freq: Every day | ORAL | Status: DC
  Administered 2023-01-02 – 2023-01-05 (×4): 10 mg via ORAL
  Filled 2023-01-02 (×5): qty 1

## 2023-01-02 MED ORDER — PANTOPRAZOLE SODIUM 40 MG PO TBEC: 40.0000 mg | DELAYED_RELEASE_TABLET | Freq: Every day | ORAL | Status: DC

## 2023-01-02 NOTE — Group Note (Signed)
Recreation Therapy Group Note   Group Topic:Healthy Support Systems  Group Date: 01/02/2023 Start Time: 1005 End Time: 1100 Facilitators: Rosina Lowenstein, LRT, CTRS Location:  Craft Room  Group Description: Straw Bridge.  Patients were given 10 plastic drinking straws and an equal length of masking tape. Using the materials provided, patients were instructed to build a free-standing bridge-like structure to suspend an everyday item (ex: deck of cards) off the floor or table surface. All materials were required to be used in Secondary school teacher. LRT facilitated post-activity discussion reviewing the importance of having strong and healthy support systems in our lives. LRT discussed how the people in our lives serve as the tape and the deck of cards we placed on top of our straw structure are the stressors we face in daily life. LRT and pts discussed what happens in our life when things get too heavy for [place], and we don't have strong supports outside of the hospital. Pt shared 2 of their healthy supports aloud in the group.    Goal Area(s) Addressed:  Patient will identify 2 healthy supports in their life. Patient will identify skills to successfully complete activity. Patient will identify correlation of this activity to life post-discharge.  Patient will work on Product manager.   Affect/Mood: Appropriate and Flat   Participation Level: Active and Engaged   Participation Quality: Independent   Behavior: Appropriate, Calm, and Cooperative   Speech/Thought Process: Coherent   Insight: Good   Judgement: Good   Modes of Intervention: Activity   Patient Response to Interventions:  Attentive, Engaged, Interested , and Receptive   Education Outcome:  Acknowledges education   Clinical Observations/Individualized Feedback: [name] was active in their participation of session activities and group discussion. Pt identified "my dad, sister and my kids" as her healthy supports. Pt  successfully completed activity with all given materials. Pt interacted well with LRT and peers duration of session.   Plan: Continue to engage patient in RT group sessions 2-3x/week.   Rosina Lowenstein, LRT, CTRS 01/02/2023 11:45 AM

## 2023-01-02 NOTE — Group Note (Signed)
Date:  01/02/2023 Time:  4:15 PM  Group Topic/Focus:  Activity Group:  The focus of this group is to promote activity and wellness for patients to get some fresh air and also get some exercise.    Participation Level:  Did Not Attend   [name] [name] 01/02/2023, 4:15 PM

## 2023-01-02 NOTE — Plan of Care (Signed)
Patient pleasant and cooperative on approach. Patient had PRN medication x 1 for anxiety. Patient appropriate with staff & peers. Denies SI,HI and AVH. Appetite and energy level good. ADLs maintained. Support and encouragement given.

## 2023-01-02 NOTE — Plan of Care (Signed)
Pt denies SI/HI/AVH, appropriate on the unit.   Problem: Education: Goal: Knowledge of General Education information will improve Description: Including pain rating scale, medication(s)/side effects and non-pharmacologic comfort measures Outcome: Progressing   Problem: Health Behavior/Discharge Planning: Goal: Ability to manage health-related needs will improve Outcome: Progressing   Problem: Clinical Measurements: Goal: Ability to maintain clinical measurements within normal limits will improve Outcome: Progressing Goal: Will remain free from infection Outcome: Progressing Goal: Diagnostic test results will improve Outcome: Progressing Goal: Respiratory complications will improve Outcome: Progressing Goal: Cardiovascular complication will be avoided Outcome: Progressing   Problem: Activity: Goal: Risk for activity intolerance will decrease Outcome: Progressing   Problem: Nutrition: Goal: Adequate nutrition will be maintained Outcome: Progressing   Problem: Coping: Goal: Level of anxiety will decrease Outcome: Progressing   Problem: Elimination: Goal: Will not experience complications related to bowel motility Outcome: Progressing Goal: Will not experience complications related to urinary retention Outcome: Progressing   Problem: Pain Managment: Goal: General experience of comfort will improve Outcome: Progressing   Problem: Safety: Goal: Ability to remain free from injury will improve Outcome: Progressing   Problem: Skin Integrity: Goal: Risk for impaired skin integrity will decrease Outcome: Progressing   Problem: Education: Goal: Knowledge of [place] [facility] Education information/materials will improve Outcome: Progressing Goal: Emotional status will improve Outcome: Progressing Goal: Mental status will improve Outcome: Progressing Goal: Verbalization of understanding the information provided will improve Outcome: Progressing   Problem:  Activity: Goal: Interest or engagement in activities will improve Outcome: Progressing Goal: Sleeping patterns will improve Outcome: Progressing   Problem: Coping: Goal: Ability to verbalize frustrations and anger appropriately will improve Outcome: Progressing Goal: Ability to demonstrate self-control will improve Outcome: Progressing   Problem: Health Behavior/Discharge Planning: Goal: Identification of resources available to assist in meeting health care needs will improve Outcome: Progressing Goal: Compliance with treatment plan for underlying cause of condition will improve Outcome: Progressing   Problem: Physical Regulation: Goal: Ability to maintain clinical measurements within normal limits will improve Outcome: Progressing   Problem: Safety: Goal: Periods of time without injury will increase Outcome: Progressing   Problem: Coping: Goal: Coping ability will improve Outcome: Progressing Goal: Will verbalize feelings Outcome: Progressing   Problem: Self-Concept: Goal: Will verbalize positive feelings about self Outcome: Progressing Goal: Level of anxiety will decrease Outcome: Progressing   Problem: Health Behavior/Discharge Planning: Goal: Identification of resources available to assist in meeting health care needs will improve Outcome: Progressing   Problem: Self-Concept: Goal: Ability to disclose and discuss suicidal ideas will improve Outcome: Progressing Goal: Will verbalize positive feelings about self Outcome: Progressing

## 2023-01-02 NOTE — Progress Notes (Signed)
Patient calm and pleasant during assessment denying SI/HI/AVH. Pt observed interacting appropriately with staff and peers on the unit. Pt compliant with medication administration per MD orders. Pt given education, support, and encouragement to be active in her treatment plan. Pt being monitored Q 15 minutes for safety per unit protocol, remains safe on the unit   

## 2023-01-02 NOTE — Group Note (Signed)
Date:  01/02/2023 Time:  11:17 PM  Group Topic/Focus:  Developing a Wellness Toolbox:   The focus of this group is to help patients develop a "wellness toolbox" with skills and strategies to promote recovery upon discharge.    Participation Level:  Active  Participation Quality:  Appropriate  Affect:  Appropriate  Cognitive:  Appropriate  Insight: Appropriate  Engagement in Group:  Engaged  Modes of Intervention:  Discussion  Additional Comments:    Lenore Cordia 01/02/2023, 11:17 PM

## 2023-01-02 NOTE — Group Note (Signed)
Date:  01/02/2023 Time:  11:48 AM  Group Topic/Focus:  Crisis Planning:   The purpose of this group is to help patients create a crisis plan for use upon discharge or in the future, as needed.    Participation Level:  Active  Participation Quality:  Appropriate  Affect:  Appropriate  Cognitive:  Alert, Appropriate, and Oriented  Insight: Appropriate  Engagement in Group:  Developing/Improving and Engaged  Modes of Intervention:  Activity and Discussion  Additional Comments:       01/02/2023, 11:48 AM

## 2023-01-03 ENCOUNTER — Encounter: Payer: Self-pay | Admitting: Psychiatry

## 2023-01-03 MED ORDER — NICOTINE POLACRILEX 2 MG MT GUM
2.0000 mg | CHEWING_GUM | OROMUCOSAL | Status: DC | PRN
  Administered 2023-01-04 (×2): 2 mg via ORAL
  Filled 2023-01-03 (×2): qty 1

## 2023-01-03 MED ORDER — TRAZODONE HCL 100 MG PO TABS
100.0000 mg | ORAL_TABLET | Freq: Every evening | ORAL | Status: DC | PRN
  Administered 2023-01-03: 100 mg via ORAL
  Filled 2023-01-03: qty 1

## 2023-01-03 MED ORDER — GUAIFENESIN 100 MG/5ML PO LIQD
5.0000 mL | ORAL | Status: DC | PRN
  Administered 2023-01-03 – 2023-01-05 (×5): 5 mL via ORAL
  Filled 2023-01-03 (×5): qty 10

## 2023-01-03 NOTE — Plan of Care (Signed)
Pt compliant with medication administration per MD orders and procedures on the unit. Pt scheduled to D/C Sunday  Problem: Education: Goal: Knowledge of General Education information will improve Description: Including pain rating scale, medication(s)/side effects and non-pharmacologic comfort measures Outcome: Progressing   Problem: Health Behavior/Discharge Planning: Goal: Ability to manage health-related needs will improve Outcome: Progressing   Problem: Clinical Measurements: Goal: Ability to maintain clinical measurements within normal limits will improve Outcome: Progressing Goal: Will remain free from infection Outcome: Progressing Goal: Diagnostic test results will improve Outcome: Progressing Goal: Respiratory complications will improve Outcome: Progressing Goal: Cardiovascular complication will be avoided Outcome: Progressing   Problem: Activity: Goal: Risk for activity intolerance will decrease Outcome: Progressing   Problem: Nutrition: Goal: Adequate nutrition will be maintained Outcome: Progressing   Problem: Coping: Goal: Level of anxiety will decrease Outcome: Progressing   Problem: Elimination: Goal: Will not experience complications related to bowel motility Outcome: Progressing Goal: Will not experience complications related to urinary retention Outcome: Progressing   Problem: Pain Managment: Goal: General experience of comfort will improve Outcome: Progressing   Problem: Safety: Goal: Ability to remain free from injury will improve Outcome: Progressing   Problem: Skin Integrity: Goal: Risk for impaired skin integrity will decrease Outcome: Progressing   Problem: Education: Goal: Knowledge of [place] General Education information/materials will improve Outcome: Progressing Goal: Emotional status will improve Outcome: Progressing Goal: Mental status will improve Outcome: Progressing Goal: Verbalization of understanding the information  provided will improve Outcome: Progressing   Problem: Activity: Goal: Interest or engagement in activities will improve Outcome: Progressing Goal: Sleeping patterns will improve Outcome: Progressing   Problem: Coping: Goal: Ability to verbalize frustrations and anger appropriately will improve Outcome: Progressing Goal: Ability to demonstrate self-control will improve Outcome: Progressing   Problem: Health Behavior/Discharge Planning: Goal: Identification of resources available to assist in meeting health care needs will improve Outcome: Progressing Goal: Compliance with treatment plan for underlying cause of condition will improve Outcome: Progressing   Problem: Physical Regulation: Goal: Ability to maintain clinical measurements within normal limits will improve Outcome: Progressing   Problem: Safety: Goal: Periods of time without injury will increase Outcome: Progressing   Problem: Coping: Goal: Coping ability will improve Outcome: Progressing Goal: Will verbalize feelings Outcome: Progressing   Problem: Self-Concept: Goal: Will verbalize positive feelings about self Outcome: Progressing Goal: Level of anxiety will decrease Outcome: Progressing   Problem: Health Behavior/Discharge Planning: Goal: Identification of resources available to assist in meeting health care needs will improve Outcome: Progressing   Problem: Self-Concept: Goal: Ability to disclose and discuss suicidal ideas will improve Outcome: Progressing Goal: Will verbalize positive feelings about self Outcome: Progressing

## 2023-01-03 NOTE — Progress Notes (Signed)
It was reported to this writer that patient's BP was elevated. BP will be re-checked within thirty minutes.   01/03/23 1810  Vital Signs  Temp (!) 97.5 F (36.4 C)  Temp Source Oral  Pulse Rate (!) 109  Pulse Rate Source Monitor  BP (!) 154/100  BP Location Right Arm  BP Method Automatic  Patient Position (if appropriate) Sitting  Oxygen Therapy  SpO2 96 %  O2 Device Room Air

## 2023-01-03 NOTE — Group Note (Signed)
Date:  01/03/2023 Time:  11:26 AM  Group Topic/Focus:  Building Self Esteem:   The Focus of this group is helping patients become aware of the effects of self-esteem on their lives, the things they and others do that enhance or undermine their self-esteem, seeing the relationship between their level of self-esteem and the choices they make and learning ways to enhance self-esteem. Coping With Mental Health Crisis:   The purpose of this group is to help patients identify strategies for coping with mental health crisis.  Group discusses possible causes of crisis and ways to manage them effectively. Goals Group:   The focus of this group is to help patients establish daily goals to achieve during treatment and discuss how the patient can incorporate goal setting into their daily lives to aide in recovery.    Participation Level:  Did Not Attend  Participation Quality:    Affect:    Cognitive:    Insight:   Engagement in Group:    Modes of Intervention:    Additional Comments:    Candis Kabel 01/03/2023, 11:26 AM

## 2023-01-03 NOTE — Progress Notes (Signed)
Patient calm and pleasant during assessment denying SI/HI/AVH. Pt observed interacting appropriately with staff and peers on the unit. Pt compliant with medication administration per MD orders. Pt given education, support, and encouragement to be active in her treatment plan. Pt being monitored Q 15 minutes for safety per unit protocol, remains safe on the unit   

## 2023-01-03 NOTE — BH IP Treatment Plan (Signed)
Interdisciplinary Treatment and Diagnostic Plan Update  01/03/2023 Time of Session: 9:25AM [name] MRN: [id number]  Principal Diagnosis: Major depressive disorder, recurrent episode, severe (HCC)  Secondary Diagnoses: Principal Problem:   Major depressive disorder, recurrent episode, severe (HCC)   Current Medications:  Current Facility-Administered Medications  Medication Dose Route Frequency Provider Last Rate Last Admin   albuterol (VENTOLIN HFA) 108 (90 Base) MCG/ACT inhaler 1-2 puff  1-2 puff Inhalation Q6H PRN Karie Fetch, MD   2 puff at 01/03/23 0856   alum & mag hydroxide-simeth (MAALOX/MYLANTA) 200-200-20 MG/5ML suspension 30 mL  30 mL Oral Q4H PRN Karie Fetch, MD       busPIRone (BUSPAR) tablet 10 mg  10 mg Oral TID Willeen Cass, Christal H, NP   10 mg at 01/03/23 1208   clonazePAM (KLONOPIN) tablet 1 mg  1 mg Oral TID PRN Thurston Hole H, NP   1 mg at 01/03/23 0951   dicyclomine (BENTYL) capsule 10 mg  10 mg Oral TID AC Bennett, Christal H, NP   10 mg at 01/03/23 1208   diphenhydrAMINE (BENADRYL) capsule 50 mg  50 mg Oral TID PRN Karie Fetch, MD   50 mg at 01/02/23 2215   Or   diphenhydrAMINE (BENADRYL) injection 50 mg  50 mg Intramuscular TID PRN Karie Fetch, MD       fluticasone furoate-vilanterol (BREO ELLIPTA) 200-25 MCG/ACT 1 puff  1 puff Inhalation Daily Karie Fetch, MD   1 puff at 01/03/23 0940   haloperidol (HALDOL) tablet 5 mg  5 mg Oral TID PRN Karie Fetch, MD       Or   haloperidol lactate (HALDOL) injection 5 mg  5 mg Intramuscular TID PRN Karie Fetch, MD       hydrOXYzine (ATARAX) tablet 25 mg  25 mg Oral TID PRN Karie Fetch, MD   25 mg at 01/03/23 1438   ibuprofen (ADVIL) tablet 400 mg  400 mg Oral Q6H PRN Willeen Cass, Christal H, NP   400 mg at 01/02/23 2215   loperamide (IMODIUM) capsule 2-4 mg  2-4 mg Oral PRN Karie Fetch, MD       LORazepam (ATIVAN) tablet 1 mg  1 mg Oral Q6H PRN Karie Fetch, MD   1 mg  at 01/02/23 1202   losartan (COZAAR) tablet 50 mg  50 mg Oral Daily Karie Fetch, MD   50 mg at 01/03/23 0847   magnesium hydroxide (MILK OF MAGNESIA) suspension 30 mL  30 mL Oral Daily PRN Karie Fetch, MD       methylphenidate (CONCERTA) CR tablet 27 mg  27 mg Oral q AM Bennett, Christal H, NP   27 mg at 01/03/23 0941   montelukast (SINGULAIR) tablet 10 mg  10 mg Oral QHS Bennett, Christal H, NP   10 mg at 01/02/23 2103   multivitamin with minerals tablet 1 tablet  1 tablet Oral Daily Karie Fetch, MD   1 tablet at 01/03/23 0846   nicotine (NICODERM CQ - dosed in mg/24 hours) patch 21 mg  21 mg Transdermal Daily Lewanda Rife, MD   21 mg at 01/03/23 0941   nicotine polacrilex (NICORETTE) gum 2 mg  2 mg Oral Q4H PRN Bennett, Christal H, NP       ondansetron (ZOFRAN-ODT) disintegrating tablet 4 mg  4 mg Oral Q6H PRN Karie Fetch, MD       pantoprazole (PROTONIX) EC tablet 40 mg  40 mg Oral Daily Karie Fetch, MD   40 mg at 01/03/23 [id number]  pneumococcal 20-valent conjugate vaccine (PREVNAR 20) injection 0.5 mL  0.5 mL Intramuscular Tomorrow-1000 Lewanda Rife, MD       thiamine (VITAMIN B1) tablet 100 mg  100 mg Oral Daily Karie Fetch, MD   100 mg at 01/03/23 0846   tiotropium [facility]) inhalation capsule ([facility] use ONLY) 18 mcg  18 mcg Inhalation Daily Karie Fetch, MD   18 mcg at 01/03/23 0853   traZODone (DESYREL) tablet 50 mg  50 mg Oral QHS PRN Karie Fetch, MD   50 mg at 01/02/23 2104   venlafaxine XR (EFFEXOR-XR) 24 hr capsule 75 mg  75 mg Oral Q breakfast Karie Fetch, MD   75 mg at 01/03/23 0846   PTA Medications: Medications Prior to Admission  Medication Sig Dispense Refill Last Dose   busPIRone (BUSPAR) 10 MG tablet Take 10 mg by mouth 3 (three) times daily.      clonazePAM (KLONOPIN) 1 MG tablet Take 1 mg by mouth 3 (three) times daily as needed for anxiety.      dicyclomine (BENTYL) 10 MG capsule Take 10 mg by mouth 3 (three) times daily  before meals.      famotidine (PEPCID) 40 MG tablet Take 1 tablet (40 mg total) by mouth at bedtime. 30 tablet 1    fluticasone-salmeterol (ADVAIR HFA) 230-21 MCG/ACT inhaler INHALE 2 PUFFS INTO THE LUNGS TWICE A DAY (Patient taking differently: Inhale 2 puffs into the lungs 2 (two) times daily.) 12 each 5    hydrOXYzine (VISTARIL) 25 MG capsule Take 1 capsule (25 mg total) by mouth 3 (three) times daily as needed for anxiety. 30 capsule 0    ibuprofen (ADVIL) 200 MG tablet Take 400 mg by mouth every 6 (six) hours as needed for mild pain or headache.      loratadine (CLARITIN) 10 MG tablet Take 1 tablet (10 mg total) by mouth daily. 30 tablet 2    losartan (COZAAR) 50 MG tablet Take 50 mg by mouth daily.      methylphenidate 27 MG PO CR tablet Take 27 mg by mouth in the morning.      montelukast (SINGULAIR) 10 MG tablet Take 10 mg by mouth at bedtime.      nicotine (NICODERM CQ - DOSED IN MG/24 HOURS) 21 mg/24hr patch Place 1 patch (21 mg total) onto the skin daily.      ondansetron (ZOFRAN-ODT) 4 MG disintegrating tablet Take 1 tablet (4 mg total) by mouth every 8 (eight) hours as needed for nausea or vomiting. (Patient not taking: Reported on 10/16/2022) 20 tablet 0    pantoprazole (PROTONIX) 40 MG tablet TAKE 1 TABLET BY MOUTH EVERY DAY (Patient taking differently: Take 40 mg by mouth daily before breakfast.) 30 tablet 0    solifenacin (VESICARE) 5 MG tablet Take 5 mg by mouth daily.      Tiotropium Bromide Monohydrate (SPIRIVA RESPIMAT) 2.5 MCG/ACT AERS Inhale 2 puffs into the lungs daily. 1 each 5    venlafaxine XR (EFFEXOR-XR) 75 MG 24 hr capsule Take 1 tablet by mouth daily. (Patient not taking: Reported on 12/30/2022)       Patient Stressors: Marital or family conflict   Medication change or noncompliance   Substance abuse    Patient Strengths: Arboriculturist fund of knowledge  Motivation for treatment/growth  Work skills   Treatment Modalities:  Medication Management, Group therapy, Case management,  1 to 1 session with clinician, Psychoeducation, Recreational therapy.   Physician Treatment Plan for Primary  Diagnosis: Major depressive disorder, recurrent episode, severe (HCC) Long Term Goal(s): Improvement in symptoms so as ready for discharge   Short Term Goals: Ability to verbalize feelings will improve Ability to identify and develop effective coping behaviors will improve Ability to maintain clinical measurements within normal limits will improve Compliance with prescribed medications will improve Ability to identify triggers associated with substance abuse/mental health issues will improve Ability to identify changes in lifestyle to reduce recurrence of condition will improve Ability to disclose and discuss suicidal ideas Ability to demonstrate self-control will improve  Medication Management: Evaluate patient's response, side effects, and tolerance of medication regimen.  Therapeutic Interventions: 1 to 1 sessions, Unit Group sessions and Medication administration.  Evaluation of Outcomes: Progressing  Physician Treatment Plan for Secondary Diagnosis: Principal Problem:   Major depressive disorder, recurrent episode, severe (HCC)  Long Term Goal(s): Improvement in symptoms so as ready for discharge   Short Term Goals: Ability to verbalize feelings will improve Ability to identify and develop effective coping behaviors will improve Ability to maintain clinical measurements within normal limits will improve Compliance with prescribed medications will improve Ability to identify triggers associated with substance abuse/mental health issues will improve Ability to identify changes in lifestyle to reduce recurrence of condition will improve Ability to disclose and discuss suicidal ideas Ability to demonstrate self-control will improve     Medication Management: Evaluate patient's response, side effects, and tolerance of  medication regimen.  Therapeutic Interventions: 1 to 1 sessions, Unit Group sessions and Medication administration.  Evaluation of Outcomes: Progressing   RN Treatment Plan for Primary Diagnosis: Major depressive disorder, recurrent episode, severe (HCC) Long Term Goal(s): Knowledge of disease and therapeutic regimen to maintain health will improve  Short Term Goals: Ability to demonstrate self-control, Ability to participate in decision making will improve, Ability to verbalize feelings will improve, Ability to disclose and discuss suicidal ideas, Ability to identify and develop effective coping behaviors will improve, and Compliance with prescribed medications will improve  Medication Management: RN will administer medications as ordered by provider, will assess and evaluate patient's response and provide education to patient for prescribed medication. RN will report any adverse and/or side effects to prescribing provider.  Therapeutic Interventions: 1 on 1 counseling sessions, Psychoeducation, Medication administration, Evaluate responses to treatment, Monitor vital signs and CBGs as ordered, Perform/monitor CIWA, COWS, AIMS and Fall Risk screenings as ordered, Perform wound care treatments as ordered.  Evaluation of Outcomes: Progressing   LCSW Treatment Plan for Primary Diagnosis: Major depressive disorder, recurrent episode, severe (HCC) Long Term Goal(s): Safe transition to appropriate next level of care at discharge, Engage patient in therapeutic group addressing interpersonal concerns.  Short Term Goals: Engage patient in aftercare planning with referrals and resources, Increase social support, Increase ability to appropriately verbalize feelings, Increase emotional regulation, Facilitate acceptance of mental health diagnosis and concerns, and Increase skills for wellness and recovery  Therapeutic Interventions: Assess for all discharge needs, 1 to 1 time with Social worker, Explore  available resources and support systems, Assess for adequacy in community support network, Educate family and significant other(s) on suicide prevention, Complete Psychosocial Assessment, Interpersonal group therapy.  Evaluation of Outcomes: Progressing   Progress in Treatment: Attending groups: Yes. Participating in groups: Yes. Taking medication as prescribed: Yes. Toleration medication: Yes. Family/Significant other contact made: No, will contact:  once permission is given. Patient understands diagnosis: Yes. Discussing patient identified problems/goals with staff: Yes. Medical problems stabilized or resolved: Yes. Denies suicidal/homicidal ideation: Yes. Issues/concerns per patient self-inventory:  No. Other: none  New problem(s) identified: No, Describe:  none  New Short Term/Long Term Goal(s): detox, elimination of symptoms of psychosis, medication management for mood stabilization; elimination of SI thoughts; development of comprehensive mental wellness/sobriety plan.   Patient Goals:  "work through the depression and use my coping skills"  Discharge Plan or Barriers: CSW to assist patient in development of appropriate discharge plans.    Reason for Continuation of Hospitalization: Anxiety Depression Medical Issues Medication stabilization Suicidal ideation  Estimated Length of Stay:  1-7 days  Last 3 [place] Suicide Severity Risk Score: Flowsheet Row Admission (Current) from 01/01/2023 in [facility] INPATIENT BE[facility]AVIORAL MEDICINE Most recent reading at 01/03/2023  6:42 AM ED from 01/01/2023 in [facility] Most recent reading at 01/01/2023  5:41 AM ED from 12/30/2022 in [facility] Emergency Department [facility] Most recent reading at 12/30/2022  9:10 PM  C-SSRS RISK CATEGORY No Risk High Risk High Risk       Last PHQ 2/9 Scores:    01/30/2022   11:28 AM 03/13/2020   10:25 AM 08/16/2019    2:19 PM  Depression screen PHQ 2/9   Decreased Interest 1 3 3   Down, Depressed, Hopeless 1 3 3   PHQ - 2 Score 2 6 6   Altered sleeping 1 3 0  Tired, decreased energy 1 3 0  Change in appetite 0 3 0  Feeling bad or failure about yourself  1 2 0  Trouble concentrating 0 3 0  Moving slowly or fidgety/restless 0 2 0  Suicidal thoughts 1 0 0  PHQ-9 Score 6 22 6   Difficult doing work/chores Somewhat difficult  Very difficult    Scribe for Treatment Team: Gaylan Gerold 01/03/2023 3:43 PM

## 2023-01-03 NOTE — Progress Notes (Signed)
This writer assumed care of this patient at 1400. Patient was found in the dayroom, with staff and other members on the unit. Patient had some complaints of anxiety, in which she did request PRN medication to help with relief. Patient remains safe on the unit.

## 2023-01-03 NOTE — Progress Notes (Signed)
Patient vitals upon re-check.   01/03/23 1855  Vital Signs  Pulse Rate 89  Pulse Rate Source Monitor  BP (!) 127/96  BP Location Right Arm  BP Method Automatic  Patient Position (if appropriate) Sitting  Oxygen Therapy  SpO2 96 %  O2 Device Room [id number]

## 2023-01-03 NOTE — Progress Notes (Signed)
At [phone] pt requested and received prn clonazepam po for anxiety. Pt reported poor results from medication and stated she still feels anxious.

## 2023-01-03 NOTE — Plan of Care (Signed)
D- Patient alert and oriented. Affect/mood. Denies SI, HI, AVH, and pain. Pt states her goal is to go home Pt stated she believes she can be better at home, and wants to be with family. A- Scheduled medications administered to patient, per MD orders. Support and encouragement provided.  Routine safety checks conducted every 15 minutes.  Patient informed to notify staff with problems or concerns. R- No adverse drug reactions noted. Patient contracts for safety at this time. Patient compliant with medications and treatment plan. Patient receptive, calm, and cooperative. Patient interacts well with others on the unit.  Patient remains safe at this time.
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
## 2023-01-03 NOTE — Group Note (Signed)
Recreation Therapy Group Note   Group Topic:Leisure Education  Group Date: 01/03/2023 Start Time: 1000 End Time: 1100 Facilitators: Rosina Lowenstein, LRT, CTRS Location:  Craft Room  Group Description: Leisure. Patients were given the option to choose from coloring, singing karaoke, journaling, or playing cards. LRT and pts discussed the meaning of leisure, the importance of participating in leisure during their free time/when they're outside of the hospital, as well as how our leisure interests can also serve as coping skills. Pt identified two leisure interests and shared with the group.   Goal Area(s) Addressed:  Patient will identify a current leisure interest.  Patient will learn the definition of "leisure". Patient will practice making a positive decision. Patient will have the opportunity to try a new leisure activity. Patient will communicate with peers and LRT.    Affect/Mood: Appropriate   Participation Level: Active and Engaged   Participation Quality: Independent   Behavior: Appropriate, Calm, and Cooperative   Speech/Thought Process: Coherent   Insight: Good   Judgement: Good   Modes of Intervention: Activity   Patient Response to Interventions:  Attentive, Engaged, Interested , and Receptive   Education Outcome:  Acknowledges education   Clinical Observations/Individualized Feedback: [name] was active in their participation of session activities and group discussion. Pt identified "walk and go swimming" as things she does in her free time. Pt chose to color with oil pastels and sing karaoke. Pt was very pleasant when interacting and had a bright affect.    Plan: Continue to engage patient in RT group sessions 2-3x/week.   Rosina Lowenstein, LRT, CTRS 01/03/2023 11:59 AM
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
## 2023-01-03 NOTE — Progress Notes (Signed)
This Clinical research associate noticed 1200 CIWA was overdue. This Clinical research associate asked patient if she was experiencing any withdrawal symptoms, and she said that she wasn't.   01/03/23 1200  CIWA-Ar  Nausea and Vomiting 0  Tactile Disturbances 0  Tremor 0  Auditory Disturbances 0  Paroxysmal Sweats 0  Visual Disturbances 0  Anxiety 0  Headache, Fullness in Head 0  Agitation 0  Orientation and Clouding of Sensorium 0  CIWA-Ar Total 0

## 2023-01-03 NOTE — Progress Notes (Signed)
Patient continues to complain of a productive cough, Dr. Marval Regal was notified earlier in the shift. Patient requesting cough medication. Dr. Marval Regal notified again.

## 2023-01-03 NOTE — Progress Notes (Signed)
[facility] MD Progress Note  01/03/2023  [name]  MRN:  [id number]  Subjective: Patient seen today during rounds, case discussed with staff, chart reviewed.  Patient reports she is doing better today.  Patient said she just had separation anxiety when her son left for [place].  Patient was provided with support and reassurance.  She reports her sleep has improved.  She has been attending group and working on coping strategies.  Principal Problem: Major depressive disorder, recurrent episode, severe (HCC) Diagnosis: Principal Problem:   Major depressive disorder, recurrent episode, severe (HCC)   Past Psychiatric History: Major depressive disorder, [facility] 07/2017 suicide attempt in an overdose, Anxiety    Past Medical History:  Past Medical History:  Diagnosis Date   Anxiety    Asthma    Depression    doing good, not on meds now   GERD (gastroesophageal reflux disease)    Headache    migraines   History of kidney stones    Hypertension    Kidney stones    Missed abortion 10/02/2013   Ovarian cyst    Pregnancy induced hypertension    Suicidal intent    Vaginal Pap smear, abnormal    bx, f/u ok    Past Surgical History:  Procedure Laterality Date   CESAREAN SECTION     CESAREAN SECTION N/A 06/06/2015   Procedure: CESAREAN SECTION;  Surgeon: Kathreen Cosier, MD;  Location: [facility] ORS;  Service: Obstetrics;  Laterality: N/A;   CHOLECYSTECTOMY N/A 08/23/2016   Procedure: LAPAROSCOPIC CHOLECYSTECTOMY;  Surgeon: Berna Bue, MD;  Location: [facility] OR;  Service: General;  Laterality: N/A;   DILATION AND CURETTAGE OF UTERUS     DILATION AND EVACUATION N/A 10/04/2013   Procedure: DILATATION AND EVACUATION;  Surgeon: Sherian Rein, MD;  Location: [facility]S;  Service: Gynecology;  Laterality: N/A;  [place] in room    TONSILLECTOMY     TUBAL LIGATION     Family History:  Family History  Problem Relation Age of Onset   Hearing loss Mother    Asthma Mother    Asthma Father    Diabetes  Son    Cancer Maternal Grandfather        bone   Liver disease Maternal Grandfather    Asthma Sister    Lupus Sister     Social History:  Social History   Substance and Sexual Activity  Alcohol Use Yes   Alcohol/week: 4.0 standard drinks of alcohol   Types: 4 Cans of beer per week     Social History   Substance and Sexual Activity  Drug Use No    Social History   Socioeconomic History   Marital status: Single    Spouse name: Not on file   Number of children: Not on file   Years of education: Not on file   Highest education level: Not on file  Occupational History   Not on file  Tobacco Use   Smoking status: Every Day    Current packs/day: 0.25    Average packs/day: 0.3 packs/day for 15.0 years (3.8 ttl pk-yrs)    Types: Cigarettes   Smokeless tobacco: Never   Tobacco comments:    Smoking 2-3 cigs a day. 03/07/2022 Tay  Vaping Use   Vaping status: Never Used  Substance and Sexual Activity   Alcohol use: Yes    Alcohol/week: 4.0 standard drinks of alcohol    Types: 4 Cans of beer per week   Drug use: No   Sexual activity:  Yes    Partners: Male    Birth control/protection: Surgical  Other Topics Concern   Not on file  Social History Narrative   Not on file   Social Determinants of Health   Financial Resource Strain: Medium Risk (10/01/2022)   Received from [facility], [facility]   Overall Financial Resource Strain (CARDIA)    Difficulty of Paying Living Expenses: Somewhat hard  Food Insecurity: Food Insecurity Present (01/01/2023)   Hunger Vital Sign    Worried About Running Out of Food in the Last Year: Sometimes true    Ran Out of Food in the Last Year: Sometimes true  Transportation Needs: No Transportation Needs (01/01/2023)   PRAPARE - Administrator, Civil Service (Medical): No    Lack of Transportation (Non-Medical): No  Physical Activity: Insufficiently Active (10/01/2022)   Received from [facility], [facility]   Exercise  Vital Sign    Days of Exercise per Week: 2 days    Minutes of Exercise per Session: 30 min  Stress: Stress Concern Present (10/01/2022)   Received from [facility], [facility] Occupational [facility] - Occupational Stress Questionnaire    Feeling of Stress : To some extent  Social Connections: Socially Integrated (10/01/2022)   Received from [facility], [facility]   Social Network    How would you rate your social network (family, work, friends)?: Good participation with social networks   Additional Social History:                         Sleep: Fair  Appetite:  Fair  Current Medications: Current Facility-Administered Medications  Medication Dose Route Frequency Provider Last Rate Last Admin   albuterol (VENTOLIN HFA) 108 (90 Base) MCG/ACT inhaler 1-2 puff  1-2 puff Inhalation Q6H PRN Karie Fetch, MD   2 puff at 01/03/23 0856   alum & mag hydroxide-simeth (MAALOX/MYLANTA) 200-200-20 MG/5ML suspension 30 mL  30 mL Oral Q4H PRN Karie Fetch, MD       busPIRone (BUSPAR) tablet 10 mg  10 mg Oral TID Willeen Cass, Christal H, NP   10 mg at 01/03/23 1208   clonazePAM (KLONOPIN) tablet 1 mg  1 mg Oral TID PRN Willeen Cass, Christal H, NP   1 mg at 01/03/23 0951   dicyclomine (BENTYL) capsule 10 mg  10 mg Oral TID AC Bennett, Christal H, NP   10 mg at 01/03/23 1208   diphenhydrAMINE (BENADRYL) capsule 50 mg  50 mg Oral TID PRN Karie Fetch, MD   50 mg at 01/02/23 2215   Or   diphenhydrAMINE (BENADRYL) injection 50 mg  50 mg Intramuscular TID PRN Karie Fetch, MD       fluticasone furoate-vilanterol (BREO ELLIPTA) 200-25 MCG/ACT 1 puff  1 puff Inhalation Daily Karie Fetch, MD   1 puff at 01/03/23 0940   haloperidol (HALDOL) tablet 5 mg  5 mg Oral TID PRN Karie Fetch, MD       Or   haloperidol lactate (HALDOL) injection 5 mg  5 mg Intramuscular TID PRN Karie Fetch, MD       hydrOXYzine (ATARAX) tablet 25 mg  25 mg Oral TID PRN Karie Fetch, MD   25 mg at 01/03/23 1438   ibuprofen (ADVIL) tablet 400 mg  400 mg Oral Q6H PRN Willeen Cass, Christal H, NP   400 mg at 01/02/23 2215   loperamide (IMODIUM) capsule 2-4 mg  2-4 mg Oral PRN Karie Fetch,  MD       LORazepam (ATIVAN) tablet 1 mg  1 mg Oral Q6H PRN Karie Fetch, MD   1 mg at 01/02/23 1202   losartan (COZAAR) tablet 50 mg  50 mg Oral Daily Karie Fetch, MD   50 mg at 01/03/23 0847   magnesium hydroxide (MILK OF MAGNESIA) suspension 30 mL  30 mL Oral Daily PRN Karie Fetch, MD       methylphenidate (CONCERTA) CR tablet 27 mg  27 mg Oral q AM Bennett, Christal H, NP   27 mg at 01/03/23 0941   montelukast (SINGULAIR) tablet 10 mg  10 mg Oral QHS Bennett, Christal H, NP   10 mg at 01/02/23 2103   multivitamin with minerals tablet 1 tablet  1 tablet Oral Daily Karie Fetch, MD   1 tablet at 01/03/23 0846   nicotine (NICODERM CQ - dosed in mg/24 hours) patch 21 mg  21 mg Transdermal Daily Lewanda Rife, MD   21 mg at 01/03/23 0941   nicotine polacrilex (NICORETTE) gum 2 mg  2 mg Oral Q4H PRN Bennett, Christal H, NP       ondansetron (ZOFRAN-ODT) disintegrating tablet 4 mg  4 mg Oral Q6H PRN Karie Fetch, MD       pantoprazole (PROTONIX) EC tablet 40 mg  40 mg Oral Daily Karie Fetch, MD   40 mg at 01/03/23 0846   pneumococcal 20-valent conjugate vaccine (PREVNAR 20) injection 0.5 mL  0.5 mL Intramuscular Tomorrow-1000 Lewanda Rife, MD       thiamine (VITAMIN B1) tablet 100 mg  100 mg Oral Daily Karie Fetch, MD   100 mg at 01/03/23 0846   tiotropium (SPIRIVA) inhalation capsule ([facility] use ONLY) 18 mcg  18 mcg Inhalation Daily Karie Fetch, MD   18 mcg at 01/03/23 0853   traZODone (DESYREL) tablet 50 mg  50 mg Oral QHS PRN Karie Fetch, MD   50 mg at 01/02/23 2104   venlafaxine XR (EFFEXOR-XR) 24 hr capsule 75 mg  75 mg Oral Q breakfast Karie Fetch, MD   75 mg at 01/03/23 4696    Lab Results: No results found for this or any  previous visit (from the past 48 hour(s)).  Blood Alcohol level:  Lab Results  Component Value Date   ETH <10 12/30/2022   ETH <10 06/18/2022    Metabolic Disorder Labs: Lab Results  Component Value Date   HGBA1C 4.6 (L) 01/30/2022   MPG 85.32 01/30/2022   MPG 103 07/29/2014   No results found for: "PROLACTIN" Lab Results  Component Value Date   CHOL 232 (H) 01/30/2022   TRIG 157 (H) 01/30/2022   HDL 114 01/30/2022   CHOLHDL 2.0 01/30/2022   VLDL 31 01/30/2022   LDLCALC 87 01/30/2022    Musculoskeletal: Strength & Muscle Tone: within normal limits Gait & Station: normal Patient leans: N/A                       Psychiatric Specialty Exam:   Presentation  General Appearance:  Casual   Eye Contact: Good   Speech: Clear and Coherent   Speech Volume: Normal   Handedness: Right     Mood and Affect  Mood: "Better"   Affect: Congruent     Thought Process  Thought Processes: Goal Directed   Duration of Psychotic Symptoms: > 6 months Past Diagnosis of Schizophrenia or Psychoactive disorder: No   Descriptions of Associations:Intact   Orientation:Full (Time, Place and Person)  Thought Content:WDL   Hallucinations:Denies Ideas of Reference:None   Suicidal Thoughts:Denies today Homicidal Thoughts: Denies   Sensorium  Memory: Immediate Fair   Judgment: Fair   Insight: Good     Executive Functions  Concentration: Good   Attention Span: Good   Recall: Good   Fund of Knowledge: Good   Language: Good     Psychomotor Activity  Psychomotor Activity:Normal   Assets  Assets: Desire for Improvement; Resilience     Sleep  Sleep:Fair     Physical Exam: Physical Exam Vitals and nursing note reviewed.  Constitutional:      General: She is not in acute distress. HENT:     Head: Normocephalic.     Nose: Nose normal.  Pulmonary:     Effort: Pulmonary effort is normal. No respiratory distress.  Musculoskeletal:         General: Normal range of motion.     Cervical back: Normal range of motion.  Neurological:     Mental Status: She is alert and oriented to person, place, and time.      Review of Systems  Respiratory:  Negative for shortness of breath.   Cardiovascular:  Negative for chest pain.  Psychiatric/Behavioral: The patient is nervous/anxious and has insomnia.   All other systems reviewed and are negative.  Blood pressure (!) 141/85, pulse 86, temperature 97.6 F (36.4 C), resp. rate 18, height 5\' 4"  (1.626 m), weight 86.2 kg, last menstrual period 12/16/2022, SpO2 94%. Body mass index is 32.61 kg/m.   Treatment Plan Summary: Daily contact with patient to assess and evaluate symptoms and progress in treatment and Medication management   Major depressive disorder, recurrent episode, severe (HCC)    Anxiety and Panic Attacks: -Restart buspirone 10 mg TID -Restart clonazepam 1 mg TID PRN for anxiety -Restart hydroxyzine 25 mg TID PRN for anxiety - Encourage participation in group therapy and coping strategies - Monitor frequency and severity of panic attacks     Depression: - Restarted Effexor 75 mg daily - Monitor mood and response to medication - Encourage participation in group therapy and coping strategies - Assess for any signs of bipolar disorder      Insomnia: - Increase the dose of Trazodone 100 mg at bedtime PRN for sleep - Monitor sleep quality and duration - Encourage sleep hygiene practices      Medication Management: - Informed patient that Vesicare is not available at [facility] and can be ordered as a non-formulary medication - Encouraged patient to have someone bring in [place].  Lewanda Rife, MD

## 2023-01-03 NOTE — Group Note (Signed)
[facility] LCSW Group Therapy Note   Group Date: 01/02/2023 Start Time: 1315 End Time: 1420   Type of Therapy/Topic:  Group Therapy:  Emotion Regulation  Participation Level:  Did Not Attend    Description of Group:    The purpose of this group is to assist patients in learning to regulate negative emotions and experience positive emotions. Patients will be guided to discuss ways in which they have been vulnerable to their negative emotions. These vulnerabilities will be juxtaposed with experiences of positive emotions or situations, and patients challenged to use positive emotions to combat negative ones. Special emphasis will be placed on coping with negative emotions in conflict situations, and patients will process healthy conflict resolution skills.  Therapeutic Goals: Patient will identify two positive emotions or experiences to reflect on in order to balance out negative emotions:  Patient will label two or more emotions that they find the most difficult to experience:  Patient will be able to demonstrate positive conflict resolution skills through discussion or role plays:   Summary of Patient Progress: X    Therapeutic Modalities:   Cognitive Behavioral Therapy Feelings Identification Dialectical Behavioral Therapy   Glenis Smoker, LCSW

## 2023-01-04 DIAGNOSIS — F332 Major depressive disorder, recurrent severe without psychotic features: Secondary | ICD-10-CM

## 2023-01-04 MED ORDER — DM-GUAIFENESIN ER 30-600 MG PO TB12
1.0000 | ORAL_TABLET | Freq: Two times a day (BID) | ORAL | Status: DC
  Administered 2023-01-04 – 2023-01-06 (×4): 1 via ORAL
  Filled 2023-01-04 (×5): qty 1

## 2023-01-04 MED ORDER — TRAZODONE HCL 100 MG PO TABS
200.0000 mg | ORAL_TABLET | Freq: Every evening | ORAL | Status: DC | PRN
  Administered 2023-01-04 – 2023-01-05 (×2): 200 mg via ORAL
  Filled 2023-01-04 (×2): qty 2

## 2023-01-04 NOTE — Group Note (Signed)
[facility] LCSW Group Therapy Note   Group Date: 01/04/2023 Start Time: 1310 End Time: 1410   Type of Therapy/Topic:  Group Therapy:  Balance in Life  Participation Level:  Active   Description of Group:    This group will address the concept of balance and how it feels and looks when one is unbalanced. Patients will be encouraged to process areas in their lives that are out of balance, and identify reasons for remaining unbalanced. Facilitators will guide patients utilizing problem- solving interventions to address and correct the stressor making their life unbalanced. Understanding and applying boundaries will be explored and addressed for obtaining  and maintaining a balanced life. Patients will be encouraged to explore ways to assertively make their unbalanced needs known to significant others in their lives, using other group members and facilitator for support and feedback.  Therapeutic Goals: Patient will identify two or more emotions or situations they have that consume much of in their lives. Patient will identify signs/triggers that life has become out of balance:  Patient will identify two ways to set boundaries in order to achieve balance in their lives:  Patient will demonstrate ability to communicate their needs through discussion and/or role plays  Summary of Patient Progress:    The patient attended group. The patient participated during the icebreaker. The patient stated that she is not that organized. The patient stated that she is too focussed on others that she don't always take care of her self. The patient stated that she likes to journal.    Marshell Levan, LCSW

## 2023-01-04 NOTE — Progress Notes (Signed)
Patient received Prevnar 20 injection to the left deltoid. Patient tolerated injection well, without any issues. Patient received patient information on vaccine. Questions and concerns addressed and answered. Patient remains safe on the unit.

## 2023-01-04 NOTE — Progress Notes (Addendum)
[facility] MD Progress Note  01/04/2023 10:41 AM [name]  MRN:  [id number]  Subjective:  Nursing notes, labs, and vital signs reviewed prior to assessment.  She denies depression and anxiety.  "I just had a moment.  I got upset because my 40 yo went back to [place]" as he lives with his father.  Her depression improved here with talking to other clients and participating is groups.  Appetite is "pretty good".  Sleep is poor even with Klonopin and Trazodone, "I haven't slept all week".  Focused on discharging and reported her 40 yo daughter starting school on Monday and needs to be home. This will be discussed with the psychiatrist.    Collateral from her father, [name], and stepmother after she asked [place] to speak to her.  They reported she needs to stay here because she will not stop drinking and stopped her medications.  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  Principal Problem: MDD (major depressive disorder), recurrent severe, without psychosis (HCC) Diagnosis: Principal Problem:   MDD (major depressive disorder), recurrent severe, without psychosis (HCC)  Total Time spent with patient: 30 minutes  Past Psychiatric History: depression, anxiety  Past Medical History:  Past Medical History:  Diagnosis Date   Anxiety    Asthma    Depression    doing good, not on meds now   GERD (gastroesophageal reflux disease)    Headache    migraines   History of kidney stones    Hypertension    Kidney stones    Missed abortion 10/02/2013   Ovarian cyst    Pregnancy induced hypertension    Suicidal intent    Vaginal Pap smear, abnormal    bx, f/u ok    Past Surgical History:  Procedure Laterality Date   CESAREAN SECTION     CESAREAN SECTION N/A 06/06/2015   Procedure: CESAREAN SECTION;  Surgeon: Kathreen Cosier, MD;  Location: [facility];  Service: Obstetrics;  Laterality: N/A;   CHOLECYSTECTOMY N/A 08/23/2016   Procedure:  LAPAROSCOPIC CHOLECYSTECTOMY;  Surgeon: Berna Bue, MD;  Location: [facility];  Service: General;  Laterality: N/A;   DILATION AND CURETTAGE OF UTERUS     DILATION AND EVACUATION N/A 10/04/2013   Procedure: DILATATION AND EVACUATION;  Surgeon: Sherian Rein, MD;  Location: [facility];  Service: Gynecology;  Laterality: N/A;  [place] in room    TONSILLECTOMY     TUBAL LIGATION     Family History:  Family History  Problem Relation Age of Onset   Hearing loss Mother    Asthma Mother    Asthma Father    Diabetes Son    Cancer Maternal Grandfather        bone   Liver disease Maternal Grandfather    Asthma Sister    Lupus Sister    Family Psychiatric  History: see above Social History:  Social History   Substance and Sexual Activity  Alcohol Use Yes   Alcohol/week: 4.0 standard drinks of alcohol   Types: 4 Cans of beer per week     Social History   Substance and Sexual Activity  Drug Use No    Social History   Socioeconomic History   Marital status: Single    Spouse name: Not on file   Number of children: Not on file   Years of education: Not on file   Highest education level: Not on file  Occupational History  Not on file  Tobacco Use   Smoking status: Every Day    Current packs/day: 0.25    Average packs/day: 0.3 packs/day for 15.0 years (3.8 ttl pk-yrs)    Types: Cigarettes   Smokeless tobacco: Never   Tobacco comments:    Smoking 2-3 cigs a day. 03/07/2022 Tay  Vaping Use   Vaping status: Never Used  Substance and Sexual Activity   Alcohol use: Yes    Alcohol/week: 4.0 standard drinks of alcohol    Types: 4 Cans of beer per week   Drug use: No   Sexual activity: Yes    Partners: Male    Birth control/protection: Surgical  Other Topics Concern   Not on file  Social History Narrative   Not on file   Social Determinants of Health   Financial Resource Strain: Medium Risk (10/01/2022)   Received from [facility], [facility]   Overall Financial  Resource Strain (CARDIA)    Difficulty of Paying Living Expenses: Somewhat hard  Food Insecurity: Food Insecurity Present (01/01/2023)   Hunger Vital Sign    Worried About Running Out of Food in the Last Year: Sometimes true    Ran Out of Food in the Last Year: Sometimes true  Transportation Needs: No Transportation Needs (01/01/2023)   PRAPARE - Administrator, Civil Service (Medical): No    Lack of Transportation (Non-Medical): No  Physical Activity: Insufficiently Active (10/01/2022)   Received from [facility]   Exercise Vital Sign    Days of Exercise per Week: 2 days    Minutes of Exercise per Session: 30 min  Stress: Stress Concern Present (10/01/2022)   Received from [facility], [facility] - Occupational Stress Questionnaire    Feeling of Stress : To some extent  Social Connections: Socially Integrated (10/01/2022)   Received from [facility], [facility]   Social Network    How would you rate your social network (family, work, friends)?: Good participation with social networks   Additional Social History: lives with her 2 yo daughter    Sleep: Poor  Appetite:  Good  Current Medications: Current Facility-Administered Medications  Medication Dose Route Frequency Provider Last Rate Last Admin   albuterol (VENTOLIN HFA) 108 (90 Base) MCG/ACT inhaler 1-2 puff  1-2 puff Inhalation Q6H PRN Karie Fetch, MD   2 puff at 01/04/23 0845   alum & mag hydroxide-simeth (MAALOX/MYLANTA) 200-200-20 MG/5ML suspension 30 mL  30 mL Oral Q4H PRN Karie Fetch, MD       busPIRone (BUSPAR) tablet 10 mg  10 mg Oral TID Willeen Cass, Christal H, NP   10 mg at 01/04/23 0838   clonazePAM (KLONOPIN) tablet 1 mg  1 mg Oral TID PRN Willeen Cass, Christal H, NP   1 mg at 01/03/23 1737   dicyclomine (BENTYL) capsule 10 mg  10 mg Oral TID AC Bennett, Christal H, NP   10 mg at 01/04/23 0838   diphenhydrAMINE (BENADRYL) capsule 50  mg  50 mg Oral TID PRN Karie Fetch, MD   50 mg at 01/02/23 2215   Or   diphenhydrAMINE (BENADRYL) injection 50 mg  50 mg Intramuscular TID PRN Karie Fetch, MD       fluticasone furoate-vilanterol (BREO ELLIPTA) 200-25 MCG/ACT 1 puff  1 puff Inhalation Daily Karie Fetch, MD   1 puff at 01/04/23 0839   guaiFENesin (ROBITUSSIN) 100 MG/5ML liquid 5 mL  5 mL Oral Q4H PRN Lewanda Rife, MD  5 mL at 01/04/23 8295   haloperidol (HALDOL) tablet 5 mg  5 mg Oral TID PRN Karie Fetch, MD       Or   haloperidol lactate (HALDOL) injection 5 mg  5 mg Intramuscular TID PRN Karie Fetch, MD       hydrOXYzine (ATARAX) tablet 25 mg  25 mg Oral TID PRN Karie Fetch, MD   25 mg at 01/03/23 1438   ibuprofen (ADVIL) tablet 400 mg  400 mg Oral Q6H PRN Willeen Cass, Christal H, NP   400 mg at 01/03/23 2000   loperamide (IMODIUM) capsule 2-4 mg  2-4 mg Oral PRN Karie Fetch, MD       LORazepam (ATIVAN) tablet 1 mg  1 mg Oral Q6H PRN Karie Fetch, MD   1 mg at 01/03/23 2312   losartan (COZAAR) tablet 50 mg  50 mg Oral Daily Karie Fetch, MD   50 mg at 01/04/23 6213   magnesium hydroxide (MILK OF MAGNESIA) suspension 30 mL  30 mL Oral Daily PRN Karie Fetch, MD       methylphenidate (CONCERTA) CR tablet 27 mg  27 mg Oral q AM Bennett, Christal H, NP   27 mg at 01/04/23 0635   montelukast (SINGULAIR) tablet 10 mg  10 mg Oral QHS Bennett, Christal H, NP   10 mg at 01/03/23 2112   multivitamin with minerals tablet 1 tablet  1 tablet Oral Daily Karie Fetch, MD   1 tablet at 01/04/23 0865   nicotine (NICODERM CQ - dosed in mg/24 hours) patch 21 mg  21 mg Transdermal Daily Lewanda Rife, MD   21 mg at 01/03/23 0941   nicotine polacrilex (NICORETTE) gum 2 mg  2 mg Oral Q4H PRN Willeen Cass, Christal H, NP   2 mg at 01/04/23 0838   ondansetron (ZOFRAN-ODT) disintegrating tablet 4 mg  4 mg Oral Q6H PRN Karie Fetch, MD       pantoprazole (PROTONIX) EC tablet 40 mg  40 mg Oral Daily  Karie Fetch, MD   40 mg at 01/04/23 7846   pneumococcal 20-valent conjugate vaccine (PREVNAR 20) injection 0.5 mL  0.5 mL Intramuscular Tomorrow-1000 Lewanda Rife, MD       thiamine (VITAMIN B1) tablet 100 mg  100 mg Oral Daily Karie Fetch, MD   100 mg at 01/04/23 0838   tiotropium (SPIRIVA) inhalation capsule ([facility] use ONLY) 18 mcg  18 mcg Inhalation Daily Karie Fetch, MD   18 mcg at 01/04/23 0838   traZODone (DESYREL) tablet 100 mg  100 mg Oral QHS PRN Lewanda Rife, MD   100 mg at 01/03/23 2112   venlafaxine XR (EFFEXOR-XR) 24 hr capsule 75 mg  75 mg Oral Q breakfast Karie Fetch, MD   75 mg at 01/04/23 9629    Lab Results: No results found for this or any previous visit (from the past 48 hour(s)).  Blood Alcohol level:  Lab Results  Component Value Date   ETH <10 12/30/2022   ETH <10 06/18/2022    Metabolic Disorder Labs: Lab Results  Component Value Date   HGBA1C 4.6 (L) 01/30/2022   MPG 85.32 01/30/2022   MPG 103 07/29/2014   No results found for: "PROLACTIN" Lab Results  Component Value Date   CHOL 232 (H) 01/30/2022   TRIG 157 (H) 01/30/2022   HDL 114 01/30/2022   CHOLHDL 2.0 01/30/2022   VLDL 31 01/30/2022   LDLCALC 87 01/30/2022    Physical Findings: AIMS:  , ,  ,  ,  CIWA:  CIWA-Ar Total: 0 COWS:     Musculoskeletal: Strength & Muscle Tone: within normal limits Gait & Station: normal Patient leans: N/A  Psychiatric Specialty Exam: Physical Exam Vitals and nursing note reviewed.  Constitutional:      Appearance: Normal appearance.  HENT:     Head: Normocephalic.     Nose: Nose normal.  Pulmonary:     Effort: Pulmonary effort is normal.  Musculoskeletal:        General: Normal range of motion.     Cervical back: Normal range of motion.  Neurological:     General: No focal deficit present.     Mental Status: She is alert and oriented to person, place, and time.  Psychiatric:        Attention and Perception: Attention  and perception normal.        Mood and Affect: Mood and affect normal.        Speech: Speech normal.        Behavior: Behavior normal. Behavior is cooperative.        Thought Content: Thought content normal.        Cognition and Memory: Cognition and memory normal.        Judgment: Judgment normal.     Review of Systems  Respiratory:  Positive for cough.   Psychiatric/Behavioral:  The patient has insomnia.   All other systems reviewed and are negative.   Blood pressure 109/76, pulse 82, temperature (!) 97.2 F (36.2 C), resp. rate 20, height 5\' 4"  (1.626 m), weight 86.2 kg, last menstrual period 12/16/2022, SpO2 97%.Body mass index is 32.61 kg/m.  General Appearance: Casual  Eye Contact:  Good  Speech:  Normal Rate  Volume:  Normal  Mood:  Anxious  Affect:  Congruent  Thought Process:  Coherent  Orientation:  Full (Time, Place, and Person)  Thought Content:  Logical  Suicidal Thoughts:  No  Homicidal Thoughts:  No  Memory:  Immediate;   Fair Recent;   Fair Remote;   Fair  Judgement:  Fair  Insight:  Fair  Psychomotor Activity:  Increased  Concentration:  Concentration: Fair and Attention Span: Fair  Recall:  [employer] of Knowledge:  Fair  Language:  Good  Akathisia:  No  Handed:  Right  AIMS (if indicated):     Assets:  Housing Leisure Time Physical Health Resilience Social Support  ADL's:  Intact  Cognition:  WNL  Sleep:         Physical Exam: Physical Exam Vitals and nursing note reviewed.  Constitutional:      Appearance: Normal appearance.  HENT:     Head: Normocephalic.     Nose: Nose normal.  Pulmonary:     Effort: Pulmonary effort is normal.  Musculoskeletal:        General: Normal range of motion.     Cervical back: Normal range of motion.  Neurological:     General: No focal deficit present.     Mental Status: She is alert and oriented to person, place, and time.  Psychiatric:        Attention and Perception: Attention and perception  normal.        Mood and Affect: Mood and affect normal.        Speech: Speech normal.        Behavior: Behavior normal. Behavior is cooperative.        Thought Content: Thought content normal.        Cognition and Memory: Cognition  and memory normal.        Judgment: Judgment normal.    Review of Systems  Respiratory:  Positive for cough.   Psychiatric/Behavioral:  The patient has insomnia.   All other systems reviewed and are negative.  Blood pressure 109/76, pulse 82, temperature (!) 97.2 F (36.2 C), resp. rate 20, height 5\' 4"  (1.626 m), weight 86.2 kg, last menstrual period 12/16/2022, SpO2 97%. Body mass index is 32.61 kg/m.   Treatment Plan Summary: Daily contact with patient to assess and evaluate symptoms and progress in treatment, Medication management, and Plan : Major depressive disorder, recurrent, severe without psychosis: Effexor XR 75 mg daily  Anxiety: Buspar 10 mg TID Klonopin 1 mg TID PRN Hydroxyzine 25 mg TID PRN  Insomnia: Trazodone 100 mg at bedtime increased to 200 mg   ADHD: Concerta 27 mg daily   Nanine Means, NP 01/04/2023, 10:41 AM

## 2023-01-04 NOTE — Progress Notes (Signed)
Patient came up and is requesting to D/C tomorrow (8/17) instead of on Sunday 8/18. Pt given education and this writer will pass that information onto the day shift and to the doctor.

## 2023-01-04 NOTE — Plan of Care (Signed)
D- Patient alert and oriented. Patient presented in a preoccupied, but pleasant mood on assessment reporting that she slept "poor" last night stating that the medication she received didn't help. Patient is anxious/worried about going home prior to Monday. Patient had complaints of coughing and lower back pain. Patient rated her pain a "5/10", in which she requested PRN medication for relief. Patient denied SI, HI, AVH at this time. Patient's goal for today is "finding a ride home, my car is at [facility] in [place]", in which she will "make calls", in order to achieve her goal.  A- Scheduled medications administered to patient, per MD orders. Support and encouragement provided. Routine safety checks conducted every 15 minutes. Patient informed to notify staff with problems or concerns.  R- No adverse drug reactions noted. Patient contracts for safety at this time. Patient compliant with medications and treatment plan. Patient receptive, calm, and cooperative. Patient interacts well with others on the unit. Patient remains safe at this time.  Problem: Education: Goal: Knowledge of General Education information will improve Description: Including pain rating scale, medication(s)/side effects and non-pharmacologic comfort measures Outcome: Progressing   Problem: Health Behavior/Discharge Planning: Goal: Ability to manage health-related needs will improve Outcome: Progressing   Problem: Clinical Measurements: Goal: Ability to maintain clinical measurements within normal limits will improve Outcome: Progressing Goal: Will remain free from infection Outcome: Progressing Goal: Diagnostic test results will improve Outcome: Progressing Goal: Respiratory complications will improve Outcome: Progressing Goal: Cardiovascular complication will be avoided Outcome: Progressing   Problem: Activity: Goal: Risk for activity intolerance will decrease Outcome: Progressing   Problem:  Nutrition: Goal: Adequate nutrition will be maintained Outcome: Progressing   Problem: Coping: Goal: Level of anxiety will decrease Outcome: Progressing   Problem: Elimination: Goal: Will not experience complications related to bowel motility Outcome: Progressing Goal: Will not experience complications related to urinary retention Outcome: Progressing   Problem: Pain Managment: Goal: General experience of comfort will improve Outcome: Progressing   Problem: Safety: Goal: Ability to remain free from injury will improve Outcome: Progressing   Problem: Skin Integrity: Goal: Risk for impaired skin integrity will decrease Outcome: Progressing   Problem: Education: Goal: Knowledge of [place]  General Education information/materials will improve Outcome: Progressing Goal: Emotional status will improve Outcome: Progressing Goal: Mental status will improve Outcome: Progressing Goal: Verbalization of understanding the information provided will improve Outcome: Progressing   Problem: Activity: Goal: Interest or engagement in activities will improve Outcome: Progressing Goal: Sleeping patterns will improve Outcome: Progressing   Problem: Coping: Goal: Ability to verbalize frustrations and anger appropriately will improve Outcome: Progressing Goal: Ability to demonstrate self-control will improve Outcome: Progressing   Problem: Health Behavior/Discharge Planning: Goal: Identification of resources available to assist in meeting health care needs will improve Outcome: Progressing Goal: Compliance with treatment plan for underlying cause of condition will improve Outcome: Progressing   Problem: Physical Regulation: Goal: Ability to maintain clinical measurements within normal limits will improve Outcome: Progressing   Problem: Safety: Goal: Periods of time without injury will increase Outcome: Progressing   Problem: Coping: Goal: Coping ability will improve Outcome:  Progressing Goal: Will verbalize feelings Outcome: Progressing   Problem: Self-Concept: Goal: Will verbalize positive feelings about self Outcome: Progressing Goal: Level of anxiety will decrease Outcome: Progressing   Problem: Health Behavior/Discharge Planning: Goal: Identification of resources available to assist in meeting health care needs will improve Outcome: Progressing   Problem: Self-Concept: Goal: Ability to disclose and discuss suicidal ideas will improve Outcome: Progressing Goal:  Will verbalize positive feelings about self Outcome: Progressing

## 2023-01-05 DIAGNOSIS — F332 Major depressive disorder, recurrent severe without psychotic features: Secondary | ICD-10-CM | POA: Diagnosis not present

## 2023-01-05 MED ORDER — NALTREXONE HCL 50 MG PO TABS
50.0000 mg | ORAL_TABLET | Freq: Every day | ORAL | Status: DC
  Administered 2023-01-05 – 2023-01-06 (×2): 50 mg via ORAL
  Filled 2023-01-05 (×2): qty 1

## 2023-01-05 MED ORDER — CLONIDINE HCL 0.1 MG PO TABS
0.1000 mg | ORAL_TABLET | Freq: Once | ORAL | Status: AC
  Administered 2023-01-05: 0.1 mg via ORAL
  Filled 2023-01-05: qty 1

## 2023-01-05 NOTE — Group Note (Signed)
Date:  01/05/2023 Time:  7:12 PM  Group Topic/Focus:  Structured Activity Group    Participation Level:  Active  Participation Quality:  Appropriate and Attentive  Affect:  Appropriate  Cognitive:  Alert and Appropriate  Insight: Appropriate  Engagement in Group:  Engaged  Modes of Intervention:  Activity  Additional Comments:    Sherese Heyward A Keshia Weare 01/05/2023, 7:12 PM

## 2023-01-05 NOTE — Progress Notes (Signed)
Nursing Shift Note:  1900-0700  Attended Evening Group: yes Medication Compliant: yes  Behavior: cooperative and pleasant Sleep Quality: good Significant Changes: none  The patient stated that she has been preparing for discharge.  No issues to note.

## 2023-01-05 NOTE — Progress Notes (Signed)
[facility] MD Progress Note  01/05/2023 10:57 AM [name]  MRN:  [id number]  Subjective:  Nursing notes, labs, and vital signs reviewed prior to assessment.  She continues to deny depression and anxiety "not right now".  "I slept really good last night, the Trazodone increase helped."  Her appetite is "good".  She denies cravings and withdrawals from alcohol, agreeable to start naltrexone to assist with cravings and promote recovery.  Discharge is planned tomorrow and she feels ready, will continue at [facility].  Principal Problem: MDD (major depressive disorder), recurrent severe, without psychosis (HCC) Diagnosis: Principal Problem:   MDD (major depressive disorder), recurrent severe, without psychosis (HCC)  Total Time spent with patient: 30 minutes  Past Psychiatric History: depression, anxiety  Past Medical History:  Past Medical History:  Diagnosis Date   Anxiety    Asthma    Depression    doing good, not on meds now   GERD (gastroesophageal reflux disease)    Headache    migraines   History of kidney stones    Hypertension    Kidney stones    Missed abortion 10/02/2013   Ovarian cyst    Pregnancy induced hypertension    Suicidal intent    Vaginal Pap smear, abnormal    bx, f/u ok    Past Surgical History:  Procedure Laterality Date   CESAREAN SECTION     CESAREAN SECTION N/A 06/06/2015   Procedure: CESAREAN SECTION;  Surgeon: Kathreen Cosier, MD;  Location: [facility];  Service: Obstetrics;  Laterality: N/A;   CHOLECYSTECTOMY N/A 08/23/2016   Procedure: LAPAROSCOPIC CHOLECYSTECTOMY;  Surgeon: Berna Bue, MD;  Location: [facility] OR;  Service: General;  Laterality: N/A;   DILATION AND CURETTAGE OF UTERUS     DILATION AND EVACUATION N/A 10/04/2013   Procedure: DILATATION AND EVACUATION;  Surgeon: Sherian Rein, MD;  Location: [facility]S;  Service: Gynecology;  Laterality: N/A;  [place] in room    TONSILLECTOMY     TUBAL LIGATION     Family History:  Family  History  Problem Relation Age of Onset   Hearing loss Mother    Asthma Mother    Asthma Father    Diabetes Son    Cancer Maternal Grandfather        bone   Liver disease Maternal Grandfather    Asthma Sister    Lupus Sister    Family Psychiatric  History: see above Social History:  Social History   Substance and Sexual Activity  Alcohol Use Yes   Alcohol/week: 4.0 standard drinks of alcohol   Types: 4 Cans of beer per week     Social History   Substance and Sexual Activity  Drug Use No    Social History   Socioeconomic History   Marital status: Single    Spouse name: Not on file   Number of children: Not on file   Years of education: Not on file   Highest education level: Not on file  Occupational History   Not on file  Tobacco Use   Smoking status: Every Day    Current packs/day: 0.25    Average packs/day: 0.3 packs/day for 15.0 years (3.8 ttl pk-yrs)    Types: Cigarettes   Smokeless tobacco: Never   Tobacco comments:    Smoking 2-3 cigs a day. 03/07/2022 Tay  Vaping Use   Vaping status: Never Used  Substance and Sexual Activity   Alcohol use: Yes    Alcohol/week: 4.0 standard drinks of alcohol  Types: 4 Cans of beer per week   Drug use: No   Sexual activity: Yes    Partners: Male    Birth control/protection: Surgical  Other Topics Concern   Not on file  Social History Narrative   Not on file   Social Determinants of Health   Financial Resource Strain: Medium Risk (10/01/2022)   Received from [facility], [facility]   Overall Financial Resource Strain (CARDIA)    Difficulty of Paying Living Expenses: Somewhat hard  Food Insecurity: Food Insecurity Present (01/01/2023)   Hunger Vital Sign    Worried About Running Out of Food in the Last Year: Sometimes true    Ran Out of Food in the Last Year: Sometimes true  Transportation Needs: No Transportation Needs (01/01/2023)   PRAPARE - Administrator, Civil Service (Medical): No    Lack  of Transportation (Non-Medical): No  Physical Activity: Insufficiently Active (10/01/2022)   Received from [facility], [facility]   Exercise Vital Sign    Days of Exercise per Week: 2 days    Minutes of Exercise per Session: 30 min  Stress: Stress Concern Present (10/01/2022)   Received from [facility], [facility] Occupational [facility] - Occupational Stress Questionnaire    Feeling of Stress : To some extent  Social Connections: Socially Integrated (10/01/2022)   Received from [facility], [facility]   Social Network    How would you rate your social network (family, work, friends)?: Good participation with social networks   Additional Social History: lives with her young daughter    Sleep:  good   Appetite:  Good  Current Medications: Current Facility-Administered Medications  Medication Dose Route Frequency Provider Last Rate Last Admin   albuterol (VENTOLIN HFA) 108 (90 Base) MCG/ACT inhaler 1-2 puff  1-2 puff Inhalation Q6H PRN Karie Fetch, MD   2 puff at 01/05/23 0921   alum & mag hydroxide-simeth (MAALOX/MYLANTA) 200-200-20 MG/5ML suspension 30 mL  30 mL Oral Q4H PRN Karie Fetch, MD       busPIRone (BUSPAR) tablet 10 mg  10 mg Oral TID Willeen Cass, Christal H, NP   10 mg at 01/05/23 0913   clonazePAM (KLONOPIN) tablet 1 mg  1 mg Oral TID PRN Willeen Cass, Christal H, NP   1 mg at 01/04/23 1949   dextromethorphan-guaiFENesin (MUCINEX DM) 30-600 MG per 12 hr tablet 1 tablet  1 tablet Oral BID Charm Rings, NP   1 tablet at 01/05/23 0916   dicyclomine (BENTYL) capsule 10 mg  10 mg Oral TID AC Bennett, Christal H, NP   10 mg at 01/05/23 0913   diphenhydrAMINE (BENADRYL) capsule 50 mg  50 mg Oral TID PRN Karie Fetch, MD   50 mg at 01/02/23 2215   Or   diphenhydrAMINE (BENADRYL) injection 50 mg  50 mg Intramuscular TID PRN Karie Fetch, MD       fluticasone furoate-vilanterol (BREO ELLIPTA) 200-25 MCG/ACT 1 puff  1 puff Inhalation  Daily Karie Fetch, MD   1 puff at 01/05/23 0917   guaiFENesin (ROBITUSSIN) 100 MG/5ML liquid 5 mL  5 mL Oral Q4H PRN Lewanda Rife, MD   5 mL at 01/05/23 0924   haloperidol (HALDOL) tablet 5 mg  5 mg Oral TID PRN Karie Fetch, MD       Or   haloperidol lactate (HALDOL) injection 5 mg  5 mg Intramuscular TID PRN Karie Fetch, MD       hydrOXYzine (ATARAX) tablet  25 mg  25 mg Oral TID PRN Karie Fetch, MD   25 mg at 01/04/23 1447   ibuprofen (ADVIL) tablet 400 mg  400 mg Oral Q6H PRN Willeen Cass, Christal H, NP   400 mg at 01/04/23 1445   losartan (COZAAR) tablet 50 mg  50 mg Oral Daily Karie Fetch, MD   50 mg at 01/05/23 0912   magnesium hydroxide (MILK OF MAGNESIA) suspension 30 mL  30 mL Oral Daily PRN Karie Fetch, MD       methylphenidate (CONCERTA) CR tablet 27 mg  27 mg Oral q AM Bennett, Christal H, NP   27 mg at 01/05/23 0914   montelukast (SINGULAIR) tablet 10 mg  10 mg Oral QHS Bennett, Christal H, NP   10 mg at 01/04/23 2123   multivitamin with minerals tablet 1 tablet  1 tablet Oral Daily Karie Fetch, MD   1 tablet at 01/05/23 0915   nicotine (NICODERM CQ - dosed in mg/24 hours) patch 21 mg  21 mg Transdermal Daily Lewanda Rife, MD   21 mg at 01/05/23 0917   nicotine polacrilex (NICORETTE) gum 2 mg  2 mg Oral Q4H PRN Willeen Cass, Christal H, NP   2 mg at 01/04/23 1445   pantoprazole (PROTONIX) EC tablet 40 mg  40 mg Oral Daily Karie Fetch, MD   40 mg at 01/05/23 0981   thiamine (VITAMIN B1) tablet 100 mg  100 mg Oral Daily Karie Fetch, MD   100 mg at 01/05/23 0914   tiotropium [facility]) inhalation capsule ([facility] use ONLY) 18 mcg  18 mcg Inhalation Daily Karie Fetch, MD   18 mcg at 01/05/23 1914   traZODone (DESYREL) tablet 200 mg  200 mg Oral QHS PRN Charm Rings, NP   200 mg at 01/04/23 2123   venlafaxine XR (EFFEXOR-XR) 24 hr capsule 75 mg  75 mg Oral Q breakfast Karie Fetch, MD   75 mg at 01/05/23 7829    Lab Results: No results  found for this or any previous visit (from the past 48 hour(s)).  Blood Alcohol level:  Lab Results  Component Value Date   ETH <10 12/30/2022   ETH <10 06/18/2022    Metabolic Disorder Labs: Lab Results  Component Value Date   HGBA1C 4.6 (L) 01/30/2022   MPG 85.32 01/30/2022   MPG 103 07/29/2014   No results found for: "PROLACTIN" Lab Results  Component Value Date   CHOL 232 (H) 01/30/2022   TRIG 157 (H) 01/30/2022   HDL 114 01/30/2022   CHOLHDL 2.0 01/30/2022   VLDL 31 01/30/2022   LDLCALC 87 01/30/2022    Physical Findings: AIMS:  , ,  ,  ,    CIWA:  CIWA-Ar Total: 0 COWS:     Musculoskeletal: Strength & Muscle Tone: within normal limits Gait & Station: normal Patient leans: N/A  Psychiatric Specialty Exam: Physical Exam Vitals and nursing note reviewed.  Constitutional:      Appearance: Normal appearance.  HENT:     Head: Normocephalic.     Nose: Nose normal.  Pulmonary:     Effort: Pulmonary effort is normal.  Musculoskeletal:        General: Normal range of motion.     Cervical back: Normal range of motion.  Neurological:     General: No focal deficit present.     Mental Status: She is alert and oriented to person, place, and time.  Psychiatric:        Attention and Perception: Attention and  perception normal.        Mood and Affect: Mood and affect normal.        Speech: Speech normal.        Behavior: Behavior normal. Behavior is cooperative.        Thought Content: Thought content normal.        Cognition and Memory: Cognition and memory normal.        Judgment: Judgment normal.     Review of Systems  Respiratory:  Positive for cough.   Psychiatric/Behavioral:  Positive for substance abuse.   All other systems reviewed and are negative.   Blood pressure (!) 144/97, pulse (!) 121, temperature 98.1 F (36.7 C), resp. rate 20, height 5\' 4"  (1.626 m), weight 86.2 kg, last menstrual period 12/16/2022, SpO2 92%.Body mass index is 32.61 kg/m.   General Appearance: Casual  Eye Contact:  Good  Speech:  Normal Rate  Volume:  Normal  Mood:  Euthymic  Affect:  Congruent  Thought Process:  Coherent  Orientation:  Full (Time, Place, and Person)  Thought Content:  Logical  Suicidal Thoughts:  No  Homicidal Thoughts:  No  Memory:  Immediate;   Fair Recent;   Fair Remote;   Fair  Judgement:  Fair  Insight:  Fair  Psychomotor Activity:  Increased  Concentration:  Concentration: Fair and Attention Span: Fair  Recall:  [employer] of Knowledge:  Fair  Language:  Good  Akathisia:  No  Handed:  Right  AIMS (if indicated):     Assets:  Housing Leisure Time Physical Health Resilience Social Support  ADL's:  Intact  Cognition:  WNL  Sleep:         Physical Exam: Physical Exam Vitals and nursing note reviewed.  Constitutional:      Appearance: Normal appearance.  HENT:     Head: Normocephalic.     Nose: Nose normal.  Pulmonary:     Effort: Pulmonary effort is normal.  Musculoskeletal:        General: Normal range of motion.     Cervical back: Normal range of motion.  Neurological:     General: No focal deficit present.     Mental Status: She is alert and oriented to person, place, and time.  Psychiatric:        Attention and Perception: Attention and perception normal.        Mood and Affect: Mood and affect normal.        Speech: Speech normal.        Behavior: Behavior normal. Behavior is cooperative.        Thought Content: Thought content normal.        Cognition and Memory: Cognition and memory normal.        Judgment: Judgment normal.    Review of Systems  Respiratory:  Positive for cough.   Psychiatric/Behavioral:  Positive for substance abuse.   All other systems reviewed and are negative.  Blood pressure (!) 144/97, pulse (!) 121, temperature 98.1 F (36.7 C), resp. rate 20, height 5\' 4"  (1.626 m), weight 86.2 kg, last menstrual period 12/16/2022, SpO2 92%. Body mass index is 32.61  kg/m.   Treatment Plan Summary: Daily contact with patient to assess and evaluate symptoms and progress in treatment, Medication management, and Plan : Major depressive disorder, recurrent, severe without psychosis: Effexor XR 75 mg daily  Anxiety: Buspar 10 mg TID Klonopin 1 mg TID PRN Hydroxyzine 25 mg TID PRN  Insomnia: Trazodone 200 mg at bedtime  ADHD: Concerta 27 mg daily  Alcohol use disorder: Naltrexone 50 mg daily started Encouraged 12-step programs   Nanine Means, NP 01/05/2023, 10:57 AM

## 2023-01-05 NOTE — Group Note (Unsigned)
Date:  01/05/2023 Time:  9:31 PM  Group Topic/Focus:  Wrap-Up Group:   The focus of this group is to help patients review their daily goal of treatment and discuss progress on daily workbooks.     Participation Level:  {BHH PARTICIPATION WUJWJ:19147}  Participation Quality:  {BHH PARTICIPATION QUALITY:22265}  Affect:  {BHH AFFECT:22266}  Cognitive:  {BHH COGNITIVE:22267}  Insight: {BHH Insight2:20797}  Engagement in Group:  {BHH ENGAGEMENT IN WGNFA:21308}  Modes of Intervention:  {BHH MODES OF INTERVENTION:22269}  Additional Comments:  ***  Belva Crome 01/05/2023, 9:31 PM

## 2023-01-05 NOTE — Progress Notes (Signed)
Nursing Shift Note:  1900-0700  Attended Evening Group: n/a  Medication Compliant: yes  Behavior: cooperative and pleasant Sleep Quality: good Significant Changes: none  The patient is requesting lozenges for a sore throat.  No significant changes to report.

## 2023-01-05 NOTE — Group Note (Unsigned)
Date:  01/05/2023 Time:  9:34 PM  Group Topic/Focus:  Wrap-Up Group:   The focus of this group is to help patients review their daily goal of treatment and discuss progress on daily workbooks.     Participation Level:  {BHH PARTICIPATION AOZHY:86578}  Participation Quality:  {BHH PARTICIPATION QUALITY:22265}  Affect:  {BHH AFFECT:22266}  Cognitive:  {BHH COGNITIVE:22267}  Insight: {BHH Insight2:20797}  Engagement in Group:  {BHH ENGAGEMENT IN IONGE:95284}  Modes of Intervention:  {BHH MODES OF INTERVENTION:22269}  Additional Comments:  ***  Belva Crome 01/05/2023, 9:34 PM

## 2023-01-05 NOTE — Group Note (Signed)
Date:  01/05/2023 Time:  10:02 PM  Group Topic/Focus:  Wrap-Up Group:   The focus of this group is to help patients review their daily goal of treatment and discuss progress on daily workbooks.    Participation Level:  Active  Participation Quality:  Appropriate  Affect:  Appropriate  Cognitive:  Appropriate  Insight: Appropriate  Engagement in Group:  Supportive  Modes of Intervention:  Support  Additional Comments:     Belva Crome 01/05/2023, 10:02 PM

## 2023-01-05 NOTE — Plan of Care (Signed)
D- Patient alert and oriented. Mood is good Pt is looking forward to discharge. Denies SI, HI, AVH, and pain. Pt requested and received prn clonazepam for anxiety 9/10 A- Scheduled medications administered to patient, per MD orders. Support and encouragement provided.  Routine safety checks conducted every 15 minutes.  Patient informed to notify staff with problems or concerns. R- No adverse drug reactions noted. Patient contracts for safety at this time. Patient compliant with medications and treatment plan. Patient receptive, calm, and cooperative. Patient interacts well with others on the unit.  Patient remains safe at this time.

## 2023-01-05 NOTE — Progress Notes (Signed)
Pt is complaining of anxiousness at 1624 and received 1 mg clonazepam . Pt reports fair results from medication. Pt is excited about returning home tomorrow and future oriented

## 2023-01-06 DIAGNOSIS — F332 Major depressive disorder, recurrent severe without psychotic features: Secondary | ICD-10-CM | POA: Diagnosis not present

## 2023-01-06 MED ORDER — DICYCLOMINE HCL 20 MG PO TABS
10.0000 mg | ORAL_TABLET | Freq: Three times a day (TID) | ORAL | Status: DC
  Administered 2023-01-06 (×2): 10 mg via ORAL
  Filled 2023-01-06 (×3): qty 1

## 2023-01-06 MED ORDER — NALTREXONE HCL 50 MG PO TABS: 50.0000 mg | ORAL_TABLET | Freq: Every day | ORAL | 0 refills | Status: AC

## 2023-01-06 MED ORDER — TRAZODONE HCL 100 MG PO TABS: 200.0000 mg | ORAL_TABLET | Freq: Every evening | ORAL | 0 refills | Status: DC | PRN

## 2023-01-06 NOTE — Progress Notes (Signed)
  [facility] Adult Case Management Discharge Plan :  Will you be returning to the same living situation after discharge:  Yes,  pt reports that she is returning home At discharge, do you have transportation home?: Yes,  pt reports that she will have family and friends provide transportation Do you have the ability to pay for your medications: Yes,  [place] MEDICAID PREPAID HEALTH PLAN / [place] MEDICAID [facility]  Release of information consent forms completed and in the chart;  Patient's signature needed at discharge.  Patient to Follow up at:  Follow-up Information     Center, [facility]. Go on 01/13/2023.   Specialty: Behavioral Health Why: You are scheduled to see your outpatient provider on 01/13/23 at 2:20 PM. They state to bring your insurance card with you to ensure everything is up to date. Contact information: [address] [place] [address] [phone]                 Next level of care provider has access to [facility] Link:no  Safety Planning and Suicide Prevention discussed: Yes,  SPE completed with the patient.     Has patient been referred to the Quitline?: Patient refused referral for treatment  Patient has been referred for addiction treatment: No known substance use disorder.  Harden Mo, LCSW 01/06/2023, 10:30 AM

## 2023-01-06 NOTE — Group Note (Signed)
Recreation Therapy Group Note   Group Topic:Problem Solving  Group Date: 01/06/2023 Start Time: 1000 End Time: 1055 Facilitators: Rosina Lowenstein, LRT, CTRS Location:  Craft Room  Group Description: Life Boat. Patients were given the scenario that they are on a boat that is about to become shipwrecked, leaving them stranded on an [place]. They are asked to make a list of 15 different items that they want to take with them when they are stranded on the [place]. Patients are asked to rank their items from most important to least important, #1 being the most important and #15 being the least. Patients will work individually for the first round to come up with 15 items and then pair up with a peer(s) to condense their list and come up with one list of 15 items between the two of them. Patients or LRT will read aloud the 15 different items to the group after each round. LRT facilitated post-activity processing to discuss how this activity can be used in daily life post discharge.   Goal Area(s) Addressed:  Patient will identify priorities, wants and needs. Patient will communicate with LRT and peers. Patient will work collectively as a Administrator, Civil Service. Patient will work on Product manager.    Affect/Mood: Appropriate   Participation Level: Active and Engaged   Participation Quality: Independent   Behavior: Appropriate, Calm, and Cooperative   Speech/Thought Process: Coherent   Insight: Good   Judgement: Good   Modes of Intervention: Activity   Patient Response to Interventions:  Attentive, Engaged, Interested , and Receptive   Education Outcome:  Acknowledges education   Clinical Observations/Individualized Feedback: Jaciana was active in their participation of session activities and group discussion. Pt identified "water, rope, gun, matches, hygiene items" as things she will bring with her on the [place]. Pt interacted well with LRT and peers duration of session.   Plan:  Continue to engage patient in RT group sessions 2-3x/week.   Rosina Lowenstein, LRT, CTRS 01/06/2023 11:50 AM

## 2023-01-06 NOTE — BHH Suicide Risk Assessment (Addendum)
[facility] Discharge Suicide Risk Assessment   Principal Problem: MDD (major depressive disorder), recurrent severe, without psychosis (HCC) Discharge Diagnoses: Principal Problem:   MDD (major depressive disorder), recurrent severe, without psychosis (HCC)   Total Time spent with patient: 45 minutes  Musculoskeletal: Strength & Muscle Tone: within normal limits Gait & Station: normal Patient leans: N/A  Psychiatric Specialty Exam: Physical Exam Vitals and nursing note reviewed.  Constitutional:      Appearance: Normal appearance.  HENT:     Head: Normocephalic.     Nose: Nose normal.  Pulmonary:     Effort: Pulmonary effort is normal.  Musculoskeletal:        General: Normal range of motion.  Neurological:     General: No focal deficit present.     Mental Status: She is alert and oriented to person, place, and time.     Review of Systems  All other systems reviewed and are negative.   Blood pressure (!) 115/93, pulse 87, temperature 97.9 F (36.6 C), resp. rate 18, height 5\' 4"  (1.626 m), weight 86.2 kg, last menstrual period 12/16/2022, SpO2 94%.Body mass index is 32.61 kg/m.  General Appearance: Casual  Eye Contact:  Good  Speech:  Clear and Coherent  Volume:  Normal  Mood:  "I'm fine."  Affect:  Congruent  Thought Process:  Coherent  Orientation:  Full (Time, Place, and Person)  Thought Content:  Logical  Suicidal Thoughts:  No  Homicidal Thoughts:  No  Memory:  Immediate;   Good Recent;   Good Remote;   Good  Judgement:  Good  Insight:  Good  Psychomotor Activity:  Normal  Concentration:  Concentration: Good and Attention Span: Good  Recall:  Good  Fund of Knowledge:  Good  Language:  Good  Akathisia:  No  Handed:  Right  AIMS (if indicated):     Assets:  Housing Leisure Time Physical Health Resilience Social Support  ADL's:  Intact  Cognition:  WNL  Sleep:        Physical Exam: Physical Exam Vitals and nursing note reviewed.  Constitutional:       Appearance: Normal appearance.  HENT:     Head: Normocephalic.     Nose: Nose normal.  Pulmonary:     Effort: Pulmonary effort is normal.  Musculoskeletal:        General: Normal range of motion.  Neurological:     General: No focal deficit present.     Mental Status: She is alert and oriented to person, place, and time.    Review of Systems  All other systems reviewed and are negative.  Blood pressure (!) 115/93, pulse 87, temperature 97.9 F (36.6 C), resp. rate 18, height 5\' 4"  (1.626 m), weight 86.2 kg, last menstrual period 12/16/2022, SpO2 94%. Body mass index is 32.61 kg/m.  Mental Status Per Nursing Assessment::   On Admission:  NA  Demographic Factors:  Divorced or widowed and Caucasian  Loss Factors: NA  Historical Factors: NA  Risk Reduction Factors:   Responsible for children under 18 years of age, Sense of responsibility to family, Living with another person, especially a relative, Positive social support, and Positive therapeutic relationship  Continued Clinical Symptoms:  None  Cognitive Features That Contribute To Risk:  None    Suicide Risk:  Minimal: No identifiable suicidal ideation.  Patients presenting with no risk factors but with morbid ruminations; may be classified as minimal risk based on the severity of the depressive symptoms  Plan Of Care/Follow-up recommendations:  Activity:  as tolerated Diet:  heart healthy diet Major depressive disorder, recurrent, severe without psychosis: Effexor XR 75 mg daily   Anxiety: Buspar 10 mg TID Klonopin 1 mg TID PRN recommend discontinuing especially with alcohol use d/o Hydroxyzine 25 mg TID PRN   Insomnia: Trazodone 200 mg at bedtime   ADHD: Concerta 27 mg daily   Alcohol use disorder: Naltrexone 50 mg daily started Encouraged 12-step programs  Melissa Means, NP 01/06/2023, 7:26 AM

## 2023-01-06 NOTE — Progress Notes (Signed)
   01/06/23 0954  Psych Admission Type (Psych Patients Only)  Admission Status Involuntary  Psychosocial Assessment  Patient Complaints None  Eye Contact Fair  Facial Expression Anxious  Affect Anxious  Speech Logical/coherent  Interaction Assertive  Motor Activity Slow  Appearance/Hygiene Unremarkable  Behavior Characteristics Cooperative  Mood Pleasant  Thought Process  Coherency WDL  Content WDL  Delusions None reported or observed  Perception WDL  Hallucination None reported or observed  Judgment WDL  Confusion None  Danger to Self  Current suicidal ideation? Denies  Agreement Not to Harm Self Yes  Description of Agreement Verbal  Danger to Others  Danger to Others None reported or observed   Patient is alert and oriented X4.  Patient denies SI/HI/AVH. Patient denies depression today.  Patient given discharge and med teaching.  [facility] Transition Record reviewed and given to patient.  All belongings returned to patient.  Patient escorted to medical mall by staff.  Patient in stable condition.

## 2023-01-06 NOTE — Discharge Summary (Addendum)
Physician Discharge Summary Note  Patient:  [name] is an 42 y.o., female MRN:  [id number] DOB:  November 21, 1980 Patient phone:  [phone] (home)  Patient address:   [address] [place] [address],  Total Time spent with patient: 45 minutes  Date of Admission:  01/01/2023 Date of Discharge: 01/06/2023  Reason for Admission:  suicidal ideations  Principal Problem: MDD (major depressive disorder), recurrent severe, without psychosis (HCC) Discharge Diagnoses: Principal Problem:   MDD (major depressive disorder), recurrent severe, without psychosis (HCC)   Past Psychiatric History: depression, anxiety, ADHD  Past Medical History:  Past Medical History:  Diagnosis Date   Anxiety    Asthma    Depression    doing good, not on meds now   GERD (gastroesophageal reflux disease)    Headache    migraines   History of kidney stones    Hypertension    Kidney stones    Missed abortion 10/02/2013   Ovarian cyst    Pregnancy induced hypertension    Suicidal intent    Vaginal Pap smear, abnormal    bx, f/u ok    Past Surgical History:  Procedure Laterality Date   CESAREAN SECTION     CESAREAN SECTION N/A 06/06/2015   Procedure: CESAREAN SECTION;  Surgeon: Kathreen Cosier, MD;  Location: [facility];  Service: Obstetrics;  Laterality: N/A;   CHOLECYSTECTOMY N/A 08/23/2016   Procedure: LAPAROSCOPIC CHOLECYSTECTOMY;  Surgeon: Berna Bue, MD;  Location: [facility];  Service: General;  Laterality: N/A;   DILATION AND CURETTAGE OF UTERUS     DILATION AND EVACUATION N/A 10/04/2013   Procedure: DILATATION AND EVACUATION;  Surgeon: Sherian Rein, MD;  Location: [facility];  Service: Gynecology;  Laterality: N/A;  [place] in room    TONSILLECTOMY     TUBAL LIGATION     Family History:  Family History  Problem Relation Age of Onset   Hearing loss Mother    Asthma Mother    Asthma Father    Diabetes Son    Cancer Maternal Grandfather        bone   Liver disease Maternal  Grandfather    Asthma Sister    Lupus Sister    Family Psychiatric  History: none Social History:  Social History   Substance and Sexual Activity  Alcohol Use Yes   Alcohol/week: 4.0 standard drinks of alcohol   Types: 4 Cans of beer per week     Social History   Substance and Sexual Activity  Drug Use No    Social History   Socioeconomic History   Marital status: Single    Spouse name: Not on file   Number of children: Not on file   Years of education: Not on file   Highest education level: Not on file  Occupational History   Not on file  Tobacco Use   Smoking status: Every Day    Current packs/day: 0.25    Average packs/day: 0.3 packs/day for 15.0 years (3.8 ttl pk-yrs)    Types: Cigarettes   Smokeless tobacco: Never   Tobacco comments:    Smoking 2-3 cigs a day. 03/07/2022 Tay  Vaping Use   Vaping status: Never Used  Substance and Sexual Activity   Alcohol use: Yes    Alcohol/week: 4.0 standard drinks of alcohol    Types: 4 Cans of beer per week   Drug use: No   Sexual activity: Yes    Partners: Male    Birth control/protection: Surgical  Other  Topics Concern   Not on file  Social History Narrative   Not on file   Social Determinants of Health   Financial Resource Strain: Medium Risk (10/01/2022)   Received from [facility], [facility]   Overall Financial Resource Strain (CARDIA)    Difficulty of Paying Living Expenses: Somewhat hard  Food Insecurity: Food Insecurity Present (01/01/2023)   Hunger Vital Sign    Worried About Running Out of Food in the Last Year: Sometimes true    Ran Out of Food in the Last Year: Sometimes true  Transportation Needs: No Transportation Needs (01/01/2023)   PRAPARE - Administrator, Civil Service (Medical): No    Lack of Transportation (Non-Medical): No  Physical Activity: Insufficiently Active (10/01/2022)   Received from [facility], [facility]   Exercise Vital Sign    Days of Exercise per Week: 2  days    Minutes of Exercise per Session: 30 min  Stress: Stress Concern Present (10/01/2022)   Received from [facility], [facility] - Occupational Stress Questionnaire    Feeling of Stress : To some extent  Social Connections: Socially Integrated (10/01/2022)   Received from [facility], [facility]   Social Network    How would you rate your social network (family, work, friends)?: Good participation with social networks    Hospital Course:   42 yo female admitted for an increase in depression and anxiety with suicidal ideations.  Medications were adjusted and therapy initiated.  Her symptoms resolved and she denies suicidal/homicidal ideations, hallucinations, withdrawal symptoms and cravings.  She has met maximum benefit of hospitalization.  Discharge instructions provided with explanations along with crisis numbers, Rx, and follow up appointment information.  Physical Findings: AIMS:  , ,  ,  ,    CIWA:  CIWA-Ar Total: 0 COWS:     Musculoskeletal: Strength & Muscle Tone: within normal limits Gait & Station: normal Patient leans: N/A   Psychiatric Specialty Exam: Physical Exam Vitals and nursing note reviewed.  Constitutional:      Appearance: Normal appearance.  HENT:     Head: Normocephalic.     Nose: Nose normal.  Pulmonary:     Effort: Pulmonary effort is normal.  Musculoskeletal:        General: Normal range of motion.  Neurological:     General: No focal deficit present.     Mental Status: She is alert and oriented to person, place, and time.       Review of Systems  All other systems reviewed and are negative.    Blood pressure (!) 115/93, pulse 87, temperature 97.9 F (36.6 C), resp. rate 18, height 5\' 4"  (1.626 m), weight 86.2 kg, last menstrual period 12/16/2022, SpO2 94%.Body mass index is 32.61 kg/m.  General Appearance: Casual  Eye Contact:  Good  Speech:  Clear and Coherent  Volume:  Normal  Mood:   "I'm fine."  Affect:  Congruent  Thought Process:  Coherent  Orientation:  Full (Time, Place, and Person)  Thought Content:  Logical  Suicidal Thoughts:  No  Homicidal Thoughts:  No  Memory:  Immediate;   Good Recent;   Good Remote;   Good  Judgement:  Good  Insight:  Good  Psychomotor Activity:  Normal  Concentration:  Concentration: Good and Attention Span: Good  Recall:  Good  Fund of Knowledge:  Good  Language:  Good  Akathisia:  No  Handed:  Right  AIMS (if  indicated):     Assets:  Housing Leisure Time Physical Health Resilience Social Support  ADL's:  Intact  Cognition:  WNL  Sleep:         Physical Exam: Physical Exam Vitals and nursing note reviewed.  Constitutional:      Appearance: Normal appearance.  HENT:     Head: Normocephalic.     Nose: Nose normal.  Pulmonary:     Effort: Pulmonary effort is normal.  Musculoskeletal:        General: Normal range of motion.     Cervical back: Normal range of motion.  Neurological:     General: No focal deficit present.     Mental Status: She is alert and oriented to person, place, and time.    Review of Systems  All other systems reviewed and are negative.  Blood pressure (!) 115/93, pulse 87, temperature 97.9 F (36.6 C), resp. rate 18, height 5\' 4"  (1.626 m), weight 86.2 kg, last menstrual period 12/16/2022, SpO2 94%. Body mass index is 32.61 kg/m.   Social History   Tobacco Use  Smoking Status Every Day   Current packs/day: 0.25   Average packs/day: 0.3 packs/day for 15.0 years (3.8 ttl pk-yrs)   Types: Cigarettes  Smokeless Tobacco Never  Tobacco Comments   Smoking 2-3 cigs a day. 03/07/2022 Tay   Tobacco Cessation:  A prescription for an FDA-approved tobacco cessation medication provided at discharge   Blood Alcohol level:  Lab Results  Component Value Date   [facility] <10 12/30/2022   ETH <10 06/18/2022    Metabolic Disorder Labs:  Lab Results  Component Value Date   HGBA1C 4.6 (L)  01/30/2022   MPG 85.32 01/30/2022   MPG 103 07/29/2014   No results found for: "PROLACTIN" Lab Results  Component Value Date   CHOL 232 (H) 01/30/2022   TRIG 157 (H) 01/30/2022   HDL 114 01/30/2022   CHOLHDL 2.0 01/30/2022   VLDL 31 01/30/2022   LDLCALC 87 01/30/2022    See Psychiatric Specialty Exam and Suicide Risk Assessment completed by Attending Physician prior to discharge.  Discharge destination:  Home  Is patient on multiple antipsychotic therapies at discharge:  No   Has Patient had three or more failed trials of antipsychotic monotherapy by history:  No  Recommended Plan for Multiple Antipsychotic Therapies: NA  Discharge Instructions     Diet - low sodium heart healthy   Complete by: As directed    Discharge instructions   Complete by: As directed    Follow up with outpatient appointment established by social work   Increase activity slowly   Complete by: As directed       Allergies as of 01/06/2023       Reactions   Alprazolam Itching   Oxycodone Itching   Penicillins Nausea And Vomiting, Other (See Comments)   Has patient had a PCN reaction causing immediate rash, facial/tongue/throat swelling, SOB or lightheadedness with hypotension:No Has patient had a PCN reaction causing severe rash involving mucus membranes or skin necrosis:No Has patient had a PCN reaction that REACTION THAT REQUIRED HOSPITALIZATION:  #  #  #  YES  #  #  #  Has patient had a PCN reaction occurring within the last 10 years: #  #  #  YES  #  #  #    Norvasc [amlodipine Besylate] Other (See Comments)   Tip of tongue became numb and caused flushing   Prednisone Other (See Comments), Swelling   FACIAL  swelling- breathing not affected, though        Medication List     STOP taking these medications    clonazePAM 1 MG tablet Commonly known as: KLONOPIN   ibuprofen 200 MG tablet Commonly known as: ADVIL   montelukast 10 MG tablet Commonly known as: SINGULAIR       TAKE  these medications      Indication  busPIRone 10 MG tablet Commonly known as: BUSPAR Take 10 mg by mouth 3 (three) times daily.  Indication: Anxiety Disorder   dicyclomine 10 MG capsule Commonly known as: BENTYL Take 10 mg by mouth 3 (three) times daily before meals.  Indication: Irritable Bowel Syndrome   famotidine 40 MG tablet Commonly known as: PEPCID Take 1 tablet (40 mg total) by mouth at bedtime.  Indication: Gastroesophageal Reflux Disease   fluticasone-salmeterol 230-21 MCG/ACT inhaler Commonly known as: ADVAIR HFA INHALE 2 PUFFS INTO THE LUNGS TWICE A DAY What changed: See the new instructions.  Indication: Asthma   hydrOXYzine 25 MG capsule Commonly known as: VISTARIL Take 1 capsule (25 mg total) by mouth 3 (three) times daily as needed for anxiety.  Indication: Feeling Anxious   loratadine 10 MG tablet Commonly known as: Claritin Take 1 tablet (10 mg total) by mouth daily.    losartan 50 MG tablet Commonly known as: COZAAR Take 50 mg by mouth daily.  Indication: High Blood Pressure Disorder   methylphenidate 27 MG CR tablet Commonly known as: CONCERTA Take 27 mg by mouth in the morning.  Indication: Attention Deficit Hyperactivity Disorder   naltrexone 50 MG tablet Commonly known as: DEPADE Take 1 tablet (50 mg total) by mouth daily.  Indication: Abuse or Misuse of Alcohol   nicotine 21 mg/24hr patch Commonly known as: NICODERM CQ - dosed in mg/24 hours Place 1 patch (21 mg total) onto the skin daily.  Indication: Nicotine Addiction   ondansetron 4 MG disintegrating tablet Commonly known as: ZOFRAN-ODT Take 1 tablet (4 mg total) by mouth every 8 (eight) hours as needed for nausea or vomiting.  Indication: Nausea and Vomiting   pantoprazole 40 MG tablet Commonly known as: PROTONIX TAKE 1 TABLET BY MOUTH EVERY DAY What changed: when to take this  Indication: Gastroesophageal Reflux Disease   solifenacin 5 MG tablet Commonly known as:  VESICARE Take 5 mg by mouth daily.  Indication: Urinary Urgency   Spiriva Respimat 2.5 MCG/ACT Aers Generic drug: Tiotropium Bromide Monohydrate Inhale 2 puffs into the lungs daily.  Indication: Chronic Obstructive Lung Disease   traZODone 100 MG tablet Commonly known as: DESYREL Take 2 tablets (200 mg total) by mouth at bedtime as needed for sleep.  Indication: Trouble Sleeping   venlafaxine XR 75 MG 24 hr capsule Commonly known as: EFFEXOR-XR Take 1 tablet by mouth daily.  Indication: Major Depressive Disorder         Follow-up recommendations:   Activity:  as tolerated Diet:  heart healthy diet Major depressive disorder, recurrent, severe without psychosis: Effexor XR 75 mg daily   Anxiety: Buspar 10 mg TID Klonopin 1 mg TID PRN recommend discontinuing especially with alcohol use d/o Hydroxyzine 25 mg TID PRN   Insomnia: Trazodone 200 mg at bedtime   ADHD: Concerta 27 mg daily   Alcohol use disorder: Naltrexone 50 mg daily started Encouraged 12-step programs  Comments:  follow up with appointment provided by social work along with a 12-step program to continue recovery from alcohol   Signed: Nanine Means, NP 01/06/2023, 9:00 AM

## 2023-01-06 NOTE — Group Note (Signed)
Date:  01/06/2023 Time:  10:50 AM  Group Topic/Focus:  Self Care:   The focus of this group is to help patients understand the importance of self-care in order to improve or restore emotional, physical, spiritual, interpersonal, and financial health.    Participation Level:  Active  Participation Quality:  Appropriate and Attentive  Affect:  Appropriate and Excited  Cognitive:  Alert, Appropriate, and Oriented  Insight: Appropriate  Engagement in Group:  Developing/Improving, Engaged, and Supportive  Modes of Intervention:  Activity, Discussion, and Support  Additional Comments:    Roxy Mastandrea 01/06/2023, 10:50 AM

## 2023-01-08 ENCOUNTER — Other Ambulatory Visit: Payer: Self-pay | Admitting: Nurse Practitioner

## 2023-01-08 DIAGNOSIS — J441 Chronic obstructive pulmonary disease with (acute) exacerbation: Secondary | ICD-10-CM

## 2023-01-13 ENCOUNTER — Ambulatory Visit: Payer: Medicaid Other | Admitting: Internal Medicine

## 2023-01-14 ENCOUNTER — Ambulatory Visit: Payer: Medicaid Other | Admitting: Internal Medicine

## 2023-01-14 ENCOUNTER — Telehealth (INDEPENDENT_AMBULATORY_CARE_PROVIDER_SITE_OTHER): Payer: Medicaid Other | Admitting: Internal Medicine

## 2023-01-14 ENCOUNTER — Encounter: Payer: Self-pay | Admitting: Internal Medicine

## 2023-01-14 DIAGNOSIS — G4710 Hypersomnia, unspecified: Secondary | ICD-10-CM | POA: Diagnosis not present

## 2023-01-14 DIAGNOSIS — F17210 Nicotine dependence, cigarettes, uncomplicated: Secondary | ICD-10-CM | POA: Diagnosis not present

## 2023-01-14 DIAGNOSIS — J4489 Other specified chronic obstructive pulmonary disease: Secondary | ICD-10-CM

## 2023-01-14 NOTE — Patient Instructions (Addendum)
Please schedule follow up scheduled with myself in 6 months.  If my schedule is not open yet, we will contact you with a reminder closer to that time. Please call [phone] if you haven't heard from [place] a month before.   continue spiriva, advair. continue albuterol Smoking cessation discussed - Nicotine patches/gum continue protonix.

## 2023-01-14 NOTE — Progress Notes (Signed)
I connected with [name] on 01/14/2023 by video enabled telemedicine application and verified that I am speaking with the correct person using two identifiers. Patient is at home, Physician is in office.    I discussed the limitations of evaluation and management by telemedicine. The patient expressed understanding and agreed to proceed.         [name]    [id number]    10-23-1980  Primary Care Physician:Ibarra, Melissa Amend, NP Date of Appointment: 01/14/2023 Established Patient Visit  Chief complaint:   Chief Complaint  Patient presents with   Follow-up    Sleep f/u, no other concerns     HPI: Poorly controlled Moderate persistent asthma with copd overlap syndrome with ongoing tobacco use disorder.  Hospitalized April 2023, Nov 2023, and Jan 2024 for copd exacerbation. Has stopped wearing oxygen.   Interval Updates: Here for follow up. Sleep study was too expensive. Hasn't needed any prednisone since last visit.   Smoking 5-6 cigarettes/day.   Albuterol use is mostly once daily before bed.  Current Regimen: on spiriva and advair ventolin.  Asthma Triggers: cold weather/hot weather, animals, cigarettes, anxiety Exacerbations in the last year: one History of hospitalization or intubation: yes, 3 times. Allergy Testing: never had.  GERD: yes controlled on PPI Allergic Rhinitis:yes ACT:  Asthma Control Test ACT Total Score  03/27/2021  3:53 PM 12  07/15/2019  4:04 PM 11  03/24/2019  2:29 PM 7   FeNO: n/a  I have reviewed the patient's family social and past medical history and updated as appropriate.   Past Medical History:  Diagnosis Date   Anxiety    Asthma    Depression    doing good, not on meds now   GERD (gastroesophageal reflux disease)    Headache    migraines   History of kidney stones    Hypertension    Kidney stones    Missed abortion 10/02/2013   Ovarian cyst    Pregnancy induced hypertension    Suicidal intent    Vaginal  Pap smear, abnormal    bx, f/u ok    Past Surgical History:  Procedure Laterality Date   CESAREAN SECTION     CESAREAN SECTION N/A 06/06/2015   Procedure: CESAREAN SECTION;  Surgeon: Melissa Cosier, MD;  Location: [facility];  Service: Obstetrics;  Laterality: N/A;   CHOLECYSTECTOMY N/A 08/23/2016   Procedure: LAPAROSCOPIC CHOLECYSTECTOMY;  Surgeon: Melissa Bue, MD;  Location: [facility];  Service: General;  Laterality: N/A;   DILATION AND CURETTAGE OF UTERUS     DILATION AND EVACUATION N/A 10/04/2013   Procedure: DILATATION AND EVACUATION;  Surgeon: Melissa Rein, MD;  Location: [facility];  Service: Gynecology;  Laterality: N/A;  [place] in room    TONSILLECTOMY     TUBAL LIGATION      Family History  Problem Relation Age of Onset   Hearing loss Mother    Asthma Mother    Asthma Father    Diabetes Son    Cancer Maternal Grandfather        bone   Liver disease Maternal Grandfather    Asthma Sister    Lupus Sister     Social History   Occupational History   Not on file  Tobacco Use   Smoking status: Every Day    Current packs/day: 0.25    Average packs/day: 0.3 packs/day for 15.0 years (3.8 ttl pk-yrs)    Types: Cigarettes   Smokeless tobacco: Never   Tobacco comments:  Smoking 2-3 cigs a day. 03/07/2022 Tay  Vaping Use   Vaping status: Never Used  Substance and Sexual Activity   Alcohol use: Yes    Alcohol/week: 4.0 standard drinks of alcohol    Types: 4 Cans of beer per week   Drug use: No   Sexual activity: Yes    Partners: Male    Birth control/protection: Surgical    Physical Exam: Last menstrual period 12/16/2022.  Breathing nonlabored, no audible wheezing Able to complete full sentences  Data Reviewed: Imaging: I have personally reviewed the chest xray from 02/2019 which demonstrates no acute cardiopulmonary process  PFTs:     Latest Ref Rng & Units 11/16/2015   12:47 PM  PFT Results  FVC-Pre L 3.54   FVC-Predicted Pre % 94   Pre FEV1/FVC % %  55   FEV1-Pre L 1.96   FEV1-Predicted Pre % 62   DLCO uncorrected ml/min/mmHg 23.29   DLCO UNC% % 95   DLVA Predicted % 97   TLC L 8.11   TLC % Predicted % 160   RV % Predicted % 292    I have personally reviewed the patient's PFTs and which demonstrate moderate airflow limitation   Labs: Lab Results  Component Value Date   WBC 9.7 12/30/2022   HGB 13.3 12/30/2022   HCT 42.3 12/30/2022   MCV 94.0 12/30/2022   PLT 337 12/30/2022   Lab Results  Component Value Date   NA 142 12/30/2022   K 3.9 12/30/2022   CL 103 12/30/2022   CO2 27 12/30/2022    Immunization status: Immunization History  Administered Date(s) Administered   Influenza Inj Mdck Quad Pf 04/04/2021, 02/19/2022   Influenza,inj,Quad PF,6+ Mos 07/29/2014, 06/07/2015, 07/21/2017, 02/05/2018, 03/03/2019, 05/25/2022   Influenza-Unspecified 03/24/2012, 07/29/2014, 06/07/2015, 07/21/2017, 02/05/2018, 03/03/2019   PNEUMOCOCCAL CONJUGATE-20 01/04/2023   Pneumococcal Polysaccharide-23 07/29/2014, 06/08/2015   Pneumococcal-Unspecified 07/29/2014, 06/08/2015   Tdap 06/07/2015    Assessment:  Asthma COPD overlap syndrome, 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  GERD Excessive daytime sleepiness - sleep study deferred per patient.   Plan/Recommendations: continue spiriva, advair. continue albuterol Smoking cessation discussed - Nicotine patches/gum continue protonix.    Return to Care: Return in about 6 months (around 07/17/2023).   Melissa Salts, MD Pulmonary and Critical Care Medicine [facility] Office:[phone]

## 2023-01-23 ENCOUNTER — Encounter: Payer: Self-pay | Admitting: Internal Medicine

## 2023-01-23 MED ORDER — NICOTINE 21 MG/24HR TD PT24: 21.0000 mg | MEDICATED_PATCH | Freq: Every day | TRANSDERMAL | 3 refills | Status: DC

## 2023-03-06 ENCOUNTER — Telehealth: Payer: Medicaid Other | Admitting: Internal Medicine

## 2023-03-26 ENCOUNTER — Other Ambulatory Visit: Payer: Self-pay

## 2023-03-26 ENCOUNTER — Emergency Department (HOSPITAL_COMMUNITY)
Admission: EM | Admit: 2023-03-26 | Discharge: 2023-03-26 | Disposition: A | Discharge status: Home / Self Care | Payer: Medicaid Other | Attending: Emergency Medicine | Admitting: Emergency Medicine

## 2023-03-26 ENCOUNTER — Encounter (HOSPITAL_COMMUNITY): Payer: Self-pay

## 2023-03-26 ENCOUNTER — Emergency Department (HOSPITAL_COMMUNITY): Payer: Medicaid Other

## 2023-03-26 DIAGNOSIS — Z20822 Contact with and (suspected) exposure to covid-19: Secondary | ICD-10-CM | POA: Insufficient documentation

## 2023-03-26 DIAGNOSIS — R0602 Shortness of breath: Secondary | ICD-10-CM | POA: Diagnosis present

## 2023-03-26 DIAGNOSIS — J45909 Unspecified asthma, uncomplicated: Secondary | ICD-10-CM | POA: Diagnosis not present

## 2023-03-26 DIAGNOSIS — J4489 Other specified chronic obstructive pulmonary disease: Secondary | ICD-10-CM

## 2023-03-26 DIAGNOSIS — J441 Chronic obstructive pulmonary disease with (acute) exacerbation: Secondary | ICD-10-CM | POA: Insufficient documentation

## 2023-03-26 DIAGNOSIS — Z7951 Long term (current) use of inhaled steroids: Secondary | ICD-10-CM | POA: Diagnosis not present

## 2023-03-26 DIAGNOSIS — M545 Low back pain, unspecified: Secondary | ICD-10-CM | POA: Insufficient documentation

## 2023-03-26 LAB — BASIC METABOLIC PANEL
Anion gap: 8 (ref 5–15)
BUN: <5 mg/dL — ABNORMAL LOW (ref 6–20)
CO2: 23 mmol/L (ref 22–32)
Calcium: 9 mg/dL (ref 8.9–10.3)
Chloride: 106 mmol/L (ref 98–111)
Creatinine, Ser: 0.96 mg/dL (ref 0.44–1.00)
GFR, Estimated: >60 mL/min (ref >60)
Glucose, Bld: 103 mg/dL — ABNORMAL HIGH (ref 70–99)
Potassium: 4 mmol/L (ref 3.5–5.1)
Sodium: 137 mmol/L (ref 135–145)

## 2023-03-26 LAB — CBC WITH DIFFERENTIAL/PLATELET
Abs Immature Granulocytes: 0.03 10*3/uL (ref 0.00–0.07)
Basophils Absolute: 0.1 10*3/uL (ref 0.0–0.1)
Basophils Relative: 1 %
Eosinophils Absolute: 0.4 10*3/uL (ref 0.0–0.5)
Eosinophils Relative: 4 %
HCT: 41.3 % (ref 36.0–46.0)
Hemoglobin: 13.4 g/dL (ref 12.0–15.0)
Immature Granulocytes: 0 %
Lymphocytes Relative: 15 %
Lymphs Abs: 1.3 10*3/uL (ref 0.7–4.0)
MCH: 29.6 pg (ref 26.0–34.0)
MCHC: 32.4 g/dL (ref 30.0–36.0)
MCV: 91.4 fL (ref 80.0–100.0)
Monocytes Absolute: 0.7 10*3/uL (ref 0.1–1.0)
Monocytes Relative: 7 %
Neutro Abs: 6.6 10*3/uL (ref 1.7–7.7)
Neutrophils Relative %: 73 %
Platelets: 338 10*3/uL (ref 150–400)
RBC: 4.52 MIL/uL (ref 3.87–5.11)
RDW: 16 % — ABNORMAL HIGH (ref 11.5–15.5)
WBC: 9.2 10*3/uL (ref 4.0–10.5)
nRBC: 0 % (ref 0.0–0.2)

## 2023-03-26 LAB — RESP PANEL BY RT-PCR (RSV, FLU A&B, COVID)  RVPGX2
Influenza A by PCR: NEGATIVE
Influenza B by PCR: NEGATIVE
Resp Syncytial Virus by PCR: NEGATIVE
SARS Coronavirus 2 by RT PCR: NEGATIVE

## 2023-03-26 LAB — HCG, SERUM, QUALITATIVE: Preg, Serum: NEGATIVE

## 2023-03-26 MED ORDER — ALBUTEROL SULFATE HFA 108 (90 BASE) MCG/ACT IN AERS: 2.0000 | INHALATION_SPRAY | Freq: Four times a day (QID) | RESPIRATORY_TRACT | 5 refills | Status: AC | PRN

## 2023-03-26 MED ORDER — FLUTICASONE-SALMETEROL 230-21 MCG/ACT IN AERO: 2.0000 | INHALATION_SPRAY | Freq: Two times a day (BID) | RESPIRATORY_TRACT | 12 refills | Status: DC

## 2023-03-26 MED ORDER — IPRATROPIUM-ALBUTEROL 0.5-2.5 (3) MG/3ML IN SOLN
3.0000 mL | RESPIRATORY_TRACT | Status: AC
  Administered 2023-03-26 (×3): 3 mL via RESPIRATORY_TRACT
  Filled 2023-03-26 (×3): qty 3

## 2023-03-26 MED ORDER — SPIRIVA RESPIMAT 2.5 MCG/ACT IN AERS: 2.0000 | INHALATION_SPRAY | Freq: Every day | RESPIRATORY_TRACT | 5 refills | Status: DC

## 2023-03-26 MED ORDER — AZITHROMYCIN 250 MG PO TABS: 250.0000 mg | ORAL_TABLET | Freq: Every day | ORAL | 0 refills | Status: AC

## 2023-03-26 MED ORDER — LIDOCAINE 5 % EX PTCH
1.0000 | MEDICATED_PATCH | Freq: Once | CUTANEOUS | Status: DC
  Administered 2023-03-26: 1 via TRANSDERMAL
  Filled 2023-03-26: qty 1

## 2023-03-26 MED ORDER — AZITHROMYCIN 250 MG PO TABS
500.0000 mg | ORAL_TABLET | Freq: Once | ORAL | Status: AC
  Administered 2023-03-26: 500 mg via ORAL
  Filled 2023-03-26: qty 2

## 2023-03-26 MED ORDER — ACETAMINOPHEN 500 MG PO TABS
1000.0000 mg | ORAL_TABLET | Freq: Once | ORAL | Status: AC
  Administered 2023-03-26: 1000 mg via ORAL
  Filled 2023-03-26: qty 2

## 2023-03-26 NOTE — ED Triage Notes (Signed)
Pt c/o  chest pain and trouble breathing since 1 this morning Hx COPD and asthma. Pt states trying to quit smoking.

## 2023-03-26 NOTE — Discharge Instructions (Signed)
You were seen in the emergency department for your shortness of breath.  You were wheezing consistent with your COPD and asthma.  Your chest x-ray showed possible early pneumonia and I have given you a course of antibiotics and you should complete this as prescribed.  I have refilled your inhalers for you and you should continue to take them as prescribed.  You should follow-up with your primary doctor or your pulmonologist in the next few days to have your symptoms rechecked.  You should return to the emergency department for fevers despite the antibiotics, worsening shortness of breath or any other new or concerning symptoms.

## 2023-03-26 NOTE — ED Provider Notes (Signed)
[facility] AT [facility] Provider Note   CSN: [id number] Arrival date & time: 03/26/23  0753     History  Chief Complaint  Patient presents with   Shortness of Breath    [name] is a 42 y.o. female.  Patient is a 42 year old female with a past medical history of asthma, COPD on O2 as needed and bipolar disorder presenting to the emergency department with shortness of breath.  Patient states for the last 3 nights she has been waking up in the middle of the night with shortness of breath.  She states that she has been trying to use her home oxygen without any relief.  She states that she has ran out of all of her inhalers and so has not been able to use them.  She states that she is also still smoking though is trying to cut back which she thinks may have caused her exacerbation.  She states she has an associated dry cough with associated chest pain and chest tightness.  She denies any fevers or lower extremity swelling.  Of note the patient states that she is also been having right sided lower back pain.  She states that she has been taking ibuprofen and using ice and heat, denies any trauma or falls.  The history is provided by the patient.  Shortness of Breath      Home Medications Prior to Admission medications   Medication Sig Start Date End Date Taking? Authorizing Provider  albuterol (VENTOLIN HFA) 108 (90 Base) MCG/ACT inhaler Inhale 2 puffs into the lungs every 6 (six) hours as needed for wheezing or shortness of breath. 03/26/23   Elayne Snare K, DO  azithromycin (ZITHROMAX) 250 MG tablet Take 1 tablet (250 mg total) by mouth daily for 4 days. Starting tomorrow 1 every day until finished. 03/26/23 03/30/23 Yes Kingsley, Benetta Spar K, DO  busPIRone (BUSPAR) 10 MG tablet Take 10 mg by mouth 3 (three) times daily. 08/11/21   [provider]  dicyclomine (BENTYL) 10 MG capsule Take 10 mg by mouth 3 (three) times daily before  meals.    [provider]  famotidine (PEPCID) 40 MG tablet Take 1 tablet (40 mg total) by mouth at bedtime. 08/30/22   Arnaldo Natal, NP  fluticasone-salmeterol (ADVAIR HFA) 230-21 MCG/ACT inhaler INHALE 2 PUFFS INTO THE LUNGS TWICE A DAY Patient taking differently: Inhale 2 puffs into the lungs 2 (two) times daily. 06/12/22   Cobb, Ruby Cola, NP  fluticasone-salmeterol (ADVAIR HFA) 230-21 MCG/ACT inhaler Inhale 2 puffs into the lungs 2 (two) times daily. 03/26/23  Yes Theresia Lo, Benetta Spar K, DO  hydrOXYzine (VISTARIL) 25 MG capsule Take 1 capsule (25 mg total) by mouth 3 (three) times daily as needed for anxiety. 06/22/22   Narda Bonds, MD  loratadine (CLARITIN) 10 MG tablet Take 1 tablet (10 mg total) by mouth daily. 06/22/22 10/16/22  Narda Bonds, MD  losartan (COZAAR) 50 MG tablet Take 50 mg by mouth daily.    [provider]  methylphenidate 27 MG PO CR tablet Take 27 mg by mouth in the morning.    [provider]  nicotine (NICODERM CQ - DOSED IN MG/24 HOURS) 21 mg/24hr patch Place 1 patch (21 mg total) onto the skin daily. 01/23/23   Charlott Holler, MD  ondansetron (ZOFRAN-ODT) 4 MG disintegrating tablet Take 1 tablet (4 mg total) by mouth every 8 (eight) hours as needed for nausea or vomiting. 09/10/22   Small,  Brooke L, PA  pantoprazole (PROTONIX) 40 MG tablet TAKE 1 TABLET BY MOUTH EVERY DAY Patient taking differently: Take 40 mg by mouth daily before breakfast. 12/14/20   Charlott Holler, MD  solifenacin (VESICARE) 5 MG tablet Take 5 mg by mouth daily. 10/02/22   [provider]  Tiotropium Bromide Monohydrate (SPIRIVA RESPIMAT) 2.5 MCG/ACT AERS Inhale 2 puffs into the lungs daily. 03/26/23   Rexford Maus, DO  traZODone (DESYREL) 100 MG tablet Take 2 tablets (200 mg total) by mouth at bedtime as needed for sleep. 01/06/23 02/05/23  Charm Rings, NP  venlafaxine XR (EFFEXOR-XR) 75 MG 24 hr capsule Take 1 tablet by mouth daily. 12/30/22    [provider]      Allergies    Alprazolam, Oxycodone, Penicillins, Norvasc [amlodipine besylate], and Prednisone    Review of Systems   Review of Systems  Respiratory:  Positive for shortness of breath.     Physical Exam Updated Vital Signs BP (!) 156/117   Pulse 92   Temp (!) 97.5 F (36.4 C) (Oral)   Resp 16   SpO2 97%  Physical Exam Vitals and nursing note reviewed.  Constitutional:      General: She is not in acute distress.    Appearance: She is well-developed.  HENT:     Head: Normocephalic and atraumatic.     Mouth/Throat:     Mouth: Mucous membranes are moist.  Eyes:     Extraocular Movements: Extraocular movements intact.  Cardiovascular:     Rate and Rhythm: Normal rate and regular rhythm.  Pulmonary:     Effort: Pulmonary effort is normal.     Breath sounds: Wheezing (Diffuse expiratory) present.  Abdominal:     Palpations: Abdomen is soft.     Tenderness: There is no abdominal tenderness.  Musculoskeletal:        General: Normal range of motion.     Cervical back: Normal range of motion and neck supple.     Right lower leg: No edema.     Left lower leg: No edema.     Comments: No midline back tenderness, right sided lumbar paraspinal muscle tenderness to palpation  Skin:    General: Skin is warm and dry.  Neurological:     General: No focal deficit present.     Mental Status: She is alert and oriented to person, place, and time.  Psychiatric:        Mood and Affect: Mood normal.        Behavior: Behavior normal.     ED Results / Procedures / Treatments   Labs (all labs ordered are listed, but only abnormal results are displayed) Labs Reviewed  BASIC METABOLIC PANEL - Abnormal; Notable for the following components:      Result Value   Glucose, Bld 103 (*)    BUN <5 (*)    All other components within normal limits  CBC WITH DIFFERENTIAL/PLATELET - Abnormal; Notable for the following components:   RDW 16.0 (*)    All other  components within normal limits  RESP PANEL BY RT-PCR (RSV, FLU A&B, COVID)  RVPGX2  HCG, SERUM, QUALITATIVE    EKG EKG Interpretation Date/Time:  Wednesday March 26 2023 08:00:16 EST Ventricular Rate:  79 PR Interval:  154 QRS Duration:  81 QT Interval:  343 QTC Calculation: 394 R Axis:   68  Text Interpretation: Sinus rhythm Consider left atrial enlargement Low voltage, precordial leads Borderline T abnormalities, anterior leads No significant  change since last tracing Confirmed by Elayne Snare ([id number]) on 03/26/2023 8:09:38 AM  Radiology DG Chest Port 1 View  Result Date: 03/26/2023 CLINICAL DATA:  Left-sided chest pain and shortness of breath EXAM: PORTABLE CHEST 1 VIEW COMPARISON:  Chest radiograph dated 06/18/2022 FINDINGS: Patient is rotated to the right. Normal lung volumes. Left basilar patchy opacity. Small focus of patchy opacity projecting over the right upper lung. No pleural effusion or pneumothorax. The heart size and mediastinal contours are within normal limits. No acute osseous abnormality. IMPRESSION: Left basilar patchy opacity and small focus of patchy opacity projecting over the right upper lung, likely atelectasis. Aspiration or pneumonia can be considered in the appropriate clinical setting. Electronically Signed   By: Agustin Cree M.D.   On: 03/26/2023 09:26    Procedures Procedures    Medications Ordered in ED Medications  lidocaine (LIDODERM) 5 % 1-3 patch (1 patch Transdermal Patch Applied 03/26/23 0830)  azithromycin (ZITHROMAX) tablet 500 mg (has no administration in time range)  ipratropium-albuterol (DUONEB) 0.5-2.5 (3) MG/3ML nebulizer solution 3 mL (3 mLs Nebulization Given 03/26/23 0957)  acetaminophen (TYLENOL) tablet 1,000 mg (1,000 mg Oral Given 03/26/23 0913)    ED Course/ Medical Decision Making/ A&P Clinical Course as of 03/26/23 1018  Wed Mar 26, 2023  0945 Labs within normal range. ?Opacity on CXR. With history of COPD will be treated  with antibiotics.  [VK]    Clinical Course User Index [VK] Rexford Maus, DO                                 Medical Decision Making This patient presents to the ED with chief complaint(s) of shortness of breath with pertinent past medical history of asthma, COPD, bipolar disorder which further complicates the presenting complaint. The complaint involves an extensive differential diagnosis and also carries with it a high risk of complications and morbidity.    The differential diagnosis includes asthma/COPD exacerbation, pneumonia, pneumothorax, pulmonary edema, pleural effusion, atypical ACS  Additional history obtained: Additional history obtained from N/A Records reviewed outpatient pulmonary records  ED Course and Reassessment: On patient's arrival to the emergency department she is hemodynamically stable in no acute distress, does have expiratory wheeze on exam consistent with asthma/COPD exacerbation.  She will be started on DuoNebs.  Patient does have a documented allergy to prednisone and states that she has not had any steroids since her allergic reaction and would like to avoid steroids at this time.  EKG on arrival showed normal sinus rhythm without acute ischemic changes.  Patient will have chest x-ray and viral swab performed to evaluate for cause of her exacerbation and she will be closely reassessed.  Independent labs interpretation:  The following labs were independently interpreted: within normal range  Independent visualization of imaging: - I independently visualized the following imaging with scope of interpretation limited to determining acute life threatening conditions related to emergency care: CXR, which revealed RUL infiltrate vs atelectasis  Consultation: - Consulted or discussed management/test interpretation w/ external professional: N/A  Consideration for admission or further workup: Patient has no emergent conditions requiring admission or further  work-up at this time and is stable for discharge home with primary care follow-up  Social Determinants of health: N/A    Amount and/or Complexity of Data Reviewed Labs: ordered. Radiology: ordered.  Risk OTC drugs. Prescription drug management.          Final Clinical Impression(s) /  ED Diagnoses Final diagnoses:  COPD exacerbation (HCC)    Rx / DC Orders ED Discharge Orders          Ordered    azithromycin (ZITHROMAX) 250 MG tablet  Daily        03/26/23 1016    Tiotropium Bromide Monohydrate (SPIRIVA RESPIMAT) 2.5 MCG/ACT AERS  Daily        03/26/23 1016    albuterol (VENTOLIN HFA) 108 (90 Base) MCG/ACT inhaler  Every 6 hours PRN        03/26/23 1016    fluticasone-salmeterol (ADVAIR HFA) 230-21 MCG/ACT inhaler  2 times daily        03/26/23 1016              [place], [place] K, DO 03/26/23 1018

## 2023-04-02 ENCOUNTER — Other Ambulatory Visit (HOSPITAL_COMMUNITY): Payer: Self-pay | Admitting: Psychiatry

## 2023-04-30 DIAGNOSIS — N3281 Overactive bladder: Secondary | ICD-10-CM | POA: Insufficient documentation

## 2023-05-02 ENCOUNTER — Telehealth: Payer: Self-pay | Admitting: Internal Medicine

## 2023-05-02 DIAGNOSIS — J4489 Other specified chronic obstructive pulmonary disease: Secondary | ICD-10-CM

## 2023-05-02 MED ORDER — FLUTICASONE-SALMETEROL 230-21 MCG/ACT IN AERO: 2.0000 | INHALATION_SPRAY | Freq: Two times a day (BID) | RESPIRATORY_TRACT | 11 refills | Status: DC

## 2023-05-02 NOTE — Telephone Encounter (Signed)
Advair refilled.

## 2023-05-15 ENCOUNTER — Other Ambulatory Visit: Payer: Self-pay | Admitting: Internal Medicine

## 2023-05-15 DIAGNOSIS — J4489 Other specified chronic obstructive pulmonary disease: Secondary | ICD-10-CM

## 2023-05-16 ENCOUNTER — Telehealth: Payer: Self-pay | Admitting: Adult Health

## 2023-05-16 NOTE — Telephone Encounter (Signed)
Pharmacy comment: Alternative Requested:PA IS NEEDED.

## 2023-05-16 NOTE — Telephone Encounter (Signed)
After clinic call : needed prior authorization on Advair from pharmacy.  MAR/CARE Everywhere  review shows patient is taking Symbicort. Advised to remain on Symbicort as it is the same type of medication as Advair .   Discuss at follow up with Dr. Celine Mans.  Sent to Dr. Celine Mans for [facility]

## 2023-05-19 ENCOUNTER — Other Ambulatory Visit (HOSPITAL_COMMUNITY): Payer: Self-pay

## 2023-05-19 ENCOUNTER — Telehealth: Payer: Self-pay

## 2023-05-19 NOTE — Telephone Encounter (Signed)
*  Pulm  Pharmacy Patient Advocate Encounter   Received notification from RX Request Messages that prior authorization for Advair HFA 230-21MCG/ACT aerosol  is required/requested.   Insurance verification completed.   The patient is insured through [facility] .   Per test claim: PA required; PA submitted to above mentioned insurance via CoverMyMeds Key/confirmation #/EOC [id number] Status is pending

## 2023-05-19 NOTE — Telephone Encounter (Signed)
PA request has been Submitted. New Encounter created for follow up. For additional info see Pharmacy Prior Auth telephone encounter from 12/30.

## 2023-05-20 NOTE — Telephone Encounter (Signed)
 Pharmacy Patient Advocate Encounter  Received notification from [facility] that Prior Authorization for Advair HFA 230-21MCG/ACT aerosol  has been APPROVED from 05/19/2023 to 05/18/2024

## 2023-05-21 NOTE — Progress Notes (Signed)
 [facility] Video Visit   Patient ID:  Dezaria FletcherCox Profitt is a 43 y.o. 06/22/1980 female. Place of service: patient home Patient has been advised as to the limitations and limited nature of physical exam due to nature of a video visit, the possibility of privacy risk in the use of a video visit, and that the healthcare provider may recommend visiting a healthcare clinic for in-person care and follow up.  Video Visit Assessment and Plan   1. Essential hypertension, benign (Primary) 2. Chronic obstructive pulmonary disease with acute exacerbation (*) 3. Cigarette nicotine  dependence with nicotine -induced disorder   1.  Alternate Tylenol  and Motrin  every 4-6 hours as needed 2.  Take over-the-counter allergy medicine such as Allegra  or Claritin  daily until your symptoms resolve 3.  Saline nasal spray 3-4 times daily for relief of nasal congestion 4.  Astepro  nasal spray for relief of nasal congestion  5.  Salt water gargles as needed for sore throat 6.  Offered Tessalon  Perles as needed for cough during the day.  Patient declined.  States they do not help her.  Advised patient to take Mucinex  for cough during the day.  Promethazine  DM as needed for cough at night 7.  Call 911 or go to the ER for any shortness of breath, chest pain, or any other concerning symptoms  Follow-up for any persistent or worsening symptoms  Albuterol  inhaler as needed  Test for COVID and notify me if positive  Discussed limitations of virtual visit and that I cannot listen the patient's lungs.  Low threshold to follow-up in person for any persistent or worsening symptoms  Discussed viral versus bacterial infections in detail with patient.  Also discussed antibiotic stewardship  Discussed differential diagnoses, red flag symptoms, when to seek emergency care.  Patient verbalizes understanding.   Patient's Medications    Patient's Medications       * Accurate as of May 21, 2023  6:27 PM.  Reflects encounter med changes as of last refresh          Continued Medications      Instructions  * albuterol  0.63 mg/3 mL nebulizer solution Commonly known as: ACCUNEB   0.63 mg, Nebulization, Every 6 hours as needed   * albuterol  sulfate HFA 108 (90 Base) MCG/ACT inhaler Commonly known as: PROVENTIL ,VENTOLIN ,PROAIR   2 puffs, Inhalation, Every 6 hours as needed   busPIRone  10 mg tablet Commonly known as: BUSPAR   10 mg, Oral, 3 times daily   citalopram hydrobromide 20 mg tablet Commonly known as: CELEXA  20 mg, Oral, At bedtime   dicyclomine  10 mg capsule Commonly known as: BENTYL   10 mg, Oral, 30 minutes before meals & at bedtime   famotidine  40 mg tablet Commonly known as: PEPCID   40 mg, Oral, Daily   fluticasone -salmeterol 230-21 MCG/ACT inhaler Commonly known as: ADVAIR  HFA  2 puffs, Inhalation, 2 times a day   hydrOXYzine  HCl 25 mg tablet Commonly known as: ATARAX   TAKE 1 TABLET (25 MG TOTAL) BY MOUTH EVERY 4 (FOUR) HOURS AS NEEDED FOR ITCHING.   hydrOXYzine  pamoate 25 mg capsule Commonly known as: VISTARIL   25 mg, Oral, 3 times a day as needed   loratadine  10 MG tablet Commonly known as: CLARITIN   10 mg, Daily   losartan  potassium 50 mg tablet Commonly known as: COZAAR   50 mg, Oral, 2 times a day   meloxicam 7.5 mg tablet Commonly known as: MOBIC  7.5 mg, Oral, Daily   montelukast  10 MG tablet Commonly known as:  SINGULAIR   10 mg, Oral, Daily   naltrexone  50 mg tablet Commonly known as: REVIA   50 mg, Oral, Daily   OLANZapine  5 mg tablet Commonly known as: ZYPREXA   5 mg, Oral, Daily   ondansetron  4 mg tablet Commonly known as: ZOFRAN   4 mg, Oral, Every 8 hours as needed   oxazepam 10 MG capsule Commonly known as: SERAX  Oral, 2 times a day as needed   OXcarbazepine 150 mg tablet Commonly known as: TRILEPTAL  150 mg, Oral, At bedtime   oxybutynin 10 MG 24 hr tablet Commonly known as: DITROPAN XL  10 mg, Oral, Daily    pantoprazole  sodium 40 mg tablet Commonly known as: PROTONIX   40 mg, Oral, Daily   senna 8.6 mg Tabs Commonly known as: SENOKOT,SENNA  8.6 mg, Oral, At bedtime   SPIRIVA  RESPIMAT 2.5 MCG/ACT inhaler Generic drug: tiotropium bromide   1 puff, Inhalation, 2 times a day   SYMBICORT  160-4.5 MCG/ACT inhaler Generic drug: budesonide -formoterol   SMARTSIG:Via Inhaler      * * This list has 2 medication(s) that are the same as other medications prescribed for you. Read the directions carefully, and ask your doctor or other care provider to review them with you.              Risk, benefits, and alternatives were provided through patient instructions given to the patient electronically and during the video interaction.  If any worsening symptoms or lack of improvement, the patient will seek immediate medical care.  Video Visit History   HPI Chief Complaint  Patient presents with  . URI    Symptoms began today, productive cough, sore throat, headaches, some nasal congestion, wheezing, no SOB, no fever, taking ibuprofen  and Mucinex , no Covid testing   Samella is a 43 year old female with hypertension, COPD, and nicotine  dependence, who presents today with complaint of cough, sore throat, nasal congestion, and intermittent wheezing that began this morning.  States her daughter was recently diagnosed with pneumonia and started on azithromycin .  Patient feels she may need azithromycin  as well.  Had no symptoms prior to today.  Denies any fever, shortness of breath, or chest pain.  Denies any risk of pregnancy   Reviewed and updated this visit by provider: Tobacco  Allergies  Meds  Problems  Med Hx  Surg Hx  Fam Hx        ROS: As documented in the history above, all other relevant system complaints were negative.  Video Visit Objective Findings  Examination conducted with the use of video cameras/computer monitors. Vital signs and other aspects of physical exam are limited  due to the nature of this encounter.   Constitutional: No apparent acute distress noted during the video interaction; Alert and oriented with normal mentation and verbally interactive. Mood: Appears appropriate to situation. Audible nasal congestion  No cough or difficulty breathing noted during exam  No LOS data to display   *Some images could not be shown.

## 2023-06-07 ENCOUNTER — Encounter: Payer: Self-pay | Admitting: Internal Medicine

## 2023-06-17 DIAGNOSIS — M545 Low back pain, unspecified: Secondary | ICD-10-CM | POA: Insufficient documentation

## 2023-06-17 DIAGNOSIS — M47816 Spondylosis without myelopathy or radiculopathy, lumbar region: Secondary | ICD-10-CM | POA: Insufficient documentation

## 2023-06-17 DIAGNOSIS — M461 Sacroiliitis, not elsewhere classified: Secondary | ICD-10-CM | POA: Insufficient documentation

## 2023-06-23 ENCOUNTER — Encounter: Payer: Self-pay | Admitting: Internal Medicine

## 2023-06-24 NOTE — Progress Notes (Signed)
 Date of service: 06/24/2023  Surgeon: Rockey JONELLE Pae, MD  PREOPERATIVE DIAGNOSIS: Sacroiliitis, chronic; lumbago without radiculopathy  POSTOPERATIVE DIAGNOSIS: Sacroiliitis, chronic; lumbago without radiculopathy  PROCEDURE: SACROILIAC JOINT INJECTION WITH FLUOROSCOPY bilateral   THERAPEUTIC AGENT:  1.5cc 0.25% bupivacaine  and 40mg  kenalog  per side  ANESTHESIA: Local  BLOOD LOSS: minimal  COMPLICATIONS: none  DESCRIPTION: After obtaining informed consent, the patient was positioned on the padded fluoroscopy table in the prone position. The skin overlying the sacroiliac joint was prepped with antiseptic solution and draped in the usual fashion. Fluoroscopy was used to identify the site for needle insertion. Local anesthesia was administered at this site. A sterile block needle was advanced through the anesthetized skin and manipulated into the sacroiliac joint under fluoroscopic observation. Aspiration was negative. Isovue  contrast dye was injected to show spread within the joint space and lack of vascular uptake.  The therapeutic agent was administered in increments. The needle was removed intact and discarded.   The patient tolerated the procedure well and recovered uneventfully in the pain center recovery room. The procedure was performed as stated above. Discharge instructions were given.   ADDENDUM: bilateral performed Fluoroscopy time and Exposure, mRad were recorded and scanned into chart for procedure. Image count stored.

## 2023-06-25 ENCOUNTER — Other Ambulatory Visit: Payer: Self-pay | Admitting: Internal Medicine

## 2023-06-26 ENCOUNTER — Ambulatory Visit: Payer: Medicaid Other | Admitting: Nurse Practitioner

## 2023-06-26 ENCOUNTER — Ambulatory Visit (INDEPENDENT_AMBULATORY_CARE_PROVIDER_SITE_OTHER): Payer: Medicaid Other

## 2023-06-26 ENCOUNTER — Encounter: Payer: Self-pay | Admitting: Nurse Practitioner

## 2023-06-26 VITALS — BP 126/80 | HR 104 | Temp 98.2°F

## 2023-06-26 DIAGNOSIS — J309 Allergic rhinitis, unspecified: Secondary | ICD-10-CM | POA: Insufficient documentation

## 2023-06-26 DIAGNOSIS — J441 Chronic obstructive pulmonary disease with (acute) exacerbation: Secondary | ICD-10-CM | POA: Diagnosis not present

## 2023-06-26 DIAGNOSIS — K219 Gastro-esophageal reflux disease without esophagitis: Secondary | ICD-10-CM

## 2023-06-26 DIAGNOSIS — J4489 Other specified chronic obstructive pulmonary disease: Secondary | ICD-10-CM

## 2023-06-26 DIAGNOSIS — F17210 Nicotine dependence, cigarettes, uncomplicated: Secondary | ICD-10-CM

## 2023-06-26 DIAGNOSIS — J4551 Severe persistent asthma with (acute) exacerbation: Secondary | ICD-10-CM

## 2023-06-26 MED ORDER — LORATADINE 10 MG PO TABS: 10.0000 mg | ORAL_TABLET | Freq: Every day | ORAL | 2 refills | Status: DC

## 2023-06-26 MED ORDER — METHYLPREDNISOLONE 4 MG PO TBPK: ORAL_TABLET | ORAL | 0 refills | Status: DC

## 2023-06-26 MED ORDER — SPIRIVA RESPIMAT 2.5 MCG/ACT IN AERS
2.0000 | INHALATION_SPRAY | Freq: Every day | RESPIRATORY_TRACT | 11 refills | Status: DC
Start: 2023-06-26 — End: 2023-08-19

## 2023-06-26 MED ORDER — NICOTINE 21 MG/24HR TD PT24
21.0000 mg | MEDICATED_PATCH | Freq: Every day | TRANSDERMAL | 2 refills | Status: DC
Start: 2023-06-26 — End: 2023-12-18

## 2023-06-26 NOTE — Progress Notes (Signed)
 @Patient  ID: [name], female    DOB: [birth year], 43 y.o.   MRN: [id number]  Chief Complaint  Patient presents with   Acute Visit    Increased SOB, wheezing over the past 3 wks. She has a cough, minimal clear sputum. She is using her albuterol  inhaler about 6 times per day and neb several times per wk without relief.     Referring provider: Suanne Pfeiffer, NP  HPI: 43 year old female, active smoker followed for COPD/asthma overlap. She is a patient of Dr. Correne and last seen 01/14/2023. Past medical history significant for HTN, depression with anxiety, ETOH abuse.  TEST/EVENTS:  11/16/2015 PFT: FVC 94, FEV1 62, ratio 55, TLC 160, DLCounc 95 05/24/2022 CTA chest: No evidence of PE; although there was significant breathing motion so exam was limited.  Trace pericardial effusion.  No LAD.  Subtle right lower lobe dependent opacity.  Mild scarring and atelectatic changes elsewhere along the lung bases.  Atrophy of the right kidney.  03/07/2022: OV with Dr. Meade for pre-operative evaluation for right foot surgery. Had surgery in April at outpatient surgical center and was hospitalized for flare of COPD for three days. Ended up going home without oxygen. Still smoking; cut pack to less than 1/2 ppd. Continued on Symbicort . Added spiriva . Recommended extubation to BiPAP and smoking cessation.   06/07/2022: SHERLEAN with Torrey Horseman NP for hospital follow-up.  She was admitted 05/15/2022 to 05/18/2022 for acute respiratory failure secondary to influenza A and asthma exacerbation.  She was treated with Tamiflu  and steroids and clinically improved.  She then went back to the emergency department on 1/5 with increased shortness of breath after completing steroid course.  She also reported a productive cough with green to brown sputum.  She was treated for COPD/asthma exacerbation with steroids antibiotics and bronchodilators.  She was successfully weaned off oxygen and was able to ambulate without dyspnea or  hypoxia on day of discharge.  She was given a 6-day steroid taper and 3 additional days of p.o. azithromycin . Today, she tells me that she is feeling some better but she is still more short winded than she usually is. She also continues to have a productive cough with yellow to green sputum. She has noticed more wheezing since completing her steroid course. Does have some baseline nasal congestion, which is unchanged. Denies fevers, chills, hemoptysis, leg swelling, orthopnea. She is on symbicort  and spiriva . Takes singulair  at bedtime. She is still smoking around 10-15 cigarettes a day.   08/07/2022: OV with Dr. Meade.  Poorly controlled asthma with COPD overlap.  Hospitalized April 2023, November 2020.  In January 2024 for COPD exacerbation.  Last hospitalization for hypercapnic respiratory failure secondary to prior exposure.  Discharge on home oxygen 2 L/min.  Ambulatory walk test without desaturation.  Does not need to continue wearing O2. HST ordered given difficulties with sleep. Smoking cessation - nicotine  patches.   01/14/2023: Virtual visit with Dr. Meade. Follow up. Sleep study too expensive. Hasn't needed any prednisone  since last visit. Smoking 5-6 cigarettes/day. Albuterol  use once before bed. PPI controlling GERD. Continue spiriva  and Advair .   06/26/2023: Today - acute Discussed the use of AI scribe software for clinical note transcription with the patient, who gave verbal consent to proceed.  History of Present Illness   [name] is a 43 year old female with asthma and COPD who presents with increased wheezing and shortness of breath.  She has experienced increased wheezing and shortness of breath over  the past couple of weeks, particularly noticeable when lying down at night. She experiences coughing with clear sputum and describes the sensation as 'something's like sitting on my chest' and tightness. No fevers, chills, or hemoptysis. No low oxygen levels at home. No sinus  symptoms. No leg swelling or orthopnea.   She did have an exacerbation in November which was treated with z pack. No steroids. She uses her albuterol  inhaler six or more times a day. She is currently on Advair  and was on Spiriva  until she ran out a couple of days ago. She has a nebulizer at home and has used it a few times this week. She feels her asthma is not well controlled, stating 'the inhalers just aren't doing anything now.'  She has decreased her smoking. Down to 1-2 cigarettes a day. Using nicotine  patches 21 mcg. Feels like they could be stronger. She has gained weight since trying to quit smoking, from 180 lbs to 190 lbs today.     Unable to complete FeNO  Allergies  Allergen Reactions   Alprazolam Itching   Oxycodone  Itching   Penicillins Nausea And Vomiting and Other (See Comments)    Has patient had a PCN reaction causing immediate rash, facial/tongue/throat swelling, SOB or lightheadedness with hypotension:No Has patient had a PCN reaction causing severe rash involving mucus membranes or skin necrosis:No Has patient had a PCN reaction that REACTION THAT REQUIRED HOSPITALIZATION:  #  #  #  YES  #  #  #  Has patient had a PCN reaction occurring within the last 10 years: #  #  #  YES  #  #  #    Norvasc  [Amlodipine  Besylate] Other (See Comments)    Tip of tongue became numb and caused flushing   Prednisone  Other (See Comments) and Swelling    FACIAL swelling- breathing not affected, though    Immunization History  Administered Date(s) Administered   Influenza Inj Mdck Quad Pf 04/04/2021, 02/19/2022   Influenza, Mdck, Trivalent,PF 6+ MOS(egg free) 04/30/2023   Influenza,inj,Quad PF,6+ Mos 07/29/2014, 06/07/2015, 07/21/2017, 02/05/2018, 03/03/2019, 05/25/2022   Influenza-Unspecified 03/24/2012, 07/29/2014, 06/07/2015, 07/21/2017, 02/05/2018, 03/03/2019   PNEUMOCOCCAL CONJUGATE-20 01/04/2023   Pneumococcal Polysaccharide-23 07/29/2014, 06/08/2015   Pneumococcal-Unspecified  07/29/2014, 06/08/2015   Tdap 06/07/2015    Past Medical History:  Diagnosis Date   Anxiety    Asthma    Depression    doing good, not on meds now   GERD (gastroesophageal reflux disease)    Headache    migraines   History of kidney stones    Hypertension    Kidney stones    Missed abortion 10/02/2013   Ovarian cyst    Pregnancy induced hypertension    Suicidal intent    Vaginal Pap smear, abnormal    bx, f/u ok    Tobacco History: Social History   Tobacco Use  Smoking Status Every Day   Current packs/day: 1.00   Average packs/day: 1 pack/day for 15.0 years (15.0 ttl pk-yrs)   Types: Cigarettes  Smokeless Tobacco Never  Tobacco Comments   1-2 cigs per day 06/26/23 Sonny Lecher, CMA   Ready to quit: Not Answered Counseling given: Not Answered Tobacco comments: 1-2 cigs per day 06/26/23 Sonny Lecher, CMA   Outpatient Medications Prior to Visit  Medication Sig Dispense Refill   albuterol  (VENTOLIN  HFA) 108 (90 Base) MCG/ACT inhaler Inhale 2 puffs into the lungs every 6 (six) hours as needed for wheezing or shortness of breath. 18 each 5  buPROPion  (WELLBUTRIN  XL) 150 MG 24 hr tablet Take 150 mg by mouth daily.     busPIRone  (BUSPAR ) 10 MG tablet Take 10 mg by mouth 3 (three) times daily.     dicyclomine  (BENTYL ) 10 MG capsule Take 10 mg by mouth 3 (three) times daily before meals.     famotidine  (PEPCID ) 40 MG tablet Take 1 tablet (40 mg total) by mouth at bedtime. 30 tablet 1   fluticasone -salmeterol (ADVAIR  HFA) 230-21 MCG/ACT inhaler Inhale 2 puffs into the lungs 2 (two) times daily. 12 g 11   hydrOXYzine  (VISTARIL ) 25 MG capsule Take 1 capsule (25 mg total) by mouth 3 (three) times daily as needed for anxiety. 30 capsule 0   losartan  (COZAAR ) 50 MG tablet Take 50 mg by mouth daily.     methylphenidate  27 MG PO CR tablet Take 27 mg by mouth in the morning.     ondansetron  (ZOFRAN -ODT) 4 MG disintegrating tablet Take 1 tablet (4 mg total) by mouth every 8 (eight)  hours as needed for nausea or vomiting. 20 tablet 0   pantoprazole  (PROTONIX ) 40 MG tablet TAKE 1 TABLET BY MOUTH EVERY DAY (Patient taking differently: Take 40 mg by mouth daily before breakfast.) 30 tablet 0   solifenacin (VESICARE) 5 MG tablet Take 5 mg by mouth daily.     venlafaxine  XR (EFFEXOR -XR) 75 MG 24 hr capsule Take 1 tablet by mouth daily.     loratadine  (CLARITIN ) 10 MG tablet Take 1 tablet (10 mg total) by mouth daily. 30 tablet 2   nicotine  (NICODERM CQ  - DOSED IN MG/24 HOURS) 21 mg/24hr patch Place 1 patch (21 mg total) onto the skin daily. 28 patch 3   Tiotropium Bromide  Monohydrate (SPIRIVA  RESPIMAT) 2.5 MCG/ACT AERS Inhale 2 puffs into the lungs daily. 1 each 5   traZODone  (DESYREL ) 100 MG tablet Take 2 tablets (200 mg total) by mouth at bedtime as needed for sleep. 30 tablet 0   fluticasone -salmeterol (ADVAIR  HFA) 230-21 MCG/ACT inhaler Inhale 2 puffs into the lungs 2 (two) times daily. 1 each 12   No facility-administered medications prior to visit.     Review of Systems:   Constitutional: No weight loss or gain, night sweats, fevers, chills, fatigue, or lassitude. HEENT: No headaches, difficulty swallowing, tooth/dental problems, or sore throat. No sneezing, itching, ear ache, nasal congestion, post nasal drip CV:  No chest pain, orthopnea, PND, swelling in lower extremities, anasarca, dizziness, palpitations, syncope Resp: +shortness of breath with exertion; productive cough; wheezing; chest tightness.  No hemoptysis.   No chest wall deformity GI:  No heartburn, indigestion, abdominal pain, nausea, vomiting, diarrhea, change in bowel habits, loss of appetite GU: No dysuria, change in color of urine, urgency or frequency.   Skin: No rash, lesions, ulcerations MSK:  No joint pain or swelling.   Neuro: No dizziness or lightheadedness.  Psych: +stable depression, anxiety. No SI/HI. Mood stable.     Physical Exam:  BP 126/80 (BP Location: Right Arm, Cuff Size: Large)    Pulse (!) 104   Temp 98.2 F (36.8 C) (Oral)   SpO2 98%   GEN: Pleasant, interactive, well-appearing; obese; in no acute distress. HEENT:  Normocephalic and atraumatic. PERRLA. Sclera white. Nasal turbinates pink, moist and patent bilaterally. No rhinorrhea present. Oropharynx pink and moist, without exudate or edema. No lesions, ulcerations, or postnasal drip.  NECK:  Supple w/ fair ROM. No JVD present. Normal carotid impulses w/o bruits. Thyroid  symmetrical with no goiter or nodules palpated. No lymphadenopathy.  CV: RRR, no m/r/g, no peripheral edema. Pulses intact, +2 bilaterally. No cyanosis, pallor or clubbing. PULMONARY:  Unlabored, regular breathing. Scattered wheezes bilaterally A&P. No accessory muscle use.  GI: BS present and normoactive. Soft, non-tender to palpation. No organomegaly or masses detected.  MSK: No erythema, warmth or tenderness. Cap refil <2 sec all extrem. No deformities or joint swelling noted.  Neuro: A/Ox3. No focal deficits noted.   Skin: Warm, no lesions or rashe Psych: Normal affect and behavior. Judgement and thought content appropriate.     Lab Results:  CBC    Component Value Date/Time   WBC 9.2 03/26/2023 0844   RBC 4.52 03/26/2023 0844   HGB 13.4 03/26/2023 0844   HGB 16.1 (H) 08/19/2019 1426   HCT 41.3 03/26/2023 0844   HCT 48.6 (H) 08/19/2019 1426   PLT 338 03/26/2023 0844   PLT 347 08/19/2019 1426   MCV 91.4 03/26/2023 0844   MCV 90 08/19/2019 1426   MCH 29.6 03/26/2023 0844   MCHC 32.4 03/26/2023 0844   RDW 16.0 (H) 03/26/2023 0844   RDW 13.3 08/19/2019 1426   LYMPHSABS 1.3 03/26/2023 0844   MONOABS 0.7 03/26/2023 0844   EOSABS 0.4 03/26/2023 0844   BASOSABS 0.1 03/26/2023 0844    BMET    Component Value Date/Time   NA 137 03/26/2023 0844   NA 138 03/13/2020 1115   K 4.0 03/26/2023 0844   CL 106 03/26/2023 0844   CO2 23 03/26/2023 0844   GLUCOSE 103 (H) 03/26/2023 0844   BUN <5 (L) 03/26/2023 0844   BUN 9 03/13/2020  1115   CREATININE 0.96 03/26/2023 0844   CREATININE 1.09 10/31/2015 1506   CALCIUM  9.0 03/26/2023 0844   GFRNONAA >60 03/26/2023 0844   GFRNONAA 67 10/31/2015 1506   GFRAA 96 03/13/2020 1115   GFRAA 78 10/31/2015 1506    BNP No results found for: BNP   Imaging:  DG Chest 2 View Result Date: 06/26/2023 CLINICAL DATA:  Cough and shortness of breath. EXAM: CHEST - 2 VIEW COMPARISON:  Chest radiographs 03/26/2023, 06/18/2022, 05/24/2022; CT chest 05/24/2022 FINDINGS: Cardiac silhouette and mediastinal contours are within normal limits. The lungs are clear. Findings diaphragms and mild hyperinflation, unchanged from prior. No pleural effusion pneumothorax. Mild-to-moderate multilevel degenerative disc changes of the mid to upper thoracic spine. IMPRESSION: 1. No active cardiopulmonary disease. 2. Mild hyperinflation, unchanged from prior. Electronically Signed   By: Tanda Lyons M.D.   On: 06/26/2023 10:15    Administration History     None          Latest Ref Rng & Units 11/16/2015   12:47 PM  PFT Results  FVC-Pre L 3.54   FVC-Predicted Pre % 94   Pre FEV1/FVC % % 55   FEV1-Pre L 1.96   FEV1-Predicted Pre % 62   DLCO uncorrected ml/min/mmHg 23.29   DLCO UNC% % 95   DLVA Predicted % 97   TLC L 8.11   TLC % Predicted % 160   RV % Predicted % 292     Lab Results  Component Value Date   NITRICOXIDE 7 10/18/2015        Assessment & Plan:   Severe persistent asthma with acute exacerbation Asthma/COPD overlap with acute exacerbation. Last exacerbation requiring steroids 05/2022. She was treated for mild flare in November 2024 with z pack; no steroids. She has mild bronchospasm on exam today. Unable to complete FeNO. Allergy listed to prednisone  - facial swelling. She does tolerate depomedrol. Will  treat her with medrol  dosepak. Hold off on antimicrobial therapy as not currently having infections symptoms. Will check CXR today. Advised to restart Spiriva  - rx updated.  Optimize bronchodilator regimen. Action plan in place. She feels poorly controlled. She has peripheral eosinophilia up to 400 on most recent testing 03/2023). She may be a good candidate for biologic therapy - will discuss with Dr. Meade. Side effect profile reviewed with pt.   Patient Instructions  Continue Albuterol  inhaler 2 puffs or 3 mL neb every 6 hours as needed for shortness of breath or wheezing. Notify if symptoms persist despite rescue inhaler/neb use. Use your nebulizer 2-3 times a day until symptoms improve Restart spiriva  2 puffs daily Continue Advair  2 puffs Twice daily. Brush tongue and rinse mouth afterwards Continue pantoprazole  1 tab daily Continue claritin  daily    Medrol  dosepak - take as prescribed. Take in AM with food. Start tomorrow. If you develop any facial/tongue swelling, stop and seek emergency care. Sometimes steroids do cause some fluid retention/swelling but shouldn't have this in the tongue Delsym  2 tsp Twice daily as needed for cough   Chest x ray today   I will talk to to Dr. Meade about injectable asthma medications. We may consider Dupixent  injections for you.    Follow up in 2 weeks with Dr. Meade (1sT) or Katie Drue Camera,NP. If symptoms do not improve or worsen, please contact office for sooner follow up or seek emergency care.    Asthma exacerbation in COPD (HCC) See above. VS stable on room air.   Allergic rhinitis On singulair  in the past. Unclear when this was stopped; however, she has had difficulties with depression and SI over the past year. Would not recommend restarting at this time due to risks of worsening mood. Continue current regimen with claritin  and PRN flonase .   GERD (gastroesophageal reflux disease) Well controlled per her report. Continue PPI and famotidine  for breakthrough   I spent 35 minutes of dedicated to the care of this patient on the date of this encounter to include pre-visit review of records, face-to-face time with the  patient discussing conditions above, post visit ordering of testing, clinical documentation with the electronic health record, making appropriate referrals as documented, and communicating necessary findings to members of the patients care team.  Comer LULLA Rouleau, NP 06/26/2023  Pt aware and understands NP's role.

## 2023-06-26 NOTE — Patient Instructions (Addendum)
 Continue Albuterol  inhaler 2 puffs or 3 mL neb every 6 hours as needed for shortness of breath or wheezing. Notify if symptoms persist despite rescue inhaler/neb use. Use your nebulizer 2-3 times a day until symptoms improve Restart spiriva  2 puffs daily Continue Advair  2 puffs Twice daily. Brush tongue and rinse mouth afterwards Continue pantoprazole  1 tab daily Continue claritin  daily    Medrol  dosepak - take as prescribed. Take in AM with food. Start tomorrow. If you develop any facial/tongue swelling, stop and seek emergency care. Sometimes steroids do cause some fluid retention/swelling but shouldn't have this in the tongue Delsym  2 tsp Twice daily as needed for cough   Chest x ray today   I will talk to to Dr. Meade about injectable asthma medications. We may consider Dupixent  injections for you.    Follow up in 2 weeks with Dr. Meade (1sT) or Katie Moiz Ryant,NP. If symptoms do not improve or worsen, please contact office for sooner follow up or seek emergency care.

## 2023-06-26 NOTE — Assessment & Plan Note (Signed)
 Asthma/COPD overlap with acute exacerbation. Last exacerbation requiring steroids 05/2022. She was treated for mild flare in November 2024 with z pack; no steroids. She has mild bronchospasm on exam today. Unable to complete FeNO. Allergy listed to prednisone  - facial swelling. She does tolerate depomedrol. Will treat her with medrol  dosepak. Hold off on antimicrobial therapy as not currently having infections symptoms. Will check CXR today. Advised to restart Spiriva  - rx updated. Optimize bronchodilator regimen. Action plan in place. She feels poorly controlled. She has peripheral eosinophilia up to 400 on most recent testing 03/2023). She may be a good candidate for biologic therapy - will discuss with Dr. Meade. Side effect profile reviewed with pt.   Patient Instructions  Continue Albuterol  inhaler 2 puffs or 3 mL neb every 6 hours as needed for shortness of breath or wheezing. Notify if symptoms persist despite rescue inhaler/neb use. Use your nebulizer 2-3 times a day until symptoms improve Restart spiriva  2 puffs daily Continue Advair  2 puffs Twice daily. Brush tongue and rinse mouth afterwards Continue pantoprazole  1 tab daily Continue claritin  daily    Medrol  dosepak - take as prescribed. Take in AM with food. Start tomorrow. If you develop any facial/tongue swelling, stop and seek emergency care. Sometimes steroids do cause some fluid retention/swelling but shouldn't have this in the tongue Delsym  2 tsp Twice daily as needed for cough   Chest x ray today   I will talk to to Dr. Meade about injectable asthma medications. We may consider Dupixent  injections for you.    Follow up in 2 weeks with Dr. Meade (1sT) or Katie Ryane Canavan,NP. If symptoms do not improve or worsen, please contact office for sooner follow up or seek emergency care.

## 2023-06-26 NOTE — Assessment & Plan Note (Signed)
 On singulair  in the past. Unclear when this was stopped; however, she has had difficulties with depression and SI over the past year. Would not recommend restarting at this time due to risks of worsening mood. Continue current regimen with claritin  and PRN flonase .

## 2023-06-26 NOTE — Assessment & Plan Note (Signed)
 Well controlled per her report. Continue PPI and famotidine  for breakthrough

## 2023-06-26 NOTE — Assessment & Plan Note (Signed)
 See above. VS stable on room air.

## 2023-06-27 NOTE — Telephone Encounter (Signed)
 Pt sent mychart message requesting to know what hyperinflated is. Katie, can you please further explain this to pt.

## 2023-07-02 NOTE — Telephone Encounter (Signed)
Patient was given Dupixent paper work.

## 2023-07-11 ENCOUNTER — Telehealth: Payer: Self-pay | Admitting: Nurse Practitioner

## 2023-07-11 ENCOUNTER — Telehealth: Payer: Self-pay | Admitting: Pharmacist

## 2023-07-11 DIAGNOSIS — J4489 Other specified chronic obstructive pulmonary disease: Secondary | ICD-10-CM

## 2023-07-11 NOTE — Telephone Encounter (Signed)
Per Rhunette Croft, NP:  Severe asthma with COPD overlap. Eosinophils 400 03/2023. Plan to start Dupixent. Pt completed paperwork 07/02/2023. Thanks!     Submitted a Prior Authorization request to [facility] MEDICAID for DUPIXENT via CoverMyMeds. Will update once we receive a response.  Key: [id number]

## 2023-07-11 NOTE — Telephone Encounter (Signed)
Will start Dupixent benfeits investigation

## 2023-07-11 NOTE — Telephone Encounter (Signed)
Patient dropped off Dupixent paperwork. Put in Pharmacy box. Patient phone number is [phone].

## 2023-07-14 ENCOUNTER — Other Ambulatory Visit (HOSPITAL_COMMUNITY): Payer: Self-pay

## 2023-07-14 ENCOUNTER — Encounter: Payer: Self-pay | Admitting: Internal Medicine

## 2023-07-14 ENCOUNTER — Other Ambulatory Visit: Payer: Self-pay | Admitting: Internal Medicine

## 2023-07-14 NOTE — Telephone Encounter (Signed)
 Received notification from [facility] regarding a prior authorization for DUPIXENT. Authorization has been APPROVED from 07/11/2023 to 01/08/2024. Approval letter sent to scan center.  Per test claim, copay for 8mL as a 28 days supply is $4.00  Patient can fill through [facility]: [phone]   Authorization # [id number]   PAP paperwork received and sent to scan center for retention.

## 2023-07-15 ENCOUNTER — Encounter: Payer: Self-pay | Admitting: Internal Medicine

## 2023-07-15 ENCOUNTER — Telehealth: Payer: Self-pay | Admitting: Internal Medicine

## 2023-07-15 NOTE — Telephone Encounter (Signed)
 Patient needs refill fro nebulizer medication; albuterol and Pulmicort .  Pharmacy: [facility] on [address] [place]

## 2023-07-15 NOTE — Telephone Encounter (Signed)
 See message dated 07/15/23.

## 2023-07-16 NOTE — Telephone Encounter (Signed)
 See 07/14/23 [facility] message.

## 2023-07-16 NOTE — Telephone Encounter (Signed)
 Spoke to patient who is requesting refill on nebulized medication. Advised that there is not any Albuterol neb solution on her list so message will be routed to physician to address.

## 2023-07-18 ENCOUNTER — Ambulatory Visit: Payer: Medicaid Other | Admitting: Nurse Practitioner

## 2023-07-18 ENCOUNTER — Encounter: Payer: Self-pay | Admitting: Nurse Practitioner

## 2023-07-18 ENCOUNTER — Other Ambulatory Visit
Admission: RE | Admit: 2023-07-18 | Discharge: 2023-07-18 | Disposition: A | Discharge status: Home / Self Care | Payer: Medicaid Other | Source: Ambulatory Visit | Attending: Nurse Practitioner | Admitting: Nurse Practitioner

## 2023-07-18 VITALS — BP 132/88 | HR 89 | Ht 64.0 in | Wt 205.0 lb

## 2023-07-18 DIAGNOSIS — F17210 Nicotine dependence, cigarettes, uncomplicated: Secondary | ICD-10-CM

## 2023-07-18 DIAGNOSIS — J309 Allergic rhinitis, unspecified: Secondary | ICD-10-CM | POA: Insufficient documentation

## 2023-07-18 DIAGNOSIS — J4551 Severe persistent asthma with (acute) exacerbation: Secondary | ICD-10-CM

## 2023-07-18 DIAGNOSIS — R0601 Orthopnea: Secondary | ICD-10-CM | POA: Diagnosis not present

## 2023-07-18 DIAGNOSIS — J441 Chronic obstructive pulmonary disease with (acute) exacerbation: Secondary | ICD-10-CM | POA: Diagnosis not present

## 2023-07-18 DIAGNOSIS — J4489 Other specified chronic obstructive pulmonary disease: Secondary | ICD-10-CM

## 2023-07-18 MED ORDER — PREDNISONE 10 MG PO TABS: ORAL_TABLET | ORAL | 0 refills | Status: DC

## 2023-07-18 MED ORDER — ALBUTEROL SULFATE (2.5 MG/3ML) 0.083% IN NEBU: 2.5000 mg | INHALATION_SOLUTION | Freq: Four times a day (QID) | RESPIRATORY_TRACT | 12 refills | Status: AC | PRN

## 2023-07-18 MED ORDER — METHYLPREDNISOLONE ACETATE 80 MG/ML IJ SUSP
120.0000 mg | Freq: Once | INTRAMUSCULAR | Status: AC
  Administered 2023-07-18: 120 mg via INTRAMUSCULAR

## 2023-07-18 MED ORDER — DUPIXENT 300 MG/2ML ~~LOC~~ SOAJ
SUBCUTANEOUS | 0 refills | Status: DC
Start: 2023-07-18 — End: 2023-07-30
  Filled 2023-07-23: qty 8, 28d supply, fill #0

## 2023-07-18 MED ORDER — AZELASTINE-FLUTICASONE 137-50 MCG/ACT NA SUSP: 1.0000 | Freq: Two times a day (BID) | NASAL | 5 refills | Status: DC

## 2023-07-18 MED ORDER — ALBUTEROL SULFATE (2.5 MG/3ML) 0.083% IN NEBU
2.5000 mg | INHALATION_SOLUTION | Freq: Once | RESPIRATORY_TRACT | Status: AC
  Administered 2023-07-18: 2.5 mg via RESPIRATORY_TRACT

## 2023-07-18 NOTE — Progress Notes (Signed)
 @Patient  ID: [name], female    DOB: Mar 06, 1981, 43 y.o.   MRN: [id number]  Chief Complaint  Patient presents with   Asthma    Referring provider: Roe Rutherford, NP  HPI: 43 year old female, active smoker followed for COPD/asthma overlap. She is a patient of Dr. Humphrey Ibarra and last seen 06/26/2023 by [facility] NP. Past medical history significant for HTN, depression with anxiety, ETOH abuse.  TEST/EVENTS:  11/16/2015 PFT: FVC 94, FEV1 62, ratio 55, TLC 160, DLCounc 95 05/24/2022 CTA chest: No evidence of PE; although there was significant breathing motion so exam was limited.  Trace pericardial effusion.  No LAD.  Subtle right lower lobe dependent opacity.  Mild scarring and atelectatic changes elsewhere along the lung bases.  Atrophy of the right kidney. 03/2023 eos 400  06/26/2023 CXR: mild hyperinflation. No acute process  03/07/2022: OV with Dr. Celine Ibarra for pre-operative evaluation for right foot surgery. Had surgery in April at outpatient surgical center and was hospitalized for flare of COPD for three days. Ended up going home without oxygen. Still smoking; cut pack to less than 1/2 ppd. Continued on Symbicort. Added spiriva. Recommended extubation to BiPAP and smoking cessation.   06/07/2022: [name] with Melissa Mccarley NP for hospital follow-up.  She was admitted 05/15/2022 to 05/18/2022 for acute respiratory failure secondary to influenza A and asthma exacerbation.  She was treated with Tamiflu and steroids and clinically improved.  She then went back to the emergency department on 1/5 with increased shortness of breath after completing steroid course.  She also reported a productive cough with green to brown sputum.  She was treated for COPD/asthma exacerbation with steroids antibiotics and bronchodilators.  She was successfully weaned off oxygen and was able to ambulate without dyspnea or hypoxia on day of discharge.  She was given a 6-day steroid taper and 3 additional days of p.o.  azithromycin. Today, she tells me that she is feeling some better but she is still more short winded than she usually is. She also continues to have a productive cough with yellow to green sputum. She has noticed more wheezing since completing her steroid course. Does have some baseline nasal congestion, which is unchanged. Denies fevers, chills, hemoptysis, leg swelling, orthopnea. She is on symbicort and spiriva. Takes singulair at bedtime. She is still smoking around 10-15 cigarettes a day.   08/07/2022: OV with Dr. Celine Ibarra.  Poorly controlled asthma with COPD overlap.  Hospitalized April 2023, November 2020.  In January 2024 for COPD exacerbation.  Last hospitalization for hypercapnic respiratory failure secondary to prior exposure.  Discharge on home oxygen 2 L/min.  Ambulatory walk test without desaturation.  Does not need to continue wearing O2. HST ordered given difficulties with sleep. Smoking cessation - nicotine patches.   01/14/2023: Virtual visit with Dr. Celine Ibarra. Follow up. Sleep study too expensive. Hasn't needed any prednisone since last visit. Smoking 5-6 cigarettes/day. Albuterol use once before bed. PPI controlling GERD. Continue spiriva and Advair.   06/26/2023: [name] with Melissa Grunert NP  Discussed the use of AI scribe software for clinical note transcription with the patient, who gave verbal consent to proceed. History of Present Illness   [name] is a 43 year old female with asthma and COPD who presents with increased wheezing and shortness of breath. She has experienced increased wheezing and shortness of breath over the past couple of weeks, particularly noticeable when lying down at night. She experiences coughing with clear sputum and describes the sensation as 'something's like  sitting on my chest' and tightness. No fevers, chills, or hemoptysis. No low oxygen levels at home. No sinus symptoms. No leg swelling or orthopnea.  She did have an exacerbation in November which was treated  with z pack. No steroids. She uses her albuterol inhaler six or more times a day. She is currently on Advair and was on Spiriva until she ran out a couple of days ago. She has a nebulizer at home and has used it a few times this week. She feels her asthma is not well controlled, stating 'the inhalers just aren't doing anything now.' She has decreased her smoking. Down to 1-2 cigarettes a day. Using nicotine patches 21 mcg. Feels like they could be stronger. She has gained weight since trying to quit smoking, from 180 lbs to 190 lbs today.  Unable to complete FeNO  07/18/2023: Today - acute Patient presents today for acute visit.  At her last visit, we had treated her with Medrol Dosepak for acute exacerbation of asthma/COPD.  She has had a few exacerbations over the last year requiring steroids.  After she was seen, discussed with Dr. Celine Ibarra and given peripheral eosinophilia and poor control, decided to move forward with Dupixent.  She has completed this enrollment process.  Looks like it was approved.  Needs to be set up for her new start visit with the pharmacy team.  Today, she feels like she has been using her nebulizer treatments more.  Does not feel like she ever got better after her last visit.  Did feel little bit better on the Dosepak but when she came off of it felt like she just got worse again.  Having a lot of chest tightness and wheezing.  Having to wake up at night to use her nebulizer treatments.  She is doing these multiple times a day.  She has not been able to get this refilled and does not have many left at home.  Her primary care provider had sent a new prescription but she was told she would need a prior authorization, likely due to it being too soon for insurance to cover this. She's still coughing with yellow phlegm which has been ongoing for a few months. Last CXR without acute process.  She is still using her Advair twice daily and restarted her Spiriva.  She is on pantoprazole and  famotidine for reflux which feels like it is well-managed now.  She takes Claritin for her allergies which are okay at this time.  Denies any fevers, chills, hemoptysis.  Has had some trouble with orthopnea.  Having to sleep on a few pillows.  She had cardiac stress testing and echocardiogram done with a cardiologist at [facility] this month.  Workup was unremarkable.  No chest pain, palpitations, lightheadedness/dizziness, syncope.  We did discuss prior prednisone use. She had some swelling in her face a very long time ago when she took this. She does think she has taken it afterwards and done okay. We reviewed her chart. She has had courses after the documented allergy was listed in her chart.   Allergies  Allergen Reactions   Alprazolam Itching   Oxycodone Itching   Penicillins Nausea And Vomiting and Other (See Comments)    Has patient had a PCN reaction causing immediate rash, facial/tongue/throat swelling, SOB or lightheadedness with hypotension:No Has patient had a PCN reaction causing severe rash involving mucus membranes or skin necrosis:No Has patient had a PCN reaction that REACTION THAT REQUIRED HOSPITALIZATION:  #  #  #  YES  #  #  #  Has patient had a PCN reaction occurring within the last 10 years: #  #  #  YES  #  #  #    Norvasc [Amlodipine Besylate] Other (See Comments)    Tip of tongue became numb and caused flushing   Prednisone Other (See Comments) and Swelling    FACIAL swelling- breathing not affected, though    Immunization History  Administered Date(s) Administered   DT (Pediatric) 08/19/1984, 07/29/1985, 07/24/1987   DTP 03/28/1983   Hep B, Unspecified 02/26/1995, 04/03/1995, 09/03/1995   Influenza Inj Mdck Quad Pf 04/04/2021, 02/19/2022   Influenza, Mdck, Trivalent,PF 6+ MOS(egg free) 04/30/2023   Influenza,inj,Quad PF,6+ Mos 07/29/2014, 06/07/2015, 07/21/2017, 02/05/2018, 03/03/2019, 05/25/2022   Influenza-Unspecified 03/24/2012, 07/29/2014, 06/07/2015,  07/21/2017, 02/05/2018, 03/03/2019   MMR 06/19/1987   OPV 08/19/1984, 07/29/1985, 06/19/1987   PNEUMOCOCCAL CONJUGATE-20 01/04/2023   Pneumococcal Polysaccharide-23 07/29/2014, 06/08/2015   Pneumococcal-Unspecified 07/29/2014, 06/08/2015   Tdap 11/20/2011, 06/07/2015    Past Medical History:  Diagnosis Date   Anxiety    Asthma    Depression    doing good, not on meds now   GERD (gastroesophageal reflux disease)    Headache    migraines   History of kidney stones    Hypertension    Kidney stones    Missed abortion 10/02/2013   Ovarian cyst    Pregnancy induced hypertension    Suicidal intent    Vaginal Pap smear, abnormal    bx, f/u ok    Tobacco History: Social History   Tobacco Use  Smoking Status Every Day   Current packs/day: 1.00   Average packs/day: 1 pack/day for 15.0 years (15.0 ttl pk-yrs)   Types: Cigarettes  Smokeless Tobacco Never  Tobacco Comments   1-2 cigs per day 06/26/23 Vernie Murders, CMA   Ready to quit: No Counseling given: Yes Tobacco comments: 1-2 cigs per day 06/26/23 Vernie Murders, CMA   Outpatient Medications Prior to Visit  Medication Sig Dispense Refill   albuterol (VENTOLIN HFA) 108 (90 Base) MCG/ACT inhaler Inhale 2 puffs into the lungs every 6 (six) hours as needed for wheezing or shortness of breath. 18 each 5   buPROPion (WELLBUTRIN XL) 150 MG 24 hr tablet Take 150 mg by mouth daily.     busPIRone (BUSPAR) 10 MG tablet Take 10 mg by mouth 3 (three) times daily.     dicyclomine (BENTYL) 10 MG capsule Take 10 mg by mouth 3 (three) times daily before meals.     doxepin (SINEQUAN) 10 MG capsule Take 10 mg by mouth at bedtime.     famotidine (PEPCID) 40 MG tablet Take 1 tablet (40 mg total) by mouth at bedtime. 30 tablet 1   fluticasone-salmeterol (ADVAIR HFA) 230-21 MCG/ACT inhaler Inhale 2 puffs into the lungs 2 (two) times daily. 12 g 11   folic acid (FOLVITE) 1 MG tablet Take 1 mg by mouth daily.     gabapentin (NEURONTIN) 300 MG  capsule Take 300 mg by mouth 3 (three) times daily.     hydrOXYzine (VISTARIL) 25 MG capsule Take 1 capsule (25 mg total) by mouth 3 (three) times daily as needed for anxiety. 30 capsule 0   loratadine (CLARITIN) 10 MG tablet Take 1 tablet (10 mg total) by mouth daily. 30 tablet 2   losartan (COZAAR) 50 MG tablet Take 50 mg by mouth daily.     montelukast (SINGULAIR) 10 MG tablet Take 10 mg by mouth daily.  naltrexone (DEPADE) 50 MG tablet Take 50 mg by mouth daily.     nicotine (NICODERM CQ - DOSED IN MG/24 HOURS) 21 mg/24hr patch Place 1 patch (21 mg total) onto the skin daily. 30 patch 2   OLANZapine (ZYPREXA) 7.5 MG tablet Take 7.5 mg by mouth at bedtime.     ondansetron (ZOFRAN-ODT) 4 MG disintegrating tablet Take 1 tablet (4 mg total) by mouth every 8 (eight) hours as needed for nausea or vomiting. 20 tablet 0   oxazepam (SERAX) 10 MG capsule Take 10 mg by mouth 2 (two) times daily as needed.     Oxcarbazepine (TRILEPTAL) 300 MG tablet Take 300 mg by mouth 2 (two) times daily.     oxybutynin (DITROPAN-XL) 10 MG 24 hr tablet Take 10 mg by mouth daily.     pantoprazole (PROTONIX) 40 MG tablet TAKE 1 TABLET BY MOUTH EVERY DAY (Patient taking differently: Take 40 mg by mouth daily before breakfast.) 30 tablet 0   solifenacin (VESICARE) 5 MG tablet Take 5 mg by mouth daily.     Tiotropium Bromide Monohydrate (SPIRIVA RESPIMAT) 2.5 MCG/ACT AERS Inhale 2 puffs into the lungs daily. 1 each 11   venlafaxine XR (EFFEXOR-XR) 75 MG 24 hr capsule Take 1 tablet by mouth daily.     albuterol (ACCUNEB) 0.63 MG/3ML nebulizer solution Take 1 ampule by nebulization every 6 (six) hours as needed.     Azelastine HCl 137 MCG/SPRAY SOLN Place 2 sprays into both nostrils 2 (two) times daily.     citalopram (CELEXA) 20 MG tablet Take 20 mg by mouth at bedtime.     risperiDONE (RISPERDAL) 1 MG tablet Take 1 mg by mouth daily.     varenicline (CHANTIX) 0.5 MG tablet Take 0.5 mg by mouth 2 (two) times daily.      zolpidem (AMBIEN) 10 MG tablet Take 10 mg by mouth at bedtime as needed.     methylphenidate 27 MG PO CR tablet Take 27 mg by mouth in the morning.     methylPREDNISolone (MEDROL DOSEPAK) 4 MG TBPK tablet Take as prescribed 1 each 0   traZODone (DESYREL) 100 MG tablet Take 2 tablets (200 mg total) by mouth at bedtime as needed for sleep. 30 tablet 0   No facility-administered medications prior to visit.     Review of Systems:   Constitutional: No weight loss or gain, night sweats, fevers, chills, fatigue, or lassitude. HEENT: No headaches, difficulty swallowing, tooth/dental problems, or sore throat. No sneezing, itching, ear ache, nasal congestion, post nasal drip CV:  +orthopnea. No chest pain, PND, swelling in lower extremities, anasarca, dizziness, palpitations, syncope Resp: +shortness of breath with exertion; productive cough; wheezing; chest tightness.  No hemoptysis.   No chest wall deformity GI:  No heartburn, indigestion, abdominal pain, nausea, vomiting, diarrhea, change in bowel habits, loss of appetite GU: No dysuria, change in color of urine, urgency or frequency.   Skin: No rash, lesions, ulcerations MSK:  No joint pain or swelling.   Neuro: No dizziness or lightheadedness.  Psych: +stable depression, anxiety. No SI/HI. Mood stable.     Physical Exam:  BP 132/88   Pulse 89   Ht 5\' 4"  (1.626 m)   Wt 205 lb (93 kg)   SpO2 94%   BMI 35.19 kg/m   GEN: Pleasant, interactive, well-appearing; obese; non-toxic and in no acute distress. HEENT:  Normocephalic and atraumatic. PERRLA. Sclera white. Nasal turbinates pink, moist and patent bilaterally. No rhinorrhea present. Oropharynx pink and moist, without  exudate or edema. No lesions, ulcerations, or postnasal drip.  NECK:  Supple w/ fair ROM. No JVD present. Normal carotid impulses w/o bruits. Thyroid symmetrical with no goiter or nodules palpated. No lymphadenopathy.   CV: RRR, no m/r/g, no peripheral edema. Pulses intact,  +2 bilaterally. No cyanosis, pallor or clubbing. PULMONARY:  Unlabored, regular breathing. Scattered wheezes bilaterally A&P. No accessory muscle use.  GI: BS present and normoactive. Soft, non-tender to palpation. No organomegaly or masses detected.  MSK: No erythema, warmth or tenderness. Cap refil <2 sec all extrem. No deformities or joint swelling noted.  Neuro: A/Ox3. No focal deficits noted.   Skin: Warm, no lesions or rashe Psych: Normal affect and behavior. Judgement and thought content appropriate.     Lab Results:  CBC    Component Value Date/Time   WBC 9.2 03/26/2023 0844   RBC 4.52 03/26/2023 0844   HGB 13.4 03/26/2023 0844   HGB 16.1 (H) 08/19/2019 1426   HCT 41.3 03/26/2023 0844   HCT 48.6 (H) 08/19/2019 1426   PLT 338 03/26/2023 0844   PLT 347 08/19/2019 1426   MCV 91.4 03/26/2023 0844   MCV 90 08/19/2019 1426   MCH 29.6 03/26/2023 0844   MCHC 32.4 03/26/2023 0844   RDW 16.0 (H) 03/26/2023 0844   RDW 13.3 08/19/2019 1426   LYMPHSABS 1.3 03/26/2023 0844   MONOABS 0.7 03/26/2023 0844   EOSABS 0.4 03/26/2023 0844   BASOSABS 0.1 03/26/2023 0844    BMET    Component Value Date/Time   NA 137 03/26/2023 0844   NA 138 03/13/2020 1115   K 4.0 03/26/2023 0844   CL 106 03/26/2023 0844   CO2 23 03/26/2023 0844   GLUCOSE 103 (H) 03/26/2023 0844   BUN <5 (L) 03/26/2023 0844   BUN 9 03/13/2020 1115   CREATININE 0.96 03/26/2023 0844   CREATININE 1.09 10/31/2015 1506   CALCIUM 9.0 03/26/2023 0844   GFRNONAA >60 03/26/2023 0844   GFRNONAA 67 10/31/2015 1506   GFRAA 96 03/13/2020 1115   GFRAA 78 10/31/2015 1506    BNP No results found for: "BNP"   Imaging:  DG Chest 2 View Result Date: 06/26/2023 CLINICAL DATA:  Cough and shortness of breath. EXAM: CHEST - 2 VIEW COMPARISON:  Chest radiographs 03/26/2023, 06/18/2022, 05/24/2022; CT chest 05/24/2022 FINDINGS: Cardiac silhouette and mediastinal contours are within normal limits. The lungs are clear. Findings  diaphragms and mild hyperinflation, unchanged from prior. No pleural effusion pneumothorax. Mild-to-moderate multilevel degenerative disc changes of the mid to upper thoracic spine. IMPRESSION: 1. No active cardiopulmonary disease. 2. Mild hyperinflation, unchanged from prior. Electronically Signed   By: Neita Garnet M.D.   On: 06/26/2023 10:15    albuterol (PROVENTIL) (2.5 MG/3ML) 0.083% nebulizer solution 2.5 mg     Date Action Dose Route User   07/18/2023 1148 Given 2.5 mg Nebulization Dorna Mai, CMA      methylPREDNISolone acetate (DEPO-MEDROL) injection 120 mg     Date Action Dose Route User   07/18/2023 1149 Given 120 mg Intramuscular (Right Ventrogluteal) Dorna Mai, CMA          Latest Ref Rng & Units 11/16/2015   12:47 PM  PFT Results  FVC-Pre L 3.54   FVC-Predicted Pre % 94   Pre FEV1/FVC % % 55   FEV1-Pre L 1.96   FEV1-Predicted Pre % 62   DLCO uncorrected ml/min/mmHg 23.29   DLCO UNC% % 95   DLVA Predicted % 97   TLC L 8.11  TLC % Predicted % 160   RV % Predicted % 292     Lab Results  Component Value Date   NITRICOXIDE 7 10/18/2015        Assessment & Plan:   Severe persistent asthma with acute exacerbation Recurrent exacerbation of severe asthma/COPD. She is awaiting start of biologic therapy with Dupixent. Last CXR without acute process. Medrol dosepak minimally effective. Will treat her with depo 120 mg inj x 1 today and albuterol neb in office. Extended prednisone taper prescribed. Advised to monitor for any s/s of allergic reaction and stop/notify and seek care immediately if this occurs. Possible the facial swelling was side effect and not true allergic reaction. She only had this once and has done ok with oral prednisone after. If she tolerates well, will remove from allergy list. No indication for abx therapy at this time given CXR clear and no evidence of bacterial infection. Action plan in place. Set up pt for new start Dupixent visit.  Continue aggressive maintenance regimen. Advised her that albuterol is on the $4 list and there are also coupons for GoodRx should her insurance not cover her refill. She will notify of any further difficulties.   Patient Instructions  Continue Albuterol inhaler 2 puffs or 3 mL neb every 6 hours as needed for shortness of breath or wheezing. Notify if symptoms persist despite rescue inhaler/neb use. Use your nebulizer 2-3 times a day until symptoms improve. Insurance may have denied the refill because it was too soon. They should be able to fill this for you off the $4 generic list or with GoodRx  Continue spiriva 2 puffs daily Continue Advair 2 puffs Twice daily. Brush tongue and rinse mouth afterwards Continue pantoprazole 1 tab daily Continue claritin daily  Continue famotidine 1 tab 1-2 times a day as needed for breakthrough reflux  Restart nasal sprays with azelastine/fluticasone 1 spray Twice daily for allergies/sinus congestion   Prednisone taper. 4 tabs for 4 days, then 3 tabs for 4 days, 2 tabs for 4 days, then 1 tab for 4 days, then stop. Take in AM with food. Looks like you have had this since your documented allergy from what I can see in the chart and you think you did ok. If you develop any new facial swelling, tongue swelling or itching, stop and take a benadryl and go to the emergency department  Mucinex DM Twice daily as needed for cough/congestion   Start Dupixent injections - schedule today   Recent heart workup looked good  Allergy panel today    Schedule appointment for Dupixent start with [facility].  Follow up as scheduled with Dr. Celine Ibarra on 4/1. If symptoms do not improve or worsen, please contact office for sooner follow up or seek emergency care.    Asthma exacerbation in COPD (HCC) See above  Allergic rhinitis Stable sinus symptoms currently. Check allergen panel. May consider allergy referral pending results. If IgE elevated, will rule out asthma mimic such as  ABPA.    I spent 45 minutes of dedicated to the care of this patient on the date of this encounter to include pre-visit review of records, face-to-face time with the patient discussing conditions above, post visit ordering of testing, clinical documentation with the electronic health record, making appropriate referrals as documented, and communicating necessary findings to members of the patients care team.  Noemi Chapel, NP 07/18/2023  Pt aware and understands NP's role.

## 2023-07-18 NOTE — Telephone Encounter (Signed)
 Patient scheduled for Dupixent new on 07/30/23. Rx sent to [facility] to be couriered to clinic before appt  Chesley Mires, PharmD, MPH, BCPS, CPP Clinical Pharmacist (Rheumatology and Pulmonology)

## 2023-07-18 NOTE — Assessment & Plan Note (Addendum)
 Stable sinus symptoms currently. Check allergen panel. May consider allergy referral pending results. If IgE elevated, will rule out asthma mimic such as ABPA.

## 2023-07-18 NOTE — Telephone Encounter (Signed)
 Patient being seen in office today. Rx already sent to pharmacy by another office.

## 2023-07-18 NOTE — Assessment & Plan Note (Signed)
 See above

## 2023-07-18 NOTE — Assessment & Plan Note (Signed)
 Recurrent exacerbation of severe asthma/COPD. She is awaiting start of biologic therapy with Dupixent. Last CXR without acute process. Medrol dosepak minimally effective. Will treat her with depo 120 mg inj x 1 today and albuterol neb in office. Extended prednisone taper prescribed. Advised to monitor for any s/s of allergic reaction and stop/notify and seek care immediately if this occurs. Possible the facial swelling was side effect and not true allergic reaction. She only had this once and has done ok with oral prednisone after. If she tolerates well, will remove from allergy list. No indication for abx therapy at this time given CXR clear and no evidence of bacterial infection. Action plan in place. Set up pt for new start Dupixent visit. Continue aggressive maintenance regimen. Advised her that albuterol is on the $4 list and there are also coupons for GoodRx should her insurance not cover her refill. She will notify of any further difficulties.   Patient Instructions  Continue Albuterol inhaler 2 puffs or 3 mL neb every 6 hours as needed for shortness of breath or wheezing. Notify if symptoms persist despite rescue inhaler/neb use. Use your nebulizer 2-3 times a day until symptoms improve. Insurance may have denied the refill because it was too soon. They should be able to fill this for you off the $4 generic list or with GoodRx  Continue spiriva 2 puffs daily Continue Advair 2 puffs Twice daily. Brush tongue and rinse mouth afterwards Continue pantoprazole 1 tab daily Continue claritin daily  Continue famotidine 1 tab 1-2 times a day as needed for breakthrough reflux  Restart nasal sprays with azelastine/fluticasone 1 spray Twice daily for allergies/sinus congestion   Prednisone taper. 4 tabs for 4 days, then 3 tabs for 4 days, 2 tabs for 4 days, then 1 tab for 4 days, then stop. Take in AM with food. Looks like you have had this since your documented allergy from what I can see in the chart and  you think you did ok. If you develop any new facial swelling, tongue swelling or itching, stop and take a benadryl and go to the emergency department  Mucinex DM Twice daily as needed for cough/congestion   Start Dupixent injections - schedule today   Recent heart workup looked good  Allergy panel today    Schedule appointment for Dupixent start with [facility].  Follow up as scheduled with Dr. Celine Mans on 4/1. If symptoms do not improve or worsen, please contact office for sooner follow up or seek emergency care.

## 2023-07-18 NOTE — Patient Instructions (Addendum)
 Continue Albuterol inhaler 2 puffs or 3 mL neb every 6 hours as needed for shortness of breath or wheezing. Notify if symptoms persist despite rescue inhaler/neb use. Use your nebulizer 2-3 times a day until symptoms improve. Insurance may have denied the refill because it was too soon. They should be able to fill this for you off the $4 generic list or with GoodRx  Continue spiriva 2 puffs daily Continue Advair 2 puffs Twice daily. Brush tongue and rinse mouth afterwards Continue pantoprazole 1 tab daily Continue claritin daily  Continue famotidine 1 tab 1-2 times a day as needed for breakthrough reflux  Restart nasal sprays with azelastine/fluticasone 1 spray Twice daily for allergies/sinus congestion   Prednisone taper. 4 tabs for 4 days, then 3 tabs for 4 days, 2 tabs for 4 days, then 1 tab for 4 days, then stop. Take in AM with food. Looks like you have had this since your documented allergy from what I can see in the chart and you think you did ok. If you develop any new facial swelling, tongue swelling or itching, stop and take a benadryl and go to the emergency department  Mucinex DM Twice daily as needed for cough/congestion   Start Dupixent injections - schedule today   Recent heart workup looked good  Allergy panel today    Schedule appointment for Dupixent start with [place].  Follow up as scheduled with Dr. Celine Mans on 4/1. If symptoms do not improve or worsen, please contact office for sooner follow up or seek emergency care.

## 2023-07-21 ENCOUNTER — Other Ambulatory Visit: Payer: Self-pay

## 2023-07-21 NOTE — Telephone Encounter (Signed)
 This was addressed by [facility] at [facility].

## 2023-07-21 NOTE — Telephone Encounter (Signed)
 Addressed during office visit with katie.

## 2023-07-23 ENCOUNTER — Other Ambulatory Visit (HOSPITAL_COMMUNITY): Payer: Self-pay

## 2023-07-23 ENCOUNTER — Other Ambulatory Visit: Payer: Self-pay

## 2023-07-23 LAB — ALLERGEN PANEL (27) + IGE
Alternaria Alternata IgE: <0.1 kU/L
Aspergillus Fumigatus IgE: <0.1 kU/L
Bahia Grass IgE: <0.1 kU/L
Bermuda Grass IgE: <0.1 kU/L
Cat Dander IgE: <0.1 kU/L
Cedar, Mountain IgE: <0.1 kU/L
Cladosporium Herbarum IgE: <0.1 kU/L
Cocklebur IgE: <0.1 kU/L
Cockroach, American IgE: <0.1 kU/L
Common Silver Birch IgE: <0.1 kU/L
D Farinae IgE: <0.1 kU/L
D Pteronyssinus IgE: 0.47 kU/L — AB
Dog Dander IgE: <0.1 kU/L
Elm, American IgE: <0.1 kU/L
Hickory, White IgE: <0.1 kU/L
IgE (Immunoglobulin E), Serum: 31 [IU]/mL (ref 6–495)
Johnson Grass IgE: <0.1 kU/L
Kentucky Bluegrass IgE: <0.1 kU/L
Maple/Box Elder IgE: <0.1 kU/L
Mucor Racemosus IgE: <0.1 kU/L
Oak, White IgE: <0.1 kU/L
Penicillium Chrysogen IgE: <0.1 kU/L
Pigweed, Rough IgE: <0.1 kU/L
Plantain, English IgE: <0.1 kU/L
Ragweed, Short IgE: <0.1 kU/L
Setomelanomma Rostrat: <0.1 kU/L
Timothy Grass IgE: <0.1 kU/L
White Mulberry IgE: <0.1 kU/L

## 2023-07-23 NOTE — Progress Notes (Signed)
 Specialty Pharmacy Initial Fill Coordination Note  [name] is a 43 y.o. female contacted today regarding initial fill of specialty medication(s) Dupilumab (Dupixent)   Patient requested Courier to Provider Office   Delivery date: 07/28/23   Verified address: [address] [place], [place] [address]   Medication will be filled on 07/25/23.   Patient is aware of $4 copayment. States she does not get paid until next week and would like to bill [facility] account for now.

## 2023-07-24 ENCOUNTER — Other Ambulatory Visit: Payer: Self-pay

## 2023-07-24 NOTE — Progress Notes (Addendum)
 HPI Patient presents today to [facility] to see pharmacy team for Dupixent new start for asthma-COPD overlap.  Last seen by Rhunette Croft, NP on 07/18/2023 . Was prescribed prednisone course - took last dose this morning. Reports that it has definitely helped. Denies any allergic reaction (rash, hives, throat swelling, facial swelling). She has not been needing oxygen at night recently. Had COPD-related ED visit in Nov 2024  Today she reports feeling better since being on prednisone. She states that she has been off of Advair due to insurance issues. Her insurance preferred another option. She has solely been using Sprivia and montelukast  Respiratory Medications Current regimen: Advair 230-63mcg (2 puffs twice daily), Spiriva (2 puffs daily), montelukast 10mg  daily Patient reports no known adherence challenges  OBJECTIVE Allergies  Allergen Reactions   Alprazolam Itching   Oxycodone Itching   Penicillins Nausea And Vomiting and Other (See Comments)    Has patient had a PCN reaction causing immediate rash, facial/tongue/throat swelling, SOB or lightheadedness with hypotension:No Has patient had a PCN reaction causing severe rash involving mucus membranes or skin necrosis:No Has patient had a PCN reaction that REACTION THAT REQUIRED HOSPITALIZATION:  #  #  #  YES  #  #  #  Has patient had a PCN reaction occurring within the last 10 years: #  #  #  YES  #  #  #    Norvasc [Amlodipine Besylate] Other (See Comments)    Tip of tongue became numb and caused flushing    Outpatient Encounter Medications as of 07/30/2023  Medication Sig Note   budeson-glycopyrrolate-formoterol (BREZTRI AEROSPHERE) 160-9-4.8 MCG/ACT AERO Inhale 2 puffs into the lungs in the morning and at bedtime.    albuterol (PROVENTIL) (2.5 MG/3ML) 0.083% nebulizer solution Take 3 mLs (2.5 mg total) by nebulization every 6 (six) hours as needed for wheezing or shortness of breath.    albuterol (VENTOLIN HFA) 108 (90 Base)  MCG/ACT inhaler Inhale 2 puffs into the lungs every 6 (six) hours as needed for wheezing or shortness of breath.    Azelastine-Fluticasone 137-50 MCG/ACT SUSP Place 1 spray into the nose in the morning and at bedtime.    buPROPion (WELLBUTRIN XL) 150 MG 24 hr tablet Take 150 mg by mouth daily.    busPIRone (BUSPAR) 10 MG tablet Take 10 mg by mouth 3 (three) times daily.    dicyclomine (BENTYL) 10 MG capsule Take 10 mg by mouth 3 (three) times daily before meals.    Dupilumab (DUPIXENT) 300 MG/2ML SOAJ Inject 300 mg into the skin every 14 (fourteen) days.    famotidine (PEPCID) 40 MG tablet Take 1 tablet (40 mg total) by mouth at bedtime.    fluticasone-salmeterol (ADVAIR HFA) 230-21 MCG/ACT inhaler Inhale 2 puffs into the lungs 2 (two) times daily.    folic acid (FOLVITE) 1 MG tablet Take 1 mg by mouth daily.    gabapentin (NEURONTIN) 300 MG capsule Take 300 mg by mouth 3 (three) times daily.    hydrOXYzine (VISTARIL) 25 MG capsule Take 1 capsule (25 mg total) by mouth 3 (three) times daily as needed for anxiety.    loratadine (CLARITIN) 10 MG tablet Take 1 tablet (10 mg total) by mouth daily.    losartan (COZAAR) 50 MG tablet Take 50 mg by mouth daily.    montelukast (SINGULAIR) 10 MG tablet Take 10 mg by mouth daily.    naltrexone (DEPADE) 50 MG tablet Take 50 mg by mouth daily.    nicotine (NICODERM  CQ - DOSED IN MG/24 HOURS) 21 mg/24hr patch Place 1 patch (21 mg total) onto the skin daily.    OLANZapine (ZYPREXA) 7.5 MG tablet Take 7.5 mg by mouth at bedtime.    ondansetron (ZOFRAN-ODT) 4 MG disintegrating tablet Take 1 tablet (4 mg total) by mouth every 8 (eight) hours as needed for nausea or vomiting.    oxazepam (SERAX) 10 MG capsule Take 10 mg by mouth 2 (two) times daily as needed.    Oxcarbazepine (TRILEPTAL) 300 MG tablet Take 300 mg by mouth 2 (two) times daily.    oxybutynin (DITROPAN-XL) 10 MG 24 hr tablet Take 10 mg by mouth daily.    predniSONE (DELTASONE) 10 MG tablet 4 tabs for  4 days, then 3 tabs for 4 days, 2 tabs for 4 days, then 1 tab for 4 days, then stop    solifenacin (VESICARE) 5 MG tablet Take 5 mg by mouth daily.    Tiotropium Bromide Monohydrate (SPIRIVA RESPIMAT) 2.5 MCG/ACT AERS Inhale 2 puffs into the lungs daily.    [DISCONTINUED] doxepin (SINEQUAN) 10 MG capsule Take 10 mg by mouth at bedtime.    [DISCONTINUED] Dupilumab (DUPIXENT) 300 MG/2ML SOAJ Inject 600mg  into the skin at Week 0 then 300mg  into the skin every 2 weeks. Courier to pulm: [address], [address], [place] [address]. Appt on 07/30/23    [DISCONTINUED] pantoprazole (PROTONIX) 40 MG tablet TAKE 1 TABLET BY MOUTH EVERY DAY (Patient taking differently: Take 40 mg by mouth daily before breakfast.)    [DISCONTINUED] venlafaxine XR (EFFEXOR-XR) 75 MG 24 hr capsule Take 1 tablet by mouth daily. 12/30/2022: Pt states she took herself off of this medication   No facility-administered encounter medications on file as of 07/30/2023.     Immunization History  Administered Date(s) Administered   DT (Pediatric) 08/19/1984, 07/29/1985, 07/24/1987   DTP 03/28/1983   Hep B, Unspecified 02/26/1995, 04/03/1995, 09/03/1995   Influenza Inj Mdck Quad Pf 04/04/2021, 02/19/2022   Influenza, Mdck, Trivalent,PF 6+ MOS(egg free) 04/30/2023   Influenza,inj,Quad PF,6+ Mos 07/29/2014, 06/07/2015, 07/21/2017, 02/05/2018, 03/03/2019, 05/25/2022   Influenza-Unspecified 03/24/2012, 07/29/2014, 06/07/2015, 07/21/2017, 02/05/2018, 03/03/2019   MMR 06/19/1987   OPV 08/19/1984, 07/29/1985, 06/19/1987   PNEUMOCOCCAL CONJUGATE-20 01/04/2023   Pneumococcal Polysaccharide-23 07/29/2014, 06/08/2015   Pneumococcal-Unspecified 07/29/2014, 06/08/2015   Tdap 11/20/2011, 06/07/2015    PFTs    Latest Ref Rng & Units 11/16/2015   12:47 PM  PFT Results  FVC-Pre L 3.54   FVC-Predicted Pre % 94   Pre FEV1/FVC % % 55   FEV1-Pre L 1.96   FEV1-Predicted Pre % 62   DLCO uncorrected ml/min/mmHg 23.29   DLCO UNC% % 95   DLVA  Predicted % 97   TLC L 8.11   TLC % Predicted % 160   RV % Predicted % 292     Eosinophils Most recent blood eosinophil count was 400 cells/microL taken on 03/26/2023.   IgE: 31 on 07/18/2023  Assessment   Biologics training for dupilumab (Dupixent)  Goals of therapy: Mechanism: human monoclonal IgG4 antibody that inhibits interleukin-4 and interleukin-13 cytokine-induced responses, including release of proinflammatory cytokines, chemokines, and IgE Reviewed that Dupixent is add-on medication and patient must continue maintenance inhaler regimen. Response to therapy: may take 4 months to determine efficacy. Discussed that patients generally feel improvement sooner than 4 months.  Side effects: injection site reaction (6-18%), antibody development (5-16%), ophthalmic conjunctivitis (2-16%), transient blood eosinophilia (1-2%)  Dose: 600mg  at Week 0 (administered today in clinic) followed  by 300mg  every 14 days thereafter  Administration/Storage:  Reviewed administration sites of thigh or abdomen (at least 2-3 inches away from abdomen). Reviewed the upper arm is only appropriate if caregiver is administering injection  Do not shake pen/syringe as this could lead to product foaming or precipitation. Do not use if solution is discolored or contains particulate matter or if window on prefilled pen is yellow (indicates pen has been used).  Reviewed storage of medication in refrigerator. Reviewed that Dupixent can be stored at room temperature in unopened carton for up to 14 days.  Access: Approval of Dupixent through: insurance  Patient self-administered Dupixent 300mg /6ml x 2 (total dose 600mg ) in right lower abdomen and left lower abdomen using pharmacy-supplied: Dupixent 300mg /29mL autoinjector pen NDC: [id number] Lot: [id number] Expiration: 04/18/2025  Patient monitored for 30 minutes for adverse reaction.  Patient tolerated well.  Injection site noted. Patient denies itchiness and  irritation at injection., No swelling or redness noted., and Reviewed injection site reaction management with patient verbally and printed information for review in AVS  Medication Reconciliation  A drug regimen assessment was performed, including review of allergies, interactions, disease-state management, dosing and immunization history. Medications were reviewed with the patient, including name, instructions, indication, goals of therapy, potential side effects, importance of adherence, and safe use.  Drug interaction(s): none noted  Prednisone removed from medication list.  PLAN Continue Dupixent 300mg  every 14 days.  Next dose is due 08/13/23 and every 14 days thereafter. Rx sent to: [facility]: [phone] .  Patient provided with pharmacy phone number and advised that they will call to schedule shipment to home. Continue maintenance regimen of: Advair 230-67mcg (2 puffs twice daily), Spiriva (2 puffs daily), montelukast, montelukast 10mg  nightly Provided with Breztri sample today so she is consistently on ICS/LABA/LAMA. Hold Spiriva while on Breztri.  Message sent to prior authorization team to determine most cost effective ICS/LABA for patient since it appears the Advair may no longer be preferred.   All questions encouraged and answered.  Instructed patient to reach out with any further questions or concerns.  Thank you for allowing pharmacy to participate in this patient's care.  This appointment required 45 minutes of patient care (this includes precharting, chart review, review of results, face-to-face care, etc.).   Chesley Mires, PharmD, MPH, BCPS, CPP Clinical Pharmacist (Rheumatology and Pulmonology)

## 2023-07-25 ENCOUNTER — Other Ambulatory Visit: Payer: Self-pay

## 2023-07-30 ENCOUNTER — Ambulatory Visit (INDEPENDENT_AMBULATORY_CARE_PROVIDER_SITE_OTHER): Payer: Medicaid Other | Admitting: Pharmacist

## 2023-07-30 ENCOUNTER — Telehealth: Payer: Self-pay | Admitting: Pharmacist

## 2023-07-30 ENCOUNTER — Other Ambulatory Visit: Payer: Self-pay

## 2023-07-30 DIAGNOSIS — F17210 Nicotine dependence, cigarettes, uncomplicated: Secondary | ICD-10-CM | POA: Diagnosis not present

## 2023-07-30 DIAGNOSIS — Z7189 Other specified counseling: Secondary | ICD-10-CM

## 2023-07-30 DIAGNOSIS — J4489 Other specified chronic obstructive pulmonary disease: Secondary | ICD-10-CM | POA: Diagnosis not present

## 2023-07-30 MED ORDER — DUPIXENT 300 MG/2ML ~~LOC~~ SOAJ
300.0000 mg | SUBCUTANEOUS | 1 refills | Status: DC
  Filled 2023-07-30: qty 12, 84d supply, fill #0
  Filled 2023-08-12: qty 4, 28d supply, fill #0
  Filled 2023-09-30: qty 4, 28d supply, fill #1
  Filled 2023-11-03 – 2023-11-04 (×2): qty 4, 28d supply, fill #2
  Filled 2023-12-01: qty 4, 28d supply, fill #3
  Filled 2023-12-11 – 2023-12-25 (×2): qty 4, 28d supply, fill #4
  Filled 2024-01-15: qty 4, 28d supply, fill #5

## 2023-07-30 MED ORDER — BREZTRI AEROSPHERE 160-9-4.8 MCG/ACT IN AERO: 2.0000 | INHALATION_SPRAY | Freq: Two times a day (BID) | RESPIRATORY_TRACT | Status: DC

## 2023-07-30 NOTE — Patient Instructions (Addendum)
 Your next DUPIXENT dose is due on 08/13/23, 08/27/23, and every 14 days thereafter  CONTINUE Advair and Spiriva  and montelukast 10 mg nightly Start Breztri 2 puffs twice daily (hold Spiriva and Advair until we figure out what inhalers your insurnace preference)  Your prescription will be shipped from [facility]. Their phone number is [phone] They will call to schedule shipment and confirm address. They will mail your medication to your home.  You will need to be seen by your provider in 3 to 4 months to assess how Dupixent is working for you. Please ensure you have a follow-up appointment scheduled in June or July 2025. Call our clinic if you need to make this appointment.  Stay up to date on all routine vaccines: influenza, pneumonia, COVID19, Shingles  How to manage an injection site reaction: Remember the 5 C's: COUNTER - leave on the counter at least 30 minutes but up to overnight to bring medication to room temperature. This may help prevent stinging COLD - place something cold (like an ice gel pack or cold water bottle) on the injection site just before cleansing with alcohol. This may help reduce pain CLARITIN - use Claritin (generic name is loratadine) for the first two weeks of treatment or the day of, the day before, and the day after injecting. This will help to minimize injection site reactions CORTISONE CREAM - apply if injection site is irritated and itching CALL ME - if injection site reaction is bigger than the size of your fist, looks infected, blisters, or if you develop hives

## 2023-07-30 NOTE — Telephone Encounter (Signed)
 Can you see what high-dose ICS/LABA is covered by insurance? Patient reports that Advair is not covered by insurance anymore.  HFA is preferred but can see if any DPIs are covered.  Can you see if Trelegy or Markus Daft are covered? Patient has asthma-COPD overlap  Route message back to triage  Chesley Mires, PharmD, MPH, BCPS, CPP Clinical Pharmacist (Rheumatology and Pulmonology)

## 2023-07-30 NOTE — Progress Notes (Signed)
 Patient started Dupixent in clinic for asthma-COPD overlap. Tolerated well. Inject 600mg  loading dose in left lower abdomen and right lower abdomen.  Continue Dupixent 300mg  SQ every 14 days Continue Advair 230-37mcg (2 puffs twice daily), Spiriva (2 puffs daily), montelukast 10mg  daily   Chesley Mires, PharmD, MPH, BCPS, CPP Clinical Pharmacist (Rheumatology and Pulmonology)

## 2023-07-31 ENCOUNTER — Telehealth: Payer: Self-pay

## 2023-07-31 ENCOUNTER — Other Ambulatory Visit (HOSPITAL_COMMUNITY): Payer: Self-pay

## 2023-07-31 MED ORDER — ADVAIR HFA 230-21 MCG/ACT IN AERO
2.0000 | INHALATION_SPRAY | Freq: Two times a day (BID) | RESPIRATORY_TRACT | 12 refills | Status: DC
Start: 2023-07-31 — End: 2023-08-19

## 2023-07-31 NOTE — Telephone Encounter (Signed)
 Sending in Advair HFA with DAW for brand name. Called patient to advise and confirm pharmacy  NFN  Chesley Mires, PharmD, MPH, BCPS, CPP Clinical Pharmacist (Rheumatology and Pulmonology)

## 2023-07-31 NOTE — Telephone Encounter (Signed)
 Per test claims:   Brand Advair Diskus: $4.00 Brand Advair HFA: $4.00 Brand Symbicort: $4.00 Dulera: $4.00  Triple therapy inhalers are not covered.

## 2023-07-31 NOTE — Telephone Encounter (Signed)
 Dr.Desai can you please advise  These are the inhalers that are coved by patient's insurance .  Per test claims:    Brand Advair Diskus: $4.00 Brand Advair HFA: $4.00 Brand Symbicort: $4.00 Dulera: $4.00   Triple therapy inhalers are not covered

## 2023-07-31 NOTE — Telephone Encounter (Signed)
*  sent to office in original message   Per test claims:   Brand Advair Diskus: $4.00 Brand Advair HFA: $4.00 Brand Symbicort: $4.00 Dulera: $4.00  Triple therapy inhalers are not covered.

## 2023-08-12 ENCOUNTER — Other Ambulatory Visit: Payer: Self-pay

## 2023-08-12 NOTE — Progress Notes (Signed)
 Specialty Pharmacy Refill Coordination Note  [name] is a 43 y.o. female contacted today regarding refills of specialty medication(s) Dupilumab (Dupixent)   Patient requested (Patient-Rptd) Delivery   Delivery date: (Patient-Rptd) 08/19/23   Verified address: (Patient-Rptd) [address] [place] [address]   Medication will be filled on 03.31.25.

## 2023-08-18 ENCOUNTER — Other Ambulatory Visit: Payer: Self-pay

## 2023-08-19 ENCOUNTER — Ambulatory Visit: Payer: Medicaid Other | Admitting: Internal Medicine

## 2023-08-19 ENCOUNTER — Encounter: Payer: Self-pay | Admitting: Internal Medicine

## 2023-08-19 VITALS — BP 130/74 | HR 100 | Ht 64.0 in | Wt 209.4 lb

## 2023-08-19 DIAGNOSIS — Z716 Tobacco abuse counseling: Secondary | ICD-10-CM | POA: Diagnosis not present

## 2023-08-19 DIAGNOSIS — K219 Gastro-esophageal reflux disease without esophagitis: Secondary | ICD-10-CM

## 2023-08-19 DIAGNOSIS — J309 Allergic rhinitis, unspecified: Secondary | ICD-10-CM | POA: Diagnosis not present

## 2023-08-19 DIAGNOSIS — J4489 Other specified chronic obstructive pulmonary disease: Secondary | ICD-10-CM

## 2023-08-19 MED ORDER — AZELASTINE-FLUTICASONE 137-50 MCG/ACT NA SUSP
1.0000 | Freq: Two times a day (BID) | NASAL | 11 refills | Status: AC
Start: 2023-08-19 — End: ?

## 2023-08-19 MED ORDER — PANTOPRAZOLE SODIUM 40 MG PO TBEC: 40.0000 mg | DELAYED_RELEASE_TABLET | Freq: Every day | ORAL | 11 refills | Status: DC

## 2023-08-19 MED ORDER — MONTELUKAST SODIUM 10 MG PO TABS: 10.0000 mg | ORAL_TABLET | Freq: Every day | ORAL | 11 refills | Status: AC

## 2023-08-19 MED ORDER — ADVAIR HFA 230-21 MCG/ACT IN AERO: 2.0000 | INHALATION_SPRAY | Freq: Two times a day (BID) | RESPIRATORY_TRACT | 12 refills | Status: DC

## 2023-08-19 NOTE — Patient Instructions (Addendum)
 It was a pleasure to see you today!  Please schedule follow up scheduled with myself in 3 months.  If my schedule is not open yet, we will contact you with a reminder closer to that time. Please call [phone] if you haven't heard from [place] a month before, and always call us sooner if issues or concerns arise. You can also send [place] a message through MyChart, but but aware that this is not to be used for urgent issues and it may take up to 5-7 days to receive a reply. Please be aware that you will likely be able to view your results before I have a chance to respond to them. Please give [place] 5 business days to respond to any non-urgent results.    For the asthma Continue the Dupixent injections.  I have stopped her Symbicort and Breztri and I want you to take Advair 232 puffs twice daily for maintenance inhaler instead.  This is sent to your pharmacy.  Continue the albuterol as needed. Remember pursed lip breathing to gain control of your breath.  Smell the roses, blow out the candles.  For reflux Continue the pantoprazole, I have refilled this  [place] job on cutting back on smoking.  Keep up the good work, I have faith in you to cut back completely.  Continue to work with your clinician on anxiety management.  I encourage you to see a therapist for talk therapy which may also be helpful.  Managing your anxiety is also important for smoking cessation and asthma management.

## 2023-08-19 NOTE — Progress Notes (Signed)
 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  Hospitalized April 2023, Nov 2023, and Jan 2024 for copd exacerbation. Has stopped wearing oxygen. Saw [facility] for recurrent asthma exacerbations - started on dupixent.   Interval Updates: Here for follow up after starting dupixent. First dose was in March 2025. Has had three injections so far. Thinks it is helping.   She was given advair rx but didn't know it was sent in. She is still taking symbicort and ventolin  Taking albuterol 3-4 times/day. Symptoms -   Has cut back on smoking down to 3-4/day. She wants to quit for good and she feels like she is close.   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 She is on medication. She was doing talk therapy. Went once.   Albuterol use is mostly once daily before bed.  Current Regimen: symbicort 160 2 puffs twice daily, prn albuterol  Asthma Triggers: cold weather/hot weather, animals, cigarettes, anxiety Exacerbations in the last year: one History of hospitalization or intubation: yes, 3 times. Allergy Testing: never had.  GERD: yes controlled on PPI Allergic Rhinitis:yes ACT:  Asthma Control Test ACT Total Score  08/19/2023 10:40 AM 12  03/27/2021  3:53 PM 12  07/15/2019  4:04 PM 11   FeNO: n/a  I have reviewed the patient's family social and past medical history and updated as appropriate.   Past Medical History:  Diagnosis Date   Anxiety    Asthma    Depression    doing good, not on meds now   GERD (gastroesophageal reflux disease)    Headache    migraines   History of kidney stones    Hypertension    Kidney stones    Missed abortion  10/02/2013   Ovarian cyst    Pregnancy induced hypertension    Suicidal intent    Vaginal Pap smear, abnormal    bx, f/u ok    Past Surgical History:  Procedure Laterality Date   CESAREAN SECTION     CESAREAN SECTION N/A 06/06/2015   Procedure: CESAREAN SECTION;  Surgeon: Kathreen Cosier, MD;  Location: [facility];  Service: Obstetrics;  Laterality: N/A;   CHOLECYSTECTOMY N/A 08/23/2016   Procedure: LAPAROSCOPIC CHOLECYSTECTOMY;  Surgeon: Berna Bue, MD;  Location: [facility];  Service: General;  Laterality: N/A;   DILATION AND CURETTAGE OF UTERUS     DILATION AND EVACUATION N/A 10/04/2013   Procedure: DILATATION AND EVACUATION;  Surgeon: Sherian Rein, MD;  Location: [facility];  Service: Gynecology;  Laterality: N/A;  [place] in room    TONSILLECTOMY     TUBAL LIGATION      Family History  Problem Relation Age of Onset   Hearing loss Mother    Asthma Mother    Asthma Father    Diabetes Son    Cancer Maternal Grandfather        bone   Liver disease Maternal Grandfather    Asthma Sister    Lupus Sister     Social History   Occupational History   Not on file  Tobacco Use   Smoking status: Every Day  Current packs/day: 1.00    Average packs/day: 1 pack/day for 15.0 years (15.0 ttl pk-yrs)    Types: Cigarettes   Smokeless tobacco: Never   Tobacco comments:    1-2 cigs per day 08/19/2023  Vaping Use   Vaping status: Never Used  Substance and Sexual Activity   Alcohol use: Yes    Alcohol/week: 4.0 standard drinks of alcohol    Types: 4 Cans of beer per week   Drug use: No   Sexual activity: Yes    Partners: Male    Birth control/protection: Surgical    Physical Exam: Blood pressure 130/74, pulse 100, height 5\' 4"  (1.626 m), weight 209 lb 6.4 oz (95 kg), SpO2 94%.  Gen: well appearing, no distres CV: RRR no mrg Resp: ctab no wheezes or crackles  Data Reviewed: Imaging: I have personally reviewed the chest xray from 02/2019 which demonstrates no acute  cardiopulmonary process  PFTs:     Latest Ref Rng & Units 11/16/2015   12:47 PM  PFT Results  FVC-Pre L 3.54   FVC-Predicted Pre % 94   Pre FEV1/FVC % % 55   FEV1-Pre L 1.96   FEV1-Predicted Pre % 62   DLCO uncorrected ml/min/mmHg 23.29   DLCO UNC% % 95   DLVA Predicted % 97   TLC L 8.11   TLC % Predicted % 160   RV % Predicted % 292    I have personally reviewed the patient's PFTs and which demonstrate moderate airflow limitation   Labs: Lab Results  Component Value Date   WBC 9.2 03/26/2023   HGB 13.4 03/26/2023   HCT 41.3 03/26/2023   MCV 91.4 03/26/2023   PLT 338 03/26/2023   Lab Results  Component Value Date   NA 137 03/26/2023   K 4.0 03/26/2023   CL 106 03/26/2023   CO2 23 03/26/2023    Immunization status: Immunization History  Administered Date(s) Administered   DT (Pediatric) 08/19/1984, 07/29/1985, 07/24/1987   DTP 03/28/1983   Hep B, Unspecified 02/26/1995, 04/03/1995, 09/03/1995   Influenza Inj Mdck Quad Pf 04/04/2021, 02/19/2022   Influenza, Mdck, Trivalent,PF 6+ MOS(egg free) 04/30/2023   Influenza,inj,Quad PF,6+ Mos 07/29/2014, 06/07/2015, 07/21/2017, 02/05/2018, 03/03/2019, 05/25/2022   Influenza-Unspecified 03/24/2012, 07/29/2014, 06/07/2015, 07/21/2017, 02/05/2018, 03/03/2019   MMR 06/19/1987   OPV 08/19/1984, 07/29/1985, 06/19/1987   PNEUMOCOCCAL CONJUGATE-20 01/04/2023   Pneumococcal Polysaccharide-23 07/29/2014, 06/08/2015   Pneumococcal-Unspecified 07/29/2014, 06/08/2015   Tdap 11/20/2011, 06/07/2015    Assessment:  Severe Asthma COPD overlap syndrome, 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  GERD Excessive daytime sleepiness - sleep study deferred per patient.   Plan/Recommendations:  For the asthma Continue the Dupixent injections.  I have stopped her Symbicort and Breztri and I want you to take Advair 232 puffs twice daily for maintenance inhaler instead.  This is sent to your pharmacy.   Continue the albuterol as needed. Remember pursed lip breathing to gain control of your breath.  Smell the roses, blow out the candles.  For reflux Continue the pantoprazole, I have refilled this  [place] job on cutting back on smoking.  Keep up the good work, I have faith in you to cut back completely.  Continue to work with your clinician on anxiety management.  I encourage you to see a therapist for talk therapy which may also be helpful.  Managing your anxiety is also important for smoking cessation and asthma management.   Return to Care: Return in about 3 months (around 11/18/2023).  Durel Salts, MD Pulmonary and Critical Care Medicine [facility] Office:[phone]

## 2023-09-05 NOTE — Telephone Encounter (Signed)
 Patient ID:  [name] is a 43 y.o. (DOB 1980-08-28) female  E-Visit Assessment   1. Frequency of urination    E-Visit Plan     Patient's Medications       * Accurate as of September 05, 2023 12:22 PM. Reflects encounter med changes as of last refresh          New Prescriptions      Instructions  nitrofurantoin (macrocrystal-monohydrate) 100 mg capsule Commonly known as: MACROBID Started by: Charmaine Heller, NP  100 mg, Oral, 2 times a day       Continued Medications      Instructions  * albuterol  sulfate HFA 108 (90 Base) MCG/ACT inhaler Commonly known as: PROVENTIL ,VENTOLIN ,PROAIR   2 puffs, Inhalation, Every 6 hours as needed   * albuterol  sulfate HFA 108 (90 Base) MCG/ACT inhaler Commonly known as: VENTOLIN  HFA  2 puffs, Inhalation, Every 6 hours as needed respiratory   * albuterol  0.63 mg/3 mL nebulizer solution Commonly known as: ACCUNEB   0.63 mg, Nebulization, Every 6 hours as needed   buPROPion  HCl 150 mg 24 hr tablet Commonly known as: WELLBUTRIN  XL  150 mg, Oral, Every morning   busPIRone  10 mg tablet Commonly known as: BUSPAR   10 mg, Oral, 3 times daily   celecoxib  200 mg capsule Commonly known as: CELEBREX   200 mg, Oral, 2 times a day   dicyclomine  10 mg capsule Commonly known as: BENTYL   10 mg, Oral, 30 minutes before meals & at bedtime   doxepin HCl 10 mg capsule Commonly known as: SINEQUAN  10 mg, Oral, At bedtime   fluticasone -salmeterol 230-21 MCG/ACT inhaler Commonly known as: ADVAIR  HFA  2 puffs, Inhalation, 2 times a day   folic acid  1 mg tablet  1 mg, Oral, Daily   gabapentin  300 mg capsule Commonly known as: NEURONTIN   300 mg, Oral, 3 times a day   hydrOXYzine  pamoate 25 mg capsule Commonly known as: VISTARIL   25 mg, Oral, 3 times a day as needed   losartan  potassium 50 mg tablet Commonly known as: COZAAR   50 mg, Oral, 2 times a day   Misc. Devices Misc  Nebulizer machine with supplies  Dx Asthma    montelukast  10 MG tablet Commonly known as: SINGULAIR   10 mg, Oral, Daily   naltrexone  50 mg tablet Commonly known as: REVIA   50 mg, Oral, Daily   OLANZapine  7.5 mg tablet Commonly known as: ZYPREXA   7.5 mg, Oral, At bedtime   ondansetron  4 mg tablet Commonly known as: ZOFRAN   4 mg, Oral, Every 8 hours as needed   oxazepam 10 MG capsule Commonly known as: SERAX  Oral, 2 times a day as needed   OXcarbazepine 300 mg tablet Commonly known as: TRILEPTAL  300 mg, Oral, 2 times a day   pantoprazole  sodium 40 mg tablet Commonly known as: PROTONIX   40 mg, Oral, Daily   semaglutide-weight management 0.25 mg/0.5 mL Soaj injection Commonly known as: WEGOVY  0.25 mg, Subcutaneous, Weekly   solifenacin succinate 10 mg tablet Commonly known as: VESICARE  10 mg, Oral, Daily   SPIRIVA  RESPIMAT 2.5 MCG/ACT inhaler Generic drug: tiotropium bromide   1 puff, Inhalation, 2 times a day   SYMBICORT  160-4.5 MCG/ACT inhaler Generic drug: budesonide -formoterol   SMARTSIG:Via Inhaler   tiZANidine 4 mg tablet Commonly known as: ZANAFLEX  4 mg, Oral, 3 times a day      * * This list has 3 medication(s) that are the same as other medications prescribed for you. Read  the directions carefully, and ask your doctor or other care provider to review them with you.           Risk, benefits, and alternatives were provided through patient instructions given to the patient electronically.  If any worsening symptoms or lack of improvement, the patient will seek immediate medical care.  E-Visit History  HPI is reviewed through E-Visit questionnaire.  Patient is appropriate for E-Visit and consented online understanding potential risks, benefits and alternatives of E-Visit delivery of care.  Per mychart message: All yesterday and throughout the night I've been having trouble urinating. I've taken pain medicine ibuprofen  and nothing is helping. It's now in my lower back hurting and I'm drinking  water and taking my vesicare but births helping the pain. Patient Active Problem List   Diagnosis Date Noted  . Chronic bilateral low back pain without sciatica 06/17/2023  . Sacroiliitis 06/17/2023  . Arthropathy of lumbar facet joint 06/17/2023  . Overactive bladder 04/30/2023  . Drug-induced constipation 04/30/2023  . Pericardial effusion (*) 05/28/2022    Last Assessment & Plan: Formatting of this note might be different from the original. Trace in size.  No chest pain, doubt pericarditis. - Attn on follow up imaging   . Obesity (BMI 30-39.9) 05/28/2022  . Cigarette nicotine  dependence with nicotine -induced disorder 04/23/2022  . Other secondary osteoarthritis of right foot 02/18/2022  . Alcohol use disorder, moderate, in controlled environment (*) 01/30/2022  . Chronic obstructive pulmonary disease with acute exacerbation (*) 09/14/2021  . Hallux rigidus of right foot 08/23/2021    Added automatically from request for surgery [id number]   . 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 Hallux valgus, left 08/23/2021    Added automatically from request for surgery [id number]   . Iron deficiency anemia 06/29/2021  . Severe episode of recurrent major depressive disorder, without psychotic features (*) 11/29/2020  . Abnormal LFTs 03/13/2020  . Essential hypertension, benign 07/29/2014  . Severe persistent asthma with acute exacerbation (*) 07/29/2014    Last Assessment & Plan:   Formatting of this note is different from the original.  Asthma does not appear to be optimally controlled on QVAR    Trial of Symbicort    Advised will need to change if plans for pregnancy occur.     Plan   Patient Instructions   Change QVAR  to Symbicort  2 puffs Twice daily  , rinse after use.   Begin Zyrtec 10mg  At bedtime  As needed  Drainage .   Follow up Dr. Geronimo in 6-8  weeks and As needed    Please contact office for sooner follow up if symptoms do not improve or worsen or seek emergency care   . Anxiety 03/24/2012   Allergies  Allergen Reactions  . Amlodipine  Itching  . Amlodipine  Besylate Anaphylaxis    Hypersensitivity? Tip of tongue numb and flushing.  . Oxycodone  Itching  . Penicillins Nausea And Vomiting and Rash    UNSPECIFIED REACTION  Has patient had a PCN reaction causing immediate rash, facial/tongue/throat swelling, SOB or lightheadedness with hypotension:No Has patient had a PCN reaction causing severe rash involving mucus membranes or skin necrosis:No Has patient had a PCN reaction that REACTION THAT REQUIRED HOSPITALIZATION:  #  #  #  YES  #  #  #  Has patient had a PCN reaction occurring within the last 10 years: #  #  #  YES  #  #  #   . Xanax Itching  . Prednisone  Other and Swelling    FACIAL swelling- breathing not affected, though    E-Visit Required Time Documentation  I spent 5 minutes on this issue.    *Some images could not be shown.

## 2023-09-06 ENCOUNTER — Emergency Department (HOSPITAL_COMMUNITY)

## 2023-09-06 ENCOUNTER — Encounter (HOSPITAL_COMMUNITY): Payer: Self-pay | Admitting: Emergency Medicine

## 2023-09-06 ENCOUNTER — Emergency Department (HOSPITAL_COMMUNITY)
Admission: EM | Admit: 2023-09-06 | Discharge: 2023-09-06 | Disposition: A | Discharge status: Home / Self Care | Attending: Emergency Medicine | Admitting: Emergency Medicine

## 2023-09-06 ENCOUNTER — Other Ambulatory Visit: Payer: Self-pay

## 2023-09-06 DIAGNOSIS — N12 Tubulo-interstitial nephritis, not specified as acute or chronic: Secondary | ICD-10-CM | POA: Insufficient documentation

## 2023-09-06 DIAGNOSIS — R300 Dysuria: Secondary | ICD-10-CM | POA: Diagnosis present

## 2023-09-06 LAB — URINALYSIS, ROUTINE W REFLEX MICROSCOPIC
Bilirubin Urine: NEGATIVE
Glucose, UA: NEGATIVE mg/dL
Ketones, ur: NEGATIVE mg/dL
Nitrite: NEGATIVE
Protein, ur: 30 mg/dL — AB
RBC / HPF: >50 RBC/hpf (ref 0–5)
Specific Gravity, Urine: 1.008 (ref 1.005–1.030)
WBC, UA: >50 WBC/hpf (ref 0–5)
pH: 6 (ref 5.0–8.0)

## 2023-09-06 LAB — BASIC METABOLIC PANEL WITH GFR
Anion gap: 8 (ref 5–15)
BUN: 9 mg/dL (ref 6–20)
CO2: 27 mmol/L (ref 22–32)
Calcium: 9 mg/dL (ref 8.9–10.3)
Chloride: 103 mmol/L (ref 98–111)
Creatinine, Ser: 1.1 mg/dL — ABNORMAL HIGH (ref 0.44–1.00)
GFR, Estimated: >60 mL/min
Glucose, Bld: 94 mg/dL (ref 70–99)
Potassium: 3.9 mmol/L (ref 3.5–5.1)
Sodium: 138 mmol/L (ref 135–145)

## 2023-09-06 LAB — CBC
HCT: 41.1 % (ref 36.0–46.0)
Hemoglobin: 12.9 g/dL (ref 12.0–15.0)
MCH: 29.3 pg (ref 26.0–34.0)
MCHC: 31.4 g/dL (ref 30.0–36.0)
MCV: 93.2 fL (ref 80.0–100.0)
Platelets: 317 10*3/uL (ref 150–400)
RBC: 4.41 MIL/uL (ref 3.87–5.11)
RDW: 13.8 % (ref 11.5–15.5)
WBC: 9.5 10*3/uL (ref 4.0–10.5)
nRBC: 0 % (ref 0.0–0.2)

## 2023-09-06 LAB — PREGNANCY, URINE: Preg Test, Ur: NEGATIVE

## 2023-09-06 MED ORDER — CEPHALEXIN 500 MG PO CAPS: 500.0000 mg | ORAL_CAPSULE | Freq: Four times a day (QID) | ORAL | 0 refills | Status: AC

## 2023-09-06 MED ORDER — KETOROLAC TROMETHAMINE 30 MG/ML IJ SOLN
15.0000 mg | Freq: Once | INTRAMUSCULAR | Status: AC
  Administered 2023-09-06: 15 mg via INTRAVENOUS
  Filled 2023-09-06: qty 1

## 2023-09-06 MED ORDER — CEPHALEXIN 500 MG PO CAPS: 500.0000 mg | ORAL_CAPSULE | Freq: Four times a day (QID) | ORAL | 0 refills | Status: DC

## 2023-09-06 MED ORDER — ONDANSETRON HCL 4 MG/2ML IJ SOLN
4.0000 mg | Freq: Once | INTRAMUSCULAR | Status: AC
  Administered 2023-09-06: 4 mg via INTRAVENOUS
  Filled 2023-09-06: qty 2

## 2023-09-06 NOTE — ED Provider Notes (Signed)
 [place] EMERGENCY DEPARTMENT AT [facility] Provider Note   CSN: [id number] Arrival date & time: 09/06/23  1701    History  Chief Complaint  Patient presents with   Urinary Retention   Flank Pain    [name] is a 43 y.o. female here for evaluation of left flank pain, dysuria and lower abdominal pain.  She has had similar pain to this chronically over the last few years.  Followed by urology.  Pain worsened over the last 24 hours.  She takes Information systems manager for urgency.  She is currently on her menstrual cycle.  No fever, chest pain, shortness of breath, dysuria, blood in stool.  Some nausea without vomiting.  She was seen by telehealth yesterday who started her on Macrobid.  She is frustrated that she has been trying dietary changes and medications without any relief.  No recent falls or injuries.  No pain or swelling to lower extremities, history of PE or DVT.  Associated urinary urgency, dysuria.  Follows with Urology ([facility]) for OAB    HPI     Home Medications Prior to Admission medications   Medication Sig Start Date End Date Taking? Authorizing Provider  cephALEXin  (KEFLEX ) 500 MG capsule Take 1 capsule (500 mg total) by mouth 4 (four) times daily for 7 days. 09/06/23 09/13/23 Yes Lynisha Osuch A, PA-C  ADVAIR  HFA 230-21 MCG/ACT inhaler Inhale 2 puffs into the lungs 2 (two) times daily. **brand per insurance** 08/19/23   Aleck Hurdle, MD  albuterol  (PROVENTIL ) (2.5 MG/3ML) 0.083% nebulizer solution Take 3 mLs (2.5 mg total) by nebulization every 6 (six) hours as needed for wheezing or shortness of breath. 07/18/23   Cobb, Mariah Shines, NP  albuterol  (VENTOLIN  HFA) 108 (90 Base) MCG/ACT inhaler Inhale 2 puffs into the lungs every 6 (six) hours as needed for wheezing or shortness of breath. 03/26/23   Kingsley, Victoria K, DO  Azelastine -Fluticasone  137-50 MCG/ACT SUSP Place 1 spray into the nose in the morning and at bedtime. 08/19/23   Aleck Hurdle, MD   busPIRone  (BUSPAR ) 10 MG tablet Take 10 mg by mouth 3 (three) times daily. 08/11/21   [provider]  dicyclomine  (BENTYL ) 10 MG capsule Take 10 mg by mouth 3 (three) times daily before meals.    [provider]  Dupilumab  (DUPIXENT ) 300 MG/2ML SOAJ Inject 300 mg into the skin every 14 (fourteen) days. 07/30/23   Aleck Hurdle, MD  folic acid  (FOLVITE ) 1 MG tablet Take 1 mg by mouth daily. 07/16/23   [provider]  gabapentin  (NEURONTIN ) 300 MG capsule Take 300 mg by mouth 3 (three) times daily. 06/17/23   [provider]  hydrOXYzine  (VISTARIL ) 25 MG capsule Take 1 capsule (25 mg total) by mouth 3 (three) times daily as needed for anxiety. 06/22/22   Verlyn Goad, MD  loratadine  (CLARITIN ) 10 MG tablet Take 1 tablet (10 mg total) by mouth daily. 06/26/23 09/24/23  Cobb, Mariah Shines, NP  losartan  (COZAAR ) 50 MG tablet Take 50 mg by mouth daily.    [provider]  montelukast  (SINGULAIR ) 10 MG tablet Take 1 tablet (10 mg total) by mouth daily. 08/19/23   Aleck Hurdle, MD  naltrexone  (DEPADE) 50 MG tablet Take 50 mg by mouth daily.    [provider]  nicotine  (NICODERM CQ  - DOSED IN MG/24 HOURS) 21 mg/24hr patch Place 1 patch (21 mg total) onto the skin daily. 06/26/23   Roetta Clarke, NP  OLANZapine  (ZYPREXA )  7.5 MG tablet Take 7.5 mg by mouth at bedtime.    [provider]  Oxcarbazepine (TRILEPTAL) 300 MG tablet Take 300 mg by mouth 2 (two) times daily.    [provider]  pantoprazole  (PROTONIX ) 40 MG tablet Take 1 tablet (40 mg total) by mouth daily. 08/19/23   Desai, Nikita S, MD      Allergies    Alprazolam, Oxycodone , Penicillins, and Norvasc  [amlodipine  besylate]    Review of Systems   Review of Systems  Constitutional: Negative.   HENT: Negative.    Respiratory: Negative.    Cardiovascular: Negative.   Gastrointestinal:  Positive for abdominal pain and nausea. Negative for diarrhea and vomiting.   Genitourinary:  Positive for decreased urine volume, difficulty urinating, dysuria and flank pain. Negative for hematuria, menstrual problem, pelvic pain, urgency, vaginal bleeding, vaginal discharge and vaginal pain.  Skin: Negative.   Neurological: Negative.   All other systems reviewed and are negative.   Physical Exam Updated Vital Signs BP (!) 128/90   Pulse 82   Temp 97.8 F (36.6 C) (Oral)   Resp 18   Ht 5\' 4"  (1.626 m)   Wt 94.3 kg   LMP 09/02/2023 (Exact Date)   SpO2 94%   BMI 35.70 kg/m  Physical Exam Vitals and nursing note reviewed.  Constitutional:      General: She is not in acute distress.    Appearance: She is well-developed. She is not ill-appearing, toxic-appearing or diaphoretic.  HENT:     Head: Atraumatic.  Eyes:     Pupils: Pupils are equal, round, and reactive to light.  Cardiovascular:     Rate and Rhythm: Normal rate.     Pulses: Normal pulses.          Radial pulses are 2+ on the right side and 2+ on the left side.     Heart sounds: Normal heart sounds.  Pulmonary:     Effort: Pulmonary effort is normal. No respiratory distress.     Breath sounds: Normal breath sounds.  Abdominal:     General: Bowel sounds are normal. There is no distension.     Palpations: There is no mass.     Tenderness: There is abdominal tenderness. There is left CVA tenderness. There is no right CVA tenderness, guarding or rebound.     Hernia: No hernia is present.  Musculoskeletal:        General: Normal range of motion.     Cervical back: Normal range of motion.     Right lower leg: No edema.     Left lower leg: No edema.  Skin:    General: Skin is warm and dry.     Capillary Refill: Capillary refill takes less than 2 seconds.  Neurological:     General: No focal deficit present.     Mental Status: She is alert.  Psychiatric:        Mood and Affect: Mood normal.     ED Results / Procedures / Treatments   Labs (all labs ordered are listed, but only abnormal  results are displayed) Labs Reviewed  URINALYSIS, ROUTINE W REFLEX MICROSCOPIC - Abnormal; Notable for the following components:      Result Value   Hgb urine dipstick LARGE (*)    Protein, ur 30 (*)    Leukocytes,Ua TRACE (*)    Bacteria, UA RARE (*)    All other components within normal limits  BASIC METABOLIC PANEL WITH GFR - Abnormal; Notable for the following  components:   Creatinine, Ser 1.10 (*)    All other components within normal limits  URINE CULTURE  PREGNANCY, URINE  CBC    EKG None  Radiology CT Renal Stone Study Result Date: 09/06/2023 CLINICAL DATA:  Left flank pain. EXAM: CT ABDOMEN AND PELVIS WITHOUT CONTRAST TECHNIQUE: Multidetector CT imaging of the abdomen and pelvis was performed following the standard protocol without IV contrast. RADIATION DOSE REDUCTION: This exam was performed according to the departmental dose-optimization program which includes automated exposure control, adjustment of the mA and/or kV according to patient size and/or use of iterative reconstruction technique. COMPARISON:  Oct 16, 2022 FINDINGS: Lower chest: No acute abnormality. Hepatobiliary: No focal liver abnormality is seen. Status post cholecystectomy. No biliary dilatation. Pancreas: Unremarkable. No pancreatic ductal dilatation or surrounding inflammatory changes. Spleen: Normal in size without focal abnormality. Adrenals/Urinary Tract: Adrenal glands are unremarkable. There is mild right renal atrophy and posterior renal cortical scarring. The left kidney is normal in size. There is no evidence of obstructing renal calculi, focal lesion, or hydronephrosis. A punctate nonobstructing renal calculus is seen within the left kidney. Bladder is unremarkable. Stomach/Bowel: Stomach is within normal limits. Appendix appears normal. No evidence of bowel wall thickening, distention, or inflammatory changes. Vascular/Lymphatic: No significant vascular findings are present. No enlarged abdominal or  pelvic lymph nodes. Reproductive: Uterus and bilateral adnexa are unremarkable. Other: No abdominal wall hernia or abnormality. No abdominopelvic ascites. Musculoskeletal: No acute or significant osseous findings. IMPRESSION: 1. Punctate nonobstructing left renal calculus. 2. Evidence of prior cholecystectomy. Electronically Signed   By: Virgle Grime M.D.   On: 09/06/2023 19:49    Procedures Procedures    Medications Ordered in ED Medications  ondansetron  (ZOFRAN ) injection 4 mg (4 mg Intravenous Given 09/06/23 2000)  ketorolac  (TORADOL ) 30 MG/ML injection 15 mg (15 mg Intravenous Given 09/06/23 2002)    ED Course/ Medical Decision Making/ A&P   43 year old here for evaluation of left flank pain, dysuria.  Sounds like this is an ongoing issue for the patient however worse in the last few days.  Seen on telehealth visit who started her on Macrobid.  She is currently on her menstrual cycle.  She has no chest pain, shortness of breath she has no clinical evidence of VTE.  She is PERC negative, low suspicion for PE as cause of her flank pain.  No URI symptoms to suggest pneumonia, pneumothorax, anginal symptoms.  No change in stool, no blood in stool.  Will plan on labs, imaging results  Labs and imaging personally viewed and interpreted:  UA large blood, trace leuks, rare bacteria, greater than 50 WBC but otherwise clean-catch--sent for culture CBC without significant abnormality BMP creatinine 1.1--similar to prior Preg neg CT renal stone without significant abnormality  Patient reassessed.  We discussed her labs and imaging.  Urine sent for culture.  Given she has some symptoms of greater than 50 WBC we will treat with Keflex .  Does show allergy to penicillins however per chart review she has tolerated Rocephin  and Keflex  previously without difficulty.  I also referred her to a new urologist as she requested.  Will have her follow-up outpatient, return for any worsening symptoms  On  repeat exam patient does not have a surgical abdomin and there are no peritoneal signs.  No indication of appendicitis, bowel obstruction, bowel perforation, cholecystitis, diverticulitis, PID, intermittent/persistent torsion, infected kidney stone or ectopic pregnancy, PE, dissection, pneumothorax, pneumonia.  Patient discharged home with symptomatic treatment and given strict instructions for follow-up  with their primary care physician.  I have also discussed reasons to return immediately to the ER.  Patient expresses understanding and agrees with plan.                                 Medical Decision Making Amount and/or Complexity of Data Reviewed External Data Reviewed: labs, radiology and notes. Labs: ordered. Decision-making details documented in ED Course. Radiology: ordered and independent interpretation performed. Decision-making details documented in ED Course.  Risk OTC drugs. Prescription drug management. Parenteral controlled substances. Decision regarding hospitalization. Diagnosis or treatment significantly limited by social determinants of health.          Final Clinical Impression(s) / ED Diagnoses Final diagnoses:  Pyelonephritis    Rx / DC Orders ED Discharge Orders          Ordered    cephALEXin  (KEFLEX ) 500 MG capsule  4 times daily        09/06/23 2019              Julias Mould A, PA-C 09/06/23 2022    Trish Furl, MD 09/07/23 1506

## 2023-09-06 NOTE — Discharge Instructions (Signed)
 It was a pleasure taking care of you here in the emergency department  We have sent your urine for culture.  I am prescribing you Keflex  a medication for infection your kidney.  Stop taking the Macrobid prescribed from your visit yesterday. I have also placed the number to another urologist in your discharge paperwork  Make sure to follow-up outpatient, return for new or worsening symptoms

## 2023-09-06 NOTE — ED Triage Notes (Signed)
 Pt via POV c/o left flank pain, dysuria, and lower abdominal pain x 3 days with nitrofurantoin x 2 days. Prior hx kidney stones with similar presentation. Pain rated 10/10 constant  with nausea.

## 2023-09-08 LAB — URINE CULTURE

## 2023-09-16 ENCOUNTER — Encounter: Payer: Self-pay | Admitting: Internal Medicine

## 2023-09-30 ENCOUNTER — Other Ambulatory Visit: Payer: Self-pay

## 2023-09-30 ENCOUNTER — Other Ambulatory Visit: Payer: Self-pay | Admitting: Pharmacy Technician

## 2023-09-30 NOTE — Progress Notes (Signed)
 Specialty Pharmacy Refill Coordination Note  [name] is a 43 y.o. female contacted today regarding refills of specialty medication(s) Dupilumab  (Dupixent )   Patient requested Delivery   Delivery date: 10/07/23   Verified address: [address] [place] [address]   Medication will be filled on 10/06/23.

## 2023-10-02 ENCOUNTER — Ambulatory Visit: Payer: Self-pay | Admitting: Internal Medicine

## 2023-10-02 NOTE — Telephone Encounter (Signed)
 E2C2 Pulmonary Triage - Initial Assessment Questions "Chief Complaint (e.g., cough, sob, wheezing, fever, chills, sweat or additional symptoms) *Go to specific symptom protocol after initial questions. Patient reports shortness of breath that started yesterday afternoon. Small dry cough, no congestion, no chest pain, no fever, chills. Does endorse wheezing-not audible on the phone. Patient reports using her inhalers and nebulizer. Patient is questioning if she can switch back to Pulmicort  instead of Advair . Patient reports she feels like the Advair  isn't helping her the way the Pulmicort  did. Patient is asking for a phone call back from pulmonary office.  "How long have symptoms been present?" Started yesterday afternoon  Have you tested for COVID or Flu? Note: If not, ask patient if a home test can be taken. If so, instruct patient to call back for positive results. No  MEDICINES:   "Have you used any OTC meds to help with symptoms?" Yes If yes, ask "What medications?" Allergy medication  "Have you used your inhalers/maintenance medication?" Yes If yes, "What medications?" Advair  Albuterol  inhaler and nebulizer Spriva  If inhaler, ask "How many puffs and how often?" Note: Review instructions on medication in the chart. Advair  2 puff BID  Albuterol  2 puffs q6hs PRN  OXYGEN: "Do you wear supplemental oxygen?" No If yes, "How many liters are you supposed to use?" Has it home but hasn't had to use it recently  "Do you monitor your oxygen levels?" Yes If yes, "What is your reading (oxygen level) today?" 95% RA  "What is your usual oxygen saturation reading?"  (Note: Pulmonary O2 sats should be 90% or greater) 94-98%   Copied from CRM #[id number]. Topic: Clinical - Red Word Triage >> Oct 02, 2023  2:40 PM Margarette Shawl wrote: Red Word that prompted transfer to Nurse Triage:   Symptoms just started yesterday and today Used albuterol  inhaler, not helping Having SOB today, triggered by  fragrances Has been on Dupixent  for 1 month, has not had issues until today. No chest pain No congestion Trying to see if Dione Franks can call in refill of Pulmicort  Reason for Disposition  [1] Longstanding difficulty breathing AND [2] not responding to usual therapy  Answer Assessment - Initial Assessment Questions 1. RESPIRATORY STATUS: "Describe your breathing?" (e.g., wheezing, shortness of breath, unable to speak, severe coughing)      Shortness of breath 2. ONSET: "When did this breathing problem begin?"      Started yesterday 3. PATTERN "Does the difficult breathing come and go, or has it been constant since it started?"      constant 4. SEVERITY: "How bad is your breathing?" (e.g., mild, moderate, severe)    - MILD: No SOB at rest, mild SOB with walking, speaks normally in sentences, can lie down, no retractions, pulse < 100.    - MODERATE: SOB at rest, SOB with minimal exertion and prefers to sit, cannot lie down flat, speaks in phrases, mild retractions, audible wheezing, pulse 100-120.    - SEVERE: Very SOB at rest, speaks in single words, struggling to breathe, sitting hunched forward, retractions, pulse > 120      Mild 5. RECURRENT SYMPTOM: "Have you had difficulty breathing before?" If Yes, ask: "When was the last time?" and "What happened that time?"      yes 6. CARDIAC HISTORY: "Do you have any history of heart disease?" (e.g., heart attack, angina, bypass surgery, angioplasty)       7. LUNG HISTORY: "Do you have any history of lung disease?"  (e.g., pulmonary embolus, asthma,  emphysema)     COPD  Protocols used: Breathing Difficulty-A-AH

## 2023-10-03 ENCOUNTER — Encounter: Payer: Self-pay | Admitting: Internal Medicine

## 2023-10-03 MED ORDER — SPIRIVA RESPIMAT 1.25 MCG/ACT IN AERS
2.0000 | INHALATION_SPRAY | Freq: Every day | RESPIRATORY_TRACT | 5 refills | Status: AC
Start: 2023-10-03 — End: ?

## 2023-10-03 NOTE — Telephone Encounter (Signed)
 Pulmicort  alone would not be enough steroid medication for her.  She needs to continue advair  230 2 puffs twice daily, gargle after use.  Resume spiriva  2 puffs once daily - sent to pharmacy.   Continue albuterol  inhaler 2 puffs up to 6 times a day for wheezing, chest tightness, shortness of breath.   Continue dupixent  injection.  Can add pulmicort  nebulizer therapy 0.25mg  BID addition to the above  for when she is feeling sick, such as now. And would make sure she has albuterol  nebs to take as well - nebs may be more effective than inhaler for her.   Recommend appointment sooner for follow up.

## 2023-10-03 NOTE — Addendum Note (Signed)
 Addended by: Johnella Crumm on: 10/03/2023 09:54 AM   Modules accepted: Orders

## 2023-10-03 NOTE — Telephone Encounter (Signed)
**Note De-identified  [place] Obfuscation** Please advise 

## 2023-10-06 MED ORDER — BUDESONIDE 0.25 MG/2ML IN SUSP
0.2500 mg | Freq: Two times a day (BID) | RESPIRATORY_TRACT | 6 refills | Status: DC
Start: 2023-10-06 — End: 2024-01-01

## 2023-10-06 NOTE — Addendum Note (Signed)
 Addended byGregory Leash, Cayson Kalb A on: 10/06/2023 08:35 AM   Modules accepted: Orders

## 2023-10-06 NOTE — Telephone Encounter (Signed)
 Spoke with Dniyah regarding ND note. Pt scheduled ov. Pulmicort  neb sent to pharm & pt is aware and verbalized understanding. Nothing further needed.

## 2023-10-07 ENCOUNTER — Ambulatory Visit (INDEPENDENT_AMBULATORY_CARE_PROVIDER_SITE_OTHER): Admitting: Internal Medicine

## 2023-10-07 ENCOUNTER — Encounter: Payer: Self-pay | Admitting: Internal Medicine

## 2023-10-07 VITALS — BP 133/90 | HR 96 | Temp 98.5°F | Ht 64.0 in | Wt 208.0 lb

## 2023-10-07 DIAGNOSIS — K219 Gastro-esophageal reflux disease without esophagitis: Secondary | ICD-10-CM | POA: Diagnosis not present

## 2023-10-07 DIAGNOSIS — J4489 Other specified chronic obstructive pulmonary disease: Secondary | ICD-10-CM

## 2023-10-07 DIAGNOSIS — J301 Allergic rhinitis due to pollen: Secondary | ICD-10-CM | POA: Diagnosis not present

## 2023-10-07 MED ORDER — LEVOCETIRIZINE DIHYDROCHLORIDE 5 MG PO TABS: 5.0000 mg | ORAL_TABLET | Freq: Every evening | ORAL | 3 refills | Status: AC

## 2023-10-07 NOTE — Progress Notes (Signed)
 [name]    [id number]    1981-03-30  Primary Care Physician:Keatts, Jearlean Mince, NP Date of Appointment: 10/07/2023 Established Patient Visit  Chief complaint:   Chief Complaint  Patient presents with   Follow-up    Asthma follow- up    HPI: Poorly controlled Moderate persistent asthma with copd overlap syndrome with ongoing tobacco use disorder.  Hospitalized April 2023, Nov 2023, and Jan 2024 for copd exacerbation. Has stopped wearing oxygen. Saw [facility] for recurrent asthma exacerbations - started on dupixent  March 2025  Interval Updates: Here for follow up visit. Having worsening shortness of breath, chest tightness wheezing. Feels the weeks she takes dupixent  she feels good. The off weeks she doesn't feel well.   Feels nebs help more than inhaler. Taking albuterol  nebs twice a day. Has a new machine but feels her home machine isn't as effective as the one in the office.    Has cut back on smoking down to 3-4/day. She wants to quit for good and she feels like she is close.   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 She is on medication. She was doing talk therapy. Went once.   Albuterol  use is mostly once daily before bed.  Current Regimen: advair  230 2 puffs a day prn albuterol  neb, spiriva  2 puffs once daily.  Asthma Triggers: cold weather/hot weather, animals, cigarettes, anxiety, scents Exacerbations in the last year: one History of hospitalization or intubation: yes, 3 times. Allergy Testing: never had.  GERD: yes controlled on PPI Allergic Rhinitis:yes on dymista , montelukast , claritin  ACT:  Asthma Control Test ACT Total Score  08/19/2023 10:40 AM 12  03/27/2021  3:53 PM 12  07/15/2019  4:04 PM 11   FeNO: n/a  I have reviewed the patient's family social and past medical history and updated as appropriate.   Past Medical History:  Diagnosis Date   Anxiety    Asthma    Depression    doing good, not on meds now    GERD (gastroesophageal reflux disease)    Headache    migraines   History of kidney stones    Hypertension    Kidney stones    Missed abortion 10/02/2013   Ovarian cyst    Pregnancy induced hypertension    Suicidal intent    Vaginal Pap smear, abnormal    bx, f/u ok    Past Surgical History:  Procedure Laterality Date   CESAREAN SECTION     CESAREAN SECTION N/A 06/06/2015   Procedure: CESAREAN SECTION;  Surgeon: Heide Livings, MD;  Location: [facility];  Service: Obstetrics;  Laterality: N/A;   CHOLECYSTECTOMY N/A 08/23/2016   Procedure: LAPAROSCOPIC CHOLECYSTECTOMY;  Surgeon: Adalberto Acton, MD;  Location: [facility];  Service: General;  Laterality: N/A;   DILATION AND CURETTAGE OF UTERUS     DILATION AND EVACUATION N/A 10/04/2013   Procedure: DILATATION AND EVACUATION;  Surgeon: Margaretmary Shaver, MD;  Location: [facility];  Service: Gynecology;  Laterality: N/A;  US  in room    TONSILLECTOMY     TUBAL LIGATION      Family History  Problem Relation Age of Onset   Hearing loss Mother    Asthma Mother    Asthma Father    Diabetes Son    Cancer Maternal Grandfather        bone   Liver disease Maternal Grandfather    Asthma Sister    Lupus Sister  Social History   Occupational History   Not on file  Tobacco Use   Smoking status: Former    Current packs/day: 1.00    Average packs/day: 1 pack/day for 15.0 years (15.0 ttl pk-yrs)    Types: Cigarettes   Smokeless tobacco: Never   Tobacco comments:    Quit smoking 10/06/23   Vaping Use   Vaping status: Never Used  Substance and Sexual Activity   Alcohol use: Yes    Alcohol/week: 4.0 standard drinks of alcohol    Types: 4 Cans of beer per week   Drug use: No   Sexual activity: Yes    Partners: Male    Birth control/protection: Surgical    Physical Exam: Blood pressure (!) 133/90, pulse 96, temperature 98.5 F (36.9 C), height 5\' 4"  (1.626 m), weight 208 lb (94.3 kg), SpO2 93%.  Gen: well nourished well appearing,  no distress, fatigued CV: RRR no mrg Resp: ctab no wheezes or crackles, good symmetric air entry bilaterally.   Data Reviewed: Imaging: I have personally reviewed the chest xray feb 2025 no acute cardiopulmonary process  PFTs:     Latest Ref Rng & Units 11/16/2015   12:47 PM  PFT Results  FVC-Pre L 3.54   FVC-Predicted Pre % 94   Pre FEV1/FVC % % 55   FEV1-Pre L 1.96   FEV1-Predicted Pre % 62   DLCO uncorrected ml/min/mmHg 23.29   DLCO UNC% % 95   DLVA Predicted % 97   TLC L 8.11   TLC % Predicted % 160   RV % Predicted % 292    I have personally reviewed the patient's PFTs and which demonstrate moderate airflow limitation   Labs: Lab Results  Component Value Date   WBC 9.5 09/06/2023   HGB 12.9 09/06/2023   HCT 41.1 09/06/2023   MCV 93.2 09/06/2023   PLT 317 09/06/2023   Lab Results  Component Value Date   NA 138 09/06/2023   K 3.9 09/06/2023   CL 103 09/06/2023   CO2 27 09/06/2023    Immunization status: Immunization History  Administered Date(s) Administered   DT (Pediatric) 08/19/1984, 07/29/1985, 07/24/1987   DTP 03/28/1983   Hep B, Unspecified 02/26/1995, 04/03/1995, 09/03/1995   Influenza Inj Mdck Quad Pf 04/04/2021, 02/19/2022   Influenza, Mdck, Trivalent,PF 6+ MOS(egg free) 04/30/2023   Influenza,inj,Quad PF,6+ Mos 07/29/2014, 06/07/2015, 07/21/2017, 02/05/2018, 03/03/2019, 05/25/2022   Influenza-Unspecified 03/24/2012, 07/29/2014, 06/07/2015, 07/21/2017, 02/05/2018, 03/03/2019   MMR 06/19/1987   OPV 08/19/1984, 07/29/1985, 06/19/1987   PNEUMOCOCCAL CONJUGATE-20 01/04/2023   Pneumococcal Polysaccharide-23 07/29/2014, 06/08/2015   Pneumococcal-Unspecified 07/29/2014, 06/08/2015   Tdap 11/20/2011, 06/07/2015    Assessment:  Severe Asthma COPD overlap syndrome, moderate FEV1 62% of predicted with progressive symptoms Tobacco use disorder- QUIT YESTERDAY GERD, CONTROLLED Excessive daytime sleepiness - sleep study deferred per patient.    Plan/Recommendations:  For the asthma Continue the Dupixent  injections. She needs to continue advair  230 2 puffs twice daily, gargle after use.  Continue spiriva  2 puffs once daily Adding pulmicort  nebulizer therapy 0.25mg  BID addition to the above for when you is feeling sick, such as now.  Continue albuterol  inhaler 2 puffs (or 1 neb) up to 6 times a day for wheezing, chest tightness, shortness of breath.    Remember pursed lip breathing to gain control of your breath.  Smell the roses, blow out the candles.  For reflux Continue the pantoprazole , I have refilled this  For allergies Stop claritin  Switch to xyzal.  Congratulations on quitting  smoking! You got this!   Return to Care: Return in about 3 months (around 01/07/2024).   Louie Rover, MD Pulmonary and Critical Care Medicine [facility] [phone]

## 2023-10-07 NOTE — Patient Instructions (Signed)
 It was a pleasure to see you today!  Please schedule follow up with myself in 3 months.  If my schedule is not open yet, we will contact you with a reminder closer to that time. Please call [phone] if you haven't heard from us  a month before, and always call us  sooner if issues or concerns arise. You can also send us  a message through MyChart, but but aware that this is not to be used for urgent issues and it may take up to 5-7 days to receive a reply. Please be aware that you will likely be able to view your results before I have a chance to respond to them. Please give us  5 business days to respond to any non-urgent results.    For the asthma Continue the Dupixent  injections. She needs to continue advair  230 2 puffs twice daily, gargle after use.  Continue spiriva  2 puffs once daily Adding pulmicort  nebulizer therapy 0.25mg  BID addition to the above for when you is feeling sick, such as now.  Continue albuterol  inhaler 2 puffs (or 1 neb) up to 6 times a day for wheezing, chest tightness, shortness of breath.    Remember pursed lip breathing to gain control of your breath.  Smell the roses, blow out the candles.  For reflux Continue the pantoprazole , I have refilled this  For allergies Stop claritin  Switch to xyzal.  Congratulations on quitting smoking! You got this!

## 2023-11-03 ENCOUNTER — Other Ambulatory Visit: Payer: Self-pay | Admitting: Pharmacy Technician

## 2023-11-03 ENCOUNTER — Other Ambulatory Visit: Payer: Self-pay

## 2023-11-03 NOTE — Progress Notes (Signed)
 Specialty Pharmacy Refill Coordination Note  [name] is a 43 y.o. female contacted today regarding refills of specialty medication(s) Dupilumab  (Dupixent )   Patient requested Delivery   Delivery date: 11/05/23   Verified address: [address] [place] [address]   Medication will be filled on 11/04/23.

## 2023-11-04 ENCOUNTER — Other Ambulatory Visit: Payer: Self-pay

## 2023-11-28 ENCOUNTER — Other Ambulatory Visit: Payer: Self-pay

## 2023-11-28 ENCOUNTER — Encounter (INDEPENDENT_AMBULATORY_CARE_PROVIDER_SITE_OTHER): Payer: Self-pay

## 2023-12-01 ENCOUNTER — Other Ambulatory Visit: Payer: Self-pay

## 2023-12-01 NOTE — Progress Notes (Signed)
 Specialty Pharmacy Refill Coordination Note  [name] is a 43 y.o. female contacted today regarding refills of specialty medication(s) Dupilumab  (Dupixent )   Patient requested Delivery   Delivery date: 12/03/23   Verified address: [address] [place] [address]   Medication will be filled on 12/02/23.

## 2023-12-10 NOTE — Addendum Note (Signed)
 Addended by: DAYNE SHERRY RAMAN on: 12/10/2023 09:46 AM   Modules accepted: Level of Service

## 2023-12-11 ENCOUNTER — Other Ambulatory Visit: Payer: Self-pay

## 2023-12-18 ENCOUNTER — Other Ambulatory Visit: Payer: Self-pay | Admitting: Urology

## 2023-12-18 NOTE — Progress Notes (Signed)
 Please place orders for PAT appointment scheduled 12/19/23

## 2023-12-18 NOTE — Progress Notes (Signed)
 COVID Vaccine Completed:  Date of COVID positive in last 90 days:  PCP - Charmaine Heller, NP Cardiologist - Marcello Lennox, MD LOV 07/01/23 Pulmonologist- Verdon Gore, MD  Chest x-ray - 06/26/23 Epic EKG - 03/31/23 Epic Stress Test - 07/14/23 [facility] ECHO - 05/26/22 Epic Cardiac Cath - n/a Pacemaker/ICD device last checked: n/a Spinal Cord Stimulator:n/a  Bowel Prep - no  Sleep Study - yes CPAP - not decided yet,   Fasting Blood Sugar - n/a Checks Blood Sugar _____ times a day  Last dose of GLP1 agonist-  Wegovy, takes Wednesdays GLP1 instructions:  Do not take after  12/15/23. Last dose 12/10/23.   Last dose of SGLT-2 inhibitors-  N/A SGLT-2 instructions:  Do not take after     Blood Thinner Instructions:  Last dose: n/a  Time: Aspirin Instructions: Last Dose:  Activity level: Can go up a flight of stairs and perform activities of daily living without stopping and without symptoms of chest pain. Mild SOB, not new or changing per pt  Anesthesia review: during for surgery 04/03/22 patient had O2 sats drop and HR elevated. Had to be admitted. See anesthesia note 04/03/22 in [facility]. Has O2 PRN, has not used in past 3 months  Patient denies shortness of breath, fever, cough and chest pain at PAT appointment  Patient verbalized understanding of instructions that were given to them at the PAT appointment. Patient was also instructed that they will need to review over the PAT instructions again at home before surgery.

## 2023-12-18 NOTE — Patient Instructions (Signed)
 SURGICAL WAITING ROOM VISITATION  Patients having surgery or a procedure may have no more than 2 support people in the waiting area - these visitors may rotate.    Children under the age of 43 must have an adult with them who is not the patient.  Visitors with respiratory illnesses are discouraged from visiting and should remain at home.  If the patient needs to stay at the hospital during part of their recovery, the visitor guidelines for inpatient rooms apply. Pre-op nurse will coordinate an appropriate time for 1 support person to accompany patient in pre-op.  This support person may not rotate.    Please refer to the [facility] website for the visitor guidelines for Inpatients (after your surgery is over and you are in a regular room).    Your procedure is scheduled on: 12/23/23   Report to [facility] Main Entrance    Report to admitting at 9:45 AM   Call this number if you have problems the morning of surgery [phone]   Do not eat food or drink liquids:After Midnight.          If you have questions, please contact your surgeon's office.   FOLLOW BOWEL PREP AND ANY ADDITIONAL PRE OP INSTRUCTIONS YOU RECEIVED FROM YOUR SURGEON'S OFFICE!!!     Oral Hygiene is also important to reduce your risk of infection.                                    Remember - BRUSH YOUR TEETH THE MORNING OF SURGERY WITH YOUR REGULAR TOOTHPASTE  DENTURES WILL BE REMOVED PRIOR TO SURGERY PLEASE DO NOT APPLY Poly grip OR ADHESIVES!!!   Do NOT smoke after Midnight   Stop all vitamins and herbal supplements 7 days before surgery.   Do not take [facility] after 12/15/23   Take these medicines the morning of surgery with A SIP OF WATER: Inhalers, Buspirone , Clonazepam , Gabapentin , Oxtellar, Pantoprazole , Trintellix                               You may not have any metal on your body including hair pins, jewelry, and body piercing             Do not wear make-up, lotions, powders, perfumes,  or deodorant  Do not wear nail polish including gel and S&S, artificial/acrylic nails, or any other type of covering on natural nails including finger and toenails. If you have artificial nails, gel coating, etc. that needs to be removed by a nail salon please have this removed prior to surgery or surgery may need to be canceled/ delayed if the surgeon/ anesthesia feels like they are unable to be safely monitored.   Do not shave  48 hours prior to surgery.    Do not bring valuables to the hospital. [facility] IS NOT             RESPONSIBLE   FOR VALUABLES.   Contacts, glasses, dentures or bridgework may not be worn into surgery.  DO NOT BRING YOUR HOME MEDICATIONS TO THE HOSPITAL. PHARMACY WILL DISPENSE MEDICATIONS LISTED ON YOUR MEDICATION LIST TO YOU DURING YOUR ADMISSION IN THE HOSPITAL!    Patients discharged on the day of surgery will not be allowed to drive home.  Someone NEEDS to stay with you for the first 24 hours after anesthesia.  Special Instructions: Bring a copy of your healthcare power of attorney and living will documents the day of surgery if you haven't scanned them before.              Please read over the following fact sheets you were given: IF YOU HAVE QUESTIONS ABOUT YOUR PRE-OP INSTRUCTIONS PLEASE CALL [phone].   If you received a COVID test during your pre-op visit  it is requested that you wear a mask when out in public, stay away from anyone that may not be feeling well and notify your surgeon if you develop symptoms. If you test positive for Covid or have been in contact with anyone that has tested positive in the last 10 days please notify you surgeon.    [facility] - Preparing for Surgery Before surgery, you can play an important role.  Because skin is not sterile, your skin needs to be as free of germs as possible.  You can reduce the number of germs on your skin by washing with CHG (chlorahexidine gluconate) soap before surgery.  CHG is an  antiseptic cleaner which kills germs and bonds with the skin to continue killing germs even after washing. Please DO NOT use if you have an allergy to CHG or antibacterial soaps.  If your skin becomes reddened/irritated stop using the CHG and inform your nurse when you arrive at Short Stay. Do not shave (including legs and underarms) for at least 48 hours prior to the first CHG shower.  You may shave your face/neck.  Please follow these instructions carefully:  1.  Shower with CHG Soap the night before surgery and the  morning of surgery.  2.  If you choose to wash your hair, wash your hair first as usual with your normal  shampoo.  3.  After you shampoo, rinse your hair and body thoroughly to remove the shampoo.                             4.  Use CHG as you would any other liquid soap.  You can apply chg directly to the skin and wash.  Gently with a scrungie or clean washcloth.  5.  Apply the CHG Soap to your body ONLY FROM THE NECK DOWN.   Do   not use on face/ open                           Wound or open sores. Avoid contact with eyes, ears mouth and   genitals (private parts).                       Wash face,  Genitals (private parts) with your normal soap.             6.  Wash thoroughly, paying special attention to the area where your    surgery  will be performed.  7.  Thoroughly rinse your body with warm water from the neck down.  8.  DO NOT shower/wash with your normal soap after using and rinsing off the CHG Soap.                9.  Pat yourself dry with a clean towel.            10.  Wear clean pajamas.            11.  Place clean sheets  on your bed the night of your first shower and do not  sleep with pets. Day of Surgery : Do not apply any lotions/deodorants the morning of surgery.  Please wear clean clothes to the hospital/surgery center.  FAILURE TO FOLLOW THESE INSTRUCTIONS MAY RESULT IN THE CANCELLATION OF YOUR SURGERY  PATIENT  SIGNATURE_________________________________  NURSE SIGNATURE__________________________________  ________________________________________________________________________

## 2023-12-19 ENCOUNTER — Encounter (HOSPITAL_COMMUNITY): Payer: Self-pay

## 2023-12-19 ENCOUNTER — Other Ambulatory Visit: Payer: Self-pay

## 2023-12-19 ENCOUNTER — Encounter (HOSPITAL_COMMUNITY)
Admission: RE | Admit: 2023-12-19 | Discharge: 2023-12-19 | Disposition: A | Discharge status: Home / Self Care | Source: Ambulatory Visit | Attending: Family Medicine | Admitting: Family Medicine

## 2023-12-19 VITALS — BP 129/87 | HR 91 | Temp 98.1°F | Resp 14 | Ht 63.0 in | Wt 204.0 lb

## 2023-12-19 DIAGNOSIS — I10 Essential (primary) hypertension: Secondary | ICD-10-CM | POA: Insufficient documentation

## 2023-12-19 DIAGNOSIS — J449 Chronic obstructive pulmonary disease, unspecified: Secondary | ICD-10-CM | POA: Diagnosis not present

## 2023-12-19 DIAGNOSIS — Z87891 Personal history of nicotine dependence: Secondary | ICD-10-CM | POA: Diagnosis not present

## 2023-12-19 DIAGNOSIS — J4540 Moderate persistent asthma, uncomplicated: Secondary | ICD-10-CM | POA: Diagnosis not present

## 2023-12-19 DIAGNOSIS — R102 Pelvic and perineal pain: Secondary | ICD-10-CM | POA: Diagnosis not present

## 2023-12-19 DIAGNOSIS — Z79899 Other long term (current) drug therapy: Secondary | ICD-10-CM | POA: Diagnosis not present

## 2023-12-19 DIAGNOSIS — Z01818 Encounter for other preprocedural examination: Secondary | ICD-10-CM | POA: Diagnosis present

## 2023-12-19 DIAGNOSIS — Z01812 Encounter for preprocedural laboratory examination: Secondary | ICD-10-CM | POA: Insufficient documentation

## 2023-12-19 HISTORY — DX: Pneumonia, unspecified organism: J18.9

## 2023-12-19 HISTORY — DX: Chronic obstructive pulmonary disease, unspecified: J44.9

## 2023-12-19 HISTORY — DX: Other complications of anesthesia, initial encounter: T88.59XA

## 2023-12-19 HISTORY — DX: Anemia, unspecified: D64.9

## 2023-12-19 HISTORY — DX: Unspecified osteoarthritis, unspecified site: M19.90

## 2023-12-19 LAB — BASIC METABOLIC PANEL WITH GFR
Anion gap: 8 (ref 5–15)
BUN: 9 mg/dL (ref 6–20)
CO2: 26 mmol/L (ref 22–32)
Calcium: 9.7 mg/dL (ref 8.9–10.3)
Chloride: 103 mmol/L (ref 98–111)
Creatinine, Ser: 1.23 mg/dL — ABNORMAL HIGH (ref 0.44–1.00)
GFR, Estimated: 57 mL/min — ABNORMAL LOW (ref >60)
Glucose, Bld: 101 mg/dL — ABNORMAL HIGH (ref 70–99)
Potassium: 4.1 mmol/L (ref 3.5–5.1)
Sodium: 137 mmol/L (ref 135–145)

## 2023-12-19 LAB — CBC
HCT: 45.5 % (ref 36.0–46.0)
Hemoglobin: 14.1 g/dL (ref 12.0–15.0)
MCH: 28.8 pg (ref 26.0–34.0)
MCHC: 31 g/dL (ref 30.0–36.0)
MCV: 92.9 fL (ref 80.0–100.0)
Platelets: 347 K/uL (ref 150–400)
RBC: 4.9 MIL/uL (ref 3.87–5.11)
RDW: 14.5 % (ref 11.5–15.5)
WBC: 9.9 K/uL (ref 4.0–10.5)
nRBC: 0 % (ref 0.0–0.2)

## 2023-12-22 NOTE — Anesthesia Preprocedure Evaluation (Signed)
 Anesthesia Evaluation  Patient identified by MRN, date of birth, ID band Patient awake    Reviewed: Allergy & Precautions, NPO status , Patient's Chart, lab work & pertinent test results  History of Anesthesia Complications (+) history of anesthetic complications  Airway Mallampati: II  TM Distance: >3 FB     Dental no notable dental hx. (+) Teeth Intact, Dental Advisory Given   Pulmonary asthma , pneumonia, resolved, COPD,  COPD inhaler, Current Smoker and Patient abstained from smoking.   Pulmonary exam normal breath sounds clear to auscultation       Cardiovascular hypertension, Pt. on medications Normal cardiovascular exam Rhythm:Regular Rate:Normal     Neuro/Psych  Headaches PSYCHIATRIC DISORDERS Anxiety Depression       GI/Hepatic ,GERD  Medicated,,  Endo/Other  Obesity GLP-1 RA therapy-  last dose 7/16  Renal/GU Renal diseaseHx/o renal calculi   Pelvic pain syndrome negative genitourinary   Musculoskeletal  (+) Arthritis , Osteoarthritis,  Eczema   Abdominal  (+) + obese  Peds  Hematology  (+) Blood dyscrasia, anemia   Anesthesia Other Findings   Reproductive/Obstetrics                              Anesthesia Physical Anesthesia Plan  ASA: 2  Anesthesia Plan: General   Post-op Pain Management: Minimal or no pain anticipated   Induction: Intravenous  PONV Risk Score and Plan: 4 or greater and Treatment may vary due to age or medical condition, Midazolam , Ondansetron , Dexamethasone , Propofol  infusion and TIVA  Airway Management Planned: LMA  Additional Equipment: None  Intra-op Plan:   Post-operative Plan: Extubation in OR  Informed Consent: I have reviewed the patients History and Physical, chart, labs and discussed the procedure including the risks, benefits and alternatives for the proposed anesthesia with the patient or authorized representative who has indicated  his/her understanding and acceptance.     Dental advisory given  Plan Discussed with: CRNA and Anesthesiologist  Anesthesia Plan Comments: (See PAT note 12/19/23)         Anesthesia Quick Evaluation

## 2023-12-22 NOTE — Progress Notes (Signed)
 Anesthesia Chart Review  Case: [id number] Date/Time: 12/23/23 1145   Procedure: CYSTOSCOPY, WITH BLADDER HYDRODISTENSION   Anesthesia type: General   Diagnosis: Pelvic pain syndrome [R10.2]   Pre-op diagnosis: PELVIC PAIN   Location: [facility] PROC[facility] / [facility]   Surgeons: Gaston Hamilton, MD       DISCUSSION:42 y.o. former smoker with h/o HTN, moderate persistent asthma, COPD, pelvic pain scheduled for above procedure 12/23/2023 with Dr. Hamilton Gaston.   Last dose of Ozempic 12/10/23.   Pt follows with pulmonology for moderate asthma and COPD, last seen 10/07/2023. Per notes recently quit smoking. She does experience excessive daytime sleepiness, sleep study has been ordered. No changes made at this visit, 3 month follow up recommended. Pt reports breathing stable at PAT visit, no recent exacerbations. Evaluate DOS.   Pt evaluated by cardiology 07/01/23 for chest pain. Stress test and Echo ordered.   Stress test 07/14/23, mildly reduced functional status, no evidence of ischemia.   Echo 07/14/23 (Care Everywhere) Left Ventricle: Left ventricle size is normal.    Left Ventricle: Systolic function is normal. EF: 60-65%.    Right Ventricle: Right ventricle size is normal.    Right Ventricle: Systolic function is normal.  VS: BP 129/87   Pulse 91   Temp 36.7 C (Oral)   Resp 14   Ht 5' 3 (1.6 m)   Wt 92.5 kg   LMP 12/05/2023 (Approximate)   SpO2 95%   BMI 36.14 kg/m   PROVIDERS: Suanne Pfeiffer, NP is PCP  Cardiologist - Marcello Lennox, MD  Pulmonologist- Verdon Gore, MD   LABS: Labs reviewed: Acceptable for surgery. (all labs ordered are listed, but only abnormal results are displayed)  Labs Reviewed  BASIC METABOLIC PANEL WITH GFR - Abnormal; Notable for the following components:      Result Value   Glucose, Bld 101 (*)    Creatinine, Ser 1.23 (*)    GFR, Estimated 57 (*)    All other components within normal limits  CBC     IMAGES:   EKG:   CV: Echo  05/26/2022 1. Left ventricular ejection fraction, by estimation, is 65 to 70%. The  left ventricle has normal function. Left ventricular endocardial border  not optimally defined to evaluate regional wall motion. Indeterminate  diastolic filling due to E-A fusion.   2. Right ventricular systolic function is normal. The right ventricular  size is normal. Tricuspid regurgitation signal is inadequate for assessing  PA pressure.   3. The mitral valve is grossly normal. No evidence of mitral valve  regurgitation. No evidence of mitral stenosis.   4. The aortic valve was not well visualized. Aortic valve regurgitation  is not visualized. No aortic stenosis is present.   5. The inferior vena cava is normal in size with <50% respiratory  variability, suggesting right atrial pressure of 8 mmHg.  Past Medical History:  Diagnosis Date   Anemia    Anxiety    Arthritis    Asthma    Complication of anesthesia    during foot surgery in 2023 went into repiratory distress   COPD (chronic obstructive pulmonary disease) (HCC)    Depression    doing good, not on meds now   GERD (gastroesophageal reflux disease)    Headache    migraines   History of kidney stones    Hypertension    Kidney stones    Missed abortion 10/02/2013   Ovarian cyst    Pneumonia    Pregnancy induced hypertension  Suicidal intent    Vaginal Pap smear, abnormal    bx, f/u ok    Past Surgical History:  Procedure Laterality Date   CESAREAN SECTION     CESAREAN SECTION N/A 06/06/2015   Procedure: CESAREAN SECTION;  Surgeon: Aida DELENA Na, MD;  Location: [facility];  Service: Obstetrics;  Laterality: N/A;   CHOLECYSTECTOMY N/A 08/23/2016   Procedure: LAPAROSCOPIC CHOLECYSTECTOMY;  Surgeon: Mitzie DELENA Freund, MD;  Location: [facility];  Service: General;  Laterality: N/A;   DILATION AND CURETTAGE OF UTERUS     DILATION AND EVACUATION N/A 10/04/2013   Procedure: DILATATION AND EVACUATION;  Surgeon: Ezzie Buba, MD;   Location: [facility];  Service: Gynecology;  Laterality: N/A;  US  in room    FOOT SURGERY Right    TONSILLECTOMY     TUBAL LIGATION      MEDICATIONS:  ADVAIR  HFA 230-21 MCG/ACT inhaler   albuterol  (PROVENTIL ) (2.5 MG/3ML) 0.083% nebulizer solution   albuterol  (VENTOLIN  HFA) 108 (90 Base) MCG/ACT inhaler   Azelastine -Fluticasone  137-50 MCG/ACT SUSP   budesonide  (PULMICORT ) 0.25 MG/2ML nebulizer solution   busPIRone  (BUSPAR ) 10 MG tablet   clonazePAM  (KLONOPIN ) 0.5 MG tablet   Dupilumab  (DUPIXENT ) 300 MG/2ML SOAJ   folic acid  (FOLVITE ) 1 MG tablet   gabapentin  (NEURONTIN ) 300 MG capsule   hydrOXYzine  (VISTARIL ) 25 MG capsule   levocetirizine (XYZAL ) 5 MG tablet   losartan  (COZAAR ) 50 MG tablet   montelukast  (SINGULAIR ) 10 MG tablet   ondansetron  (ZOFRAN -ODT) 4 MG disintegrating tablet   OXTELLAR XR 300 MG TB24   pantoprazole  (PROTONIX ) 40 MG tablet   QUEtiapine  (SEROQUEL ) 25 MG tablet   REXULTI 1 MG TABS tablet   SYMBICORT  160-4.5 MCG/ACT inhaler   Tiotropium Bromide  Monohydrate (SPIRIVA  RESPIMAT) 1.25 MCG/ACT AERS   tiZANidine (ZANAFLEX) 4 MG tablet   TRINTELLIX 10 MG TABS tablet   WEGOVY 0.5 MG/0.5ML SOAJ   No current facility-administered medications for this encounter.    Harlene Hoots Ward, PA-C [facility] [phone]

## 2023-12-23 ENCOUNTER — Ambulatory Visit (HOSPITAL_BASED_OUTPATIENT_CLINIC_OR_DEPARTMENT_OTHER): Payer: Self-pay

## 2023-12-23 ENCOUNTER — Encounter (HOSPITAL_COMMUNITY)
Admission: RE | Disposition: A | Discharge status: Home / Self Care | Payer: Self-pay | Source: Home / Self Care | Attending: Urology

## 2023-12-23 ENCOUNTER — Encounter (HOSPITAL_COMMUNITY): Payer: Self-pay | Admitting: Urology

## 2023-12-23 ENCOUNTER — Ambulatory Visit (HOSPITAL_COMMUNITY): Payer: Self-pay | Admitting: Physician Assistant

## 2023-12-23 ENCOUNTER — Other Ambulatory Visit: Payer: Self-pay

## 2023-12-23 ENCOUNTER — Ambulatory Visit (HOSPITAL_COMMUNITY)
Admission: RE | Admit: 2023-12-23 | Discharge: 2023-12-23 | Disposition: A | Discharge status: Home / Self Care | Attending: Urology | Admitting: Urology

## 2023-12-23 DIAGNOSIS — F32A Depression, unspecified: Secondary | ICD-10-CM | POA: Insufficient documentation

## 2023-12-23 DIAGNOSIS — Z79899 Other long term (current) drug therapy: Secondary | ICD-10-CM | POA: Insufficient documentation

## 2023-12-23 DIAGNOSIS — K219 Gastro-esophageal reflux disease without esophagitis: Secondary | ICD-10-CM | POA: Insufficient documentation

## 2023-12-23 DIAGNOSIS — N3020 Other chronic cystitis without hematuria: Secondary | ICD-10-CM | POA: Diagnosis not present

## 2023-12-23 DIAGNOSIS — Z87891 Personal history of nicotine dependence: Secondary | ICD-10-CM | POA: Insufficient documentation

## 2023-12-23 DIAGNOSIS — Z7951 Long term (current) use of inhaled steroids: Secondary | ICD-10-CM | POA: Diagnosis not present

## 2023-12-23 DIAGNOSIS — M069 Rheumatoid arthritis, unspecified: Secondary | ICD-10-CM | POA: Diagnosis not present

## 2023-12-23 DIAGNOSIS — R102 Pelvic and perineal pain: Secondary | ICD-10-CM | POA: Insufficient documentation

## 2023-12-23 DIAGNOSIS — Z87442 Personal history of urinary calculi: Secondary | ICD-10-CM | POA: Insufficient documentation

## 2023-12-23 DIAGNOSIS — Z6836 Body mass index (BMI) 36.0-36.9, adult: Secondary | ICD-10-CM | POA: Diagnosis not present

## 2023-12-23 DIAGNOSIS — I10 Essential (primary) hypertension: Secondary | ICD-10-CM | POA: Insufficient documentation

## 2023-12-23 DIAGNOSIS — J449 Chronic obstructive pulmonary disease, unspecified: Secondary | ICD-10-CM | POA: Diagnosis not present

## 2023-12-23 DIAGNOSIS — F419 Anxiety disorder, unspecified: Secondary | ICD-10-CM | POA: Insufficient documentation

## 2023-12-23 DIAGNOSIS — E669 Obesity, unspecified: Secondary | ICD-10-CM | POA: Insufficient documentation

## 2023-12-23 DIAGNOSIS — R519 Headache, unspecified: Secondary | ICD-10-CM | POA: Insufficient documentation

## 2023-12-23 DIAGNOSIS — N3941 Urge incontinence: Secondary | ICD-10-CM | POA: Insufficient documentation

## 2023-12-23 DIAGNOSIS — R350 Frequency of micturition: Secondary | ICD-10-CM | POA: Insufficient documentation

## 2023-12-23 HISTORY — PX: CYSTO WITH HYDRODISTENSION: SHX5453

## 2023-12-23 SURGERY — CYSTOSCOPY, WITH BLADDER HYDRODISTENSION
Anesthesia: General

## 2023-12-23 MED ORDER — CHLORHEXIDINE GLUCONATE 0.12 % MT SOLN
15.0000 mL | Freq: Once | OROMUCOSAL | Status: AC
  Administered 2023-12-23: 15 mL via OROMUCOSAL

## 2023-12-23 MED ORDER — ORAL CARE MOUTH RINSE: 15.0000 mL | Freq: Once | OROMUCOSAL | Status: AC

## 2023-12-23 MED ORDER — FENTANYL CITRATE (PF) 100 MCG/2ML IJ SOLN
INTRAMUSCULAR | Status: DC | PRN
  Administered 2023-12-23 (×2): 50 ug via INTRAVENOUS

## 2023-12-23 MED ORDER — LACTATED RINGERS IV SOLN: INTRAVENOUS | Status: DC

## 2023-12-23 MED ORDER — CIPROFLOXACIN HCL 250 MG PO TABS: 250.0000 mg | ORAL_TABLET | Freq: Two times a day (BID) | ORAL | 0 refills | Status: AC

## 2023-12-23 MED ORDER — PROPOFOL 1000 MG/100ML IV EMUL
INTRAVENOUS | Status: AC
  Filled 2023-12-23: qty 100

## 2023-12-23 MED ORDER — DEXAMETHASONE SODIUM PHOSPHATE 10 MG/ML IJ SOLN
INTRAMUSCULAR | Status: DC | PRN
  Administered 2023-12-23: 5 mg via INTRAVENOUS

## 2023-12-23 MED ORDER — PROPOFOL 10 MG/ML IV BOLUS
INTRAVENOUS | Status: DC | PRN
  Administered 2023-12-23: 150 mg via INTRAVENOUS
  Administered 2023-12-23: 125 ug/kg/min via INTRAVENOUS

## 2023-12-23 MED ORDER — DEXMEDETOMIDINE HCL IN NACL 80 MCG/20ML IV SOLN
INTRAVENOUS | Status: AC
  Filled 2023-12-23: qty 20

## 2023-12-23 MED ORDER — ONDANSETRON HCL 4 MG/2ML IJ SOLN
INTRAMUSCULAR | Status: AC
  Filled 2023-12-23: qty 2

## 2023-12-23 MED ORDER — ONDANSETRON HCL 4 MG/2ML IJ SOLN: 4.0000 mg | Freq: Once | INTRAMUSCULAR | Status: DC | PRN

## 2023-12-23 MED ORDER — CIPROFLOXACIN IN D5W 400 MG/200ML IV SOLN
400.0000 mg | INTRAVENOUS | Status: AC
  Administered 2023-12-23: 400 mg via INTRAVENOUS
  Filled 2023-12-23: qty 200

## 2023-12-23 MED ORDER — TRAMADOL HCL 50 MG PO TABS
50.0000 mg | ORAL_TABLET | Freq: Four times a day (QID) | ORAL | 0 refills | Status: AC | PRN
Start: 2023-12-23 — End: 2024-12-22

## 2023-12-23 MED ORDER — ALBUTEROL SULFATE (2.5 MG/3ML) 0.083% IN NEBU
2.5000 mg | INHALATION_SOLUTION | RESPIRATORY_TRACT | Status: AC
  Administered 2023-12-23: 2.5 mg via RESPIRATORY_TRACT
  Filled 2023-12-23: qty 3

## 2023-12-23 MED ORDER — ONDANSETRON HCL 4 MG/2ML IJ SOLN
INTRAMUSCULAR | Status: DC | PRN
  Administered 2023-12-23: 4 mg via INTRAVENOUS

## 2023-12-23 MED ORDER — FENTANYL CITRATE PF 50 MCG/ML IJ SOSY: 25.0000 ug | PREFILLED_SYRINGE | INTRAMUSCULAR | Status: DC | PRN

## 2023-12-23 MED ORDER — FENTANYL CITRATE (PF) 100 MCG/2ML IJ SOLN
INTRAMUSCULAR | Status: AC
  Filled 2023-12-23: qty 2

## 2023-12-23 MED ORDER — DEXAMETHASONE SODIUM PHOSPHATE 10 MG/ML IJ SOLN
INTRAMUSCULAR | Status: AC
  Filled 2023-12-23: qty 1

## 2023-12-23 MED ORDER — LIDOCAINE HCL (CARDIAC) PF 100 MG/5ML IV SOSY
PREFILLED_SYRINGE | INTRAVENOUS | Status: DC | PRN
  Administered 2023-12-23: 80 mg via INTRAVENOUS

## 2023-12-23 MED ORDER — MIDAZOLAM HCL 5 MG/5ML IJ SOLN
INTRAMUSCULAR | Status: DC | PRN
  Administered 2023-12-23: 2 mg via INTRAVENOUS

## 2023-12-23 MED ORDER — PROPOFOL 10 MG/ML IV BOLUS
INTRAVENOUS | Status: AC
  Filled 2023-12-23: qty 20

## 2023-12-23 MED ORDER — DEXMEDETOMIDINE HCL IN NACL 80 MCG/20ML IV SOLN
INTRAVENOUS | Status: DC | PRN
  Administered 2023-12-23: 12 ug via INTRAVENOUS

## 2023-12-23 MED ORDER — LIDOCAINE HCL (PF) 2 % IJ SOLN
INTRAMUSCULAR | Status: AC
  Filled 2023-12-23: qty 5

## 2023-12-23 MED ORDER — MIDAZOLAM HCL 2 MG/2ML IJ SOLN
INTRAMUSCULAR | Status: AC
  Filled 2023-12-23: qty 2

## 2023-12-23 SURGICAL SUPPLY — 11 items
BAG URO CATCHER STRL LF (MISCELLANEOUS) ×1 IMPLANT
CATH ROBINSON RED A/P 16FR (CATHETERS) IMPLANT
ELECT REM PT RETURN 15FT ADLT (MISCELLANEOUS) IMPLANT
GLOVE SURG LX STRL 7.5 STRW (GLOVE) ×1 IMPLANT
GOWN STRL REUS W/ TWL XL LVL3 (GOWN DISPOSABLE) ×1 IMPLANT
HOLDER FOLEY CATH W/STRAP (MISCELLANEOUS) IMPLANT
KIT TURNOVER KIT A (KITS) ×1 IMPLANT
NDL HYPO 25X1 1.5 SAFETY (NEEDLE) IMPLANT
NEEDLE HYPO 25X1 1.5 SAFETY (NEEDLE) IMPLANT
PACK CYSTO (CUSTOM PROCEDURE TRAY) ×1 IMPLANT
TUBING CONNECTING 10 (TUBING) ×1 IMPLANT

## 2023-12-23 NOTE — Discharge Instructions (Signed)
 I have reviewed discharge instructions in detail with the patient. They will follow-up with me or their physician as scheduled. My nurse will also be calling the patients as per protocol.  Pain medicine and 3 days of antibiotics sent to pharmacy we will call her tomorrow

## 2023-12-23 NOTE — Op Note (Signed)
 Preoperative diagnosis: Pelvic pain and chronic cystitis Postoperative diagnosis: Pelvic pain and chronic cystitis Surgery: Cystoscopy bladder hydrodistention Surgeon: Dr. Glendia Barry [place]  The patient consented to the above procedure.  Extra care was taken with leg positioning.  21 French cystoscope was utilized.  Preoperative antibiotics given.  Bladder mucosa and trigone were normal.  She did have some heavier trabeculation in the lower third of the bladder.  As I filled the bladder she had 2 deep saccules to the right of the midline in my opinion out of the ordinary for a young woman.  No carcinoma.  Trigone normal.  I emptied the bladder and you reexamine her cystoscopically.  No findings in keeping with interstitial cystitis.  No bleeding or glomerulations.  Bladder was emptied.  I did not instill Marcaine   Patient will be followed as per protocol

## 2023-12-23 NOTE — Progress Notes (Signed)
 1246- Pt request to go to bathroom on arrival to PACU. Pt placed on bedpan, pt rates pain 7/10. RN offered pain medication. Pt refused, pt requested to be able to get up asap to walk to bathroom. RN educated pt that policy requires RN to wait 30 min from arrival to PACU or from pain medication administration prior to getting pt up for her safety. Pt expresses understanding but still requesting to get up right now.  1316- Pt moved to phase 2, pt walked to bathroom. Pt rates pain 6/10 but request to be d/c home asap

## 2023-12-23 NOTE — Transfer of Care (Signed)
 Immediate Anesthesia Transfer of Care Note  Patient: [name]  Procedure(s) Performed: CYSTOSCOPY, WITH BLADDER HYDRODISTENSION  Patient Location: PACU  Anesthesia Type:General  Level of Consciousness: awake  Airway & Oxygen Therapy: Patient Spontanous Breathing and Patient connected to nasal cannula oxygen  Post-op Assessment: Report given to RN and Post -op Vital signs reviewed and stable  Post vital signs: Reviewed and stable  Last Vitals:  Vitals Value Taken Time  BP 129/94 12/23/23 12:46  Temp 36.7 C 12/23/23 12:46  Pulse 94 12/23/23 12:51  Resp 17 12/23/23 12:51  SpO2 93 % 12/23/23 12:51  Vitals shown include unfiled device data.  Last Pain:  Vitals:   12/23/23 1026  TempSrc: Oral  PainSc: 8       Patients Stated Pain Goal: 3 (12/23/23 1026)  Complications: No notable events documented.

## 2023-12-23 NOTE — Anesthesia Procedure Notes (Signed)
 Procedure Name: LMA Insertion Date/Time: 12/23/2023 12:20 PM  Performed by: Belvie Valri NOVAK, CRNAPre-anesthesia Checklist: Patient identified, Emergency Drugs available, Suction available and Patient being monitored Patient Re-evaluated:Patient Re-evaluated prior to induction Oxygen Delivery Method: Circle System Utilized Preoxygenation: Pre-oxygenation with 100% oxygen Induction Type: IV induction Ventilation: Mask ventilation without difficulty LMA: LMA inserted LMA Size: 4.0 Number of attempts: 1 Airway Equipment and Method: Bite block Placement Confirmation: positive ETCO2 Tube secured with: Tape Dental Injury: Teeth and Oropharynx as per pre-operative assessment

## 2023-12-23 NOTE — Interval H&P Note (Signed)
 History and Physical Interval Note:  12/23/2023 11:48 AM  [name]  has presented today for surgery, with the diagnosis of PELVIC PAIN.  The various methods of treatment have been discussed with the patient and family. After consideration of risks, benefits and other options for treatment, the patient has consented to  Procedure(s): CYSTOSCOPY, WITH BLADDER HYDRODISTENSION (N/A) as a surgical intervention.  The patient's history has been reviewed, patient examined, no change in status, stable for surgery.  I have reviewed the patient's chart and labs.  Questions were answered to the patient's satisfaction.     Isami Mehra A Halcyon Heck

## 2023-12-23 NOTE — H&P (Signed)
 Patient recently went to the emergency room with left-sided pain suprapubic discomfort and frequency. She had a CT stone protocol which showed a small nonobstructing stone left kidney. She may have been on Macrodantin. Her urine culture at that time was negative   Patient leaks with coughing sneezing bending lifting. She has urge incontinence. Sometimes she has bedwetting. She wears 3-4 pads a day that are damp   She voids every hour due to fullness pressure pain and urgency. She cannot hold it for 2 hours.   Flow was poor. She can hesitate for many minutes before she urinates. She stops and starts. She does not feel empty. Generally she does not strain   She said she was admitted to [facility] long hospital with a kidney infection in April. She has seen a urologist I believe in [place] who increased Vesicare from 5 mg to 10 mg and gave her fluid modifications. I checked again and she was not admitted to the hospital. I checked the urologist notes from [facility] and he did not add much more than dictated.   She said she has chronic daily pain but it is in the right and left side of her abdomen and sometimes in the suprapubic area. It is not relieved by voiding   On pelvic examination she has mild narrowing of the introitus but she is a bit nervous. No prolapse or stress incontinence. Bladder was a little bit tender. Levator muscles bilaterally appear to be a bit tight and tender   Urine reviewed. Urine sent for culture. Chart reviewed. Patient had microscopic hematuria but she was on her cycle. Postvoid residual was 266 mL   Patient has mixed incontinence. She has frequency. She may have interstitial cystitis. She appears to have an elevated residual. Role of urodynamics and cystoscopy discussed. Call if culture positive. Recheck residual during urodynamics. I will keep an eye on her for microscopic hematuria but I do not plan on repeating CT scan with contrast at this stage. She understands  her bladder might be inflamed. I did not discuss hydrodistention. 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   Today  The patient had called in for her frequency and urgency before the urodynamics. Her culture from the last visit was negative. I called in oxybutynin but it turns out she has failed oxybutynin and Vesicare. Then she called in with frequency and pain taking Azo Standard following urodynamics. There was more than 1 call regarding this.   After 2 days the symptoms settle down. She was also called in an antibiotic. She said that she still gets a discomfort or crampy feeling in the left and right lower quadrant. She has discomfort after she voids. Incontinence stable.   During urodynamics had urgency but could not void. She was catheterized for a few milliliters. Her maximum bladder capacity was 407 mL. Her bladder was stable. She did report some pain with bladder filling. Her cough leak point pressure at 300 mL was 46 cmH2O with mild leakage. She generated other high pressures over 80 cm of water multiple times and did not have stress incontinence. During voiding she sat for several minutes and could not void. She then voided 200 mL in a private restroom and residuals 207 mL. She did not generate a detrusor contraction EMG activity increased during attempted voiding phase. She sat for several minutes trying to empty her bladder and expressed that this happens several times daily. Bladder neck descended approximately a centimeter. No Christmas tree shape bladder. She may have  mild bladder ears. Mild trabeculation. The details of the urodynamics are signed and dictated   Patient has mild mixed incontinence and has 1 positive leak point pressure during urodynamics. I am highly suspect that she has interstitial cystitis and she should not have a sling. She voids every hour due to fullness pressure pain and urgency. She has significant flow symptoms and has had 2 elevated residuals. 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 She has chronic abdominal pain noted   Urinalysis negative with no blood. Urine sent for culture   Postvoid residual was 180 mL   On pelvic examination left and right levator muscles a little bit tender. Bladder mildly tender. No prolapse or stress incontinence.   Patient understands she has mild mixed incontinence. She likely has interstitial cystitis. She should not have a sling at this stage. She has incomplete bladder emptying likely from pelvic floor dyssynergia. Having said that she did not generate a detrusor contraction during urodynamics. I discussed the hydrodistention in detail. I will keep an eye residual urine volume. I will not order a CT scan with contrast currently   I gave her Cipro  samples 250 mg twice a day for 3 days   The patient wants Zofran  once a day. I discussed this with her. She says when she takes the Detrol which is dramatically helped her frequency she gets a bit nauseated. I thought this was reasonable at least in the short-term.   11/20/2023: Patient with above-noted history. She is scheduled for cystoscopy with hydrodistention with Dr. Gaston on 8/5. Seen today acutely for painful inability to void. For the past 3 to 4 days she has been having increasing suprapubic pain and discomfort with associated urge to void. Pain can radiate into the left lower quadrant of the abdomen and flank as well. She is describing urgency with difficulty starting her stream. She often has to strain, leave the bathroom and return in an effort to properly empty her bladder. Somewhat of an exception, when she arrived to clinic she was able to properly empty her bladder without any significant difficulty. She is not having dysuria or gross hematuria. She does continue tolterodine daily, she also endorses constipation. She will have a bowel movement every 3 to 4 days. PVR here as well as UA reassuring.     ALLERGIES: Alprazolam Amlodipine  oxyCODONE   HCl Penicillin     MEDICATIONS: Advair  HFA 230-21 MCG/ACT Aerosol  busPIRone  HCl 10 MG Tablet  diazePAM  10 MG Tablet  Gabapentin  300 MG Capsule  Losartan  Potassium 50 MG Tablet  Ondansetron  4 MG Tablet Disintegrating 1 tablet PO Daily PRN  OXcarbazepine 300 MG Tablet  Pantoprazole  Sodium 40 MG Tablet Delayed Release  Rexulti 1 MG Tablet  Tolterodine Tartrate ER 4 MG Capsule Extended Release 24 Hour 1 capsule PO Daily  Ventolin  HFA 108 (90 Base) MCG/ACT Aerosol Solution  Wegovy     GU PSH: Complex cystometrogram, w/ void pressure and urethral pressure profile studies, any technique - 10/21/2023 Complex Uroflow - 10/21/2023 Cystoscopy - 10/29/2023 Emg surf Electrd - 10/21/2023 Inject For cystogram - 10/21/2023 Intrabd voidng Press - 10/21/2023       PSH Notes: Cesarean Section   NON-GU PSH: Cesarean Delivery Only - 2016 Visit Complexity (formerly GPC1X) - 10/29/2023, 09/24/2023     GU PMH: Chronic cystitis (w/o hematuria) - 10/29/2023 Mixed incontinence - 10/29/2023, - 10/21/2023, - 09/24/2023 Microscopic hematuria - 09/24/2023 History of urolithiasis, History of renal calculi - 2016 Overactive bladder, OAB (overactive bladder) -  2016      PMH Notes: History of kidney stones: A CT scan done in 9/15 revealed no evidence of renal calculi.   LUTS: She reported in 3/16 that she had noted increase in nocturia and a feeling of having a slow urinary stream with also a sensation of incomplete emptying. She said the discomfort persists after urination.  Current therapy: Myrbetriq 50 mg     NON-GU PMH: Anxiety, Anxiety - 2016 Personal history of other diseases of the circulatory system, History of hypertension - 2016 Personal history of other diseases of the digestive system, History of esophageal reflux - 2016 Personal history of other diseases of the respiratory system, History of asthma - 2016 Personal history of other mental and behavioral disorders, History of depression - 2016    FAMILY  HISTORY: Asthma - Runs In Family Hypertension - Runs In Family Thyroid  Disease - Runs In Family   SOCIAL HISTORY: Marital Status: Single Current Smoking Status: Patient does not smoke anymore.   Tobacco Use Assessment Completed: Used Tobacco in last 30 days? Drinks 1 drink per week.  Drinks 3 caffeinated drinks per day.     Notes: Alcohol use, Former smoker, Caffeine use   REVIEW OF SYSTEMS:    GU Review Female:   Patient reports have to strain to urinate, stream starts and stops, get up at night to urinate, hard to postpone urination, and trouble starting your stream. Patient denies being pregnant, leakage of urine, burning /pain with urination, and frequent urination.  Gastrointestinal (Upper):   Patient denies nausea, vomiting, and indigestion/ heartburn.  Gastrointestinal (Lower):   Patient reports constipation. Patient denies diarrhea.  Constitutional:   Patient denies fever, night sweats, weight loss, and fatigue.  Skin:   Patient denies skin rash/ lesion and itching.  Eyes:   Patient denies blurred vision and double vision.  Ears/ Nose/ Throat:   Patient denies sore throat and sinus problems.  Hematologic/Lymphatic:   Patient denies swollen glands and easy bruising.  Cardiovascular:   Patient denies leg swelling and chest pains.  Respiratory:   Patient denies cough and shortness of breath.  Endocrine:   Patient denies excessive thirst.  Musculoskeletal:   Patient denies back pain and joint pain.  Neurological:   Patient denies headaches and dizziness.  Psychologic:   Patient denies depression and anxiety.   VITAL SIGNS: None   MULTI-SYSTEM PHYSICAL EXAMINATION:    Constitutional: Well-nourished. No physical deformities. Normally developed. Good grooming.  Neck: Neck symmetrical, not swollen. Normal tracheal position.  Respiratory: No labored breathing, no use of accessory muscles.   Skin: No paleness, no jaundice, no cyanosis. No lesion, no ulcer, no rash.  Neurologic /  Psychiatric: Oriented to time, oriented to place, oriented to person. No depression, no anxiety, no agitation.  Musculoskeletal: Normal gait and station of head and neck.     Complexity of Data:  Source Of History:  Patient, Medical Record Summary  Records Review:   Previous Doctor Records, Previous Hospital Records, Previous Patient Records  Urine Test Review:   Urinalysis, Urine Culture  Urodynamics Review:   Review Bladder Scan, Review Urodynamics Tests   PROCEDURES:         PVR Ultrasound - 48201  Scanned Volume: 3 cc         Visit Complexity - G2211          Urinalysis w/Scope - 81001 Dipstick Dipstick Cont'd Micro  Color: Yellow Bilirubin: Neg mg/dL WBC/hpf: NS (Not Seen)  Appearance: Clear Ketones: Neg mg/dL  RBC/hpf: NS (Not Seen)  Specific Gravity: 1.025 Blood: Neg ery/uL Bacteria: NS (Not Seen)  pH: 6.0 Protein: 1+ mg/dL Cystals: NS (Not Seen)  Glucose: Neg mg/dL Urobilinogen: 1.0 mg/dL Casts: NS (Not Seen)    Nitrites: Neg Trichomonas: Not Present    Leukocyte Esterase: Neg leu/uL Mucous: Not Present      Epithelial Cells: 0 - 5/hpf      Yeast: NS (Not Seen)      Sperm: Not Present    Notes: micro performed on unspun urine due to QNS    ASSESSMENT:      ICD-10 Details  1 GU:   Urinary Hesitancy - R39.11 Chronic, Exacerbation  2   Straining on Urination - R39.16 Chronic, Exacerbation  3   Urinary Urgency - R39.15 Chronic, Stable   PLAN:           Schedule Return Visit/Planned Activity: Keep Scheduled Appointment - Follow up MD, Schedule Surgery          Document Letter(s):  Created for Patient: Clinical Summary         Notes:   Reassuring discussion and evaluation today. Pending her ongoing evaluation and planned hydrodistention in the near future, current obstructive uropathy symptoms may be exacerbated by her endorsed underlying constipation and excessive antimuscarinic effect from her continued use of tolterodine. I did briefly consider tamsulosin but  for now I want her to stop the tolterodine and let that wash out of her system for couple days to see if this helps settle things and improve her ability to properly empty her bladder. We also discussed ongoing bowel management strategies. I want her to try daily MiraLAX to see if that helps make her more regular. I did send a repeat urine culture and if indicated upon review appropriate antimicrobial treatment will be prescribed. She will keep her scheduled hydrodistention procedure with Dr. Gaston on 8/5.

## 2023-12-24 ENCOUNTER — Encounter (HOSPITAL_COMMUNITY): Payer: Self-pay | Admitting: Urology

## 2023-12-25 ENCOUNTER — Encounter: Payer: Self-pay | Admitting: Internal Medicine

## 2023-12-25 ENCOUNTER — Telehealth: Payer: Self-pay

## 2023-12-25 ENCOUNTER — Encounter (INDEPENDENT_AMBULATORY_CARE_PROVIDER_SITE_OTHER): Payer: Self-pay

## 2023-12-25 ENCOUNTER — Other Ambulatory Visit: Payer: Self-pay

## 2023-12-25 NOTE — Progress Notes (Signed)
  [facility]  RETURN PATIENT EVALUATION   Primary Care Physician:  Charmaine Heller, NP   Patient ID:  [name] is a 43 y.o. (DOB Oct 06, 1980) female.    Subjective   Patient ID:  [name] is a 43 y.o. (DOB 04/29/1981) female.     Patient presents with  . Follow-up    Sleep study     [name] is here for a follow up on sleep study results.  Patient completed home sleep study on 11/13/23 which shows mild obstructive sleep apnea based on a respiratory disturbance index of 9.4 and oxygen nadir of 73%. Moderate snoring was observed. Patient returns to the clinic to follow up on these findings and discuss treatment options.   She denies any issues with driving drowsy but does understand the dangers and knows to pull over.   Past Medical History, Past Surgery History, Allergies, Social History, and Family History were reviewed and updated.    Review of Systems is complete and negative except as noted.  Objective   BP (!) 124/94   Pulse 106   Temp 97.8 F (36.6 C) (Skin)   Resp 16   Ht 5' 3.75 (1.619 m)   Wt 213 lb (96.6 kg)   SpO2 93%   BMI 36.85 kg/m  General:  Well Developed, Well Nourished, No distress, Well-groomed and pleasant. HEENT:     Mallampati 4. She has an elevated hard palate without some scalloping noted bilateral to the tongue Neck:  Neck circumference is 15.5 inches CV:   RRR without Murmur  Lungs:   clear to auscultation with normal effort Neuro:  alert, oriented, and ambulating without difficulty.  Memory intact.   Assessment   43 y.o. female with PMHx as above who presents for follow up of sleep study and insomnia.  I have discussed results of sleep study with patient which shows mild obstructive sleep apnea.  I have discussed the interaction between sleep apnea and insomnia.  I recommended treatment with PAP therapy.  Patient is in agreement with this, order placed. It  is recommended that patient wear cpap >4 hours at least 70% of the time for compliance.  Patient does have home oxygen that she uses as needed.  May consider bleed in oxygen if needed.  Patient will continue her Belsomra as prescribed by PCP.  Plan     Obstructive Sleep Apnea AHI 9.4 Start PAP therapy  Follow up for compliance download  Insomnia Continue Belsomra as prescribed   Risks, benefits, and alternatives of the medications and treatment plan prescribed today were discussed, and patient expressed understanding. Plan follow-up as discussed or as needed if any worsening symptoms or change in condition.      Electronically Signed: Golda Sis, FNP-C [facility]  12/25/2023 12:06 PM

## 2023-12-25 NOTE — Telephone Encounter (Signed)
 Copied from CRM [id number]. Topic: Clinical - Medication Question >> Dec 24, 2023  3:12 PM [name] wrote: Reason for CRM: Pt is requesting to speak with Dr. Correne nurse regarding the medication pantoprazole  (PROTONIX ) 40 MG tablet. Pt stated the change to 40 mg isn't helping as the pt is experiencing indigestion all day. Please call the pt back at [phone] ok to leave a vm.

## 2023-12-25 NOTE — Anesthesia Postprocedure Evaluation (Signed)
 Anesthesia Post Note  Patient: [name]  Procedure(s) Performed: CYSTOSCOPY, WITH BLADDER HYDRODISTENSION     Patient location during evaluation: PACU Anesthesia Type: General Level of consciousness: sedated and patient cooperative Pain management: pain level controlled Vital Signs Assessment: post-procedure vital signs reviewed and stable Respiratory status: spontaneous breathing Cardiovascular status: stable Anesthetic complications: no   No notable events documented.  Last Vitals:  Vitals:   12/23/23 1315 12/23/23 1324  BP: 117/85 (!) 125/95  Pulse: 67 71  Resp: 14 20  Temp:  36.7 C  SpO2: 93% 94%    Last Pain:  Vitals:   12/23/23 1324  TempSrc: Temporal  PainSc: 0-No pain                 Norleen Pope

## 2023-12-25 NOTE — Progress Notes (Signed)
 Specialty Pharmacy Refill Coordination Note  [name] is a 43 y.o. female contacted today regarding refills of specialty medication(s) Dupilumab  (Dupixent )   Patient requested Delivery   Delivery date: 12/26/23   Verified address: [address] [place] [address]   Medication will be filled on 12/25/23.

## 2023-12-26 NOTE — Telephone Encounter (Signed)
 Can increase to twice daily for a few weeks to see if improvement.

## 2023-12-31 MED ORDER — PANTOPRAZOLE SODIUM 40 MG PO TBEC: 40.0000 mg | DELAYED_RELEASE_TABLET | Freq: Every day | ORAL | 1 refills | Status: DC

## 2023-12-31 NOTE — Telephone Encounter (Signed)
 I called and spoke with the pt and notified of response from Dr. Meade. She verbalized understanding. I have sent new rx to the preferred pharm. Nothing further needed.

## 2024-01-01 ENCOUNTER — Other Ambulatory Visit: Payer: Self-pay | Admitting: Internal Medicine

## 2024-01-06 ENCOUNTER — Telehealth: Admitting: Physician Assistant

## 2024-01-06 DIAGNOSIS — J441 Chronic obstructive pulmonary disease with (acute) exacerbation: Secondary | ICD-10-CM | POA: Diagnosis not present

## 2024-01-07 ENCOUNTER — Other Ambulatory Visit: Payer: Self-pay

## 2024-01-07 ENCOUNTER — Encounter (HOSPITAL_COMMUNITY): Payer: Self-pay

## 2024-01-07 ENCOUNTER — Emergency Department (HOSPITAL_COMMUNITY)

## 2024-01-07 ENCOUNTER — Emergency Department (HOSPITAL_COMMUNITY)
Admission: EM | Admit: 2024-01-07 | Discharge: 2024-01-08 | Disposition: A | Discharge status: Home / Self Care | Attending: Emergency Medicine | Admitting: Emergency Medicine

## 2024-01-07 DIAGNOSIS — R1084 Generalized abdominal pain: Secondary | ICD-10-CM | POA: Insufficient documentation

## 2024-01-07 DIAGNOSIS — D72829 Elevated white blood cell count, unspecified: Secondary | ICD-10-CM | POA: Diagnosis not present

## 2024-01-07 DIAGNOSIS — R1032 Left lower quadrant pain: Secondary | ICD-10-CM | POA: Diagnosis present

## 2024-01-07 LAB — HCG, SERUM, QUALITATIVE: Preg, Serum: NEGATIVE

## 2024-01-07 LAB — CBC
HCT: 46 % (ref 36.0–46.0)
Hemoglobin: 14.7 g/dL (ref 12.0–15.0)
MCH: 29.3 pg (ref 26.0–34.0)
MCHC: 32 g/dL (ref 30.0–36.0)
MCV: 91.6 fL (ref 80.0–100.0)
Platelets: 344 K/uL (ref 150–400)
RBC: 5.02 MIL/uL (ref 3.87–5.11)
RDW: 14.2 % (ref 11.5–15.5)
WBC: 13.1 K/uL — ABNORMAL HIGH (ref 4.0–10.5)
nRBC: 0 % (ref 0.0–0.2)

## 2024-01-07 LAB — BASIC METABOLIC PANEL WITH GFR
Anion gap: 12 (ref 5–15)
BUN: 9 mg/dL (ref 6–20)
CO2: 23 mmol/L (ref 22–32)
Calcium: 9.5 mg/dL (ref 8.9–10.3)
Chloride: 103 mmol/L (ref 98–111)
Creatinine, Ser: 0.84 mg/dL (ref 0.44–1.00)
GFR, Estimated: >60 mL/min (ref >60)
Glucose, Bld: 100 mg/dL — ABNORMAL HIGH (ref 70–99)
Potassium: 3.7 mmol/L (ref 3.5–5.1)
Sodium: 138 mmol/L (ref 135–145)

## 2024-01-07 LAB — LIPASE, BLOOD: Lipase: 31 U/L (ref 11–51)

## 2024-01-07 LAB — TROPONIN I (HIGH SENSITIVITY): Troponin I (High Sensitivity): 3 ng/L (ref <18)

## 2024-01-07 MED ORDER — PREDNISONE 20 MG PO TABS
40.0000 mg | ORAL_TABLET | Freq: Every day | ORAL | 0 refills | Status: DC
Start: 2024-01-07 — End: 2024-01-20

## 2024-01-07 NOTE — Progress Notes (Signed)
 Subjective   Patient ID:  [name] is a 43 y.o. (DOB November 25, 1980) female. Karn has been experiencing heavy vaginal bleeding with her periods and they are coming twice monthly about two weeks apart. She had her BTL in 2017. She has daily pelvic pain which is predominantly on her left side. The pain is not relieved with any OTC analgesics or her gabapentin . She had a normal pap smear in 2022.      Patient presents with  . office visit    New pt referral by Charmaine Heller, NP at [facility] family for menorrhagia with irregular cycle,  pt reports cycles have gotten worse over the last 6 months, pain that comes and goes, reports pain in more of the left side, her cycles usually last 10 days,   she did have bladder surgery on 8/5,  s/p BTL    Past Medical History, Past Surgery History, Allergies, Social History, and Family History were reviewed and updated.    Review of Systems is complete and negative except as noted.  Objective   BP 122/86 (BP Location: Right Upper Arm, Patient Position: Sitting)   Ht 5' 3.75 (1.619 m)   Wt 203 lb (92.1 kg)   LMP 12/21/2023 (Approximate)   BMI 35.12 kg/m   Chaperone: Caroll  General:  Well Developed, Well Nourished, No distress Skin:  No Focal Rashes  Pelvic Exam:  Nl external genitalia, no lesions, Vagina nl without discharge, Urethra nl Nl cervix, non friable, No CMT, bimanual exam is unrewarding, slightly tender midline      Impression   1. Menorrhagia with irregular cycle   2. History of bilateral tubal ligation   3. Pelvic pain in female     Plan  We will have her return for pelvic ultrasound and will do with MD/DO as she may want to discuss surgical options for management. We reviewed endometrial ablation as a possibility as she has had a BTL. Hysterectomy also briefly reviewed but she will discuss further once ultrasound results are available. All questions answered.     Patient's Medications       *  Accurate as of January 07, 2024  3:00 PM. Reflects encounter med changes as of last refresh          Continued Medications      Instructions  * albuterol  sulfate HFA 108 (90 Base) MCG/ACT inhaler Commonly known as: PROVENTIL ,VENTOLIN ,PROAIR   2 puffs, Inhalation, Every 6 hours as needed   * albuterol  0.63 mg/3 mL nebulizer solution Commonly known as: ACCUNEB   0.63 mg, Nebulization, Every 6 hours as needed   busPIRone  10 mg tablet Commonly known as: BUSPAR   10 mg, Oral, 3 times daily   fluticasone -salmeterol 230-21 MCG/ACT inhaler Commonly known as: ADVAIR  HFA  2 puffs, Inhalation, 2 times a day   folic acid  1 mg tablet  1 mg, Oral, Daily   gabapentin  300 mg capsule Commonly known as: NEURONTIN   300 mg, Oral, 3 times a day   hydrOXYzine  pamoate 25 mg capsule Commonly known as: VISTARIL   25 mg, Oral, 3 times a day as needed   losartan  potassium 50 mg tablet Commonly known as: COZAAR   50 mg, Oral, 2 times a day   Misc. Devices Misc  Nebulizer machine with supplies  Dx Asthma   montelukast  10 MG tablet Commonly known as: SINGULAIR   10 mg, Oral, Daily   ondansetron  4 mg disintegrating tablet Commonly known as: ZOFRAN -ODT  4 mg, Oral, Every 8 hours as needed  ondansetron  4 mg tablet Commonly known as: ZOFRAN   4 mg, Oral, Every 8 hours as needed   OXcarbazepine 300 mg tablet Commonly known as: TRILEPTAL  300 mg, 2 times a day   pantoprazole  sodium 40 mg tablet Commonly known as: PROTONIX   40 mg, Oral, Daily   semaglutide-weight management 0.5 mg/0.5 mL Soaj injection Commonly known as: WEGOVY  0.5 mg, Subcutaneous, Weekly   SPIRIVA  RESPIMAT 2.5 MCG/ACT inhaler Generic drug: tiotropium bromide   1 puff, 2 times a day   SYMBICORT  160-4.5 MCG/ACT inhaler Generic drug: budesonide -formoterol   2 puffs, Inhalation, 2 times a day, SMARTSIG:Via Inhaler   tiZANidine 4 mg tablet Commonly known as: ZANAFLEX  4 mg, Oral, Every 8 hours as needed      * * This  list has 2 medication(s) that are the same as other medications prescribed for you. Read the directions carefully, and ask your doctor or other care provider to review them with you.          Discontinued Medications    celecoxib  200 mg capsule Commonly known as: CELEBREX  Stopped by: Wyline Eagles   dicyclomine  10 mg capsule Commonly known as: BENTYL  Stopped by: Wyline Eagles   doxepin HCl 10 mg capsule Commonly known as: SINEQUAN Stopped by: Cristie Gibson   naltrexone  50 mg tablet Commonly known as: REVIA  Stopped by: Wyline Eagles        No orders of the defined types were placed in this encounter.   Risks, benefits, and alternatives of the medications and treatment plan prescribed today were discussed, and patient expressed understanding. Plan follow-up as discussed or as needed if any worsening symptoms or change in condition.    A yearly preventative health exam was recommended and current age based recommendations were discussed.  *Some images could not be shown.

## 2024-01-07 NOTE — Progress Notes (Signed)
 E-Visit for Asthma  Based on what you have shared with me, it looks like you may have a flare up of your asthma.  Asthma is a chronic (ongoing) lung disease which results in airway obstruction, inflammation and hyper-responsiveness.   Asthma symptoms vary from person to person, with common symptoms including nighttime awakening and decreased ability to participate in normal activities as a result of shortness of breath. It is often triggered by changes in weather, changes in the season, changes in air temperature, or inside (home, school, daycare or work) allergens such as animal dander, mold, mildew, woodstoves or cockroaches.   It can also be triggered by hormonal changes, extreme emotion, physical exertion or an upper respiratory tract illness.     It is important to identify the trigger, and then eliminate or avoid the trigger if possible.   If you have been prescribed medications to be taken on a regular basis, it is important to follow the asthma action plan and to follow guidelines to adjust medication in response to increasing symptoms of decreased peak expiratory flow rate  Treatment: I have prescribed: Prednisone 40mg  by mouth per day for 5 - 7 days  HOME CARE Only take medications as instructed by your medical team. Consider wearing a mask or scarf to improve breathing air temperature have been shown to decrease irritation and decrease exacerbations Get rest. Taking a steamy shower or using a humidifier may help nasal congestion sand ease sore throat pain. You can place a towel over your head and breathe in the steam from hot water coming from a faucet. Using a saline nasal spray works much the same way.  Cough drops, hare candies and sore throat lozenges may ease your cough.  Avoid close contacts especially the very you and the elderly Cover your mouth if you cough or  sneeze Always remember to wash your hands.    GET HELP RIGHT AWAY IF: You develop worsening symptoms; breathlessness at rest, drowsy, confused or agitated, unable to speak in full sentences You have coughing fits You develop a severe headache or visual changes You develop shortness of breath, difficulty breathing or start having chest pain Your symptoms persist after you have completed your treatment plan If your symptoms do not improve within 10 days  MAKE SURE YOU Understand these instructions. Will watch your condition. Will get help right away if you are not doing well or get worse.   Your e-visit answers were reviewed by a board certified advanced clinical practitioner to complete your personal care plan, Depending upon the condition, your plan could have included both over the counter or prescription medications.   Please review your pharmacy choice. Your safety is important to [place]. If you have drug allergies check your prescription carefully.  You can use MyChart to ask questions about today's visit, request a non-urgent  call back, or ask for a work or school excuse for 24 hours related to this e-Visit. If it has been greater than 24 hours you will need to follow up with your provider, or enter a new e-Visit to address those concerns.   You will get an e-mail in the next two days asking about your experience. I hope that your e-visit has been valuable and will speed your recovery. Thank you for using e-visits.  I have spent 5 minutes in review of e-visit questionnaire, review and updating patient chart, medical decision making and response to patient.   Margaretann Loveless, PA-C

## 2024-01-07 NOTE — ED Triage Notes (Signed)
 Left upper abdominal pain that radiates into back for 3 days. Chest pain that started 15-20 minutes PTA. Nausea, no vomiting, intermittent sob.

## 2024-01-08 ENCOUNTER — Encounter (HOSPITAL_COMMUNITY): Payer: Self-pay

## 2024-01-08 ENCOUNTER — Emergency Department (HOSPITAL_COMMUNITY)

## 2024-01-08 LAB — URINALYSIS, ROUTINE W REFLEX MICROSCOPIC
Bilirubin Urine: NEGATIVE
Glucose, UA: NEGATIVE mg/dL
Hgb urine dipstick: NEGATIVE
Ketones, ur: NEGATIVE mg/dL
Leukocytes,Ua: NEGATIVE
Nitrite: NEGATIVE
Protein, ur: 30 mg/dL — AB
Specific Gravity, Urine: 1.021 (ref 1.005–1.030)
pH: 5 (ref 5.0–8.0)

## 2024-01-08 LAB — TROPONIN I (HIGH SENSITIVITY): Troponin I (High Sensitivity): 2 ng/L (ref <18)

## 2024-01-08 MED ORDER — IOHEXOL 300 MG/ML  SOLN
100.0000 mL | Freq: Once | INTRAMUSCULAR | Status: AC | PRN
  Administered 2024-01-08: 100 mL via INTRAVENOUS

## 2024-01-08 MED ORDER — SODIUM CHLORIDE 0.9 % IV SOLN
12.5000 mg | Freq: Four times a day (QID) | INTRAVENOUS | Status: DC | PRN
  Administered 2024-01-08: 12.5 mg via INTRAVENOUS
  Filled 2024-01-08: qty 12.5

## 2024-01-08 MED ORDER — ONDANSETRON HCL 4 MG/2ML IJ SOLN
4.0000 mg | Freq: Once | INTRAMUSCULAR | Status: AC
Start: 2024-01-08 — End: 2024-01-08
  Administered 2024-01-08: 4 mg via INTRAVENOUS
  Filled 2024-01-08: qty 2

## 2024-01-08 MED ORDER — LACTATED RINGERS IV BOLUS
1000.0000 mL | Freq: Once | INTRAVENOUS | Status: AC
  Administered 2024-01-08: 1000 mL via INTRAVENOUS

## 2024-01-08 MED ORDER — HYDROXYZINE HCL 25 MG PO TABS
25.0000 mg | ORAL_TABLET | Freq: Once | ORAL | Status: AC
  Administered 2024-01-08: 25 mg via ORAL
  Filled 2024-01-08: qty 1

## 2024-01-08 MED ORDER — HYDROMORPHONE HCL 1 MG/ML IJ SOLN
0.5000 mg | Freq: Once | INTRAMUSCULAR | Status: AC
  Administered 2024-01-08: 0.5 mg via INTRAVENOUS
  Filled 2024-01-08: qty 1

## 2024-01-08 NOTE — ED Provider Notes (Signed)
 [facility]EMERGENCY DEPT [facility] Emergency Department Provider Note MRN:  [id number]  Arrival date & time: 01/08/24     Chief Complaint   Abdominal Pain   History of Present Illness   [name] is a 43 y.o. year-old female presents to the ED with chief complaint of left lower abdominal pain.  States that she also has some right sided abdominal pain.  State that she has had the pain in the left abdomen for months, but when it worsened today and the pain moved to the right, that was new and she came to the ER for evaluation.  She states that she also had some chest pain that she describes as burning.  Tried ibuprofen , Tylenol , gabapentin  without relief.  States that she saw her OBGYN today and had a pelvic exam.  Has an US  ordered for 9/24.  Reports having had recents cystoscopy on 8/5.  Denies any urinary complaints.  Hx of cholecystectomy.  History provided by patient.   Review of Systems  Pertinent positive and negative review of systems noted in HPI.    Physical Exam   Vitals:   01/08/24 0420 01/08/24 0438  BP: 123/81   Pulse: 78   Resp: 16   Temp:  98.5 F (36.9 C)  SpO2: 96%     CONSTITUTIONAL:  non toxic-appearing, NAD NEURO:  Alert and oriented x 3, CN 3-12 grossly intact EYES:  eyes equal and reactive ENT/NECK:  Supple, no stridor  CARDIO:  normal rate, regular rhythm, appears well-perfused  PULM:  No respiratory distress, CTAB GI/GU:  non-distended,  MSK/SPINE:  No gross deformities, no edema, moves all extremities  SKIN:  no rash, atraumatic   *Additional and/or pertinent findings included in MDM below  Diagnostic and Interventional Summary    EKG Interpretation Date/Time:    Ventricular Rate:    PR Interval:    QRS Duration:    QT Interval:    QTC Calculation:   R Axis:      Text Interpretation:         Labs Reviewed  BASIC METABOLIC PANEL WITH GFR - Abnormal; Notable for the following components:      Result Value   Glucose, Bld  100 (*)    All other components within normal limits  CBC - Abnormal; Notable for the following components:   WBC 13.1 (*)    All other components within normal limits  URINALYSIS, ROUTINE W REFLEX MICROSCOPIC - Abnormal; Notable for the following components:   APPearance HAZY (*)    Protein, ur 30 (*)    Bacteria, UA FEW (*)    All other components within normal limits  HCG, SERUM, QUALITATIVE  LIPASE, BLOOD  TROPONIN I (HIGH SENSITIVITY)  TROPONIN I (HIGH SENSITIVITY)    CT ABDOMEN PELVIS W CONTRAST  Final Result    DG Chest 2 View  Final Result      Medications  promethazine  (PHENERGAN ) 12.5 mg in sodium chloride  0.9 % 50 mL IVPB (12.5 mg Intravenous New Bag/Given 01/08/24 0421)  HYDROmorphone  (DILAUDID ) injection 0.5 mg (0.5 mg Intravenous Given 01/08/24 0205)  ondansetron  (ZOFRAN ) injection 4 mg (4 mg Intravenous Given 01/08/24 0204)  lactated ringers  bolus 1,000 mL (1,000 mLs Intravenous New Bag/Given 01/08/24 0208)  iohexol  (OMNIPAQUE ) 300 MG/ML solution 100 mL (100 mLs Intravenous Contrast Given 01/08/24 0359)  hydrOXYzine  (ATARAX ) tablet 25 mg (25 mg Oral Given 01/08/24 0513)     Procedures  /  Critical Care Procedures  ED Course and Medical Decision Making  I have reviewed the triage vital signs, the nursing notes, and pertinent available records from the EMR.  Social Determinants Affecting Complexity of Care: Patient has no clinically significant social determinants affecting this chief complaint..   ED Course:    Medical Decision Making Patient here with left-sided abdominal pain it has been going on for the past several months, but worsened over the past few days.  She states that it radiates into her right abdomen as well.  She has had some nausea, but denies any vomiting.  She states that she had some pain that radiated up into her chest earlier, but this is resolved.  Vitals are stable, patient is nontoxic in appearance.  Labs are notable for a mild  leukocytosis, no significant electrolyte abnormality, urinalysis is pending.  UA negative for infection.  CT abdomen/pelvis is unremarkable for acute process.  Uncertain cause of the patient's acute on chronic abdominal pain.  She has follow-up with her PCP.  We discussed return precautions.  Amount and/or Complexity of Data Reviewed Labs: ordered. Radiology: ordered.  Risk Prescription drug management.         Consultants: No consultations were needed in caring for this patient.   Treatment and Plan: I considered admission due to patient's initial presentation, but after considering the examination and diagnostic results, patient will not require admission and can be discharged with outpatient follow-up.    Final Clinical Impressions(s) / ED Diagnoses     ICD-10-CM   1. Generalized abdominal pain  R10.84       ED Discharge Orders     None         Discharge Instructions Discussed with and Provided to Patient:     Discharge Instructions      No certain cause of your abdominal pain was found tonight.  I recommend that you follow-up with your regular doctor.  If symptoms change or worsen, please return to the emergency department.       Vicky Charleston, PA-C 01/08/24 0554    Theadore Ozell HERO, MD 01/08/24 [phone]

## 2024-01-08 NOTE — Discharge Instructions (Signed)
 No certain cause of your abdominal pain was found tonight.  I recommend that you follow-up with your regular doctor.  If symptoms change or worsen, please return to the emergency department.

## 2024-01-14 ENCOUNTER — Encounter: Payer: Self-pay | Admitting: Internal Medicine

## 2024-01-14 NOTE — Telephone Encounter (Signed)
**Note De-identified  [place] Obfuscation** Please advise 

## 2024-01-15 ENCOUNTER — Encounter: Payer: Self-pay | Admitting: Internal Medicine

## 2024-01-15 ENCOUNTER — Other Ambulatory Visit: Payer: Self-pay

## 2024-01-15 ENCOUNTER — Encounter (INDEPENDENT_AMBULATORY_CARE_PROVIDER_SITE_OTHER): Payer: Self-pay

## 2024-01-15 ENCOUNTER — Ambulatory Visit: Payer: Self-pay

## 2024-01-15 NOTE — Telephone Encounter (Signed)
 Appointment has been made for September the 2nd. Nothing further needed.

## 2024-01-15 NOTE — Progress Notes (Signed)
 Specialty Pharmacy Refill Coordination Note  [name] is a 43 y.o. female contacted today regarding refills of specialty medication(s) Dupilumab  (Dupixent )   Patient requested (Patient-Rptd) Delivery   Delivery date: 01/28/24   Verified address: (Patient-Rptd) [address]   Medication will be filled on 01/27/24.

## 2024-01-15 NOTE — Telephone Encounter (Signed)
 Will an appointment be made or no? There is no documentation about [name] speaking to pt about an appt.

## 2024-01-15 NOTE — Telephone Encounter (Signed)
 FYI Only or Action Required?: Action required by provider: request for appointment.  Patient was last seen in primary care on .  Called Nurse Triage reporting Wheezing.  Symptoms began several weeks ago.  Interventions attempted: Prescription medications: Prednisone .  Symptoms are: gradually worsening. Continues to have wheezing on Prednisone . O2 sat 95%  Warm Hand Off Completed.        Triage Disposition: See HCP Within 4 Hours (Or PCP Triage)  Patient/caregiver understands and will follow disposition?:    Copied from CRM #[id number]. Topic: Clinical - Red Word Triage >> Jan 15, 2024  1:51 PM Russell PARAS wrote: Red Word that prompted transfer to Nurse Triage:   Wheezing for 4-5 days Taking prednisone  currently, which is not helping Using inhalers and not helping Using oxygen as well. Current oxygen is 95 Head feels weird Rib pain Difficulty getting deep breath.  No chest tightness  Pt of Dr. Meade Answer Assessment - Initial Assessment Questions 1. RESPIRATORY STATUS: Describe your breathing? (e.g., wheezing, shortness of breath, unable to speak, severe coughing)      wheezing 2. ONSET: When did this breathing problem begin?      5 days ago 3. PATTERN Does the difficult breathing come and go, or has it been constant since it started?      Comes and goes 4. SEVERITY: How bad is your breathing? (e.g., mild, moderate, severe)      no 5. RECURRENT SYMPTOM: Have you had difficulty breathing before? If Yes, ask: When was the last time? and What happened that time?      yes 6. CARDIAC HISTORY: Do you have any history of heart disease? (e.g., heart attack, angina, bypass surgery, angioplasty)      NO 7. LUNG HISTORY: Do you have any history of lung disease?  (e.g., pulmonary embolus, asthma, emphysema)     Asthma, COPD 8. CAUSE: What do you think is causing the breathing problem?      Unsure 9. OTHER SYMPTOMS: Do you have any other symptoms? (e.g.,  chest pain, cough, dizziness, fever, runny nose)     no 10. O2 SATURATION MONITOR:  Do you use an oxygen saturation monitor (pulse oximeter) at home? If Yes, ask: What is your reading (oxygen level) today? What is your usual oxygen saturation reading? (e.g., 95%)       95% 11. PREGNANCY: Is there any chance you are pregnant? When was your last menstrual period?       no 12. TRAVEL: Have you traveled out of the country in the last month? (e.g., travel history, exposures)       no  Protocols used: Breathing Difficulty-A-AH  Reason for Disposition  [1] Longstanding difficulty breathing AND [2] not responding to usual therapy  Answer Assessment - Initial Assessment Questions 1. RESPIRATORY STATUS: Describe your breathing? (e.g., wheezing, shortness of breath, unable to speak, severe coughing)      wheezing 2. ONSET: When did this breathing problem begin?      5 days ago 3. PATTERN Does the difficult breathing come and go, or has it been constant since it started?      Comes and goes 4. SEVERITY: How bad is your breathing? (e.g., mild, moderate, severe)      no 5. RECURRENT SYMPTOM: Have you had difficulty breathing before? If Yes, ask: When was the last time? and What happened that time?      yes 6. CARDIAC HISTORY: Do you have any history of heart disease? (e.g., heart attack, angina, bypass surgery,  angioplasty)      NO 7. LUNG HISTORY: Do you have any history of lung disease?  (e.g., pulmonary embolus, asthma, emphysema)     Asthma, COPD 8. CAUSE: What do you think is causing the breathing problem?      Unsure 9. OTHER SYMPTOMS: Do you have any other symptoms? (e.g., chest pain, cough, dizziness, fever, runny nose)     no 10. O2 SATURATION MONITOR:  Do you use an oxygen saturation monitor (pulse oximeter) at home? If Yes, ask: What is your reading (oxygen level) today? What is your usual oxygen saturation reading? (e.g., 95%)       95% 11.  PREGNANCY: Is there any chance you are pregnant? When was your last menstrual period?       no 12. TRAVEL: Have you traveled out of the country in the last month? (e.g., travel history, exposures)       no  Protocols used: Breathing Difficulty-A-AH

## 2024-01-20 ENCOUNTER — Encounter: Payer: Self-pay | Admitting: Pulmonary Disease

## 2024-01-20 ENCOUNTER — Ambulatory Visit (INDEPENDENT_AMBULATORY_CARE_PROVIDER_SITE_OTHER): Admitting: Pulmonary Disease

## 2024-01-20 VITALS — BP 133/89 | HR 79 | Temp 98.7°F | Ht 64.0 in | Wt 211.4 lb

## 2024-01-20 DIAGNOSIS — J4489 Other specified chronic obstructive pulmonary disease: Secondary | ICD-10-CM | POA: Diagnosis not present

## 2024-01-20 DIAGNOSIS — J4551 Severe persistent asthma with (acute) exacerbation: Secondary | ICD-10-CM | POA: Diagnosis not present

## 2024-01-20 MED ORDER — METHYLPREDNISOLONE 4 MG PO TABS: 4.0000 mg | ORAL_TABLET | Freq: Every day | ORAL | 0 refills | Status: AC

## 2024-01-20 MED ORDER — METHYLPREDNISOLONE ACETATE 80 MG/ML IJ SUSP
120.0000 mg | Freq: Once | INTRAMUSCULAR | Status: AC
  Administered 2024-01-20: 120 mg via INTRAMUSCULAR

## 2024-01-20 NOTE — Patient Instructions (Signed)
  VISIT SUMMARY: Today, we discussed your ongoing breathing difficulties and wheezing, which have worsened over the past week. We reviewed your current asthma and COPD management plan, including your inhaler regimen and recent use of prednisone . We also talked about your tobacco use and your efforts to quit smoking.  YOUR PLAN: -ASTHMA WITH ACUTE EXACERBATION AND CHRONIC OBSTRUCTIVE PULMONARY DISEASE (COPD) OVERLAP: Your asthma and COPD symptoms have worsened, likely due to missed Dupixent  doses and not having enough Ventolin . Asthma and COPD are conditions that cause difficulty breathing due to inflamed and narrowed airways. We gave you a 120 mg methylprednisolone  injection today and prescribed a Medrol  Dosepak (methylprednisolone ) 4 mg tablets for 5 days. Please use Symbicort  with Spiriva , not Advair , and make sure to resume your Dupixent  next week.  -TOBACCO USE: You are currently smoking about two cigarettes per day and are trying to quit. Smoking can worsen your breathing problems. We encourage you to continue your efforts to stop smoking.  INSTRUCTIONS: Please follow up with us  after you resume your Dupixent  injections next week to assess your symptoms. If you experience any worsening of your breathing or other concerns, contact our office immediately.

## 2024-01-20 NOTE — Addendum Note (Signed)
 Addended by: Laramie Gelles M on: 01/20/2024 03:47 PM   Modules accepted: Orders

## 2024-01-20 NOTE — Progress Notes (Signed)
 [name]    [id number]    [birth year]  Primary Care Physician:Keatts, Charmaine, NP  Referring Physician: Suanne Charmaine, NP [address] [place],  [place] [address]  Chief complaint: Acute visit for asthma, COPD exacerbation  HPI: 43 y.o. who  has a past medical history of Anemia, Anxiety, Arthritis, Asthma, Complication of anesthesia, COPD (chronic obstructive pulmonary disease) (HCC), Depression, GERD (gastroesophageal reflux disease), Headache, History of kidney stones, Hypertension, Kidney stones, Missed abortion (10/02/2013), Ovarian cyst, Pneumonia, Pregnancy induced hypertension, Suicidal intent, and Vaginal Pap smear, abnormal.  Discussed the use of AI scribe software for clinical note transcription with the patient, who gave verbal consent to proceed.  History of Present Illness [name] is a 43 year old female with asthma and COPD who presents with difficulty breathing and wheezing.  She is a patient of Dr. Meade  Dyspnea and wheezing - Difficulty breathing and wheezing persist despite current therapy. - Symptoms have worsened over the past week. - Symptoms are exacerbated during weeks without Dupixent  injections and improve when on Dupixent . - Currently out of Ventolin  inhaler due to insurance restrictions; unable to refill until September 7th. - Continues to use breathing treatments and inhalers, including Pulmicort  and albuterol  nebulizers, without significant improvement.  Asthma and copd management - Current inhaler regimen includes Spiriva , Symbicort , Advair , and Ventolin . - Unaware that Symbicort  and Advair  both contain steroids and has been using both simultaneously. - Also takes Singulair  and cetirizine. - Despite multiple therapies, symptoms remain poorly controlled.  Systemic corticosteroid use and allergic reaction - Recently prescribed prednisone  during a hospital visit for breathing difficulties. - Allergic to  prednisone  and typically does better on Medrol  or methylprednisolone  tablets. - Took prednisone  without experiencing the usual itching reaction.  Tobacco use - Currently smoking two cigarettes per day. - Actively attempting to reduce and quit smoking. - Stress and responsibilities with her children contribute to ongoing tobacco use.   Outpatient Encounter Medications as of 01/20/2024  Medication Sig   methylPREDNISolone  (MEDROL ) 4 MG tablet Take 1 tablet (4 mg total) by mouth daily for 7 days.   ADVAIR  HFA 230-21 MCG/ACT inhaler Inhale 2 puffs into the lungs 2 (two) times daily. **brand per insurance**   albuterol  (PROVENTIL ) (2.5 MG/3ML) 0.083% nebulizer solution Take 3 mLs (2.5 mg total) by nebulization every 6 (six) hours as needed for wheezing or shortness of breath.   albuterol  (VENTOLIN  HFA) 108 (90 Base) MCG/ACT inhaler Inhale 2 puffs into the lungs every 6 (six) hours as needed for wheezing or shortness of breath.   Azelastine -Fluticasone  137-50 MCG/ACT SUSP Place 1 spray into the nose in the morning and at bedtime. (Patient not taking: Reported on 01/20/2024)   budesonide  (PULMICORT ) 0.25 MG/2ML nebulizer solution INHALE 1 VIAL VIA NEBULIZER IN THE MORNING AND AT BEDTIME   busPIRone  (BUSPAR ) 10 MG tablet Take 10 mg by mouth 3 (three) times daily.   ciprofloxacin  (CIPRO ) 250 MG tablet Take 1 tablet (250 mg total) by mouth 2 (two) times daily.   clonazePAM  (KLONOPIN ) 0.5 MG tablet Take 0.5 mg by mouth 2 (two) times daily as needed.   Dupilumab  (DUPIXENT ) 300 MG/2ML SOAJ Inject 300 mg into the skin every 14 (fourteen) days.   folic acid  (FOLVITE ) 1 MG tablet Take 1 mg by mouth daily.   gabapentin  (NEURONTIN ) 300 MG capsule Take 300 mg by mouth 3 (three) times daily.   hydrOXYzine  (VISTARIL ) 25 MG capsule Take 1  capsule (25 mg total) by mouth 3 (three) times daily as needed for anxiety.   levocetirizine (XYZAL ) 5 MG tablet Take 1 tablet (5 mg total) by mouth every evening.   losartan   (COZAAR ) 50 MG tablet Take 50 mg by mouth daily.   montelukast  (SINGULAIR ) 10 MG tablet Take 1 tablet (10 mg total) by mouth daily.   ondansetron  (ZOFRAN -ODT) 4 MG disintegrating tablet Take 4 mg by mouth daily as needed.   OXTELLAR XR 300 MG TB24 Take 1 tablet by mouth daily.   pantoprazole  (PROTONIX ) 40 MG tablet Take 1 tablet (40 mg total) by mouth daily.   pantoprazole  (PROTONIX ) 40 MG tablet Take 1 tablet (40 mg total) by mouth daily.   REXULTI 1 MG TABS tablet Take 1 mg by mouth daily.   SYMBICORT  160-4.5 MCG/ACT inhaler Inhale 2 puffs into the lungs in the morning and at bedtime.   Tiotropium Bromide  Monohydrate (SPIRIVA  RESPIMAT) 1.25 MCG/ACT AERS Inhale 2 puffs into the lungs daily.   tiZANidine (ZANAFLEX) 4 MG tablet Take 4 mg by mouth every 8 (eight) hours as needed for muscle spasms.   traMADol  (ULTRAM ) 50 MG tablet Take 1 tablet (50 mg total) by mouth every 6 (six) hours as needed.   TRINTELLIX 10 MG TABS tablet Take 10 mg by mouth daily.   WEGOVY 0.5 MG/0.5ML SOAJ Inject 0.5 mg into the skin once a week.   [DISCONTINUED] predniSONE  (DELTASONE ) 20 MG tablet Take 2 tablets (40 mg total) by mouth daily with breakfast.   No facility-administered encounter medications on file as of 01/20/2024.     Physical Exam: Today's Vitals   01/20/24 1442  BP: 133/89  Pulse: 79  Temp: 98.7 F (37.1 C)  TempSrc: Temporal  SpO2: 95%  Weight: 211 lb 6.4 oz (95.9 kg)  Height: 5' 4 (1.626 m)   Body mass index is 36.29 kg/m.  Physical Exam GEN: No acute distress. CV: Regular rate and rhythm, no murmurs. LUNGS: Wheezing heard on auscultation, normal respiratory effort. SKIN JOINTS: Warm and dry, no rash.  Data Reviewed: Imaging: 01/07/2024-no active cardiopulmonary disease.  Mild scarring in the left infrahilar area I have reviewed the images personally.  PFTs: 11/16/2015 FVC 3.54 [94%], FEV1 1.96 [62%], F/F55, TLC 8.11 [160%], DLCO 23.29 [95%] Moderate obstruction  Assessment &  Plan Asthma with acute exacerbation and chronic obstructive pulmonary disease (COPD) overlap Asthma exacerbation likely due to missed Dupixent  doses and inadequate Ventolin  supply. Current treatment includes Spiriva , Symbicort , Advair , Singulair , cetirizine, Pulmicort  nebulizers, and albuterol  nebulizer. Recent prednisone  course was ineffective and caused itching, indicating a preference for methylprednisolone . Wheezing persists despite current regimen. She is due for her next Dupixent  shot next week, which typically controls her symptoms well. - Administer 120 mg methylprednisolone  injection - Prescribe Medrol  Dosepak (methylprednisolone ) 4 mg tablets for 5 days - Instruct to use Symbicort  with Spiriva , not Advair  - Ensure Dupixent  is resumed next week  Tobacco use Continues to smoke approximately two cigarettes per day. Expresses desire to quit smoking due to stress and respiratory issues. - Encourage smoking cessation  Recommendations: Depo injection Medrol  Dosepak Continue with Symbicort , Spiriva , Singulair  Resume Dupixent  Smoking cessation  Brionna Romanek MD [facility] 01/20/2024, 3:30 PM  CC: Suanne Pfeiffer, NP

## 2024-01-22 ENCOUNTER — Other Ambulatory Visit: Payer: Self-pay

## 2024-01-23 ENCOUNTER — Encounter: Payer: Self-pay | Admitting: Pulmonary Disease

## 2024-01-27 ENCOUNTER — Other Ambulatory Visit: Payer: Self-pay | Admitting: Pharmacy Technician

## 2024-01-27 ENCOUNTER — Other Ambulatory Visit: Payer: Self-pay

## 2024-01-28 ENCOUNTER — Telehealth: Payer: Self-pay

## 2024-01-28 ENCOUNTER — Other Ambulatory Visit (HOSPITAL_COMMUNITY): Payer: Self-pay

## 2024-01-28 ENCOUNTER — Other Ambulatory Visit: Payer: Self-pay

## 2024-01-28 DIAGNOSIS — J4489 Other specified chronic obstructive pulmonary disease: Secondary | ICD-10-CM

## 2024-01-28 NOTE — Progress Notes (Signed)
(  9.10 rs)  Spoke to patient to inform Dupixent  needs PA and unable to fulfill refill request to be delivered today's (01/28/24) date.  Patient will pick up medication depending on when PA is approved.   Injection is due today 01/28/24.  Message Ransom W to provide updates.  Patient requests either patient advoc or spec team member will call patient back once PA is approved.

## 2024-01-28 NOTE — Telephone Encounter (Signed)
 Called patient to discuss questions -   It may take some time for her to see symptom improvement when restarting Dupixent . She missed two doses of Dupixent  and is restarting. She would like to pick up at [facility]. Coordinated with pharmacy team.   She denies further questions.   Aleck Puls, PharmD, BCPS Clinical Pharmacist  [facility]

## 2024-01-28 NOTE — Telephone Encounter (Signed)
 Received notification from [employer] regarding a prior authorization for DUPIXENT . Authorization has been APPROVED from 01/28/24 to 01/27/25. Approval letter sent to scan center.  Per test claim, copay for 28 days supply is $4  Authorization # [id number] Phone # [phone]   Called patient to notify her of approval. She requested to have a pharmacist call back to see if the medication will start working immediately.

## 2024-01-28 NOTE — Progress Notes (Addendum)
 Aleck informed that PA is now approved and patient will pick up at [facility] today. Claim billed with today's date of service and patient set up to receive text when ready. Per Aleck patient is now symptomatic due to missing injection and needs as soon as possible.

## 2024-01-28 NOTE — Telephone Encounter (Signed)
 Copied from CRM [id number]. Topic: Clinical - Medication Question >> Jan 28, 2024  8:38 AM [name] wrote: Reason for CRM: Patient would like to know when her Dupixent  will be sent in the mail - states she's supposed to take it today. Requesting a call back.   Callback number: [phone] >> Jan 28, 2024 11:42 AM [name] wrote: Patient would like to know if she can come in and pick up her Dupixent , lost connection while on call.

## 2024-01-28 NOTE — Telephone Encounter (Signed)
 Received notification via staff message that prior authorization for Dupixent  has expired. Submitted an Urgent Prior Authorization request to [employer] for DUPIXENT  via CoverMyMeds. Will update once we receive a response.  Key: [facility]

## 2024-01-30 ENCOUNTER — Other Ambulatory Visit (HOSPITAL_COMMUNITY): Payer: Self-pay

## 2024-01-30 NOTE — Telephone Encounter (Signed)
 Patient ID:  [name] is a 43 y.o. (DOB 1980/09/04) female  E-Visit Assessment   1. Acute bacterial bronchitis    E-Visit Plan     Patient's Medications       * Accurate as of January 30, 2024  8:55 AM. Reflects encounter med changes as of last refresh          New Prescriptions      Instructions  azithromycin  250 mg tablet Commonly known as: ZITHROMAX  Z-PAK Started by: Charmaine Heller, NP  250 mg, Oral, Daily, Take two tablets day one, then take 1 tablet for next 4 days.   benzonatate  200 MG capsule Commonly known as: TESSALON  Started by: Charmaine Heller, NP  200 mg, Oral, 3 times a day as needed       Continued Medications      Instructions  * albuterol  sulfate HFA 108 (90 Base) MCG/ACT inhaler Commonly known as: PROVENTIL ,VENTOLIN ,PROAIR   2 puffs, Inhalation, Every 6 hours as needed   * albuterol  0.63 mg/3 mL nebulizer solution Commonly known as: ACCUNEB   0.63 mg, Nebulization, Every 6 hours as needed   baclofen 10 mg tablet Commonly known as: LIORESAL  10 mg, Oral, 2 times a day as needed   BELSOMRA 10 MG Tabs Generic drug: Suvorexant  1 tablet, At bedtime   busPIRone  10 mg tablet Commonly known as: BUSPAR   10 mg, Oral, 3 times daily   clonazePAM  0.5 mg tablet Commonly known as: KLONOPIN   0.5 mg, 2 times a day as needed   diclofenac sodium 75 mg EC tablet Commonly known as: VOLTAREN  75 mg, Oral, 2 times a day   dicyclomine  10 mg capsule Commonly known as: BENTYL   10 mg, Oral, 30 minutes before meals & at bedtime   gabapentin  300 mg capsule Commonly known as: NEURONTIN   300 mg, Oral, 3 times a day   hydrOXYzine  pamoate 25 mg capsule Commonly known as: VISTARIL   25 mg, Oral, 3 times a day as needed   losartan  potassium 50 mg tablet Commonly known as: COZAAR   50 mg, Oral, 2 times a day   meloxicam 7.5 mg tablet Commonly known as: MOBIC  7.5 mg, Oral, Twice a day after meals   Misc. Devices Misc  Nebulizer  machine with supplies  Dx Asthma   montelukast  10 MG tablet Commonly known as: SINGULAIR   10 mg, Oral, Daily   ondansetron  4 mg disintegrating tablet Commonly known as: ZOFRAN -ODT  4 mg, Oral, Every 8 hours as needed   OXTELLAR XR 300 MG ER tablet Generic drug: OXcarbazepine  300 mg, Daily   pantoprazole  sodium 40 mg tablet Commonly known as: PROTONIX   40 mg, Oral, Daily   semaglutide-weight management 0.5 mg/0.5 mL Soaj injection Commonly known as: WEGOVY  0.5 mg, Subcutaneous, Weekly   SPIRIVA  RESPIMAT 2.5 MCG/ACT inhaler Generic drug: tiotropium bromide   2 puffs, Inhalation, Daily   SYMBICORT  160-4.5 MCG/ACT inhaler Generic drug: budesonide -formoterol   2 puffs, Inhalation, 2 times a day, SMARTSIG:Via Inhaler   TRINTELLIX 10 MG tablet Generic drug: vortioxetine  10 mg, Daily   vibegron 75 mg Tabs tablet Commonly known as: GEMTESA  75 mg, Daily      * * This list has 2 medication(s) that are the same as other medications prescribed for you. Read the directions carefully, and ask your doctor or other care provider to review them with you.           Risk, benefits, and alternatives were provided through patient instructions given  to the patient electronically.  If any worsening symptoms or lack of improvement, the patient will seek immediate medical care.  E-Visit History  HPI is reviewed through E-Visit questionnaire.  Patient is appropriate for E-Visit and consented online understanding potential risks, benefits and alternatives of E-Visit delivery of care.  Patient Active Problem List   Diagnosis Date Noted  . Chronic bilateral low back pain without sciatica 06/17/2023  . Sacroiliitis 06/17/2023  . Arthropathy of lumbar facet joint 06/17/2023  . Overactive bladder 04/30/2023  . Drug-induced constipation 04/30/2023  . Pericardial effusion (*) 05/28/2022    Last Assessment & Plan: Formatting of this note might be different from the original. Trace in  size.  No chest pain, doubt pericarditis. - Attn on follow up imaging   . Obesity (BMI 30-39.9) 05/28/2022  . Cigarette nicotine  dependence with nicotine -induced disorder 04/23/2022  . Other secondary osteoarthritis of right foot 02/18/2022  . Alcohol use disorder, moderate, in controlled environment (*) 01/30/2022  . Chronic obstructive pulmonary disease with acute exacerbation (*) 09/14/2021  . Hallux rigidus of right foot 08/23/2021    Added automatically from request for surgery [id number]   . 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 Hallux valgus, left 08/23/2021    Added automatically from request for surgery [id number]   . Iron deficiency anemia 06/29/2021  . Severe episode of recurrent major depressive disorder, without psychotic features (*) 11/29/2020  . Abnormal LFTs 03/13/2020  . Essential hypertension, benign 07/29/2014  . Severe persistent asthma with acute exacerbation (*) 07/29/2014    Last Assessment & Plan:   Formatting of this note is different from the original.  Asthma does not appear to be optimally controlled on QVAR    Trial of Symbicort    Advised will need to change if plans for pregnancy occur.     Plan   Patient Instructions   Change QVAR  to Symbicort  2 puffs Twice daily  , rinse after use.   Begin Zyrtec 10mg  At bedtime  As needed  Drainage .   Follow up Dr. Geronimo in 6-8 weeks and As needed    Please contact office for sooner follow up if symptoms do not improve or worsen or seek emergency care   . Anxiety 03/24/2012   Allergies  Allergen Reactions  . Amlodipine  Itching  . Amlodipine  Besylate Anaphylaxis    Hypersensitivity? Tip of tongue numb and flushing.  . Oxycodone  Itching  . Penicillins Nausea And Vomiting and Rash    UNSPECIFIED REACTION  Has patient had a PCN reaction causing immediate rash,  facial/tongue/throat swelling, SOB or lightheadedness with hypotension:No Has patient had a PCN reaction causing severe rash involving mucus membranes or skin necrosis:No Has patient had a PCN reaction that REACTION THAT REQUIRED HOSPITALIZATION:  #  #  #  YES  #  #  #  Has patient had a PCN reaction occurring within the last 10 years: #  #  #  YES  #  #  #   . Xanax Itching  . Prednisone  Other and Swelling    FACIAL swelling- breathing not affected, though    E-Visit Required Time Documentation  I spent 5 minutes on this issue.    *Some images could not be shown.

## 2024-01-30 NOTE — Progress Notes (Signed)
 PA has been approved and medication was dispensed.

## 2024-02-02 ENCOUNTER — Other Ambulatory Visit (HOSPITAL_COMMUNITY): Payer: Self-pay

## 2024-02-02 NOTE — Telephone Encounter (Signed)
 Medication was dispensed on 9/10

## 2024-02-06 ENCOUNTER — Encounter: Payer: Self-pay | Admitting: Internal Medicine

## 2024-02-09 ENCOUNTER — Encounter: Payer: Self-pay | Admitting: Internal Medicine

## 2024-02-09 ENCOUNTER — Other Ambulatory Visit: Payer: Self-pay

## 2024-02-09 ENCOUNTER — Ambulatory Visit: Payer: Self-pay

## 2024-02-09 ENCOUNTER — Emergency Department (HOSPITAL_COMMUNITY)

## 2024-02-09 ENCOUNTER — Emergency Department (HOSPITAL_COMMUNITY)
Admission: EM | Admit: 2024-02-09 | Discharge: 2024-02-09 | Disposition: A | Discharge status: Home / Self Care | Attending: Emergency Medicine | Admitting: Emergency Medicine

## 2024-02-09 ENCOUNTER — Encounter (HOSPITAL_COMMUNITY): Payer: Self-pay

## 2024-02-09 DIAGNOSIS — J4489 Other specified chronic obstructive pulmonary disease: Secondary | ICD-10-CM | POA: Diagnosis not present

## 2024-02-09 DIAGNOSIS — J40 Bronchitis, not specified as acute or chronic: Secondary | ICD-10-CM | POA: Diagnosis not present

## 2024-02-09 DIAGNOSIS — Z7951 Long term (current) use of inhaled steroids: Secondary | ICD-10-CM | POA: Insufficient documentation

## 2024-02-09 DIAGNOSIS — R0602 Shortness of breath: Secondary | ICD-10-CM | POA: Diagnosis present

## 2024-02-09 LAB — CBC
HCT: 43.2 % (ref 36.0–46.0)
Hemoglobin: 13.5 g/dL (ref 12.0–15.0)
MCH: 29.2 pg (ref 26.0–34.0)
MCHC: 31.3 g/dL (ref 30.0–36.0)
MCV: 93.3 fL (ref 80.0–100.0)
Platelets: 374 K/uL (ref 150–400)
RBC: 4.63 MIL/uL (ref 3.87–5.11)
RDW: 14.5 % (ref 11.5–15.5)
WBC: 13.6 K/uL — ABNORMAL HIGH (ref 4.0–10.5)
nRBC: 0 % (ref 0.0–0.2)

## 2024-02-09 LAB — TROPONIN T, HIGH SENSITIVITY
Troponin T High Sensitivity: <15 ng/L (ref 0–19)
Troponin T High Sensitivity: <15 ng/L (ref 0–19)

## 2024-02-09 LAB — BASIC METABOLIC PANEL WITH GFR
Anion gap: 16 — ABNORMAL HIGH (ref 5–15)
BUN: 12 mg/dL (ref 6–20)
CO2: 20 mmol/L — ABNORMAL LOW (ref 22–32)
Calcium: 9.2 mg/dL (ref 8.9–10.3)
Chloride: 101 mmol/L (ref 98–111)
Creatinine, Ser: 1.25 mg/dL — ABNORMAL HIGH (ref 0.44–1.00)
GFR, Estimated: 55 mL/min — ABNORMAL LOW (ref >60)
Glucose, Bld: 161 mg/dL — ABNORMAL HIGH (ref 70–99)
Potassium: 3.9 mmol/L (ref 3.5–5.1)
Sodium: 137 mmol/L (ref 135–145)

## 2024-02-09 LAB — HCG, SERUM, QUALITATIVE: Preg, Serum: NEGATIVE

## 2024-02-09 LAB — I-STAT CG4 LACTIC ACID, ED: Lactic Acid, Venous: 0.8 mmol/L (ref 0.5–1.9)

## 2024-02-09 MED ORDER — DOXYCYCLINE HYCLATE 100 MG PO CAPS: 100.0000 mg | ORAL_CAPSULE | Freq: Two times a day (BID) | ORAL | 0 refills | Status: DC

## 2024-02-09 MED ORDER — CLONAZEPAM 0.5 MG PO TABS
0.5000 mg | ORAL_TABLET | Freq: Once | ORAL | Status: AC
  Administered 2024-02-09: 0.5 mg via ORAL
  Filled 2024-02-09: qty 1

## 2024-02-09 MED ORDER — SODIUM CHLORIDE 0.9 % IV BOLUS
1000.0000 mL | Freq: Once | INTRAVENOUS | Status: AC
  Administered 2024-02-09: 1000 mL via INTRAVENOUS

## 2024-02-09 MED ORDER — ACETAMINOPHEN 325 MG PO TABS
650.0000 mg | ORAL_TABLET | Freq: Once | ORAL | Status: AC
  Administered 2024-02-09: 650 mg via ORAL
  Filled 2024-02-09: qty 2

## 2024-02-09 MED ORDER — COMPRESSOR/NEBULIZER MISC: 0 refills | Status: AC

## 2024-02-09 MED ORDER — SODIUM CHLORIDE 0.9 % IV SOLN
1.0000 g | Freq: Once | INTRAVENOUS | Status: AC
  Administered 2024-02-09: 1 g via INTRAVENOUS
  Filled 2024-02-09: qty 10

## 2024-02-09 MED ORDER — COMPRESSOR/NEBULIZER MISC: 0 refills | Status: DC

## 2024-02-09 MED ORDER — IPRATROPIUM-ALBUTEROL 0.5-2.5 (3) MG/3ML IN SOLN
3.0000 mL | Freq: Once | RESPIRATORY_TRACT | Status: AC
  Administered 2024-02-09: 3 mL via RESPIRATORY_TRACT
  Filled 2024-02-09: qty 3

## 2024-02-09 MED ORDER — IOHEXOL 350 MG/ML SOLN
75.0000 mL | Freq: Once | INTRAVENOUS | Status: AC | PRN
  Administered 2024-02-09: 75 mL via INTRAVENOUS
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
## 2024-02-09 NOTE — Discharge Instructions (Addendum)
Return if any problems.  See your Physician for recheck  °

## 2024-02-09 NOTE — ED Triage Notes (Signed)
 Pt came in with SOB x4 weeks, chest pain x3 weeks and a cough. Pt stated she's also very stressed and smokes cigarettes because of the stressors. Pt is also warm to touch.  Hx COPD

## 2024-02-10 NOTE — ED Provider Notes (Signed)
 [facility] AT [facility] Provider Note   CSN: [id number] Arrival date & time: 02/09/24  1450     Patient presents with: Shortness of Breath, Cough, and Chest Pain   [name] is a 43 y.o. female.   Patient complains of wheezing and shortness of breath.  Patient reports that she has a history of asthma and COPD.  Patient reports she recently was treated with Zithromax .  Patient states that her home nebulizer is not working.  She has been using her albuterol  inhaler without relief.  Patient has been on multiple rounds of steroids.  Patient states that she continues to smoke.  She has been followed by pulmonology.  Patient is concerned that she needs to be in the hospital to get her breathing better.  Patient denies any chest pain.  She has not had any nausea or vomiting.  Patient has not had a fever  The history is provided by the patient. No language interpreter was used.  Shortness of Breath Associated symptoms: chest pain and cough   Cough Associated symptoms: chest pain and shortness of breath   Chest Pain Associated symptoms: cough and shortness of breath        Prior to Admission medications   Medication Sig Start Date End Date Taking? Authorizing Provider  ADVAIR  HFA 230-21 MCG/ACT inhaler Inhale 2 puffs into the lungs 2 (two) times daily. **brand per insurance** 08/19/23   Meade Verdon RAMAN, MD  albuterol  (PROVENTIL ) (2.5 MG/3ML) 0.083% nebulizer solution Take 3 mLs (2.5 mg total) by nebulization every 6 (six) hours as needed for wheezing or shortness of breath. 07/18/23   Cobb, Comer GAILS, NP  albuterol  (VENTOLIN  HFA) 108 (90 Base) MCG/ACT inhaler Inhale 2 puffs into the lungs every 6 (six) hours as needed for wheezing or shortness of breath. 03/26/23   Kingsley, Victoria K, DO  Azelastine -Fluticasone  137-50 MCG/ACT SUSP Place 1 spray into the nose in the morning and at bedtime. Patient not taking: Reported on 01/20/2024 08/19/23   Meade Verdon RAMAN, MD  budesonide  (PULMICORT ) 0.25 MG/2ML nebulizer solution INHALE 1 VIAL VIA NEBULIZER IN THE MORNING AND AT BEDTIME 01/01/24   Meade Verdon RAMAN, MD  busPIRone  (BUSPAR ) 10 MG tablet Take 10 mg by mouth 3 (three) times daily. 08/11/21   [provider]  ciprofloxacin  (CIPRO ) 250 MG tablet Take 1 tablet (250 mg total) by mouth 2 (two) times daily. 12/23/23   Gaston Hamilton, MD  clonazePAM  (KLONOPIN ) 0.5 MG tablet Take 0.5 mg by mouth 2 (two) times daily as needed. 12/05/23   [provider]  doxycycline  (VIBRAMYCIN ) 100 MG capsule Take 1 capsule (100 mg total) by mouth 2 (two) times daily. 02/09/24   Flint Sonny POUR, PA-C  Dupilumab  (DUPIXENT ) 300 MG/2ML SOAJ Inject 300 mg into the skin every 14 (fourteen) days. 07/30/23   Desai, Nikita S, MD  folic acid  (FOLVITE ) 1 MG tablet Take 1 mg by mouth daily. 07/16/23   [provider]  gabapentin  (NEURONTIN ) 300 MG capsule Take 300 mg by mouth 3 (three) times daily. 06/17/23   [provider]  hydrOXYzine  (VISTARIL ) 25 MG capsule Take 1 capsule (25 mg total) by mouth 3 (three) times daily as needed for anxiety. 06/22/22   Briana Elgin LABOR, MD  levocetirizine (XYZAL ) 5 MG tablet Take 1 tablet (5 mg total) by mouth every evening. 10/07/23   Desai, Nikita S, MD  losartan  (COZAAR ) 50 MG tablet Take 50 mg by mouth daily.  [provider]  montelukast  (SINGULAIR ) 10 MG tablet Take 1 tablet (10 mg total) by mouth daily. 08/19/23   Desai, Nikita S, MD  Nebulizers (COMPRESSOR/NEBULIZER) MISC Use as directed 02/09/24   Laikynn Pollio K, PA-C  ondansetron  (ZOFRAN -ODT) 4 MG disintegrating tablet Take 4 mg by mouth daily as needed.    [provider]  OXTELLAR XR 300 MG TB24 Take 1 tablet by mouth daily. 09/24/23   [provider]  pantoprazole  (PROTONIX ) 40 MG tablet Take 1 tablet (40 mg total) by mouth daily. 08/19/23   Meade Verdon RAMAN, MD  pantoprazole  (PROTONIX ) 40 MG tablet Take 1 tablet (40 mg total) by mouth  daily. 12/31/23   Meade Verdon RAMAN, MD  REXULTI 1 MG TABS tablet Take 1 mg by mouth daily. 12/08/23   [provider]  SYMBICORT  160-4.5 MCG/ACT inhaler Inhale 2 puffs into the lungs in the morning and at bedtime. 11/27/23   [provider]  Tiotropium Bromide  Monohydrate (SPIRIVA  RESPIMAT) 1.25 MCG/ACT AERS Inhale 2 puffs into the lungs daily. 10/03/23   Desai, Nikita S, MD  tiZANidine (ZANAFLEX) 4 MG tablet Take 4 mg by mouth every 8 (eight) hours as needed for muscle spasms. 10/15/23   [provider]  traMADol  (ULTRAM ) 50 MG tablet Take 1 tablet (50 mg total) by mouth every 6 (six) hours as needed. 12/23/23 12/22/24  MacDiarmidGlendia, MD  TRINTELLIX 10 MG TABS tablet Take 10 mg by mouth daily. 11/10/23   [provider]  WEGOVY 0.5 MG/0.5ML SOAJ Inject 0.5 mg into the skin once a week.    [provider]    Allergies: Alprazolam, Oxycodone , Penicillins, and Norvasc  [amlodipine  besylate]    Review of Systems  Respiratory:  Positive for cough and shortness of breath.   Cardiovascular:  Positive for chest pain.  All other systems reviewed and are negative.   Updated Vital Signs BP 120/84 (BP Location: Left Arm)   Pulse 73   Temp 97.7 F (36.5 C) (Oral)   Resp 18   LMP 02/09/2024 (Approximate)   SpO2 94%   Physical Exam Vitals and nursing note reviewed.  Constitutional:      Appearance: She is well-developed.  HENT:     Head: Normocephalic.  Cardiovascular:     Rate and Rhythm: Regular rhythm. Tachycardia present.  Pulmonary:     Effort: Pulmonary effort is normal.     Breath sounds: Examination of the right-lower field reveals wheezing. Examination of the left-lower field reveals wheezing. Wheezing present.  Abdominal:     General: There is no distension.  Musculoskeletal:        General: Normal range of motion.     Cervical back: Normal range of motion.  Skin:    General: Skin is warm.  Neurological:     General: No focal deficit  present.     Mental Status: She is alert and oriented to person, place, and time.     (all labs ordered are listed, but only abnormal results are displayed) Labs Reviewed  BASIC METABOLIC PANEL WITH GFR - Abnormal; Notable for the following components:      Result Value   CO2 20 (*)    Glucose, Bld 161 (*)    Creatinine, Ser 1.25 (*)    GFR, Estimated 55 (*)    Anion gap 16 (*)    All other components within normal limits  CBC - Abnormal; Notable for the following components:   WBC 13.6 (*)    All other  components within normal limits  HCG, SERUM, QUALITATIVE  I-STAT CG4 LACTIC ACID, ED  TROPONIN T, HIGH SENSITIVITY  TROPONIN T, HIGH SENSITIVITY    EKG: EKG Interpretation Date/Time:  Monday February 09 2024 15:10:34 EDT Ventricular Rate:  129 PR Interval:  135 QRS Duration:  87 QT Interval:  301 QTC Calculation: 441 R Axis:   68  Text Interpretation: Sinus tachycardia Probable left atrial enlargement Low voltage, precordial leads Confirmed by Bari Flank [phone]) on 02/09/2024 6:44:19 PM  Radiology: CT Angio Chest PE W and/or Wo Contrast Result Date: 02/09/2024 CLINICAL DATA:  High probability for PE. EXAM: CT ANGIOGRAPHY CHEST WITH CONTRAST TECHNIQUE: Multidetector CT imaging of the chest was performed using the standard protocol during bolus administration of intravenous contrast. Multiplanar CT image reconstructions and MIPs were obtained to evaluate the vascular anatomy. RADIATION DOSE REDUCTION: This exam was performed according to the departmental dose-optimization program which includes automated exposure control, adjustment of the mA and/or kV according to patient size and/or use of iterative reconstruction technique. CONTRAST:  75mL OMNIPAQUE  IOHEXOL  350 MG/ML SOLN COMPARISON:  CT angiogram chest 05/24/2022. FINDINGS: Cardiovascular: Satisfactory opacification of the pulmonary arteries to the segmental level. No evidence of pulmonary embolism. Normal heart size. No  pericardial effusion. Mediastinum/Nodes: No enlarged mediastinal, hilar, or axillary lymph nodes. Thyroid  gland, trachea, and esophagus demonstrate no significant findings. Lungs/Pleura: There some atelectasis in the inferior upper lobes bilaterally. There some faint ground-glass opacities in the bilateral upper lobes and bilateral lower lobes. The lungs are otherwise clear. There is no pleural effusion or pneumothorax. Upper Abdomen: No acute abnormality.  There is right renal scarring. Musculoskeletal: No chest wall abnormality. No acute or significant osseous findings. Review of the MIP images confirms the above findings. IMPRESSION: 1. No evidence for pulmonary embolism. 2. Faint ground-glass opacities in the bilateral upper lobes and bilateral lower lobes, likely infectious/inflammatory. Electronically Signed   By: Greig Pique M.D.   On: 02/09/2024 18:32   DG Chest 2 View Result Date: 02/09/2024 EXAM: 2 VIEW(S) XRAY OF THE CHEST 02/09/2024 04:14:00 PM COMPARISON: 01/07/2024 CLINICAL HISTORY: Shortness of Breath, Chest Pain. Pt states 3 weeks coughing and wheezing. FINDINGS: LUNGS AND PLEURA: No focal pulmonary opacity. No pulmonary edema. No pleural effusion. No pneumothorax. HEART AND MEDIASTINUM: No acute abnormality of the cardiac and mediastinal silhouettes. BONES AND SOFT TISSUES: No acute osseous abnormality. IMPRESSION: 1. No acute cardiopulmonary process. Electronically signed by: Waddell Calk MD 02/09/2024 04:47 PM EDT RP Workstation: [id number]     Procedures   Medications Ordered in the ED  clonazePAM  (KLONOPIN ) tablet 0.5 mg (0.5 mg Oral Given 02/09/24 1636)  iohexol  (OMNIPAQUE ) 350 MG/ML injection 75 mL (75 mLs Intravenous Contrast Given 02/09/24 1804)  sodium chloride  0.9 % bolus 1,000 mL (0 mLs Intravenous Stopped 02/09/24 2129)  cefTRIAXone  (ROCEPHIN ) 1 g in sodium chloride  0.9 % 100 mL IVPB (0 g Intravenous Stopped 02/09/24 2031)  ipratropium-albuterol  (DUONEB) 0.5-2.5 (3) MG/3ML  nebulizer solution 3 mL (3 mLs Nebulization Given 02/09/24 2010)  acetaminophen  (TYLENOL ) tablet 650 mg (650 mg Oral Given 02/09/24 2010)                                    Medical Decision Making Patient complains of shortness of breath.  Patient reports that she has recently finished an antibiotic.  She is having persistent shortness of breath and wheezing.  Patient presented to the emergency department tachycardic with heart  rate in the 140s.  Patient denies any swelling in her legs.  No history of PE  Amount and/or Complexity of Data Reviewed Independent Historian:     Details: Patient is here with her family who is supportive Labs: ordered. Decision-making details documented in ED Course.    Details: Labs ordered reviewed and interpreted.  CO2 is 20 creatinine is 1.25. Radiology: ordered and independent interpretation performed. Decision-making details documented in ED Course.    Details: Chest x-ray shows no acute findings. CT angio chest obtained due to patient's persistent tachycardia and shortness of breath. CT scan shows ground glass opacities in bilateral upper and lower lobes that may be inflammatory versus infectious.  Risk OTC drugs. Prescription drug management. Risk Details: Patient is given IV fluids x 1 L.  She is given Rocephin  IV.  Patient's oxygen saturations have remained between 95 and 100.  Patient was given a DuoNeb.  Patient may have early pneumonia versus bronchitis.  I have prescribed the patient a new nebulizer hopefully she can pick this up tonight.  Patient is given a prescription for doxycycline .  She is counseled on the necessity of stopping smoking.  Patient is advised to follow-up with her primary care physician for recheck of elevated glucose and to follow-up with her pulmonologist for ongoing treatment.  Patient is discharged in stable condition.        Final diagnoses:  Bronchitis  An After Visit Summary was printed and given to the patient.   ED  Discharge Orders          Ordered    doxycycline  (VIBRAMYCIN ) 100 MG capsule  2 times daily,   Status:  Discontinued        02/09/24 2016    Nebulizers (COMPRESSOR/NEBULIZER) MISC  Status:  Discontinued        02/09/24 2016    doxycycline  (VIBRAMYCIN ) 100 MG capsule  2 times daily        02/09/24 2032    Nebulizers (COMPRESSOR/NEBULIZER) MISC        02/09/24 2032               Flint Sonny POUR, PA-C 02/10/24 [id number]    Horton, Roxie HERO, DO 02/17/24 2239

## 2024-02-15 ENCOUNTER — Emergency Department (HOSPITAL_BASED_OUTPATIENT_CLINIC_OR_DEPARTMENT_OTHER)
Admission: EM | Admit: 2024-02-15 | Discharge: 2024-02-15 | Disposition: A | Discharge status: Home / Self Care | Attending: Emergency Medicine | Admitting: Emergency Medicine

## 2024-02-15 ENCOUNTER — Emergency Department (HOSPITAL_BASED_OUTPATIENT_CLINIC_OR_DEPARTMENT_OTHER)

## 2024-02-15 ENCOUNTER — Encounter (HOSPITAL_BASED_OUTPATIENT_CLINIC_OR_DEPARTMENT_OTHER): Payer: Self-pay | Admitting: Emergency Medicine

## 2024-02-15 ENCOUNTER — Other Ambulatory Visit: Payer: Self-pay

## 2024-02-15 DIAGNOSIS — I10 Essential (primary) hypertension: Secondary | ICD-10-CM | POA: Insufficient documentation

## 2024-02-15 DIAGNOSIS — W182XXA Fall in (into) shower or empty bathtub, initial encounter: Secondary | ICD-10-CM | POA: Diagnosis not present

## 2024-02-15 DIAGNOSIS — J449 Chronic obstructive pulmonary disease, unspecified: Secondary | ICD-10-CM | POA: Insufficient documentation

## 2024-02-15 DIAGNOSIS — S0003XA Contusion of scalp, initial encounter: Secondary | ICD-10-CM | POA: Insufficient documentation

## 2024-02-15 DIAGNOSIS — Z7951 Long term (current) use of inhaled steroids: Secondary | ICD-10-CM | POA: Insufficient documentation

## 2024-02-15 DIAGNOSIS — Z79899 Other long term (current) drug therapy: Secondary | ICD-10-CM | POA: Insufficient documentation

## 2024-02-15 DIAGNOSIS — S060X1A Concussion with loss of consciousness of 30 minutes or less, initial encounter: Secondary | ICD-10-CM | POA: Insufficient documentation

## 2024-02-15 DIAGNOSIS — S0990XA Unspecified injury of head, initial encounter: Secondary | ICD-10-CM | POA: Diagnosis present

## 2024-02-15 LAB — HCG, SERUM, QUALITATIVE: Preg, Serum: NEGATIVE

## 2024-02-15 MED ORDER — DEXAMETHASONE SODIUM PHOSPHATE 10 MG/ML IJ SOLN
10.0000 mg | Freq: Once | INTRAMUSCULAR | Status: AC
  Administered 2024-02-15: 10 mg via INTRAVENOUS
  Filled 2024-02-15: qty 1

## 2024-02-15 MED ORDER — ONDANSETRON HCL 4 MG PO TABS: 4.0000 mg | ORAL_TABLET | Freq: Three times a day (TID) | ORAL | 0 refills | Status: AC | PRN

## 2024-02-15 MED ORDER — MECLIZINE HCL 12.5 MG PO TABS: 12.5000 mg | ORAL_TABLET | Freq: Three times a day (TID) | ORAL | 0 refills | Status: AC | PRN

## 2024-02-15 MED ORDER — ACETAMINOPHEN 500 MG PO TABS
1000.0000 mg | ORAL_TABLET | Freq: Once | ORAL | Status: AC
  Administered 2024-02-15: 1000 mg via ORAL
  Filled 2024-02-15: qty 2

## 2024-02-15 MED ORDER — LACTATED RINGERS IV BOLUS
1000.0000 mL | Freq: Once | INTRAVENOUS | Status: AC
  Administered 2024-02-15: 1000 mL via INTRAVENOUS

## 2024-02-15 MED ORDER — PROCHLORPERAZINE EDISYLATE 10 MG/2ML IJ SOLN
10.0000 mg | Freq: Once | INTRAMUSCULAR | Status: AC
  Administered 2024-02-15: 10 mg via INTRAVENOUS
  Filled 2024-02-15: qty 2

## 2024-02-15 MED ORDER — KETOROLAC TROMETHAMINE 15 MG/ML IJ SOLN
15.0000 mg | Freq: Once | INTRAMUSCULAR | Status: AC
  Administered 2024-02-15: 15 mg via INTRAVENOUS
  Filled 2024-02-15: qty 1

## 2024-02-15 NOTE — Discharge Instructions (Addendum)
 [name]  Thank you for allowing us  to take care of you today.  You came to the Emergency Department today because you fell several days ago and have had a headache since then as well as some swelling on your scalp.  Here in the emergency department we gave you some medicine for your headache, as well as did a CT to evaluate for any bleeding or broken bones.  Fortunately the CT head did not show any injuries.  Concussion is something that is based off of symptoms, not on imaging, and can present with symptoms like fatigue, nausea, dizziness, blurry vision, confusion etc.  Your symptoms are most likely due to a concussion.  Will prescribe you Zofran  that you can take as needed for nausea, and meclizine  that you can take as needed for dizziness.  We recommend Tylenol  and ibuprofen  for your headache.  You should rest until your headache resolves, then gradually increase your activity in a stepwise fashion until you are able to do each level of activity without headache.  You can follow-up with your primary care doctor to further guide this return to activity.  You should not participate in any contact activities where you can potentially get a another concussion until your current concussion is fully resolved.[name]  To-Do: 1. Please follow-up with your primary doctor within 1 to 2 weeks / as soon as possible.   Please return to the Emergency Department or call 911 if you experience have worsening of your symptoms, or do not get better, difficulty moving or feeling part of your body that usually able to move or feel, chest pain, shortness of breath, severe or significantly worsening pain, high fever, severe confusion, pass out or have any reason to think that you need emergency medical care.   We hope you feel better soon.   Mitzie Later, MD Department of Emergency Medicine [facility]

## 2024-02-15 NOTE — ED Notes (Signed)
 Pt wanted to leave before fluids were fully done... Provider informed.SABRASABRA

## 2024-02-15 NOTE — ED Provider Notes (Signed)
 [facility] AT [facility] Provider Note   CSN: [id number] Arrival date & time: 02/15/24  0854     History Chief Complaint  Patient presents with   Head Injury    HPI: [name] is a 43 y.o. female with history pertinent for depression, anxiety, COPD, GERD, HTN who presents complaining of headache. Patient arrived via POV accompanied by daughter.  History provided by patient.  No interpreter required during this encounter.  Patient reports that on 9/25 she was in the shower when she had a slip and fall.  Reports that she hit her hide left parietal scalp, believes that she did have associated loss of consciousness.  Denies any difficulty moving or feeling her body, however reports that she is continue to have a severe headache and significant fatigue since the injury.  Reports that she became increasingly concerned for acute intracranial process, therefore wanted to come to the emergency department for imaging.  Denies focal pain elsewhere in her body, specifically denies neck pain, has been ambulatory since the event  Patient's recorded medical, surgical, social, medication list and allergies were reviewed in the Snapshot window as part of the initial history.   Prior to Admission medications   Medication Sig Start Date End Date Taking? Authorizing Provider  meclizine  (ANTIVERT ) 12.5 MG tablet Take 1 tablet (12.5 mg total) by mouth 3 (three) times daily as needed for dizziness. 02/15/24  Yes Rogelia Jerilynn RAMAN, MD  ondansetron  (ZOFRAN ) 4 MG tablet Take 1 tablet (4 mg total) by mouth every 8 (eight) hours as needed for nausea or vomiting. 02/15/24  Yes Rogelia Jerilynn RAMAN, MD  ADVAIR  HFA 230-21 MCG/ACT inhaler Inhale 2 puffs into the lungs 2 (two) times daily. **brand per insurance** 08/19/23   Meade Verdon RAMAN, MD  albuterol  (PROVENTIL ) (2.5 MG/3ML) 0.083% nebulizer solution Take 3 mLs (2.5 mg total) by nebulization every 6 (six) hours as needed for wheezing  or shortness of breath. 07/18/23   Cobb, Comer GAILS, NP  albuterol  (VENTOLIN  HFA) 108 (90 Base) MCG/ACT inhaler Inhale 2 puffs into the lungs every 6 (six) hours as needed for wheezing or shortness of breath. 03/26/23   Kingsley, Victoria K, DO  Azelastine -Fluticasone  137-50 MCG/ACT SUSP Place 1 spray into the nose in the morning and at bedtime. Patient not taking: Reported on 01/20/2024 08/19/23   Meade Verdon RAMAN, MD  budesonide  (PULMICORT ) 0.25 MG/2ML nebulizer solution INHALE 1 VIAL VIA NEBULIZER IN THE MORNING AND AT BEDTIME 01/01/24   Meade Verdon RAMAN, MD  busPIRone  (BUSPAR ) 10 MG tablet Take 10 mg by mouth 3 (three) times daily. 08/11/21   [provider]  ciprofloxacin  (CIPRO ) 250 MG tablet Take 1 tablet (250 mg total) by mouth 2 (two) times daily. 12/23/23   Gaston Hamilton, MD  clonazePAM  (KLONOPIN ) 0.5 MG tablet Take 0.5 mg by mouth 2 (two) times daily as needed. 12/05/23   [provider]  doxycycline  (VIBRAMYCIN ) 100 MG capsule Take 1 capsule (100 mg total) by mouth 2 (two) times daily. 02/09/24   Sofia, Leslie K, PA-C  Dupilumab  (DUPIXENT ) 300 MG/2ML SOAJ Inject 300 mg into the skin every 14 (fourteen) days. 07/30/23   Meade Verdon RAMAN, MD  folic acid  (FOLVITE ) 1 MG tablet Take 1 mg by mouth daily. 07/16/23   [provider]  gabapentin  (NEURONTIN ) 300 MG capsule Take 300 mg by mouth 3 (three) times daily. 06/17/23   [provider]  hydrOXYzine  (VISTARIL ) 25 MG capsule Take 1 capsule (25 mg total)  by mouth 3 (three) times daily as needed for anxiety. 06/22/22   Briana Elgin LABOR, MD  levocetirizine (XYZAL ) 5 MG tablet Take 1 tablet (5 mg total) by mouth every evening. 10/07/23   Meade Verdon RAMAN, MD  losartan  (COZAAR ) 50 MG tablet Take 50 mg by mouth daily.    [provider]  montelukast  (SINGULAIR ) 10 MG tablet Take 1 tablet (10 mg total) by mouth daily. 08/19/23   Desai, Nikita S, MD  Nebulizers (COMPRESSOR/NEBULIZER) MISC Use as directed 02/09/24   Sofia,  Leslie K, PA-C  ondansetron  (ZOFRAN -ODT) 4 MG disintegrating tablet Take 4 mg by mouth daily as needed.    [provider]  OXTELLAR XR 300 MG TB24 Take 1 tablet by mouth daily. 09/24/23   [provider]  pantoprazole  (PROTONIX ) 40 MG tablet Take 1 tablet (40 mg total) by mouth daily. 08/19/23   Meade Verdon RAMAN, MD  pantoprazole  (PROTONIX ) 40 MG tablet Take 1 tablet (40 mg total) by mouth daily. 12/31/23   Meade Verdon RAMAN, MD  REXULTI 1 MG TABS tablet Take 1 mg by mouth daily. 12/08/23   [provider]  SYMBICORT  160-4.5 MCG/ACT inhaler Inhale 2 puffs into the lungs in the morning and at bedtime. 11/27/23   [provider]  Tiotropium Bromide  Monohydrate (SPIRIVA  RESPIMAT) 1.25 MCG/ACT AERS Inhale 2 puffs into the lungs daily. 10/03/23   Desai, Nikita S, MD  tiZANidine (ZANAFLEX) 4 MG tablet Take 4 mg by mouth every 8 (eight) hours as needed for muscle spasms. 10/15/23   [provider]  traMADol  (ULTRAM ) 50 MG tablet Take 1 tablet (50 mg total) by mouth every 6 (six) hours as needed. 12/23/23 12/22/24  MacDiarmidGlendia, MD  TRINTELLIX 10 MG TABS tablet Take 10 mg by mouth daily. 11/10/23   [provider]  WEGOVY 0.5 MG/0.5ML SOAJ Inject 0.5 mg into the skin once a week.    [provider]     Allergies: Alprazolam, Oxycodone , Penicillins, and Norvasc  [amlodipine  besylate]   Review of Systems   ROS as per HPI  Physical Exam Updated Vital Signs BP 101/65 (BP Location: Left Arm)   Pulse 65   Temp 98 F (36.7 C) (Oral)   Resp 18   Ht 5' 4 (1.626 m)   Wt 95.3 kg   LMP 02/09/2024 (Approximate)   SpO2 93%   BMI 36.05 kg/m  Physical Exam Vitals and nursing note reviewed.  Constitutional:      General: She is not in acute distress.    Appearance: She is well-developed.  HENT:     Head: Normocephalic.     Comments: Small palpable hematoma to the hide left parietal scalp, no periorbital ecchymosis or Battle sign Eyes:      Conjunctiva/sclera: Conjunctivae normal.  Cardiovascular:     Rate and Rhythm: Normal rate and regular rhythm.     Heart sounds: No murmur heard. Pulmonary:     Effort: Pulmonary effort is normal. No respiratory distress.     Breath sounds: Normal breath sounds.  Abdominal:     Palpations: Abdomen is soft.     Tenderness: There is no abdominal tenderness.  Musculoskeletal:        General: No swelling.     Cervical back: Neck supple. No bony tenderness. No pain with movement.     Thoracic back: No bony tenderness.     Lumbar back: No bony tenderness.  Skin:    General: Skin is warm and dry.  Capillary Refill: Capillary refill takes less than 2 seconds.  Neurological:     Mental Status: She is alert.  Psychiatric:        Mood and Affect: Mood normal.     ED Course/ Medical Decision Making/ A&P    Procedures Procedures   Medications Ordered in ED Medications  dexamethasone  (DECADRON ) injection 10 mg (10 mg Intravenous Given 02/15/24 1018)  prochlorperazine  (COMPAZINE ) injection 10 mg (10 mg Intravenous Given 02/15/24 1021)  acetaminophen  (TYLENOL ) tablet 1,000 mg (1,000 mg Oral Given 02/15/24 1021)  lactated ringers  bolus 1,000 mL (0 mLs Intravenous Stopped 02/15/24 1130)  ketorolac  (TORADOL ) 15 MG/ML injection 15 mg (15 mg Intravenous Given 02/15/24 1104)    Medical Decision Making:   CHIFFON KITTLESON is a 43 y.o. female who presents for recent fall with headache as per above.  Physical exam is pertinent for small palpable hematoma to hide left parietal scalp  The differential includes but is not limited to concussion, ICH, TBI, skull fracture, spinal fracture/dislocation, blunt thoracic trauma, hemothorax, pneumothorax, rib fractures, blunt abdominal trauma, hemorrhage, extremity fracture, dislocation.  Independent historian: None  External data reviewed: No pertinent external data  Labs: Ordered, Independent interpretation, and Details: Serum pregnancy  negative  Radiology: Ordered, Independent interpretation, Details: Patient reviewed CT of the head, do not appreciate ICH, displaced fracture, MLS, mass lesion, loss of gray/white matter differentiation, and All images reviewed independently.  Agree with radiology report at this time.   CT Angio Chest PE W and/or Wo Contrast Result Date: 02/09/2024 CLINICAL DATA:  High probability for PE. EXAM: CT ANGIOGRAPHY CHEST WITH CONTRAST TECHNIQUE: Multidetector CT imaging of the chest was performed using the standard protocol during bolus administration of intravenous contrast. Multiplanar CT image reconstructions and MIPs were obtained to evaluate the vascular anatomy. RADIATION DOSE REDUCTION: This exam was performed according to the departmental dose-optimization program which includes automated exposure control, adjustment of the mA and/or kV according to patient size and/or use of iterative reconstruction technique. CONTRAST:  75mL OMNIPAQUE  IOHEXOL  350 MG/ML SOLN COMPARISON:  CT angiogram chest 05/24/2022. FINDINGS: Cardiovascular: Satisfactory opacification of the pulmonary arteries to the segmental level. No evidence of pulmonary embolism. Normal heart size. No pericardial effusion. Mediastinum/Nodes: No enlarged mediastinal, hilar, or axillary lymph nodes. Thyroid  gland, trachea, and esophagus demonstrate no significant findings. Lungs/Pleura: There some atelectasis in the inferior upper lobes bilaterally. There some faint ground-glass opacities in the bilateral upper lobes and bilateral lower lobes. The lungs are otherwise clear. There is no pleural effusion or pneumothorax. Upper Abdomen: No acute abnormality.  There is right renal scarring. Musculoskeletal: No chest wall abnormality. No acute or significant osseous findings. Review of the MIP images confirms the above findings. IMPRESSION: 1. No evidence for pulmonary embolism. 2. Faint ground-glass opacities in the bilateral upper lobes and bilateral lower  lobes, likely infectious/inflammatory. Electronically Signed   By: Greig Pique M.D.   On: 02/09/2024 18:32   EKG/Medicine tests: Not indicated EKG Interpretation:                  Interventions: Tylenol , Decadron , Compazine , Toradol , LR bolus  See the EMR for full details regarding lab and imaging results.  Currently, patient is awake, alert, and protecting own airway and is hemodynamically stable.  Patient without focal neurologic deficits, is negative for the [place] CT head rule, therefore do not feel that she requires imaging of her C-spine.  Given patient without other focal pain or tenderness, do not feel that patient requires imaging  of the rest of her body.  Given time course since fall without any focal neurologic deficits, and reassuring exam, discussed risks and benefits of CT head, and after shared decision making, patient would prefer CT head, which I believe is reasonable, therefore this was ordered.  Will treat headache with multimodal migraine cocktail.  Imaging reveals no acute intracranial abnormality, on reevaluation after medications, patient with significant improvement of headache.  Discussed likely diagnosis of concussion, discussed relative return to activities, danger of second hit syndrome, need for supportive care and close follow-up with PCP to guide return to activity.  Patient expressed understanding, discharged in stable condition.  Presentation is most consistent with acute complicated illness and I did consider and rule out acute life/limb-threatening illness  Discussion of management or test interpretations with external provider(s): Not indicated  Risk Drugs:Prescription drug management  Disposition: DISCHARGE: I believe that the patient is safe for discharge home with outpatient follow-up. Patient was informed of all pertinent physical exam, laboratory, and imaging findings. Patient's suspected etiology of their symptom presentation was discussed with the  patient and all questions were answered. We discussed following up with PCP. I provided thorough ED return precautions. The patient feels safe and comfortable with this plan.  MDM generated using voice dictation software and may contain dictation errors.  Please contact me for any clarification or with any questions.   Clinical Impression:  1. Concussion with loss of consciousness of 30 minutes or less, initial encounter      Discharge   Final Clinical Impression(s) / ED Diagnoses Final diagnoses:  Concussion with loss of consciousness of 30 minutes or less, initial encounter    Rx / DC Orders ED Discharge Orders          Ordered    ondansetron  (ZOFRAN ) 4 MG tablet  Every 8 hours PRN        02/15/24 1106    meclizine  (ANTIVERT ) 12.5 MG tablet  3 times daily PRN        02/15/24 1106             Rogelia Jerilynn RAMAN, MD 02/18/24 [phone]

## 2024-02-15 NOTE — ED Triage Notes (Signed)
 Pt caox4, ambulatory c/o L side head pain reporting Thursday she slipped in the shower and hit the L side of her head on bathtub, +LOC. Pt states the pain has been constant and gotten worse since and that she has had a hard time staying awake during the day since.

## 2024-02-15 NOTE — ED Notes (Signed)
 DC paperwork given and verbally understood.

## 2024-02-18 ENCOUNTER — Other Ambulatory Visit: Payer: Self-pay | Admitting: Internal Medicine

## 2024-02-18 ENCOUNTER — Other Ambulatory Visit: Payer: Self-pay

## 2024-02-18 ENCOUNTER — Encounter (INDEPENDENT_AMBULATORY_CARE_PROVIDER_SITE_OTHER): Payer: Self-pay

## 2024-02-18 ENCOUNTER — Other Ambulatory Visit: Payer: Self-pay | Admitting: Pharmacy Technician

## 2024-02-18 DIAGNOSIS — J4489 Other specified chronic obstructive pulmonary disease: Secondary | ICD-10-CM

## 2024-02-18 MED ORDER — DUPIXENT 300 MG/2ML ~~LOC~~ SOAJ
300.0000 mg | SUBCUTANEOUS | 2 refills | Status: AC
  Filled 2024-02-18 – 2024-03-24 (×2): qty 4, 28d supply, fill #0
  Filled 2024-04-19: qty 4, 28d supply, fill #1
  Filled 2024-05-17 – 2024-06-21 (×2): qty 4, 28d supply, fill #2

## 2024-02-18 NOTE — Telephone Encounter (Signed)
 Refill sent for DUPIXENT  to [facility]: [phone]   Dose: 300mg  [place] every 14 days   Last OV: 01/20/24 Provider: Dr. Meade  Next OV: due around 04/20/24 with Dr. Meade or APP, not yet scheduled  Routing to scheduling team for follow-up on appt scheduling  [name], PharmD, BCPS Clinical Pharmacist  [facility]

## 2024-02-18 NOTE — Telephone Encounter (Signed)
 Attempted to call patient. Left voicemail for patient to call our office back to get scheduled.

## 2024-02-18 NOTE — Telephone Encounter (Signed)
 Pt requesting refill of specialty medication - routing to Rx team to advise.

## 2024-02-18 NOTE — Progress Notes (Signed)
 Specialty Pharmacy Refill Coordination Note  [name] is a 43 y.o. female contacted today regarding refills of specialty medication(s) Dupilumab  (Dupixent )   Patient requested (Patient-Rptd) Pickup at [facility] date: (Patient-Rptd) 03/02/24   Medication will be filled on 03/01/24.

## 2024-02-19 NOTE — Telephone Encounter (Signed)
 Any advice?

## 2024-02-25 ENCOUNTER — Ambulatory Visit: Admitting: Physical Therapy

## 2024-02-27 NOTE — Telephone Encounter (Signed)
 Patient is scheduled on 03/01/2024 with Dr. Meade for an acute visit.

## 2024-02-28 ENCOUNTER — Telehealth: Admitting: Family Medicine

## 2024-02-28 DIAGNOSIS — J441 Chronic obstructive pulmonary disease with (acute) exacerbation: Secondary | ICD-10-CM | POA: Diagnosis not present

## 2024-02-28 MED ORDER — ALBUTEROL SULFATE HFA 108 (90 BASE) MCG/ACT IN AERS: 2.0000 | INHALATION_SPRAY | Freq: Four times a day (QID) | RESPIRATORY_TRACT | 0 refills | Status: AC | PRN

## 2024-02-28 MED ORDER — BENZONATATE 100 MG PO CAPS: 100.0000 mg | ORAL_CAPSULE | Freq: Three times a day (TID) | ORAL | 0 refills | Status: AC | PRN

## 2024-02-28 MED ORDER — PREDNISONE 20 MG PO TABS: 20.0000 mg | ORAL_TABLET | Freq: Two times a day (BID) | ORAL | 0 refills | Status: DC

## 2024-02-28 NOTE — Progress Notes (Signed)
 E Visit for Asthma  Based on what you have shared with me, it looks like you may have a flare up of your asthma.  Asthma is a chronic (ongoing) lung disease which results in airway obstruction, inflammation and hyper-responsiveness.   Asthma symptoms vary from person to person, with common symptoms including nighttime awakening and decreased ability to participate in normal activities as a result of shortness of breath. It is often triggered by changes in weather, changes in the season, changes in air temperature, or inside (home, school, daycare or work) allergens such as animal dander, mold, mildew, woodstoves or cockroaches.   It can also be triggered by hormonal changes, extreme emotion, physical exertion or an upper respiratory tract illness.     It is important to identify the trigger, and then eliminate or avoid the trigger if possible.   If you have been prescribed medications to be taken on a regular basis, it is important to follow the asthma action plan and to follow guidelines to adjust medication in response to increasing symptoms of decreased peak expiratory flow rate  Treatment: I have prescribed: Albuterol  (Proventil  HFA; Ventolin  HFA) 108 (90 Base) MCG/ACT Inhaler 2 puffs into the lungs every six hours as needed for wheezing or shortness of breath  I have also sent prednisone  and tessalon  for wheezing and cough.   HOME CARE Only take medications as instructed by your medical team. Consider wearing a mask or scarf to improve breathing air temperature have been shown to decrease irritation and decrease exacerbations Get rest. Taking a steamy shower or using a humidifier may help nasal congestion sand ease sore throat pain. You can place a towel over your head and breathe in the steam from hot water coming from a faucet. Using a saline nasal spray works much the same way.   Cough drops, hare candies and sore throat lozenges may ease your cough.  Avoid close contacts especially the very you and the elderly Cover your mouth if you cough or sneeze Always remember to wash your hands.    GET HELP RIGHT AWAY IF: You develop worsening symptoms; breathlessness at rest, drowsy, confused or agitated, unable to speak in full sentences You have coughing fits You develop a severe headache or visual changes You develop shortness of breath, difficulty breathing or start having chest pain Your symptoms persist after you have completed your treatment plan If your symptoms do not improve within 10 days  MAKE SURE YOU Understand these instructions. Will watch your condition. Will get help right away if you are not doing well or get worse.   Your e-visit answers were reviewed by a board certified advanced clinical practitioner to complete your personal care plan, Depending upon the condition, your plan could have included both over the counter or prescription medications.  Please review your pharmacy choice. Your safety is important to us . If you have drug allergies check your prescription carefully. You can use MyChart to ask questions about today's visit, request a non-urgent call back, or ask for a work or school excuse for 24 hours related to this e-Visit. If it has been greater than 24 hours you will need to follow up with your provider, or enter a new e-Visit to address those concerns.  You will get an e-mail in the next two days asking about your experience. I hope that your e-visit has been valuable and will speed your recovery. Thank you for using e-visits.  I have spent 5 minutes in review of e-visit questionnaire,  review and updating patient chart, medical decision making and response to patient.   Amparo Donalson, FNP

## 2024-03-01 ENCOUNTER — Other Ambulatory Visit: Payer: Self-pay

## 2024-03-01 ENCOUNTER — Ambulatory Visit: Admitting: Internal Medicine

## 2024-03-01 DIAGNOSIS — J4489 Other specified chronic obstructive pulmonary disease: Secondary | ICD-10-CM

## 2024-03-03 ENCOUNTER — Encounter: Payer: Self-pay | Admitting: Pulmonary Disease

## 2024-03-03 ENCOUNTER — Ambulatory Visit (INDEPENDENT_AMBULATORY_CARE_PROVIDER_SITE_OTHER): Admitting: Pulmonary Disease

## 2024-03-03 VITALS — BP 143/90 | HR 111 | Ht 64.0 in | Wt 214.0 lb

## 2024-03-03 DIAGNOSIS — J398 Other specified diseases of upper respiratory tract: Secondary | ICD-10-CM

## 2024-03-03 DIAGNOSIS — J4551 Severe persistent asthma with (acute) exacerbation: Secondary | ICD-10-CM | POA: Diagnosis not present

## 2024-03-03 MED ORDER — IPRATROPIUM-ALBUTEROL 0.5-2.5 (3) MG/3ML IN SOLN
3.0000 mL | Freq: Once | RESPIRATORY_TRACT | Status: AC
  Administered 2024-03-03: 3 mL via RESPIRATORY_TRACT

## 2024-03-03 MED ORDER — METHYLPREDNISOLONE ACETATE 80 MG/ML IJ SUSP
80.0000 mg | Freq: Once | INTRAMUSCULAR | Status: AC
  Administered 2024-03-03: 80 mg via INTRAMUSCULAR

## 2024-03-03 MED ORDER — PREDNISONE 10 MG PO TABS: ORAL_TABLET | ORAL | 0 refills | Status: AC

## 2024-03-03 NOTE — Progress Notes (Signed)
 @Patient  ID: [name], female    DOB: 08-23-1980, 43 y.o.   MRN: [id number]  Chief Complaint  Patient presents with   Medical Management of Chronic Issues    Acute - SOB x 6 weeks Rx prednisone  x 6 weeks not helping     Referring provider: Suanne Pfeiffer, NP  HPI:   43 y.o. 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  Multiple prior pulmonary notes reviewed.  Several weeks of cough wheeze shortness of breath.  Despite being on multiple courses of prednisone .  Usually 20 mg.  She is tight, wheezing.  We discussed concern given she is already on Dupixent  and having this significant for breakthrough.  We discussed higher doses of steroids to see if this would finally nip it in the bud.  Questionaires / Pulmonary Flowsheets:   ACT:  Asthma Control Test ACT Total Score  08/19/2023 10:40 AM 12  03/27/2021  3:53 PM 12  07/15/2019  4:04 PM 11    MMRC:     No data to display          Epworth:      No data to display          Tests:   FENO:  Lab Results  Component Value Date   NITRICOXIDE 7 10/18/2015    PFT:    Latest Ref Rng & Units 11/16/2015   12:47 PM  PFT Results  FVC-Pre L 3.54   FVC-Predicted Pre % 94   Pre FEV1/FVC % % 55   FEV1-Pre L 1.96   FEV1-Predicted Pre % 62   DLCO uncorrected ml/min/mmHg 23.29   DLCO UNC% % 95   DLVA Predicted % 97   TLC L 8.11   TLC % Predicted % 160   RV % Predicted % 292   Personally viewed interpreted as moderate fixed obstruction, lung volumes with hyperinflation and air trapping, DLCO within normal limits  WALK:      No data to display          Imaging: Personally reviewed CT Head Wo Contrast Result Date: 02/15/2024 CLINICAL DATA:  43 year old female with fall on 02/12/2024 striking left parietal region. Increasing headache. EXAM: CT HEAD  WITHOUT CONTRAST TECHNIQUE: Contiguous axial images were obtained from the base of the skull through the vertex without intravenous contrast. RADIATION DOSE REDUCTION: This exam was performed according to the departmental dose-optimization program which includes automated exposure control, adjustment of the mA and/or kV according to patient size and/or use of iterative reconstruction technique. COMPARISON:  Brain MRI 01/30/2015.  Head CT 03/21/2018. FINDINGS: Brain: Stable cerebral volume. No midline shift, ventriculomegaly, mass effect, evidence of mass lesion, intracranial hemorrhage or evidence of cortically based acute infarction. Gray-white matter differentiation is within normal limits throughout the brain. Vascular: No suspicious intracranial vascular hyperdensity. Skull: Scaphocephaly, normal variant. No acute osseous abnormality identified. Sinuses/Orbits: Visualized paranasal sinuses and mastoids are clear. Other: Mild left posterosuperior convexity scalp soft tissue swelling compatible with hematoma or contusion series 3, image 62. Underlying calvarium appears intact. Otherwise negative visible orbit and scalp soft tissues. IMPRESSION: 1. Mild left superior scalp soft tissue injury.  No skull fracture. 2. Normal for age noncontrast CT appearance of the brain. Electronically Signed   By: VEAR Hurst M.D.   On: 02/15/2024 10:55   CT Angio Chest PE W and/or Wo Contrast Result Date: 02/09/2024 CLINICAL  DATA:  High probability for PE. EXAM: CT ANGIOGRAPHY CHEST WITH CONTRAST TECHNIQUE: Multidetector CT imaging of the chest was performed using the standard protocol during bolus administration of intravenous contrast. Multiplanar CT image reconstructions and MIPs were obtained to evaluate the vascular anatomy. RADIATION DOSE REDUCTION: This exam was performed according to the departmental dose-optimization program which includes automated exposure control, adjustment of the mA and/or kV according to patient size  and/or use of iterative reconstruction technique. CONTRAST:  75mL OMNIPAQUE  IOHEXOL  350 MG/ML SOLN COMPARISON:  CT angiogram chest 05/24/2022. FINDINGS: Cardiovascular: Satisfactory opacification of the pulmonary arteries to the segmental level. No evidence of pulmonary embolism. Normal heart size. No pericardial effusion. Mediastinum/Nodes: No enlarged mediastinal, hilar, or axillary lymph nodes. Thyroid  gland, trachea, and esophagus demonstrate no significant findings. Lungs/Pleura: There some atelectasis in the inferior upper lobes bilaterally. There some faint ground-glass opacities in the bilateral upper lobes and bilateral lower lobes. The lungs are otherwise clear. There is no pleural effusion or pneumothorax. Upper Abdomen: No acute abnormality.  There is right renal scarring. Musculoskeletal: No chest wall abnormality. No acute or significant osseous findings. Review of the MIP images confirms the above findings. IMPRESSION: 1. No evidence for pulmonary embolism. 2. Faint ground-glass opacities in the bilateral upper lobes and bilateral lower lobes, likely infectious/inflammatory. Electronically Signed   By: Greig Pique M.D.   On: 02/09/2024 18:32   DG Chest 2 View Result Date: 02/09/2024 EXAM: 2 VIEW(S) XRAY OF THE CHEST 02/09/2024 04:14:00 PM COMPARISON: 01/07/2024 CLINICAL HISTORY: Shortness of Breath, Chest Pain. Pt states 3 weeks coughing and wheezing. FINDINGS: LUNGS AND PLEURA: No focal pulmonary opacity. No pulmonary edema. No pleural effusion. No pneumothorax. HEART AND MEDIASTINUM: No acute abnormality of the cardiac and mediastinal silhouettes. BONES AND SOFT TISSUES: No acute osseous abnormality. IMPRESSION: 1. No acute cardiopulmonary process. Electronically signed by: Waddell Calk MD 02/09/2024 04:47 PM EDT RP Workstation: [id number]    Lab Results: Personally reviewed CBC    Component Value Date/Time   WBC 13.6 (H) 02/09/2024 1521   RBC 4.63 02/09/2024 1521   HGB 13.5  02/09/2024 1521   HGB 16.1 (H) 08/19/2019 1426   HCT 43.2 02/09/2024 1521   HCT 48.6 (H) 08/19/2019 1426   PLT 374 02/09/2024 1521   PLT 347 08/19/2019 1426   MCV 93.3 02/09/2024 1521   MCV 90 08/19/2019 1426   MCH 29.2 02/09/2024 1521   MCHC 31.3 02/09/2024 1521   RDW 14.5 02/09/2024 1521   RDW 13.3 08/19/2019 1426   LYMPHSABS 1.3 03/26/2023 0844   MONOABS 0.7 03/26/2023 0844   EOSABS 0.4 03/26/2023 0844   BASOSABS 0.1 03/26/2023 0844    BMET    Component Value Date/Time   NA 137 02/09/2024 1521   NA 138 03/13/2020 1115   K 3.9 02/09/2024 1521   CL 101 02/09/2024 1521   CO2 20 (L) 02/09/2024 1521   GLUCOSE 161 (H) 02/09/2024 1521   BUN 12 02/09/2024 1521   BUN 9 03/13/2020 1115   CREATININE 1.25 (H) 02/09/2024 1521   CREATININE 1.09 10/31/2015 1506   CALCIUM  9.2 02/09/2024 1521   GFRNONAA 55 (L) 02/09/2024 1521   GFRNONAA 67 10/31/2015 1506   GFRAA 96 03/13/2020 1115   GFRAA 78 10/31/2015 1506    BNP No results found for: BNP  ProBNP No results found for: PROBNP  Specialty Problems       Pulmonary Problems   Asthma   Severe persistent asthma with acute exacerbation (HCC)   Asthma exacerbation  in COPD (HCC)   Acute respiratory failure with hypoxia (HCC)   Allergic rhinitis    Allergies  Allergen Reactions   Alprazolam Itching   Oxycodone  Itching   Penicillins Nausea And Vomiting and Other (See Comments)    Has patient had a PCN reaction causing immediate rash, facial/tongue/throat swelling, SOB or lightheadedness with hypotension:No Has patient had a PCN reaction causing severe rash involving mucus membranes or skin necrosis:No Has patient had a PCN reaction that REACTION THAT REQUIRED HOSPITALIZATION:  #  #  #  YES  #  #  #  Has patient had a PCN reaction occurring within the last 10 years: #  #  #  YES  #  #  #    Norvasc  [Amlodipine  Besylate] Other (See Comments)    Tip of tongue became numb and caused flushing    Immunization History   Administered Date(s) Administered   DT (Pediatric) 08/19/1984, 07/29/1985, 07/24/1987   DTP 03/28/1983   Hep B, Unspecified 02/26/1995, 04/03/1995, 09/03/1995   Influenza Inj Mdck Quad Pf 04/04/2021, 02/19/2022   Influenza, Mdck, Trivalent,PF 6+ MOS(egg free) 04/30/2023   Influenza,inj,Quad PF,6+ Mos 07/29/2014, 06/07/2015, 07/21/2017, 02/05/2018, 03/03/2019, 05/25/2022   Influenza-Unspecified 03/24/2012, 07/29/2014, 06/07/2015, 07/21/2017, 02/05/2018, 03/03/2019   MMR 06/19/1987   OPV 08/19/1984, 07/29/1985, 06/19/1987   PNEUMOCOCCAL CONJUGATE-20 01/04/2023   Pneumococcal Polysaccharide-23 07/29/2014, 06/08/2015   Pneumococcal-Unspecified 07/29/2014, 06/08/2015   Tdap 11/20/2011, 06/07/2015    Past Medical History:  Diagnosis Date   Anemia    Anxiety    Arthritis    Asthma    Complication of anesthesia    during foot surgery in 2023 went into repiratory distress   COPD (chronic obstructive pulmonary disease) (HCC)    Depression    doing good, not on meds now   GERD (gastroesophageal reflux disease)    Headache    migraines   History of kidney stones    Hypertension    Kidney stones    Missed abortion 10/02/2013   Ovarian cyst    Pneumonia    Pregnancy induced hypertension    Suicidal intent    Vaginal Pap smear, abnormal    bx, f/u ok    Tobacco History: Social History   Tobacco Use  Smoking Status Every Day   Current packs/day: 1.00   Average packs/day: 1 pack/day for 15.0 years (15.0 ttl pk-yrs)   Types: Cigarettes  Smokeless Tobacco Never  Tobacco Comments   Quit smoking 10/06/23    Ready to quit: Not Answered Counseling given: Not Answered Tobacco comments: Quit smoking 10/06/23    Continue to not smoke  Outpatient Encounter Medications as of 03/03/2024  Medication Sig   ADVAIR  HFA 230-21 MCG/ACT inhaler Inhale 2 puffs into the lungs 2 (two) times daily. **brand per insurance**   albuterol  (PROVENTIL ) (2.5 MG/3ML) 0.083% nebulizer solution Take 3  mLs (2.5 mg total) by nebulization every 6 (six) hours as needed for wheezing or shortness of breath.   albuterol  (VENTOLIN  HFA) 108 (90 Base) MCG/ACT inhaler Inhale 2 puffs into the lungs every 6 (six) hours as needed for wheezing or shortness of breath.   albuterol  (VENTOLIN  HFA) 108 (90 Base) MCG/ACT inhaler Inhale 2 puffs into the lungs every 6 (six) hours as needed for wheezing or shortness of breath.   Azelastine -Fluticasone  137-50 MCG/ACT SUSP Place 1 spray into the nose in the morning and at bedtime.   benzonatate  (TESSALON ) 100 MG capsule Take 1 capsule (100 mg total) by mouth 3 (three) times daily as  needed for up to 10 days for cough.   budesonide  (PULMICORT ) 0.25 MG/2ML nebulizer solution INHALE 1 VIAL VIA NEBULIZER IN THE MORNING AND AT BEDTIME   busPIRone  (BUSPAR ) 10 MG tablet Take 10 mg by mouth 3 (three) times daily.   ciprofloxacin  (CIPRO ) 250 MG tablet Take 1 tablet (250 mg total) by mouth 2 (two) times daily.   clonazePAM  (KLONOPIN ) 0.5 MG tablet Take 0.5 mg by mouth 2 (two) times daily as needed.   doxycycline  (VIBRAMYCIN ) 100 MG capsule Take 1 capsule (100 mg total) by mouth 2 (two) times daily.   Dupilumab  (DUPIXENT ) 300 MG/2ML SOAJ Inject 300 mg into the skin every 14 (fourteen) days.   folic acid  (FOLVITE ) 1 MG tablet Take 1 mg by mouth daily.   gabapentin  (NEURONTIN ) 300 MG capsule Take 300 mg by mouth 3 (three) times daily.   hydrOXYzine  (VISTARIL ) 25 MG capsule Take 1 capsule (25 mg total) by mouth 3 (three) times daily as needed for anxiety.   levocetirizine (XYZAL ) 5 MG tablet Take 1 tablet (5 mg total) by mouth every evening.   losartan  (COZAAR ) 50 MG tablet Take 50 mg by mouth daily.   meclizine  (ANTIVERT ) 12.5 MG tablet Take 1 tablet (12.5 mg total) by mouth 3 (three) times daily as needed for dizziness.   montelukast  (SINGULAIR ) 10 MG tablet Take 1 tablet (10 mg total) by mouth daily.   Nebulizers (COMPRESSOR/NEBULIZER) MISC Use as directed   ondansetron  (ZOFRAN ) 4  MG tablet Take 1 tablet (4 mg total) by mouth every 8 (eight) hours as needed for nausea or vomiting.   ondansetron  (ZOFRAN -ODT) 4 MG disintegrating tablet Take 4 mg by mouth daily as needed.   OXTELLAR XR 300 MG TB24 Take 1 tablet by mouth daily.   pantoprazole  (PROTONIX ) 40 MG tablet Take 1 tablet (40 mg total) by mouth daily.   pantoprazole  (PROTONIX ) 40 MG tablet Take 1 tablet (40 mg total) by mouth daily.   predniSONE  (DELTASONE ) 10 MG tablet Take 4 tablets (40 mg total) by mouth daily with breakfast for 7 days, THEN 2 tablets (20 mg total) daily with breakfast for 7 days, THEN 1 tablet (10 mg total) daily with breakfast for 7 days.   REXULTI 1 MG TABS tablet Take 1 mg by mouth daily.   SYMBICORT  160-4.5 MCG/ACT inhaler Inhale 2 puffs into the lungs in the morning and at bedtime.   Tiotropium Bromide  Monohydrate (SPIRIVA  RESPIMAT) 1.25 MCG/ACT AERS Inhale 2 puffs into the lungs daily.   tiZANidine (ZANAFLEX) 4 MG tablet Take 4 mg by mouth every 8 (eight) hours as needed for muscle spasms.   traMADol  (ULTRAM ) 50 MG tablet Take 1 tablet (50 mg total) by mouth every 6 (six) hours as needed.   TRINTELLIX 10 MG TABS tablet Take 10 mg by mouth daily.   WEGOVY 0.5 MG/0.5ML SOAJ Inject 0.5 mg into the skin once a week.   [DISCONTINUED] predniSONE  (DELTASONE ) 20 MG tablet Take 1 tablet (20 mg total) by mouth 2 (two) times daily with a meal for 5 days.   [EXPIRED] ipratropium-albuterol  (DUONEB) 0.5-2.5 (3) MG/3ML nebulizer solution 3 mL    [EXPIRED] methylPREDNISolone  acetate (DEPO-MEDROL ) injection 80 mg    No facility-administered encounter medications on file as of 03/03/2024.     Review of Systems  Review of Systems  N/a Physical Exam  BP (!) 143/90   Pulse (!) 111   Ht 5' 4 (1.626 m)   Wt 214 lb (97.1 kg)   LMP 02/09/2024 (Approximate)  SpO2 95%   BMI 36.73 kg/m   Wt Readings from Last 5 Encounters:  03/03/24 214 lb (97.1 kg)  02/15/24 210 lb (95.3 kg)  01/20/24 211 lb 6.4 oz  (95.9 kg)  01/07/24 203 lb (92.1 kg)  12/23/23 204 lb (92.5 kg)    BMI Readings from Last 5 Encounters:  03/03/24 36.73 kg/m  02/15/24 36.05 kg/m  01/20/24 36.29 kg/m  01/07/24 34.84 kg/m  12/23/23 36.14 kg/m     Physical Exam General: Sitting in chair, no acute distress Eyes: EOMI, no icterus Neck: Supple, no JVP Pulmonary: Tight, decreased air excursion, wheezy throughout Cardiovascular: Regular rate and rhythm Abdomen: Nondistended MSK: No synovitis, no joint effusion Neuro: Normal gait, no weakness Psych: Normal mood, full affect  Assessment & Plan:   Severe persistent asthma with eosinophilic features with acute to subacute ongoing exacerbation.  Prolonged prednisone  taper with higher dose of started at 40 mg.  Depomedrol shot.  DuoNeb.  Breztri  ordered, not on Medicaid formulary but seems this may have been more helpful in the past.  Consider CBC with differential to assess for developing eosinophilia which can be seen in Dupixent  which may be causing uncontrolled symptoms.  Persistent wheezing: Presumed related to asthma.  CT high-resolution in-store expiratory film to assess for possible tracheobronchomalacia which can mimic severe asthma given lack of improvement despite triple inhaled therapy and biologic therapy.  Notably, no significant tracheobronchomalacia was seen on recent CTA PE protocol.   Return in about 4 weeks (around 03/31/2024) for with APP or Dr. Meade.   Donnice JONELLE Beals, MD 03/03/2024

## 2024-03-03 NOTE — Patient Instructions (Addendum)
 Nice to meet you  You are very wheezy and tight  We will do a DuoNeb, breathing treatment here in the office  Will give a shot of steroids here in the office  Take prednisone  as prescribed  Use Breztri  2 puffs twice a day every day, rinse her mouth out with water after use.  This will replace the Advair  and Spiriva .  You to do both.  Continue all your other nebulized medicines.  If the Breztri  is more beneficial or more helpful than previous Advair  and Spiriva , let me know we can prescribe it.  Lastly, I ordered a high-res CT scan of the chest to see if there are floppy air tubes, this can mimic asthma and cause persistent wheeze despite all the steroids, inhalers, and Dupixent   Return to clinic in 4 weeks with APP or Dr. Meade

## 2024-03-04 ENCOUNTER — Encounter: Payer: Self-pay | Admitting: Internal Medicine

## 2024-03-05 MED ORDER — PANTOPRAZOLE SODIUM 40 MG PO TBEC: 40.0000 mg | DELAYED_RELEASE_TABLET | Freq: Every day | ORAL | 1 refills | Status: DC

## 2024-03-08 ENCOUNTER — Ambulatory Visit
Admission: RE | Admit: 2024-03-08 | Discharge: 2024-03-08 | Disposition: A | Discharge status: Home / Self Care | Source: Ambulatory Visit | Attending: Pulmonary Disease | Admitting: Pulmonary Disease

## 2024-03-08 ENCOUNTER — Encounter: Payer: Self-pay | Admitting: Pulmonary Disease

## 2024-03-08 DIAGNOSIS — J398 Other specified diseases of upper respiratory tract: Secondary | ICD-10-CM

## 2024-03-08 NOTE — Telephone Encounter (Signed)
 Copied from CRM #[id number]. Topic: Clinical - Prescription Issue >> Mar 08, 2024  9:11 AM [name] wrote: Reason for CRM: Patient is having issues with [facility] preferred [phone] - [place], [place] - [address] AT [address] [facility] [address] filling her prescription for pantoprazole  (PROTONIX ) 40 MG tablet due to the prescription only being for 1x a day. Pt stated Dr. Meade had changed it to 2x a day.   Please assist with correcting prescription today if possible per the pt's request. Pt's phone number is [phone] ok to leave a vm.  Called and spoke with the pt. Pt states her pharmacy will not let her fill prescription due to it being old.  I called the pharmacy, they have the recent rx sent on 10/17. They are unable to fill it due to insurance and her current prescription is filled.  Pt will have to wait till 10/21 before she can fill rx.  Pt is aware of this and will contact pharmacy. Nfn

## 2024-03-09 ENCOUNTER — Other Ambulatory Visit: Payer: Self-pay

## 2024-03-10 ENCOUNTER — Ambulatory Visit: Payer: Self-pay | Admitting: Pulmonary Disease

## 2024-03-12 ENCOUNTER — Other Ambulatory Visit (HOSPITAL_COMMUNITY): Payer: Self-pay

## 2024-03-12 NOTE — Progress Notes (Signed)
 Patient called [facility]. Patient will call back on Monday 10.27. Patient wants delivery but doesn't have address yet. Confirmed address is in [place]. May have to place on AR account.

## 2024-03-16 ENCOUNTER — Other Ambulatory Visit: Payer: Self-pay

## 2024-03-17 ENCOUNTER — Other Ambulatory Visit: Payer: Self-pay

## 2024-03-17 ENCOUNTER — Other Ambulatory Visit (HOSPITAL_COMMUNITY): Payer: Self-pay

## 2024-03-17 NOTE — Progress Notes (Signed)
 Returning to stock, patient never picked up medication or followed up with pharmacy.

## 2024-03-24 ENCOUNTER — Other Ambulatory Visit: Payer: Self-pay

## 2024-03-24 ENCOUNTER — Other Ambulatory Visit (HOSPITAL_COMMUNITY): Payer: Self-pay

## 2024-03-26 ENCOUNTER — Other Ambulatory Visit: Payer: Self-pay

## 2024-03-26 NOTE — Progress Notes (Signed)
 Specialty Pharmacy Refill Coordination Note  [name] is a 43 y.o. female contacted today regarding refills of specialty medication(s) Dupilumab  (Dupixent )   Patient requested Delivery   Delivery date: 03/30/24   [address] [address] [place] [address]   Medication will be filled on: 03/29/24

## 2024-03-29 ENCOUNTER — Other Ambulatory Visit: Payer: Self-pay

## 2024-04-01 ENCOUNTER — Other Ambulatory Visit: Payer: Self-pay

## 2024-04-01 ENCOUNTER — Encounter: Payer: Self-pay | Admitting: Pulmonary Disease

## 2024-04-01 ENCOUNTER — Telehealth: Payer: Self-pay

## 2024-04-01 ENCOUNTER — Other Ambulatory Visit: Payer: Self-pay | Admitting: Pulmonary Disease

## 2024-04-01 DIAGNOSIS — J4551 Severe persistent asthma with (acute) exacerbation: Secondary | ICD-10-CM

## 2024-04-01 MED ORDER — BREZTRI AEROSPHERE 160-9-4.8 MCG/ACT IN AERO: 2.0000 | INHALATION_SPRAY | Freq: Two times a day (BID) | RESPIRATORY_TRACT | 2 refills | Status: DC

## 2024-04-01 NOTE — Telephone Encounter (Signed)
 Ok for samples. Please send prescription as well. Please send referral to Pulmonology, outside referral to Tyrus Louder, MD, [address], [place], phone [phone] - [facility].

## 2024-04-01 NOTE — Telephone Encounter (Signed)
 Copied from CRM [id number]. Topic: Clinical - Medication Question >> Mar 31, 2024 11:09 AM Leila BROCKS wrote: Reason for CRM: Patient [phone] states is in the area and wants to stop in to pick up samples of Breztri  inhaler? Patient is out of samples. Patient states Breztri  is helpful, and if the office does not have samples, can Breztri  inhaler be sent to the pharmacy? Patient is unsure if insurance will pay for it. Patient wants to a referral for a pulmonologist near where patient live? Please advise and call back.   [id number] [place], [place] - [address] OF [address] [place] [address] Phone: [phone] Fax: [phone]    Spoke with patient Rx sent to pharmacy , referral sent    -NFN

## 2024-04-02 ENCOUNTER — Encounter: Payer: Self-pay | Admitting: Pulmonary Disease

## 2024-04-05 ENCOUNTER — Encounter: Payer: Self-pay | Admitting: Pulmonary Disease

## 2024-04-05 ENCOUNTER — Ambulatory Visit: Payer: Self-pay | Admitting: Pulmonary Disease

## 2024-04-05 ENCOUNTER — Telehealth: Payer: Self-pay

## 2024-04-05 DIAGNOSIS — J4551 Severe persistent asthma with (acute) exacerbation: Secondary | ICD-10-CM

## 2024-04-05 NOTE — Telephone Encounter (Signed)
 I sent a referral from Dr Lorene on 11/13.  I see there's another one for today also.

## 2024-04-05 NOTE — Telephone Encounter (Signed)
 Non-spec pa's

## 2024-04-05 NOTE — Telephone Encounter (Signed)
 FYI Only or Action Required?: Action required by provider: request for appointment. Also needs meds and a better nebulizer  Patient is followed in Pulmonology for , last seen on 03/03/2024 by Hunsucker, Donnice SAUNDERS, MD.  Called Nurse Triage reporting Wheezing.  Symptoms began 11 months.  Interventions attempted: Other: Inhaler, O2 , Nebulizer.  Symptoms are: gradually worsening.  Triage Disposition: See HCP Within 4 Hours (Or PCP Triage)  Patient/caregiver understands and will follow disposition?: yes - wants to be seen in [place].                  E2C2 Pulmonary Triage - Initial Assessment Questions "Chief Complaint (e.g., cough, sob, wheezing, fever, chills, sweat or additional symptoms) *Go to specific symptom protocol after initial questions. Wheezing  "How long have symptoms been present?" 11 weeks  Have you tested for COVID or Flu? Note: If not, ask patient if a home test can be taken. If so, instruct patient to call back for positive results. Yes  MEDICINES:   "Have you used any OTC meds to help with symptoms?" Yes If yes, ask "What medications?" no  "Have you used your inhalers/maintenance medication?" Yes If yes, "What medications?" Symbicort , Breztri  and nebulizer  If inhaler, ask "How many puffs and how often?" Note: Review instructions on medication in the chart. yes  OXYGEN: "Do you wear supplemental oxygen?" Yes If yes, "How many liters are you supposed to use?" 3L  "Do you monitor your oxygen levels?" Yes If yes, What is your reading (oxygen level) today? 53 this morning  What is your usual oxygen saturation reading?  (Note: Pulmonary O2 sats should be 90% or greater) 94          Copied from CRM #[id number]. Topic: Clinical - Red Word Triage >> Apr 05, 2024  4:52 PM [name] wrote: [facility] that prompted transfer to Nurse Triage: shortness of breath, wheezing Reason for Disposition  [1] MILD difficulty breathing (e.g.,  minimal/no SOB at rest, SOB with walking, pulse < 100) AND [2] NEW-onset or WORSE than normal  Answer Assessment - Initial Assessment Questions 1. RESPIRATORY STATUS: Describe your breathing? (e.g., wheezing, shortness of breath, unable to speak, severe coughing)      wheezing 2. ONSET: When did this breathing problem begin?      11 weeks 3. PATTERN Does the difficult breathing come and go, or has it been constant since it started?      constant 4. SEVERITY: How bad is your breathing? (e.g., mild, moderate, severe)      Moderate 5. RECURRENT SYMPTOM: Have you had difficulty breathing before? If Yes, ask: When was the last time? and What happened that time?      yes 6. CARDIAC HISTORY: Do you have any history of heart disease? (e.g., heart attack, angina, bypass surgery, angioplasty)      no 7. LUNG HISTORY: Do you have any history of lung disease?  (e.g., pulmonary embolus, asthma, emphysema)     copd 8. CAUSE: What do you think is causing the breathing problem?      COPD 9. OTHER SYMPTOMS: Do you have any other symptoms? (e.g., chest pain, cough, dizziness, fever, runny nose)     Runny nose 10. O2 SATURATION MONITOR:  Do you use an oxygen saturation monitor (pulse oximeter) at home? If Yes, ask: What is your reading (oxygen level) today? What is your usual oxygen saturation reading? (e.g., 95%)       95%  Protocols used: Breathing Difficulty-A-AH

## 2024-04-05 NOTE — Telephone Encounter (Signed)
 Its to establish a new pulmonologist   not pulm rehab

## 2024-04-05 NOTE — Telephone Encounter (Signed)
 Copied from CRM #[id number]. Topic: Clinical - Medication Prior Auth >> Apr 02, 2024  3:17 PM [name] wrote: Reason for CRM: Patient calling in stating she needs prior auth Breztri  and Symbacort. Please call patient @ [phone].   Please see pt's call regard the Prior Auth needed for the listed medications.

## 2024-04-05 NOTE — Telephone Encounter (Signed)
 Copied from CRM #[id number]. Topic: Referral - Question >> Apr 05, 2024  8:34 AM [name] wrote: Reason for CRM: received call from [facility] from [facility] [phone] states she received medical records for Ibarra referral from us  for pulmonary rehab and ambulatory pulmonology for Dr. Tyrus Louder, she wants to clarify which referral would be the correct one

## 2024-04-06 MED ORDER — PREDNISONE 20 MG PO TABS: ORAL_TABLET | ORAL | 0 refills | Status: AC

## 2024-04-06 NOTE — Telephone Encounter (Signed)
 ATC x2. No answer- left vm to call back.   Sending Mychart message.

## 2024-04-06 NOTE — Telephone Encounter (Signed)
 Please send prednisone  40 mg daily x 5 days then 20 mg daily x 5 days. If worsens of rails to improve by the weekend she needs to go to the ED.

## 2024-04-06 NOTE — Telephone Encounter (Signed)
 She has been changed to Breztri , she is also on pantoprazole .

## 2024-04-06 NOTE — Telephone Encounter (Signed)
 ATC X1. LMTCB. Pt's new number is [phone]

## 2024-04-06 NOTE — Telephone Encounter (Signed)
 Dr. Annella, Please advise regarding the wheezing.   Thank you.

## 2024-04-06 NOTE — Addendum Note (Signed)
 Addended byBETHA FRIES, Lucresia Simic A on: 04/06/2024 10:25 AM   Modules accepted: Orders

## 2024-04-07 ENCOUNTER — Other Ambulatory Visit: Payer: Self-pay | Admitting: Pulmonary Disease

## 2024-04-07 ENCOUNTER — Telehealth: Payer: Self-pay | Admitting: *Deleted

## 2024-04-07 ENCOUNTER — Telehealth: Payer: Self-pay

## 2024-04-07 ENCOUNTER — Other Ambulatory Visit (HOSPITAL_COMMUNITY): Payer: Self-pay

## 2024-04-07 NOTE — Telephone Encounter (Signed)
 From pharmacy team:  Insurance is also asking about Brand Advair  Diskus, pretty much requiring trial of all alternatives before coverage of Trelegy. Just making sure I didn't miss anything since I only saw the Advair  HFA

## 2024-04-07 NOTE — Telephone Encounter (Signed)
 Pharmacy Patient Advocate Encounter   Received notification from [facility] that Prior Authorization for Breztri  Aerosphere 160-9-4.8MCG/ACT aerosol   has been DENIED.  Full denial letter will be uploaded to the media tab. See denial reason below.    Patient must try preferred alternatives Brand Advair  Diksus and Dulera .    Previous notes of Symbicort  and Advair  HFA were submitted to plan.         She has also tried and failed Dulera  as well (back in 2023-2024).

## 2024-04-07 NOTE — Telephone Encounter (Signed)
*  Pulm  Pharmacy Patient Advocate Encounter   Received notification from Pt Calls Messages that prior authorization for Breztri  is required/requested.   Insurance verification completed.   The patient is insured through [facility].   Per test claim: PA required; PA submitted to above mentioned insurance via Latent Key/confirmation #/EOC [id number] Status is pending

## 2024-04-07 NOTE — Telephone Encounter (Signed)
 Dr. Desai, The patient is stating that the Pantoprazole  was supposed to be changed to twice a day a couple of months ago.  I do not see that mentioned in your last office visit note.  Please advise.  Thank you.

## 2024-04-07 NOTE — Telephone Encounter (Signed)
 Copied from CRM [id number]. Topic: Clinical - Medication Refill >> Apr 07, 2024  1:18 PM Abigail D wrote: Medication: SYMBICORT  160-4.5 MCG/ACT inhaler budesonide  (PULMICORT ) 0.25 MG/2ML nebulizer solution budesonide -glycopyrrolate -formoterol  (BREZTRI  AEROSPHERE) 160-9-4.8 MCG/ACT AERO inhaler  Has the patient contacted their pharmacy? No (Agent: If no, request that the patient contact the pharmacy for the refill. If patient does not wish to contact the pharmacy document the reason why and proceed with request.) (Agent: If yes, when and what did the pharmacy advise?)  This is the patient's preferred pharmacy:  [id number] [place] [place] - [address] [place]  [address] Phone: [phone] Fax: [phone]  Is this the correct pharmacy for this prescription? Yes If no, delete pharmacy and type the correct one.   Has the prescription been filled recently? No  Is the patient out of the medication? Yes  Has the patient been seen for an appointment in the last year OR does the patient have an upcoming appointment? Yes  Can we respond through MyChart? Yes  Agent: Please be advised that Rx refills may take up to 3 business days. We ask that you follow-up with your pharmacy.

## 2024-04-07 NOTE — Telephone Encounter (Signed)
 Dr. Meade, Please see denial/recommendations and advise.  Thank you.

## 2024-04-07 NOTE — Telephone Encounter (Signed)
 Pharmacy Patient Advocate Encounter  Received notification from [facility] that Prior Authorization for Breztri  Aerosphere 160-9-4.8MCG/ACT aerosol   has been DENIED.  Full denial letter will be uploaded to the media tab. See denial reason below.   Patient must try preferred alternatives Brand Advair  Diksus and Dulera .   Previous notes of Symbicort  and Advair  HFA were submitted to plan.

## 2024-04-09 ENCOUNTER — Telehealth: Payer: Self-pay

## 2024-04-09 DIAGNOSIS — J4489 Other specified chronic obstructive pulmonary disease: Secondary | ICD-10-CM

## 2024-04-09 MED ORDER — BREZTRI AEROSPHERE 160-9-4.8 MCG/ACT IN AERO: 2.0000 | INHALATION_SPRAY | Freq: Two times a day (BID) | RESPIRATORY_TRACT | 2 refills | Status: DC

## 2024-04-09 MED ORDER — BUDESONIDE 0.25 MG/2ML IN SUSP: 0.2500 mg | Freq: Every day | RESPIRATORY_TRACT | 8 refills | Status: AC

## 2024-04-09 NOTE — Telephone Encounter (Signed)
 Called and spoke with the pts step mom. Pt is currently in ED and has access to the pts Mychart acc and wanted to schedule HFU with Dr. Annella.  Pts mother is requesting if there is any COPD support groups or possibly pulmonary rehab as the pt is having suicidal thoughts and just needs support from others.  Dr. Annella any recommendations?

## 2024-04-09 NOTE — Telephone Encounter (Signed)
 Copied from CRM #[id number]. Topic: Clinical - Prescription Issue >> Apr 09, 2024 10:40 AM [name] wrote: Reason for CRM: Patient states Budesonide -glycopyrrolate -formoterol  (BREZTRI  AEROSPHERE) 160-9-4.8 MCG/ACT AERO inhaler [[id number]]   Was sent to the wrong pharmacy, [facility] [address] [place], [place], [place] [address] Phone: [phone]  The following number is the correct one for this pt -  [phone]  ATCx1 LVMTCB - informed pt via vm that our office will close at 12pm today, 04/09/24

## 2024-04-13 ENCOUNTER — Other Ambulatory Visit (HOSPITAL_COMMUNITY): Payer: Self-pay

## 2024-04-13 ENCOUNTER — Telehealth (HOSPITAL_COMMUNITY): Payer: Self-pay

## 2024-04-13 ENCOUNTER — Other Ambulatory Visit: Payer: Self-pay

## 2024-04-13 MED ORDER — FLUTICASONE-SALMETEROL 500-50 MCG/ACT IN AEPB
1.0000 | INHALATION_SPRAY | Freq: Two times a day (BID) | RESPIRATORY_TRACT | 5 refills | Status: DC
  Filled 2024-04-13 – 2024-04-16 (×2): qty 60, 30d supply, fill #0

## 2024-04-13 MED ORDER — PANTOPRAZOLE SODIUM 40 MG PO TBEC
40.0000 mg | DELAYED_RELEASE_TABLET | Freq: Two times a day (BID) | ORAL | 5 refills | Status: AC
  Filled 2024-04-13: qty 60, 30d supply, fill #0

## 2024-04-13 MED ORDER — PANTOPRAZOLE SODIUM 40 MG PO TBEC
40.0000 mg | DELAYED_RELEASE_TABLET | Freq: Every day | ORAL | 1 refills | Status: AC
Start: 2024-04-13 — End: ?
  Filled 2024-04-13: qty 90, 90d supply, fill #0

## 2024-04-13 NOTE — Telephone Encounter (Signed)
 Advair  diskus sent to pharmacy to try.

## 2024-04-13 NOTE — Telephone Encounter (Signed)
 Pantoprazole  sent to pharmacy for BID dosing. She needs to try advair  diskus 500 1 puff BID first before insurance will approve PA for breztri 

## 2024-04-13 NOTE — Addendum Note (Signed)
 Addended by: Jakyri Brunkhorst on: 04/13/2024 12:25 PM   Modules accepted: Orders

## 2024-04-14 ENCOUNTER — Other Ambulatory Visit (HOSPITAL_COMMUNITY): Payer: Self-pay

## 2024-04-14 NOTE — Telephone Encounter (Signed)
 Called patient to see if she was interested in Pulmonary rehab. Patient stated that she was. Advised that we would be calling her to schedule after f/u visit.

## 2024-04-14 NOTE — Telephone Encounter (Signed)
 Referral to pulmonary rehab is a good idea. Please place order diagnosis is COPD.

## 2024-04-14 NOTE — Telephone Encounter (Signed)
 Patient aware order placed.NFN

## 2024-04-16 ENCOUNTER — Telehealth (HOSPITAL_COMMUNITY): Payer: Self-pay

## 2024-04-16 ENCOUNTER — Other Ambulatory Visit (HOSPITAL_COMMUNITY): Payer: Self-pay

## 2024-04-16 NOTE — Telephone Encounter (Signed)
 Pharmacy Patient Advocate Encounter  Received notification from [facility] MEDICAID that Prior Authorization for Advair  Diskus 500-50MCG/ACT aerosol powder  has been APPROVED from 04/16/24 to 05/16/24. Ran test claim, Copay is $4. This test claim was processed through [facility]- copay amounts may vary at other pharmacies due to pharmacy/plan contracts, or as the patient moves through the different stages of their insurance plan.   PA #/Case ID/Reference #: [id number]

## 2024-04-16 NOTE — Telephone Encounter (Signed)
 Pharmacy Patient Advocate Encounter   Received notification from Pt Calls Messages that prior authorization for Advair  Diskus 500-50MCG/ACT aerosol powder  is required/requested.   Insurance verification completed.   The patient is insured through [facility] MEDICAID.   Per test claim: PA required; PA submitted to above mentioned insurance via Latent Key/confirmation #/EOC [id number] Status is pending

## 2024-04-19 ENCOUNTER — Encounter: Payer: Self-pay | Admitting: Pulmonary Disease

## 2024-04-19 ENCOUNTER — Telehealth (HOSPITAL_COMMUNITY): Payer: Self-pay

## 2024-04-19 ENCOUNTER — Other Ambulatory Visit: Payer: Self-pay | Admitting: Pharmacy Technician

## 2024-04-19 ENCOUNTER — Other Ambulatory Visit: Payer: Self-pay

## 2024-04-19 NOTE — Telephone Encounter (Signed)
 Called patient to schedule Pulmonary Rehab.  Patient stated that she was interested in doing rehab somewhere closer to [place] because that is where she lives.  Advised that I would fax referral over to [facility] and they would call her to schedule.

## 2024-04-19 NOTE — Progress Notes (Signed)
 Specialty Pharmacy Refill Coordination Note  [name] is a 43 y.o. female contacted today regarding refills of specialty medication(s) Dupilumab  (Dupixent )   Patient requested (Patient-Rptd) Delivery   Delivery date: 04/27/2024 Verified address: (Patient-Rptd) [address] [place] [address]   Medication will be filled on: 04/26/2024

## 2024-04-21 ENCOUNTER — Ambulatory Visit: Admitting: Physical Therapy

## 2024-04-22 ENCOUNTER — Telehealth: Payer: Self-pay

## 2024-04-22 NOTE — Telephone Encounter (Signed)
 Copied from CRM #[id number]. Topic: General - Other >> Apr 19, 2024  3:22 PM Russell PARAS wrote: Reason for CRM:   Pt is contacting clinic in regards to her MyChart message sent today.   She is requesting a order for adapter for her CPAP for her oxygen.  She would also like to know if she is to remain on oxygen from this point on  CB#  [phone] >> Apr 21, 2024 11:05 AM [name] wrote: Patient states she still hasn't heard back from anyone in regard to her message if she should still wear her oxygen.   Tried to reach out to patient , phone rang several times before e busy signal.   A referral to establish a new pulmonologist was sent on 04/01/24 Tatiana Louder, MD, [address] [place], phone [phone] - [facility].) like patient requested she needs to reach out to provider.    She has a f/u appointment with provider here will discuss such changes at OV

## 2024-04-24 ENCOUNTER — Other Ambulatory Visit (HOSPITAL_COMMUNITY): Payer: Self-pay

## 2024-04-26 ENCOUNTER — Other Ambulatory Visit: Payer: Self-pay

## 2024-04-26 NOTE — Telephone Encounter (Signed)
Pt has appt on 12/12

## 2024-04-29 ENCOUNTER — Other Ambulatory Visit: Payer: Self-pay

## 2024-04-30 ENCOUNTER — Ambulatory Visit: Admitting: Pulmonary Disease

## 2024-04-30 ENCOUNTER — Encounter: Payer: Self-pay | Admitting: Pulmonary Disease

## 2024-04-30 VITALS — BP 123/87 | HR 88 | Ht 64.0 in | Wt 217.0 lb

## 2024-04-30 DIAGNOSIS — J4489 Other specified chronic obstructive pulmonary disease: Secondary | ICD-10-CM

## 2024-04-30 DIAGNOSIS — J9601 Acute respiratory failure with hypoxia: Secondary | ICD-10-CM

## 2024-04-30 DIAGNOSIS — J4551 Severe persistent asthma with (acute) exacerbation: Secondary | ICD-10-CM

## 2024-04-30 MED ORDER — BREZTRI AEROSPHERE 160-9-4.8 MCG/ACT IN AERO: INHALATION_SPRAY | RESPIRATORY_TRACT | Status: AC

## 2024-04-30 NOTE — Progress Notes (Signed)
 I will be following up on this referral to see why she hasn't been contacted yet.

## 2024-04-30 NOTE — Patient Instructions (Signed)
 Nice to see you again  Samples of Breztri , I will send a message to your prior authorization for this  Will walk to see if we can get rid of the oxygen  I sent a message to our care coordinators regarding the referral.  This was faxed off about a month ago.  It is frustrating and I am sorry they have not contacted you for getting appointment locally in [place].  Return to clinic in 3 months, if you have established with a new lung doctor in [place], please cancel this visit, but want  to get something on the books just in case things still are up in the air

## 2024-04-30 NOTE — Progress Notes (Signed)
 I have called [facility] in [place]. It has been faxed to their location and patient will get contacted from their department to get scheduled.

## 2024-04-30 NOTE — Progress Notes (Signed)
 @Patient  ID: [name], female    DOB: 1980-09-07, 43 y.o.   MRN: [id number]  Chief Complaint  Patient presents with   Medical Management of Chronic Issues    Hosp.f/u pt states  CO2 - 20 days w/o smoking     Referring provider: Suanne Pfeiffer, NP  HPI:   43 y.o. 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  Multiple notes from our office in the interim since last visit with me reviewed.  Admitted to the hospital.  CO2 retention.  Presumed asthma or COPD exacerbation.  Treated with antibiotic steroids.  Was on BiPAP for a while.  She improved.  Discharged home oxygen.  She is quit smoking since then.  Overall much improved.  Back to baseline.  Has been monitoring off oxygen at home and oxygen saturations been acceptable.  Still has issues getting Breztri .  Currently using Advair  and Spiriva .  Despite this has been hospitalized now.  Really needs triple inhaled therapy via Breztri .  Questionaires / Pulmonary Flowsheets:   ACT:  Asthma Control Test ACT Total Score  08/19/2023 10:40 AM 12  03/27/2021  3:53 PM 12  07/15/2019  4:04 PM 11    MMRC:     No data to display          Epworth:      No data to display          Tests:   FENO:  Lab Results  Component Value Date   NITRICOXIDE 7 10/18/2015    PFT:    Latest Ref Rng & Units 11/16/2015   12:47 PM  PFT Results  FVC-Pre L 3.54   FVC-Predicted Pre % 94   Pre FEV1/FVC % % 55   FEV1-Pre L 1.96   FEV1-Predicted Pre % 62   DLCO uncorrected ml/min/mmHg 23.29   DLCO UNC% % 95   DLVA Predicted % 97   TLC L 8.11   TLC % Predicted % 160   RV % Predicted % 292   Personally viewed interpreted as moderate fixed obstruction, lung volumes with hyperinflation and air trapping, DLCO within normal limits  WALK:     04/30/2024   10:38 AM  SIX MIN WALK  Supplimental Oxygen during Test? (L/min) No   Tech Comments: patient was able to walk all threes laps without the need of O2.    Imaging: Personally reviewed No results found.   Lab Results: Personally reviewed CBC    Component Value Date/Time   WBC 13.6 (H) 02/09/2024 1521   RBC 4.63 02/09/2024 1521   HGB 13.5 02/09/2024 1521   HGB 16.1 (H) 08/19/2019 1426   HCT 43.2 02/09/2024 1521   HCT 48.6 (H) 08/19/2019 1426   PLT 374 02/09/2024 1521   PLT 347 08/19/2019 1426   MCV 93.3 02/09/2024 1521   MCV 90 08/19/2019 1426   MCH 29.2 02/09/2024 1521   MCHC 31.3 02/09/2024 1521   RDW 14.5 02/09/2024 1521   RDW 13.3 08/19/2019 1426   LYMPHSABS 1.3 03/26/2023 0844   MONOABS 0.7 03/26/2023 0844   EOSABS 0.4 03/26/2023 0844   BASOSABS 0.1 03/26/2023 0844    BMET    Component Value Date/Time   NA 137 02/09/2024 1521   NA 138 03/13/2020 1115   K 3.9 02/09/2024 1521   CL 101 02/09/2024 1521   CO2 20 (L) 02/09/2024 1521   GLUCOSE 161 (H) 02/09/2024 1521  BUN 12 02/09/2024 1521   BUN 9 03/13/2020 1115   CREATININE 1.25 (H) 02/09/2024 1521   CREATININE 1.09 10/31/2015 1506   CALCIUM  9.2 02/09/2024 1521   GFRNONAA 55 (L) 02/09/2024 1521   GFRNONAA 67 10/31/2015 1506   GFRAA 96 03/13/2020 1115   GFRAA 78 10/31/2015 1506    BNP No results found for: BNP  ProBNP No results found for: PROBNP  Specialty Problems       Pulmonary Problems   Asthma   Severe persistent asthma with acute exacerbation (HCC)   Asthma exacerbation in COPD (HCC)   Acute respiratory failure with hypoxia (HCC)   Allergic rhinitis    Allergies  Allergen Reactions   Alprazolam Itching   Oxycodone  Itching   Penicillins Nausea And Vomiting and Other (See Comments)    Has patient had a PCN reaction causing immediate rash, facial/tongue/throat swelling, SOB or lightheadedness with hypotension:No Has patient had a PCN reaction causing severe rash involving mucus membranes or skin necrosis:No Has patient had a PCN reaction that REACTION  THAT REQUIRED HOSPITALIZATION:  #  #  #  YES  #  #  #  Has patient had a PCN reaction occurring within the last 10 years: #  #  #  YES  #  #  #    Norvasc  [Amlodipine  Besylate] Other (See Comments)    Tip of tongue became numb and caused flushing    Immunization History  Administered Date(s) Administered   DT (Pediatric) 08/19/1984, 07/29/1985, 07/24/1987   DTP 03/28/1983   Hep B, Unspecified 02/26/1995, 04/03/1995, 09/03/1995   Influenza Inj Mdck Quad Pf 04/04/2021, 02/19/2022   Influenza, Mdck, Trivalent,PF 6+ MOS(egg free) 04/30/2023   Influenza,inj,Quad PF,6+ Mos 07/29/2014, 06/07/2015, 07/21/2017, 02/05/2018, 03/03/2019, 05/25/2022   Influenza-Unspecified 03/24/2012, 07/29/2014, 06/07/2015, 07/21/2017, 02/05/2018, 03/03/2019   MMR 06/19/1987   OPV 08/19/1984, 07/29/1985, 06/19/1987   PNEUMOCOCCAL CONJUGATE-20 01/04/2023   Pneumococcal Polysaccharide-23 07/29/2014, 06/08/2015   Pneumococcal-Unspecified 07/29/2014, 06/08/2015   Tdap 11/20/2011, 06/07/2015    Past Medical History:  Diagnosis Date   Anemia    Anxiety    Arthritis    Asthma    Complication of anesthesia    during foot surgery in 2023 went into repiratory distress   COPD (chronic obstructive pulmonary disease) (HCC)    Depression    doing good, not on meds now   GERD (gastroesophageal reflux disease)    Headache    migraines   History of kidney stones    Hypertension    Kidney stones    Missed abortion 10/02/2013   Ovarian cyst    Pneumonia    Pregnancy induced hypertension    Suicidal intent    Vaginal Pap smear, abnormal    bx, f/u ok    Tobacco History: Social History   Tobacco Use  Smoking Status Every Day   Current packs/day: 1.00   Average packs/day: 1 pack/day for 15.0 years (15.0 ttl pk-yrs)   Types: Cigarettes  Smokeless Tobacco Never  Tobacco Comments   Quit smoking 10/06/23    Ready to quit: Not Answered Counseling given: Not Answered Tobacco comments: Quit smoking 10/06/23     Continue to not smoke  Outpatient Encounter Medications as of 04/30/2024  Medication Sig   albuterol  (PROVENTIL ) (2.5 MG/3ML) 0.083% nebulizer solution Take 3 mLs (2.5 mg total) by nebulization every 6 (six) hours as needed for wheezing or shortness of breath.   albuterol  (VENTOLIN  HFA) 108 (90 Base) MCG/ACT inhaler Inhale 2 puffs into the lungs every  6 (six) hours as needed for wheezing or shortness of breath.   albuterol  (VENTOLIN  HFA) 108 (90 Base) MCG/ACT inhaler Inhale 2 puffs into the lungs every 6 (six) hours as needed for wheezing or shortness of breath.   Azelastine -Fluticasone  137-50 MCG/ACT SUSP Place 1 spray into the nose in the morning and at bedtime.   budesonide  (PULMICORT ) 0.25 MG/2ML nebulizer solution Take 2 mLs (0.25 mg total) by nebulization daily.   busPIRone  (BUSPAR ) 10 MG tablet Take 10 mg by mouth 3 (three) times daily.   ciprofloxacin  (CIPRO ) 250 MG tablet Take 1 tablet (250 mg total) by mouth 2 (two) times daily.   clonazePAM  (KLONOPIN ) 0.5 MG tablet Take 0.5 mg by mouth 2 (two) times daily as needed.   doxycycline  (VIBRAMYCIN ) 100 MG capsule Take 1 capsule (100 mg total) by mouth 2 (two) times daily.   Dupilumab  (DUPIXENT ) 300 MG/2ML SOAJ Inject 300 mg into the skin every 14 (fourteen) days.   fluticasone -salmeterol (ADVAIR  DISKUS) 500-50 MCG/ACT AEPB Inhale 1 puff into the lungs in the morning and at bedtime.   folic acid  (FOLVITE ) 1 MG tablet Take 1 mg by mouth daily.   gabapentin  (NEURONTIN ) 300 MG capsule Take 300 mg by mouth 3 (three) times daily.   hydrOXYzine  (VISTARIL ) 25 MG capsule Take 1 capsule (25 mg total) by mouth 3 (three) times daily as needed for anxiety.   levocetirizine (XYZAL ) 5 MG tablet Take 1 tablet (5 mg total) by mouth every evening.   losartan  (COZAAR ) 50 MG tablet Take 50 mg by mouth daily.   meclizine  (ANTIVERT ) 12.5 MG tablet Take 1 tablet (12.5 mg total) by mouth 3 (three) times daily as needed for dizziness.   montelukast   (SINGULAIR ) 10 MG tablet Take 1 tablet (10 mg total) by mouth daily.   Nebulizers (COMPRESSOR/NEBULIZER) MISC Use as directed   ondansetron  (ZOFRAN ) 4 MG tablet Take 1 tablet (4 mg total) by mouth every 8 (eight) hours as needed for nausea or vomiting.   ondansetron  (ZOFRAN -ODT) 4 MG disintegrating tablet Take 4 mg by mouth daily as needed.   OXTELLAR XR 300 MG TB24 Take 1 tablet by mouth daily.   pantoprazole  (PROTONIX ) 40 MG tablet Take 1 tablet (40 mg total) by mouth 2 (two) times daily.   pantoprazole  (PROTONIX ) 40 MG tablet Take 1 tablet (40 mg total) by mouth daily.   REXULTI 1 MG TABS tablet Take 1 mg by mouth daily.   Tiotropium Bromide  Monohydrate (SPIRIVA  RESPIMAT) 1.25 MCG/ACT AERS Inhale 2 puffs into the lungs daily.   tiZANidine (ZANAFLEX) 4 MG tablet Take 4 mg by mouth every 8 (eight) hours as needed for muscle spasms.   traMADol  (ULTRAM ) 50 MG tablet Take 1 tablet (50 mg total) by mouth every 6 (six) hours as needed.   TRINTELLIX 10 MG TABS tablet Take 10 mg by mouth daily.   WEGOVY 0.5 MG/0.5ML SOAJ Inject 0.5 mg into the skin once a week.   No facility-administered encounter medications on file as of 04/30/2024.     Review of Systems  Review of Systems  N/a Physical Exam  BP 123/87   Pulse 88   Ht 5' 4 (1.626 m) Comment: per pt  Wt 217 lb (98.4 kg)   SpO2 98%   PF (!) 3 L/min   BMI 37.25 kg/m   Wt Readings from Last 5 Encounters:  04/30/24 217 lb (98.4 kg)  03/03/24 214 lb (97.1 kg)  02/15/24 210 lb (95.3 kg)  01/20/24 211 lb 6.4 oz (95.9  kg)  01/07/24 203 lb (92.1 kg)    BMI Readings from Last 5 Encounters:  04/30/24 37.25 kg/m  03/03/24 36.73 kg/m  02/15/24 36.05 kg/m  01/20/24 36.29 kg/m  01/07/24 34.84 kg/m     Physical Exam General: Sitting in chair, nasal cannula in place Eyes: No icterus Neck: No JVP Pulmonary: Decreased excursion, no wheeze, normal work of breathing on oxygen Cardiovascular warm Abdomen: Not  distended  Assessment & Plan:   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  Continue Dupixent .  Persistent wheezing: Related to asthma.  CT scan to evaluate for tracheobronchomalacia was negative for significant bronchial or tracheal narrowing with expiration.  Acute hypoxemic respiratory failure: Related to COPD/asthma exacerbation.  Ambulated around clinic today with no oxygen.  No significant desaturations.  Order to discontinue oxygen.   Referral has been sent to local pulmonologist in [place].  She has not heard back from them.  Will message [facility] to assist in coordinating.  Return in about 3 months (around 07/29/2024) for f/u Dr. Annella.   Donnice JONELLE Annella, MD 04/30/2024  I spent 41 minutes in the care of the patient including face to face visit, review of records, coordination of care.

## 2024-05-03 ENCOUNTER — Other Ambulatory Visit: Payer: Self-pay

## 2024-05-03 NOTE — Progress Notes (Signed)
 Specialty Pharmacy Ongoing Clinical Assessment Note  [name] is a 43 y.o. female who is being followed by the specialty pharmacy service for RxSp Asthma/COPD   Patient's specialty medication(s) reviewed today: Dupilumab  (Dupixent )   Missed doses in the last 4 weeks: 0   Patient/Caregiver did not have any additional questions or concerns.   Therapeutic benefit summary: Patient is achieving benefit   Adverse events/side effects summary: No adverse events/side effects   Patient's therapy is appropriate to: Continue    Goals Addressed             This Visit's Progress    Maintain optimal adherence to therapy   On track    Patient is on track. Patient will maintain adherence and be monitored by provider to determine if a change in treatment plan is warranted. Patient reports that she is trying to get in with a pulmonology clinic in [place] and plans to ask about changing therapy as the effects of the Dupixent  doesn't seem to last as long as she hoped.          Follow up: 12 months  [facility]

## 2024-05-04 ENCOUNTER — Telehealth: Payer: Self-pay

## 2024-05-04 NOTE — Telephone Encounter (Signed)
 Pt called and stated that she wants her Heprin script to be sent to [facility] on [address].

## 2024-05-05 ENCOUNTER — Telehealth: Payer: Self-pay

## 2024-05-05 NOTE — Telephone Encounter (Signed)
 Copied from CRM #[id number]. Topic: Clinical - Medication Prior Auth >> May 05, 2024  1:49 PM Ismael A wrote: Reason for CRM: pt is calling stating pharmacy is stating that they need a prior auth from both pcp and pulmonologist for Zepbound RX   Called and spoke to pt - advised that I would forward this to our prior authorization team. Pt verbalized understanding.

## 2024-05-06 NOTE — Telephone Encounter (Signed)
 Called pt and advised of Rx PA team's response. Pt verbalized understanding, NFN.

## 2024-05-17 ENCOUNTER — Other Ambulatory Visit: Payer: Self-pay

## 2024-05-19 ENCOUNTER — Other Ambulatory Visit (HOSPITAL_COMMUNITY): Payer: Self-pay

## 2024-05-21 ENCOUNTER — Other Ambulatory Visit (HOSPITAL_COMMUNITY): Payer: Self-pay

## 2024-06-10 ENCOUNTER — Telehealth: Payer: Self-pay

## 2024-06-10 DIAGNOSIS — J4489 Other specified chronic obstructive pulmonary disease: Secondary | ICD-10-CM

## 2024-06-10 NOTE — Telephone Encounter (Unsigned)
 Copied from CRM #[id number]. Topic: Clinical - Medication Refill >> Jun 09, 2024  4:14 PM Rozanna MATSU wrote: Medication: Rx #: [id number]  Dupilumab  (DUPIXENT ) 300 MG/2ML SOAJ    Has the patient contacted their pharmacy? No (Agent: If no, request that the patient contact the pharmacy for the refill. If patient does not wish to contact the pharmacy document the reason why and proceed with request.) (Agent: If yes, when and what did the pharmacy advise?)  This is the patient's preferred pharmacy:  [place] [facility] [address] [place] [address] Phone: [phone] Fax: [phone]  Is this the correct pharmacy for this prescription? Yes If no, delete pharmacy and type the correct one.   Has the prescription been filled recently? Yes  Is the patient out of the medication? Yes  Has the patient been seen for an appointment in the last year OR does the patient have an upcoming appointment? Yes  Can we respond through MyChart? Yes  Agent: Please be advised that Rx refills may take up to 3 business days. We ask that you follow-up with your pharmacy.

## 2024-06-11 NOTE — Addendum Note (Signed)
 Addended by: DAYNE SHERRY RAMAN on: 06/11/2024 05:43 PM   Modules accepted: Orders

## 2024-06-11 NOTE — Telephone Encounter (Signed)
 Referral placed for pharmoctherapy clinic for [facility] to complete onboaridng

## 2024-06-21 ENCOUNTER — Other Ambulatory Visit: Payer: Self-pay

## 2024-06-21 ENCOUNTER — Other Ambulatory Visit (HOSPITAL_COMMUNITY): Payer: Self-pay

## 2024-06-22 ENCOUNTER — Other Ambulatory Visit: Payer: Self-pay

## 2024-06-24 ENCOUNTER — Telehealth: Payer: Self-pay

## 2024-06-24 ENCOUNTER — Encounter: Payer: Self-pay | Admitting: Pulmonary Disease

## 2024-06-24 DIAGNOSIS — J4551 Severe persistent asthma with (acute) exacerbation: Secondary | ICD-10-CM

## 2024-06-24 MED ORDER — DOXYCYCLINE HYCLATE 100 MG PO TABS: 100.0000 mg | ORAL_TABLET | Freq: Two times a day (BID) | ORAL | 0 refills | Status: AC

## 2024-06-24 MED ORDER — FLUTICASONE-SALMETEROL 500-50 MCG/ACT IN AEPB: 1.0000 | INHALATION_SPRAY | Freq: Two times a day (BID) | RESPIRATORY_TRACT | 2 refills | Status: AC

## 2024-06-24 MED ORDER — PREDNISONE 20 MG PO TABS: ORAL_TABLET | ORAL | 0 refills | Status: AC

## 2024-06-24 NOTE — Telephone Encounter (Signed)
 Prednisone  taper sent.  Prednisone  taper sent.  Can you see if she is using the Wixela and Spiriva ?  We can set different inhalers if needed if Breztri  still is not improved.  Does she need any rescue medications, inhaler or nebulizer?

## 2024-06-24 NOTE — Addendum Note (Signed)
 Addended byBETHA ANNELLA KID on: 06/24/2024 04:24 PM   Modules accepted: Orders

## 2024-06-24 NOTE — Telephone Encounter (Signed)
 Copied from CRM #[id number]. Topic: Clinical - Medication Question >> Jun 24, 2024  2:20 PM [name] wrote: Reason for CRM: Patient calling to have MyChart message read to her. Relayed message verbatim.  Looks like this was relayed to patient

## 2024-06-24 NOTE — Telephone Encounter (Signed)
 Please advise.

## 2024-06-24 NOTE — Telephone Encounter (Signed)
 Please advise-told patient it was sent to correct pharmacy and that we do not mail in sample inhalers,patient is asking for a course of antibiotics   Can you send an antibiotic again, Im gonna be so swollen from the prednisone . But its ok. Ive never use wixel. Just ran out of Bretzi samples. Can you mail those to me?

## 2024-06-24 NOTE — Addendum Note (Signed)
 Addended byBETHA ANNELLA KID on: 06/24/2024 11:40 AM   Modules accepted: Orders

## 2024-06-25 ENCOUNTER — Encounter: Payer: Self-pay | Admitting: Pulmonary Disease

## 2024-06-25 NOTE — Telephone Encounter (Signed)
 Please advise should pt reach out for PCP appt or do you want to advise
